# Patient Record
Sex: Female | Born: 1945
Health system: Southern US, Community
[De-identification: ages and names within clinical notes are randomized; demographics above are authoritative.]

## PROBLEM LIST (undated history)

## (undated) DIAGNOSIS — Z9889 Other specified postprocedural states: Secondary | ICD-10-CM

## (undated) DIAGNOSIS — E785 Hyperlipidemia, unspecified: Secondary | ICD-10-CM

## (undated) DIAGNOSIS — E119 Type 2 diabetes mellitus without complications: Secondary | ICD-10-CM

## (undated) DIAGNOSIS — I5032 Chronic diastolic (congestive) heart failure: Secondary | ICD-10-CM

## (undated) DIAGNOSIS — M1990 Unspecified osteoarthritis, unspecified site: Secondary | ICD-10-CM

## (undated) DIAGNOSIS — Z9089 Acquired absence of other organs: Secondary | ICD-10-CM

## (undated) DIAGNOSIS — J9610 Chronic respiratory failure, unspecified whether with hypoxia or hypercapnia: Secondary | ICD-10-CM

## (undated) DIAGNOSIS — G4730 Sleep apnea, unspecified: Secondary | ICD-10-CM

## (undated) DIAGNOSIS — R7302 Impaired glucose tolerance (oral): Secondary | ICD-10-CM

## (undated) DIAGNOSIS — E669 Obesity, unspecified: Secondary | ICD-10-CM

## (undated) DIAGNOSIS — C4490 Unspecified malignant neoplasm of skin, unspecified: Secondary | ICD-10-CM

## (undated) DIAGNOSIS — H269 Unspecified cataract: Secondary | ICD-10-CM

## (undated) DIAGNOSIS — I10 Essential (primary) hypertension: Secondary | ICD-10-CM

## (undated) DIAGNOSIS — I428 Other cardiomyopathies: Secondary | ICD-10-CM

## (undated) DIAGNOSIS — K5790 Diverticulosis of intestine, part unspecified, without perforation or abscess without bleeding: Secondary | ICD-10-CM

## (undated) DIAGNOSIS — J984 Other disorders of lung: Secondary | ICD-10-CM

## (undated) DIAGNOSIS — Z8619 Personal history of other infectious and parasitic diseases: Secondary | ICD-10-CM

## (undated) DIAGNOSIS — R112 Nausea with vomiting, unspecified: Secondary | ICD-10-CM

## (undated) HISTORY — PX: BREAST EXCISIONAL BIOPSY: SUR124

## (undated) HISTORY — DX: Unspecified malignant neoplasm of skin, unspecified: C44.90

## (undated) HISTORY — PX: BREAST SURGERY: SHX581

## (undated) HISTORY — DX: Chronic diastolic (congestive) heart failure: I50.32

## (undated) HISTORY — DX: Other cardiomyopathies: I42.8

## (undated) HISTORY — DX: Chronic respiratory failure, unspecified whether with hypoxia or hypercapnia: J96.10

## (undated) HISTORY — PX: CHOLECYSTECTOMY: SHX55

## (undated) HISTORY — DX: Hyperlipidemia, unspecified: E78.5

## (undated) HISTORY — DX: Impaired glucose tolerance (oral): R73.02

## (undated) HISTORY — DX: Other disorders of lung: J98.4

## (undated) HISTORY — PX: BILATERAL VATS ABLATION: SHX1224

## (undated) HISTORY — DX: Obesity, unspecified: E66.9

## (undated) HISTORY — PX: OTHER SURGICAL HISTORY: SHX169

## (undated) HISTORY — DX: Acquired absence of other organs: Z90.89

## (undated) HISTORY — PX: INDUCED ABORTION: SHX677

## (undated) HISTORY — PX: CARDIAC CATHETERIZATION: SHX172

## (undated) HISTORY — DX: Personal history of other infectious and parasitic diseases: Z86.19

---

## 1973-08-27 HISTORY — PX: TUBAL LIGATION: SHX77

## 1994-08-27 HISTORY — PX: CYSTECTOMY: SUR359

## 1999-05-17 ENCOUNTER — Other Ambulatory Visit: Admission: RE | Admit: 1999-05-17 | Discharge: 1999-05-17 | Payer: Self-pay | Admitting: Family Medicine

## 1999-05-17 ENCOUNTER — Encounter: Payer: Self-pay | Admitting: Family Medicine

## 1999-05-17 LAB — CONVERTED CEMR LAB
Blood Glucose, Fasting: 93 mg/dL
RBC count: 4.36 10*6/uL

## 2000-04-23 ENCOUNTER — Encounter: Payer: Self-pay | Admitting: Family Medicine

## 2000-04-30 ENCOUNTER — Other Ambulatory Visit: Admission: RE | Admit: 2000-04-30 | Discharge: 2000-04-30 | Payer: Self-pay | Admitting: Family Medicine

## 2000-04-30 ENCOUNTER — Encounter: Payer: Self-pay | Admitting: Family Medicine

## 2000-08-27 DIAGNOSIS — I428 Other cardiomyopathies: Secondary | ICD-10-CM

## 2000-08-27 HISTORY — DX: Other cardiomyopathies: I42.8

## 2000-10-22 ENCOUNTER — Encounter: Payer: Self-pay | Admitting: Family Medicine

## 2001-08-12 ENCOUNTER — Encounter: Payer: Self-pay | Admitting: Family Medicine

## 2001-08-18 ENCOUNTER — Other Ambulatory Visit: Admission: RE | Admit: 2001-08-18 | Discharge: 2001-08-18 | Payer: Self-pay | Admitting: Family Medicine

## 2001-08-18 ENCOUNTER — Encounter: Payer: Self-pay | Admitting: Family Medicine

## 2001-08-18 LAB — CONVERTED CEMR LAB: Pap Smear: NORMAL

## 2001-11-04 ENCOUNTER — Ambulatory Visit (HOSPITAL_COMMUNITY): Admission: RE | Admit: 2001-11-04 | Discharge: 2001-11-04 | Payer: Self-pay | Admitting: Cardiology

## 2002-02-24 HISTORY — PX: OTHER SURGICAL HISTORY: SHX169

## 2002-08-21 ENCOUNTER — Encounter: Payer: Self-pay | Admitting: Family Medicine

## 2002-08-21 LAB — CONVERTED CEMR LAB
Blood Glucose, Fasting: 101 mg/dL
TSH: 3.73 microintl units/mL
WBC, blood: 7.3 10*3/uL

## 2002-08-25 ENCOUNTER — Other Ambulatory Visit: Admission: RE | Admit: 2002-08-25 | Discharge: 2002-08-25 | Payer: Self-pay | Admitting: Family Medicine

## 2002-08-25 ENCOUNTER — Encounter: Payer: Self-pay | Admitting: Family Medicine

## 2002-08-25 LAB — CONVERTED CEMR LAB: Pap Smear: NORMAL

## 2002-11-18 ENCOUNTER — Encounter: Payer: Self-pay | Admitting: Family Medicine

## 2002-11-18 LAB — CONVERTED CEMR LAB
Blood Glucose, Fasting: 87 mg/dL
TSH: 3.47 microintl units/mL

## 2003-02-19 ENCOUNTER — Encounter: Payer: Self-pay | Admitting: Family Medicine

## 2003-02-19 LAB — CONVERTED CEMR LAB: Blood Glucose, Fasting: 93 mg/dL

## 2003-08-27 ENCOUNTER — Encounter: Payer: Self-pay | Admitting: Family Medicine

## 2003-08-27 LAB — CONVERTED CEMR LAB
Blood Glucose, Fasting: 90 mg/dL
TSH: 5 microintl units/mL
WBC, blood: 7.4 10*3/uL

## 2003-09-13 ENCOUNTER — Encounter: Payer: Self-pay | Admitting: Family Medicine

## 2003-09-13 ENCOUNTER — Other Ambulatory Visit: Admission: RE | Admit: 2003-09-13 | Discharge: 2003-09-13 | Payer: Self-pay | Admitting: Family Medicine

## 2004-08-08 ENCOUNTER — Ambulatory Visit: Payer: Self-pay | Admitting: Family Medicine

## 2004-08-09 ENCOUNTER — Ambulatory Visit: Payer: Self-pay | Admitting: Family Medicine

## 2004-11-16 ENCOUNTER — Ambulatory Visit: Payer: Self-pay | Admitting: Family Medicine

## 2004-11-16 LAB — CONVERTED CEMR LAB
Blood Glucose, Fasting: 96 mg/dL
Hgb A1c MFr Bld: 5.14 %

## 2004-11-20 ENCOUNTER — Other Ambulatory Visit: Admission: RE | Admit: 2004-11-20 | Discharge: 2004-11-20 | Payer: Self-pay | Admitting: Family Medicine

## 2004-11-20 ENCOUNTER — Ambulatory Visit: Payer: Self-pay | Admitting: Family Medicine

## 2004-12-04 ENCOUNTER — Ambulatory Visit: Payer: Self-pay

## 2004-12-06 ENCOUNTER — Ambulatory Visit: Payer: Self-pay | Admitting: Family Medicine

## 2005-06-28 ENCOUNTER — Ambulatory Visit: Payer: Self-pay | Admitting: Family Medicine

## 2005-08-10 ENCOUNTER — Ambulatory Visit: Payer: Self-pay | Admitting: Family Medicine

## 2005-08-29 ENCOUNTER — Ambulatory Visit: Payer: Self-pay | Admitting: Family Medicine

## 2005-11-13 ENCOUNTER — Encounter: Payer: Self-pay | Admitting: Family Medicine

## 2005-11-13 LAB — CONVERTED CEMR LAB: Pap Smear: NORMAL

## 2005-11-21 ENCOUNTER — Ambulatory Visit: Payer: Self-pay | Admitting: Family Medicine

## 2005-11-21 LAB — CONVERTED CEMR LAB
RBC count: 3.86 10*6/uL
TSH: 3.09 microintl units/mL

## 2005-11-23 ENCOUNTER — Other Ambulatory Visit: Admission: RE | Admit: 2005-11-23 | Discharge: 2005-11-23 | Payer: Self-pay | Admitting: Family Medicine

## 2005-11-23 ENCOUNTER — Ambulatory Visit: Payer: Self-pay | Admitting: Family Medicine

## 2005-11-23 ENCOUNTER — Encounter: Payer: Self-pay | Admitting: Family Medicine

## 2005-12-06 ENCOUNTER — Ambulatory Visit: Payer: Self-pay | Admitting: Family Medicine

## 2005-12-24 ENCOUNTER — Ambulatory Visit: Payer: Self-pay | Admitting: Internal Medicine

## 2006-04-23 ENCOUNTER — Ambulatory Visit: Payer: Self-pay | Admitting: Family Medicine

## 2006-08-22 ENCOUNTER — Ambulatory Visit: Payer: Self-pay | Admitting: Family Medicine

## 2006-09-18 ENCOUNTER — Ambulatory Visit: Payer: Self-pay | Admitting: Family Medicine

## 2006-11-27 ENCOUNTER — Ambulatory Visit: Payer: Self-pay | Admitting: Family Medicine

## 2006-11-27 LAB — CONVERTED CEMR LAB
ALT: 28 units/L (ref 0–40)
Basophils Absolute: 0 10*3/uL (ref 0.0–0.1)
Basophils Relative: 0 % (ref 0.0–1.0)
Blood Glucose, Fasting: 93 mg/dL
CO2: 32 meq/L (ref 19–32)
Calcium: 9.5 mg/dL (ref 8.4–10.5)
Chloride: 105 meq/L (ref 96–112)
Cholesterol: 147 mg/dL (ref 0–200)
Eosinophils Absolute: 0.2 10*3/uL (ref 0.0–0.6)
GFR calc Af Amer: 94 mL/min
Glucose, Bld: 93 mg/dL (ref 70–99)
HDL: 54.9 mg/dL (ref >39.0)
LDL Cholesterol: 54 mg/dL (ref 0–99)
Lymphocytes Relative: 31.4 % (ref 12.0–46.0)
MCHC: 35.6 g/dL (ref 30.0–36.0)
MCV: 93.9 fL (ref 78.0–100.0)
Monocytes Relative: 9 % (ref 3.0–11.0)
Neutrophils Relative %: 57.5 % (ref 43.0–77.0)
Platelets: 298 10*3/uL (ref 150–400)
RDW: 11.9 % (ref 11.5–14.6)
Sodium: 144 meq/L (ref 135–145)
TSH: 4.77 microintl units/mL
TSH: 4.77 microintl units/mL (ref 0.35–5.50)
Total Protein: 7.4 g/dL (ref 6.0–8.3)
Triglycerides: 192 mg/dL — ABNORMAL HIGH (ref 0–149)
VLDL: 38 mg/dL (ref 0–40)

## 2006-12-02 ENCOUNTER — Other Ambulatory Visit: Admission: RE | Admit: 2006-12-02 | Discharge: 2006-12-02 | Payer: Self-pay | Admitting: Family Medicine

## 2006-12-02 ENCOUNTER — Ambulatory Visit: Payer: Self-pay | Admitting: Family Medicine

## 2006-12-02 ENCOUNTER — Encounter: Payer: Self-pay | Admitting: Family Medicine

## 2006-12-09 ENCOUNTER — Ambulatory Visit: Payer: Self-pay | Admitting: Internal Medicine

## 2006-12-16 ENCOUNTER — Encounter: Payer: Self-pay | Admitting: Cardiovascular Disease

## 2006-12-16 ENCOUNTER — Ambulatory Visit: Payer: Self-pay

## 2006-12-20 ENCOUNTER — Ambulatory Visit: Payer: Self-pay | Admitting: Family Medicine

## 2007-05-02 ENCOUNTER — Ambulatory Visit: Payer: Self-pay | Admitting: Family Medicine

## 2007-05-02 DIAGNOSIS — E782 Mixed hyperlipidemia: Secondary | ICD-10-CM | POA: Insufficient documentation

## 2007-05-03 LAB — CONVERTED CEMR LAB
Cholesterol: 164 mg/dL (ref 0–200)
HDL: 60 mg/dL (ref >39.0)

## 2007-07-11 ENCOUNTER — Ambulatory Visit: Payer: Self-pay | Admitting: Family Medicine

## 2007-08-25 ENCOUNTER — Ambulatory Visit: Payer: Self-pay | Admitting: Family Medicine

## 2007-08-27 ENCOUNTER — Encounter: Payer: Self-pay | Admitting: Family Medicine

## 2007-08-27 ENCOUNTER — Encounter (INDEPENDENT_AMBULATORY_CARE_PROVIDER_SITE_OTHER): Payer: Self-pay | Admitting: *Deleted

## 2007-10-03 ENCOUNTER — Ambulatory Visit: Payer: Self-pay | Admitting: Family Medicine

## 2007-10-06 ENCOUNTER — Encounter: Payer: Self-pay | Admitting: Family Medicine

## 2007-10-08 ENCOUNTER — Encounter: Payer: Self-pay | Admitting: Family Medicine

## 2007-10-08 DIAGNOSIS — J301 Allergic rhinitis due to pollen: Secondary | ICD-10-CM | POA: Insufficient documentation

## 2007-10-08 DIAGNOSIS — Z9089 Acquired absence of other organs: Secondary | ICD-10-CM | POA: Insufficient documentation

## 2007-10-08 DIAGNOSIS — I428 Other cardiomyopathies: Secondary | ICD-10-CM | POA: Insufficient documentation

## 2007-10-08 HISTORY — DX: Acquired absence of other organs: Z90.89

## 2007-12-03 ENCOUNTER — Ambulatory Visit: Payer: Self-pay | Admitting: Family Medicine

## 2007-12-03 LAB — CONVERTED CEMR LAB
ALT: 29 units/L (ref 0–35)
AST: 27 units/L (ref 0–37)
Alkaline Phosphatase: 69 units/L (ref 39–117)
BUN: 16 mg/dL (ref 6–23)
Calcium: 9.4 mg/dL (ref 8.4–10.5)
Chloride: 104 meq/L (ref 96–112)
Direct LDL: 77.8 mg/dL
Eosinophils Relative: 2.3 % (ref 0.0–5.0)
Glucose, Bld: 98 mg/dL (ref 70–99)
LDL Cholesterol: 62 mg/dL (ref 0–99)
MCHC: 34.9 g/dL (ref 30.0–36.0)
Monocytes Absolute: 0.7 10*3/uL (ref 0.1–1.0)
Monocytes Relative: 10 % (ref 3.0–12.0)
Neutrophils Relative %: 52.9 % (ref 43.0–77.0)
Platelets: 235 10*3/uL (ref 150–400)
RBC: 4.03 M/uL (ref 3.87–5.11)
RDW: 11.9 % (ref 11.5–14.6)
TSH: 5.29 microintl units/mL (ref 0.35–5.50)
Total Protein: 7.5 g/dL (ref 6.0–8.3)
Triglycerides: 245 mg/dL (ref 0–149)

## 2007-12-08 ENCOUNTER — Other Ambulatory Visit: Admission: RE | Admit: 2007-12-08 | Discharge: 2007-12-08 | Payer: Self-pay | Admitting: Family Medicine

## 2007-12-08 ENCOUNTER — Ambulatory Visit: Payer: Self-pay | Admitting: Family Medicine

## 2007-12-08 ENCOUNTER — Encounter: Payer: Self-pay | Admitting: Family Medicine

## 2007-12-16 ENCOUNTER — Encounter (INDEPENDENT_AMBULATORY_CARE_PROVIDER_SITE_OTHER): Payer: Self-pay | Admitting: *Deleted

## 2007-12-29 ENCOUNTER — Ambulatory Visit: Payer: Self-pay | Admitting: Internal Medicine

## 2008-01-05 ENCOUNTER — Ambulatory Visit: Payer: Self-pay | Admitting: Family Medicine

## 2008-01-05 LAB — CONVERTED CEMR LAB: OCCULT 1: NEGATIVE

## 2008-01-06 ENCOUNTER — Encounter (INDEPENDENT_AMBULATORY_CARE_PROVIDER_SITE_OTHER): Payer: Self-pay | Admitting: *Deleted

## 2008-04-23 ENCOUNTER — Ambulatory Visit: Payer: Self-pay | Admitting: Internal Medicine

## 2008-06-07 ENCOUNTER — Ambulatory Visit: Payer: Self-pay | Admitting: Family Medicine

## 2008-07-30 ENCOUNTER — Telehealth: Payer: Self-pay | Admitting: Family Medicine

## 2008-09-01 ENCOUNTER — Ambulatory Visit: Payer: Self-pay | Admitting: Family Medicine

## 2008-09-01 LAB — CONVERTED CEMR LAB
ALT: 26 units/L (ref 0–35)
AST: 26 units/L (ref 0–37)
Alkaline Phosphatase: 80 units/L (ref 39–117)
Basophils Absolute: 0.1 10*3/uL (ref 0.0–0.1)
Bilirubin, Direct: 0.1 mg/dL (ref 0.0–0.3)
Calcium: 9.4 mg/dL (ref 8.4–10.5)
Chloride: 102 meq/L (ref 96–112)
Creatinine, Ser: 0.6 mg/dL (ref 0.4–1.2)
Eosinophils Absolute: 0.2 10*3/uL (ref 0.0–0.7)
Eosinophils Relative: 2.4 % (ref 0.0–5.0)
MCHC: 34.7 g/dL (ref 30.0–36.0)
Monocytes Absolute: 0.7 10*3/uL (ref 0.1–1.0)
Neutro Abs: 4.4 10*3/uL (ref 1.4–7.7)
Platelets: 254 10*3/uL (ref 150–400)
Sodium: 140 meq/L (ref 135–145)
Total Bilirubin: 0.7 mg/dL (ref 0.3–1.2)
Total CHOL/HDL Ratio: 3.1
Triglycerides: 276 mg/dL (ref 0–149)
Uric Acid, Serum: 8 mg/dL — ABNORMAL HIGH (ref 2.4–7.0)

## 2008-09-02 LAB — CONVERTED CEMR LAB: Vit D, 1,25-Dihydroxy: 28 — ABNORMAL LOW (ref 30–89)

## 2008-09-07 ENCOUNTER — Encounter: Payer: Self-pay | Admitting: Family Medicine

## 2008-09-07 ENCOUNTER — Ambulatory Visit: Payer: Self-pay | Admitting: Family Medicine

## 2008-09-08 ENCOUNTER — Ambulatory Visit: Payer: Self-pay | Admitting: Family Medicine

## 2008-09-08 DIAGNOSIS — R7309 Other abnormal glucose: Secondary | ICD-10-CM | POA: Insufficient documentation

## 2008-09-13 ENCOUNTER — Encounter (INDEPENDENT_AMBULATORY_CARE_PROVIDER_SITE_OTHER): Payer: Self-pay | Admitting: *Deleted

## 2008-12-13 ENCOUNTER — Ambulatory Visit: Payer: Self-pay | Admitting: Family Medicine

## 2008-12-13 LAB — CONVERTED CEMR LAB
Albumin: 3.7 g/dL (ref 3.5–5.2)
CO2: 31 meq/L (ref 19–32)
Calcium: 9.4 mg/dL (ref 8.4–10.5)
Cholesterol: 182 mg/dL (ref 0–200)
Creatinine, Ser: 0.6 mg/dL (ref 0.4–1.2)
Eosinophils Absolute: 0.2 10*3/uL (ref 0.0–0.7)
Eosinophils Relative: 2.6 % (ref 0.0–5.0)
Glucose, Bld: 109 mg/dL — ABNORMAL HIGH (ref 70–99)
HDL: 60.8 mg/dL (ref >39.00)
Lymphocytes Relative: 32.2 % (ref 12.0–46.0)
Lymphs Abs: 2.2 10*3/uL (ref 0.7–4.0)
MCHC: 34.8 g/dL (ref 30.0–36.0)
Microalb Creat Ratio: 2.9 mg/g (ref 0.0–30.0)
Microalb, Ur: 0.1 mg/dL (ref 0.0–1.9)
Monocytes Relative: 9 % (ref 3.0–12.0)
Potassium: 4.3 meq/L (ref 3.5–5.1)
Sodium: 143 meq/L (ref 135–145)
TSH: 4.61 microintl units/mL (ref 0.35–5.50)
Total Bilirubin: 0.6 mg/dL (ref 0.3–1.2)
Total CHOL/HDL Ratio: 3
Triglycerides: 235 mg/dL — ABNORMAL HIGH (ref 0.0–149.0)

## 2008-12-15 ENCOUNTER — Ambulatory Visit: Payer: Self-pay | Admitting: Family Medicine

## 2008-12-15 ENCOUNTER — Other Ambulatory Visit: Admission: RE | Admit: 2008-12-15 | Discharge: 2008-12-15 | Payer: Self-pay | Admitting: Family Medicine

## 2008-12-15 ENCOUNTER — Encounter: Payer: Self-pay | Admitting: Family Medicine

## 2008-12-15 LAB — CONVERTED CEMR LAB: Vit D, 25-Hydroxy: 37 ng/mL (ref 30–89)

## 2008-12-16 ENCOUNTER — Telehealth: Payer: Self-pay | Admitting: Family Medicine

## 2008-12-20 ENCOUNTER — Encounter (INDEPENDENT_AMBULATORY_CARE_PROVIDER_SITE_OTHER): Payer: Self-pay | Admitting: *Deleted

## 2008-12-28 ENCOUNTER — Ambulatory Visit: Payer: Self-pay | Admitting: Family Medicine

## 2008-12-28 LAB — CONVERTED CEMR LAB
OCCULT 1: POSITIVE
OCCULT 3: NEGATIVE

## 2009-01-10 ENCOUNTER — Ambulatory Visit: Payer: Self-pay | Admitting: Internal Medicine

## 2009-01-14 ENCOUNTER — Ambulatory Visit: Payer: Self-pay | Admitting: Family Medicine

## 2009-01-14 LAB — CONVERTED CEMR LAB
OCCULT 1: NEGATIVE
OCCULT 2: NEGATIVE

## 2009-01-17 ENCOUNTER — Encounter (INDEPENDENT_AMBULATORY_CARE_PROVIDER_SITE_OTHER): Payer: Self-pay | Admitting: *Deleted

## 2009-07-12 ENCOUNTER — Ambulatory Visit: Payer: Self-pay | Admitting: Family Medicine

## 2009-08-27 HISTORY — PX: DILATION AND CURETTAGE OF UTERUS: SHX78

## 2009-08-30 ENCOUNTER — Telehealth (INDEPENDENT_AMBULATORY_CARE_PROVIDER_SITE_OTHER): Payer: Self-pay | Admitting: *Deleted

## 2009-09-13 ENCOUNTER — Telehealth: Payer: Self-pay | Admitting: Family Medicine

## 2009-10-06 ENCOUNTER — Encounter: Payer: Self-pay | Admitting: Family Medicine

## 2009-10-06 ENCOUNTER — Ambulatory Visit: Payer: Self-pay | Admitting: Family Medicine

## 2009-10-10 ENCOUNTER — Encounter (INDEPENDENT_AMBULATORY_CARE_PROVIDER_SITE_OTHER): Payer: Self-pay | Admitting: *Deleted

## 2009-11-08 ENCOUNTER — Telehealth: Payer: Self-pay | Admitting: Family Medicine

## 2009-11-18 ENCOUNTER — Telehealth: Payer: Self-pay | Admitting: Family Medicine

## 2009-12-14 ENCOUNTER — Ambulatory Visit: Payer: Self-pay | Admitting: Family Medicine

## 2009-12-14 LAB — CONVERTED CEMR LAB
ALT: 23 units/L (ref 0–35)
Bilirubin, Direct: 0 mg/dL (ref 0.0–0.3)
Calcium: 9.3 mg/dL (ref 8.4–10.5)
Chloride: 103 meq/L (ref 96–112)
Cholesterol: 165 mg/dL (ref 0–200)
Creatinine, Ser: 0.6 mg/dL (ref 0.4–1.2)
GFR calc non Af Amer: 107.37 mL/min (ref >60)
Glucose, Bld: 103 mg/dL — ABNORMAL HIGH (ref 70–99)
HDL: 66.3 mg/dL (ref >39.00)
Sodium: 143 meq/L (ref 135–145)
Total Bilirubin: 0.5 mg/dL (ref 0.3–1.2)
Total Protein: 7.9 g/dL (ref 6.0–8.3)
Triglycerides: 247 mg/dL — ABNORMAL HIGH (ref 0.0–149.0)

## 2009-12-26 ENCOUNTER — Other Ambulatory Visit: Admission: RE | Admit: 2009-12-26 | Discharge: 2009-12-26 | Payer: Self-pay | Admitting: Family Medicine

## 2009-12-26 ENCOUNTER — Ambulatory Visit: Payer: Self-pay | Admitting: Family Medicine

## 2009-12-26 DIAGNOSIS — M109 Gout, unspecified: Secondary | ICD-10-CM | POA: Insufficient documentation

## 2009-12-26 LAB — HM PAP SMEAR

## 2010-01-03 LAB — CONVERTED CEMR LAB: Pap Smear: NEGATIVE

## 2010-01-04 ENCOUNTER — Encounter (INDEPENDENT_AMBULATORY_CARE_PROVIDER_SITE_OTHER): Payer: Self-pay | Admitting: *Deleted

## 2010-01-06 ENCOUNTER — Ambulatory Visit: Payer: Self-pay | Admitting: Family Medicine

## 2010-01-06 LAB — CONVERTED CEMR LAB
OCCULT 1: NEGATIVE
OCCULT 3: NEGATIVE

## 2010-01-09 ENCOUNTER — Encounter (INDEPENDENT_AMBULATORY_CARE_PROVIDER_SITE_OTHER): Payer: Self-pay | Admitting: *Deleted

## 2010-03-06 ENCOUNTER — Ambulatory Visit: Payer: Self-pay | Admitting: Internal Medicine

## 2010-03-20 ENCOUNTER — Ambulatory Visit: Payer: Self-pay | Admitting: Family Medicine

## 2010-03-22 ENCOUNTER — Ambulatory Visit: Payer: Self-pay | Admitting: Obstetrics and Gynecology

## 2010-03-23 ENCOUNTER — Ambulatory Visit (HOSPITAL_COMMUNITY): Admission: RE | Admit: 2010-03-23 | Discharge: 2010-03-23 | Payer: Self-pay | Admitting: Family Medicine

## 2010-04-04 ENCOUNTER — Encounter (INDEPENDENT_AMBULATORY_CARE_PROVIDER_SITE_OTHER): Payer: Self-pay | Admitting: *Deleted

## 2010-04-06 ENCOUNTER — Ambulatory Visit: Payer: Self-pay | Admitting: Obstetrics & Gynecology

## 2010-05-04 ENCOUNTER — Ambulatory Visit (HOSPITAL_COMMUNITY): Admission: RE | Admit: 2010-05-04 | Discharge: 2010-05-04 | Payer: Self-pay | Admitting: Obstetrics & Gynecology

## 2010-05-04 ENCOUNTER — Ambulatory Visit: Payer: Self-pay | Admitting: Obstetrics & Gynecology

## 2010-06-12 ENCOUNTER — Ambulatory Visit: Payer: Self-pay | Admitting: Obstetrics & Gynecology

## 2010-06-13 ENCOUNTER — Telehealth: Payer: Self-pay | Admitting: Family Medicine

## 2010-08-01 ENCOUNTER — Encounter: Payer: Self-pay | Admitting: Family Medicine

## 2010-09-08 ENCOUNTER — Telehealth: Payer: Self-pay | Admitting: Family Medicine

## 2010-09-26 NOTE — Letter (Signed)
Summary: Results Follow up Letter  [place] at [facility]  [address] [place], [place] [address]   Phone: [phone]  Fax: [phone]    01/04/2010 MRN: [id number]  Betty [name] [address] [place], [place]  [address]  Dear Betty [name],  The following are the results of your recent test(s):  Test         Result    Pap Smear:        Normal _X____  Not Normal _____ Comments: Please repeat in one year. ______________________________________________________ Cholesterol: LDL(Bad cholesterol):         Your goal is less than:         HDL (Good cholesterol):       Your goal is more than: Comments:  ______________________________________________________ Mammogram:        Normal _____  Not Normal _____ Comments:  ___________________________________________________________________ Hemoccult:        Normal _____  Not normal _______ Comments:    _____________________________________________________________________ Other Tests:    We routinely do not discuss normal results over the telephone.  If you desire a copy of the results, or you have any questions about this information we can discuss them at your next office visit.   Sincerely,     Laurita Quint, MD

## 2010-09-26 NOTE — Letter (Signed)
Summary: Schaller Retirement letter  [place] [facility] [place]  [address]   [place], [place] [address]   Phone: [phone]  Fax: [phone]       04/04/2010 MRN: [id number]  Mylinh Fortner [address] [place], [place]  [address]  Dear Ms. Ballin,  [facility] [place], and [place] announce the retirement of ROBERT N. SCHALLER, M.D., from full-time practice at the [place] office effective February 23, 2010 and his plans of returning part-time.  It is important to Dr. Schaller and to our practice that you understand that [facility] [place] has seven physicians in our office for your health care needs.  We will continue to offer the same exceptional care that you have today.    Dr. Schaller has spoken to many of you about his plans for retirement and returning part-time in the fall.   We will continue to work with you through the transition to schedule appointments for you in the office and meet the high standards that [facility] is committed to.   Again, it is with great pleasure that we share the news that Dr. Schaller will return to [facility] [place] in October of 2011 with a reduced schedule.    If you have any questions, or would like to request an appointment with one of our physicians, please call us at [phone] and press the option for Scheduling an appointment.  We take pleasure in providing you with excellent patient care and look forward to seeing you at your next office visit.  Our [place] Physicians are:  Richard Letvak, M.D. Robert Schaller, M.D. Marne Tower, M.D. Amy Bedsole, M.D. Spencer Copland, M.D. Talia Aron, M.D. We proudly welcomed Shaw Duncan, M.D. and Javier Gutierrez, M.D. to the practice in July/August 2011.  Sincerely,  [facility] [place] 

## 2010-09-26 NOTE — Assessment & Plan Note (Signed)
Summary: 1 YR F/U   Visit Type:  Follow-up Primary Provider:  Shaune Leeks MD  CC:  no complaints.  History of Present Illness: Ms. Tarter is a 65 year old woman with a hx of nonischemic cardiomyopathy and only mild LV dysfunction.  I last saw her 1 year ago SInce seen she denies chest pain.  Breathing is OK  Current Medications (verified): 1)  Bayer Aspirin Ec Low Dose 81 Mg  Tbec (Aspirin) .Marland Kitchen.. 1 Daily 2)  Klor-Con M10 10 Meq  Tbcr (Potassium Chloride Crys Cr) .Marland Kitchen.. 1 Daily By Mouth 3)  Coreg 12.5 Mg  Tabs (Carvedilol) .Marland Kitchen.. 1 Two Times A Day By Mouth 4)  Simvastatin 20 Mg  Tabs (Simvastatin) .Marland Kitchen.. 1 Tablet Daily By Mouth 5)  Colchicine 0.6 Mg Tabs (Colchicine) .Marland Kitchen.. 1 Two Times A Day To Three Times A Day  As Needed For Gout Attacks 6)  Diovan 160 Mg Tabs (Valsartan) .... One Tab By Mouth Daily 7)  Vitamin D 1000 Unit Caps (Cholecalciferol) .Marland Kitchen.. 1 Daily By Mouth 8)  Allegra 180 Mg Tabs (Fexofenadine Hcl) .Marland Kitchen.. 1 Tablet Once A Day As Needed 9)  Multivitamins   Tabs (Multiple Vitamin) .Marland Kitchen.. 1 Tab Two Times A Day 10)  Fish Oil 1000 Mg Caps (Omega-3 Fatty Acids) .... Take One By Mouth Daily 11)  Calcium Citrate-Vitamin D 200-125 Mg-Unit Tabs (Calcium Citrate-Vitamin D) .... Take One By Mouth Daily 12)  Delsym .... Prn 13)  Mucinex .... Prn  Allergies (verified): 1)  ! Amoxicillin 2)  ! Sulfa  Past History:  Past medical, surgical, family and social histories (including risk factors) reviewed, and no changes noted (except as noted below).  Past Medical History: Reviewed history from 04/23/2008 and no changes required. Hyperlipidemia Cardiomyopathy Gout  Past Surgical History: Reviewed history from 10/08/2007 and no changes required. NSVD X 2 CYSTECTOMY L BREAST (BHATTE) 1996 ECHO---MILD LAE EF 75%:(09/2001) CATH. EF 40% MILD M.R.;GLOBAL HYPOKINESIS (:11/04/2001) DEXA NORMAL:((02/2002) LAPRASCOPIC CHOLEYSTECTOMY, ACUTE (02/2002) ABD. ULTRASOUND--UTERUS AND ENDOMETRIAL  STRIPE NORMAL:(11/26/2003) ECHO TR M.R. ; T.R. EF 45-55%:(12/04/2004)  Family History: Reviewed history from 12/26/2009 and no changes required. Father dec  59 YOA DM; CABG X 5 ;STROKE;COPD ON DEATH CERTIFICATE, MULTIPLE PNEUMONIA  Mother dec 49 YOA LYMPHOMA BROTHER A 61 Meniere's Dz SISTER A 56 DM CV: + PGF; + FATHER CABG X 3 HBP: NEGATIVE DM: + FATHER; +PGM GOUT/ARTHRITIS: PROSTATE CANCER:        LYMPHOMA MOTHER// +MGM RENAL BREAST/OVARIAN/UTERINE/CANCER: COLON CANCER: NEGATIVE DEPRESSION: NEGATIVE ETOH/DRUG ABUSE:  NEGATIVE OTHER: + STROKE FATHER Grandmother had gout  Social History: Reviewed history from 12/15/2008 and no changes required.  WIDOW HUSBAND DECEASED MI PLAYING SOFTBALL REMARRIED   01/2000 Children: 2 CHILDREN OUT OF HOME Occupation: TEACHER RETIRED, works at [employer] Alcohol use-no  Review of Systems       All systems reviewed.  Negatvie to the above problem.  Vital Signs:  Patient profile:   65 year old female Height:      65 inches Weight:      201 pounds Pulse rate:   90 / minute BP sitting:   110 / 73  (left arm) Cuff size:   large  Vitals Entered By: Burnett Kanaris, CNA (March 06, 2010 10:19 AM)  Physical Exam  Additional Exam:  Patient is in NAD HEENT:  Normocephalic, atraumatic. EOMI, PERRLA.  Neck: JVP is normal. No thyromegaly. No bruits.  Lungs: clear to auscultation. No rales no wheezes.  Heart: Regular rate and rhythm. Normal  S1, S2. No S3.   No significant murmurs. PMI not displaced.  Abdomen:  Supple, nontender. Normal bowel sounds. No masses. No hepatomegaly.  Extremities:   Good distal pulses throughout. No lower extremity edema.  Musculoskeletal :moving all extremities.  Neuro:   alert and oriented x3.    EKG  Procedure date:  03/06/2010  Findings:      NSR.  90 bpm.    Impression & Recommendations:  Problem # 1:  CARDIOMYOPATHY (ICD-425.4) Patient with only mild LV dysfunction.  Doing well  I would keep her  on the same regimen.  Problem # 2:  HYPERLIPIDEMIA, MIXED (ICD-272.2) LDL is 72 on recent check; HDL 66.  Trig are 247.  Watch carbs. Continue Zocor.  Patient Instructions: 1)  Continue on current regimen.  Stay active.  F/U in 18 months.

## 2010-09-26 NOTE — Assessment & Plan Note (Signed)
Summary: CHECK UP/CLE   Vital Signs:  Patient profile:   65 year old female Weight:      197.50 pounds BMI:     32.98 Temp:     98.3 degrees F oral Pulse rate:   88 / minute Pulse rhythm:   regular BP sitting:   110 / 68  (left arm) Cuff size:   large  Vitals Entered By: Sydell Axon LPN (Dec 27, 3014 8:22 AM) CC: 30 Minute checkup, hemoccult cards given to patient   History of Present Illness: Pt here for Comp Exam. She feels well and only complaint is hair falling out. She takes one MVI a day and her TSH today is nml.  Preventive Screening-Counseling & Management  Alcohol-Tobacco     Alcohol drinks/day: 0     Smoking Status: never     Passive Smoke Exposure: no  Caffeine-Diet-Exercise     Caffeine use/day: 2     Does Patient Exercise: no  Problems Prior to Update: 1)  Hyperglycemia  (ICD-790.29) 2)  Other Screening Mammogram  (ICD-V76.12) 3)  Gout  (ICD-274.9) 4)  Special Screening Malig Neoplasms Other Sites  (ICD-V76.49) 5)  Health Maintenance Exam  (ICD-V70.0) 6)  Allergic Rhinitis, Seasonal  (ICD-477.0) 7)  Cholecystectomy, Hx of  (ICD-V45.79) 8)  Cardiomyopathy  (ICD-425.4) 9)  Hyperlipidemia, Mixed  (ICD-272.2)  Medications Prior to Update: 1)  Bayer Aspirin Ec Low Dose 81 Mg  Tbec (Aspirin) .Marland Kitchen.. 1 Daily 2)  Klor-Con M10 10 Meq  Tbcr (Potassium Chloride Crys Cr) .Marland Kitchen.. 1 Daily By Mouth 3)  Coreg 12.5 Mg  Tabs (Carvedilol) .Marland Kitchen.. 1 Two Times A Day By Mouth 4)  Fish Oil Maximum Strength 1200 Mg  Caps (Omega-3 Fatty Acids) .Marland Kitchen.. 1 Tablet Daily By Mouth 5)  Simvastatin 20 Mg  Tabs (Simvastatin) .Marland Kitchen.. 1 Tablet Daily By Mouth 6)  Colchicine 0.6 Mg Tabs (Colchicine) .Marland Kitchen.. 1 Two Times A Day To Three Times A Day  As Needed For Gout Attacks 7)  Diovan 160 Mg Tabs (Valsartan) .... One Tab By Mouth Daily 8)  Vitamin D 1000 Unit Caps (Cholecalciferol) .Marland Kitchen.. 1 Daily By Mouth 9)  Allegra 180 Mg Tabs (Fexofenadine Hcl) .Marland Kitchen.. 1 Tablet Once A Day 10)  Calcium Carbonate-Vitamin D  600-400 Mg-Unit  Tabs (Calcium Carbonate-Vitamin D) .Marland Kitchen.. 1 Tab Once Daily 11)  Multivitamins   Tabs (Multiple Vitamin) .Marland Kitchen.. 1 Tab Qd  Allergies: 1)  ! Amoxicillin 2)  ! Sulfa  Family History: Father 87  18 YOA DM; CABG X 5 ;STROKE;COPD ON DEATH CERTIFICATE, MULTIPLE PNEUMONIA  Mother dec 49 YOA LYMPHOMA BROTHER A 61 Meniere's Dz SISTER A 56 DM CV: + PGF; + FATHER CABG X 3 HBP: NEGATIVE DM: + FATHER; +PGM GOUT/ARTHRITIS: PROSTATE CANCER:        LYMPHOMA MOTHER// +MGM RENAL BREAST/OVARIAN/UTERINE/CANCER: COLON CANCER: NEGATIVE DEPRESSION: NEGATIVE ETOH/DRUG ABUSE:  NEGATIVE OTHER: + STROKE FATHER Grandmother had gout  Review of Systems General:  Denies chills, fatigue, fever, loss of appetite, malaise, sleep disorder, sweats, weakness, and weight loss. Eyes:  Denies blurring, discharge, and eye pain. ENT:  Denies decreased hearing, difficulty swallowing, earache, and ringing in ears. CV:  Complains of shortness of breath with exertion; denies chest pain or discomfort, fainting, fatigue, palpitations, swelling of feet, and swelling of hands. Resp:  Complains of wheezing; denies cough and shortness of breath; rare. GI:  Denies abdominal pain, bloody stools, change in bowel habits, constipation, dark tarry stools, diarrhea, indigestion, loss of appetite, nausea, vomiting, vomiting blood,  and yellowish skin color. GU:  Complains of urinary frequency; denies discharge, dysuria, and nocturia. MS:  Complains of joint pain; denies low back pain, muscle aches, cramps, and stiffness. Derm:  Denies dryness, itching, and rash. Neuro:  Complains of numbness; denies poor balance, tingling, tremors, and weakness; occas thigh for secs..  Physical Exam  General:  Well-developed,well-nourished,in no acute distress; alert,appropriate and cooperative throughout examination, mildly obese. Head:  Normocephalic and atraumatic without obvious abnormalities. No apparent alopecia or balding. Sinuses  NT. Eyes:  Conjunctiva clear bilaterally.  Ears:  External ear exam shows no significant lesions or deformities.  Otoscopic examination reveals clear canals, tympanic membranes are intact bilaterally without bulging, retraction, inflammation or discharge. Hearing is grossly normal bilaterally. Nose:  External nasal examination shows no deformity or inflammation. Nasal mucosa are pink and moist without lesions or exudates. Mouth:  Oral mucosa and oropharynx without lesions or exudates.  Teeth in good repair. Neck:  No deformities, masses, or tenderness noted. Chest Wall:  No deformities, masses, or tenderness noted. Breasts:  No mass, nodules, thickening, tenderness, bulging, retraction, inflamation, nipple discharge or skin changes noted.   Lungs:  Normal respiratory effort, chest expands symmetrically. Lungs are clear to auscultation, no crackles or wheezes. Heart:  Normal rate and regular rhythm. S1 and S2 normal without gallop, murmur, click, rub or other extra sounds. Abdomen:  Bowel sounds positive,abdomen soft and non-tender without masses, organomegaly or hernias noted. Mildly protuberant. Rectal:  No external abnormalities noted. Normal sphincter tone. No rectal masses or tenderness. G neg. Genitalia:  Pelvic Exam:        External: normal female genitalia without lesions or masses        Vagina: normal without lesions or masses        Cervix: normal without lesions or masses        Adnexa: normal bimanual exam without masses or fullness        Uterus: normal by palpation        Pap smear: performed Msk:  No deformity or scoliosis noted of thoracic or lumbar spine.   Pulses:  R and L carotid,radial,femoral,dorsalis pedis and posterior tibial pulses are full and equal bilaterally Extremities:  R knee with mild hematoma and effusion in the bursa. Neurologic:  No cranial nerve deficits noted. Station and gait are normal. Sensory, motor and coordinative functions appear intact. Skin:   Intact without suspicious lesions or rashes Cervical Nodes:  No lymphadenopathy noted Axillary Nodes:  No palpable lymphadenopathy Inguinal Nodes:  No significant adenopathy Psych:  Cognition and judgment appear intact. Alert and cooperative with normal attention span and concentration. No apparent delusions, illusions, hallucinations   Impression & Recommendations:  Problem # 1:  HEALTH MAINTENANCE EXAM (ICD-V70.0) Assessment Comment Only Discussed labs, avoiding sweetys and carbs, lifestyle and immun...discussed Zostavax. Here for Pap, mammo UTD.  Problem # 2:  HYPERGLYCEMIA (ICD-790.29) Assessment: Unchanged  Avoid sweets and carbs. Kidney fine.  Labs Reviewed: Creat: 0.6 (12/14/2009)     Problem # 3:  GOUT (ICD-274.9) Assessment: Unchanged Uric acid stable. Better off diuretic. Her updated medication list for this problem includes:    Colchicine 0.6 Mg Tabs (Colchicine) .Marland Kitchen... 1 two times a day to three times a day  as needed for gout attacks  Problem # 4:  ALLERGIC RHINITIS, SEASONAL (ICD-477.0) Assessment: Unchanged Stable.  Problem # 5:  HYPERLIPIDEMIA, MIXED (ICD-272.2) Assessment: Unchanged Good control. COnt Simvastatin Her updated medication list for this problem includes:    Simvastatin 20 Mg Tabs (  Simvastatin) .Marland Kitchen... 1 tablet daily by mouth  Labs Reviewed: SGOT: 22 (12/14/2009)   SGPT: 23 (12/14/2009)   HDL:66.30 (12/14/2009), 60.80 (12/13/2008)  LDL:DEL (09/01/2008), 62 (16/05/9603)  Chol:165 (12/14/2009), 182 (12/13/2008)  Trig:247.0 (12/14/2009), 235.0 (12/13/2008)  Complete Medication List: 1)  Bayer Aspirin Ec Low Dose 81 Mg Tbec (Aspirin) .Marland Kitchen.. 1 daily 2)  Klor-con M10 10 Meq Tbcr (Potassium chloride crys cr) .Marland Kitchen.. 1 daily by mouth 3)  Coreg 12.5 Mg Tabs (Carvedilol) .Marland Kitchen.. 1 two times a day by mouth 4)  Simvastatin 20 Mg Tabs (Simvastatin) .Marland Kitchen.. 1 tablet daily by mouth 5)  Colchicine 0.6 Mg Tabs (Colchicine) .Marland Kitchen.. 1 two times a day to three times a day  as  needed for gout attacks 6)  Diovan 160 Mg Tabs (Valsartan) .... One tab by mouth daily 7)  Vitamin D 1000 Unit Caps (Cholecalciferol) .Marland Kitchen.. 1 daily by mouth 8)  Allegra 180 Mg Tabs (Fexofenadine hcl) .Marland Kitchen.. 1 tablet once a day as needed 9)  Multivitamins Tabs (Multiple vitamin) .Marland Kitchen.. 1 tab daily 10)  Fish Oil 1000 Mg Caps (Omega-3 fatty acids) .... Take one by mouth daily 11)  Calcium Citrate-vitamin D 200-125 Mg-unit Tabs (Calcium citrate-vitamin d) .... Take one by mouth daily  Patient Instructions: 1)  RTC one year, sooner as needed.  Current Allergies (reviewed today): ! AMOXICILLIN ! SULFA

## 2010-09-26 NOTE — Progress Notes (Signed)
Summary: Rx Coreg  Phone Note Refill Request Message from:  Patient on November 08, 2009 4:50 PM  Refills Requested: Medication #1:  COREG 12.5 MG  TABS 1 two times a day by mouth   Supply Requested: 3 months Patient request written Rx to be mailed to [facility].  90 day supply.   Method Requested: Fax to Mail Away Pharmacy Initial call taken by: Linde Gillis CMA Duncan Dull),  November 08, 2009 4:52 PM    Prescriptions: COREG 12.5 MG  TABS (CARVEDILOL) 1 two times a day by mouth  #180 x 4   Entered and Authorized by:   Shaune Leeks MD   Signed by:   Shaune Leeks MD on 11/08/2009   Method used:   Electronically to        [facility]* (mail-order)             ,          Ph: [phone]       Fax: [phone]   RxID:   [id number]

## 2010-09-26 NOTE — Progress Notes (Signed)
Summary: wants order for zostavax  Phone Note Call from Patient Call back at Home Phone [phone]   Caller: Patient Call For: Shaune Leeks MD Summary of Call: Pt is asking for order for zostavax to be given at [facility] pharmacy.  It is more convenient for her to go there.  She has checked with her insurance company, and they will pay. Initial call taken by: Lowella Petties CMA,  June 13, 2010 4:45 PM    New/Updated Medications: ZOSTAVAX 81829 UNT/0.65ML SOLR (ZOSTER VACCINE LIVE) pls administer one dose. Prescriptions: ZOSTAVAX 93716 UNT/0.65ML SOLR (ZOSTER VACCINE LIVE) pls administer one dose.  #1 x 0   Entered and Authorized by:   Shaune Leeks MD   Signed by:   Shaune Leeks MD on 06/13/2010   Method used:   Electronically to        [facility]* (retail)       [address]       [place], [place]  [address]       Ph: [phone]       Fax: [phone]   RxID:   [id number]

## 2010-09-26 NOTE — Progress Notes (Signed)
Summary: Need order for Mammogram  Phone Note Call from Patient   Summary of Call: Pt called need order  for yearly mammogram, to [facility]... Pt call back # [phone].Marland KitchenLurleen Soltero  September 13, 2009 3:53 PM  Initial call taken by: Daine Gip,  September 13, 2009 3:53 PM  Follow-up for Phone Call        Done. Follow-up by: Shaune Leeks MD,  September 13, 2009 6:35 PM  Additional Follow-up for Phone Call Additional follow up Details #1::        MMGset up at [facility] on 10/06/2009 at 10:45am. order mailed to the patient. Additional Follow-up by: Carlton Adam,  September 16, 2009 8:48 AM

## 2010-09-26 NOTE — Letter (Signed)
Summary: Nadara Eaton letter  [place] [facility]  [address] [place], [place] [address]   Phone: [phone]  Fax: [phone]       04/04/2010 MRN: [id number]  Betty [name] [address] [place], [place]  [address]  Dear [name],  [facility] [place], and [facility] announce the retirement of Arta Silence, M.D., from full-time practice at the [facility] office effective February 23, 2010 and his plans of returning part-time.  It is important to Dr. Hetty Ely and to our practice that you understand that [facility] has seven physicians in our office for your health care needs.  We will continue to offer the same exceptional care that you have today.    Dr. Hetty Ely has spoken to many of you about his plans for retirement and returning part-time in the fall.   We will continue to work with you through the transition to schedule appointments for you in the office and meet the high standards that [facility] is committed to.   Again, it is with great pleasure that we share the news that Dr. Hetty Ely will return to [place] [facility] in October of 2011 with a reduced schedule.    If you have any questions, or would like to request an appointment with one of our physicians, please call us at [phone] and press the option for Scheduling an appointment.  We take pleasure in providing you with excellent patient care and look forward to seeing you at your next office visit.  Our [facility] Physicians are:  Tillman Abide, M.D. Laurita Quint, M.D. Roxy Manns, M.D. Kerby Nora, M.D. Hannah Beat, M.D. Ruthe Mannan, M.D. We proudly welcomed Raechel Ache, M.D. and Eustaquio Boyden, M.D. to the practice in July/August 2011.  Sincerely,  [facility]

## 2010-09-26 NOTE — Progress Notes (Signed)
Summary: Mammogram  Phone Note Call from Patient Call back at Home Phone [phone]   Caller: Patient Call For: Daine Gip Summary of Call: pt needs to set up appt for mammogram Initial call taken by: DeShannon Smith CMA Duncan Dull),  August 30, 2009 4:56 PM  Follow-up for Phone Call        Calling pt w/ appt information.Betty [name]  September 01, 2009 2:15 PM  Follow-up by: Daine Gip,  September 01, 2009 2:15 PM

## 2010-09-26 NOTE — Miscellaneous (Signed)
Summary: Zostavax  Clinical Lists Changes  Observations: Added new observation of ZOSTAVAX: Zostavax (06/21/2010 9:05)      Other Immunization History:    Zostavax # 1:  Zostavax (06/21/2010) Received form from [facility], [place], [place]

## 2010-09-26 NOTE — Letter (Signed)
Summary: Results Follow up Letter  [place] [facility]  [address] [place], [place] [address]   Phone: [phone]  Fax: [phone]    01/09/2010 MRN: [id number]  TAPANGA OTTAWAY [address] [place], [place]  [address]  Dear [name],  The following are the results of your recent test(s):  Test         Result    Pap Smear:        Normal _____  Not Normal _____ Comments: ______________________________________________________ Cholesterol: LDL(Bad cholesterol):         Your goal is less than:         HDL (Good cholesterol):       Your goal is more than: Comments:  ______________________________________________________ Mammogram:        Normal _____  Not Normal _____ Comments:  ___________________________________________________________________ Hemoccult:        Normal __X___  Not normal _______ Comments:  Please repeat in one year.  _____________________________________________________________________ Other Tests:    We routinely do not discuss normal results over the telephone.  If you desire a copy of the results, or you have any questions about this information we can discuss them at your next office visit.   Sincerely,     Laurita Quint, MD

## 2010-09-26 NOTE — Progress Notes (Signed)
Summary: clarification on klor con  Phone Note From Pharmacy   Caller: [facility] Summary of Call: Form from [facility] asking for clarification on Klor Con is on your shelf. Initial call taken by: Lowella Petties CMA,  November 18, 2009 8:55 AM  Follow-up for Phone Call        Form completed by Dr. Hetty Ely to be faxed back to [facility].  Form faxed to pharmacy as instructed. Follow-up by: Sydell Axon LPN,  November 21, 2009 10:17 AM

## 2010-09-26 NOTE — Assessment & Plan Note (Signed)
Summary: VAGINAL BLEEDING/CLE   Vital Signs:  Patient profile:   65 year old female Height:      65 inches Weight:      203.13 pounds BMI:     33.92 Temp:     98.4 degrees F oral Pulse rate:   80 / minute Pulse rhythm:   regular BP sitting:   110 / 72  (left arm) Cuff size:   large  Vitals Entered By: Linde Gillis CMA Duncan Dull) (March 20, 2010 3:15 PM) CC: vaginal bleeding   History of Present Illness: 65 yo here for post menopausal bleeding.  She has been menopausal for approx 6 years, was on HRT years ago not on anything now.  Two months ago, developped some brown discharge, now bright red blood. Spots for a few days every couple of weeks. No pain, vaginal or vulvular irritation. No fevers, nights sweats or weight loss.  No family h/o uterine, ovarian or cervical cancer.  Pt had an normal pap smear in 12/2009.  Current Medications (verified): 1)  Bayer Aspirin Ec Low Dose 81 Mg  Tbec (Aspirin) .Marland Kitchen.. 1 Daily 2)  Klor-Con M10 10 Meq  Tbcr (Potassium Chloride Crys Cr) .Marland Kitchen.. 1 Daily By Mouth 3)  Coreg 12.5 Mg  Tabs (Carvedilol) .Marland Kitchen.. 1 Two Times A Day By Mouth 4)  Simvastatin 20 Mg  Tabs (Simvastatin) .Marland Kitchen.. 1 Tablet Daily By Mouth 5)  Colchicine 0.6 Mg Tabs (Colchicine) .Marland Kitchen.. 1 Two Times A Day To Three Times A Day  As Needed For Gout Attacks 6)  Diovan 160 Mg Tabs (Valsartan) .... One Tab By Mouth Daily 7)  Vitamin D 1000 Unit Caps (Cholecalciferol) .Marland Kitchen.. 1 Daily By Mouth 8)  Allegra 180 Mg Tabs (Fexofenadine Hcl) .Marland Kitchen.. 1 Tablet Once A Day As Needed 9)  Multivitamins   Tabs (Multiple Vitamin) .Marland Kitchen.. 1 Tab Two Times A Day 10)  Fish Oil 1000 Mg Caps (Omega-3 Fatty Acids) .... Take One By Mouth Daily 11)  Calcium Citrate-Vitamin D 200-125 Mg-Unit Tabs (Calcium Citrate-Vitamin D) .... Take One By Mouth Daily 12)  Delsym .... Prn 13)  Mucinex .... Prn  Allergies: 1)  ! Amoxicillin 2)  ! Sulfa  Past History:  Past Medical History: Last updated:  04/23/2008 Hyperlipidemia Cardiomyopathy Gout  Past Surgical History: Last updated: 10/08/2007 NSVD X 2 CYSTECTOMY L BREAST ([facility]TE) 1996 ECHO---MILD LAE EF 75%:(09/2001) CATH. EF 40% MILD M.R.;GLOBAL HYPOKINESIS (:11/04/2001) DEXA NORMAL:((02/2002) LAPRASCOPIC CHOLEYSTECTOMY, ACUTE (02/2002) ABD. ULTRASOUND--UTERUS AND ENDOMETRIAL STRIPE NORMAL:(11/26/2003) ECHO TR M.R. ; T.R. EF 45-55%:(12/04/2004)  Family History: Last updated: 12/26/2009 Father dec  61 YOA DM; CABG X 5 ;STROKE;COPD ON DEATH CERTIFICATE, MULTIPLE PNEUMONIA  Mother dec 49 YOA LYMPHOMA BROTHER A 61 Meniere's Dz SISTER A 56 DM CV: + PGF; + FATHER CABG X 3 HBP: NEGATIVE DM: + FATHER; +PGM GOUT/ARTHRITIS: PROSTATE CANCER:        LYMPHOMA MOTHER// +MGM RENAL BREAST/OVARIAN/UTERINE/CANCER: COLON CANCER: NEGATIVE DEPRESSION: NEGATIVE ETOH/DRUG ABUSE:  NEGATIVE OTHER: + STROKE FATHER Grandmother had gout  Social History: Last updated: 2008-12-28  WIDOW HUSBAND DECEASED MI PLAYING SOFTBALL REMARRIED   01/2000 Children: 2 CHILDREN OUT OF HOME Occupation: TEACHER RETIRED, works at [employer] Alcohol use-no  Risk Factors: Alcohol Use: 0 (12/26/2009) Caffeine Use: 2 (12/26/2009) Exercise: no (12/26/2009)  Risk Factors: Smoking Status: never (12/26/2009) Passive Smoke Exposure: no (12/26/2009)  Review of Systems      See HPI General:  Denies fatigue, fever, weakness, and weight loss. GU:  Complains of abnormal vaginal bleeding; denies discharge,  dysuria, hematuria, incontinence, urinary frequency, and urinary hesitancy.  Physical Exam  General:  Well-developed,well-nourished,in no acute distress; alert,appropriate and cooperative throughout examination, mildly obese. Genitalia:  Pelvic Exam:        External: normal female genitalia without lesions or masses        Vagina: normal without lesions or masses        Cervix: normal without lesions or masses        Adnexa: normal bimanual exam without  masses or fullness        Uterus: normal by palpation       Psych:  Cognition and judgment appear intact. Alert and cooperative with normal attention span and concentration. No apparent delusions, illusions, hallucinations   Impression & Recommendations:  Problem # 1:  POSTMENOPAUSAL BLEEDING (ICD-627.1) Assessment New Most likely endometrial atrophy vs endometrial polyps, less likely endometrial CA.  Will refer to gynecology for further work up as we do not have supplies here to perform endometrial biopsy.  Orders: Gynecologic Referral (Gyn)  Complete Medication List: 1)  Bayer Aspirin Ec Low Dose 81 Mg Tbec (Aspirin) .Marland Kitchen.. 1 daily 2)  Klor-con M10 10 Meq Tbcr (Potassium chloride crys cr) .Marland Kitchen.. 1 daily by mouth 3)  Coreg 12.5 Mg Tabs (Carvedilol) .Marland Kitchen.. 1 two times a day by mouth 4)  Simvastatin 20 Mg Tabs (Simvastatin) .Marland Kitchen.. 1 tablet daily by mouth 5)  Colchicine 0.6 Mg Tabs (Colchicine) .Marland Kitchen.. 1 two times a day to three times a day  as needed for gout attacks 6)  Diovan 160 Mg Tabs (Valsartan) .... One tab by mouth daily 7)  Vitamin D 1000 Unit Caps (Cholecalciferol) .Marland Kitchen.. 1 daily by mouth 8)  Allegra 180 Mg Tabs (Fexofenadine hcl) .Marland Kitchen.. 1 tablet once a day as needed 9)  Multivitamins Tabs (Multiple vitamin) .Marland Kitchen.. 1 tab two times a day 10)  Fish Oil 1000 Mg Caps (Omega-3 fatty acids) .... Take one by mouth daily 11)  Calcium Citrate-vitamin D 200-125 Mg-unit Tabs (Calcium citrate-vitamin d) .... Take one by mouth daily 12)  Delsym  .... Prn 13)  Mucinex  .... Prn  Patient Instructions: 1)  Please stop by to see Beretta on your way out.  Current Allergies (reviewed today): ! AMOXICILLIN ! SULFA

## 2010-09-26 NOTE — Letter (Signed)
Summary: Results Follow up Letter  [place] [facility]  [address] [place], [place] [address]   Phone: [phone]  Fax: [phone]    10/10/2009 MRN: [id number]  Betty [name] [address] [place], [place]  [address]  Dear [name],  The following are the results of your recent test(s):  Test         Result    Pap Smear:        Normal _____  Not Normal _____ Comments: ______________________________________________________ Cholesterol: LDL(Bad cholesterol):         Your goal is less than:         HDL (Good cholesterol):       Your goal is more than: Comments:  ______________________________________________________ Mammogram:        Normal _X____  Not Normal _____ Comments: Please repeat in one year.  ___________________________________________________________________ Hemoccult:        Normal _____  Not normal _______ Comments:    _____________________________________________________________________ Other Tests:    We routinely do not discuss normal results over the telephone.  If you desire a copy of the results, or you have any questions about this information we can discuss them at your next office visit.   Sincerely,     Laurita Quint

## 2010-09-28 NOTE — Progress Notes (Signed)
Summary: order for mammogram   Phone Note Call from Patient Call back at Home Phone [phone]   Caller: Patient Call For: Shaune Leeks MD Summary of Call: Patient is asking for an order for her yearly mammogram be sent to the [facility]. She will schedule her own appt.  Initial call taken by: Melody Comas,  September 08, 2010 10:25 AM  Follow-up for Phone Call        Order done. Follow-up by: Shaune Leeks MD,  September 08, 2010 1:32 PM  Additional Follow-up for Phone Call Additional follow up Details #1::        Order faxed and mailed to the pt.  Additional Follow-up by: Carlton Adam,  September 11, 2010 12:36 PM

## 2010-10-05 ENCOUNTER — Encounter: Payer: Self-pay | Admitting: Family Medicine

## 2010-10-05 ENCOUNTER — Ambulatory Visit (INDEPENDENT_AMBULATORY_CARE_PROVIDER_SITE_OTHER): Payer: BC Managed Care – PPO | Admitting: Family Medicine

## 2010-10-05 DIAGNOSIS — J209 Acute bronchitis, unspecified: Secondary | ICD-10-CM

## 2010-10-12 NOTE — Assessment & Plan Note (Signed)
Summary: DRAINAGE,COUGH/CLE   BCBS   Vital Signs:  Patient profile:   65 year old female Weight:      206 pounds Temp:     98.6 degrees F oral Pulse rate:   88 / minute Pulse rhythm:   regular BP sitting:   118 / 66  (left arm) Cuff size:   large  Vitals Entered By: Sydell Axon LPN (October 05, 2010 10:30 AM) CC: Sinus drainage and cough   History of Present Illness: Pt here for congestion. She had vaginal surgery late last year for vaginal bleeding with a polypectomy and D&C. She has had no bleeding since.  She has not had chills or fever, felt warm to her husband. She has no headache, she has had some lightheadedness. No ear pain. She has had rhinitis that is green and some that has been clear. She denies ST. She has cough that is mildly productive of green occas, some clear. Trouble bringing it up. She has been sleeping in a chair, coughs if laying down. No N/V, no diarrhea. She has been taking Mucinex.   Problems Prior to Update: 1)  Postmenopausal Bleeding  (ICD-627.1) 2)  Hyperglycemia  (ICD-790.29) 3)  Other Screening Mammogram  (ICD-V76.12) 4)  Gout  (ICD-274.9) 5)  Special Screening Malig Neoplasms Other Sites  (ICD-V76.49) 6)  Health Maintenance Exam  (ICD-V70.0) 7)  Allergic Rhinitis, Seasonal  (ICD-477.0) 8)  Cholecystectomy, Hx of  (ICD-V45.79) 9)  Cardiomyopathy  (ICD-425.4) 10)  Hyperlipidemia, Mixed  (ICD-272.2)  Medications Prior to Update: 1)  Bayer Aspirin Ec Low Dose 81 Mg  Tbec (Aspirin) .Marland Kitchen.. 1 Daily 2)  Klor-Con M10 10 Meq  Tbcr (Potassium Chloride Crys Cr) .Marland Kitchen.. 1 Daily By Mouth 3)  Coreg 12.5 Mg  Tabs (Carvedilol) .Marland Kitchen.. 1 Two Times A Day By Mouth 4)  Simvastatin 20 Mg  Tabs (Simvastatin) .Marland Kitchen.. 1 Tablet Daily By Mouth 5)  Colchicine 0.6 Mg Tabs (Colchicine) .Marland Kitchen.. 1 Two Times A Day To Three Times A Day  As Needed For Gout Attacks 6)  Diovan 160 Mg Tabs (Valsartan) .... One Tab By Mouth Daily 7)  Vitamin D 1000 Unit Caps (Cholecalciferol) .Marland Kitchen.. 1 Daily By  Mouth 8)  Allegra 180 Mg Tabs (Fexofenadine Hcl) .Marland Kitchen.. 1 Tablet Once A Day As Needed 9)  Multivitamins   Tabs (Multiple Vitamin) .Marland Kitchen.. 1 Tab Two Times A Day 10)  Fish Oil 1000 Mg Caps (Omega-3 Fatty Acids) .... Take One By Mouth Daily 11)  Calcium Citrate-Vitamin D 200-125 Mg-Unit Tabs (Calcium Citrate-Vitamin D) .... Take One By Mouth Daily 12)  Delsym .... Prn 13)  Mucinex .... Prn 14)  Zostavax 16109 Unt/0.71ml Solr (Zoster Vaccine Live) .... Pls Administer One Dose.  Allergies: 1)  ! Amoxicillin 2)  ! Sulfa  Physical Exam  General:  Well-developed,well-nourished,in no acute distress; alert,appropriate and cooperative throughout examination, mildly obese, reasonably clear until she coughs.. Head:  Normocephalic and atraumatic without obvious abnormalities. No apparent alopecia or balding. Sinuses NT. Eyes:  Conjunctiva clear bilaterally.  Ears:  External ear exam shows no significant lesions or deformities.  Otoscopic examination reveals clear canals, tympanic membranes are intact bilaterally without bulging, retraction, inflammation or discharge. Hearing is grossly normal bilaterally. Nose:  External nasal examination shows no deformity or inflammation. Nasal mucosa are pink and moist without lesions or exudates. Mouth:  Oral mucosa and oropharynx without lesions or exudates.  Teeth in good repair. Neck:  No deformities, masses, or tenderness noted. Lungs:  Ronchi with deep breathing  and wet cough throughout. Heart:  Normal rate and regular rhythm. S1 and S2 normal without gallop, murmur, click, rub or other extra sounds.   Impression & Recommendations:  Problem # 1:  BRONCHITIS- ACUTE (ICD-466.0) Assessment New  See instructions. Her updated medication list for this problem includes:    Zithromax Z-pak 250 Mg Tabs (Azithromycin) .Marland Kitchen... As dir    Tussionex Pennkinetic Er 10-8 Mg/75ml Lqcr (Hydrocod polst-chlorphen polst) ..... One tsp by mouth mat night for cough as needed.  Take  antibiotics and other medications as directed. Encouraged to push clear liquids, get enough rest, and take acetaminophen as needed. To be seen in 5-7 days if no improvement, sooner if worse.  Complete Medication List: 1)  Bayer Aspirin Ec Low Dose 81 Mg Tbec (Aspirin) .Marland Kitchen.. 1 daily 2)  Klor-con M10 10 Meq Tbcr (Potassium chloride crys cr) .Marland Kitchen.. 1 daily by mouth 3)  Coreg 12.5 Mg Tabs (Carvedilol) .Marland Kitchen.. 1 two times a day by mouth 4)  Simvastatin 20 Mg Tabs (Simvastatin) .Marland Kitchen.. 1 tablet daily by mouth 5)  Colchicine 0.6 Mg Tabs (Colchicine) .Marland Kitchen.. 1 two times a day to three times a day  as needed for gout attacks 6)  Diovan 160 Mg Tabs (Valsartan) .... One tab by mouth daily 7)  Vitamin D 1000 Unit Caps (Cholecalciferol) .Marland Kitchen.. 1 daily by mouth 8)  Allegra 180 Mg Tabs (Fexofenadine hcl) .Marland Kitchen.. 1 tablet once a day as needed 9)  Multivitamins Tabs (Multiple vitamin) .Marland Kitchen.. 1 tab two times a day 10)  Fish Oil 1000 Mg Caps (Omega-3 fatty acids) .... Take one by mouth daily 11)  Calcium Citrate-vitamin D 200-125 Mg-unit Tabs (Calcium citrate-vitamin d) .... Take one by mouth daily 12)  Delsym  .... Prn 13)  Mucinex  .... Prn 14)  Zithromax Z-pak 250 Mg Tabs (Azithromycin) .... As dir 15)  Tussionex Pennkinetic Er 10-8 Mg/48ml Lqcr (Hydrocod polst-chlorphen polst) .... One tsp by mouth mat night for cough as needed.  Patient Instructions: 1)  Take Zithro. 2)  Take Guaifenesin by [facility] or [facility] and getting MUCOUS RELIEF EXPECTORANT (400mg ), take 11/2 tabs by mouth AM and NOON. 3)  Drink lots of fluids anytime taking Guaifenesin.  4)  Take Delsym as needed. 5)  Take Tussionex at night. Prescriptions: TUSSIONEX PENNKINETIC ER 10-8 MG/5ML LQCR (HYDROCOD POLST-CHLORPHEN POLST) one tsp by mouth mat night for cough as needed.  #4 oz x 0   Entered and Authorized by:   Shaune Leeks MD   Signed by:   Shaune Leeks MD on 10/05/2010   Method used:   Print then Give to  Patient   RxID:   [id number] Christena Deem Z-PAK 250 MG TABS (AZITHROMYCIN) as dir  #1 pak x 0   Entered and Authorized by:   Shaune Leeks MD   Signed by:   Shaune Leeks MD on 10/05/2010   Method used:   [facility]        [id number]       [address] [place], [place]  [address]       Ph: [phone] or [phone]       Fax: [phone]   RxID:   [id number]    Orders Added: 1)  Est. Patient Level III [87564]    Current Allergies (reviewed today): ! AMOXICILLIN ! SULFA

## 2010-10-13 ENCOUNTER — Telehealth: Payer: Self-pay | Admitting: Family Medicine

## 2010-10-13 ENCOUNTER — Encounter: Payer: Self-pay | Admitting: Family Medicine

## 2010-10-13 ENCOUNTER — Ambulatory Visit (INDEPENDENT_AMBULATORY_CARE_PROVIDER_SITE_OTHER): Payer: BC Managed Care – PPO | Admitting: Family Medicine

## 2010-10-13 DIAGNOSIS — J209 Acute bronchitis, unspecified: Secondary | ICD-10-CM

## 2010-10-16 ENCOUNTER — Ambulatory Visit: Payer: Self-pay | Admitting: Family Medicine

## 2010-10-16 ENCOUNTER — Encounter: Payer: Self-pay | Admitting: Family Medicine

## 2010-10-16 LAB — HM MAMMOGRAPHY: HM Mammogram: NORMAL

## 2010-10-17 ENCOUNTER — Encounter (INDEPENDENT_AMBULATORY_CARE_PROVIDER_SITE_OTHER): Payer: Self-pay | Admitting: *Deleted

## 2010-10-18 NOTE — Assessment & Plan Note (Signed)
Summary: COUGH,CONGESTION/CLE   [employer]BS   Vital Signs:  Patient profile:   65 year old female Height:      65 inches Weight:      205 pounds BMI:     34.24 Temp:     98.3 degrees F oral Pulse rate:   84 / minute Pulse rhythm:   regular BP sitting:   116 / 64  (left arm) Cuff size:   large  Vitals Entered By: Delilah Shan CMA [facility] Dull) (October 13, 2010 2:18 PM) CC: Cough, congestion   History of Present Illness: Seen on the 9th of this month and was on zmax.  Since last OV it "sounded like a flock of geese."  This is some better but cough is still there.  Less sputum.  No fevers.  Rhinorrhea is some better, some blood flecks noted.  Had still have some post nasal gtt.  No ear pain.  Voice change noted by patient, recent change.  Back at work this week, able to lay supine at night now with help from tussionex.    Allergies: 1)  ! Amoxicillin 2)  ! Sulfa  Review of Systems       See HPI.  Otherwise negative.    Physical Exam  General:  no apparent distress normocephalic atraumatic tm w/o erythema bilaterally nasal epithelium wiht minimal injection op w/o erythema neck supple w/o la  regular rate and rhythm clear to auscultation bilaterally w/o ronchi, occ cough noted ext well perfused   Impression & Recommendations:  Problem # 1:  BRONCHITIS- ACUTE (ICD-466.0) Assessment Improved resolving. cough will likely persist for days/weeks and gradually improve.  nontoxic.  no fever and lungs clear to auscultation bilaterally .  no need for more antibiotics.  rest, fluids, cough meds with sedation caution.  follow up as needed.   The following medications were removed from the medication list:    Zithromax Z-pak 250 Mg Tabs (Azithromycin) .Marland Kitchen... As dir Her updated medication list for this problem includes:    Tussionex Pennkinetic Er 10-8 Mg/11ml Lqcr (Hydrocod polst-chlorphen polst) ..... One tsp by mouth mat night for cough as needed.  Complete Medication List: 1)  Bayer  Aspirin Ec Low Dose 81 Mg Tbec (Aspirin) .Marland Kitchen.. 1 daily 2)  Klor-con M10 10 Meq Tbcr (Potassium chloride crys cr) .Marland Kitchen.. 1 daily by mouth 3)  Coreg 12.5 Mg Tabs (Carvedilol) .Marland Kitchen.. 1 two times a day by mouth 4)  Simvastatin 20 Mg Tabs (Simvastatin) .Marland Kitchen.. 1 tablet daily by mouth 5)  Colchicine 0.6 Mg Tabs (Colchicine) .Marland Kitchen.. 1 two times a day to three times a day  as needed for gout attacks 6)  Diovan 160 Mg Tabs (Valsartan) .... One tab by mouth daily 7)  Vitamin D 1000 Unit Caps (Cholecalciferol) .Marland Kitchen.. 1 daily by mouth 8)  Allegra 180 Mg Tabs (Fexofenadine hcl) .Marland Kitchen.. 1 tablet once a day as needed 9)  Multivitamins Tabs (Multiple vitamin) .Marland Kitchen.. 1 tab two times a day 10)  Fish Oil 1000 Mg Caps (Omega-3 fatty acids) .... Take one by mouth daily 11)  Calcium Citrate-vitamin D 200-125 Mg-unit Tabs (Calcium citrate-vitamin d) .... Take one by mouth daily 12)  Delsym  .... Prn 13)  Mucinex  .... Prn 14)  Tussionex Pennkinetic Er 10-8 Mg/29ml Lqcr (Hydrocod polst-chlorphen polst) .... One tsp by mouth mat night for cough as needed.  Patient Instructions: 1)  I would try to get some rest this weekend, drink plenty of fluids, and use the tussionex as needed.  I  expect the cough to gradually resolve.  Let us know if your symptoms get worse.  Take care.  Glad to see you.    Orders Added: 1)  Est. Patient Level III [16109]    Current Allergies (reviewed today): ! AMOXICILLIN ! SULFA

## 2010-10-19 ENCOUNTER — Encounter: Payer: Self-pay | Admitting: Family Medicine

## 2010-10-19 ENCOUNTER — Ambulatory Visit (INDEPENDENT_AMBULATORY_CARE_PROVIDER_SITE_OTHER): Payer: BC Managed Care – PPO | Admitting: Family Medicine

## 2010-10-19 DIAGNOSIS — I428 Other cardiomyopathies: Secondary | ICD-10-CM

## 2010-10-24 NOTE — Assessment & Plan Note (Signed)
Summary: DISCUSS CHANGE OF MEDICINE   Vital Signs:  Patient profile:   65 year old female Weight:      205.25 pounds Temp:     97.6 degrees F oral Pulse rate:   80 / minute Pulse rhythm:   regular BP sitting:   110 / 70  (left arm) Cuff size:   large  Vitals Entered By: Sydell Axon LPN (October 19, 2010 8:42 AM) CC: Discuss medication change   History of Present Illness: Pt here due to inavailability of Diovan. She has tolerated this well and has about three weeks of medication left. Her BP today is great. She was initially put on this medication for cardiomyopathy and has done well on Coreg and Diovan.  She has no complaints and feels well. She is just about over her late winter cold.  Problems Prior to Update: 1)  Bronchitis- Acute  (ICD-466.0) 2)  Hyperglycemia  (ICD-790.29) 3)  Other Screening Mammogram  (ICD-V76.12) 4)  Gout  (ICD-274.9) 5)  Special Screening Malig Neoplasms Other Sites  (ICD-V76.49) 6)  Health Maintenance Exam  (ICD-V70.0) 7)  Allergic Rhinitis, Seasonal  (ICD-477.0) 8)  Cholecystectomy, Hx of  (ICD-V45.79) 9)  Cardiomyopathy  (ICD-425.4) 10)  Hyperlipidemia, Mixed  (ICD-272.2)  Medications Prior to Update: 1)  Bayer Aspirin Ec Low Dose 81 Mg  Tbec (Aspirin) .Marland Kitchen.. 1 Daily 2)  Klor-Con M10 10 Meq  Tbcr (Potassium Chloride Crys Cr) .Marland Kitchen.. 1 Daily By Mouth 3)  Coreg 12.5 Mg  Tabs (Carvedilol) .Marland Kitchen.. 1 Two Times A Day By Mouth 4)  Simvastatin 20 Mg  Tabs (Simvastatin) .Marland Kitchen.. 1 Tablet Daily By Mouth 5)  Colchicine 0.6 Mg Tabs (Colchicine) .Marland Kitchen.. 1 Two Times A Day To Three Times A Day  As Needed For Gout Attacks 6)  Micardis 80 Mg Tabs (Telmisartan) .... One Tab By Mouth Daily in Place of Diovan. 7)  Vitamin D 1000 Unit Caps (Cholecalciferol) .Marland Kitchen.. 1 Daily By Mouth 8)  Allegra 180 Mg Tabs (Fexofenadine Hcl) .Marland Kitchen.. 1 Tablet Once A Day As Needed 9)  Multivitamins   Tabs (Multiple Vitamin) .Marland Kitchen.. 1 Tab Two Times A Day 10)  Fish Oil 1000 Mg Caps (Omega-3 Fatty Acids) ....  Take One By Mouth Daily 11)  Calcium Citrate-Vitamin D 200-125 Mg-Unit Tabs (Calcium Citrate-Vitamin D) .... Take One By Mouth Daily 12)  Delsym .... Prn 13)  Mucinex .... Prn 14)  Tussionex Pennkinetic Er 10-8 Mg/100ml Lqcr (Hydrocod Polst-Chlorphen Polst) .... One Tsp By Mouth Mat Night For Cough As Needed.  Allergies: 1)  ! Amoxicillin 2)  ! Sulfa  Physical Exam  General:  Well-developed,well-nourished,in no acute distress; alert,appropriate and cooperative throughout examination Head:  Normocephalic and atraumatic without obvious abnormalities. No apparent alopecia or balding. Sinuses NT. Eyes:  Conjunctiva clear bilaterally.  Ears:  External ear exam shows no significant lesions or deformities.  Otoscopic examination reveals clear canals, tympanic membranes are intact bilaterally without bulging, retraction, inflammation or discharge. Hearing is grossly normal bilaterally. Nose:  External nasal examination shows no deformity or inflammation. Nasal mucosa are pink and moist without lesions or exudates. Mouth:  Oral mucosa and oropharynx without lesions or exudates.  Teeth in good repair. Neck:  No deformities, masses, or tenderness noted. Lungs:  Ronchi with deep breathing and wet cough throughout. Heart:  Normal rate and regular rhythm. S1 and S2 normal without gallop, murmur, click, rub or other extra sounds.   Impression & Recommendations:  Problem # 1:  CARDIOMYOPATHY (ICD-425.4) Assessment Unchanged Will change Diovan  to Micardis and monitor effect. She is scheduled for Comp Exam in May which will be good time for Bmet and recheck.  Complete Medication List: 1)  Bayer Aspirin Ec Low Dose 81 Mg Tbec (Aspirin) .Marland Kitchen.. 1 daily 2)  Klor-con M10 10 Meq Tbcr (Potassium chloride crys cr) .Marland Kitchen.. 1 daily by mouth 3)  Coreg 12.5 Mg Tabs (Carvedilol) .Marland Kitchen.. 1 two times a day by mouth 4)  Simvastatin 20 Mg Tabs (Simvastatin) .Marland Kitchen.. 1 tablet daily by mouth 5)  Colchicine 0.6 Mg Tabs (Colchicine)  .Marland Kitchen.. 1 two times a day to three times a day  as needed for gout attacks 6)  Micardis 80 Mg Tabs (Telmisartan) .... One tab by mouth daily in place of diovan. 7)  Vitamin D 1000 Unit Caps (Cholecalciferol) .Marland Kitchen.. 1 daily by mouth 8)  Allegra 180 Mg Tabs (Fexofenadine hcl) .Marland Kitchen.. 1 tablet once a day as needed 9)  Multivitamins Tabs (Multiple vitamin) .Marland Kitchen.. 1 tab two times a day 10)  Fish Oil 1000 Mg Caps (Omega-3 fatty acids) .... Take one by mouth daily 11)  Calcium Citrate-vitamin D 200-125 Mg-unit Tabs (Calcium citrate-vitamin d) .... Take one by mouth daily 12)  Delsym  .... Prn 13)  Mucinex  .... Prn 14)  Tussionex Pennkinetic Er 10-8 Mg/53ml Lqcr (Hydrocod polst-chlorphen polst) .... One tsp by mouth mat night for cough as needed.  Patient Instructions: 1)  RTC as scheduled.   Orders Added: 1)  Est. Patient Level III [04540]    Current Allergies (reviewed today): ! AMOXICILLIN ! SULFA

## 2010-10-24 NOTE — Letter (Signed)
Summary: Results Follow up Letter  [place] at [facility]  [address] [place], [place] [address]   Phone: [phone]  Fax: [phone]    10/17/2010 MRN: [id number]    Betty [name] [address] [place], [place]  [address]  [place]    Dear Ms. KANNAN,  The following are the results of your recent test(s):  Test         Result    Pap Smear:        Normal _____  Not Normal _____ Comments: ______________________________________________________ Cholesterol: LDL(Bad cholesterol):         Your goal is less than:         HDL (Good cholesterol):       Your goal is more than: Comments:  ______________________________________________________ Mammogram:        Normal __X___  Not Normal _____ Comments: Please repeat in one year.  ___________________________________________________________________ Hemoccult:        Normal _____  Not normal _______ Comments:    _____________________________________________________________________ Other Tests:    We routinely do not discuss normal results over the telephone.  If you desire a copy of the results, or you have any questions about this information we can discuss them at your next office visit.   Sincerely,    Laurita Quint, MD

## 2010-10-24 NOTE — Progress Notes (Signed)
Summary: diovan is unavailable  Phone Note From Pharmacy   Caller: [facility] Summary of Call: [facility] has faxed a form stating that diovan is temorarily unavailable from manufacturer.  Form is on your desk.                  Lowella Petties CMA, AAMA  October 13, 2010 9:41 AM   Follow-up for Phone Call        Pls have her see me Wed or Thu Follow-up by: Shaune Leeks MD,  October 16, 2010 11:45 AM  Additional Follow-up for Phone Call Additional follow up Details #1::        Advised pt, appt made. Additional Follow-up by: Lowella Petties CMA, AAMA,  October 18, 2010 1:16 PM    New/Updated Medications: MICARDIS 80 MG TABS (TELMISARTAN) one tab by mouth daily in place of Diovan. Prescriptions: MICARDIS 80 MG TABS (TELMISARTAN) one tab by mouth daily in place of Diovan.  #90 x 3   Entered by:   Shaune Leeks MD   Authorized by:   Lowella Petties CMA, AAMA   Signed by:   Shaune Leeks MD on 10/18/2010   Method used:   Faxed to ...       [facility] (mail-order)             , [place]         Ph: [phone]       Fax: [phone]   RxID:   [id number]

## 2010-11-01 ENCOUNTER — Encounter: Payer: Self-pay | Admitting: Family Medicine

## 2010-11-09 LAB — COMPREHENSIVE METABOLIC PANEL
ALT: 30 U/L (ref 0–35)
Alkaline Phosphatase: 71 U/L (ref 39–117)
BUN: 12 mg/dL (ref 6–23)
CO2: 29 mEq/L (ref 19–32)
Calcium: 9.7 mg/dL (ref 8.4–10.5)
GFR calc non Af Amer: >60 mL/min (ref >60)
Glucose, Bld: 140 mg/dL — ABNORMAL HIGH (ref 70–99)
Sodium: 140 mEq/L (ref 135–145)
Total Protein: 7.7 g/dL (ref 6.0–8.3)

## 2010-11-09 LAB — CBC
HCT: 39.7 % (ref 36.0–46.0)
MCH: 33.7 pg (ref 26.0–34.0)
MCHC: 34.7 g/dL (ref 30.0–36.0)
MCV: 97.2 fL (ref 78.0–100.0)
RDW: 12.4 % (ref 11.5–15.5)

## 2010-12-01 ENCOUNTER — Other Ambulatory Visit: Payer: Self-pay | Admitting: Internal Medicine

## 2010-12-03 ENCOUNTER — Other Ambulatory Visit: Payer: Self-pay | Admitting: Family Medicine

## 2010-12-11 ENCOUNTER — Other Ambulatory Visit: Payer: Self-pay | Admitting: Family Medicine

## 2010-12-11 DIAGNOSIS — R7309 Other abnormal glucose: Secondary | ICD-10-CM

## 2010-12-11 DIAGNOSIS — I428 Other cardiomyopathies: Secondary | ICD-10-CM

## 2010-12-11 DIAGNOSIS — E782 Mixed hyperlipidemia: Secondary | ICD-10-CM

## 2010-12-11 DIAGNOSIS — M109 Gout, unspecified: Secondary | ICD-10-CM

## 2010-12-25 ENCOUNTER — Other Ambulatory Visit (INDEPENDENT_AMBULATORY_CARE_PROVIDER_SITE_OTHER): Payer: BC Managed Care – PPO

## 2010-12-25 DIAGNOSIS — M109 Gout, unspecified: Secondary | ICD-10-CM

## 2010-12-25 DIAGNOSIS — R7309 Other abnormal glucose: Secondary | ICD-10-CM

## 2010-12-25 DIAGNOSIS — E782 Mixed hyperlipidemia: Secondary | ICD-10-CM

## 2010-12-25 DIAGNOSIS — I428 Other cardiomyopathies: Secondary | ICD-10-CM

## 2010-12-25 LAB — CBC WITH DIFFERENTIAL/PLATELET
Basophils Absolute: 0.1 10*3/uL (ref 0.0–0.1)
Eosinophils Absolute: 0.2 10*3/uL (ref 0.0–0.7)
HCT: 39.6 % (ref 36.0–46.0)
Lymphs Abs: 2.5 10*3/uL (ref 0.7–4.0)
MCHC: 34.4 g/dL (ref 30.0–36.0)
MCV: 97.9 fl (ref 78.0–100.0)
Monocytes Absolute: 0.7 10*3/uL (ref 0.1–1.0)
Platelets: 240 10*3/uL (ref 150.0–400.0)
RDW: 12.8 % (ref 11.5–14.6)

## 2010-12-25 LAB — BASIC METABOLIC PANEL
BUN: 14 mg/dL (ref 6–23)
Chloride: 99 mEq/L (ref 96–112)
Creatinine, Ser: 0.7 mg/dL (ref 0.4–1.2)
Glucose, Bld: 106 mg/dL — ABNORMAL HIGH (ref 70–99)
Potassium: 4.2 mEq/L (ref 3.5–5.1)

## 2010-12-25 LAB — LIPID PANEL
Cholesterol: 171 mg/dL (ref 0–200)
HDL: 57.7 mg/dL (ref >39.00)
Total CHOL/HDL Ratio: 3
VLDL: 58 mg/dL — ABNORMAL HIGH (ref 0.0–40.0)

## 2010-12-25 LAB — LDL CHOLESTEROL, DIRECT: Direct LDL: 71.7 mg/dL

## 2010-12-25 LAB — HEPATIC FUNCTION PANEL
Albumin: 3.6 g/dL (ref 3.5–5.2)
Alkaline Phosphatase: 66 U/L (ref 39–117)
Total Bilirubin: 0.5 mg/dL (ref 0.3–1.2)

## 2010-12-25 LAB — TSH: TSH: 5.24 u[IU]/mL (ref 0.35–5.50)

## 2010-12-27 ENCOUNTER — Other Ambulatory Visit: Payer: Self-pay | Admitting: Family Medicine

## 2010-12-28 ENCOUNTER — Encounter: Payer: Self-pay | Admitting: Family Medicine

## 2010-12-28 ENCOUNTER — Ambulatory Visit (INDEPENDENT_AMBULATORY_CARE_PROVIDER_SITE_OTHER): Payer: BC Managed Care – PPO | Admitting: Family Medicine

## 2010-12-28 VITALS — BP 108/64 | HR 84 | Temp 97.3°F | Ht 65.0 in | Wt 203.2 lb

## 2010-12-28 DIAGNOSIS — R7309 Other abnormal glucose: Secondary | ICD-10-CM

## 2010-12-28 DIAGNOSIS — Z0000 Encounter for general adult medical examination without abnormal findings: Secondary | ICD-10-CM

## 2010-12-28 DIAGNOSIS — M109 Gout, unspecified: Secondary | ICD-10-CM

## 2010-12-28 DIAGNOSIS — E782 Mixed hyperlipidemia: Secondary | ICD-10-CM

## 2010-12-28 DIAGNOSIS — I428 Other cardiomyopathies: Secondary | ICD-10-CM

## 2010-12-28 NOTE — Assessment & Plan Note (Signed)
One to two episodes a year still does not warrant trmt. Discussed new evidence.

## 2010-12-28 NOTE — Patient Instructions (Addendum)
Is about to start Micardis as she has had Diovan all this time.  RTC 3 mos for recheck with Bmet prior. Given ltr for iFOB.

## 2010-12-28 NOTE — Assessment & Plan Note (Signed)
Mildly elevated but stable. Continue avoiding sweets and carbs as much as possible. Weight loss will help.

## 2010-12-28 NOTE — Progress Notes (Signed)
  Subjective:    Patient ID: Betty [name], female    DOB: Feb 21, 1946, 65 y.o.   MRN: [id number]  HPI Pt here for Comp Exam. She has D&C last year for PMB and was nml. She has had no bleeding since. She feels well and has no complaints. Both she and her husband could stand to lose a few pounds.  She had a left ankle gouty attack working on her feet during the Christmas Season.    Review of Systems  Constitutional: Negative for fever, chills, diaphoresis, fatigue and unexpected weight change.  HENT: Negative for hearing loss, ear pain, rhinorrhea, trouble swallowing and tinnitus.   Eyes: Negative for pain, discharge and visual disturbance.  Respiratory: Negative for cough, shortness of breath and wheezing.   Cardiovascular: Negative for chest pain, palpitations and leg swelling.       No Fainting or Fatigue. Mild SOB w/ exertion.  Gastrointestinal: Negative for nausea, vomiting, abdominal pain, diarrhea, constipation and blood in stool.       No Heartburn  Genitourinary: Negative for dysuria and frequency.  Musculoskeletal: Negative for myalgias and back pain. Arthralgias: mild, global.  Skin: Negative for rash.       No Itching or Dryness.  Neurological: Negative for tremors and numbness.       No Tingling. No Balance Problems.  Hematological: Negative for adenopathy. Does not bruise/bleed easily.  Psychiatric/Behavioral: Negative for dysphoric mood and agitation.       Objective:   Physical Exam  Constitutional: She is oriented to person, place, and time. She appears well-developed and well-nourished. No distress.  HENT:  Head: Normocephalic and atraumatic.  Right Ear: External ear normal.  Left Ear: External ear normal.  Nose: Nose normal.  Mouth/Throat: Oropharynx is clear and moist. No oropharyngeal exudate.  Eyes: Conjunctivae and EOM are normal. Pupils are equal, round, and reactive to light. No scleral icterus.  Neck: Normal range of motion. Neck supple. No  thyromegaly present.  Cardiovascular: Normal rate, regular rhythm, normal heart sounds and intact distal pulses.  Exam reveals no friction rub.   No murmur heard. Pulmonary/Chest: Effort normal and breath sounds normal. No respiratory distress. She has no wheezes. She has no rales.  Abdominal: Soft. Bowel sounds are normal. She exhibits no mass. There is no tenderness.  Genitourinary: Vagina normal and uterus normal. Guaiac negative stool.       Introitus Nml Cervix Nml without tenderness Ovaries Not Felt   Musculoskeletal: Normal range of motion. She exhibits no edema and no tenderness.       Back Nontender in the Midline  Lymphadenopathy:    She has no cervical adenopathy.  Neurological: She is alert and oriented to person, place, and time. She has normal reflexes. She exhibits normal muscle tone. Coordination normal.  Skin: Skin is warm and dry. No rash noted. She is not diaphoretic. No erythema.  Psychiatric: She has a normal mood and affect. Her behavior is normal. Judgment and thought content normal.          Assessment & Plan:  Comp Exam

## 2010-12-28 NOTE — Progress Notes (Signed)
Addended byMills Koller on: 12/28/2010 11:40 AM   Modules accepted: Orders

## 2010-12-28 NOTE — Assessment & Plan Note (Addendum)
Seems stable. Due to see Dr Tenny Craw in another 6 mos or so. Cont curr meds. Has tolerated well both clinically and metabolically per labs. Is about to start Micardis (per insurance) as she is just about to run out of Diovan.

## 2010-12-28 NOTE — Assessment & Plan Note (Signed)
Acceptable. Cont curr meds. Lab Results  Component Value Date   CHOL 171 12/25/2010   CHOL 165 12/14/2009   CHOL 182 12/13/2008   Lab Results  Component Value Date   HDL 57.70 12/25/2010   HDL 16.10 12/14/2009   HDL 96.04 12/13/2008   Lab Results  Component Value Date   LDLCALC 62 12/03/2007   LDLCALC 68 05/02/2007   LDLCALC 54 11/27/2006   Lab Results  Component Value Date   TRIG 290.0* 12/25/2010   TRIG 247.0* 12/14/2009   TRIG 235.0* 12/13/2008   Lab Results  Component Value Date   CHOLHDL 3 12/25/2010   CHOLHDL 2 12/14/2009   CHOLHDL 3 12/13/2008

## 2011-01-09 NOTE — Assessment & Plan Note (Signed)
NAME:  Betty [name], Betty [name]              ACCOUNT NO.:  [id number]   MEDICAL RECORD NO.:  [id number]          PATIENT TYPE:  POB   LOCATION:  [facility]         FACILITY:  [facility]   PHYSICIAN:  Catalina Antigua, MD     DATE OF BIRTH:  [birth year]   DATE OF SERVICE:  03/22/2010                                  CLINIC NOTE   This is a 65 year old G3, P2-0-1-2 who is postmenopausal for  approximately 6 years, who presents today for evaluation of vaginal  bleeding.  The patient states that she noticed vaginal spotting  approximately 2 months ago, initially was brownish discharge which  turned to bright red.  The patient states that she spots approximately  for 2-3 days, then has a period of no bleeding for approximately 1-2  weeks, and then the spotting returns, in this pattern has been going on  for the past 2 months.  The patient denies any cramping.  The patient is  not currently sexually active.  The patient is otherwise without any  complaints.   PAST MEDICAL HISTORY:  Significant for arthritis, hypercholesterolemia,  heart disease, and cardiomyopathy.   PAST SURGICAL HISTORY:  Significant for tubal ligation in 1975 followed  by cholecystectomy in 2002.   PAST OBSTETRICAL HISTORY:  She has had 2 vaginal deliveries and 1  miscarriage.   PAST GYN HISTORY:  She denies history of any abnormal Pap smear, ovarian  cyst, or fibroids.  She denies also any STDs.  She has had a mammogram  in January 2011.  Her last Pap smear was in May 2011 and she is  scheduled for colonoscopy later this year.   SOCIAL HISTORY:  She denies drinking, smoking, and the use of illicit  drugs.   REVIEW OF SYSTEMS:  Otherwise, significant for joint pain.   PHYSICAL EXAMINATION:  VITAL SIGNS:  Her blood pressure is 108/65, pulse  of 75, weight of 203 pounds.  LUNGS:  Clear to auscultation bilaterally.  HEART:  Regular rate and rhythm.  ABDOMEN:  Soft, nontender, nondistended.  PELVIC:  Normal external  genitalia.  She had normal-appearing vaginal  mucosa and cervix, mildly atrophic.  Bimanual exam shows small  anteverted uterus.  No palpable adnexal masses or tenderness.   ASSESSMENT AND PLAN:  This is a 65 year old G3, P2 postmenopausal for 6  years, who presents today with episodes of postmenopausal vaginal  bleeding that occurred over the past 2 months.  The patient also gave a  history of being on HRT many, many years ago, but has stopped taking it  over the past 4-5 years approximately.  After informed consent was  obtained and following the standard sterile techniques, the endometrial  biopsy was performed.  The uterine cavity was sounded to 7 cm.  The patient tolerated procedure  well.  An ultrasound of the pelvis was also ordered.  The patient is  scheduled to return in 2 weeks for discussion of the results.           ______________________________  Catalina Antigua, MD     PC/MEDQ  D:  03/22/2010  T:  03/23/2010  Job:  [id number]

## 2011-01-09 NOTE — Assessment & Plan Note (Signed)
[facility]                            CARDIOLOGY OFFICE NOTE   ASSUNTA, PUPO                       MRN:          [id number]  DATE:12/29/2007                            DOB:          [birth year]    IDENTIFICATION:  Betty [name] is a 65 year old woman with a nonischemic  cardiomyopathy (normal coronary arteries by cath in 2003).  Last  echocardiogram in April of 2008 showed an LVEF of 50-55%.   She was last seen in April of 2008.   Since seen, she continues to feel well.  She denies shortness of breath.  No chest pain.   CURRENT MEDICATIONS:  1. Multivitamin.  2. Simvastatin 20.  3. Diovan/HCTZ 160/25.  4. Coreg 12.5 b.i.d.  5. Aspirin 81.  6. Klor-Con 10.  7. Calcium with D.  8. Fish oil.   PHYSICAL EXAMINATION:  GENERAL:  The patient is in no distress.  VITAL SIGNS:  Blood pressure is 113/74, pulse is 92, weight 201, stable.  LUNGS:  Clear.  CARDIAC:  Regular rate and rhythm, S1-S2, no S3, no murmurs.  NECK:  JVP is normal.  ABDOMEN:  Benign.  EXTREMITIES:  No edema.   12-lead EKG:  Normal sinus rhythm, 84 beats per minute, low voltage.   IMPRESSION:  1. Cardiomyopathy, minimal left ventricular dysfunction.  Doing well      on current regimen.  Would maintain.  2. Dyslipidemia.  Will check fasting labs.   Otherwise, I will set followup for 1 year, sooner if problems develop.    Pricilla Riffle, MD, [facility]  Electronically Signed   PVR/MedQ  DD: 12/29/2007  DT: 12/29/2007  Job #: [id number]   cc:   Arta Silence, MD

## 2011-01-09 NOTE — Assessment & Plan Note (Signed)
[name], Betty              ACCOUNT NO.:  [id number]   MEDICAL RECORD NO.:  [id number]          PATIENT TYPE:  POB   LOCATION:  [facility]         FACILITY:  [facility]   PHYSICIAN:  Allie Bossier, MD        DATE OF BIRTH:  07/04/1946   DATE OF SERVICE:  04/06/2010                                  CLINIC NOTE   Betty [name] is a 65 year old married white G3, P2, A1 who has been  menopausal for about 6 years.  She saw Dr. Catalina Antigua in  July of  this year with a complaint of a 32-month history of postmenopausal  bleeding.  Dr. Jolayne Panther did an endometrial biopsy which showed benign  superficial inactive endometrium.  She ordered an ultrasound which  showed a thickened (9-mm) endometrium that was suspicious for  hyperplasia.  There was a small focal fibroid measuring 1.4 cm.  The  ovaries were not seen.  There were no other abnormalities noted.   PAST MEDICAL HISTORY:  Postmenopausal bleeding, cardiomyopathy, (Dr.  Dietrich Pates at [facility]), hypertension, high cholesterol, obesity,  seasonal allergies, and gout.   PAST SURGICAL HISTORY:  Tubal ligation and cholecystectomy.   REVIEW OF SYSTEMS:  Mammogram is up-to-date normal as of 2011.  Pap  smear was in May of this year and is normal and she is scheduled for a  colonoscopy probably due to a positive Hemoccult test done in May of  this year.   Medicine allergies are SULFA and AMOXICILLIN.  No latex allergies or  food allergies, and mild SHELLFISH ALLERGY.   MEDICATIONS:  She takes:  1. 81 mg of aspirin daily.  2. Potassium 10 mEq daily.  3. Coreg 12.5 mg.  4. Simvastatin 20 mg daily.  5. Colchicine 0.6 mg b.i.d.  6. Diovan 160 mg daily.  7. Vitamin D.  8. Allegra on a p.r.n. basis.  9. Multivitamin b.i.d.  10.Fish oil 1000 mg daily.  11.Calcium citrate with vitamin D daily.  12.Mucinex as necessary.   PHYSICAL EXAMINATION:  GENERAL:  A well-nourished, well-hydrated  pleasant white female.  VITAL SIGNS:  Height 5  feet 5 inches, weight 201, blood pressure 114/70,  pulse 79.  HEENT:  Normal.  HEART:  Regular rhythm.  LUNGS:  Clear to auscultation bilaterally.  ABDOMEN:  Obese, benign.  EXTERNAL GENITALIA:  Normal.  PELVIC:  Some mild atrophy and a bimanual exam revealed a small  anteverted uterus.  There is no adnexal masses.   ASSESSMENT AND PLAN:  Postmenopausal bleeding with thickened  endometrium.  Recommended a D and C and hysteroscopy.  Risks of surgery  expansion accepted.  She wishes to proceed.  She will certainly need a  preop visit with the anesthesiologist prior surgery.      Allie Bossier, MD     MCD/MEDQ  D:  04/06/2010  T:  04/06/2010  Job:  [id number]

## 2011-01-12 ENCOUNTER — Other Ambulatory Visit: Payer: BC Managed Care – PPO

## 2011-01-12 ENCOUNTER — Other Ambulatory Visit (INDEPENDENT_AMBULATORY_CARE_PROVIDER_SITE_OTHER): Payer: BC Managed Care – PPO | Admitting: Family Medicine

## 2011-01-12 DIAGNOSIS — Z1211 Encounter for screening for malignant neoplasm of colon: Secondary | ICD-10-CM

## 2011-01-12 NOTE — Assessment & Plan Note (Signed)
[facility]                            CARDIOLOGY OFFICE NOTE   Betty, [name]                     MRN:          [id number]  DATE:12/09/2006                            DOB:          [birth year]    IDENTIFICATION:  Betty [name] is a 65 year old woman, nonischemic  cardiomyopathy (normal coronary arteries on catheterization 2003) last  seen in April 2007.   In the interval, she has done well.  No shortness of breath, no chest  pain.   CURRENT MEDICATIONS:  1. __________  2. Multivitamin.  3. Vytorin half of a 10/10.  4. Diovan/hydrochlorothiazide 160/25.  5. Coreg 12.5 b.i.d.  6. Aspirin 81 mg daily.  7. Klor-Con 10 mEq daily.  8. Os-Cal 500 b.i.d.   PHYSICAL EXAMINATION:  GENERAL:  The patient is in no distress.  VITAL SIGNS:  Blood pressure 121/80, pulse 82, weight not taken.  NECK:  JVP is normal, no bruits.  LUNGS:  Clear.  CARDIAC:  Regular rate and rhythm.  S1, S2, no S3, no murmurs.  ABDOMEN:  Benign.  EXTREMITIES:  No edema.   Laboratories on April 2 include a cholesterol of 147, HDL of 55, LDL of  54.   On 12-lead EKG:  Sinus rhythm, 74 beats per minute, nonspecific T-wave  changes.   IMPRESSION:  1. Nonischemic cardiomyopathy.  Note an echocardiogram done back in      2006 showed an EF of 45-55%.  I would like to repeat to confirm      there is no change clinically, doing very well.  Continue      medications.  2. Dyslipidemia.  Excellent control.  I would keep her on this tightly      controlled, given that she has little reserve for a change.  Again,      she is on a low dose now.   I will follow up in 1 year's time, be in touch with her regarding the  results.     Pricilla Riffle, MD, [facility]  Electronically Signed   PVR/MedQ  DD: 12/09/2006  DT: 12/10/2006  Job #: [id number]

## 2011-01-12 NOTE — Cardiovascular Report (Signed)
[place]  Patient:    Betty [name], Betty [name] Visit Number: [id number] MRN: [id number]          Service Type: CAT Location: [facility] 2899 15 Attending Physician:  Colon Branch Dictated by:   Noralyn Pick Eden Emms, M.D., [facility] Proc. Date: 11/04/01 Admit Date:  11/04/2001   CC:         Laurita Quint, M.D. [facility]  Casimiro Needle B. Sherene Sires, M.D. [facility]  Dietrich Pates, M.D. [facility]   Cardiac Catheterization  INDICATIONS: Dyspnea, EF 25% by echocardiography.  Rule out cardiomyopathy, ischemic.  PROCEDURE:  Coronary Angiography.  CARDIOLOGIST:  Noralyn Pick. Eden Emms, M.D., [facility]  DESCRIPTION OF PROCEDURE:  Catheterization is done from the right femoral artery and vein.  A JL4 catheter was used to inject the LAD, and a JL5 catheter was used to inject the circumflex.  Standard JR4 catheter was used for the right.  RESULTS:  The left main coronary artery was normal.  Left anterior descending artery was normal.  The circumflex coronary artery was normal.  Right coronary artery was dominant and was normal.  RAO VENTRICULOGRAPHY:  RAO ventriculography revealed global hypokinesis with an ejection fraction in the 40% range.  There is mild MR.  There was no gradient across the aortic valve.  Aortic pressure was 144/84.  LV pressure was 144/18.  Right heart catheterization was done to assess filling pressures to further guide therapy.  Mean right atrial pressure was 8, RV pressure 33/10, PA pressure 26/16, mean pulmonary capillary wedge pressure 16.  Cardiac output by thermodilution was 7.4 with a cardiac index of 3.8.  IMPRESSION:  Betty [name] would appear to have a nonischemic dilated cardiomyopathy.  Her ejection fraction does not seem as bad by catheterization as indicated by echocardiogram.  I think in six months on medical therapy, it may be worthwhile to do a followup MUGA scan to more accurately assess her ejection fraction.  Her filling pressures are in  a good range, and her cardiac output is actually quite good at this point.  She will follow up with Dr. Tenny Craw as an outpatient in the next three to four weeks to further adjust her medicine. Dictated by:   Noralyn Pick Eden Emms, M.D., [facility] Attending Physician:  Colon Branch DD:  11/04/01 TD:  11/04/01 Job: [id number]

## 2011-01-16 ENCOUNTER — Encounter: Payer: Self-pay | Admitting: *Deleted

## 2011-02-21 ENCOUNTER — Encounter: Payer: Self-pay | Admitting: Family Medicine

## 2011-02-21 ENCOUNTER — Ambulatory Visit (INDEPENDENT_AMBULATORY_CARE_PROVIDER_SITE_OTHER): Payer: BC Managed Care – PPO | Admitting: Family Medicine

## 2011-02-21 DIAGNOSIS — M109 Gout, unspecified: Secondary | ICD-10-CM

## 2011-02-21 MED ORDER — HYDROCOD POLST-CHLORPHEN POLST 10-8 MG/5ML PO LQCR: 5.0000 mL | Freq: Two times a day (BID) | ORAL | Status: DC | PRN

## 2011-02-21 MED ORDER — DM-GUAIFENESIN ER 30-600 MG PO TB12: 1.0000 | ORAL_TABLET | ORAL | Status: DC | PRN

## 2011-02-21 MED ORDER — COLCHICINE 0.6 MG PO TABS: 0.6000 mg | ORAL_TABLET | Freq: Every day | ORAL | Status: DC

## 2011-02-21 MED ORDER — DEXTROMETHORPHAN POLISTIREX 30 MG/5ML PO LQCR: 60.0000 mg | ORAL | Status: DC | PRN

## 2011-02-21 NOTE — Progress Notes (Signed)
R posterior ankle pain, swelling and redness.  H/o gout.  Hydration and cherry juice help some.  Less sore today but still tender.  This episode started ~1 week ago.  Normally with MTP flares, but she's had it in the ankle prev.  Taking colchicine the past, but not with this episode.  No FCANVD.  No trauma known, other than bumping the R ankle a few weeks ago on a bed frame.    Meds, vitals, and allergies reviewed.   ROS: See HPI.  Otherwise, noncontributory.  nad r foot with normal inspection except for tender area of redness in posterior midline near the achilles insertion.  Pain with flex/ext but achilles not ttp.  Normal inspection and rom o/w.  1st MTP not ttp or red.

## 2011-02-21 NOTE — Patient Instructions (Signed)
I would take aleve- 1-2 tabs twice a day with food for the pain.  Drink plenty of fluids and use the colchicine if needed.  This should gradually improve.  If you continue to have frequent flares, let us know.  Take care.

## 2011-02-21 NOTE — Assessment & Plan Note (Signed)
Improving, I don't suspect abscess.  Use aleve with GI/renal caution and then add on colchicine if needed.  I don't this is related to change in ARB and this was d/w pt.

## 2011-03-06 ENCOUNTER — Other Ambulatory Visit: Payer: Self-pay | Admitting: Family Medicine

## 2011-04-09 ENCOUNTER — Other Ambulatory Visit (INDEPENDENT_AMBULATORY_CARE_PROVIDER_SITE_OTHER): Payer: BC Managed Care – PPO | Admitting: Family Medicine

## 2011-04-09 DIAGNOSIS — I428 Other cardiomyopathies: Secondary | ICD-10-CM

## 2011-04-09 LAB — BASIC METABOLIC PANEL
BUN: 16 mg/dL (ref 6–23)
Calcium: 8.9 mg/dL (ref 8.4–10.5)
Chloride: 99 mEq/L (ref 96–112)
Creatinine, Ser: 0.6 mg/dL (ref 0.4–1.2)
GFR: 101.06 mL/min (ref >60.00)

## 2011-04-12 ENCOUNTER — Encounter: Payer: Self-pay | Admitting: Family Medicine

## 2011-04-12 ENCOUNTER — Ambulatory Visit (INDEPENDENT_AMBULATORY_CARE_PROVIDER_SITE_OTHER): Payer: BC Managed Care – PPO | Admitting: Family Medicine

## 2011-04-12 VITALS — BP 112/72 | HR 76 | Temp 97.2°F | Ht 65.0 in | Wt 204.8 lb

## 2011-04-12 DIAGNOSIS — M109 Gout, unspecified: Secondary | ICD-10-CM

## 2011-04-12 DIAGNOSIS — I428 Other cardiomyopathies: Secondary | ICD-10-CM

## 2011-04-12 DIAGNOSIS — M79606 Pain in leg, unspecified: Secondary | ICD-10-CM | POA: Insufficient documentation

## 2011-04-12 DIAGNOSIS — M79609 Pain in unspecified limb: Secondary | ICD-10-CM

## 2011-04-12 DIAGNOSIS — E782 Mixed hyperlipidemia: Secondary | ICD-10-CM

## 2011-04-12 MED ORDER — LOSARTAN POTASSIUM 50 MG PO TABS: 50.0000 mg | ORAL_TABLET | Freq: Every day | ORAL | Status: DC

## 2011-04-12 NOTE — Assessment & Plan Note (Signed)
Due to leg discomfort, will change Micardis to Cozaar.

## 2011-04-12 NOTE — Assessment & Plan Note (Signed)
Discussed slight risk of Colchicine increasing risk of Myopathy. She takes Colchicine only when symptomatic and very little length of time.

## 2011-04-12 NOTE — Patient Instructions (Signed)
RTC three months to assess medication tolerance and BP

## 2011-04-12 NOTE — Assessment & Plan Note (Signed)
Discussed interaction of Colchicine and Simva. She will take Colchicine sparingly when it is needed.

## 2011-04-12 NOTE — Progress Notes (Signed)
  Subjective:    Patient ID: Betty [name], female    DOB: [birth year], 64 y.o.   MRN: [id number]  HPI Pt here for followup after changing Diovan to Micardis due to insurance request. She is having severe lateral muscle aches from the time she has started the Micardis and now admits she also had minimal versions of this on the Diovan. She was started on Diovan from the beginning for the cardiomyopathy.     Review of Systems  Constitutional: Negative for fever, chills, diaphoresis, activity change and fatigue.  HENT: Negative for ear pain, congestion, rhinorrhea and postnasal drip.   Eyes: Negative for redness.  Respiratory: Negative for cough, chest tightness, shortness of breath and wheezing.   Cardiovascular: Negative for chest pain.       Objective:   Physical Exam  Constitutional: She appears well-developed and well-nourished. No distress.  HENT:  Head: Normocephalic and atraumatic.  Right Ear: External ear normal.  Left Ear: External ear normal.  Nose: Nose normal.  Mouth/Throat: Oropharynx is clear and moist. No oropharyngeal exudate.  Eyes: Conjunctivae and EOM are normal. Pupils are equal, round, and reactive to light.  Neck: Normal range of motion. Neck supple. No thyromegaly present.  Cardiovascular: Normal rate, regular rhythm and normal heart sounds.   Pulmonary/Chest: Effort normal and breath sounds normal. She has no wheezes. She has no rales.  Musculoskeletal: Normal range of motion. She exhibits tenderness (mild lateral discomfort of the upper legs. ). She exhibits no edema.  Lymphadenopathy:    She has no cervical adenopathy.  Skin: She is not diaphoretic.          Assessment & Plan:

## 2011-04-12 NOTE — Assessment & Plan Note (Signed)
Seems due to or enhanced by Micardis. Will switch to Cozaar and reassess. BP great but is on this for cardiomyopathy. Will consider switching to ACEI.

## 2011-05-03 ENCOUNTER — Ambulatory Visit (INDEPENDENT_AMBULATORY_CARE_PROVIDER_SITE_OTHER): Payer: BC Managed Care – PPO | Admitting: Physician Assistant

## 2011-05-03 ENCOUNTER — Encounter: Payer: Self-pay | Admitting: Physician Assistant

## 2011-05-03 VITALS — BP 116/64 | HR 85 | Ht 65.0 in | Wt 205.8 lb

## 2011-05-03 DIAGNOSIS — IMO0001 Reserved for inherently not codable concepts without codable children: Secondary | ICD-10-CM

## 2011-05-03 DIAGNOSIS — M791 Myalgia, unspecified site: Secondary | ICD-10-CM | POA: Insufficient documentation

## 2011-05-03 DIAGNOSIS — I428 Other cardiomyopathies: Secondary | ICD-10-CM

## 2011-05-03 DIAGNOSIS — E782 Mixed hyperlipidemia: Secondary | ICD-10-CM

## 2011-05-03 DIAGNOSIS — R7309 Other abnormal glucose: Secondary | ICD-10-CM

## 2011-05-03 DIAGNOSIS — R0609 Other forms of dyspnea: Secondary | ICD-10-CM

## 2011-05-03 DIAGNOSIS — M109 Gout, unspecified: Secondary | ICD-10-CM

## 2011-05-03 DIAGNOSIS — R0600 Dyspnea, unspecified: Secondary | ICD-10-CM

## 2011-05-03 LAB — CK TOTAL AND CKMB (NOT AT ARMC): CK, MB: 1.6 ng/mL (ref 0.3–4.0)

## 2011-05-03 NOTE — Assessment & Plan Note (Signed)
I suspect this is secondary to her statin therapy.  I do not think that her ARB is her causing her muscle pain.  She has good pulses in her legs and she denies any symptoms of claudication.  I will have her stop her simvastatin.  She will contact us in a couple of weeks to apprise [place] of her symptoms.  I will have her obtain a total CPK, TSH, B12 and folate.

## 2011-05-03 NOTE — Assessment & Plan Note (Addendum)
She has noted increased shortness of breath.  It has been 4 years since her last echocardiogram.  I will obtain a repeat echocardiogram to reassess her LV function.  I will also check a BNP.  Followup in 4 weeks with Dr. Tenny Craw or me on a day she is here.

## 2011-05-03 NOTE — Assessment & Plan Note (Signed)
As noted, hold simvastatin for now to see if this improves her symptoms.

## 2011-05-03 NOTE — Assessment & Plan Note (Signed)
She only takes colchicine p.r.n. Gout flares.

## 2011-05-03 NOTE — Progress Notes (Signed)
History of Present Illness: Primary Cardiologist:  Dr. Dietrich Pates  Betty [name] is a 65 y.o. female presents for leg pain.  She has a history of nonischemic cardiomyopathy, hyperlipidemia, gout and glucose intolerance.  She had a heart catheterization 3/03 demonstrated normal coronary arteries.  Her previous EF was 25%.  This has improved to normal with therapy.  Echocardiogram 4/08: EF 50-55%, mild MR, mild LAE, mild TR.  She last saw Dr. Tenny Craw 7/11.  She was to followup in 18 months.  Over the last several months she has developed significant bilateral leg pain.  She points to her lateral thigh.  It is worse with standing.  She denies any symptoms consistent with claudication.  Her primary care provider has switched her Diovan to Micardis without improvement and Micardis to Cozaar without improvement.  The pain is sharp and intense.  She does note increased dyspnea with exertion.  She describes class II-IIb symptoms.  She denies orthopnea or PND.  Her lower extremity edema is minimal.  She denies chest pain, syncope or palpitations.  Past Medical History  Diagnosis Date  . Hyperlipidemia   . Gout   . NICM (nonischemic cardiomyopathy)     EF 25%; improved to normal - echo 4/08: EF 50-55%, mild MR, mild LAE, mild TR;    cath 3/03: normal cors, EF 40%  . Glucose intolerance (impaired glucose tolerance)     Current Outpatient Prescriptions  Medication Sig Dispense Refill  . aspirin 81 MG EC tablet Take 81 mg by mouth daily.        . Calcium Citrate-Vitamin D 200-125 MG-UNIT TABS Take by mouth daily.        . carvedilol (COREG) 12.5 MG tablet TAKE 1 TABLET TWICE A DAY  180 tablet  3  . chlorpheniramine-HYDROcodone (TUSSIONEX PENNKINETIC ER) 10-8 MG/5ML LQCR Take 5 mLs by mouth every 12 (twelve) hours as needed. 1 tsp by mouth mat night for cough as needed  140 mL    . Cholecalciferol (VITAMIN D3) 1000 UNITS tablet Take 1,000 Units by mouth daily.        . colchicine 0.6 MG tablet Take 0.6 mg  by mouth. 1 two times a day to three times a day as needed for gout       . dextromethorphan (DELSYM) 30 MG/5ML liquid Take 10 mLs (60 mg total) by mouth as needed.  89 mL    . dextromethorphan-guaiFENesin (MUCINEX DM) 30-600 MG per 12 hr tablet Take 1 tablet by mouth as needed.      Marland Kitchen losartan (COZAAR) 50 MG tablet Take 1 tablet (50 mg total) by mouth daily.  30 tablet  11  . multivitamin (THERAGRAN) per tablet Take 1 tablet by mouth daily. 1 tab two times a day      . Omega-3 Fatty Acids (FISH OIL) 1000 MG CPDR Take by mouth daily.        . potassium chloride (K-DUR,KLOR-CON) 10 MEQ tablet TAKE 1 TABLET DAILY (KEEP SCHEDULED APPT)  90 tablet  3  . simvastatin (ZOCOR) 20 MG tablet TAKE 1 TABLET DAILY  90 tablet  3    Allergies: Allergies  Allergen Reactions  . Amoxicillin     REACTION: RASH  . Sulfonamide Derivatives     REACTION: RASH    Social history:  Nonsmoker  ROS:  Please see the history of present illness.  All other systems reviewed and negative.   Vital Signs: BP 116/64  Pulse 85  Ht 5\' 5"  (1.651 m)  Wt 205 lb 12.8 oz (93.35 kg)  BMI 34.25 kg/m2  PHYSICAL EXAM: Well nourished, well developed, in no acute distress HEENT: normal Neck: no JVD Cardiac:  normal S1, S2; RRR; no murmur Lungs:  clear to auscultation bilaterally, no wheezing, rhonchi or rales Abd: soft, nontender, no hepatomegaly Ext: Trace bilateral edema Skin: warm and dry Neuro:  CNs 2-12 intact, no focal abnormalities noted Musculoskeletal: Tenderness noted with palpation of bilateral lateral thighs Psych: Normal affect  EKG:  Sinus rhythm, heart rate 85, and left axis deviation, no ischemic changes  ASSESSMENT AND PLAN:

## 2011-05-03 NOTE — Assessment & Plan Note (Signed)
She does not seem to be describing symptoms of neuropathy.

## 2011-05-03 NOTE — Patient Instructions (Addendum)
Your physician recommends that you schedule a follow-up appointment in: 3-4 weeks  Your physician recommends that you return for lab work in: today  Your physician has requested that you have an echocardiogram. Echocardiography is a painless test that uses sound waves to create images of your heart. It provides your doctor with information about the size and shape of your heart and how well your heart's chambers and valves are working. This procedure takes approximately one hour. There are no restrictions for this procedure.  Your physician has recommended you make the following change in your medication: HOLD Simvastatin --- call us in 2 weeks and let us know if muscle and joint pain is better

## 2011-05-04 LAB — TSH: TSH: 2.15 u[IU]/mL (ref 0.35–5.50)

## 2011-05-08 ENCOUNTER — Encounter: Payer: Self-pay | Admitting: *Deleted

## 2011-05-10 ENCOUNTER — Ambulatory Visit (HOSPITAL_COMMUNITY): Payer: BC Managed Care – PPO | Attending: Internal Medicine | Admitting: Radiology

## 2011-05-10 DIAGNOSIS — I059 Rheumatic mitral valve disease, unspecified: Secondary | ICD-10-CM | POA: Insufficient documentation

## 2011-05-10 DIAGNOSIS — E785 Hyperlipidemia, unspecified: Secondary | ICD-10-CM | POA: Insufficient documentation

## 2011-05-10 DIAGNOSIS — R0600 Dyspnea, unspecified: Secondary | ICD-10-CM

## 2011-05-10 DIAGNOSIS — R0602 Shortness of breath: Secondary | ICD-10-CM | POA: Insufficient documentation

## 2011-05-21 ENCOUNTER — Telehealth: Payer: Self-pay | Admitting: Internal Medicine

## 2011-05-21 NOTE — Telephone Encounter (Signed)
Spoke with pt. She reports some improvement in pain in thigh but still having very localized pain on outer thigh area. Reviewed with Tereso Newcomer, PA who recommends she resume Simvastatin as pain does not seem to be related to statin  and to schedule appt with primary MD to have thigh pain evaluated. Pt agreeable with this plan.

## 2011-05-21 NOTE — Telephone Encounter (Signed)
Pt called. Pt says pain in leg seems better but is not gone. Please call back

## 2011-05-24 ENCOUNTER — Encounter: Payer: Self-pay | Admitting: Family Medicine

## 2011-05-24 ENCOUNTER — Ambulatory Visit (INDEPENDENT_AMBULATORY_CARE_PROVIDER_SITE_OTHER): Payer: BC Managed Care – PPO | Admitting: Family Medicine

## 2011-05-24 VITALS — BP 118/70 | HR 76 | Temp 97.5°F | Wt 205.8 lb

## 2011-05-24 DIAGNOSIS — IMO0001 Reserved for inherently not codable concepts without codable children: Secondary | ICD-10-CM

## 2011-05-24 DIAGNOSIS — M79609 Pain in unspecified limb: Secondary | ICD-10-CM

## 2011-05-24 DIAGNOSIS — M79606 Pain in leg, unspecified: Secondary | ICD-10-CM

## 2011-05-24 DIAGNOSIS — M791 Myalgia, unspecified site: Secondary | ICD-10-CM

## 2011-05-24 MED ORDER — PREDNISONE (PAK) 10 MG PO TABS: 10.0000 mg | ORAL_TABLET | Freq: Every day | ORAL | Status: AC

## 2011-05-24 NOTE — Progress Notes (Signed)
  Subjective:    Patient ID: Betty [name], female    DOB: Jan 17, 1946, 65 y.o.   MRN: [id number]  HPI Pt here for follow up of leg pain. Has been seen at Cardiology and recently stopped Simva due to poss reaction from the medication. Chart review shows the pt improved but not resolve off medication but was told a few days ago to restart the Simva and be seen here.  She continues to have discomfort principally on the lateral side of the legs, left >>>right. She describes it as a hot numb feeling. The numbness is still pronounced more than the warmth. She denies swelling or erythema. She has superficial spider veins in the area of involvement of the left lateral distal thigh.She does not do exercise. She has gout and some hand arthritis, ankles and knees.    Review of Systems  Constitutional: Negative for fever, chills, diaphoresis, activity change and fatigue.  HENT: Negative for ear pain, congestion, rhinorrhea and postnasal drip.   Eyes: Negative for redness.  Respiratory: Negative for cough, chest tightness, shortness of breath and wheezing.   Cardiovascular: Negative for chest pain.       Objective:   Physical Exam  Constitutional: She is oriented to person, place, and time. She appears well-developed and well-nourished. No distress.  HENT:  Head: Normocephalic and atraumatic.  Right Ear: External ear normal.  Left Ear: External ear normal.  Nose: Nose normal.  Mouth/Throat: Oropharynx is clear and moist. No oropharyngeal exudate.  Eyes: Conjunctivae and EOM are normal. Pupils are equal, round, and reactive to light.  Neck: Normal range of motion. Neck supple. No thyromegaly present.  Cardiovascular: Normal rate, regular rhythm and normal heart sounds.   Pulmonary/Chest: Effort normal and breath sounds normal. She has no wheezes. She has no rales.  Lymphadenopathy:    She has no cervical adenopathy.  Neurological: She is alert and oriented to person, place, and time. She has  normal reflexes. No cranial nerve deficit.       Area of involvement on left lateral distal thigh approx 15cm in length by 5 cm in width to the lateral side of the knee joint ending at the approx prox lat collat lig insertion site. Right leg lateral distal thigh involvement with 5cm diam area aprox 10cm proximal to proximal insertion of the lateral collat lig. No erythema or swelling or warmth noted in either place. Left leg has superficial spider veins overlying the area of involvement, none on the right.  Skin: She is not diaphoretic.          Assessment & Plan:

## 2011-05-24 NOTE — Patient Instructions (Signed)
RTC 2 weeks for recheck. Start Prednisone 10mg  daily tomm AM. Start Co Q 10 daily.

## 2011-05-24 NOTE — Assessment & Plan Note (Signed)
Improved but still there. Will do ESR today and start on Prednisone 10mg  daily for two weeks. Also start on Co Q 10 daily.

## 2011-05-24 NOTE — Assessment & Plan Note (Signed)
Will do ESR and Uric Acid to assess levels. Try Prednisone as clinical trial to assess inflammatory problem. Add Co Q 10 to Simva regimen as she did improve with stopping Simva but has recently started back. See back in two weeks.

## 2011-05-25 LAB — SEDIMENTATION RATE: Sed Rate: 30 mm/hr — ABNORMAL HIGH (ref 0–22)

## 2011-05-25 LAB — URIC ACID: Uric Acid, Serum: 6.6 mg/dL (ref 2.4–7.0)

## 2011-06-06 ENCOUNTER — Encounter: Payer: Self-pay | Admitting: Neurology

## 2011-06-06 ENCOUNTER — Ambulatory Visit (INDEPENDENT_AMBULATORY_CARE_PROVIDER_SITE_OTHER): Payer: BC Managed Care – PPO | Admitting: Family Medicine

## 2011-06-06 ENCOUNTER — Encounter: Payer: Self-pay | Admitting: Family Medicine

## 2011-06-06 VITALS — BP 110/68 | HR 88 | Temp 97.8°F | Ht 65.0 in | Wt 204.2 lb

## 2011-06-06 DIAGNOSIS — IMO0001 Reserved for inherently not codable concepts without codable children: Secondary | ICD-10-CM

## 2011-06-06 DIAGNOSIS — M79606 Pain in leg, unspecified: Secondary | ICD-10-CM

## 2011-06-06 DIAGNOSIS — M791 Myalgia, unspecified site: Secondary | ICD-10-CM

## 2011-06-06 DIAGNOSIS — M79609 Pain in unspecified limb: Secondary | ICD-10-CM

## 2011-06-06 NOTE — Assessment & Plan Note (Signed)
Slightly improved.

## 2011-06-06 NOTE — Patient Instructions (Addendum)
Refer to Neurology. RTC if sxs worsen in the meantime.

## 2011-06-06 NOTE — Assessment & Plan Note (Signed)
Not really muscle pain but numbness that improved slightly with prednisone 10mg  daily for two weeks.  Will have pt see Neurology.

## 2011-06-06 NOTE — Progress Notes (Signed)
  Subjective:    Patient ID: Betty [name], female    DOB: [birth year], 65 y.o.   MRN: [id number]  HPI Pt here for two week follow up of lateral leg myalgia after being on Prednisone 10 since being seen. ESR was slightly elevated at 30 (Nml 22 and below)  and uric acid was also slightly elevated at 6.6. The myalgia is really a decreased sensation/numbness about the size of her hand of the left lateral distal thigh. It is mildly improved since being on the medication but still there and still essentially numb. She tolerated the medication without difficulty. She has no joint pain. She has no other complaints and feels well otherwise.    Review of Systems  Constitutional: Negative for fever, chills, diaphoresis, activity change and fatigue.  HENT: Negative for ear pain, congestion, rhinorrhea and postnasal drip.   Eyes: Negative for redness.  Respiratory: Negative for cough, chest tightness, shortness of breath and wheezing.   Cardiovascular: Negative for chest pain.  Musculoskeletal: Negative for back pain, arthralgias and gait problem.  Neurological: Positive for numbness (See HPI). Negative for dizziness, tremors, seizures, syncope, facial asymmetry, speech difficulty, weakness, light-headedness and headaches.  Psychiatric/Behavioral: Negative for hallucinations, behavioral problems, confusion, dysphoric mood, decreased concentration and agitation.       Objective:   Physical Exam        Assessment & Plan:

## 2011-06-08 ENCOUNTER — Encounter: Payer: Self-pay | Admitting: Internal Medicine

## 2011-06-08 ENCOUNTER — Ambulatory Visit (INDEPENDENT_AMBULATORY_CARE_PROVIDER_SITE_OTHER): Payer: BC Managed Care – PPO | Admitting: Internal Medicine

## 2011-06-08 DIAGNOSIS — E782 Mixed hyperlipidemia: Secondary | ICD-10-CM

## 2011-06-08 DIAGNOSIS — I428 Other cardiomyopathies: Secondary | ICD-10-CM

## 2011-06-08 NOTE — Progress Notes (Signed)
HPI Patient is a 65 year old who was last seen by Wende Mott in September.  She has a hsitory of NICM, dyslipidemia, gout, glucose intolerance.  Cath in 2003 showed normal coronaries and and LVEFof 25%. When he saw her she was complaining of LE pain.  He recomm  Stopping statin.  This did not help. He also reommended and echo.  This showed LVEF was normal   Since seen she continues to have L leg discomfort.  Breathing is OK.  No chest pain. Allergies  Allergen Reactions  . Amoxicillin     REACTION: RASH  . Sulfonamide Derivatives     REACTION: RASH    Current Outpatient Prescriptions  Medication Sig Dispense Refill  . aspirin 81 MG EC tablet Take 81 mg by mouth daily.        . Calcium Citrate-Vitamin D 200-125 MG-UNIT TABS Take by mouth daily.        . carvedilol (COREG) 12.5 MG tablet TAKE 1 TABLET TWICE A DAY  180 tablet  3  . Cholecalciferol (VITAMIN D3) 1000 UNITS tablet Take 1,000 Units by mouth daily.        . Coenzyme Q10 200 MG capsule Take 200 mg by mouth daily.        . colchicine 0.6 MG tablet Take 0.6 mg by mouth. 1 two times a day to three times a day as needed for gout       . dextromethorphan (DELSYM) 30 MG/5ML liquid Take 10 mLs (60 mg total) by mouth as needed.  89 mL    . dextromethorphan-guaiFENesin (MUCINEX DM) 30-600 MG per 12 hr tablet Take 1 tablet by mouth as needed.      Marland Kitchen losartan (COZAAR) 50 MG tablet Take 1 tablet (50 mg total) by mouth daily.  30 tablet  11  . multivitamin (THERAGRAN) per tablet Take 1 tablet by mouth daily. 1 tab two times a day      . Omega-3 Fatty Acids (FISH OIL) 1000 MG CPDR Take by mouth daily.        . potassium chloride (K-DUR,KLOR-CON) 10 MEQ tablet        . simvastatin (ZOCOR) 20 MG tablet TAKE 1 TABLET DAILY  90 tablet  3    Past Medical History  Diagnosis Date  . Hyperlipidemia   . Gout   . NICM (nonischemic cardiomyopathy)     EF 25%; improved to normal - echo 4/08: EF 50-55%, mild MR, mild LAE, mild TR;    cath 3/03: normal  cors, EF 40%  . Glucose intolerance (impaired glucose tolerance)     Past Surgical History  Procedure Date  . Nsvd     x 2  . Cystectomy 1996    l breast  . Choleystectomy 02/2002    Family History  Problem Relation Age of Onset  . Lymphoma Mother   . COPD Father   . Stroke Father   . Pneumonia Father   . Diabetes Father   . Diabetes Sister   . Depression Sister   . Meniere's disease Brother   . Diabetes Paternal Grandfather     History   Social History  . Marital Status: Married    Spouse Name: N/A    Number of Children: 2  . Years of Education: N/A   Occupational History  . Retired Runner, broadcasting/film/video   . Works at [employer] History Main Topics  . Smoking status: Never Smoker   . Smokeless tobacco: Never Used  .  Alcohol Use: No  . Drug Use: No  . Sexually Active: No   Other Topics Concern  . Not on file   Social History Narrative  . No narrative on file    Review of Systems:  All systems reviewed.  They are negative to the above problem except as previously stated.  Vital Signs: BP 130/72  Pulse 95  Ht 5\' 5"  (1.651 m)  Wt 204 lb (92.534 kg)  BMI 33.95 kg/m2  Physical Exam  Patient isin NAD  HEENT:  Normocephalic, atraumatic. EOMI, PERRLA.  Neck: JVP is normal. No thyromegaly. No bruits.  Lungs: clear to auscultation. No rales no wheezes.  Heart: Regular rate and rhythm. Normal S1, S2. No S3.   No significant murmurs. PMI not displaced.  Abdomen:  Supple, nontender. Normal bowel sounds. No masses. No hepatomegaly.  Extremities:   Good distal pulses throughout. No lower extremity edema.  Musculoskeletal :moving all extremities.  Neuro:   alert and oriented x3.  CN II-XII grossly intact.   Assessment and Plan:

## 2011-06-08 NOTE — Patient Instructions (Signed)
Your physician wants you to follow-up in:  6 months. You will receive a reminder letter in the mail two months in advance. If you don't receive a letter, please call our office to schedule the follow-up appointment.   

## 2011-06-10 NOTE — Assessment & Plan Note (Signed)
LVEF is normal.  Volume status is good.  I would keep on same regimen.

## 2011-06-10 NOTE — Assessment & Plan Note (Signed)
Keep on statin. 

## 2011-06-18 ENCOUNTER — Encounter: Payer: Self-pay | Admitting: Neurology

## 2011-06-18 ENCOUNTER — Ambulatory Visit (INDEPENDENT_AMBULATORY_CARE_PROVIDER_SITE_OTHER): Payer: BC Managed Care – PPO | Admitting: Neurology

## 2011-06-18 ENCOUNTER — Other Ambulatory Visit (INDEPENDENT_AMBULATORY_CARE_PROVIDER_SITE_OTHER): Payer: BC Managed Care – PPO

## 2011-06-18 DIAGNOSIS — R7309 Other abnormal glucose: Secondary | ICD-10-CM

## 2011-06-18 DIAGNOSIS — G609 Hereditary and idiopathic neuropathy, unspecified: Secondary | ICD-10-CM

## 2011-06-18 LAB — HEPATIC FUNCTION PANEL
ALT: 24 U/L (ref 0–35)
AST: 25 U/L (ref 0–37)
Albumin: 3.7 g/dL (ref 3.5–5.2)
Total Protein: 7.6 g/dL (ref 6.0–8.3)

## 2011-06-18 LAB — CBC WITH DIFFERENTIAL/PLATELET
Basophils Relative: 0.6 % (ref 0.0–3.0)
Eosinophils Relative: 2.1 % (ref 0.0–5.0)
HCT: 38.8 % (ref 36.0–46.0)
Lymphs Abs: 2.3 10*3/uL (ref 0.7–4.0)
MCV: 96.8 fl (ref 78.0–100.0)
Monocytes Absolute: 0.6 10*3/uL (ref 0.1–1.0)
Neutro Abs: 3.9 10*3/uL (ref 1.4–7.7)
RBC: 4.01 Mil/uL (ref 3.87–5.11)
WBC: 7 10*3/uL (ref 4.5–10.5)

## 2011-06-18 NOTE — Patient Instructions (Signed)
Go to the basement to have labs drawn today.  Your nerve conduction studies/electromyelogram are scheduled for Monday, October 29th at 9:45am at  [facility] [address] [place] [phone].  We will call you in about one month with your follow up appointment to see Dr. Modesto Charon.

## 2011-06-18 NOTE — Progress Notes (Signed)
Dear Dr. Hetty Ely,  Thank you for having me see Betty [name] in consultation today at [facility] for her problem with bilateral leg numbness.  As you may recall, she is a 65 y.o. year old female with a history of idiopathic cardiomyopathy who presents with bilateral leg numbness, tingling and burning, worse on the left lateral thigh but present on the right.  This has been going on about 3 years, with worsening over the last 3 months when she was started on Micardis.  She was switched to losartan 6 weeks ago with some improvement.  She denies back pain.  She doesn't think it has symptomatically related to fluctuating weight as her weight has gone "steadily up".  She denies a change in undergarments or leaning against countertops.  Past Medical History  Diagnosis Date  . Hyperlipidemia   . Gout   . NICM (nonischemic cardiomyopathy)     EF 25%; improved to normal - echo 4/08: EF 50-55%, mild MR, mild LAE, mild TR;    cath 3/03: normal cors, EF 40%  . Glucose intolerance (impaired glucose tolerance)     Past Surgical History  Procedure Date  . Nsvd     x 2  . Cystectomy 1996    l breast  . Choleystectomy 02/2002    History   Social History  . Marital Status: Married    Spouse Name: N/A    Number of Children: 2  . Years of Education: N/A   Occupational History  . Retired Runner, broadcasting/film/video   . Works at [employer] History Main Topics  . Smoking status: Never Smoker   . Smokeless tobacco: Never Used  . Alcohol Use: No  . Drug Use: No  . Sexually Active: No   Other Topics Concern  . None   Social History Narrative  . None    Family History  Problem Relation Age of Onset  . Lymphoma Mother   . COPD Father   . Stroke Father   . Pneumonia Father   . Diabetes Father   . Diabetes Sister   . Depression Sister   . Meniere's disease Brother   . Diabetes Paternal Grandfather       ROS:  13 systems were reviewed and are notable for swelling in hands and  feet as well as shortness of breath.  She does get numbness and tingling in her feet but not her hands.  All other review of systems are unremarkable.   Examination:  Filed Vitals:   06/18/11 1317  BP: 110/70  Pulse: 84  Height: 5\' 5"  (1.651 m)  Weight: 206 lb (93.441 kg)     In general, well appearing older woman who is obese.  Cardiovascular: The patient has a regular rate and rhythm..  Fundoscopy:  Disks are flat. Vessel caliber within normal limits.  Mental status:   The patient is oriented to person, place and time. Recent and remote memory are intact. Attention span and concentration are normal. Language including repetition, naming, following commands are intact. Fund of knowledge of current and historical events, as well as vocabulary are normal.  Cranial Nerves: Pupils are equally round and reactive to light. Visual fields full to confrontation. Extraocular movements are intact without nystagmus. Facial sensation and muscles of mastication are intact. Muscles of facial expression are symmetric. Hearing intact to bilateral finger rub. Tongue protrusion, uvula, palate midline.  Shoulder shrug intact  Motor:  The patient has normal bulk and tone, no pronator drift.  There are no adventitious movements.  5/5 muscle strength.  Reflexes:   Biceps  Triceps Brachioradialis Knee Ankle  Right 2+  2+  2+   2+ 2+  Left  2+  2+  2+   2+ 2+  Toes down  Coordination:  Normal finger to nose.  No dysdiadokinesia.  Sensation is decreased in a patch above the knee on the left lateral thigh.  Vibration decreased in the toes and ankles.  ?temperature decreased in the feet.  Gait and Station are normal.  Tandem gait is slightly impaired.  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  She also has findings consistent with a length dependent neuropathy.  She has had increased fasting glucose in the past.  I  will get PN labs today and an EMG/NCS to look at her peripheral nerves.  It would be great if they could confirm the Meralgia Parasthetica but I know this is technically difficult but at least we can rule out an L3 radiculopathy and confirm her length dependent neuropathy.  I will see her back in 2 months.  She feels that her pain right now is bearable, but if it gets worse we can try neurontin for neuropathic pain treament.    Thank you for having [place] see Betty [name] in consultation.  Feel free to contact me with any questions.  Lupita Raider Modesto Charon, MD [facility], [place] [address] [place], [place] [address] Phone: [phone] Fax: [phone].

## 2011-06-20 LAB — PROTEIN ELECTROPHORESIS, SERUM
Alpha-1-Globulin: 4.5 % (ref 2.9–4.9)
Alpha-2-Globulin: 13 % — ABNORMAL HIGH (ref 7.1–11.8)
Total Protein, Serum Electrophoresis: 7.1 g/dL (ref 6.0–8.3)

## 2011-06-24 LAB — METHYLMALONIC ACID, SERUM: Methylmalonic Acid, Quantitative: 110 nmol/L (ref 87–318)

## 2011-06-27 NOTE — Progress Notes (Signed)
EMG/NCS - No L3 or femoral neuropathy,  surprisingly surals were normal.  Dr. Anne Hahn thought clinical consistent with meralgia parasethetica.

## 2011-06-28 ENCOUNTER — Encounter: Payer: Self-pay | Admitting: Neurology

## 2011-06-30 ENCOUNTER — Encounter: Payer: Self-pay | Admitting: Neurology

## 2011-07-11 ENCOUNTER — Ambulatory Visit: Payer: BC Managed Care – PPO | Admitting: Family Medicine

## 2011-08-17 ENCOUNTER — Encounter: Payer: Self-pay | Admitting: Neurology

## 2011-08-17 ENCOUNTER — Ambulatory Visit (INDEPENDENT_AMBULATORY_CARE_PROVIDER_SITE_OTHER): Payer: BC Managed Care – PPO | Admitting: Neurology

## 2011-08-17 VITALS — BP 112/74 | HR 88 | Wt 203.0 lb

## 2011-08-17 DIAGNOSIS — G5712 Meralgia paresthetica, left lower limb: Secondary | ICD-10-CM

## 2011-08-17 DIAGNOSIS — G5710 Meralgia paresthetica, unspecified lower limb: Secondary | ICD-10-CM

## 2011-08-20 DIAGNOSIS — G5712 Meralgia paresthetica, left lower limb: Secondary | ICD-10-CM | POA: Insufficient documentation

## 2011-08-20 NOTE — Progress Notes (Signed)
Dear Dr. Hetty Ely,  I saw  Betty [name] back in [facility] for her problem with bilateral thigh numbness.  As you may recall, she is a 65 y.o. year old female with a history of idiopathic cardiomyopathy who presents with bilateral leg numbness, tingling and burning, worse on the left lateral thigh but present on the right.  I felt she likely had entrapment of the lateral femoral cutaneous nerve(meralgia paresthetica), but ordered an EMG/NCS to rule out an L3-L4 radiculopathy.  The EMG/NCS revealed a chronic left S1 radiculopathy.  There was also signs of a left peroneal neuropathy below the innervation of the anterior tibialis which was thought to be possibly due to a cystic structure seen at the EDB.  No L3-L4 radic was seen.  The patient says that her condition has improved.  She only has a slight numbness in the left lateral knee that is not bothersome.  She cannot explain why she has improved.  There has been no significant weight loss.  She continues to attribute the start of her problems to taking the Micardis, which she has since stopped.     Medical history, social history, and family history were reviewed and have not changed since the last clinic visit.  Current Outpatient Prescriptions on File Prior to Visit  Medication Sig Dispense Refill  . aspirin 81 MG EC tablet Take 81 mg by mouth daily.        . Calcium Citrate-Vitamin D 200-125 MG-UNIT TABS Take by mouth daily.        . carvedilol (COREG) 12.5 MG tablet TAKE 1 TABLET TWICE A DAY  180 tablet  3  . Cholecalciferol (VITAMIN D3) 1000 UNITS tablet Take 1,000 Units by mouth daily.        . Coenzyme Q10 200 MG capsule Take 200 mg by mouth daily.        . colchicine 0.6 MG tablet Take 0.6 mg by mouth. 1 two times a day to three times a day as needed for gout       . dextromethorphan (DELSYM) 30 MG/5ML liquid Take 10 mLs (60 mg total) by mouth as needed.  89 mL    . dextromethorphan-guaiFENesin (MUCINEX DM) 30-600 MG  per 12 hr tablet Take 1 tablet by mouth as needed.      Marland Kitchen losartan (COZAAR) 50 MG tablet Take 1 tablet (50 mg total) by mouth daily.  30 tablet  11  . multivitamin (THERAGRAN) per tablet Take 1 tablet by mouth daily. 1 tab two times a day      . Omega-3 Fatty Acids (FISH OIL) 1000 MG CPDR Take by mouth daily.        . potassium chloride (K-DUR,KLOR-CON) 10 MEQ tablet        . simvastatin (ZOCOR) 20 MG tablet TAKE 1 TABLET DAILY  90 tablet  3    Allergies  Allergen Reactions  . Amoxicillin     REACTION: RASH  . Sulfonamide Derivatives     REACTION: RASH    ROS:  13 systems were reviewed and are  unremarkable.  Exam: . Filed Vitals:   08/17/11 1423  BP: 112/74  Pulse: 88  Weight: 203 lb (92.08 kg)    In general, well appearing older women.   Cranial Nerves: Pupils are equally round and reactive to light. Visual fields full to confrontation. Extraocular movements are intact without nystagmus. Facial sensation and muscles of mastication are intact. Muscles of facial expression are symmetric. Hearing intact to bilateral  finger rub. Tongue protrusion, uvula, palate midline.  Shoulder shrug intact  Motor:  Normal bulk and tone, no drift and 5/5 muscle strength bilaterally.  Reflexes:  2+ thoughout.  Coordination:  Normal finger to nose  Gait:  Normal gait and station.  Romberg negative.  Impression:  Left sided entrapment of the lateral femoral cutaneous nerve(meralgia parasthetica).  This is a benign phenomenon.  She has no signs of a radiculopathy causing the numbness of her left knee.  Given she has improved I would just watch her.  If she were to get more pain and dysesthesias a trial of Neurontin, Elavil or pregabalin would be advised.  I only need to see her back on an as needed basis.  Lupita Raider Modesto Charon, MD [facility], [place]

## 2011-10-29 ENCOUNTER — Encounter: Payer: Self-pay | Admitting: Internal Medicine

## 2011-10-29 ENCOUNTER — Ambulatory Visit (INDEPENDENT_AMBULATORY_CARE_PROVIDER_SITE_OTHER): Payer: BC Managed Care – PPO | Admitting: Internal Medicine

## 2011-10-29 DIAGNOSIS — Z1239 Encounter for other screening for malignant neoplasm of breast: Secondary | ICD-10-CM

## 2011-10-29 DIAGNOSIS — E782 Mixed hyperlipidemia: Secondary | ICD-10-CM

## 2011-10-29 DIAGNOSIS — R7309 Other abnormal glucose: Secondary | ICD-10-CM

## 2011-10-29 DIAGNOSIS — Z1211 Encounter for screening for malignant neoplasm of colon: Secondary | ICD-10-CM

## 2011-10-29 DIAGNOSIS — E785 Hyperlipidemia, unspecified: Secondary | ICD-10-CM

## 2011-10-29 DIAGNOSIS — I428 Other cardiomyopathies: Secondary | ICD-10-CM

## 2011-10-29 DIAGNOSIS — I10 Essential (primary) hypertension: Secondary | ICD-10-CM

## 2011-10-29 LAB — COMPREHENSIVE METABOLIC PANEL
CO2: 30 mEq/L (ref 19–32)
Calcium: 9.4 mg/dL (ref 8.4–10.5)
Creatinine, Ser: 0.7 mg/dL (ref 0.4–1.2)
GFR: 89.34 mL/min (ref >60.00)
Glucose, Bld: 96 mg/dL (ref 70–99)
Total Bilirubin: 0.5 mg/dL (ref 0.3–1.2)
Total Protein: 7.9 g/dL (ref 6.0–8.3)

## 2011-10-29 LAB — CBC
HCT: 42.1 % (ref 36.0–46.0)
Hemoglobin: 14.5 g/dL (ref 12.0–15.0)
Platelets: 223 10*3/uL (ref 150.0–400.0)
RBC: 4.36 Mil/uL (ref 3.87–5.11)
WBC: 8 10*3/uL (ref 4.5–10.5)

## 2011-10-29 LAB — LIPID PANEL
Cholesterol: 171 mg/dL (ref 0–200)
HDL: 65.6 mg/dL (ref >39.00)
VLDL: 61 mg/dL — ABNORMAL HIGH (ref 0.0–40.0)

## 2011-10-29 MED ORDER — SIMVASTATIN 20 MG PO TABS: 20.0000 mg | ORAL_TABLET | Freq: Every day | ORAL | Status: DC

## 2011-10-29 MED ORDER — CARVEDILOL 12.5 MG PO TABS: 12.5000 mg | ORAL_TABLET | Freq: Two times a day (BID) | ORAL | Status: DC

## 2011-10-29 MED ORDER — LOSARTAN POTASSIUM 50 MG PO TABS: 50.0000 mg | ORAL_TABLET | Freq: Every day | ORAL | Status: DC

## 2011-10-29 MED ORDER — POTASSIUM CHLORIDE CRYS ER 10 MEQ PO TBCR: 10.0000 meq | EXTENDED_RELEASE_TABLET | Freq: Every day | ORAL | Status: DC

## 2011-10-29 NOTE — Assessment & Plan Note (Signed)
Will check fasting glucose with labs today. Follow up 6 months.

## 2011-10-29 NOTE — Assessment & Plan Note (Signed)
Will recheck lipids with labs today. Will also check LFTs. Goal LDL less than 100. Followup 6 months.

## 2011-10-29 NOTE — Assessment & Plan Note (Signed)
Recent LVEF was normal. Appears euvolemic on exam today. We'll continue carvedilol and losartan. Renal function with labs today. Followup in 6 months.

## 2011-10-29 NOTE — Progress Notes (Signed)
Subjective:    Patient ID: Betty [name], female    DOB: Jun 27, 1946, 66 y.o.   MRN: [id number]  HPI 66 year old female with history of nonischemic cardiomyopathy, hyperlipidemia, and hyperglycemia presents for followup. She reports that she is generally feeling well. She denies any complaints today. In regards to her history of cardiomyopathy, she denies any current shortness of breath or lower extremity edema. She denies any chest pain or palpitations. She notes that she lives a sedentary lifestyle but is able to walk several 100 yards without any difficulty or shortness of breath. She does have shortness of breath when walking up 1 flight of stairs. She uses one pillow when sleeping.  In regards to hyperlipidemia, she reports compliance with simvastatin. She denies any change in her chronic left leg pain. This in the past did not improve with stopping statin medication.  She would like to schedule colonoscopy. She notes that she has never been screened with colonoscopy for colon cancer. She has had stool occult blood testing in the past which has been negative.  Outpatient Encounter Prescriptions as of 10/29/2011  Medication Sig Dispense Refill  . aspirin 81 MG EC tablet Take 81 mg by mouth daily.        . Calcium Citrate-Vitamin D 200-125 MG-UNIT TABS Take by mouth daily.        . carvedilol (COREG) 12.5 MG tablet Take 1 tablet (12.5 mg total) by mouth 2 (two) times daily with a meal.  180 tablet  3  . Cholecalciferol (VITAMIN D3) 1000 UNITS tablet Take 1,000 Units by mouth daily.        . Coenzyme Q10 200 MG capsule Take 200 mg by mouth daily.        . colchicine 0.6 MG tablet Take 0.6 mg by mouth. 1 two times a day to three times a day as needed for gout       . dextromethorphan (DELSYM) 30 MG/5ML liquid Take 10 mLs (60 mg total) by mouth as needed.  89 mL    . dextromethorphan-guaiFENesin (MUCINEX DM) 30-600 MG per 12 hr tablet Take 1 tablet by mouth as needed.      Marland Kitchen losartan (COZAAR) 50  MG tablet Take 1 tablet (50 mg total) by mouth daily.  90 tablet  3  . multivitamin (THERAGRAN) per tablet Take 1 tablet by mouth daily. 1 tab two times a day      . Omega-3 Fatty Acids (FISH OIL) 1000 MG CPDR Take by mouth daily.        . potassium chloride (K-DUR,KLOR-CON) 10 MEQ tablet Take 1 tablet (10 mEq total) by mouth daily.  90 tablet  3  . simvastatin (ZOCOR) 20 MG tablet Take 1 tablet (20 mg total) by mouth daily.  90 tablet  3    Review of Systems  Constitutional: Negative for fever, chills, appetite change, fatigue and unexpected weight change.  HENT: Negative for ear pain, congestion, sore throat, trouble swallowing, neck pain, voice change and sinus pressure.   Eyes: Negative for visual disturbance.  Respiratory: Negative for cough, shortness of breath, wheezing and stridor.   Cardiovascular: Negative for chest pain, palpitations and leg swelling.  Gastrointestinal: Negative for nausea, vomiting, abdominal pain, diarrhea, constipation, blood in stool, abdominal distention and anal bleeding.  Genitourinary: Negative for dysuria and flank pain.  Musculoskeletal: Positive for myalgias. Negative for arthralgias and gait problem.  Skin: Negative for color change and rash.  Neurological: Negative for dizziness and headaches.  Hematological: Negative for adenopathy.  Does not bruise/bleed easily.  Psychiatric/Behavioral: Negative for suicidal ideas, sleep disturbance and dysphoric mood. The patient is not nervous/anxious.    BP 118/68  Pulse 90  Temp(Src) 97.4 F (36.3 C) (Oral)  Ht 5\' 5"  (1.651 m)  Wt 204 lb (92.534 kg)  BMI 33.95 kg/m2  SpO2 93%     Objective:   Physical Exam  Constitutional: She is oriented to person, place, and time. She appears well-developed and well-nourished. No distress.  HENT:  Head: Normocephalic and atraumatic.  Right Ear: External ear normal.  Left Ear: External ear normal.  Nose: Nose normal.  Mouth/Throat: Oropharynx is clear and moist. No  oropharyngeal exudate.  Eyes: Conjunctivae are normal. Pupils are equal, round, and reactive to light. Right eye exhibits no discharge. Left eye exhibits no discharge. No scleral icterus.  Neck: Normal range of motion. Neck supple. No tracheal deviation present. No thyromegaly present.  Cardiovascular: Normal rate, regular rhythm, normal heart sounds and intact distal pulses.  Exam reveals no gallop and no friction rub.   No murmur heard. Pulmonary/Chest: Effort normal and breath sounds normal. No respiratory distress. She has no wheezes. She has no rales. She exhibits no tenderness.  Abdominal: Soft. Bowel sounds are normal. She exhibits no distension and no mass. There is no tenderness. There is no rebound and no guarding.  Musculoskeletal: Normal range of motion. She exhibits no edema and no tenderness.  Lymphadenopathy:    She has no cervical adenopathy.  Neurological: She is alert and oriented to person, place, and time. No cranial nerve deficit. She exhibits normal muscle tone. Coordination normal.  Skin: Skin is warm and dry. No rash noted. She is not diaphoretic. No erythema. No pallor.  Psychiatric: She has a normal mood and affect. Her behavior is normal. Judgment and thought content normal.          Assessment & Plan:

## 2011-11-01 ENCOUNTER — Encounter: Payer: Self-pay | Admitting: Internal Medicine

## 2011-11-01 ENCOUNTER — Ambulatory Visit (AMBULATORY_SURGERY_CENTER): Payer: BC Managed Care – PPO | Admitting: *Deleted

## 2011-11-01 VITALS — Ht 65.0 in | Wt 204.1 lb

## 2011-11-01 DIAGNOSIS — Z1211 Encounter for screening for malignant neoplasm of colon: Secondary | ICD-10-CM

## 2011-11-01 MED ORDER — PEG-KCL-NACL-NASULF-NA ASC-C 100 G PO SOLR: ORAL | Status: DC

## 2011-11-06 ENCOUNTER — Ambulatory Visit: Payer: Self-pay | Admitting: Internal Medicine

## 2011-11-06 LAB — HM MAMMOGRAPHY: HM Mammogram: NORMAL

## 2011-11-13 ENCOUNTER — Ambulatory Visit (AMBULATORY_SURGERY_CENTER): Payer: BC Managed Care – PPO | Admitting: Internal Medicine

## 2011-11-13 ENCOUNTER — Encounter: Payer: Self-pay | Admitting: Internal Medicine

## 2011-11-13 VITALS — BP 134/84 | HR 87 | Temp 96.2°F | Resp 20 | Ht 65.0 in | Wt 204.0 lb

## 2011-11-13 DIAGNOSIS — D126 Benign neoplasm of colon, unspecified: Secondary | ICD-10-CM

## 2011-11-13 DIAGNOSIS — Z1211 Encounter for screening for malignant neoplasm of colon: Secondary | ICD-10-CM

## 2011-11-13 MED ORDER — SODIUM CHLORIDE 0.9 % IV SOLN: 500.0000 mL | INTRAVENOUS | Status: DC

## 2011-11-13 NOTE — Progress Notes (Signed)
Patient did not experience any of the following events: a burn prior to discharge; a fall within the facility; wrong site/side/patient/procedure/implant event; or a hospital transfer or hospital admission upon discharge from the facility. (G8907) Patient did not have preoperative order for IV antibiotic SSI prophylaxis. (G8918)  

## 2011-11-13 NOTE — Patient Instructions (Signed)
Discharge instructions given with verbal understanding. Handout on polyps given. Resume previous medications. YOU HAD AN ENDOSCOPIC PROCEDURE TODAY AT THE [facility]: Refer to the procedure report that was given to you for any specific questions about what was found during the examination.  If the procedure report does not answer your questions, please call your gastroenterologist to clarify.  If you requested that your care partner not be given the details of your procedure findings, then the procedure report has been included in a sealed envelope for you to review at your convenience later.  YOU SHOULD EXPECT: Some feelings of bloating in the abdomen. Passage of more gas than usual.  Walking can help get rid of the air that was put into your GI tract during the procedure and reduce the bloating. If you had a lower endoscopy (such as a colonoscopy or flexible sigmoidoscopy) you may notice spotting of blood in your stool or on the toilet paper. If you underwent a bowel prep for your procedure, then you may not have a normal bowel movement for a few days.  DIET: Your first meal following the procedure should be a light meal and then it is ok to progress to your normal diet.  A half-sandwich or bowl of soup is an example of a good first meal.  Heavy or fried foods are harder to digest and may make you feel nauseous or bloated.  Likewise meals heavy in dairy and vegetables can cause extra gas to form and this can also increase the bloating.  Drink plenty of fluids but you should avoid alcoholic beverages for 24 hours.  ACTIVITY: Your care partner should take you home directly after the procedure.  You should plan to take it easy, moving slowly for the rest of the day.  You can resume normal activity the day after the procedure however you should NOT DRIVE or use heavy machinery for 24 hours (because of the sedation medicines used during the test).    SYMPTOMS TO REPORT IMMEDIATELY: A  gastroenterologist can be reached at any hour.  During normal business hours, 8:30 AM to 5:00 PM Monday through Friday, call [phone].  After hours and on weekends, please call the GI answering service at [phone] who will take a message and have the physician on call contact you.   Following lower endoscopy (colonoscopy or flexible sigmoidoscopy):  Excessive amounts of blood in the stool  Significant tenderness or worsening of abdominal pains  Swelling of the abdomen that is new, acute  Fever of 100F or higher  FOLLOW UP: If any biopsies were taken you will be contacted by phone or by letter within the next 1-3 weeks.  Call your gastroenterologist if you have not heard about the biopsies in 3 weeks.  Our staff will call the home number listed on your records the next business day following your procedure to check on you and address any questions or concerns that you may have at that time regarding the information given to you following your procedure. This is a courtesy call and so if there is no answer at the home number and we have not heard from you through the emergency physician on call, we will assume that you have returned to your regular daily activities without incident.  SIGNATURES/CONFIDENTIALITY: You and/or your care partner have signed paperwork which will be entered into your electronic medical record.  These signatures attest to the fact that that the information above on your After Visit Summary has   been reviewed and is understood.  Full responsibility of the confidentiality of this discharge information lies with you and/or your care-partner. 

## 2011-11-13 NOTE — Op Note (Signed)
[facility] [address] [place], [place]  [address]  COLONOSCOPY PROCEDURE REPORT  PATIENT:  Betty [name], Betty [name]  MR#:  [id number] BIRTHDATE:  1946-01-28, 65 yrs. old  GENDER:  female ENDOSCOPIST:  Carie Caddy. Kindall Swaby, MD REF. BY:  Ronna Polio, MD PROCEDURE DATE:  11/13/2011 PROCEDURE:  Colonoscopy with snare polypectomy ASA CLASS:  Class II INDICATIONS:  Routine Risk Screening, 1st colonoscopy MEDICATIONS:   These medications were titrated to patient response per physician's verbal order, Versed 8 mg IV, Fentanyl 75 mcg IV  DESCRIPTION OF PROCEDURE:   After the risks benefits and alternatives of the procedure were thoroughly explained, informed consent was obtained.  Digital rectal exam was performed and revealed no rectal masses.   The [id number] endoscope was introduced through the anus and advanced to the cecum, which was identified by both the appendix and ileocecal valve, without limitations.  The quality of the prep was good, using MoviPrep. The instrument was then slowly withdrawn as the colon was fully examined. <<PROCEDUREIMAGES>>  FINDINGS:  Two sessile polyps measuring 5 mm were found in the cecum. Polyps were snared without cautery. Retrieval was successful.  A 5 mm sessile polyp was found at the ileocecal valve. Polyp was snared without cautery. Retrieval was successful. A 6 mm sessile polyp was found in the descending colon. Polyp was snared without cautery. Retrieval was successful.   Mild diverticulosis was found ascending colon, descending colon and sigmoid colon.   Retroflexed views in the rectum revealed no abnormalities.  The scope was then withdrawn from the cecum and the procedure completed.  COMPLICATIONS:  None ENDOSCOPIC IMPRESSION: 1) Two polyps in the cecum.  Removed and sent to pathology. 2) Sessile polyp at the ileocecal valve. Removed and sent to pathology. 3) Sessile polyp in the descending colon. Removed and sent to pathology. 4)  Mild diverticulosis ascending colon, descending colon and sigmoid colon  RECOMMENDATIONS: 1) Hold aspirin, aspirin products, and anti-inflammatory medication for 1 week. 2) Await pathology results 3) High fiber diet. 4) If the polyps removed today are proven to be adenomatous (pre-cancerous) polyps, you will need a colonoscopy in 3 years. Otherwise you should continue to follow colorectal cancer screening guidelines for "routine risk" patients with a colonoscopy in 10 years. You will receive a letter within 1-2 weeks with the results of your biopsy as well as final recommendations. Please call my office if you have not received a letter after 3 weeks.  Carie Caddy. Rhea Belton, MD  CC:  Ronna Polio MD The Patient  n. eSIGNEDCarie Caddy. Renzo Vincelette at 11/13/2011 10:08 AM  Isabella Bowens, [phone]

## 2011-11-14 ENCOUNTER — Telehealth: Payer: Self-pay | Admitting: *Deleted

## 2011-11-14 NOTE — Telephone Encounter (Signed)
  Follow up Call-  Call back number 11/13/2011  Post procedure Call Back phone  # [phone]  Permission to leave phone message Yes     Patient questions:  Do you have a fever, pain , or abdominal swelling? no Pain Score  0 *  Have you tolerated food without any problems? yes  Have you been able to return to your normal activities? yes  Do you have any questions about your discharge instructions: Diet   no Medications  no Follow up visit  no  Do you have questions or concerns about your Care? no  Actions: * If pain score is 4 or above: No action needed, pain <4. Spoke with husband.

## 2011-11-20 ENCOUNTER — Encounter: Payer: Self-pay | Admitting: Internal Medicine

## 2011-12-19 ENCOUNTER — Encounter: Payer: Self-pay | Admitting: Internal Medicine

## 2011-12-26 LAB — HM COLONOSCOPY

## 2012-02-25 ENCOUNTER — Ambulatory Visit (INDEPENDENT_AMBULATORY_CARE_PROVIDER_SITE_OTHER): Payer: Medicare Other | Admitting: Internal Medicine

## 2012-02-25 ENCOUNTER — Encounter: Payer: Self-pay | Admitting: Internal Medicine

## 2012-02-25 ENCOUNTER — Ambulatory Visit (INDEPENDENT_AMBULATORY_CARE_PROVIDER_SITE_OTHER)
Admission: RE | Admit: 2012-02-25 | Discharge: 2012-02-25 | Disposition: A | Discharge status: Home / Self Care | Payer: Medicare Other | Source: Ambulatory Visit | Attending: Internal Medicine | Admitting: Internal Medicine

## 2012-02-25 VITALS — BP 120/72 | HR 81 | Temp 98.4°F | Ht 65.0 in | Wt 204.8 lb

## 2012-02-25 DIAGNOSIS — M25511 Pain in right shoulder: Secondary | ICD-10-CM

## 2012-02-25 DIAGNOSIS — M25519 Pain in unspecified shoulder: Secondary | ICD-10-CM

## 2012-02-25 MED ORDER — PREDNISONE (PAK) 10 MG PO TABS: ORAL_TABLET | ORAL | Status: AC

## 2012-02-25 MED ORDER — HYDROCODONE-ACETAMINOPHEN 5-500 MG PO TABS: 1.0000 | ORAL_TABLET | Freq: Three times a day (TID) | ORAL | Status: AC | PRN

## 2012-02-25 NOTE — Assessment & Plan Note (Addendum)
Symptoms are most consistent with adhesive capsulitis or osteoarthritis of the right shoulder. Will get plain x-ray. Will start prednisone taper. We'll use hydrocodone for severe pain. Depending on results of x-ray, May set up evaluation by orthopedics.

## 2012-02-25 NOTE — Progress Notes (Signed)
Subjective:    Patient ID: Betty [name], female    DOB: [birth year], 66 y.o.   MRN: [id number]  HPI 66 year old female with history of cardiomyopathy presents for acute visit complaining of right shoulder pain. This is been present for about 2 months. She denies any injury to her shoulder. She notes pain with the range of motion and limited abduction of her right arm. She denies any swelling in her right shoulder. She denies weakness in her right arm. She has not been taking any medication for this. The pain does occasionally keep her awake at night.  Outpatient Encounter Prescriptions as of 02/25/2012  Medication Sig Dispense Refill  . aspirin 81 MG EC tablet Take 81 mg by mouth daily.        . Calcium Citrate-Vitamin D 200-125 MG-UNIT TABS Take by mouth daily.        . carvedilol (COREG) 12.5 MG tablet Take 1 tablet (12.5 mg total) by mouth 2 (two) times daily with a meal.  180 tablet  3  . Cholecalciferol (VITAMIN D3) 1000 UNITS tablet Take 1,000 Units by mouth daily.        . Coenzyme Q10 200 MG capsule Take 200 mg by mouth daily.        . colchicine 0.6 MG tablet Take 0.6 mg by mouth. 1 two times a day to three times a day as needed for gout       . dextromethorphan (DELSYM) 30 MG/5ML liquid Take 10 mLs (60 mg total) by mouth as needed.  89 mL    . dextromethorphan-guaiFENesin (MUCINEX DM) 30-600 MG per 12 hr tablet Take 1 tablet by mouth as needed.      Marland Kitchen losartan (COZAAR) 50 MG tablet Take 1 tablet (50 mg total) by mouth daily.  90 tablet  3  . multivitamin (THERAGRAN) per tablet Take 1 tablet by mouth daily. 1 tab two times a day      . Omega-3 Fatty Acids (FISH OIL) 1000 MG CPDR Take by mouth daily.        . potassium chloride (K-DUR,KLOR-CON) 10 MEQ tablet Take 1 tablet (10 mEq total) by mouth daily.  90 tablet  3  . simvastatin (ZOCOR) 20 MG tablet Take 1 tablet (20 mg total) by mouth daily.  90 tablet  3  . HYDROcodone-acetaminophen (VICODIN) 5-500 MG per tablet Take 1 tablet by  mouth every 8 (eight) hours as needed for pain.  20 tablet  0  . predniSONE (STERAPRED UNI-PAK) 10 MG tablet Take 60mg  day 1 then taper by 10mg  daily  21 tablet  0    Review of Systems  Constitutional: Negative for fever, chills, appetite change, fatigue and unexpected weight change.  HENT: Negative for neck pain.   Eyes: Negative for visual disturbance.  Respiratory: Negative for cough, shortness of breath, wheezing and stridor.   Cardiovascular: Negative for chest pain.  Genitourinary: Negative for dysuria and flank pain.  Musculoskeletal: Positive for myalgias and arthralgias. Negative for gait problem.  Skin: Negative for color change and rash.  Neurological: Negative for dizziness and headaches.  Hematological: Negative for adenopathy. Does not bruise/bleed easily.  Psychiatric/Behavioral: Negative for suicidal ideas, disturbed wake/sleep cycle and dysphoric mood. The patient is not nervous/anxious.    BP 120/72  Pulse 81  Temp 98.4 F (36.9 C) (Oral)  Ht 5\' 5"  (1.651 m)  Wt 204 lb 12 oz (92.874 kg)  BMI 34.07 kg/m2  SpO2 96%     Objective:   Physical Exam  Constitutional: She is oriented to person, place, and time. She appears well-developed and well-nourished. No distress.  HENT:  Head: Normocephalic and atraumatic.  Right Ear: External ear normal.  Left Ear: External ear normal.  Nose: Nose normal.  Mouth/Throat: Oropharynx is clear and moist. No oropharyngeal exudate.  Eyes: Conjunctivae are normal. Pupils are equal, round, and reactive to light. Right eye exhibits no discharge. Left eye exhibits no discharge. No scleral icterus.  Neck: Normal range of motion. Neck supple. No tracheal deviation present. No thyromegaly present.  Cardiovascular: Normal rate, regular rhythm, normal heart sounds and intact distal pulses.  Exam reveals no gallop and no friction rub.   No murmur heard. Pulmonary/Chest: Effort normal and breath sounds normal. No respiratory distress. She has  no wheezes. She has no rales. She exhibits no tenderness.  Musculoskeletal: She exhibits no edema and no tenderness.       Right shoulder: She exhibits decreased range of motion, bony tenderness and pain. She exhibits normal strength.  Lymphadenopathy:    She has no cervical adenopathy.  Neurological: She is alert and oriented to person, place, and time. No cranial nerve deficit. She exhibits normal muscle tone. Coordination normal.  Skin: Skin is warm and dry. No rash noted. She is not diaphoretic. No erythema. No pallor.  Psychiatric: She has a normal mood and affect. Her behavior is normal. Judgment and thought content normal.          Assessment & Plan:

## 2012-02-27 ENCOUNTER — Telehealth: Payer: Self-pay | Admitting: Internal Medicine

## 2012-02-27 DIAGNOSIS — M25511 Pain in right shoulder: Secondary | ICD-10-CM

## 2012-02-27 NOTE — Telephone Encounter (Signed)
Pt stated she received a my chart notice that she needs to get a referral orthopedic.  Pt stated her husband used [facility] with dr frank aluiso.

## 2012-02-27 NOTE — Telephone Encounter (Signed)
I don't see a referral in EPIC for this please advise.

## 2012-02-27 NOTE — Telephone Encounter (Signed)
Dr. Dan Humphreys has referral already been done for this patient?  Please advise.

## 2012-02-27 NOTE — Telephone Encounter (Signed)
Order placed

## 2012-02-29 NOTE — Telephone Encounter (Signed)
Patient advised as instructed via telephone, she will wait to hear from Vanessa. 

## 2012-03-06 ENCOUNTER — Ambulatory Visit (INDEPENDENT_AMBULATORY_CARE_PROVIDER_SITE_OTHER): Payer: Medicare Other | Admitting: *Deleted

## 2012-03-06 DIAGNOSIS — Z23 Encounter for immunization: Secondary | ICD-10-CM

## 2012-04-09 LAB — HM DIABETES EYE EXAM: HM Diabetic Eye Exam: NORMAL

## 2012-05-01 ENCOUNTER — Encounter: Payer: Self-pay | Admitting: Internal Medicine

## 2012-05-01 ENCOUNTER — Ambulatory Visit (INDEPENDENT_AMBULATORY_CARE_PROVIDER_SITE_OTHER): Payer: Medicare Other | Admitting: Internal Medicine

## 2012-05-01 VITALS — BP 124/80 | HR 90 | Temp 98.2°F | Ht 65.0 in | Wt 204.8 lb

## 2012-05-01 DIAGNOSIS — Z23 Encounter for immunization: Secondary | ICD-10-CM

## 2012-05-01 DIAGNOSIS — H9209 Otalgia, unspecified ear: Secondary | ICD-10-CM

## 2012-05-01 DIAGNOSIS — M25519 Pain in unspecified shoulder: Secondary | ICD-10-CM

## 2012-05-01 DIAGNOSIS — H9201 Otalgia, right ear: Secondary | ICD-10-CM

## 2012-05-01 DIAGNOSIS — M25511 Pain in right shoulder: Secondary | ICD-10-CM

## 2012-05-01 NOTE — Progress Notes (Signed)
Subjective:    Patient ID: Betty [name], female    DOB: 21-Apr-1946, 66 y.o.   MRN: [id number]  HPI 66 year old female with history of hypertension and hyperlipidemia presents for followup. At her last visit, she was assessed her right shoulder pain. X-ray showed narrowing of her right shoulder joint. She was ultimately seen by orthopedics and had repeat x-rays which reportedly showed bone spur. She was given a steroid injection into her right shoulder but reports no benefit with this. She is not currently taking anything for pain. She reports pain is manageable without medication. She does however have difficulty performing ADLs because of limited range of motion in her right shoulder.   She is also concerned today about several month history of right ear pain and diminished hearing. She reports feeling pressure sensation in her right ear and is unable to "pop "her eardrum. She denies any fever, chills, nasal congestion, trauma to her ear. She denies drainage from the ear.  Outpatient Encounter Prescriptions as of 05/01/2012  Medication Sig Dispense Refill  . aspirin 81 MG EC tablet Take 81 mg by mouth daily.        . Calcium Citrate-Vitamin D 200-125 MG-UNIT TABS Take by mouth daily.        . carvedilol (COREG) 12.5 MG tablet Take 1 tablet (12.5 mg total) by mouth 2 (two) times daily with a meal.  180 tablet  3  . Cholecalciferol (VITAMIN D3) 1000 UNITS tablet Take 1,000 Units by mouth daily.        . Coenzyme Q10 200 MG capsule Take 200 mg by mouth daily.        . colchicine 0.6 MG tablet Take 0.6 mg by mouth. 1 two times a day to three times a day as needed for gout       . dextromethorphan (DELSYM) 30 MG/5ML liquid Take 10 mLs (60 mg total) by mouth as needed.  89 mL    . dextromethorphan-guaiFENesin (MUCINEX DM) 30-600 MG per 12 hr tablet Take 1 tablet by mouth as needed.      Marland Kitchen losartan (COZAAR) 50 MG tablet Take 1 tablet (50 mg total) by mouth daily.  90 tablet  3  . multivitamin  (THERAGRAN) per tablet Take 1 tablet by mouth daily. 1 tab two times a day      . Omega-3 Fatty Acids (FISH OIL) 1000 MG CPDR Take by mouth daily.        . potassium chloride (K-DUR,KLOR-CON) 10 MEQ tablet Take 1 tablet (10 mEq total) by mouth daily.  90 tablet  3  . simvastatin (ZOCOR) 20 MG tablet Take 1 tablet (20 mg total) by mouth daily.  90 tablet  3    Review of Systems  Constitutional: Negative for fever, chills, appetite change, fatigue and unexpected weight change.  HENT: Positive for hearing loss and ear pain. Negative for congestion, sore throat, trouble swallowing, neck pain, voice change, sinus pressure and ear discharge.   Eyes: Negative for visual disturbance.  Respiratory: Negative for cough, shortness of breath, wheezing and stridor.   Cardiovascular: Negative for chest pain, palpitations and leg swelling.  Gastrointestinal: Negative for nausea, vomiting, abdominal pain, diarrhea, constipation, blood in stool, abdominal distention and anal bleeding.  Genitourinary: Negative for dysuria and flank pain.  Musculoskeletal: Positive for myalgias and arthralgias. Negative for gait problem.  Skin: Negative for color change and rash.  Neurological: Negative for dizziness and headaches.  Hematological: Negative for adenopathy. Does not bruise/bleed easily.  Psychiatric/Behavioral: Negative  for suicidal ideas, disturbed wake/sleep cycle and dysphoric mood. The patient is not nervous/anxious.        Objective:   Physical Exam  Constitutional: She is oriented to person, place, and time. She appears well-developed and well-nourished. No distress.  HENT:  Head: Normocephalic and atraumatic.  Right Ear: External ear and ear canal normal. Tympanic membrane is bulging.  Left Ear: Tympanic membrane, external ear and ear canal normal.  Ears:  Nose: Nose normal.  Mouth/Throat: Oropharynx is clear and moist. No oropharyngeal exudate.  Eyes: Conjunctivae are normal. Pupils are equal,  round, and reactive to light. Right eye exhibits no discharge. Left eye exhibits no discharge. No scleral icterus.  Neck: Normal range of motion. Neck supple. No tracheal deviation present. No thyromegaly present.  Cardiovascular: Normal rate, regular rhythm, normal heart sounds and intact distal pulses.  Exam reveals no gallop and no friction rub.   No murmur heard. Pulmonary/Chest: Effort normal and breath sounds normal. No respiratory distress. She has no wheezes. She has no rales. She exhibits no tenderness.  Musculoskeletal: She exhibits no edema and no tenderness.       Right shoulder: She exhibits decreased range of motion and pain.  Lymphadenopathy:    She has no cervical adenopathy.  Neurological: She is alert and oriented to person, place, and time. No cranial nerve deficit. She exhibits normal muscle tone. Coordination normal.  Skin: Skin is warm and dry. No rash noted. She is not diaphoretic. No erythema. No pallor.  Psychiatric: She has a normal mood and affect. Her behavior is normal. Judgment and thought content normal.          Assessment & Plan:

## 2012-05-01 NOTE — Assessment & Plan Note (Signed)
Patient with right ear pain and decreased hearing on the right. This is been ongoing for several months. On exam, it appears that her tympanic membrane is bulging considerably, however it is difficult to tell whether this is the actual tympanic membrane or film overlying the tympanic membrane. Will set up ENT evaluation.

## 2012-05-01 NOTE — Assessment & Plan Note (Signed)
Right shoulder pain with bone spur noted on recent x-ray. Patient was seen by orthopedics and had steroid injection with no improvement. Encouraged her to set a followup appointment with orthopedics to consider possible arthroscopy. She reports pain is reasonably well-controlled without medication at this point. She will call if symptoms worsen.

## 2012-06-05 ENCOUNTER — Ambulatory Visit: Payer: BC Managed Care – PPO | Admitting: Internal Medicine

## 2012-07-07 ENCOUNTER — Encounter: Payer: Self-pay | Admitting: Internal Medicine

## 2012-07-07 ENCOUNTER — Ambulatory Visit (INDEPENDENT_AMBULATORY_CARE_PROVIDER_SITE_OTHER): Payer: Medicare Other | Admitting: Internal Medicine

## 2012-07-07 VITALS — BP 118/73 | HR 85 | Ht 66.0 in | Wt 206.0 lb

## 2012-07-07 DIAGNOSIS — R5381 Other malaise: Secondary | ICD-10-CM

## 2012-07-07 DIAGNOSIS — R5383 Other fatigue: Secondary | ICD-10-CM

## 2012-07-07 DIAGNOSIS — R0602 Shortness of breath: Secondary | ICD-10-CM

## 2012-07-07 LAB — CBC WITH DIFFERENTIAL/PLATELET
Basophils Relative: 0.1 % (ref 0.0–3.0)
Eosinophils Relative: 1.8 % (ref 0.0–5.0)
HCT: 41.2 % (ref 36.0–46.0)
Hemoglobin: 14.1 g/dL (ref 12.0–15.0)
Lymphs Abs: 2.4 10*3/uL (ref 0.7–4.0)
MCV: 101.8 fl — ABNORMAL HIGH (ref 78.0–100.0)
Monocytes Absolute: 0.6 10*3/uL (ref 0.1–1.0)
Neutro Abs: 5.9 10*3/uL (ref 1.4–7.7)
Neutrophils Relative %: 65.5 % (ref 43.0–77.0)
RBC: 4.05 Mil/uL (ref 3.87–5.11)
WBC: 8.9 10*3/uL (ref 4.5–10.5)

## 2012-07-07 LAB — BASIC METABOLIC PANEL
BUN: 12 mg/dL (ref 6–23)
Creatinine, Ser: 0.5 mg/dL (ref 0.4–1.2)
GFR: 122.89 mL/min (ref >60.00)
Glucose, Bld: 105 mg/dL — ABNORMAL HIGH (ref 70–99)
Potassium: 4.5 mEq/L (ref 3.5–5.1)

## 2012-07-07 NOTE — Patient Instructions (Addendum)
Lab work today We will call you with results.  Your physician has requested that you have an echocardiogram. Echocardiography is a painless test that uses sound waves to create images of your heart. It provides your doctor with information about the size and shape of your heart and how well your heart's chambers and valves are working. This procedure takes approximately one hour. There are no restrictions for this procedure.   

## 2012-07-07 NOTE — Progress Notes (Signed)
HPI Patient is a 66 year old with a history of NICM.  Cath in 2003 with normal coronary arteries.  LVEF 25%   Repeat echo in 9/12 showed LVEF was normal. Last lipid panel LDL was , HDL was 65, Trigs 305.   Since seen has noticed increased SOB and fatigue.   Allergies  Allergen Reactions  . Amoxicillin     REACTION: RASH  . Sulfonamide Derivatives     REACTION: RASH    Current Outpatient Prescriptions  Medication Sig Dispense Refill  . aspirin 81 MG EC tablet Take 81 mg by mouth daily.        . Calcium Citrate-Vitamin D 200-125 MG-UNIT TABS Take by mouth daily.        . carvedilol (COREG) 12.5 MG tablet Take 1 tablet (12.5 mg total) by mouth 2 (two) times daily with a meal.  180 tablet  3  . Cholecalciferol (VITAMIN D3) 1000 UNITS tablet Take 1,000 Units by mouth daily.        . Coenzyme Q10 200 MG capsule Take 200 mg by mouth daily.        . colchicine 0.6 MG tablet Take 0.6 mg by mouth. 1 two times a day to three times a day as needed for gout       . dextromethorphan (DELSYM) 30 MG/5ML liquid Take 10 mLs (60 mg total) by mouth as needed.  89 mL    . dextromethorphan-guaiFENesin (MUCINEX DM) 30-600 MG per 12 hr tablet Take 1 tablet by mouth as needed.      Marland Kitchen losartan (COZAAR) 50 MG tablet Take 1 tablet (50 mg total) by mouth daily.  90 tablet  3  . multivitamin (THERAGRAN) per tablet Take 1 tablet by mouth daily. 1 tab two times a day      . Omega-3 Fatty Acids (FISH OIL) 1000 MG CPDR Take by mouth daily.        . potassium chloride (K-DUR,KLOR-CON) 10 MEQ tablet Take 1 tablet (10 mEq total) by mouth daily.  90 tablet  3  . simvastatin (ZOCOR) 20 MG tablet Take 1 tablet (20 mg total) by mouth daily.  90 tablet  3    Past Medical History  Diagnosis Date  . Hyperlipidemia   . Gout   . NICM (nonischemic cardiomyopathy) 2002    EF 25%; improved to normal - echo 4/08: EF 50-55%, mild MR, mild LAE, mild TR;    cath 3/03: normal cors, EF 40%  . Glucose intolerance (impaired glucose  tolerance)   . History of chicken pox   . Allergic rhinitis     never tested, fall and spring    Past Surgical History  Procedure Date  . Nsvd     x 2  . Cystectomy 1996    l breast  . Choleystectomy 02/2002  . Vaginal delivery     x2  . Tubal ligation 1975  . Dilation and curettage of uterus 2011    Dr. Marice Potter at [facility]  . Induced abortion   . Breast biopsy 80's    Cyst removed    Family History  Problem Relation Age of Onset  . Lymphoma Mother   . Cancer Mother 10    Lymphoma  . COPD Father   . Stroke Father   . Pneumonia Father   . Diabetes Father   . Hyperlipidemia Father   . Heart disease Father   . Diabetes Sister   . Depression Sister   . Meniere's disease Brother   .  Diabetes Paternal Grandfather   . Heart disease Paternal Grandfather   . Schizophrenia Daughter   . Cancer Maternal Grandmother     Bladder  . Cancer Maternal Grandfather     leukemia  . Colon cancer Neg Hx   . Stomach cancer Neg Hx     History   Social History  . Marital Status: Married    Spouse Name: N/A    Number of Children: 2  . Years of Education: N/A   Occupational History  . Retired Runner, broadcasting/film/video   . Works at [employer] History Main Topics  . Smoking status: Never Smoker   . Smokeless tobacco: Never Used  . Alcohol Use: No  . Drug Use: No  . Sexually Active: No   Other Topics Concern  . Not on file   Social History Narrative   Lives in [place] with husband. Worked at Runner, broadcasting/film/video at [employer]. 2 children. No pets.    Review of Systems:  All systems reviewed.  They are negative to the above problem except as previously stated.  Vital Signs: BP 118/73  Pulse 85  Ht 5\' 6"  (1.676 m)  Wt 206 lb (93.441 kg)  BMI 33.25 kg/m2  Physical Exam Patient is in NAD  HEENT:  Normocephalic, atraumatic. EOMI, PERRLA.  Neck: JVP is normal.  No bruits.  Lungs: clear to auscultation. No rales no wheezes.  Heart: Regular rate and rhythm. Normal S1, S2.  No S3.   No significant murmurs. PMI not displaced.  Abdomen:  Supple, nontender. Normal bowel sounds. No masses. No hepatomegaly.  Extremities:   Good distal pulses throughout. No lower extremity edema.  Musculoskeletal :moving all extremities.  Neuro:   alert and oriented x3.  CN II-XII grossly intact.  EKG  SR  85 bpm.  Occasional PVC Assessment and Plan:  1.  SOB/fatigue  Volume status does not look  Too bad. Check labs.  May need to repeat echo to reassess LVEF.  2.  Hx NICM  As noted above.

## 2012-07-10 ENCOUNTER — Ambulatory Visit (HOSPITAL_COMMUNITY): Payer: Medicare Other | Attending: Internal Medicine

## 2012-07-10 DIAGNOSIS — I2589 Other forms of chronic ischemic heart disease: Secondary | ICD-10-CM | POA: Insufficient documentation

## 2012-07-10 DIAGNOSIS — R0602 Shortness of breath: Secondary | ICD-10-CM

## 2012-07-10 DIAGNOSIS — R0609 Other forms of dyspnea: Secondary | ICD-10-CM | POA: Insufficient documentation

## 2012-07-10 DIAGNOSIS — R0989 Other specified symptoms and signs involving the circulatory and respiratory systems: Secondary | ICD-10-CM | POA: Insufficient documentation

## 2012-07-10 DIAGNOSIS — I059 Rheumatic mitral valve disease, unspecified: Secondary | ICD-10-CM | POA: Insufficient documentation

## 2012-07-10 NOTE — Progress Notes (Signed)
Echocardiogram performed.  

## 2012-07-16 ENCOUNTER — Other Ambulatory Visit: Payer: Self-pay | Admitting: *Deleted

## 2012-07-16 DIAGNOSIS — R0602 Shortness of breath: Secondary | ICD-10-CM

## 2012-07-23 ENCOUNTER — Ambulatory Visit (HOSPITAL_COMMUNITY): Payer: Medicare Other | Attending: Cardiovascular Disease | Admitting: Radiology

## 2012-07-23 VITALS — BP 110/64 | HR 73 | Ht 66.0 in | Wt 206.0 lb

## 2012-07-23 DIAGNOSIS — I10 Essential (primary) hypertension: Secondary | ICD-10-CM | POA: Insufficient documentation

## 2012-07-23 DIAGNOSIS — I4949 Other premature depolarization: Secondary | ICD-10-CM

## 2012-07-23 DIAGNOSIS — R0609 Other forms of dyspnea: Secondary | ICD-10-CM | POA: Insufficient documentation

## 2012-07-23 DIAGNOSIS — Z8249 Family history of ischemic heart disease and other diseases of the circulatory system: Secondary | ICD-10-CM | POA: Insufficient documentation

## 2012-07-23 DIAGNOSIS — R0989 Other specified symptoms and signs involving the circulatory and respiratory systems: Secondary | ICD-10-CM | POA: Insufficient documentation

## 2012-07-23 DIAGNOSIS — R5381 Other malaise: Secondary | ICD-10-CM | POA: Insufficient documentation

## 2012-07-23 DIAGNOSIS — R0602 Shortness of breath: Secondary | ICD-10-CM

## 2012-07-23 DIAGNOSIS — R5383 Other fatigue: Secondary | ICD-10-CM | POA: Insufficient documentation

## 2012-07-23 DIAGNOSIS — E669 Obesity, unspecified: Secondary | ICD-10-CM | POA: Insufficient documentation

## 2012-07-23 MED ORDER — TECHNETIUM TC 99M SESTAMIBI GENERIC - CARDIOLITE
9.4000 | Freq: Once | INTRAVENOUS | Status: AC | PRN
  Administered 2012-07-23: 9 via INTRAVENOUS

## 2012-07-23 MED ORDER — TECHNETIUM TC 99M SESTAMIBI GENERIC - CARDIOLITE
33.0000 | Freq: Once | INTRAVENOUS | Status: AC | PRN
  Administered 2012-07-23: 33 via INTRAVENOUS

## 2012-07-23 NOTE — Progress Notes (Signed)
[facility] SITE 3 NUCLEAR MED [address] [id number] [place] [address] [phone]  Cardiology Nuclear Med Study  Betty [name] is a 66 y.o. female     MRN : [id number]     DOB: [birth year]  Procedure Date: 07/23/2012  Nuclear Med Background Indication for Stress Test:  Evaluation for Ischemia History:  '03 Cath:Normal coronaries, EF=25%; 07/10/12 Echo:EF=55% Cardiac Risk Factors: Family History - CAD, Hypertension, Lipids and Obesity  Symptoms:  DOE and Fatigue   Nuclear Pre-Procedure Caffeine/Decaff Intake:  None NPO After: 7:30am   Lungs:  Clear. O2 Sat: 90-98% with deep breath on Room Air IV 0.9% NS with Angio Cath:  22g  IV Site: R Antecubital  IV Started by:  Milana Na, EMT-P  Chest Size (in):  38 Cup Size: D  Height: 5\' 6"  (1.676 m)  Weight:  206 lb (93.441 kg)  BMI:  Body mass index is 33.25 kg/(m^2). Tech Comments:  Coreg held x 24 hours    Nuclear Med Study 1 or 2 day study: 1 day  Stress Test Type:  Stress  Reading MD: Kristeen Miss, MD  Order Authorizing Provider:  Dietrich Pates, MD  Resting Radionuclide: Technetium 2m Sestamibi  Resting Radionuclide Dose: 9.4 mCi   Stress Radionuclide:  Technetium 26m Sestamibi  Stress Radionuclide Dose: 33.0 mCi           Stress Protocol Rest HR: 73 Stress HR: 160  Rest BP: 110/64 Stress BP: 200/89  Exercise Time (min): 3:31 METS: 4.6   Predicted Max HR: 155 bpm % Max HR: 103.23 bpm Rate Pressure Product: 10272   Dose of Adenosine (mg):  n/a Dose of Lexiscan: n/a mg  Dose of Atropine (mg): n/a Dose of Dobutamine: n/a mcg/kg/min (at max HR)  Stress Test Technologist: Smiley Houseman, CMA-N  Nuclear Technologist:  Doyne Keel, CNMT     Rest Procedure:  Myocardial perfusion imaging was performed at rest 45 minutes following the intravenous administration of Technetium 72m Sestamibi.  Rest ECG: Sinus arrhythmia, otherwise WNL.  Stress Procedure:  The patient exercised on the  treadmill utilizing the Bruce protocol for 3:31 minutes. She then stopped due to fatigue and dyspnea with O2 SATS between 87 and 89%.  She denied any chest pain.  There were no diagnostic ST-T wave changes, occasional PVC's/PAC's were noted.  She had a mild hypertensive response at peak exercise, 200-89.  Technetium 91m Sestamibi was injected at peak exercise and myocardial perfusion imaging was performed after a brief delay. Stress ECG: No significant change from baseline ECG  QPS Raw Data Images:  There is a breast shadow that accounts for the anterior attenuation. Stress Images:  There is mild attenuation of the mid/distal anterior wall with normal uptake in the other regions.     Rest Images:  There is mild attenuation of the mid/distal anterior wall with normal uptake in the other regions.  Subtraction (SDS):  No evidence of ischemia. Transient Ischemic Dilatation (Normal <1.22):  0.98 Lung/Heart Ratio (Normal <0.45):  0.39  Quantitative Gated Spect Images QGS EDV:  71 ml QGS ESV:  32 ml  Impression Exercise Capacity:  Poor exercise capacity. BP Response:  Normal blood pressure response. Clinical Symptoms:  Mild  dyspnea. ECG Impression:  No significant ST segment change suggestive of ischemia. Comparison with Prior Nuclear Study: No previous nuclear study performed  Overall Impression:  Low risk stress nuclear study.  There is mild attenuation of the mid and distal anterior wall that appears to be  consistent with breast artifact.  The LV function is normal and there are no segmental wall motion abnormalities.  The distal anterior wall contracts well.  LV Ejection Fraction: 55%.  LV Wall Motion:  NL LV Function; NL Wall Motion.    Vesta Mixer, Montez Hageman., MD, [facility] 07/23/2012, 5:20 PM Office - [phone] Pager [phone]

## 2012-08-07 ENCOUNTER — Telehealth: Payer: Self-pay | Admitting: Internal Medicine

## 2012-08-07 DIAGNOSIS — I10 Essential (primary) hypertension: Secondary | ICD-10-CM

## 2012-08-07 NOTE — Telephone Encounter (Signed)
Pt is rtning Betty [name]'s call not sure what call was regarding

## 2012-08-07 NOTE — Telephone Encounter (Signed)
**Note De-Identified Elroy Schembri Obfuscation** Pt. states she is returning Jackie's call from yesterday. Will forward message to [facility].

## 2012-08-07 NOTE — Telephone Encounter (Signed)
Pt rtn your call

## 2012-08-07 NOTE — Telephone Encounter (Signed)
**Note De-identified Jamaurion Slemmer Obfuscation** LMTCB

## 2012-08-08 MED ORDER — LOSARTAN POTASSIUM 50 MG PO TABS: ORAL_TABLET | ORAL | Status: DC

## 2012-08-08 MED ORDER — CARVEDILOL 25 MG PO TABS: 25.0000 mg | ORAL_TABLET | Freq: Two times a day (BID) | ORAL | Status: DC

## 2012-08-08 NOTE — Telephone Encounter (Signed)
Patient instructed to increase Coreg to 25mg  2 times per day and decrease Cozaar to 25mg  every day per Dr.Ross. She will call back in a few weeks to let us know how she feels.

## 2012-08-18 ENCOUNTER — Ambulatory Visit: Payer: Self-pay | Admitting: Internal Medicine

## 2012-08-18 ENCOUNTER — Encounter: Payer: Self-pay | Admitting: Internal Medicine

## 2012-08-18 ENCOUNTER — Ambulatory Visit (INDEPENDENT_AMBULATORY_CARE_PROVIDER_SITE_OTHER): Payer: Medicare Other | Admitting: Internal Medicine

## 2012-08-18 VITALS — BP 118/62 | HR 69 | Temp 98.1°F | Resp 16 | Wt 209.5 lb

## 2012-08-18 DIAGNOSIS — R5383 Other fatigue: Secondary | ICD-10-CM | POA: Insufficient documentation

## 2012-08-18 DIAGNOSIS — Z1331 Encounter for screening for depression: Secondary | ICD-10-CM

## 2012-08-18 DIAGNOSIS — D510 Vitamin B12 deficiency anemia due to intrinsic factor deficiency: Secondary | ICD-10-CM

## 2012-08-18 DIAGNOSIS — R0600 Dyspnea, unspecified: Secondary | ICD-10-CM | POA: Insufficient documentation

## 2012-08-18 DIAGNOSIS — R0989 Other specified symptoms and signs involving the circulatory and respiratory systems: Secondary | ICD-10-CM

## 2012-08-18 DIAGNOSIS — R0609 Other forms of dyspnea: Secondary | ICD-10-CM | POA: Insufficient documentation

## 2012-08-18 DIAGNOSIS — R5381 Other malaise: Secondary | ICD-10-CM

## 2012-08-18 LAB — COMPREHENSIVE METABOLIC PANEL
ALT: 27 U/L (ref 0–35)
AST: 23 U/L (ref 0–37)
Albumin: 3.6 g/dL (ref 3.5–5.2)
CO2: 33 mEq/L — ABNORMAL HIGH (ref 19–32)
Calcium: 10 mg/dL (ref 8.4–10.5)
Chloride: 98 mEq/L (ref 96–112)
GFR: 104.45 mL/min (ref >60.00)
Potassium: 4.2 mEq/L (ref 3.5–5.1)
Sodium: 138 mEq/L (ref 135–145)
Total Protein: 7.3 g/dL (ref 6.0–8.3)

## 2012-08-18 LAB — CBC WITH DIFFERENTIAL/PLATELET
Basophils Absolute: 0.1 10*3/uL (ref 0.0–0.1)
Eosinophils Relative: 1.8 % (ref 0.0–5.0)
HCT: 41.7 % (ref 36.0–46.0)
Lymphocytes Relative: 30.5 % (ref 12.0–46.0)
Monocytes Relative: 8 % (ref 3.0–12.0)
Platelets: 208 10*3/uL (ref 150.0–400.0)
RDW: 12.4 % (ref 11.5–14.6)
WBC: 8.8 10*3/uL (ref 4.5–10.5)

## 2012-08-18 LAB — VITAMIN B12: Vitamin B-12: 487 pg/mL (ref 211–911)

## 2012-08-18 LAB — D-DIMER, QUANTITATIVE: D-Dimer, Quant: 0.34 ug/mL-FEU (ref 0.00–0.48)

## 2012-08-18 NOTE — Progress Notes (Signed)
Subjective:    Patient ID: Betty [name], female    DOB: 07-29-1946, 66 y.o.   MRN: [id number]  HPI 66 year old female with history of cardiomyopathy, hypertension, hyperlipidemia presents for followup. Her primary concern today is a 4 week history of dyspnea. She reports that about 4 weeks ago she developed fairly sudden onset of dyspnea. Dyspnea is only with exertion. She has difficulty walking more than 10 feet or so without significant shortness of breath. She denies shortness of breath at rest. She denies chest pain, wheezing, cough, fever, chills. She was seen by her cardiologist and underwent echo which showed normal LV function. She also had Myoview stress test which was normal. She has no history of asthma, COPD, smoking. However, her father was a smoker. She has also noticed some increased swelling in her left greater than right lower legs. This is been present for the last several weeks as well. She also reports some generalized fatigue. She reports that she feels exhausted even after a good nights sleep.  Outpatient Encounter Prescriptions as of 08/18/2012  Medication Sig Dispense Refill  . aspirin 81 MG EC tablet Take 81 mg by mouth daily.        . Calcium Citrate-Vitamin D 200-125 MG-UNIT TABS Take by mouth daily.        . carvedilol (COREG) 25 MG tablet Take 1 tablet (25 mg total) by mouth 2 (two) times daily.  180 tablet  3  . Cholecalciferol (VITAMIN D3) 1000 UNITS tablet Take 1,000 Units by mouth daily.        . Coenzyme Q10 200 MG capsule Take 200 mg by mouth daily.        . colchicine 0.6 MG tablet Take 0.6 mg by mouth. 1 two times a day to three times a day as needed for gout       . dextromethorphan (DELSYM) 30 MG/5ML liquid Take 10 mLs (60 mg total) by mouth as needed.  89 mL    . dextromethorphan-guaiFENesin (MUCINEX DM) 30-600 MG per 12 hr tablet Take 1 tablet by mouth as needed.      Marland Kitchen losartan (COZAAR) 50 MG tablet One half tab every day.  90 tablet  3  . multivitamin  (THERAGRAN) per tablet Take 1 tablet by mouth daily. 1 tab two times a day      . Omega-3 Fatty Acids (FISH OIL) 1000 MG CPDR Take by mouth daily.        . potassium chloride (K-DUR,KLOR-CON) 10 MEQ tablet Take 1 tablet (10 mEq total) by mouth daily.  90 tablet  3  . simvastatin (ZOCOR) 20 MG tablet Take 1 tablet (20 mg total) by mouth daily.  90 tablet  3   BP 118/62  Pulse 69  Temp 98.1 F (36.7 C) (Oral)  Resp 16  Wt 209 lb 8 oz (95.029 kg)  SpO2 90%  Review of Systems  Constitutional: Positive for fatigue. Negative for fever, chills, appetite change and unexpected weight change.  HENT: Negative for ear pain, congestion, sore throat, trouble swallowing, neck pain, voice change and sinus pressure.   Eyes: Negative for visual disturbance.  Respiratory: Positive for shortness of breath. Negative for cough, wheezing and stridor.   Cardiovascular: Negative for chest pain, palpitations and leg swelling.  Gastrointestinal: Negative for nausea, vomiting, abdominal pain, diarrhea, constipation, blood in stool, abdominal distention and anal bleeding.  Genitourinary: Negative for dysuria and flank pain.  Musculoskeletal: Negative for myalgias, arthralgias and gait problem.  Skin: Negative for color  change and rash.  Neurological: Negative for dizziness and headaches.  Hematological: Negative for adenopathy. Does not bruise/bleed easily.  Psychiatric/Behavioral: Negative for suicidal ideas, sleep disturbance and dysphoric mood. The patient is not nervous/anxious.        Objective:   Physical Exam  Constitutional: She is oriented to person, place, and time. She appears well-developed and well-nourished. No distress.  HENT:  Head: Normocephalic and atraumatic.  Right Ear: External ear normal.  Left Ear: External ear normal.  Nose: Nose normal.  Mouth/Throat: Oropharynx is clear and moist. No oropharyngeal exudate.  Eyes: Conjunctivae normal are normal. Pupils are equal, round, and reactive  to light. Right eye exhibits no discharge. Left eye exhibits no discharge. No scleral icterus.  Neck: Normal range of motion. Neck supple. No tracheal deviation present. No thyromegaly present.  Cardiovascular: Normal rate, regular rhythm, normal heart sounds and intact distal pulses.  Exam reveals no gallop and no friction rub.   No murmur heard. Pulmonary/Chest: Effort normal and breath sounds normal. No accessory muscle usage. Not tachypneic. No respiratory distress. She has no decreased breath sounds. She has no wheezes. She has no rhonchi. She has no rales. She exhibits no tenderness.  Musculoskeletal: Normal range of motion. She exhibits no edema and no tenderness.  Lymphadenopathy:    She has no cervical adenopathy.  Neurological: She is alert and oriented to person, place, and time. No cranial nerve deficit. She exhibits normal muscle tone. Coordination normal.  Skin: Skin is warm and dry. No rash noted. She is not diaphoretic. No erythema. No pallor.  Psychiatric: She has a normal mood and affect. Her behavior is normal. Judgment and thought content normal.          Assessment & Plan:

## 2012-08-18 NOTE — Assessment & Plan Note (Signed)
Sudden onset dyspnea 4 weeks ago. Cardiac workup including ECHO and Myoview normal. Non-smoker. No h/o lung disease. Exam remarkable for crackles left lower and middle lung field, hypoxia 87-90% on room air, increased swelling LLE.  Will get CT Chest with contrast to evaluate for PE. Will call pt with results. Will set up pulmonary evaluation and plan to set up sleep study if CT today normal.

## 2012-08-18 NOTE — Assessment & Plan Note (Signed)
Symptoms of fatigue are likely related to hypoxia. However macrocytosis noted on recent CBC. Will check repeat CBC and B12. We'll also check CMP. Recent TSH was normal. As above, if CT chest normal today, will proceed with sleep study.

## 2012-09-04 ENCOUNTER — Encounter: Payer: Self-pay | Admitting: Internal Medicine

## 2012-09-08 ENCOUNTER — Encounter: Payer: Self-pay | Admitting: Pulmonary Disease

## 2012-09-08 ENCOUNTER — Ambulatory Visit (INDEPENDENT_AMBULATORY_CARE_PROVIDER_SITE_OTHER): Payer: 59 | Admitting: Pulmonary Disease

## 2012-09-08 VITALS — BP 126/74 | HR 68 | Temp 98.1°F | Ht 65.0 in | Wt 210.1 lb

## 2012-09-08 DIAGNOSIS — R0609 Other forms of dyspnea: Secondary | ICD-10-CM

## 2012-09-08 DIAGNOSIS — R0600 Dyspnea, unspecified: Secondary | ICD-10-CM

## 2012-09-08 DIAGNOSIS — J8410 Pulmonary fibrosis, unspecified: Secondary | ICD-10-CM

## 2012-09-08 DIAGNOSIS — G4710 Hypersomnia, unspecified: Secondary | ICD-10-CM

## 2012-09-08 DIAGNOSIS — J301 Allergic rhinitis due to pollen: Secondary | ICD-10-CM

## 2012-09-08 DIAGNOSIS — R400 Somnolence: Secondary | ICD-10-CM

## 2012-09-08 DIAGNOSIS — R5381 Other malaise: Secondary | ICD-10-CM

## 2012-09-08 DIAGNOSIS — R5383 Other fatigue: Secondary | ICD-10-CM

## 2012-09-08 DIAGNOSIS — R0989 Other specified symptoms and signs involving the circulatory and respiratory systems: Secondary | ICD-10-CM

## 2012-09-08 NOTE — Patient Instructions (Signed)
For your sinus drainage: Use Neil Med rinses with distilled water at least twice per day using the instructions on the package. 1/2 hour after using the [facility] Med rinse, use Nasonex two puffs in each nostril once per day. Use chlortrimeton and an over the counter decongestant (pseudophed or phenylephrine) as needed for the cough.  For your breathing: We will send you for breathing tests and a blood test  We will send you for a sleep study  We will see you back in two to three weeks after all the testing is complete

## 2012-09-08 NOTE — Assessment & Plan Note (Signed)
Betty [name] describes daytime somnolence and takes 3-4 naps a day. She snores regularly. Objectively on physical exam she is a very characteristic neck size and oropharynx shape which can cause obstructive sleep apnea.  Plan: -Sleep study in [place].

## 2012-09-08 NOTE — Assessment & Plan Note (Signed)
[name] shortness of breath has been persistent for years but seems to be increasing in the last several months. Objectively the most notable abnormalities are groundglass opacities bilaterally, and elevated serum bicarbonate, and a physical exam consistent with someone with obstructive sleep apnea. We walked her here in the office today and her oxygen level did not drop below 88% with exertion.  I explained to her that I am concerned that she may have some degree of pulmonary fibrosis. At this point I would have his imaging ago by him a differential diagnosis would include NSIP versus less likely a bronchiolitis syndrome or hypersensitivity pneumonitis. The radiologist at [facility] was concerned about hypersensitivity pneumonitis, but I fail to see the preponderance of centrilobular micronodules and upper lobe distribution which is characteristic of this disease. Further she does not have the characteristic "location dependent" shortness of breath that these folks often have. In that typically this disease is characterized by shortness of breath when exposed to something in particular such as mold, mildew, organic dust. She does not have anything in her history that suggest this.  She does not have symptoms characteristic of a connective tissue disease.  The elevated serum bicarbonate makes me concerned for chronic hypercapnic respiratory failure. She does not appear to have neuromuscular weakness on physical exam that would explain this. We need to obtain more objective data.  I do believe she has obstructive sleep apnea, see below. This is certainly contributing to fatigue.  Plan: -Serologic workup to look for evidence of connective tissue disease which could be associated with nonspecific interstitial pneumonitis -Full pulmonary function testing at [facility] at [facility]. -Return to clinic after these are performed

## 2012-09-08 NOTE — Assessment & Plan Note (Signed)
I think this is contributing to postnasal drip and phlegm production.  Plan: -saline rinses -Topical steroid -Over-the-counter decongestants.
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
## 2012-09-11 ENCOUNTER — Ambulatory Visit: Payer: Self-pay | Admitting: Pulmonary Disease

## 2012-09-11 LAB — PULMONARY FUNCTION TEST

## 2012-09-14 ENCOUNTER — Encounter: Payer: Self-pay | Admitting: Internal Medicine

## 2012-09-16 ENCOUNTER — Encounter: Payer: Self-pay | Admitting: Pulmonary Disease

## 2012-09-16 ENCOUNTER — Other Ambulatory Visit (INDEPENDENT_AMBULATORY_CARE_PROVIDER_SITE_OTHER): Payer: 59

## 2012-09-16 ENCOUNTER — Telehealth: Payer: Self-pay | Admitting: *Deleted

## 2012-09-16 DIAGNOSIS — J8410 Pulmonary fibrosis, unspecified: Secondary | ICD-10-CM

## 2012-09-16 NOTE — Telephone Encounter (Signed)
Spoke with pt and notified of recs per Dr. Kendrick Fries She verbalized understanding Will come for labs today- hypersensitivity pneumonitis panel added MIP/MEP order sent to [facility] Per Dr. Kendrick Fries, okay to wait until 10/21/12 for ov to ensure all results are back including her upcoming PSG

## 2012-09-16 NOTE — Telephone Encounter (Signed)
Message copied by Christen Butter on Tue Sep 16, 2012  9:50 AM ------      Message from: Max Fickle B      Created: Tue Sep 16, 2012  9:07 AM       L,            Can you let her know that I saw her tests from [facility] and again I'm concerned about pulmonary fibrosis (I discussed this with her last time).  Also, can you find out if she had her lab work last week?  I don't see any results.  It was the usual ILD panel of labs.            If she needs to have it collected again then let's add a hypersensitivity pneumonitis panel.  I'd also like to have her get a MIP and MEP prior to the next visit as well.              If there is anyway I could see her sooner than 2/25 that would be nice too.            Thanks      B

## 2012-09-22 LAB — HYPERSENSITIVITY PNUEMONITIS PROFILE

## 2012-10-01 ENCOUNTER — Ambulatory Visit (HOSPITAL_BASED_OUTPATIENT_CLINIC_OR_DEPARTMENT_OTHER): Payer: Medicare Other | Attending: Pulmonary Disease | Admitting: Radiology

## 2012-10-01 VITALS — Ht 65.0 in | Wt 210.0 lb

## 2012-10-01 DIAGNOSIS — R400 Somnolence: Secondary | ICD-10-CM

## 2012-10-01 DIAGNOSIS — G4733 Obstructive sleep apnea (adult) (pediatric): Secondary | ICD-10-CM

## 2012-10-01 DIAGNOSIS — G4730 Sleep apnea, unspecified: Secondary | ICD-10-CM | POA: Insufficient documentation

## 2012-10-01 DIAGNOSIS — G4710 Hypersomnia, unspecified: Secondary | ICD-10-CM | POA: Insufficient documentation

## 2012-10-02 ENCOUNTER — Ambulatory Visit: Payer: Self-pay | Admitting: Pulmonary Disease

## 2012-10-02 LAB — PULMONARY FUNCTION TEST

## 2012-10-06 ENCOUNTER — Telehealth: Payer: Self-pay | Admitting: Internal Medicine

## 2012-10-06 DIAGNOSIS — Z1239 Encounter for other screening for malignant neoplasm of breast: Secondary | ICD-10-CM

## 2012-10-06 NOTE — Telephone Encounter (Signed)
Order placed

## 2012-10-06 NOTE — Telephone Encounter (Signed)
Pt called stating its time for her mammogram. She needs order

## 2012-10-06 NOTE — Telephone Encounter (Signed)
Requesting order for mammogram to be done.

## 2012-10-09 DIAGNOSIS — G4730 Sleep apnea, unspecified: Secondary | ICD-10-CM

## 2012-10-09 DIAGNOSIS — G4710 Hypersomnia, unspecified: Secondary | ICD-10-CM

## 2012-10-09 NOTE — Procedures (Signed)
[name], [name]              ACCOUNT NO.:  [id number]  MEDICAL RECORD NO.:  [id number]          PATIENT TYPE:  OUT  LOCATION:  SLEEP CENTER                 FACILITY:  [facility]  PHYSICIAN:  Barbaraann Share, MD,FCCPDATE OF BIRTH:  [birth year] 05, 1946  DATE OF STUDY:  10/01/2012                           NOCTURNAL POLYSOMNOGRAM  REFERRING PHYSICIAN:  DOUGLAS Heber Tickfaw, MD  LOCATION:  Sleep Lab.  INDICATION FOR STUDY:  Hypersomnia with sleep apnea.  EPWORTH SLEEPINESS SCORE:  10.  SLEEP ARCHITECTURE:  The patient had a total sleep time of 142 minutes with no slow-wave sleep and only 2 minutes of REM.  Sleep onset latency was mildly prolonged at 49 minutes and REM onset was very prolonged at 238 minutes.  Sleep efficiency was poor at 38%.  RESPIRATORY DATA:  The patient was found to have 4 apneas and 3 obstructive hypopneas, giving her an apnea-hypopnea index of only 3 events per hour.  The events occurred in all body positions, and there was moderate snoring noted throughout.  Even though the patient had very little REM during the night, she had more than ample opportunity to exhibit clinically significant obstructive sleep apnea.  OXYGEN DATA:  The patient had O2 desaturation as low as 82% during the night, most of which was not associated with obstructive events.  She was therefore started on oxygen just after midnight, and increased to 1.5 L/minute.  CARDIAC DATA:  Rare PVC noted, but no clinically significant arrhythmias were seen.  MOVEMENT-PARASOMNIA:  The patient had no significant leg jerks or other abnormal behavior seen.  IMPRESSIONS-RECOMMENDATIONS: 1. Small numbers of obstructive events, which do not meet the AHI     criteria for the obstructive sleep apnea syndrome. 2. Oxygen desaturation as low as 82% independent of the patient's     obstructive events. By sleep center protocol,she was then started on oxygen     at 1 L/minute just after midnight,and increased to  1.5 L/minute with     adequate saturations thereafter.     Barbaraann Share, MD,FCCP Diplomate, American Board of Sleep Medicine    [facility]  D:  10/09/2012 12:56:43  T:  10/09/2012 22:50:40  Job:  [id number]

## 2012-10-13 ENCOUNTER — Encounter: Payer: Self-pay | Admitting: Pulmonary Disease

## 2012-10-13 ENCOUNTER — Telehealth: Payer: Self-pay | Admitting: *Deleted

## 2012-10-13 MED ORDER — MOMETASONE FUROATE 50 MCG/ACT NA SUSP: 2.0000 | Freq: Every day | NASAL | Status: DC

## 2012-10-13 NOTE — Telephone Encounter (Signed)
Message copied by Caryl Ada on Mon Oct 13, 2012  3:18 PM ------      Message from: Max Fickle B      Created: Mon Oct 13, 2012  2:17 PM       L,            Please let her know that her sleep study did not show evidence of sleep apnea.              Thanks,      B ------

## 2012-10-13 NOTE — Telephone Encounter (Signed)
Pt is aware of sleep study results per BQ. She inquired about getting a prescription for Nasonex, BQ had given her a sample of this at her last appointment. This has been sent to her pharmacy.

## 2012-10-15 ENCOUNTER — Telehealth: Payer: Self-pay | Admitting: *Deleted

## 2012-10-15 ENCOUNTER — Encounter: Payer: Self-pay | Admitting: Pulmonary Disease

## 2012-10-15 DIAGNOSIS — R0600 Dyspnea, unspecified: Secondary | ICD-10-CM

## 2012-10-15 NOTE — Telephone Encounter (Signed)
Message copied by Christen Butter on Wed Oct 15, 2012  5:29 PM ------      Message from: Max Fickle B      Created: Wed Oct 15, 2012 11:56 AM       Hi,            This lady had full PFT's in January and February at [facility].  I think there may have been a mix up because I wanted her to have  MIP and a MEP prior to our next visit.            Anyway we can make sure she has that before the next visit?            Thanks,      Kipp Brood ------

## 2012-10-15 NOTE — Telephone Encounter (Signed)
Message copied by Caryl Ada on Wed Oct 15, 2012 12:16 PM ------      Message from: Max Fickle B      Created: Wed Oct 15, 2012 11:56 AM       Hi,            This lady had full PFT's in January and February at [facility].  I think there may have been a mix up because I wanted her to have  MIP and a MEP prior to our next visit.            Anyway we can make sure she has that before the next visit?            Thanks,      Kipp Brood ------

## 2012-10-15 NOTE — Telephone Encounter (Signed)
I spoke to the patient and she is aware that we will need this test done before her appointment. She was okay with this being done.  Order will be placed and routed to [facility].

## 2012-10-15 NOTE — Telephone Encounter (Signed)
LC has already spoke with pt and the test has been ordered so I will close the encounter

## 2012-10-20 ENCOUNTER — Ambulatory Visit (HOSPITAL_COMMUNITY)
Admission: RE | Admit: 2012-10-20 | Discharge: 2012-10-20 | Disposition: A | Discharge status: Home / Self Care | Payer: Medicare Other | Source: Ambulatory Visit | Attending: Pulmonary Disease | Admitting: Pulmonary Disease

## 2012-10-20 DIAGNOSIS — R062 Wheezing: Secondary | ICD-10-CM | POA: Insufficient documentation

## 2012-10-20 DIAGNOSIS — R0609 Other forms of dyspnea: Secondary | ICD-10-CM | POA: Insufficient documentation

## 2012-10-20 DIAGNOSIS — R0989 Other specified symptoms and signs involving the circulatory and respiratory systems: Secondary | ICD-10-CM | POA: Insufficient documentation

## 2012-10-20 DIAGNOSIS — R0600 Dyspnea, unspecified: Secondary | ICD-10-CM

## 2012-10-20 LAB — PULMONARY FUNCTION TEST

## 2012-10-21 ENCOUNTER — Encounter: Payer: Self-pay | Admitting: Pulmonary Disease

## 2012-10-21 ENCOUNTER — Ambulatory Visit (INDEPENDENT_AMBULATORY_CARE_PROVIDER_SITE_OTHER): Payer: 59 | Admitting: Pulmonary Disease

## 2012-10-21 VITALS — BP 108/68 | HR 84 | Temp 97.9°F | Ht 65.0 in | Wt 204.0 lb

## 2012-10-21 DIAGNOSIS — G4730 Sleep apnea, unspecified: Secondary | ICD-10-CM

## 2012-10-21 DIAGNOSIS — J986 Disorders of diaphragm: Secondary | ICD-10-CM

## 2012-10-21 DIAGNOSIS — R0989 Other specified symptoms and signs involving the circulatory and respiratory systems: Secondary | ICD-10-CM

## 2012-10-21 DIAGNOSIS — R0600 Dyspnea, unspecified: Secondary | ICD-10-CM

## 2012-10-21 DIAGNOSIS — R0609 Other forms of dyspnea: Secondary | ICD-10-CM

## 2012-10-21 NOTE — Patient Instructions (Signed)
We will refer you to a neurologist to evaluate your diaphragm weakness We will send you for for a diaphragm study at [facility] We will set up a home sleep study We will have you get blood work in our office today We will see you back in 3 weeks

## 2012-10-21 NOTE — Assessment & Plan Note (Signed)
We have not yet diagnosis with Betty [name]. Since her last visit we have confirmed that she has restrictive lung disease, a markedly abnormal diffusion capacity in the lungs, and evidence of diaphragm weakness.   I personally reviewed the images from her CT chest and feel that the findings described by radiology as groundglass abnormalities are in fact more consistent with air trapping.  I still think that it is very likely that she has obstructive sleep apnea as she tells me (and report confirms) but she really did not sleep during sleep study.  My differential diagnosis at this point includes diaphragm weakness based on the abnormal MIP/MEP test and bronchiolitis versus chronic thromboembolic disease given the heterogeneity of blood flow in the lung seen on her most recent CT scan. I do not think that hypersensitivity pneumonitis is likely.  Plan: -Serologic studies to look for evidence of a connective tissue disease will be drawn today -She may need a repeat CT chest with inspiratory and expiratory images to confirm air trapping (we will await until we have the results of the serologies back) -We will order an at home polysomnogram. -She may ultimately need a biopsy of the lung. -Followup in 3 weeks

## 2012-10-21 NOTE — Progress Notes (Signed)
Subjective:    Patient ID: Betty [name], female    DOB: 1945-12-17, 67 y.o.   MRN: [id number]  Synopsis: Betty [name] is a very pleasant female who first saw the [facility] pulmonary clinic in December 2013 for evaluation of shortness of breath. She is a lifetime nonsmoker. She developed increasing shortness of breath over the course of 2 years about 2011 to 2013. Pulmonary function testing showed restrictive lung disease with a markedly depressed DLCO at 35% predicted. CT scanning of her chest showed upper lobe groundglass abnormalities versus air trapping.  HPI  10/21/2012 routine office visit--[place] it says that she feels about the same since the last visit. Her husband is with her today. He states that there has been a clear change in her ability to exercise in the last 8 months. She can now "barely walk to the car without getting short of breath". She has not had a change in her mild dry cough. She does not have chest pain she has not had increasing leg swelling. She tells me that during the polysomnogram she did not sleep. There have been no changes to her medications since the last visit.  Past Medical History  Diagnosis Date  . Hyperlipidemia   . Gout   . NICM (nonischemic cardiomyopathy) 2002    EF 25%; improved to normal - echo 4/08: EF 50-55%, mild MR, mild LAE, mild TR;    cath 3/03: normal cors, EF 40%  . Glucose intolerance (impaired glucose tolerance)   . History of chicken pox   . Allergic rhinitis     never tested, fall and spring    Review of Systems  Constitutional: Positive for fatigue. Negative for fever and chills.  HENT: Negative for congestion, rhinorrhea and postnasal drip.   Respiratory: Positive for cough and shortness of breath. Negative for wheezing and stridor.   Cardiovascular: Positive for leg swelling. Negative for chest pain and palpitations.       Objective:   Physical Exam Filed Vitals:   10/21/12 1009  BP: 108/68  Pulse: 84   Temp: 97.9 F (36.6 C)  TempSrc: Oral  Height: 5\' 5"  (1.651 m)  Weight: 204 lb (92.534 kg)  SpO2: 90%   Gen: well appearing, no acute distress HEENT: NCAT, EOMi, OP clear,  PULM: Insp crackles upper lobes bilaterally, wheezing bilaterally CV: RRR, no mgr, no JVD AB: BS+, soft, nontender, no hsm Ext: warm, trace ankle edema, no clubbing, no cyanosis Neuro: A&Ox4, CN II-XII intact, strength 5/5 in all 4 extremities   06/2012 Echo>> grade 1 diastolic dysfunction, LVEF 55%, RV/RA normal, triscupid regurge jet velocity WNL 07/2012 CT chest >> air trapping upper lobes greater than lower bilaterally; no clear infiltrate, mass or mediastinal abnormality 08/2012 PFT [facility] >> Ratio normal, Flow volume loop normal; FVC 1.46 L (48% pred), TLC 2.77 L (55% pred) ERV 0.03 (3% pred), DLCO 8.4 (35% pred) 08/2012 ABG >> 7.39/52/57/31.5/92% 10/01/2012 PSG>> poor sleep efficiency; AHI < 5; desaturated as low as 82% 09/2012 PFT [facility] >> unchanged from 08/2012 study. 10/20/2012 MIP/MEP >> 36 (36% pred)/-20 (28% pred)      Assessment & Plan:   Dyspnea We have not yet diagnosis with Betty [name]. Since her last visit we have confirmed that she has restrictive lung disease, a markedly abnormal diffusion capacity in the lungs, and evidence of diaphragm weakness.   I personally reviewed the images from her CT chest and feel that the findings described by radiology as groundglass abnormalities are in fact more  consistent with air trapping.  I still think that it is very likely that she has obstructive sleep apnea as she tells me (and report confirms) but she really did not sleep during sleep study.  My differential diagnosis at this point includes diaphragm weakness based on the abnormal MIP/MEP test and bronchiolitis versus chronic thromboembolic disease given the heterogeneity of blood flow in the lung seen on her most recent CT scan. I do not think that hypersensitivity pneumonitis is likely.  Plan: -Serologic  studies to look for evidence of a connective tissue disease will be drawn today -She may need a repeat CT chest with inspiratory and expiratory images to confirm air trapping (we will await until we have the results of the serologies back) -We will order an at home polysomnogram. -She may ultimately need a biopsy of the lung. -Followup in 3 weeks  Diaphragm dysfunction Based on her abnormal MIP/MEP test performed in February 2014 I will refer her for a "sniff test" (diaphragm fluoroscopy) as well as to a neurologist for evaluation of possible diaphragm weakness.   Updated Medication List Outpatient Encounter Prescriptions as of 10/21/2012  Medication Sig Dispense Refill  . aspirin 81 MG EC tablet Take 81 mg by mouth daily.        . Calcium Citrate-Vitamin D 200-125 MG-UNIT TABS Take by mouth daily.        . carvedilol (COREG) 25 MG tablet Take 1 tablet (25 mg total) by mouth 2 (two) times daily.  180 tablet  3  . Cholecalciferol (VITAMIN D3) 1000 UNITS tablet Take 1,000 Units by mouth daily.        . Coenzyme Q10 200 MG capsule Take 200 mg by mouth daily.        . colchicine 0.6 MG tablet Take 0.6 mg by mouth. 1 two times a day to three times a day as needed for gout       . dextromethorphan (DELSYM) 30 MG/5ML liquid Take 10 mLs (60 mg total) by mouth as needed.  89 mL    . dextromethorphan-guaiFENesin (MUCINEX DM) 30-600 MG per 12 hr tablet Take 1 tablet by mouth as needed.      Marland Kitchen losartan (COZAAR) 50 MG tablet One half tab every day.  90 tablet  3  . mometasone (NASONEX) 50 MCG/ACT nasal spray Place 2 sprays into the nose daily.  17 g  2  . multivitamin (THERAGRAN) per tablet Take 1 tablet by mouth daily.       . Omega-3 Fatty Acids (FISH OIL) 1000 MG CPDR Take 1,200 mg by mouth daily.       . potassium chloride (K-DUR,KLOR-CON) 10 MEQ tablet Take 1 tablet (10 mEq total) by mouth daily.  90 tablet  3  . simvastatin (ZOCOR) 20 MG tablet Take 1 tablet (20 mg total) by mouth daily.  90 tablet   3   No facility-administered encounter medications on file as of 10/21/2012.

## 2012-10-21 NOTE — Assessment & Plan Note (Signed)
Based on her abnormal MIP/MEP test performed in February 2014 I will refer her for a "sniff test" (diaphragm fluoroscopy) as well as to a neurologist for evaluation of possible diaphragm weakness.

## 2012-10-22 LAB — JO-1 ANTIBODY-IGG: Jo-1 Antibody, IgG: <1 AU/mL (ref <30)

## 2012-10-22 LAB — ANA: Anti Nuclear Antibody(ANA): NEGATIVE

## 2012-10-22 LAB — RHEUMATOID FACTOR: Rhuematoid fact SerPl-aCnc: 31 IU/mL — ABNORMAL HIGH (ref <=14)

## 2012-10-22 LAB — ANTI-SCLERODERMA ANTIBODY: Scleroderma (Scl-70) (ENA) Antibody, IgG: <1 AU/mL (ref <30)

## 2012-10-23 DIAGNOSIS — G4733 Obstructive sleep apnea (adult) (pediatric): Secondary | ICD-10-CM

## 2012-10-23 NOTE — Progress Notes (Signed)
Quick Note:  Spoke with pt and notified of results per Dr. McQuaid. Pt verbalized understanding and denied any questions.  ______ 

## 2012-10-24 ENCOUNTER — Ambulatory Visit (HOSPITAL_COMMUNITY)
Admission: RE | Admit: 2012-10-24 | Discharge: 2012-10-24 | Disposition: A | Discharge status: Home / Self Care | Payer: Medicare Other | Source: Ambulatory Visit | Attending: Pulmonary Disease | Admitting: Pulmonary Disease

## 2012-10-24 DIAGNOSIS — R0602 Shortness of breath: Secondary | ICD-10-CM | POA: Insufficient documentation

## 2012-10-24 DIAGNOSIS — J986 Disorders of diaphragm: Secondary | ICD-10-CM

## 2012-10-29 ENCOUNTER — Encounter: Payer: Self-pay | Admitting: Pulmonary Disease

## 2012-10-30 NOTE — Progress Notes (Signed)
Quick Note:  Spoke with pt and notified of results per Dr. McQuaid. Pt verbalized understanding and denied any questions.  ______ 

## 2012-10-31 ENCOUNTER — Encounter: Payer: Medicare Other | Admitting: Internal Medicine

## 2012-11-03 ENCOUNTER — Encounter: Payer: Self-pay | Admitting: Pulmonary Disease

## 2012-11-04 ENCOUNTER — Telehealth: Payer: Self-pay | Admitting: *Deleted

## 2012-11-04 DIAGNOSIS — G4733 Obstructive sleep apnea (adult) (pediatric): Secondary | ICD-10-CM

## 2012-11-04 NOTE — Telephone Encounter (Signed)
Spoke with pt and notified of results/recs per Dr. Kendrick Fries. Pt verbalized understanding and denied any questions. Order was sent to [facility] for o2 start and ONO Pt to keep planned appt next wk

## 2012-11-04 NOTE — Telephone Encounter (Signed)
Message copied by Christen Butter on Tue Nov 04, 2012  9:33 AM ------      Message from: Max Fickle B      Created: Tue Nov 04, 2012  8:45 AM       L,            Please let her know that her sleep study at home showed sleep apnea and that her O2 was low.            We need to start her on 2L at night and have an ONO.  We will also need to start her on CPAP, after I see her in the office next.            Thanks,      B ------

## 2012-11-04 NOTE — Telephone Encounter (Signed)
Message copied by Christen Butter on Tue Nov 04, 2012 10:26 AM ------      Message from: Max Fickle B      Created: Tue Nov 04, 2012 10:21 AM       L,            Can we order the following for her at home:            resmed s9 escape/auto device, and have them put on 5-20cm range for 2-3 weeks with download to [place]            Thanks,      B             ------

## 2012-11-05 ENCOUNTER — Telehealth: Payer: Self-pay | Admitting: *Deleted

## 2012-11-05 DIAGNOSIS — G4733 Obstructive sleep apnea (adult) (pediatric): Secondary | ICD-10-CM

## 2012-11-05 NOTE — Telephone Encounter (Signed)
Order was sent to [facility] 

## 2012-11-05 NOTE — Telephone Encounter (Signed)
Message copied by Christen Butter on Wed Nov 05, 2012  1:43 PM ------      Message from: Modesto Charon      Created: Wed Nov 05, 2012 12:09 PM      Regarding: ONO on cpap       [facility] needs an order for ONO on cpap before can start 02 2l @ bedtime. Per Medicare requirement, Melissa @ [facility] liasion. Can this order be put in?  ------

## 2012-11-07 ENCOUNTER — Encounter: Payer: Self-pay | Admitting: Pulmonary Disease

## 2012-11-11 ENCOUNTER — Ambulatory Visit: Payer: Self-pay | Admitting: Internal Medicine

## 2012-11-11 ENCOUNTER — Ambulatory Visit: Payer: 59 | Admitting: Pulmonary Disease

## 2012-11-11 ENCOUNTER — Telehealth: Payer: Self-pay | Admitting: Internal Medicine

## 2012-11-11 ENCOUNTER — Ambulatory Visit (INDEPENDENT_AMBULATORY_CARE_PROVIDER_SITE_OTHER): Payer: 59 | Admitting: Pulmonary Disease

## 2012-11-11 ENCOUNTER — Other Ambulatory Visit (HOSPITAL_COMMUNITY): Payer: 59

## 2012-11-11 ENCOUNTER — Encounter: Payer: Self-pay | Admitting: Pulmonary Disease

## 2012-11-11 VITALS — BP 90/60 | HR 79 | Temp 97.9°F | Ht 65.0 in | Wt 205.8 lb

## 2012-11-11 DIAGNOSIS — J9610 Chronic respiratory failure, unspecified whether with hypoxia or hypercapnia: Secondary | ICD-10-CM

## 2012-11-11 DIAGNOSIS — J9611 Chronic respiratory failure with hypoxia: Secondary | ICD-10-CM

## 2012-11-11 DIAGNOSIS — R0600 Dyspnea, unspecified: Secondary | ICD-10-CM

## 2012-11-11 DIAGNOSIS — R0609 Other forms of dyspnea: Secondary | ICD-10-CM

## 2012-11-11 DIAGNOSIS — E785 Hyperlipidemia, unspecified: Secondary | ICD-10-CM

## 2012-11-11 DIAGNOSIS — G4733 Obstructive sleep apnea (adult) (pediatric): Secondary | ICD-10-CM

## 2012-11-11 DIAGNOSIS — J8410 Pulmonary fibrosis, unspecified: Secondary | ICD-10-CM

## 2012-11-11 MED ORDER — SIMVASTATIN 20 MG PO TABS: 20.0000 mg | ORAL_TABLET | Freq: Every day | ORAL | Status: DC

## 2012-11-11 NOTE — Assessment & Plan Note (Signed)
I am reassured that Betty [name]'s sniff test showed normal diaphragm movement.  While this is not a perfect test for diaphragm weakness, it is more sensitive than the MIP/MEP.  At this point the ddx for her dyspnea is largely in two categories: ILD vs pulmonary hypertension. There is more objective evidence for ILD based on her CT scan and normal echo.  We will continue the work up with the following:  Plan: -hypersensitivity pneumonitis panel today -repeat CT chest with contrast to evaluate for PE (chronic thromboembolic disease) -CT chest ILD protocol (inspiratory/expiratory, high-res) to evaluate for airtrapping -may ultimately need an open lung biopsy -start 2L O2 with exertion and sleep

## 2012-11-11 NOTE — Telephone Encounter (Signed)
Rx was sent to [facility], they do not use [facility] anymore.

## 2012-11-11 NOTE — Telephone Encounter (Signed)
simvastatin (ZOCOR) 20 MG tablet   #90

## 2012-11-11 NOTE — Progress Notes (Signed)
Subjective:    Patient ID: Betty [name], female    DOB: 12-24-1945, 67 y.o.   MRN: [id number]  Synopsis: Betty [name] is a very pleasant female who first saw the [facility] pulmonary clinic in December 2013 for evaluation of shortness of breath. She is a lifetime nonsmoker. She developed increasing shortness of breath over the course of 2 years about 2011 to 2013. Pulmonary function testing showed restrictive lung disease with a markedly depressed DLCO at 35% predicted. CT scanning of her chest showed upper lobe groundglass abnormalities versus air trapping.  HPI   10/21/2012 routine office visit--Alexiss it says that she feels about the same since the last visit. Her husband is with her today. He states that there has been a clear change in her ability to exercise in the last 8 months. She can now "barely walk to the car without getting short of breath". She has not had a change in her mild dry cough. She does not have chest pain she has not had increasing leg swelling. She tells me that during the polysomnogram she did not sleep. There have been no changes to her medications since the last visit.  11/11/2012 ROV > Madason says that she has been doing the same since the last visit. She still has fatigue and shortness of breath. She denies significant cough and denies sputum production. She has no new pain or swelling anywhere since the last visit. She is supposed to have CPAP set up in her home on Thursday of this week. Today in the office she desaturated with walking.    Past Medical History  Diagnosis Date  . Hyperlipidemia   . Gout   . NICM (nonischemic cardiomyopathy) 2002    EF 25%; improved to normal - echo 4/08: EF 50-55%, mild MR, mild LAE, mild TR;    cath 3/03: normal cors, EF 40%  . Glucose intolerance (impaired glucose tolerance)   . History of chicken pox   . Allergic rhinitis     never tested, fall and spring    Review of Systems  Constitutional: Positive for  fatigue. Negative for fever and chills.  HENT: Negative for congestion, rhinorrhea and postnasal drip.   Respiratory: Positive for shortness of breath. Negative for cough, wheezing and stridor.   Cardiovascular: Positive for leg swelling. Negative for chest pain and palpitations.       Objective:   Physical Exam  Filed Vitals:   11/11/12 1005  BP: 90/60  Pulse: 79  Temp: 97.9 F (36.6 C)  TempSrc: Oral  Height: 5\' 5"  (1.651 m)  Weight: 205 lb 12.8 oz (93.35 kg)  SpO2: 90%  RA  Desaturated to 83% with exertion  Gen: well appearing, no acute distress HEENT: NCAT, EOMi, OP clear,  PULM: CTA B, no wheezing today CV: RRR, no mgr, no JVD AB: BS+, soft, nontender, no hsm Ext: warm, trace ankle edema, no clubbing, no cyanosis Neuro: A&Ox4, CN II-XII intact, strength 5/5 in all 4 extremities   06/2012 Echo>> grade 1 diastolic dysfunction, LVEF 55%, RV/RA normal, triscupid regurge jet velocity WNL 08/2012 PFT [facility] >> Ratio normal, Flow volume loop normal; FVC 1.46 L (48% pred), TLC 2.77 L (55% pred) ERV 0.03 (3% pred), DLCO 8.4 (35% pred) 08/2012 ABG >> 7.39/52/57/31.5/92% 10/01/2012 PSG>> poor sleep efficiency; AHI < 5; desaturated as low as 82% 09/2012 PFT [facility] >> unchanged from 08/2012 study. 10/20/2012 MIP/MEP >> 36 (36% pred)/-20 (28% pred) 09/2012 serology>> ANA neg, Anti-SCL70 neg, SSA/SSB neg/neg, Anti-Jo-1 neg, Aldolase  5, CRP 5, ESR 30, RF 31 10/24/2012 Sniff test >> normal diaphragm movement 09/2012 Home PSG >> AHI 12, several desaturation events independent of apnea       Assessment & Plan:   Dyspnea I am reassured that Tyrena's sniff test showed normal diaphragm movement.  While this is not a perfect test for diaphragm weakness, it is more sensitive than the MIP/MEP.  At this point the ddx for her dyspnea is largely in two categories: ILD vs pulmonary hypertension. There is more objective evidence for ILD based on her CT scan and normal echo.  We will continue the work up  with the following:  Plan: -hypersensitivity pneumonitis panel today -repeat CT chest with contrast to evaluate for PE (chronic thromboembolic disease) -CT chest ILD protocol (inspiratory/expiratory, high-res) to evaluate for airtrapping -may ultimately need an open lung biopsy -start 2L O2 with exertion and sleep  Chronic hypoxemic respiratory failure 11/11/2012 > desaturated to 83% on RA walking <200 feet in office; started on 2L  with good response;  I advised her to use this at night and with exertion  Obstructive sleep apnea Based on her home sleep study, she has sleep apnea.  This can certainly explain some of her systemic complaints (fatigue, lack of concentration).   Plan: -start home CPAP titration with a res-med s9 autotitration device with pressure settings between 5-20cm/H20    Updated Medication List Outpatient Encounter Prescriptions as of 11/11/2012  Medication Sig Dispense Refill  . aspirin 81 MG EC tablet Take 81 mg by mouth daily.        . Calcium Citrate-Vitamin D 200-125 MG-UNIT TABS Take by mouth daily.        . carvedilol (COREG) 25 MG tablet Take 1 tablet (25 mg total) by mouth 2 (two) times daily.  180 tablet  3  . Cholecalciferol (VITAMIN D3) 1000 UNITS tablet Take 1,000 Units by mouth daily.        . Coenzyme Q10 200 MG capsule Take 200 mg by mouth daily.        . colchicine 0.6 MG tablet Take 0.6 mg by mouth. 1 two times a day to three times a day as needed for gout       . dextromethorphan (DELSYM) 30 MG/5ML liquid Take 10 mLs (60 mg total) by mouth as needed.  89 mL    . dextromethorphan-guaiFENesin (MUCINEX DM) 30-600 MG per 12 hr tablet Take 1 tablet by mouth as needed.      Marland Kitchen losartan (COZAAR) 50 MG tablet One half tab every day.  90 tablet  3  . mometasone (NASONEX) 50 MCG/ACT nasal spray Place 2 sprays into the nose daily.  17 g  2  . multivitamin (THERAGRAN) per tablet Take 1 tablet by mouth daily.       . Omega-3 Fatty Acids (FISH OIL) 1000 MG  CPDR Take 1,200 mg by mouth daily.       . potassium chloride (K-DUR,KLOR-CON) 10 MEQ tablet Take 1 tablet (10 mEq total) by mouth daily.  90 tablet  3  . simvastatin (ZOCOR) 20 MG tablet Take 1 tablet (20 mg total) by mouth daily.  90 tablet  3   No facility-administered encounter medications on file as of 11/11/2012.

## 2012-11-11 NOTE — Assessment & Plan Note (Signed)
Based on her home sleep study, she has sleep apnea.  This can certainly explain some of her systemic complaints (fatigue, lack of concentration).   Plan: -start home CPAP titration with a res-med s9 autotitration device with pressure settings between 5-20cm/H20

## 2012-11-11 NOTE — Patient Instructions (Signed)
Use the CPAP machine at home after you receive it on Thursday Use the oxygen 2L with exertion and at night while you are sleeping  We will send you for a CT scan of your chest at [facility]  We will see you back in 3 weeks or sooner if needed

## 2012-11-11 NOTE — Assessment & Plan Note (Addendum)
11/11/2012 > desaturated to 83% on RA walking <200 feet in office; started on 2L [place] with good response;  I advised her to use this at night and with exertion

## 2012-11-12 LAB — BASIC METABOLIC PANEL
BUN: 14 mg/dL (ref 6–23)
CO2: 26 mEq/L (ref 19–32)
Calcium: 9.9 mg/dL (ref 8.4–10.5)
Creat: 0.63 mg/dL (ref 0.50–1.10)

## 2012-11-13 ENCOUNTER — Telehealth: Payer: Self-pay | Admitting: Internal Medicine

## 2012-11-13 ENCOUNTER — Encounter (HOSPITAL_COMMUNITY): Payer: Self-pay

## 2012-11-13 ENCOUNTER — Ambulatory Visit (HOSPITAL_COMMUNITY)
Admission: RE | Admit: 2012-11-13 | Discharge: 2012-11-13 | Disposition: A | Discharge status: Home / Self Care | Payer: Medicare Other | Source: Ambulatory Visit | Attending: Pulmonary Disease | Admitting: Pulmonary Disease

## 2012-11-13 DIAGNOSIS — J8410 Pulmonary fibrosis, unspecified: Secondary | ICD-10-CM

## 2012-11-13 DIAGNOSIS — R0602 Shortness of breath: Secondary | ICD-10-CM | POA: Insufficient documentation

## 2012-11-13 DIAGNOSIS — I2510 Atherosclerotic heart disease of native coronary artery without angina pectoris: Secondary | ICD-10-CM | POA: Insufficient documentation

## 2012-11-13 MED ORDER — IOHEXOL 350 MG/ML SOLN
100.0000 mL | Freq: Once | INTRAVENOUS | Status: AC | PRN
  Administered 2012-11-13: 100 mL via INTRAVENOUS

## 2012-11-13 NOTE — Telephone Encounter (Signed)
Spoke with patient and it has been scheduled for tomorrow.

## 2012-11-13 NOTE — Telephone Encounter (Signed)
Left message to call back  

## 2012-11-13 NOTE — Telephone Encounter (Signed)
Mammogram from 11/11/2012 requested additional imaging. Has this been scheduled.

## 2012-11-14 ENCOUNTER — Telehealth: Payer: Self-pay | Admitting: Pulmonary Disease

## 2012-11-14 ENCOUNTER — Ambulatory Visit: Payer: Self-pay | Admitting: Internal Medicine

## 2012-11-14 ENCOUNTER — Telehealth: Payer: Self-pay | Admitting: Internal Medicine

## 2012-11-14 DIAGNOSIS — J8410 Pulmonary fibrosis, unspecified: Secondary | ICD-10-CM

## 2012-11-14 MED ORDER — AZITHROMYCIN 250 MG PO TABS: ORAL_TABLET | ORAL | Status: AC

## 2012-11-14 NOTE — Telephone Encounter (Signed)
Follow up ultrasound and mammogram with compression views from 11/14/2012 was normal.

## 2012-11-14 NOTE — Telephone Encounter (Signed)
Called and spoke with pt and she stated that she saw BQ on 11/11/12 and she is still having increase SOB, but is also now having cough with yellow sputum and nasal congestion.  Pt is  Requesting that something be called in to her pharmacy.  Will send to doc of the day.  MW please advise. Thanks  Allergies  Allergen Reactions  . Amoxicillin     REACTION: RASH  . Sulfonamide Derivatives     REACTION: RASH

## 2012-11-14 NOTE — Telephone Encounter (Signed)
[id number] 

## 2012-11-14 NOTE — Telephone Encounter (Signed)
Rx has been sent in. Pt is aware. 

## 2012-11-14 NOTE — Telephone Encounter (Signed)
Notes Recorded by Lupita Leash, MD on 11/14/2012 at 12:14 AM Betty [name],  Please let her know that she did not have a blood clot and that her lungs looked good on this CT scan. This is a good thing. I want to see her after she has had her CPAP machine on for a while and see how she is doing.  Can we have her get an echocardiogram with a bubble study at Dr. Windell Hummingbird office? I realize she just had an echo in January, but this one would be to evaluate for shunt and pulmonary hypertension (please put that in the indication). If she could have this before I see her next that would be great. -----  I spoke with patient about results and she verbalized understanding and had no questions. Order has been placed.

## 2012-11-17 ENCOUNTER — Telehealth: Payer: Self-pay | Admitting: *Deleted

## 2012-11-17 ENCOUNTER — Telehealth: Payer: Self-pay | Admitting: Internal Medicine

## 2012-11-17 ENCOUNTER — Other Ambulatory Visit (HOSPITAL_COMMUNITY): Payer: Self-pay | Admitting: Pulmonary Disease

## 2012-11-17 ENCOUNTER — Ambulatory Visit: Payer: Self-pay | Admitting: Diagnostic Neuroimaging

## 2012-11-17 DIAGNOSIS — I2789 Other specified pulmonary heart diseases: Secondary | ICD-10-CM

## 2012-11-17 NOTE — Telephone Encounter (Signed)
Message copied by Christen Butter on Mon Nov 17, 2012  1:44 PM ------      Message from: Max Fickle B      Created: Mon Nov 17, 2012  1:03 PM       L,            Can you help me track down the results of her hypersensitivity pneumonitis profile?            Thanks,      B ------

## 2012-11-17 NOTE — Telephone Encounter (Signed)
Patient has been given the results, they were normal. Please refer to previous encounter for more information

## 2012-11-17 NOTE — Telephone Encounter (Signed)
Patient informed and verbally agreed.  

## 2012-11-17 NOTE — Telephone Encounter (Signed)
Patient wanting results of he 2nd Mammogram Screeing.

## 2012-11-17 NOTE — Telephone Encounter (Signed)
Called [facility] and spoke with [facility] She states that the results are still pending She is going to check on how much longer until this is resulted and call me back  ([place] [phone] option # 2 ID # [id number])

## 2012-11-18 ENCOUNTER — Telehealth: Payer: Self-pay | Admitting: Pulmonary Disease

## 2012-11-18 NOTE — Telephone Encounter (Signed)
I spoke with Betty [name]. She stated pt hypersensitivity results will be in on Friday. FYI for dr. Henrene Pastor.

## 2012-11-20 ENCOUNTER — Ambulatory Visit (HOSPITAL_COMMUNITY): Payer: Medicare Other | Attending: Pulmonary Disease

## 2012-11-20 ENCOUNTER — Encounter (HOSPITAL_COMMUNITY): Payer: Self-pay

## 2012-11-20 DIAGNOSIS — G4730 Sleep apnea, unspecified: Secondary | ICD-10-CM | POA: Insufficient documentation

## 2012-11-20 DIAGNOSIS — I428 Other cardiomyopathies: Secondary | ICD-10-CM | POA: Insufficient documentation

## 2012-11-20 DIAGNOSIS — R0609 Other forms of dyspnea: Secondary | ICD-10-CM | POA: Insufficient documentation

## 2012-11-20 DIAGNOSIS — R0989 Other specified symptoms and signs involving the circulatory and respiratory systems: Secondary | ICD-10-CM | POA: Insufficient documentation

## 2012-11-20 DIAGNOSIS — E785 Hyperlipidemia, unspecified: Secondary | ICD-10-CM | POA: Insufficient documentation

## 2012-11-20 DIAGNOSIS — J8410 Pulmonary fibrosis, unspecified: Secondary | ICD-10-CM | POA: Insufficient documentation

## 2012-11-20 DIAGNOSIS — I2789 Other specified pulmonary heart diseases: Secondary | ICD-10-CM | POA: Insufficient documentation

## 2012-11-20 NOTE — Progress Notes (Signed)
Patient ID: Betty [name], female   DOB: 1946/02/21, 67 y.o.   MRN: [id number] IV with 22 G angiocath (R) Ac x 1 for Echo Bubble Study, site unremarkable, tolerated well. Irean Hong, RN.

## 2012-11-20 NOTE — Telephone Encounter (Signed)
Called [facility] to check on this again Spoke with Marylene Land and she states will check on this and call me back tomorrow

## 2012-11-20 NOTE — Progress Notes (Signed)
Echocardiogram performed.  

## 2012-11-21 LAB — HYPERSENSITIVITY PNUEMONITIS PROFILE

## 2012-11-21 NOTE — Telephone Encounter (Signed)
Spoke with pt and advised her results just became available for review and we have Dr Kendrick Fries look at these and call her next wk She verbalized understanding and states nothing further needed Please advise, thanks!

## 2012-11-21 NOTE — Telephone Encounter (Signed)
    Patient calling for lab results 

## 2012-11-24 ENCOUNTER — Telehealth: Payer: Self-pay | Admitting: *Deleted

## 2012-11-24 ENCOUNTER — Encounter: Payer: Self-pay | Admitting: Pulmonary Disease

## 2012-11-24 DIAGNOSIS — R0600 Dyspnea, unspecified: Secondary | ICD-10-CM

## 2012-11-24 DIAGNOSIS — G4730 Sleep apnea, unspecified: Secondary | ICD-10-CM

## 2012-11-24 NOTE — Progress Notes (Signed)
Quick Note:  Spoke with pt and notified of results per Dr. McQuaid. Pt verbalized understanding and denied any questions.  ______ 

## 2012-11-24 NOTE — Telephone Encounter (Signed)
Pt returned Linday's call & can be reached at [phone].  Antionette Fairy

## 2012-11-24 NOTE — Telephone Encounter (Signed)
L,  Please let her know that after reviewing the CT scan and the lab work that things are favoring that her breathing problem looks more like a problem with the vessels in her lungs than her lungs themselves.  We will see her after the echocardiogram has been performed (please double check that this was ordered).  Thanks, B

## 2012-11-24 NOTE — Telephone Encounter (Signed)
LMTCB

## 2012-11-24 NOTE — Telephone Encounter (Signed)
Message copied by Caryl Ada on Mon Nov 24, 2012  2:31 PM ------      Message from: Max Fickle B      Created: Mon Nov 24, 2012  9:02 AM       L,            Please find out if she is using 2L O2 bleed in with her CPAP machine.  If not, we need to add this and repeat the ONO.            Thanks,      B ------

## 2012-11-24 NOTE — Telephone Encounter (Signed)
Pt states that she does not have O2 bleeding through her CPAP machine.  An order will be placed for the O2 and the repeat ONO.

## 2012-11-25 NOTE — Telephone Encounter (Signed)
Pt returned Leslie's call & can be reached at [phone].  Pt all ready has an appt w/ BQ set for 12/09/12.  Antionette Fairy

## 2012-11-25 NOTE — Telephone Encounter (Signed)
I have already reviewed her ECHO results with her yesterday LMTCB to give lab results and schedule rov with Dr Kendrick Fries

## 2012-11-27 ENCOUNTER — Telehealth: Payer: Self-pay | Admitting: Pulmonary Disease

## 2012-11-27 NOTE — Telephone Encounter (Signed)
Spoke with pt and advised keep appt with BQ for 12/09/12  Labs were normal

## 2012-11-28 ENCOUNTER — Ambulatory Visit (INDEPENDENT_AMBULATORY_CARE_PROVIDER_SITE_OTHER): Payer: Medicare Other | Admitting: Internal Medicine

## 2012-11-28 ENCOUNTER — Encounter: Payer: Self-pay | Admitting: Internal Medicine

## 2012-11-28 ENCOUNTER — Other Ambulatory Visit (HOSPITAL_COMMUNITY)
Admission: RE | Admit: 2012-11-28 | Discharge: 2012-11-28 | Disposition: A | Discharge status: Home / Self Care | Payer: Medicare Other | Source: Ambulatory Visit | Attending: Internal Medicine | Admitting: Internal Medicine

## 2012-11-28 VITALS — BP 136/70 | HR 80 | Temp 98.0°F | Resp 18 | Ht 65.0 in | Wt 203.5 lb

## 2012-11-28 DIAGNOSIS — Z01419 Encounter for gynecological examination (general) (routine) without abnormal findings: Secondary | ICD-10-CM | POA: Insufficient documentation

## 2012-11-28 DIAGNOSIS — I10 Essential (primary) hypertension: Secondary | ICD-10-CM

## 2012-11-28 DIAGNOSIS — Z1151 Encounter for screening for human papillomavirus (HPV): Secondary | ICD-10-CM | POA: Insufficient documentation

## 2012-11-28 DIAGNOSIS — Z0000 Encounter for general adult medical examination without abnormal findings: Secondary | ICD-10-CM | POA: Insufficient documentation

## 2012-11-28 MED ORDER — LOSARTAN POTASSIUM 50 MG PO TABS: ORAL_TABLET | ORAL | Status: DC

## 2012-11-28 MED ORDER — POTASSIUM CHLORIDE CRYS ER 10 MEQ PO TBCR: 10.0000 meq | EXTENDED_RELEASE_TABLET | Freq: Every day | ORAL | Status: DC

## 2012-11-28 MED ORDER — CARVEDILOL 25 MG PO TABS: 25.0000 mg | ORAL_TABLET | Freq: Two times a day (BID) | ORAL | Status: DC

## 2012-11-28 NOTE — Progress Notes (Signed)
Subjective:    Patient ID: Betty [name], female    DOB: Dec 30, 1945, 67 y.o.   MRN: [id number]  HPI The patient is here for annual Medicare wellness examination and management of other chronic and acute problems.   The risk factors are reflected in the social history.  The roster of all physicians providing medical care to patient - is listed in the Snapshot section of the chart.  Activities of daily living:  The patient is 100% independent in all ADLs: dressing, toileting, feeding as well as independent mobility  Home safety : The patient has smoke detectors in the home. They wear seatbelts.  There are no firearms at home. There is no violence in the home.   There is no risks for hepatitis, STDs or HIV. There is no history of blood transfusion. They have no travel history to infectious disease endemic areas of the world.  The patient has seen their dentist in the last six month. (Dr. Jake Shark) They have seen their eye doctor in the last year. (Dr. Alinda Money) No issues with hearing. They have deferred audiologic testing in the last year.    They do not  have excessive sun exposure. Discussed the need for sun protection: hats, long sleeves and use of sunscreen if there is significant sun exposure.   Diet: the importance of a healthy diet is discussed. They do have a healthy diet.  The benefits of regular aerobic exercise were discussed. Exercise limited by dyspnea.  Depression screen: there are no signs or vegative symptoms of depression- irritability, change in appetite, anhedonia, sadness/tearfullness.  Cognitive assessment: the patient manages all their financial and personal affairs and is actively engaged. They could relate day,date,year and events.  HCPOA - none at present  The following portions of the patient's history were reviewed and updated as appropriate: allergies, current medications, past family history, past medical history,  past surgical history, past social  history  and problem list.  Visual acuity was not assessed per patient preference since she has regular follow up with her ophthalmologist. Hearing and body mass index were assessed and reviewed.   During the course of the visit the patient was educated and counseled about appropriate screening and preventive services including : fall prevention , diabetes screening, nutrition counseling, colorectal cancer screening, and recommended immunizations.    Outpatient Encounter Prescriptions as of 11/28/2012  Medication Sig Dispense Refill  . aspirin 81 MG EC tablet Take 81 mg by mouth daily.        . Calcium Citrate-Vitamin D 200-125 MG-UNIT TABS Take by mouth daily.        . carvedilol (COREG) 25 MG tablet Take 1 tablet (25 mg total) by mouth 2 (two) times daily.  180 tablet  3  . Cholecalciferol (VITAMIN D3) 1000 UNITS tablet Take 1,000 Units by mouth daily.        . Coenzyme Q10 200 MG capsule Take 200 mg by mouth daily.        . colchicine 0.6 MG tablet Take 0.6 mg by mouth. 1 two times a day to three times a day as needed for gout       . dextromethorphan (DELSYM) 30 MG/5ML liquid Take 10 mLs (60 mg total) by mouth as needed.  89 mL    . dextromethorphan-guaiFENesin (MUCINEX DM) 30-600 MG per 12 hr tablet Take 1 tablet by mouth as needed.      Marland Kitchen losartan (COZAAR) 50 MG tablet One half tab every day.  90 tablet  3  . mometasone (NASONEX) 50 MCG/ACT nasal spray Place 2 sprays into the nose daily.  17 g  2  . multivitamin (THERAGRAN) per tablet Take 1 tablet by mouth daily.       . Omega-3 Fatty Acids (FISH OIL) 1000 MG CPDR Take 1,200 mg by mouth daily.       . potassium chloride (K-DUR,KLOR-CON) 10 MEQ tablet Take 1 tablet (10 mEq total) by mouth daily.  90 tablet  3  . simvastatin (ZOCOR) 20 MG tablet Take 1 tablet (20 mg total) by mouth daily.  90 tablet  1   No facility-administered encounter medications on file as of 11/28/2012.   BP 136/70  Pulse 80  Temp(Src) 98 F (36.7 C) (Oral)  Resp  18  Ht 5\' 5"  (1.651 m)  Wt 203 lb 8 oz (92.307 kg)  BMI 33.86 kg/m2  SpO2 90%   Review of Systems  Constitutional: Negative for fever, chills, appetite change, fatigue and unexpected weight change.  HENT: Negative for ear pain, congestion, sore throat, trouble swallowing, neck pain, voice change and sinus pressure.   Eyes: Negative for visual disturbance.  Respiratory: Positive for shortness of breath. Negative for cough, wheezing and stridor.   Cardiovascular: Negative for chest pain, palpitations and leg swelling.  Gastrointestinal: Negative for nausea, vomiting, abdominal pain, diarrhea, constipation, blood in stool, abdominal distention and anal bleeding.  Genitourinary: Negative for dysuria and flank pain.  Musculoskeletal: Negative for myalgias, arthralgias and gait problem.  Skin: Negative for color change and rash.  Neurological: Negative for dizziness and headaches.  Hematological: Negative for adenopathy. Does not bruise/bleed easily.  Psychiatric/Behavioral: Negative for suicidal ideas, sleep disturbance and dysphoric mood. The patient is not nervous/anxious.        Objective:   Physical Exam  Constitutional: She is oriented to person, place, and time. She appears well-developed and well-nourished. No distress.  HENT:  Head: Normocephalic and atraumatic.  Right Ear: External ear normal.  Left Ear: External ear normal.  Nose: Nose normal.  Mouth/Throat: Oropharynx is clear and moist. No oropharyngeal exudate.  Eyes: Conjunctivae are normal. Pupils are equal, round, and reactive to light. Right eye exhibits no discharge. Left eye exhibits no discharge. No scleral icterus.  Neck: Normal range of motion. Neck supple. No tracheal deviation present. No thyromegaly present.  Cardiovascular: Normal rate, regular rhythm, normal heart sounds and intact distal pulses.  Exam reveals no gallop and no friction rub.   No murmur heard. Pulmonary/Chest: Effort normal and breath sounds  normal. No respiratory distress. She has no wheezes. She has no rales. She exhibits no tenderness.  Abdominal: Soft. Bowel sounds are normal. She exhibits no distension and no mass. There is no tenderness. There is no rebound and no guarding.  Genitourinary: Rectum normal, vagina normal and uterus normal. No breast swelling, tenderness, discharge or bleeding. Pelvic exam was performed with patient supine. There is no rash, tenderness or lesion on the right labia. There is no rash, tenderness or lesion on the left labia. Uterus is not enlarged and not tender. Cervix exhibits no motion tenderness, no discharge and no friability. Right adnexum displays no mass, no tenderness and no fullness. Left adnexum displays no mass, no tenderness and no fullness. No erythema or tenderness around the vagina. No vaginal discharge found.  Musculoskeletal: Normal range of motion. She exhibits no edema and no tenderness.  Lymphadenopathy:    She has no cervical adenopathy.  Neurological: She is alert and oriented to person, place, and time.  No cranial nerve deficit. She exhibits normal muscle tone. Coordination normal.  Skin: Skin is warm and dry. No rash noted. She is not diaphoretic. No erythema. No pallor.  Psychiatric: She has a normal mood and affect. Her behavior is normal. Judgment and thought content normal.          Assessment & Plan:

## 2012-11-28 NOTE — Assessment & Plan Note (Addendum)
General medical exam including breast and pelvic exam normal today. Pap is pending. Recent mammogram required additional views but ultimately was normal. Colonoscopy UTD. Extensive recent labs including evaluation for cause of dyspnea normal. Will hold off on additional labs for now. Consider repeat lipids, CMP, CBC in 3 months. Encouraged healthy diet and exercise as tolerated, recognizing limitation with dyspnea. Encouraged her to set up HCPOA. Appropriate screening performed. Follow up 3 months.

## 2012-12-01 ENCOUNTER — Encounter: Payer: Self-pay | Admitting: Internal Medicine

## 2012-12-03 ENCOUNTER — Encounter: Payer: Self-pay | Admitting: Internal Medicine

## 2012-12-09 ENCOUNTER — Ambulatory Visit (INDEPENDENT_AMBULATORY_CARE_PROVIDER_SITE_OTHER): Payer: Medicare Other | Admitting: Pulmonary Disease

## 2012-12-09 ENCOUNTER — Encounter: Payer: Self-pay | Admitting: Pulmonary Disease

## 2012-12-09 VITALS — BP 100/62 | HR 81 | Temp 98.0°F | Ht 65.0 in | Wt 203.0 lb

## 2012-12-09 DIAGNOSIS — R0609 Other forms of dyspnea: Secondary | ICD-10-CM

## 2012-12-09 DIAGNOSIS — I2699 Other pulmonary embolism without acute cor pulmonale: Secondary | ICD-10-CM

## 2012-12-09 DIAGNOSIS — J9610 Chronic respiratory failure, unspecified whether with hypoxia or hypercapnia: Secondary | ICD-10-CM

## 2012-12-09 DIAGNOSIS — I2724 Chronic thromboembolic pulmonary hypertension: Secondary | ICD-10-CM

## 2012-12-09 DIAGNOSIS — R0600 Dyspnea, unspecified: Secondary | ICD-10-CM

## 2012-12-09 DIAGNOSIS — R0989 Other specified symptoms and signs involving the circulatory and respiratory systems: Secondary | ICD-10-CM

## 2012-12-09 DIAGNOSIS — I2789 Other specified pulmonary heart diseases: Secondary | ICD-10-CM

## 2012-12-09 DIAGNOSIS — J9611 Chronic respiratory failure with hypoxia: Secondary | ICD-10-CM

## 2012-12-09 DIAGNOSIS — G4733 Obstructive sleep apnea (adult) (pediatric): Secondary | ICD-10-CM

## 2012-12-09 NOTE — Patient Instructions (Signed)
We will refer you for a perfusion study to evaluate for chronic thromboembolic pulmonary hypertension We will refer you to Dr. Mariah Milling and Dr. Kirke Corin for a R heart catheterization Use saline gel in your nose every night Keep using your CPAP and your Oxygen as you are doing We will see you back in 4 weeks or sooner if needed

## 2012-12-09 NOTE — Assessment & Plan Note (Signed)
Continue 2L O2 with exertion. 

## 2012-12-09 NOTE — Progress Notes (Signed)
Subjective:    Patient ID: Betty [name], female    DOB: [birth year], 67 y.o.   MRN: [id number]  Synopsis: Betty [name] is a very pleasant female who [facility] saw [facility] in December 2013 for evaluation of shortness of breath. She is a lifetime nonsmoker. She developed increasing shortness of breath over the course of 2 years about 2011 to 2013. Pulmonary function testing showed restrictive lung disease with a markedly depressed DLCO at 35% predicted. CT scanning of her chest showed upper lobe groundglass abnormalities versus air trapping.  A home polysomnogram showed an AHI of 12 and multiple desaturation events to 80% independent of apneas.  An abg showed resting hypercarbia (pCO2 52), and a MIP/MEP was 36%/28% predicted.  A follow up diaphragm study was normal.  A repeat CT scan was performed, no pulmonary embolism was seen and radiology commented on atelectasis but no clear intersitial lung disease.  Blood work including an HP panel and serologies for connective tissue diseases has been essentially negative (two partial aspergillus bands on HP panel).  Two echocardiograms have not shown evidence of pulmonary hypertension.  After starting on CPAP with nasal pillows and oxygen with exertion and sleep she started feeling better.    HPI   10/21/2012 routine office visit--Meha it says that she feels about the same since the last visit. Her husband is with her today. He states that there has been a clear change in her ability to exercise in the last 8 months. She can now "barely walk to the car without getting short of breath". She has not had a change in her mild dry cough. She does not have chest pain she has not had increasing leg swelling. She tells me that during the polysomnogram she did not sleep. There have been no changes to her medications since the last visit.  11/11/2012 ROV > [name] says that she has been doing the same since the last visit. She still has  fatigue and shortness of breath. She denies significant cough and denies sputum production. She has no new pain or swelling anywhere since the last visit. She is supposed to have CPAP set up in her home on Thursday of this week. Today in the office she desaturated with walking.    11/25/12 ROV > [name] says that she is feeling better and has more energy since using CPAP and oxygen regularly.  She states that her thinking is more clear than before.  She can walk further than before and overall feels better.  She has been having some sinus congestion with her CPAP at night which she is using with nasal pillows.  She is using CPAP every night.  She does not have leg swelling or chest pain.  She has not been cleaning her CPAP machine.    Past Medical History  Diagnosis Date  . Hyperlipidemia   . Gout   . NICM (nonischemic cardiomyopathy) 2002    EF 25%; improved to normal - echo 4/08: EF 50-55%, mild MR, mild LAE, mild TR;    cath 3/03: normal cors, EF 40%  . Glucose intolerance (impaired glucose tolerance)   . History of chicken pox   . Allergic rhinitis     never tested, fall and spring    Review of Systems  Constitutional: Negative for fever, chills and fatigue.  HENT: Negative for congestion, rhinorrhea and postnasal drip.   Respiratory: Positive for shortness of breath. Negative for cough, wheezing and stridor.   Cardiovascular: Negative for  chest pain, palpitations and leg swelling.       Objective:   Physical Exam  Filed Vitals:   12/09/12 1642  BP: 100/62  Pulse: 81  Temp: 98 F (36.7 C)  TempSrc: Oral  Height: 5\' 5"  (1.651 m)  Weight: 203 lb (92.08 kg)  SpO2: 94%  RA  Desaturated to 83% with exertion  Gen: well appearing, no acute distress HEENT: NCAT, EOMi, OP clear,  PULM: CTA B, no wheezing today CV: RRR, no mgr, no JVD AB: BS+, soft, nontender, no hsm Ext: warm, trace ankle edema, no clubbing, no cyanosis Neuro: A&Ox4, CN II-XII intact, strength 5/5 in all 4  extremities   06/2012 Echo>> grade 1 diastolic dysfunction, LVEF 55%, RV/RA normal, triscupid regurge jet velocity WNL 08/2012 PFT [facility] >> Ratio normal, Flow volume loop normal; FVC 1.46 L (48% pred), TLC 2.77 L (55% pred) ERV 0.03 (3% pred), DLCO 8.4 (35% pred) 08/2012 ABG >> 7.39/52/57/31.5/92% 10/01/2012 PSG>> poor sleep efficiency; AHI < 5; desaturated as low as 82% 09/2012 PFT [facility] >> unchanged from 08/2012 study. 10/20/2012 MIP/MEP >> 36 (36% pred)/-20 (28% pred) 09/2012 serology>> ANA neg, Anti-SCL70 neg, SSA/SSB neg/neg, Anti-Jo-1 neg, Aldolase 5, CRP 5, ESR 30, RF 31 10/24/2012 Sniff test >> normal diaphragm movement 09/2012 Home PSG >> AHI 12, several desaturation events independent of apnea 10/2012 Echo> normal RV/RA, no evidence of PH 10/2012 HP Panel> two partial aspergillus bands, otherwise negative 12/09/12 950 feet, O2 saturation > 92% on 2LNC       Assessment & Plan:   Obstructive sleep apnea -Continue CPAP with nasal pillows at current settings -add saline gel to nares at night  Chronic hypoxemic respiratory failure -Continue 2L O2 with exertion  Dyspnea The etiology of Saori's dyspnea and hypoxemia is still not clear.  There was no evidence of intersitial lung disease on her most recent CT scan of the chest. However, the radiologist did comment on atelectasis. As I review this study myself (and have discussed with some of my pulmonary colleagues) there appears to be mosaicism of the lung parenchyma suggestive of either air trapping or abnormal blood flow.  The differential diagnosis would include pulmonary hypertension vs bronchiolitis.  Considering the depressed DLCO, hypoxemia and the abnormal finding of the CT scan, I am concerned about pulmonary hypertension. She does not have evidence of RV dysfunction on echocardiogram which is encouraging. However, I would like for her to have a right heart catheterization for further evaluation.  Plan: -Obtain perfusion study at  the lungs to look for chronic thromboembolic disease -Obtain right heart catheterization and evaluation by [facility] in [place] -If right heart catheterization is normal, then consider referral to [facility] pulmonary medicine for further evaluation of dyspnea and hypoxemic respiratory failure.    Updated Medication List Outpatient Encounter Prescriptions as of 12/09/2012  Medication Sig Dispense Refill  . aspirin 81 MG EC tablet Take 81 mg by mouth daily.        . Calcium Citrate-Vitamin D 200-125 MG-UNIT TABS Take by mouth daily.        . carvedilol (COREG) 25 MG tablet Take 1 tablet (25 mg total) by mouth 2 (two) times daily.  180 tablet  3  . Cholecalciferol (VITAMIN D3) 1000 UNITS tablet Take 1,000 Units by mouth daily.        . Coenzyme Q10 200 MG capsule Take 200 mg by mouth daily.        . colchicine 0.6 MG tablet Take 0.6 mg by  mouth. 1 two times a day to three times a day as needed for gout       . dextromethorphan (DELSYM) 30 MG/5ML liquid Take 10 mLs (60 mg total) by mouth as needed.  89 mL    . dextromethorphan-guaiFENesin (MUCINEX DM) 30-600 MG per 12 hr tablet Take 1 tablet by mouth as needed.      Marland Kitchen losartan (COZAAR) 50 MG tablet One half tab every day.  90 tablet  3  . multivitamin (THERAGRAN) per tablet Take 1 tablet by mouth daily.       . Omega-3 Fatty Acids (FISH OIL) 1000 MG CPDR Take 1,200 mg by mouth daily.       . potassium chloride (K-DUR,KLOR-CON) 10 MEQ tablet Take 1 tablet (10 mEq total) by mouth daily.  90 tablet  3  . simvastatin (ZOCOR) 20 MG tablet Take 1 tablet (20 mg total) by mouth daily.  90 tablet  1  . mometasone (NASONEX) 50 MCG/ACT nasal spray Place 2 sprays into the nose daily.  17 g  2   No facility-administered encounter medications on file as of 12/09/2012.

## 2012-12-09 NOTE — Assessment & Plan Note (Addendum)
The etiology of Betty [name]'s dyspnea and hypoxemia is still not clear.  There was no evidence of intersitial lung disease on her most recent CT scan of the chest. However, the radiologist did comment on atelectasis. As I review this study myself (and have discussed with some of my pulmonary colleagues) there appears to be mosaicism of the lung parenchyma suggestive of either air trapping or abnormal blood flow.  The differential diagnosis would include pulmonary hypertension vs bronchiolitis.  Considering the depressed DLCO, hypoxemia and the abnormal finding of the CT scan, I am concerned about pulmonary hypertension. She does not have evidence of RV dysfunction on echocardiogram which is encouraging. However, I would like for her to have a right heart catheterization for further evaluation.  Plan: -Obtain perfusion study at the lungs to look for chronic thromboembolic disease -Obtain right heart catheterization and evaluation by [facility] in [place] -If right heart catheterization is normal, then consider referral to [facility] pulmonary medicine for further evaluation of dyspnea and hypoxemic respiratory failure.

## 2012-12-09 NOTE — Assessment & Plan Note (Signed)
-  Continue CPAP with nasal pillows at current settings -add saline gel to nares at night

## 2012-12-10 ENCOUNTER — Other Ambulatory Visit: Payer: Self-pay | Admitting: Pulmonary Disease

## 2012-12-10 DIAGNOSIS — I749 Embolism and thrombosis of unspecified artery: Secondary | ICD-10-CM

## 2012-12-15 ENCOUNTER — Telehealth: Payer: Self-pay | Admitting: Internal Medicine

## 2012-12-15 NOTE — Telephone Encounter (Signed)
Fine to give her a handicap sticker. I will sign.

## 2012-12-15 NOTE — Telephone Encounter (Signed)
Pt came in wanting to get her handicap stickers.  Pt stated she send my chart message to get these. Please advise pt when ready to pick up

## 2012-12-15 NOTE — Telephone Encounter (Signed)
Left detailed message on patient voicemail informing her form has been completed and it is up front for her to pick up.

## 2012-12-16 ENCOUNTER — Other Ambulatory Visit: Payer: Self-pay | Admitting: Pulmonary Disease

## 2012-12-16 ENCOUNTER — Encounter (HOSPITAL_COMMUNITY)
Admission: RE | Admit: 2012-12-16 | Discharge: 2012-12-16 | Disposition: A | Discharge status: Home / Self Care | Payer: Medicare Other | Source: Ambulatory Visit | Attending: Pulmonary Disease | Admitting: Pulmonary Disease

## 2012-12-16 ENCOUNTER — Telehealth: Payer: Self-pay | Admitting: Internal Medicine

## 2012-12-16 ENCOUNTER — Encounter: Payer: Self-pay | Admitting: Pulmonary Disease

## 2012-12-16 ENCOUNTER — Telehealth: Payer: Self-pay | Admitting: Pulmonary Disease

## 2012-12-16 DIAGNOSIS — I749 Embolism and thrombosis of unspecified artery: Secondary | ICD-10-CM

## 2012-12-16 DIAGNOSIS — I428 Other cardiomyopathies: Secondary | ICD-10-CM | POA: Insufficient documentation

## 2012-12-16 DIAGNOSIS — I2789 Other specified pulmonary heart diseases: Secondary | ICD-10-CM | POA: Insufficient documentation

## 2012-12-16 DIAGNOSIS — I2724 Chronic thromboembolic pulmonary hypertension: Secondary | ICD-10-CM

## 2012-12-16 DIAGNOSIS — R0602 Shortness of breath: Secondary | ICD-10-CM | POA: Insufficient documentation

## 2012-12-16 MED ORDER — TECHNETIUM TC 99M DIETHYLENETRIAME-PENTAACETIC ACID
43.8000 | Freq: Once | INTRAVENOUS | Status: AC | PRN
  Administered 2012-12-16: 43.8 via INTRAVENOUS

## 2012-12-16 MED ORDER — TECHNETIUM TO 99M ALBUMIN AGGREGATED
5.5000 | Freq: Once | INTRAVENOUS | Status: AC | PRN
  Administered 2012-12-16: 6 via INTRAVENOUS

## 2012-12-16 NOTE — Telephone Encounter (Signed)
Called patient to discuss results of V/Q scan and to let her know #1> no anticoagulation needed, #2> OK to proceed with RHC.  Had to leave detailed messaged and asked to call back if questions.

## 2012-12-16 NOTE — Telephone Encounter (Signed)
Amber can you help Ms Losey with the appointments she needs  Thanks     Thank you,Robin for your promptness.   I also want to schedule a foot exam with Dr Charlsie Merles at [facility] on [address] in [place],as well as   an eye exam with Dr. Druscilla Brownie at [facility].   Thanks.   Do you want me to schedule these or will you?      ----- Message -----   From: Katheren Puller   Sent: 12/15/2012 4:36 PM EDT   To: [name]   Subject: RE: Appointment Request (HM)      Appointment Wednesday 01/07/13 @ 2:45   Thank you   Zella Ball      ----- Message -----   From: [name]   Sent: 12/15/2012 4:20 PM EDT   To: Patient HM Schedule Request Mailing List   Subject: Appointment Request (HM)      Appointment Request From: [name]      With Provider: Wynona Dove, MD [-Primary Care Physician-]      Preferred Date Range: From 12/15/2012 To 01/23/2013      Preferred Times: Any      Reason: To address the following health maintenance concerns.   Foot Exam   Ophthalmology Exam   Hemoglobin A1c   Urine Microalbumin   Lipid Panel

## 2012-12-17 ENCOUNTER — Encounter: Payer: Self-pay | Admitting: Emergency Medicine

## 2012-12-18 ENCOUNTER — Telehealth: Payer: Self-pay | Admitting: Internal Medicine

## 2012-12-18 ENCOUNTER — Encounter: Payer: Self-pay | Admitting: Internal Medicine

## 2012-12-18 NOTE — Telephone Encounter (Signed)
Amber i think this is for you    Zella Ball,   I contacted [facility] to request an eye exam as per the e-mail that I received from your office. I was told I am not due another exam until Aug. 23 since I was just there in Aug 2013. I will go when it's time. Thanks.

## 2012-12-22 NOTE — Telephone Encounter (Signed)
Thank you,Robin for your promptness . I also want to schedule a foot exam with Dr Charlsie Merles at [facility] on [address] in [place],as well as an eye exam with Dr. Druscilla Brownie at [facility]. Thanks. Do you want me to schedule these or will you?  ----- Message -----  From: Katheren Puller Sent: 12/15/2012 4:36 PM EDT  To: [name] Subject: RE: Appointment Request (HM ) Appointment Wednesday 01/07/13 @ 2:45 Thank you Zella Ball - ---- Message -----  From: [name] Sent: 12/15/2012 4:20 PM EDT To: Patient HM Schedule Request Mailing List Subject:  Appointment Request  (HM) Appointment Request From: [name] With Provider: Wynona Dove, MD [-Primary Care Physician-] Preferred Date Range: From 12/15/2012 To 01/23/2013 Preferred Times: Any Reason: To address the following health maintenance concerns. Foot Exam Ophthalmology Exam Hemoglobin A1c Urine Microalbumin Lipid Panel Comments:

## 2012-12-22 NOTE — Telephone Encounter (Signed)
I have provided the patient with both numbers to schedule her appointments.

## 2012-12-23 ENCOUNTER — Telehealth: Payer: Self-pay | Admitting: *Deleted

## 2012-12-23 ENCOUNTER — Encounter: Payer: Self-pay | Admitting: Pulmonary Disease

## 2012-12-23 DIAGNOSIS — G4733 Obstructive sleep apnea (adult) (pediatric): Secondary | ICD-10-CM

## 2012-12-23 NOTE — Telephone Encounter (Signed)
Message copied by Christen Butter on Tue Dec 23, 2012  5:21 PM ------      Message from: Lupita Leash      Created: Tue Dec 23, 2012  4:48 PM       L,            Please let her know I saw her CPAP download data.  I want to make sure she is set on 12cm H20 at night.  Otherwise, keep doing what she is doing.            Thanks,      B ------

## 2012-12-23 NOTE — Telephone Encounter (Signed)
LMTCB for pt 

## 2012-12-24 NOTE — Telephone Encounter (Signed)
Patient returning call.

## 2012-12-25 NOTE — Telephone Encounter (Signed)
Spoke with pt and notified of recs per Dr. Kendrick Fries She verbalized understanding, but is unsure what the setting is at now I called [facility], spoke with Chastity and she states that the order is for auto 5/20  I will go ahead and send the [facility] order to have the pressure set at 12

## 2012-12-31 ENCOUNTER — Telehealth: Payer: Self-pay | Admitting: Internal Medicine

## 2012-12-31 ENCOUNTER — Telehealth: Payer: Self-pay | Admitting: *Deleted

## 2012-12-31 NOTE — Telephone Encounter (Signed)
Error

## 2012-12-31 NOTE — Telephone Encounter (Signed)
New problem ° ° °Pt returning your call. Please call pt. °

## 2013-01-01 ENCOUNTER — Encounter: Payer: Self-pay | Admitting: *Deleted

## 2013-01-01 NOTE — Telephone Encounter (Signed)
Right heart cath with sat run scheduled with Dr. Kirke Corin on 01/21/2013.

## 2013-01-01 NOTE — Telephone Encounter (Signed)
New Problem: ° ° ° °Patient called in wanting to speak with you.  Please call back. °

## 2013-01-05 ENCOUNTER — Encounter: Payer: Self-pay | Admitting: Pulmonary Disease

## 2013-01-05 ENCOUNTER — Telehealth: Payer: Self-pay | Admitting: Pulmonary Disease

## 2013-01-05 NOTE — Telephone Encounter (Signed)
Pt is scheduled to see BQ on 01/06/13 @ 9am. Per BQ - pt does not need to be seen until after she has her R heart cath done. She is not scheduled to have this done until 01/21/13. Her appointment has been rescheduled for 01/27/13 at 11am.

## 2013-01-06 ENCOUNTER — Ambulatory Visit: Payer: Medicare Other | Admitting: Pulmonary Disease

## 2013-01-06 LAB — HM DIABETES FOOT EXAM

## 2013-01-07 ENCOUNTER — Encounter: Payer: Self-pay | Admitting: Internal Medicine

## 2013-01-07 ENCOUNTER — Ambulatory Visit (INDEPENDENT_AMBULATORY_CARE_PROVIDER_SITE_OTHER): Payer: Medicare Other | Admitting: Internal Medicine

## 2013-01-07 VITALS — BP 130/72 | HR 84 | Temp 98.1°F | Wt 204.0 lb

## 2013-01-07 DIAGNOSIS — R0609 Other forms of dyspnea: Secondary | ICD-10-CM

## 2013-01-07 DIAGNOSIS — R609 Edema, unspecified: Secondary | ICD-10-CM | POA: Insufficient documentation

## 2013-01-07 DIAGNOSIS — E119 Type 2 diabetes mellitus without complications: Secondary | ICD-10-CM

## 2013-01-07 DIAGNOSIS — R0600 Dyspnea, unspecified: Secondary | ICD-10-CM

## 2013-01-07 MED ORDER — HYDROCHLOROTHIAZIDE 12.5 MG PO CAPS: 12.5000 mg | ORAL_CAPSULE | Freq: Every day | ORAL | Status: DC

## 2013-01-07 NOTE — Assessment & Plan Note (Signed)
Lab Results  Component Value Date   HGBA1C 7.0* 06/18/2011   Repeat A1c ordered today. Pt recently completed foot exam and eye exam. If A1c persistently elevated, will plan to set up diabetes education and start metformin.

## 2013-01-07 NOTE — Assessment & Plan Note (Signed)
Persistent dyspnea at rest and with exertion. Scheduled for right heart cath next week with Dr. Kirke Corin to evaluate further for pulmonary hypertension. Reviewed previous workup including recent CT chest and perfusion study with pt today.

## 2013-01-07 NOTE — Progress Notes (Signed)
Subjective:    Patient ID: Betty [name], female    DOB: 05-12-1946, 67 y.o.   MRN: [id number]  HPI 67 year old female with history of chronic dyspnea, hyperglycemia, hypertension, hyperlipidemia presents for followup. She does not regularly check her blood sugars. Last hemoglobin A 1c was checked in 2012 and was 7%. She is up-to-date on exam which was recently completed as well as eye exam which was reportedly normal. She has never taken medication to control blood sugars.  In regards to chronic dyspnea, she recently underwent CT of the chest and perfusion study both of which were normal. There was however some suggestion of pulmonary hypertension. Cardiac echo was normal. She is scheduled for right heart catheterization next week. She reports ongoing symptoms of shortness of breath both at rest and on exertion. No chest pain, fever, cough.  Outpatient Encounter Prescriptions as of 01/07/2013  Medication Sig Dispense Refill  . aspirin 81 MG EC tablet Take 81 mg by mouth daily.        . Calcium Citrate-Vitamin D 200-125 MG-UNIT TABS Take by mouth daily.        . carvedilol (COREG) 25 MG tablet Take 1 tablet (25 mg total) by mouth 2 (two) times daily.  180 tablet  3  . Cholecalciferol (VITAMIN D3) 1000 UNITS tablet Take 1,000 Units by mouth daily.        . Coenzyme Q10 200 MG capsule Take 200 mg by mouth daily.        . colchicine 0.6 MG tablet Take 0.6 mg by mouth. 1 two times a day to three times a day as needed for gout       . dextromethorphan (DELSYM) 30 MG/5ML liquid Take 10 mLs (60 mg total) by mouth as needed.  89 mL    . dextromethorphan-guaiFENesin (MUCINEX DM) 30-600 MG per 12 hr tablet Take 1 tablet by mouth as needed.      Marland Kitchen losartan (COZAAR) 50 MG tablet One half tab every day.  90 tablet  3  . mometasone (NASONEX) 50 MCG/ACT nasal spray Place 2 sprays into the nose daily.  17 g  2  . multivitamin (THERAGRAN) per tablet Take 1 tablet by mouth daily.       . Omega-3 Fatty Acids  (FISH OIL) 1000 MG CPDR Take 1,200 mg by mouth daily.       . potassium chloride (K-DUR,KLOR-CON) 10 MEQ tablet Take 1 tablet (10 mEq total) by mouth daily.  90 tablet  3  . simvastatin (ZOCOR) 20 MG tablet Take 1 tablet (20 mg total) by mouth daily.  90 tablet  1  . hydrochlorothiazide (MICROZIDE) 12.5 MG capsule Take 1 capsule (12.5 mg total) by mouth daily.  90 capsule  3   No facility-administered encounter medications on file as of 01/07/2013.   BP 130/72  Pulse 84  Temp(Src) 98.1 F (36.7 C) (Oral)  Wt 204 lb (92.534 kg)  BMI 33.95 kg/m2  SpO2 91%  Review of Systems  Constitutional: Negative for fever, chills, appetite change, fatigue and unexpected weight change.  HENT: Negative for ear pain, congestion, sore throat, trouble swallowing, neck pain, voice change and sinus pressure.   Eyes: Negative for visual disturbance.  Respiratory: Positive for shortness of breath. Negative for cough, wheezing and stridor.   Cardiovascular: Negative for chest pain, palpitations and leg swelling.  Gastrointestinal: Negative for nausea, vomiting, abdominal pain, diarrhea, constipation, blood in stool, abdominal distention and anal bleeding.  Genitourinary: Negative for dysuria and flank pain.  Musculoskeletal: Negative for myalgias, arthralgias and gait problem.  Skin: Negative for color change and rash.  Neurological: Negative for dizziness and headaches.  Hematological: Negative for adenopathy. Does not bruise/bleed easily.  Psychiatric/Behavioral: Negative for suicidal ideas, sleep disturbance and dysphoric mood. The patient is not nervous/anxious.        Objective:   Physical Exam  Constitutional: She is oriented to person, place, and time. She appears well-developed and well-nourished. No distress.  HENT:  Head: Normocephalic and atraumatic.  Right Ear: External ear normal.  Left Ear: External ear normal.  Nose: Nose normal.  Mouth/Throat: Oropharynx is clear and moist. No  oropharyngeal exudate.  Eyes: Conjunctivae are normal. Pupils are equal, round, and reactive to light. Right eye exhibits no discharge. Left eye exhibits no discharge. No scleral icterus.  Neck: Normal range of motion. Neck supple. No tracheal deviation present. No thyromegaly present.  Cardiovascular: Normal rate, regular rhythm, normal heart sounds and intact distal pulses.  Exam reveals no gallop and no friction rub.   No murmur heard. Pulmonary/Chest: Effort normal and breath sounds normal. No accessory muscle usage. Not tachypneic. No respiratory distress. She has no decreased breath sounds. She has no wheezes. She has no rhonchi. She has no rales. She exhibits no tenderness.  Musculoskeletal: Normal range of motion. She exhibits no edema and no tenderness.  Lymphadenopathy:    She has no cervical adenopathy.  Neurological: She is alert and oriented to person, place, and time. No cranial nerve deficit. She exhibits normal muscle tone. Coordination normal.  Skin: Skin is warm and dry. No rash noted. She is not diaphoretic. No erythema. No pallor.  Psychiatric: She has a normal mood and affect. Her behavior is normal. Judgment and thought content normal.          Assessment & Plan:

## 2013-01-07 NOTE — Assessment & Plan Note (Signed)
Chronic trace BLE noted. Will restart HCTZ 12.5mg  daily, as symptoms were better controlled on this medication

## 2013-01-08 ENCOUNTER — Ambulatory Visit: Payer: Medicare Other | Admitting: *Deleted

## 2013-01-08 LAB — COMPREHENSIVE METABOLIC PANEL
ALT: 24 U/L (ref 0–35)
AST: 22 U/L (ref 0–37)
Alkaline Phosphatase: 64 U/L (ref 39–117)
CO2: 31 mEq/L (ref 19–32)
Creatinine, Ser: 0.7 mg/dL (ref 0.4–1.2)
GFR: 96.95 mL/min (ref >60.00)
Sodium: 139 mEq/L (ref 135–145)
Total Bilirubin: 0.5 mg/dL (ref 0.3–1.2)
Total Protein: 7.4 g/dL (ref 6.0–8.3)

## 2013-01-10 ENCOUNTER — Encounter: Payer: Self-pay | Admitting: Internal Medicine

## 2013-01-12 ENCOUNTER — Telehealth: Payer: Self-pay | Admitting: *Deleted

## 2013-01-12 MED ORDER — FINGERSTIX LANCETS MISC: 1.0000 | Freq: Two times a day (BID) | Status: DC

## 2013-01-12 MED ORDER — GLUCOSE BLOOD VI STRP: ORAL_STRIP | Status: DC

## 2013-01-12 NOTE — Telephone Encounter (Signed)
Prescriptions faxed to pharmacy.  

## 2013-01-15 ENCOUNTER — Ambulatory Visit (INDEPENDENT_AMBULATORY_CARE_PROVIDER_SITE_OTHER): Payer: Medicare Other | Admitting: Internal Medicine

## 2013-01-15 VITALS — BP 120/70 | HR 72 | Ht 65.0 in | Wt 201.0 lb

## 2013-01-15 DIAGNOSIS — I2589 Other forms of chronic ischemic heart disease: Secondary | ICD-10-CM

## 2013-01-15 DIAGNOSIS — I255 Ischemic cardiomyopathy: Secondary | ICD-10-CM

## 2013-01-15 LAB — BASIC METABOLIC PANEL
BUN: 14 mg/dL (ref 6–23)
Calcium: 10.1 mg/dL (ref 8.4–10.5)
Chloride: 97 mEq/L (ref 96–112)
Creatinine, Ser: 0.7 mg/dL (ref 0.4–1.2)

## 2013-01-15 LAB — CBC WITH DIFFERENTIAL/PLATELET
Basophils Absolute: 0 10*3/uL (ref 0.0–0.1)
Basophils Relative: 0.3 % (ref 0.0–3.0)
Eosinophils Absolute: 0.2 10*3/uL (ref 0.0–0.7)
HCT: 40.3 % (ref 36.0–46.0)
Hemoglobin: 13.8 g/dL (ref 12.0–15.0)
Lymphs Abs: 2.6 10*3/uL (ref 0.7–4.0)
MCHC: 34.3 g/dL (ref 30.0–36.0)
MCV: 94.6 fl (ref 78.0–100.0)
Monocytes Absolute: 0.7 10*3/uL (ref 0.1–1.0)
Neutro Abs: 5.1 10*3/uL (ref 1.4–7.7)
RBC: 4.26 Mil/uL (ref 3.87–5.11)
RDW: 12.5 % (ref 11.5–14.6)

## 2013-01-15 LAB — PROTIME-INR
INR: 1.1 ratio — ABNORMAL HIGH (ref 0.8–1.0)
Prothrombin Time: 11.7 s (ref 10.2–12.4)

## 2013-01-15 NOTE — Patient Instructions (Addendum)
PRE-CATH LABS TODAY:  Cbc, bmet, pt/inr, ptt

## 2013-01-15 NOTE — Progress Notes (Signed)
HPI Betty [name] is a 67 yo with a history of NICM  I saw her in November 2013.   SHe is also followed by Dr McQuaid.  She has a history of SOB and Sleep Apnea. He has requested a R heart cath to define pressures/O2 sats given continued SOB out of proportion to PFT abnormalities.   Patient say she is breathing some better from using CPAP but wit weather getting warmer feels it getting more short. She denies CP Allergies  Allergen Reactions  . Amoxicillin     REACTION: RASH  . Sulfonamide Derivatives     REACTION: RASH    Current Outpatient Prescriptions  Medication Sig Dispense Refill  . aspirin 81 MG EC tablet Take 81 mg by mouth daily.        . Calcium Citrate-Vitamin D 200-125 MG-UNIT TABS Take by mouth daily.        . carvedilol (COREG) 25 MG tablet Take 1 tablet (25 mg total) by mouth 2 (two) times daily.  180 tablet  3  . Cholecalciferol (VITAMIN D3) 1000 UNITS tablet Take 1,000 Units by mouth daily.        . Coenzyme Q10 200 MG capsule Take 200 mg by mouth daily.        . colchicine 0.6 MG tablet Take 0.6 mg by mouth. 1 two times a day to three times a day as needed for gout       . dextromethorphan (DELSYM) 30 MG/5ML liquid Take 10 mLs (60 mg total) by mouth as needed.  89 mL    . dextromethorphan-guaiFENesin (MUCINEX DM) 30-600 MG per 12 hr tablet Take 1 tablet by mouth as needed.      . Fingerstix Lancets MISC 1 each by Does not apply route 2 (two) times daily.  200 each  6  . glucose blood (BAYER CONTOUR NEXT TEST) test strip Use as instructed  100 each  12  . hydrochlorothiazide (MICROZIDE) 12.5 MG capsule Take 1 capsule (12.5 mg total) by mouth daily.  90 capsule  3  . losartan (COZAAR) 50 MG tablet One half tab every day.  90 tablet  3  . mometasone (NASONEX) 50 MCG/ACT nasal spray Place 2 sprays into the nose daily.  17 g  2  . multivitamin (THERAGRAN) per tablet Take 1 tablet by mouth daily.       . Omega-3 Fatty Acids (FISH OIL) 1000 MG CPDR Take 1,200 mg by mouth daily.        . potassium chloride (K-DUR,KLOR-CON) 10 MEQ tablet Take 1 tablet (10 mEq total) by mouth daily.  90 tablet  3  . simvastatin (ZOCOR) 20 MG tablet Take 1 tablet (20 mg total) by mouth daily.  90 tablet  1   No current facility-administered medications for this visit.    Past Medical History  Diagnosis Date  . Hyperlipidemia   . Gout   . NICM (nonischemic cardiomyopathy) 2002    EF 25%; improved to normal - echo 4/08: EF 50-55%, mild MR, mild LAE, mild TR;    cath 3/03: normal cors, EF 40%  . Glucose intolerance (impaired glucose tolerance)   . History of chicken pox   . Allergic rhinitis     never tested, fall and spring    Past Surgical History  Procedure Laterality Date  . Nsvd      x 2  . Cystectomy  1996    l breast  . Choleystectomy  02/2002  . Vaginal delivery        x2  . Tubal ligation  1975  . Dilation and curettage of uterus  2011    [facility]  . Induced abortion    . Breast biopsy  80's    Cyst removed    Family History  Problem Relation Age of Onset  . Lymphoma Mother   . Cancer Mother 39    Lymphoma  . COPD Father     was a smoker  . Stroke Father   . Pneumonia Father   . Diabetes Father   . Hyperlipidemia Father   . Heart disease Father   . Diabetes Sister   . Depression Sister   . Meniere's disease Brother   . Diabetes Paternal Grandfather   . Heart disease Paternal Grandfather   . Schizophrenia Daughter   . Cancer Maternal Grandmother     Bladder  . Cancer Maternal Grandfather     leukemia  . Colon cancer Neg Hx   . Stomach cancer Neg Hx     History   Social History  . Marital Status: Married    Spouse Name: N/A    Number of Children: 2  . Years of Education: N/A   Occupational History  . Retired Teacher   . Works at [employer] parttime    Social History Main Topics  . Smoking status: Never Smoker   . Smokeless tobacco: Never Used  . Alcohol Use: No  . Drug Use: No  . Sexually Active: No   Other Topics  Concern  . Not on file   Social History Narrative   Lives in [place] with husband. Worked at teacher at [employer]. 2 children. No pets.    Review of Systems:  All systems reviewed.  They are negative to the above problem except as previously stated.  Vital Signs: BP 120/70  Pulse 72  Ht 5' 5" (1.651 m)  Wt 201 lb (91.173 kg)  BMI 33.45 kg/m2  Physical Exam Patient is in NAD HEENT:  Normocephalic, atraumatic. EOMI, PERRLA.  Neck: JVP is normal.  No bruits.  Lungs: clear to auscultation. No rales no wheezes.  Heart: Regular rate and rhythm. Normal S1, S2. No S3.   No significant murmurs. PMI not displaced.  Abdomen:  Supple, nontender. Normal bowel sounds. No masses. No hepatomegaly.  Extremities:   Good distal pulses throughout. No lower extremity edema.  Musculoskeletal :moving all extremities.  Neuro:   alert and oriented x3.  CN II-XII grossly intact.   Assessment and Plan:  1.  Dyspnea  Symptoms out of proportion to PFTs and LV funciton.  Plan for R heart cath next wk.  Pre cath labs today.   2.  NICM  Normalization of EF on recent echo  Keep on same regimen.   3.  Sleep apnea  Continue CPAP    

## 2013-01-21 ENCOUNTER — Inpatient Hospital Stay (HOSPITAL_BASED_OUTPATIENT_CLINIC_OR_DEPARTMENT_OTHER)
Admission: RE | Admit: 2013-01-21 | Discharge: 2013-01-21 | Disposition: A | Discharge status: Home / Self Care | Payer: Medicare Other | Source: Ambulatory Visit | Attending: Cardiovascular Disease | Admitting: Cardiovascular Disease

## 2013-01-21 ENCOUNTER — Encounter (HOSPITAL_BASED_OUTPATIENT_CLINIC_OR_DEPARTMENT_OTHER)
Admission: RE | Disposition: A | Discharge status: Home / Self Care | Payer: Self-pay | Source: Ambulatory Visit | Attending: Cardiovascular Disease

## 2013-01-21 ENCOUNTER — Encounter: Payer: Self-pay | Admitting: Pulmonary Disease

## 2013-01-21 DIAGNOSIS — G4730 Sleep apnea, unspecified: Secondary | ICD-10-CM | POA: Insufficient documentation

## 2013-01-21 DIAGNOSIS — I279 Pulmonary heart disease, unspecified: Secondary | ICD-10-CM

## 2013-01-21 DIAGNOSIS — R0602 Shortness of breath: Secondary | ICD-10-CM | POA: Insufficient documentation

## 2013-01-21 DIAGNOSIS — I428 Other cardiomyopathies: Secondary | ICD-10-CM | POA: Insufficient documentation

## 2013-01-21 LAB — POCT I-STAT 3, VENOUS BLOOD GAS (G3P V)
Acid-Base Excess: 3 mmol/L — ABNORMAL HIGH (ref 0.0–2.0)
Acid-Base Excess: 4 mmol/L — ABNORMAL HIGH (ref 0.0–2.0)
Acid-Base Excess: 5 mmol/L — ABNORMAL HIGH (ref 0.0–2.0)
Acid-Base Excess: 5 mmol/L — ABNORMAL HIGH (ref 0.0–2.0)
Bicarbonate: 31.1 mEq/L — ABNORMAL HIGH (ref 20.0–24.0)
Bicarbonate: 32.7 mEq/L — ABNORMAL HIGH (ref 20.0–24.0)
O2 Saturation: 74 %
O2 Saturation: 75 %
O2 Saturation: 75 %
TCO2: 33 mmol/L (ref 0–100)
TCO2: 33 mmol/L (ref 0–100)
TCO2: 34 mmol/L (ref 0–100)
TCO2: 35 mmol/L (ref 0–100)
TCO2: 36 mmol/L (ref 0–100)
pCO2, Ven: 59.9 mmHg — ABNORMAL HIGH (ref 45.0–50.0)
pCO2, Ven: 62.5 mmHg — ABNORMAL HIGH (ref 45.0–50.0)
pH, Ven: 7.34 — ABNORMAL HIGH (ref 7.250–7.300)
pH, Ven: 7.345 — ABNORMAL HIGH (ref 7.250–7.300)
pO2, Ven: 43 mmHg (ref 30.0–45.0)
pO2, Ven: 44 mmHg (ref 30.0–45.0)
pO2, Ven: 44 mmHg (ref 30.0–45.0)

## 2013-01-21 LAB — HM PAP SMEAR: HM PAP: NEGATIVE

## 2013-01-21 SURGERY — JV RIGHT HEART CATHETERIZATION

## 2013-01-21 MED ORDER — SODIUM CHLORIDE 0.9 % IV SOLN: INTRAVENOUS | Status: AC

## 2013-01-21 MED ORDER — ACETAMINOPHEN 325 MG PO TABS: 650.0000 mg | ORAL_TABLET | ORAL | Status: DC | PRN

## 2013-01-21 NOTE — OR Nursing (Signed)
Dr Kirke Corin at bedside to discuss results and treatment plan with pt and family

## 2013-01-21 NOTE — OR Nursing (Signed)
Discharge instructions reviewed and signed, pt stated understanding, ambulated in hall without difficulty, site level 0, transported to husband's car via wheelchair 

## 2013-01-21 NOTE — OR Nursing (Signed)
Meal served 

## 2013-01-21 NOTE — CV Procedure (Signed)
   Cardiac Catheterization Procedure Note  Name: Betty [name] MRN: [id number] DOB: 01-06-1946  Procedure: Right Heart Cath  Indication: Dyspnea, possible pulmonary hypertension.    Procedural Details: The right groin was prepped, draped, and anesthetized with 1% lidocaine. Using the modified Seldinger technique a 7 French sheath was placed in the right femoral vein. A Swan-Ganz catheter was used for the right heart catheterization. Standard protocol was followed for recording of right heart pressures and sampling of oxygen saturations with saturation run. Fick cardiac output was calculated.  There were no immediate procedural complications. The patient was transferred to the post catheterization recovery area for further monitoring.  Procedural Findings: Hemodynamics  RA 9 mmHg RV 32/8 mmHg PA 30/16/23  mmHg PCWP 12 mmHg Normal wave forms.    Oxygen saturations: (done with 2 L [place]) PA 73 % AO 98% RV 74% RA 75%  SVC 75% IVC 83%   Cardiac Output (Fick) 5.3  Cardiac Index (Fick) 2.7 PVR: 2.1     Final Conclusions:   1. Normal filling pressure and cardiac output.  2. Normal pulmonary pressure (mean 23 mm Hg) with mildly elevated pulmonary vascular resistance (2.1 Wood units) 3. No evidence of left to right shunt by saturation run.   Recommendations:  Continue medical therapy.   Lorine Bears MD, [facility] 01/21/2013, 9:11 AM

## 2013-01-21 NOTE — Interval H&P Note (Signed)
History and Physical Interval Note:  01/21/2013 8:42 AM  Betty [name]  has presented today for surgery, with the diagnosis of Hypoxemia  The various methods of treatment have been discussed with the patient and family. After consideration of risks, benefits and other options for treatment, the patient has consented to  Procedure(s): JV RIGHT HEART CATHETERIZATION (N/A) as a surgical intervention .  The patient's history has been reviewed, patient examined, no change in status, stable for surgery.  I have reviewed the patient's chart and labs.  Questions were answered to the patient's satisfaction.     Lorine Bears

## 2013-01-21 NOTE — OR Nursing (Signed)
Tegaderm dressing applied, site level 0, bedrest began at 0920

## 2013-01-21 NOTE — H&P (View-Only) (Signed)
HPI Betty [name] is a 67 yo with a history of NICM  I saw her in November 2013.   SHe is also followed by Dr Kendrick Fries.  She has a history of SOB and Sleep Apnea. He has requested a R heart cath to define pressures/O2 sats given continued SOB out of proportion to PFT abnormalities.   Patient say she is breathing some better from using CPAP but wit weather getting warmer feels it getting more short. She denies CP Allergies  Allergen Reactions  . Amoxicillin     REACTION: RASH  . Sulfonamide Derivatives     REACTION: RASH    Current Outpatient Prescriptions  Medication Sig Dispense Refill  . aspirin 81 MG EC tablet Take 81 mg by mouth daily.        . Calcium Citrate-Vitamin D 200-125 MG-UNIT TABS Take by mouth daily.        . carvedilol (COREG) 25 MG tablet Take 1 tablet (25 mg total) by mouth 2 (two) times daily.  180 tablet  3  . Cholecalciferol (VITAMIN D3) 1000 UNITS tablet Take 1,000 Units by mouth daily.        . Coenzyme Q10 200 MG capsule Take 200 mg by mouth daily.        . colchicine 0.6 MG tablet Take 0.6 mg by mouth. 1 two times a day to three times a day as needed for gout       . dextromethorphan (DELSYM) 30 MG/5ML liquid Take 10 mLs (60 mg total) by mouth as needed.  89 mL    . dextromethorphan-guaiFENesin (MUCINEX DM) 30-600 MG per 12 hr tablet Take 1 tablet by mouth as needed.      . Fingerstix Lancets MISC 1 each by Does not apply route 2 (two) times daily.  200 each  6  . glucose blood (BAYER CONTOUR NEXT TEST) test strip Use as instructed  100 each  12  . hydrochlorothiazide (MICROZIDE) 12.5 MG capsule Take 1 capsule (12.5 mg total) by mouth daily.  90 capsule  3  . losartan (COZAAR) 50 MG tablet One half tab every day.  90 tablet  3  . mometasone (NASONEX) 50 MCG/ACT nasal spray Place 2 sprays into the nose daily.  17 g  2  . multivitamin (THERAGRAN) per tablet Take 1 tablet by mouth daily.       . Omega-3 Fatty Acids (FISH OIL) 1000 MG CPDR Take 1,200 mg by mouth daily.        . potassium chloride (K-DUR,KLOR-CON) 10 MEQ tablet Take 1 tablet (10 mEq total) by mouth daily.  90 tablet  3  . simvastatin (ZOCOR) 20 MG tablet Take 1 tablet (20 mg total) by mouth daily.  90 tablet  1   No current facility-administered medications for this visit.    Past Medical History  Diagnosis Date  . Hyperlipidemia   . Gout   . NICM (nonischemic cardiomyopathy) 2002    EF 25%; improved to normal - echo 4/08: EF 50-55%, mild MR, mild LAE, mild TR;    cath 3/03: normal cors, EF 40%  . Glucose intolerance (impaired glucose tolerance)   . History of chicken pox   . Allergic rhinitis     never tested, fall and spring    Past Surgical History  Procedure Laterality Date  . Nsvd      x 2  . Cystectomy  1996    l breast  . Choleystectomy  02/2002  . Vaginal delivery  x2  . Tubal ligation  1975  . Dilation and curettage of uterus  2011    [facility] Mar[facility]  . Induced abortion    . Breast biopsy  80's    Cyst removed    Family History  Problem Relation Age of Onset  . Lymphoma Mother   . Cancer Mother 103    Lymphoma  . COPD Father     was a smoker  . Stroke Father   . Pneumonia Father   . Diabetes Father   . Hyperlipidemia Father   . Heart disease Father   . Diabetes Sister   . Depression Sister   . Meniere's disease Brother   . Diabetes Paternal Grandfather   . Heart disease Paternal Grandfather   . Schizophrenia Daughter   . Cancer Maternal Grandmother     Bladder  . Cancer Maternal Grandfather     leukemia  . Colon cancer Neg Hx   . Stomach cancer Neg Hx     History   Social History  . Marital Status: Married    Spouse Name: N/A    Number of Children: 2  . Years of Education: N/A   Occupational History  . Retired Runner, broadcasting/film/video   . Works at [employer] History Main Topics  . Smoking status: Never Smoker   . Smokeless tobacco: Never Used  . Alcohol Use: No  . Drug Use: No  . Sexually Active: No   Other Topics  Concern  . Not on file   Social History Narrative   Lives in [place] with husband. Worked at Runner, broadcasting/film/video at [employer]. 2 children. No pets.    Review of Systems:  All systems reviewed.  They are negative to the above problem except as previously stated.  Vital Signs: BP 120/70  Pulse 72  Ht 5\' 5"  (1.651 m)  Wt 201 lb (91.173 kg)  BMI 33.45 kg/m2  Physical Exam Patient is in NAD HEENT:  Normocephalic, atraumatic. EOMI, PERRLA.  Neck: JVP is normal.  No bruits.  Lungs: clear to auscultation. No rales no wheezes.  Heart: Regular rate and rhythm. Normal S1, S2. No S3.   No significant murmurs. PMI not displaced.  Abdomen:  Supple, nontender. Normal bowel sounds. No masses. No hepatomegaly.  Extremities:   Good distal pulses throughout. No lower extremity edema.  Musculoskeletal :moving all extremities.  Neuro:   alert and oriented x3.  CN II-XII grossly intact.   Assessment and Plan:  1.  Dyspnea  Symptoms out of proportion to PFTs and LV funciton.  Plan for R heart cath next wk.  Pre cath labs today.   2.  NICM  Normalization of EF on recent echo  Keep on same regimen.   3.  Sleep apnea  Continue CPAP

## 2013-01-27 ENCOUNTER — Ambulatory Visit (INDEPENDENT_AMBULATORY_CARE_PROVIDER_SITE_OTHER): Payer: Medicare Other | Admitting: Pulmonary Disease

## 2013-01-27 ENCOUNTER — Encounter: Payer: Self-pay | Admitting: Pulmonary Disease

## 2013-01-27 VITALS — BP 110/62 | HR 84 | Temp 98.4°F | Ht 65.0 in | Wt 201.0 lb

## 2013-01-27 DIAGNOSIS — J301 Allergic rhinitis due to pollen: Secondary | ICD-10-CM

## 2013-01-27 DIAGNOSIS — J849 Interstitial pulmonary disease, unspecified: Secondary | ICD-10-CM

## 2013-01-27 DIAGNOSIS — R0989 Other specified symptoms and signs involving the circulatory and respiratory systems: Secondary | ICD-10-CM

## 2013-01-27 DIAGNOSIS — J9610 Chronic respiratory failure, unspecified whether with hypoxia or hypercapnia: Secondary | ICD-10-CM

## 2013-01-27 DIAGNOSIS — R0609 Other forms of dyspnea: Secondary | ICD-10-CM

## 2013-01-27 DIAGNOSIS — J8410 Pulmonary fibrosis, unspecified: Secondary | ICD-10-CM

## 2013-01-27 DIAGNOSIS — R0600 Dyspnea, unspecified: Secondary | ICD-10-CM

## 2013-01-27 DIAGNOSIS — G4733 Obstructive sleep apnea (adult) (pediatric): Secondary | ICD-10-CM

## 2013-01-27 DIAGNOSIS — J9611 Chronic respiratory failure with hypoxia: Secondary | ICD-10-CM

## 2013-01-27 NOTE — Progress Notes (Signed)
Subjective:    Patient ID: Betty [name], female    DOB: 12-Mar-1946, 67 y.o.   MRN: [id number]  Synopsis: Betty [name] is a very pleasant female who first saw the [facility] pulmonary clinic in December 2013 for evaluation of shortness of breath. She is a lifetime nonsmoker. She developed increasing shortness of breath over the course of 2 years about 2011 to 2013. Pulmonary function testing showed restrictive lung disease with a markedly depressed DLCO at 35% predicted. CT scanning of her chest showed upper lobe groundglass abnormalities versus air trapping.  A home polysomnogram showed an AHI of 12 and multiple desaturation events to 80% independent of apneas.  An abg showed resting hypercarbia (pCO2 52), and a MIP/MEP was 36%/28% predicted.  A follow up diaphragm study was normal.  A repeat CT scan was performed, no pulmonary embolism was seen and radiology commented on atelectasis but no clear intersitial lung disease.  Blood work including an HP panel and serologies for connective tissue diseases has been essentially negative (two partial aspergillus bands on HP panel).  Two echocardiograms have not shown evidence of pulmonary hypertension.  After starting on CPAP with nasal pillows and oxygen with exertion and sleep she started feeling better.  A right heart catheterization performed 01/21/2013 did not show pulmonary hypertension.  HPI   10/21/2012 routine office visit--Betty [name] it says that she feels about the same since the last visit. Her husband is with her today. He states that there has been a clear change in her ability to exercise in the last 8 months. She can now "barely walk to the car without getting short of breath". She has not had a change in her mild dry cough. She does not have chest pain she has not had increasing leg swelling. She tells me that during the polysomnogram she did not sleep. There have been no changes to her medications since the last visit.  11/11/2012 ROV >  Betty [name] says that she has been doing the same since the last visit. She still has fatigue and shortness of breath. She denies significant cough and denies sputum production. She has no new pain or swelling anywhere since the last visit. She is supposed to have CPAP set up in her home on Thursday of this week. Today in the office she desaturated with walking.    11/25/12 ROV > Betty [name] says that she is feeling better and has more energy since using CPAP and oxygen regularly.  She states that her thinking is more clear than before.  She can walk further than before and overall feels better.  She has been having some sinus congestion with her CPAP at night which she is using with nasal pillows.  She is using CPAP every night.  She does not have leg swelling or chest pain.  She has not been cleaning her CPAP machine.    01/27/2013 ROV >> Betty [name] about the same since the last visit. She has more shortness of breath when she is going out and about in the heat. She has noticed itching eyes and scratchy throat in the last few weeks. She currently does not take any medication for allergies. She continues to use her oxygen with exertion and at sleep. She uses her CPAP every night. She's not had chest pain or leg swelling. She does not have cough.    Past Medical History  Diagnosis Date  . Hyperlipidemia   . Gout   . NICM (nonischemic cardiomyopathy) 2002    EF 25%; improved  to normal - echo 4/08: EF 50-55%, mild MR, mild LAE, mild TR;    cath 3/03: normal cors, EF 40%  . Glucose intolerance (impaired glucose tolerance)   . History of chicken pox   . Allergic rhinitis     never tested, fall and spring    Review of Systems  Constitutional: Negative for fever, chills and fatigue.  HENT: Negative for congestion, rhinorrhea and postnasal drip.   Respiratory: Positive for shortness of breath. Negative for cough, wheezing and stridor.   Cardiovascular: Negative for chest pain, palpitations and leg swelling.        Objective:   Physical Exam  Filed Vitals:   01/27/13 1111  BP: 110/62  Pulse: 84  Temp: 98.4 F (36.9 C)  TempSrc: Oral  Height: 5\' 5"  (1.651 m)  Weight: 201 lb (91.173 kg)  SpO2: 90%  RA   Gen: well appearing, no acute distress HEENT: NCAT, EOMi, OP clear,  PULM: CTA B CV: RRR, no mgr, no JVD AB: BS+, soft, nontender, no hsm Ext: warm, trace ankle edema, no clubbing, no cyanosis Neuro: A&Ox4, CN II-XII intact, strength 5/5 in all 4 extremities   06/2012 Echo>> grade 1 diastolic dysfunction, LVEF 55%, RV/RA normal, triscupid regurge jet velocity WNL 08/2012 PFT [facility] >> Ratio normal, Flow volume loop normal; FVC 1.46 L (48% pred), TLC 2.77 L (55% pred) ERV 0.03 (3% pred), DLCO 8.4 (35% pred) 08/2012 ABG >> 7.39/52/57/31.5/92% 10/01/2012 PSG>> poor sleep efficiency; AHI < 5; desaturated as low as 82% 09/2012 PFT [facility] >> unchanged from 08/2012 study. 10/20/2012 MIP/MEP >> 36 (36% pred)/-20 (28% pred) 09/2012 serology>> ANA neg, Anti-SCL70 neg, SSA/SSB neg/neg, Anti-Jo-1 neg, Aldolase 5, CRP 5, ESR 30, RF 31 10/24/2012 Sniff test >> normal diaphragm movement 09/2012 Home PSG >> AHI 12, several desaturation events independent of apnea 09/2012 Hypersensitivity Pneumonitis profile> Asp flav pos/asp nig partial; all else negative 12/09/2012 >> 950 feet on 2L [place] 12/16/2012 V/Q scan >> multiple areas of matched decreased ventilation and perfusion uptake, more in LUL 01/21/2013 [facility] >> RA 9 mmHg, RV 32/8 mmHg, PA 30/16/23 mmHg, PCWP 12 mmHg, Normal wave forms. Oxygen saturations: (done with 2 L [place]), PA 73 %, AO 98%, RV 74%, RA 75%, SVC 75%, IVC 83%; Cardiac Output (Fick) 5.3, Cardiac Index (Fick) 2.7, PVR: 2.1        Assessment & Plan:   Dyspnea Betty [name]'s workup has been comprehensive and to date the most clear objective abnormalities include hypoxemia at rest and with exertion, moderate restriction on PFTs, a DLCO of 35% predicted, decreased maximum inspiratory and expiratory pressure,  polysomnogram showing obstructive sleep apnea, and areas of matched decreased ventilation and perfusion uptake bilaterally.  On CT scan imaging, I have seen mosaicism and air trapping. Notably, radiology has not called this abnormality but on my review of multiple images with pulmonary colleagues we clearly feel that there is an abnormality.  At this point, I think the only way for [place] to get an answer for why she has hypoxemic respiratory failure is to obtain a VATS biopsy. I explained to her that I think the most likely diagnosis is a bronchiolitis. We could pursue empiric treatment, however she prefers to have a diagnosis. We discussed the possibility of obtaining a second opinion at [facility] versus going straight to open lung biopsy and she prefers to be seen at [facility] first because if she needs surgery she prefers to have it there.  Plan: -Referral to the [facility]  interstitial lung disease clinic with Dr. Jon Billings  Chronic hypoxemic respiratory failure Continue 2 L with exertion and at night with CPAP.  Obstructive sleep apnea Continue CPAP as written.  ALLERGIC RHINITIS, SEASONAL Start cetirizine and OTC Nasacort   or  Updated Medication List Outpatient Encounter Prescriptions as of 01/27/2013  Medication Sig Dispense Refill  . aspirin 81 MG EC tablet Take 81 mg by mouth daily.        . Calcium Citrate-Vitamin D 200-125 MG-UNIT TABS Take by mouth daily.        . carvedilol (COREG) 25 MG tablet Take 1 tablet (25 mg total) by mouth 2 (two) times daily.  180 tablet  3  . Cholecalciferol (VITAMIN D3) 1000 UNITS tablet Take 1,000 Units by mouth daily.        . Coenzyme Q10 200 MG capsule Take 200 mg by mouth daily.        . colchicine 0.6 MG tablet Take 0.6 mg by mouth. 1 two times a day to three times a day as needed for gout       . dextromethorphan (DELSYM) 30 MG/5ML liquid Take 10 mLs (60 mg total) by mouth as needed.  89 mL    . dextromethorphan-guaiFENesin  (MUCINEX DM) 30-600 MG per 12 hr tablet Take 1 tablet by mouth as needed.      . Fingerstix Lancets MISC 1 each by Does not apply route 2 (two) times daily.  200 each  6  . glucose blood (BAYER CONTOUR NEXT TEST) test strip Use as instructed  100 each  12  . hydrochlorothiazide (MICROZIDE) 12.5 MG capsule Take 1 capsule (12.5 mg total) by mouth daily.  90 capsule  3  . losartan (COZAAR) 50 MG tablet One half tab every day.  90 tablet  3  . mometasone (NASONEX) 50 MCG/ACT nasal spray Place 2 sprays into the nose daily.  17 g  2  . multivitamin (THERAGRAN) per tablet Take 1 tablet by mouth daily.       . Omega-3 Fatty Acids (FISH OIL) 1000 MG CPDR Take 1,200 mg by mouth daily.       . potassium chloride (K-DUR,KLOR-CON) 10 MEQ tablet Take 1 tablet (10 mEq total) by mouth daily.  90 tablet  3  . simvastatin (ZOCOR) 20 MG tablet Take 1 tablet (20 mg total) by mouth daily.  90 tablet  1   No facility-administered encounter medications on file as of 01/27/2013.

## 2013-01-27 NOTE — Patient Instructions (Signed)
We will refer you to the [facility] and will follow up the report from them  We will see you back after that visit  Keep using your oxygen as you are doing

## 2013-01-27 NOTE — Assessment & Plan Note (Addendum)
Betty [name]'s workup has been comprehensive and to date the most clear objective abnormalities include hypoxemia at rest and with exertion, moderate restriction on PFTs, a DLCO of 35% predicted, decreased maximum inspiratory and expiratory pressure, polysomnogram showing obstructive sleep apnea, and areas of matched decreased ventilation and perfusion uptake bilaterally.  On CT scan imaging, I have seen mosaicism and air trapping. Notably, radiology has not called this abnormality but on my review of multiple images with pulmonary colleagues we clearly feel that there is an abnormality.  At this point, I think the only way for [place] to get an answer for why she has hypoxemic respiratory failure is to obtain a VATS biopsy. I explained to her that I think the most likely diagnosis is a bronchiolitis. We could pursue empiric treatment, however she prefers to have a diagnosis. We discussed the possibility of obtaining a second opinion at [facility] versus going straight to open lung biopsy and she prefers to be seen at [facility] first because if she needs surgery she prefers to have it there.  Plan: -Referral to the [facility] interstitial lung disease clinic with Dr. Jon Billings

## 2013-01-27 NOTE — Assessment & Plan Note (Signed)
Continue 2 L with exertion and at night with CPAP.

## 2013-01-27 NOTE — Assessment & Plan Note (Signed)
Start cetirizine and OTC Nasacort

## 2013-01-27 NOTE — Assessment & Plan Note (Signed)
Continue CPAP as written. 

## 2013-01-29 ENCOUNTER — Telehealth: Payer: Self-pay | Admitting: Pulmonary Disease

## 2013-01-29 NOTE — Telephone Encounter (Signed)
I called Melissa and she will look this up. She stated she can print the note off and send over to her team. I called pt but NA and unable to leave [facility]

## 2013-01-30 NOTE — Telephone Encounter (Signed)
Spoke with patient-aware that [facility] can get OV notes from our EPIC system showing she has been seen and she should just keep 04-2013 appt as BQ requested. If any other questions or concerns to please call our office. Nothing more needed at this time. Will sign off on message.

## 2013-02-11 ENCOUNTER — Encounter: Payer: Self-pay | Admitting: Physician Assistant

## 2013-02-11 ENCOUNTER — Ambulatory Visit (INDEPENDENT_AMBULATORY_CARE_PROVIDER_SITE_OTHER): Payer: Medicare Other | Admitting: Physician Assistant

## 2013-02-11 VITALS — BP 104/78 | HR 84 | Wt 201.8 lb

## 2013-02-11 DIAGNOSIS — R0600 Dyspnea, unspecified: Secondary | ICD-10-CM

## 2013-02-11 DIAGNOSIS — I428 Other cardiomyopathies: Secondary | ICD-10-CM

## 2013-02-11 NOTE — Patient Instructions (Addendum)
The current medical regimen is effective;  continue present plan and medications.  Follow up in the fall as instructed.

## 2013-02-11 NOTE — Progress Notes (Signed)
HPI:  This is a 66 year old female patient who recently underwent right heart catheterization to rule out pulmonary hypertension because of chronic hypoxia and dyspnea. Right heart catheterization was normal. She now is being referred to [facility] for a second opinion for her dyspnea.   She has a history of an nonischemic cardiomyopathy. Cath in 2003 revealed normal coronary arteries ejection fraction 25%. Repeat 2-D echo 3/14 ejection fraction was normal. Myoview scan in 11/13 was considered low risk with normal EF, no ischemia. She denies cardiac complaints.  Allergies:  -- Amoxicillin    --  REACTION: RASH  -- Sulfonamide Derivatives    --  REACTION: RASH  Current Outpatient Prescriptions on File Prior to Visit: aspirin 81 MG EC tablet, Take 81 mg by mouth daily.  , Disp: , Rfl:  Calcium Citrate-Vitamin D 200-125 MG-UNIT TABS, Take by mouth daily.  , Disp: , Rfl:  carvedilol (COREG) 25 MG tablet, Take 1 tablet (25 mg total) by mouth 2 (two) times daily., Disp: 180 tablet, Rfl: 3 Cholecalciferol (VITAMIN D3) 1000 UNITS tablet, Take 1,000 Units by mouth daily.  , Disp: , Rfl:  Coenzyme Q10 200 MG capsule, Take 200 mg by mouth daily.  , Disp: , Rfl:  colchicine 0.6 MG tablet, Take 0.6 mg by mouth. 1 two times a day to three times a day as needed for gout , Disp: , Rfl:  dextromethorphan (DELSYM) 30 MG/5ML liquid, Take 10 mLs (60 mg total) by mouth as needed., Disp: 89 mL, Rfl:  dextromethorphan-guaiFENesin (MUCINEX DM) 30-600 MG per 12 hr tablet, Take 1 tablet by mouth as needed., Disp: , Rfl:  losartan (COZAAR) 50 MG tablet, One half tab every day., Disp: 90 tablet, Rfl: 3 mometasone (NASONEX) 50 MCG/ACT nasal spray, Place 2 sprays into the nose daily., Disp: 17 g, Rfl: 2 multivitamin (THERAGRAN) per tablet, Take 1 tablet by mouth daily. , Disp: , Rfl:  Omega-3 Fatty Acids (FISH OIL) 1000 MG CPDR, Take 1,200 mg by mouth daily. , Disp: , Rfl:  potassium chloride (K-DUR,KLOR-CON) 10 MEQ  tablet, Take 1 tablet (10 mEq total) by mouth daily., Disp: 90 tablet, Rfl: 3 simvastatin (ZOCOR) 20 MG tablet, Take 1 tablet (20 mg total) by mouth daily., Disp: 90 tablet, Rfl: 1  No current facility-administered medications on file prior to visit.   Past Medical History:   Hyperlipidemia                                               Gout                                                         NICM (nonischemic cardiomyopathy)               2002           Comment:EF 25%; improved to normal - echo 4/08: EF               50-55%, mild MR, mild LAE, mild TR;    cath               3/03: normal cors, EF 40%   Glucose intolerance (impaired glucose toleranc*  History of chicken pox                                       Allergic rhinitis                                              Comment:never tested, fall and spring  Past Surgical History:   nsvd                                                            Comment:x 2   CYSTECTOMY                                       1996           Comment:l breast   choleystectomy                                   02/2002       VAGINAL DELIVERY                                                Comment:x2   TUBAL LIGATION                                   1975         DILATION AND CURETTAGE OF UTERUS                 2011           Comment:Dr. Marice [facility] at [facility]   INDUCED ABORTION                                              BREAST BIOPSY                                    80's           Comment:Cyst removed  Review of patient's family history indicates:   Lymphoma                       Mother                   Cancer                         Mother                     Comment: Lymphoma   COPD  Father                     Comment: was a smoker   Stroke                         Father                   Pneumonia                      Father                   Diabetes                       Father                    Hyperlipidemia                 Father                   Heart disease                  Father                   Diabetes                       Sister                   Depression                     Sister                   Meniere's disease              Brother                  Diabetes                       Paternal Grandfather     Heart disease                  Paternal Grandfather     Schizophrenia                  Daughter                 Cancer                         Maternal Grandmother       Comment: Bladder   Cancer                         Maternal Grandfather       Comment: leukemia   Colon cancer                   Neg Hx                   Stomach cancer                 Neg Hx                   Social History   Marital Status: Married  Spouse Name:                      Years of Education:                 Number of children: 2           Occupational History Occupation          Psychiatric nurse              Retired Runner, broadcasting/film/video                          Works at [employer]                       Social History Main Topics   Smoking Status: Never Smoker                     Smokeless Status: Never Used                       Alcohol Use: No             Drug Use: No             Sexual Activity: No                 Other Topics            Concern   None on file  Social History Narrative   Lives in [place] with husband. Worked at Runner, broadcasting/film/video at [employer]. 2 children. No pets.    ROS: Hypoxia and dyspnea otherwise see history of present illness   PHYSICAL EXAM: Well-nournished, in no acute distress. Neck: No JVD, HJR, Bruit, or thyroid enlargement  Lungs: Decreased breath sounds throughout, No tachypnea, clear without wheezing, rales, or rhonchi  Cardiovascular: RRR, PMI not displaced, heart sounds distant, no murmurs, gallops, bruit, thrill, or heave.  Abdomen: BS normal. Soft without organomegaly, masses, lesions or tenderness.  Extremities:  Right groin without hematoma or hemorrhage at cath site, alert extremities without cyanosis, clubbing or edema. Good distal pulses bilateral  SKin: Warm, no lesions or rashes   Musculoskeletal: No deformities  Neuro: no focal signs  BP 104/78  Pulse 84  Wt 201 lb 12.8 oz (91.536 kg)  BMI 33.58 kg/m2

## 2013-02-11 NOTE — Assessment & Plan Note (Addendum)
No evidence of pulmonary hypertension and right heart catheterization. Patient to obtain second opinion on her dyspnea  at [facility] next week.

## 2013-02-11 NOTE — Assessment & Plan Note (Signed)
Ejection fraction normal on 2-D echo on 11/20/12

## 2013-04-09 ENCOUNTER — Encounter: Payer: Self-pay | Admitting: Internal Medicine

## 2013-04-09 ENCOUNTER — Ambulatory Visit (INDEPENDENT_AMBULATORY_CARE_PROVIDER_SITE_OTHER): Payer: Medicare Other | Admitting: Internal Medicine

## 2013-04-09 VITALS — BP 112/70 | HR 80 | Temp 98.3°F | Wt 199.0 lb

## 2013-04-09 DIAGNOSIS — R0609 Other forms of dyspnea: Secondary | ICD-10-CM

## 2013-04-09 DIAGNOSIS — E669 Obesity, unspecified: Secondary | ICD-10-CM | POA: Insufficient documentation

## 2013-04-09 DIAGNOSIS — E119 Type 2 diabetes mellitus without complications: Secondary | ICD-10-CM

## 2013-04-09 DIAGNOSIS — R0600 Dyspnea, unspecified: Secondary | ICD-10-CM

## 2013-04-09 DIAGNOSIS — R0989 Other specified symptoms and signs involving the circulatory and respiratory systems: Secondary | ICD-10-CM

## 2013-04-09 DIAGNOSIS — M109 Gout, unspecified: Secondary | ICD-10-CM

## 2013-04-09 LAB — COMPREHENSIVE METABOLIC PANEL
ALT: 23 U/L (ref 0–35)
AST: 22 U/L (ref 0–37)
Alkaline Phosphatase: 60 U/L (ref 39–117)
BUN: 14 mg/dL (ref 6–23)
Calcium: 9.6 mg/dL (ref 8.4–10.5)
Chloride: 102 mEq/L (ref 96–112)
Creatinine, Ser: 0.7 mg/dL (ref 0.4–1.2)
Total Bilirubin: 0.6 mg/dL (ref 0.3–1.2)

## 2013-04-09 MED ORDER — COLCHICINE 0.6 MG PO TABS: 0.6000 mg | ORAL_TABLET | Freq: Two times a day (BID) | ORAL | Status: DC | PRN

## 2013-04-09 NOTE — Assessment & Plan Note (Signed)
Recent gout flare after using hydrochlorothiazide. Will avoid hydrochlorothiazide in the future. Will continue colchicine as needed for gout flare.

## 2013-04-09 NOTE — Progress Notes (Signed)
Subjective:    Patient ID: Betty [name], female    DOB: 1946-03-16, 67 y.o.   MRN: [id number]  HPI 67 year old female with history of diabetes, hypertension, hyperlipidemia, obesity, chronic hypoxemic respiratory failure presents for followup. In regards to diabetes, she brings record of blood sugars which show most sugars between 80 and 100 fasting. She has not had any blood sugars greater than 200, only one sugar of 199. She has been trying to follow a low glycemic index diet. She is also trying to exercise by walking. However exercise is limited by respiratory issues.  She reports evaluation at [facility] is in progress for chronic hypoxemic respiratory failure. Recent pulmonary function testing and 6 minute walk test were normal. She is scheduled for followup later this month. She reports that symptoms of shortness of breath have been worsened recently with humid weather. She denies any cough, chest pain, fever, chills.  She's been trying to lose weight by following a healthier diet. She has lost 10 pounds by her scale over the last month.  Outpatient Encounter Prescriptions as of 04/09/2013  Medication Sig Dispense Refill  . aspirin 81 MG EC tablet Take 81 mg by mouth daily.        . Calcium Citrate-Vitamin D 200-125 MG-UNIT TABS Take by mouth daily.        . carvedilol (COREG) 25 MG tablet Take 1 tablet (25 mg total) by mouth 2 (two) times daily.  180 tablet  3  . Cholecalciferol (VITAMIN D3) 1000 UNITS tablet Take 1,000 Units by mouth daily.        . Coenzyme Q10 200 MG capsule Take 200 mg by mouth daily.        . colchicine 0.6 MG tablet Take 1 tablet (0.6 mg total) by mouth 2 (two) times daily as needed. 1 two times a day to three times a day as needed for gout  60 tablet  6  . dextromethorphan (DELSYM) 30 MG/5ML liquid Take 10 mLs (60 mg total) by mouth as needed.  89 mL    . dextromethorphan-guaiFENesin (MUCINEX DM) 30-600 MG per 12 hr tablet Take 1 tablet by mouth as needed.       Marland Kitchen losartan (COZAAR) 50 MG tablet One half tab every day.  90 tablet  3  . mometasone (NASONEX) 50 MCG/ACT nasal spray Place 2 sprays into the nose daily.  17 g  2  . multivitamin (THERAGRAN) per tablet Take 1 tablet by mouth daily.       . Omega-3 Fatty Acids (FISH OIL) 1000 MG CPDR Take 1,200 mg by mouth daily.       . potassium chloride (K-DUR,KLOR-CON) 10 MEQ tablet Take 1 tablet (10 mEq total) by mouth daily.  90 tablet  3  . simvastatin (ZOCOR) 20 MG tablet Take 1 tablet (20 mg total) by mouth daily.  90 tablet  1   No facility-administered encounter medications on file as of 04/09/2013.   BP 112/70  Pulse 80  Temp(Src) 98.3 F (36.8 C) (Oral)  Wt 199 lb (90.266 kg)  BMI 33.12 kg/m2  SpO2 91%  Review of Systems  Constitutional: Negative for fever, chills, appetite change, fatigue and unexpected weight change.  HENT: Negative for ear pain, congestion, sore throat, trouble swallowing, neck pain, voice change and sinus pressure.   Eyes: Negative for visual disturbance.  Respiratory: Positive for shortness of breath (chronic). Negative for cough, wheezing and stridor.   Cardiovascular: Negative for chest pain, palpitations and leg swelling.  Gastrointestinal: Negative for nausea, vomiting, abdominal pain, diarrhea, constipation, blood in stool, abdominal distention and anal bleeding.  Genitourinary: Negative for dysuria and flank pain.  Musculoskeletal: Negative for myalgias, arthralgias and gait problem.  Skin: Negative for color change and rash.  Neurological: Negative for dizziness and headaches.  Hematological: Negative for adenopathy. Does not bruise/bleed easily.  Psychiatric/Behavioral: Negative for suicidal ideas, sleep disturbance and dysphoric mood. The patient is not nervous/anxious.        Objective:   Physical Exam  Constitutional: She is oriented to person, place, and time. She appears well-developed and well-nourished. No distress.  HENT:  Head: Normocephalic  and atraumatic.  Right Ear: External ear normal.  Left Ear: External ear normal.  Nose: Nose normal.  Mouth/Throat: Oropharynx is clear and moist. No oropharyngeal exudate.  Eyes: Conjunctivae are normal. Pupils are equal, round, and reactive to light. Right eye exhibits no discharge. Left eye exhibits no discharge. No scleral icterus.  Neck: Normal range of motion. Neck supple. No tracheal deviation present. No thyromegaly present.  Cardiovascular: Normal rate, regular rhythm, normal heart sounds and intact distal pulses.  Exam reveals no gallop and no friction rub.   No murmur heard. Pulmonary/Chest: Effort normal and breath sounds normal. No accessory muscle usage. Not tachypneic. No respiratory distress. She has no decreased breath sounds. She has no wheezes. She has no rhonchi. She has no rales. She exhibits no tenderness.  Musculoskeletal: Normal range of motion. She exhibits no edema and no tenderness.  Lymphadenopathy:    She has no cervical adenopathy.  Neurological: She is alert and oriented to person, place, and time. No cranial nerve deficit. She exhibits normal muscle tone. Coordination normal.  Skin: Skin is warm and dry. No rash noted. She is not diaphoretic. No erythema. No pallor.  Psychiatric: She has a normal mood and affect. Her behavior is normal. Judgment and thought content normal.          Assessment & Plan:

## 2013-04-09 NOTE — Assessment & Plan Note (Signed)
Congratulated pt on weight loss. Encouraged continued effort at healthy diet and regular physical activity as tolerated with goal of continued weight loss.

## 2013-04-09 NOTE — Assessment & Plan Note (Signed)
Evaluation in process at [facility]. Will request recent notes.

## 2013-04-09 NOTE — Assessment & Plan Note (Signed)
Pt reports good control of BG. Will check A1c with labs today. Continue dietary control of BG. Follow up 3 months and prn.

## 2013-04-22 ENCOUNTER — Encounter: Payer: Self-pay | Admitting: Internal Medicine

## 2013-04-23 LAB — HM DIABETES EYE EXAM

## 2013-04-26 ENCOUNTER — Encounter: Payer: Self-pay | Admitting: Internal Medicine

## 2013-04-28 ENCOUNTER — Telehealth: Payer: Self-pay | Admitting: *Deleted

## 2013-04-28 NOTE — Telephone Encounter (Signed)
Spoke with pt She is scheduled to see [facility] for her second visit on 05/18/13 I have scheduled her ov with Dr Kendrick Fries for 05/26/13 at 9:45 am

## 2013-04-28 NOTE — Telephone Encounter (Signed)
Message copied by Christen Butter on Tue Apr 28, 2013  8:43 AM ------      Message from: Lupita Leash      Created: Sun Apr 26, 2013 10:16 PM       L,            Can we get her scheduled to see [place] again sometime soon?  She is supposed to go back to [facility] later this month, so even if she sees [place] in 2-3 months that is fine.            Thanks,      B ------

## 2013-05-11 ENCOUNTER — Other Ambulatory Visit: Payer: Self-pay | Admitting: Internal Medicine

## 2013-05-26 ENCOUNTER — Ambulatory Visit (INDEPENDENT_AMBULATORY_CARE_PROVIDER_SITE_OTHER): Payer: Medicare Other | Admitting: Pulmonary Disease

## 2013-05-26 ENCOUNTER — Encounter: Payer: Self-pay | Admitting: Pulmonary Disease

## 2013-05-26 VITALS — BP 106/62 | HR 70 | Ht 65.0 in | Wt 199.0 lb

## 2013-05-26 DIAGNOSIS — J9611 Chronic respiratory failure with hypoxia: Secondary | ICD-10-CM

## 2013-05-26 DIAGNOSIS — J9610 Chronic respiratory failure, unspecified whether with hypoxia or hypercapnia: Secondary | ICD-10-CM

## 2013-05-26 DIAGNOSIS — G4733 Obstructive sleep apnea (adult) (pediatric): Secondary | ICD-10-CM

## 2013-05-26 NOTE — Patient Instructions (Signed)
Follow up with Dr. Jon Billings and the lung biopsy as scheduled at [facility] We will see you back in 4-5 months or sooner if needed

## 2013-05-26 NOTE — Assessment & Plan Note (Addendum)
O2 saturation in the office today was 90% walking on room air.  Continue O2 at night.  Again, the etiology of this is uncertain.  She is to have an open lung biopsy in November 2014 at [facility].  See my previously notes for discussion.

## 2013-05-26 NOTE — Progress Notes (Signed)
Subjective:    Patient ID: Betty [name], female    DOB: 02/04/1946, 67 y.o.   MRN: [id number]  Synopsis: Betty [name] is a very pleasant female who first saw the [facility] pulmonary clinic in December 2013 for evaluation of shortness of breath. She is a lifetime nonsmoker. She developed increasing shortness of breath over the course of 2 years about 2011 to 2013. Pulmonary function testing showed restrictive lung disease with a markedly depressed DLCO at 35% predicted. CT scanning of her chest showed upper lobe groundglass abnormalities versus air trapping.  A home polysomnogram showed an AHI of 12 and multiple desaturation events to 80% independent of apneas.  An abg showed resting hypercarbia (pCO2 52), and a MIP/MEP was 36%/28% predicted.  A follow up diaphragm study was normal.  A repeat CT scan was performed, no pulmonary embolism was seen and radiology commented on atelectasis but no clear intersitial lung disease.  Blood work including an HP panel and serologies for connective tissue diseases has been essentially negative (two partial aspergillus bands on HP panel).  Two echocardiograms have not shown evidence of pulmonary hypertension.  After starting on CPAP with nasal pillows and oxygen with exertion and sleep she started feeling better.  A right heart catheterization performed 01/21/2013 did not show pulmonary hypertension.  HPI   10/21/2012 routine office visit--Betty [name] it says that she feels about the same since the last visit. Her husband is with her today. He states that there has been a clear change in her ability to exercise in the last 8 months. She can now "barely walk to the car without getting short of breath". She has not had a change in her mild dry cough. She does not have chest pain she has not had increasing leg swelling. She tells me that during the polysomnogram she did not sleep. There have been no changes to her medications since the last visit.  11/11/2012 ROV >  Betty [name] says that she has been doing the same since the last visit. She still has fatigue and shortness of breath. She denies significant cough and denies sputum production. She has no new pain or swelling anywhere since the last visit. She is supposed to have CPAP set up in her home on Thursday of this week. Today in the office she desaturated with walking.    11/25/12 ROV > Betty [name] says that she is feeling better and has more energy since using CPAP and oxygen regularly.  She states that her thinking is more clear than before.  She can walk further than before and overall feels better.  She has been having some sinus congestion with her CPAP at night which she is using with nasal pillows.  She is using CPAP every night.  She does not have leg swelling or chest pain.  She has not been cleaning her CPAP machine.    01/27/2013 ROV >> Betty [name] about the same since the last visit. She has more shortness of breath when she is going out and about in the heat. She has noticed itching eyes and scratchy throat in the last few weeks. She currently does not take any medication for allergies. She continues to use her oxygen with exertion and at sleep. She uses her CPAP every night. She's not had chest pain or leg swelling. She does not have cough.  05/26/2013 ROV >> She plans to have an open lung biopsy in November at [facility]. Her oxygen level has been OK, her O2 saturation has been OK.  She is using her oxygen at night with her CPAP machine. She thinks that her symptoms and fatigue is a little better since she first met, but in general she is not feeling like things "should be".  She has lost 11 pounds intentionally, but still has some fatigue.    Past Medical History  Diagnosis Date  . Hyperlipidemia   . Gout   . NICM (nonischemic cardiomyopathy) 2002    EF 25%; improved to normal - echo 4/08: EF 50-55%, mild MR, mild LAE, mild TR;    cath 3/03: normal cors, EF 40%  . Glucose intolerance (impaired glucose tolerance)    . History of chicken pox   . Allergic rhinitis     never tested, fall and spring    Review of Systems  Constitutional: Negative for fever, chills and fatigue.  HENT: Negative for congestion, rhinorrhea and postnasal drip.   Respiratory: Positive for shortness of breath. Negative for cough, wheezing and stridor.   Cardiovascular: Negative for chest pain, palpitations and leg swelling.       Objective:   Physical Exam  Filed Vitals:   05/26/13 0958  BP: 106/62  Pulse: 70  Height: 5\' 5"  (1.651 m)  Weight: 199 lb (90.266 kg)  SpO2: 91%  RA  O2 saturation dropped to 90% walking 500 feet   Gen: well appearing, no acute distress HEENT: NCAT, EOMi, OP clear,  PULM: Few mild inspiratory crackles upper lobes bilaterally CV: RRR, no mgr, no JVD AB: BS+, soft, nontender, no hsm Ext: warm, trace ankle edema, no clubbing, no cyanosis   06/2012 Echo>> grade 1 diastolic dysfunction, LVEF 55%, RV/RA normal, triscupid regurge jet velocity WNL 08/2012 PFT [facility] >> Ratio normal, Flow volume loop normal; FVC 1.46 L (48% pred), TLC 2.77 L (55% pred) ERV 0.03 (3% pred), DLCO 8.4 (35% pred) 08/2012 ABG >> 7.39/52/57/31.5/92% 10/01/2012 PSG>> poor sleep efficiency; AHI < 5; desaturated as low as 82% 09/2012 PFT [facility] >> unchanged from 08/2012 study. 10/20/2012 MIP/MEP >> 36 (36% pred)/-20 (28% pred) 09/2012 serology>> ANA neg, Anti-SCL70 neg, SSA/SSB neg/neg, Anti-Jo-1 neg, Aldolase 5, CRP 5, ESR 30, RF 31 10/24/2012 Sniff test >> normal diaphragm movement 09/2012 Home PSG >> AHI 12, several desaturation events independent of apnea 09/2012 Hypersensitivity Pneumonitis profile> Asp flav pos/asp nig partial; all else negative 12/09/2012 >> 950 feet on 2L [place] 12/16/2012 V/Q scan >> multiple areas of matched decreased ventilation and perfusion uptake, more in LUL 01/21/2013 RHC >> RA 9 mmHg, RV 32/8 mmHg, PA 30/16/23 mmHg, PCWP 12 mmHg, Normal wave forms. Oxygen saturations: (done with 2 L ), PA 73 %, AO  98%, RV 74%, RA 75%, SVC 75%, IVC 83%; Cardiac Output (Fick) 5.3, Cardiac Index (Fick) 2.7, PVR: 2.1        Assessment & Plan:   Obstructive sleep apnea Continue CPAP with Oxygen at night.  Chronic hypoxemic respiratory failure O2 saturation in the office today was 90% walking on room air.  Continue O2 at night.  Again, the etiology of this is uncertain.  She is to have an open lung biopsy in November 2014 at [facility].  See my previously notes for discussion.    Updated Medication List Outpatient Encounter Prescriptions as of 05/26/2013  Medication Sig Dispense Refill  . aspirin 81 MG EC tablet Take 81 mg by mouth daily.        . Calcium Citrate-Vitamin D 200-125 MG-UNIT TABS Take by mouth daily.        . carvedilol (COREG)  25 MG tablet Take 1 tablet (25 mg total) by mouth 2 (two) times daily.  180 tablet  3  . Cholecalciferol (VITAMIN D3) 1000 UNITS tablet Take 1,000 Units by mouth daily.        . Coenzyme Q10 200 MG capsule Take 200 mg by mouth daily.        . colchicine 0.6 MG tablet Take 1 tablet (0.6 mg total) by mouth 2 (two) times daily as needed. 1 two times a day to three times a day as needed for gout  60 tablet  6  . dextromethorphan (DELSYM) 30 MG/5ML liquid Take 10 mLs (60 mg total) by mouth as needed.  89 mL    . dextromethorphan-guaiFENesin (MUCINEX DM) 30-600 MG per 12 hr tablet Take 1 tablet by mouth as needed.      Marland Kitchen losartan (COZAAR) 50 MG tablet One half tab every day.  90 tablet  3  . mometasone (NASONEX) 50 MCG/ACT nasal spray Place 2 sprays into the nose daily.  17 g  2  . multivitamin (THERAGRAN) per tablet Take 1 tablet by mouth daily.       . Omega-3 Fatty Acids (FISH OIL) 1000 MG CPDR Take 1,200 mg by mouth daily.       . potassium chloride (K-DUR,KLOR-CON) 10 MEQ tablet Take 1 tablet (10 mEq total) by mouth daily.  90 tablet  3  . simvastatin (ZOCOR) 20 MG tablet TAKE 1 TABLET BY MOUTH DAILY  90 tablet  1   No facility-administered encounter medications on  file as of 05/26/2013.

## 2013-05-26 NOTE — Assessment & Plan Note (Signed)
Continue CPAP with Oxygen at night.

## 2013-07-24 ENCOUNTER — Ambulatory Visit (INDEPENDENT_AMBULATORY_CARE_PROVIDER_SITE_OTHER): Payer: Medicare Other | Admitting: Adult Health

## 2013-07-24 VITALS — BP 118/74 | HR 80 | Temp 97.4°F | Wt 199.0 lb

## 2013-07-24 DIAGNOSIS — M109 Gout, unspecified: Secondary | ICD-10-CM

## 2013-07-24 NOTE — Assessment & Plan Note (Signed)
Continue colchine. May take tylenol for mild pain. She also has oxycodone which was recently prescribed. She can take this medication for severe pain. Advised to RTC or urgent care if symptoms worsen, fever or develops increased erythema and swelling of joint.

## 2013-07-24 NOTE — Progress Notes (Signed)
   Subjective:    Patient ID: Betty [name], female    DOB: 1946-07-04, 67 y.o.   MRN: [id number]  HPI  Patient is a pleasant 67 y/o female with hx of gout who presents to clinic with pain on the left ankle. She reports she has had episodes of gout in this area in the past. Reports recent trip to the beach where she ate sea food. Patient reports that she is followed by podiatry. They had mentioned if she developed another gout attack that they could do an intra-articular injection to help with her pain. She went to their office however they were closed for the holiday. Patient has been taking colchicine.  Current Outpatient Prescriptions on File Prior to Visit  Medication Sig Dispense Refill  . aspirin 81 MG EC tablet Take 81 mg by mouth daily.        . Calcium Citrate-Vitamin D 200-125 MG-UNIT TABS Take by mouth daily.        . carvedilol (COREG) 25 MG tablet Take 1 tablet (25 mg total) by mouth 2 (two) times daily.  180 tablet  3  . Cholecalciferol (VITAMIN D3) 1000 UNITS tablet Take 1,000 Units by mouth daily.        . Coenzyme Q10 200 MG capsule Take 200 mg by mouth daily.        . colchicine 0.6 MG tablet Take 1 tablet (0.6 mg total) by mouth 2 (two) times daily as needed. 1 two times a day to three times a day as needed for gout  60 tablet  6  . dextromethorphan (DELSYM) 30 MG/5ML liquid Take 10 mLs (60 mg total) by mouth as needed.  89 mL    . dextromethorphan-guaiFENesin (MUCINEX DM) 30-600 MG per 12 hr tablet Take 1 tablet by mouth as needed.      Marland Kitchen losartan (COZAAR) 50 MG tablet One half tab every day.  90 tablet  3  . mometasone (NASONEX) 50 MCG/ACT nasal spray Place 2 sprays into the nose daily.  17 g  2  . multivitamin (THERAGRAN) per tablet Take 1 tablet by mouth daily.       . Omega-3 Fatty Acids (FISH OIL) 1000 MG CPDR Take 1,200 mg by mouth daily.       . potassium chloride (K-DUR,KLOR-CON) 10 MEQ tablet Take 1 tablet (10 mEq total) by mouth daily.  90 tablet  3  .  simvastatin (ZOCOR) 20 MG tablet TAKE 1 TABLET BY MOUTH DAILY  90 tablet  1   No current facility-administered medications on file prior to visit.     Review of Systems  Musculoskeletal:       Pain in ankle and heel joint. Reports gout in this area in the past.       Objective:   Physical Exam  Musculoskeletal:  Mild erythema in the heel and lower part of ankle.           Assessment & Plan:

## 2013-07-24 NOTE — Progress Notes (Signed)
Pre visit review using our clinic review tool, if applicable. No additional management support is needed unless otherwise documented below in the visit note. 

## 2013-08-03 ENCOUNTER — Ambulatory Visit: Payer: Self-pay | Admitting: Podiatry

## 2013-09-07 ENCOUNTER — Telehealth: Payer: Self-pay

## 2013-09-07 ENCOUNTER — Encounter: Payer: Self-pay | Admitting: Pulmonary Disease

## 2013-09-07 NOTE — Telephone Encounter (Signed)
Pt is scheduled for 09/29/13 @10 :00.

## 2013-09-07 NOTE — Telephone Encounter (Signed)
Message copied by Len Blalock on Mon Sep 07, 2013  5:09 PM ------      Message from: Simonne Maffucci B      Created: Mon Sep 07, 2013  5:02 PM       A,            Can you have her see me in the next month?            Thanks      B ------

## 2013-09-29 ENCOUNTER — Ambulatory Visit (INDEPENDENT_AMBULATORY_CARE_PROVIDER_SITE_OTHER): Payer: Medicare Other | Admitting: Pulmonary Disease

## 2013-09-29 ENCOUNTER — Encounter: Payer: Self-pay | Admitting: Pulmonary Disease

## 2013-09-29 VITALS — BP 112/66 | HR 69 | Temp 98.1°F | Ht 65.0 in | Wt 202.0 lb

## 2013-09-29 DIAGNOSIS — I2789 Other specified pulmonary heart diseases: Secondary | ICD-10-CM

## 2013-09-29 DIAGNOSIS — R0600 Dyspnea, unspecified: Secondary | ICD-10-CM

## 2013-09-29 DIAGNOSIS — R0989 Other specified symptoms and signs involving the circulatory and respiratory systems: Secondary | ICD-10-CM

## 2013-09-29 DIAGNOSIS — I272 Pulmonary hypertension, unspecified: Secondary | ICD-10-CM

## 2013-09-29 DIAGNOSIS — R0609 Other forms of dyspnea: Secondary | ICD-10-CM

## 2013-09-29 NOTE — Patient Instructions (Signed)
We will refer you to [facility] for a cardiopulmonary stress test with pulmonary artery catheterization We will see you back in 4-6 months or sooner if needed

## 2013-09-29 NOTE — Progress Notes (Signed)
Subjective:    Patient ID: Betty [name], female    DOB: 05/18/1946, 67 y.o.   MRN: [id number]  Synopsis: Betty [name] is a very pleasant female who first saw the [facility] in December 2013 for evaluation of shortness of breath. She is a lifetime nonsmoker. She developed increasing shortness of breath over the course of 2 years about 2011 to 2013. Pulmonary function testing showed restrictive lung disease with a markedly depressed DLCO at 35% predicted. CT scanning of her chest showed upper lobe groundglass abnormalities versus air trapping.  A home polysomnogram showed an AHI of 12 and multiple desaturation events to 80% independent of apneas.  An abg showed resting hypercarbia (pCO2 52), and a MIP/MEP was 36%/28% predicted.  A follow up diaphragm study was normal.  A repeat CT scan was performed, no pulmonary embolism was seen and radiology commented on atelectasis but no clear intersitial lung disease.  Blood work including an HP panel and serologies for connective tissue diseases has been essentially negative (two partial aspergillus bands on HP panel).  Two echocardiograms have not shown evidence of pulmonary hypertension.  After starting on CPAP with nasal pillows and oxygen with exertion and sleep she started feeling better.  A right heart catheterization performed 01/21/2013 did not show pulmonary hypertension.  She had an open lung biopsy at [facility] in November of 2014 which showed small amounts of fibrosis around her pulmonary arteries as well as findings consistent with very mild bronchiolitis.  HPI   10/21/2012 routine office visit--Betty [name] it says that she feels about the same since the last visit. Her husband is with her today. He states that there has been a clear change in her ability to exercise in the last 8 months. She can now "barely walk to the car without getting short of breath". She has not had a change in her mild dry cough. She  does not have chest pain she has not had increasing leg swelling. She tells me that during the polysomnogram she did not sleep. There have been no changes to her medications since the last visit.  11/11/2012 ROV > Betty [name] says that she has been doing the same since the last visit. She still has fatigue and shortness of breath. She denies significant cough and denies sputum production. She has no new pain or swelling anywhere since the last visit. She is supposed to have CPAP set up in her home on Thursday of this week. Today in the office she desaturated with walking.    11/25/12 ROV > Betty [name] says that she is feeling better and has more energy since using CPAP and oxygen regularly.  She states that her thinking is more clear than before.  She can walk further than before and overall feels better.  She has been having some sinus congestion with her CPAP at night which she is using with nasal pillows.  She is using CPAP every night.  She does not have leg swelling or chest pain.  She has not been cleaning her CPAP machine.    01/27/2013 ROV >> Betty [name] about the same since the last visit. She has more shortness of breath when she is going out and about in the heat. She has noticed itching eyes and scratchy throat in the last few weeks. She currently does not take any medication for allergies. She continues to use her oxygen with exertion and at sleep. She uses her CPAP every night. She's not had chest pain or leg   swelling. She does not have cough.  05/26/2013 ROV >> She plans to have an open lung biopsy in November at [facility]. Her oxygen level has been OK, her O2 saturation has been OK.  She is using her oxygen at night with her CPAP machine. She thinks that her symptoms and fatigue is a little better since she first met, but in general she is not feeling like things "should be".  She has lost 11 pounds intentionally, but still has some fatigue.   09/29/2013 ROV > Betty [name] says that she thinks that she is doing OK and  perhaps better than before. She is not walking much and she is doing no housework.  She is limiting her activity because she is short of breath with activity.  She felt dyspnic with just taking out the trash this morning.  Climbing a flight of stairs makes her shot of breath. She continues to use her CPAP machine every night.   Past Medical History  Diagnosis Date  . Hyperlipidemia   . Gout   . NICM (nonischemic cardiomyopathy) 2002    EF 25%; improved to normal - echo 4/08: EF 50-55%, mild MR, mild LAE, mild TR;    cath 3/03: normal cors, EF 40%  . Glucose intolerance (impaired glucose tolerance)   . History of chicken pox   . Allergic rhinitis     never tested, fall and spring    Review of Systems  Constitutional: Negative for fever, chills and fatigue.  HENT: Negative for congestion, postnasal drip and rhinorrhea.   Respiratory: Positive for shortness of breath. Negative for cough, wheezing and stridor.   Cardiovascular: Negative for chest pain, palpitations and leg swelling.       Objective:   Physical Exam  Filed Vitals:   09/29/13 1028  BP: 112/66  Pulse: 69  Temp: 98.1 F (36.7 C)  TempSrc: Oral  Height: 5' 5" (1.651 m)  Weight: 202 lb (91.627 kg)  SpO2: 92%  RA  O2 saturation dropped to 90% walking 500 feet  Gen: well appearing, no acute distress HEENT: NCAT, EOMi, OP clear,  PULM: no crackles today, few wheezes in bases CV: RRR, no mgr, no JVD AB: BS+, soft, nontender, no hsm Ext: warm, trace ankle edema, no clubbing, no cyanosis   06/2012 Echo>> grade 1 diastolic dysfunction, LVEF 55%, RV/RA normal, triscupid regurge jet velocity WNL 08/2012 PFT [facility] >> Ratio normal, Flow volume loop normal; FVC 1.46 L (48% pred), TLC 2.77 L (55% pred) ERV 0.03 (3% pred), DLCO 8.4 (35% pred) 08/2012 ABG >> 7.39/52/57/31.5/92% 10/01/2012 PSG>> poor sleep efficiency; AHI < 5; desaturated as low as 82% 09/2012 PFT [facility] >> unchanged from 08/2012 study. 10/20/2012 MIP/MEP >> 36 (36%  pred)/-20 (28% pred) 09/2012 serology>> ANA neg, Anti-SCL70 neg, SSA/SSB neg/neg, Anti-Jo-1 neg, Aldolase 5, CRP 5, ESR 30, RF 31 10/24/2012 Sniff test >> normal diaphragm movement 09/2012 Home PSG >> AHI 12, several desaturation events independent of apnea 09/2012 Hypersensitivity Pneumonitis profile> Asp flav pos/asp nig partial; all else negative 12/09/2012 6MW >> 950 feet on 2L [place] 12/16/2012 V/Q scan >> multiple areas of matched decreased ventilation and perfusion uptake, more in LUL 01/21/2013 [facility] >> RA 9 mmHg, RV 32/8 mmHg, PA 30/16/23 mmHg, PCWP 12 mmHg, Normal wave forms. Oxygen saturations: (done with 2 L [place]), PA 73 %, AO 98%, RV 74%, RA 75%, SVC 75%, IVC 83%; Cardiac Output (Fick) 5.3, Cardiac Index (Fick) 2.7, PVR: 2.1  07/2013 Open lung biopsy [facility]> mild peribronchiolar fibrosis without inflammatory  cells, also mild scattered areas of pulmonary arteriopathic change; normal airspace/alveolar tissue       Assessment & Plan:   Dyspnea Betty [name]'s biopsy was consistent with her CT scan in that it showed mild bronchiolitis as well as scattered areas of pulmonary artery fibrosis/stenosis. At this point there is really no name to describe his condition but this is clearly why she has experienced increasing shortness of breath over the years.  I explained to her that for pulmonary hypertension there is no real role for anti-inflammatory therapy. In fact, her right heart cath was normal last year. I wonder whether or not she has exertional pulmonary hypertension and so I agree with Dr. Morrison that the next best step is to order an exercise right heart catheterization. We will request that this be performed at [facility]. If that test shows exercise induced pulmonary hypertension, and I feel strongly that it would be wise to start vasodilator therapy.  If that test is negative, then I would consider a trial of steroids for the bronchiolitis.  Plan: -Request exercise right heart  catheterization at [facility]    Updated Medication List Outpatient Encounter Prescriptions as of 09/29/2013  Medication Sig  . aspirin 81 MG EC tablet Take 81 mg by mouth daily.    . Calcium Citrate-Vitamin D 200-125 MG-UNIT TABS Take by mouth daily.    . carvedilol (COREG) 25 MG tablet Take 1 tablet (25 mg total) by mouth 2 (two) times daily.  . Cholecalciferol (VITAMIN D3) 1000 UNITS tablet Take 1,000 Units by mouth daily.    . Coenzyme Q10 200 MG capsule Take 200 mg by mouth daily.    . colchicine 0.6 MG tablet Take 1 tablet (0.6 mg total) by mouth 2 (two) times daily as needed. 1 two times a day to three times a day as needed for gout  . dextromethorphan (DELSYM) 30 MG/5ML liquid Take 10 mLs (60 mg total) by mouth as needed.  . dextromethorphan-guaiFENesin (MUCINEX DM) 30-600 MG per 12 hr tablet Take 1 tablet by mouth as needed.  . losartan (COZAAR) 50 MG tablet One half tab every day.  . mometasone (NASONEX) 50 MCG/ACT nasal spray Place 2 sprays into the nose daily.  . multivitamin (THERAGRAN) per tablet Take 1 tablet by mouth daily.   . Omega-3 Fatty Acids (FISH OIL) 1000 MG CPDR Take 1,200 mg by mouth daily.   . potassium chloride (K-DUR,KLOR-CON) 10 MEQ tablet Take 1 tablet (10 mEq total) by mouth daily.  . simvastatin (ZOCOR) 20 MG tablet TAKE 1 TABLET BY MOUTH DAILY      

## 2013-09-29 NOTE — Assessment & Plan Note (Signed)
Betty [name]'s biopsy was consistent with her CT scan in that it showed mild bronchiolitis as well as scattered areas of pulmonary artery fibrosis/stenosis. At this point there is really no name to describe his condition but this is clearly why she has experienced increasing shortness of breath over the years.  I explained to her that for pulmonary hypertension there is no real role for anti-inflammatory therapy. In fact, her right heart cath was normal last year. I wonder whether or not she has exertional pulmonary hypertension and so I agree with Dr. Randol Kern that the next best step is to order an exercise right heart catheterization. We will request that this be performed at [facility]. If that test shows exercise induced pulmonary hypertension, and I feel strongly that it would be wise to start vasodilator therapy.  If that test is negative, then I would consider a trial of steroids for the bronchiolitis.  Plan: -Request exercise right heart catheterization at [facility]

## 2013-09-29 NOTE — Addendum Note (Signed)
Addended by: Len Blalock on: 09/29/2013 04:02 PM   Modules accepted: Orders

## 2013-09-30 NOTE — Addendum Note (Signed)
Addended by: Len Blalock on: 09/30/2013 10:15 AM   Modules accepted: Orders

## 2013-10-01 ENCOUNTER — Encounter: Payer: Self-pay | Admitting: Internal Medicine

## 2013-10-14 ENCOUNTER — Ambulatory Visit (INDEPENDENT_AMBULATORY_CARE_PROVIDER_SITE_OTHER): Payer: Medicare Other | Admitting: *Deleted

## 2013-10-14 DIAGNOSIS — Z23 Encounter for immunization: Secondary | ICD-10-CM

## 2013-10-14 NOTE — Progress Notes (Signed)
Patient was informed immunization may not be covered by insurance and she may be responsible for payment. Waiver was also signed at the time of visit.

## 2013-10-20 ENCOUNTER — Telehealth: Payer: Self-pay | Admitting: Internal Medicine

## 2013-10-20 NOTE — Telephone Encounter (Signed)
Pt states she received notice from [facility] to schedule mammogram.  Mammograms are routine.  Pt states she spoke with Dr. Gilford Rile at last appt about having mammograms done "somewhere in [place]", and she would like this to be scheduled for her.  No further info given.

## 2013-10-23 NOTE — Telephone Encounter (Signed)
[facility] has now merged with [facility], so will be the same radiologist. I would recommend going to [place], unless she has strong preference to go to [place].

## 2013-10-23 NOTE — Telephone Encounter (Signed)
Fwd to Dr. Garoutte 

## 2013-10-26 NOTE — Telephone Encounter (Signed)
Left detailed message on patient voicemail with this information. Patient may or may not call back.

## 2013-10-28 NOTE — Telephone Encounter (Signed)
Patient never returned call  

## 2013-11-02 ENCOUNTER — Other Ambulatory Visit: Payer: Self-pay | Admitting: Internal Medicine

## 2013-11-17 ENCOUNTER — Ambulatory Visit: Payer: Self-pay | Admitting: Internal Medicine

## 2013-11-30 ENCOUNTER — Other Ambulatory Visit: Payer: Self-pay | Admitting: Internal Medicine

## 2013-12-03 ENCOUNTER — Encounter: Payer: Self-pay | Admitting: Internal Medicine

## 2013-12-04 ENCOUNTER — Encounter (HOSPITAL_COMMUNITY): Payer: Self-pay

## 2013-12-07 NOTE — Telephone Encounter (Signed)
Pt will need to come on and sign a release of information.

## 2013-12-15 ENCOUNTER — Encounter: Payer: Self-pay | Admitting: Pulmonary Disease

## 2013-12-15 NOTE — Telephone Encounter (Signed)
Dr. Lake Bells according to last OV you ordered:  We will refer you to [facility] for a cardiopulmonary stress test with pulmonary artery catheterization. I do not see any results in the pt chart and she has sent an email requesting the results. Please advise if you have seen these results. If not we will obtain them from [facility]. Dallam Bing, CMA

## 2013-12-16 ENCOUNTER — Telehealth: Payer: Self-pay | Admitting: Pulmonary Disease

## 2013-12-16 NOTE — Telephone Encounter (Signed)
Noted by triage Will sign off 

## 2013-12-16 NOTE — Telephone Encounter (Signed)
Phone note created in order to access Care Everywhere.   10/30/13 reports printed for BQ to review Will also forward empty note to BQ in case he would like to review the reports via Care Everywhere rather than waiting for the prints.

## 2013-12-16 NOTE — Telephone Encounter (Signed)
[facility] is on Care Everywhere, but Epic will not allow this to be accessed thru a document other than a current office visit or phone note.  I have created a phone note and printed the Cath and Stress Test reports for BQ.  Dr Lake Bells, I have routed the empty phone note to you in case you would like to access Care Everywhere thru that document or I have handed the printed reports to Caryl Pina to hold for you for when you are next in the office.  Email sent back to pt to inform her that we are working on obtaining her records and forwarding them to BQ.

## 2013-12-16 NOTE — Telephone Encounter (Signed)
Sent note to patient via EPIC

## 2013-12-17 ENCOUNTER — Encounter: Payer: Self-pay | Admitting: Pulmonary Disease

## 2013-12-17 NOTE — Telephone Encounter (Signed)
MyChart message sent from pt today: Betty [name], I have made an appointment to see you on May 6. I have been seeing Betty [name] since 2003 as my cardiologist. If there is some other cardiologist that would be better, I would not be opposed to changing. I have the utmost confidence in working with you . Thanks for your prompt replies. Betty [name]  As BQ and pt have been communicating, will forward to Betty [name].  This is BQ's last email to pt dated 4/22:  Betty [name],   From what I can tell from the test your heart muscle became stiffer and you began to collect fluid in your lungs with exercise. This happened because your heart became unusually stiff with exercise. As I interpret the results I do not think that it would make sense to start a drug specifically for pulmonary hypertension. However, I would like to know how Betty [name] feels about this as she is one of the experts who taught me about pulmonary hypertension.   So for now I think the next step is to have you see a cardiologist because often medications can be used to help the heart relax. Does Betty [name] plan to see you again in clinic? If not then I am happy to see you and to help decide what medicines to use to help treat this problem.   At least now I think we are starting to finally understand what is wrong with you. I appreciate your patients with going through all these tests.   [employer]

## 2013-12-18 ENCOUNTER — Encounter: Payer: Self-pay | Admitting: Pulmonary Disease

## 2013-12-22 LAB — HM MAMMOGRAPHY: HM MAMMO: NORMAL

## 2013-12-22 LAB — HM DIABETES FOOT EXAM

## 2013-12-29 ENCOUNTER — Other Ambulatory Visit: Payer: Self-pay | Admitting: Internal Medicine

## 2013-12-30 ENCOUNTER — Ambulatory Visit (INDEPENDENT_AMBULATORY_CARE_PROVIDER_SITE_OTHER): Payer: Medicare Other | Admitting: Pulmonary Disease

## 2013-12-30 ENCOUNTER — Encounter: Payer: Self-pay | Admitting: Pulmonary Disease

## 2013-12-30 VITALS — BP 120/68 | HR 84 | Ht 65.0 in | Wt 205.0 lb

## 2013-12-30 DIAGNOSIS — I509 Heart failure, unspecified: Secondary | ICD-10-CM

## 2013-12-30 DIAGNOSIS — R0989 Other specified symptoms and signs involving the circulatory and respiratory systems: Secondary | ICD-10-CM

## 2013-12-30 DIAGNOSIS — R0600 Dyspnea, unspecified: Secondary | ICD-10-CM

## 2013-12-30 DIAGNOSIS — R0609 Other forms of dyspnea: Secondary | ICD-10-CM

## 2013-12-30 MED ORDER — ALLOPURINOL 100 MG PO TABS: 100.0000 mg | ORAL_TABLET | Freq: Every day | ORAL | Status: DC

## 2013-12-30 MED ORDER — FUROSEMIDE 40 MG PO TABS: 40.0000 mg | ORAL_TABLET | Freq: Every day | ORAL | Status: DC

## 2013-12-30 NOTE — Telephone Encounter (Signed)
Appt 01/21/14

## 2013-12-30 NOTE — Progress Notes (Signed)
Subjective:    Patient ID: Betty [name], female    DOB: [birth year], 68 y.o.   MRN: [id number]  Synopsis: Betty [name] is a very pleasant female who first saw the [facility] pulmonary clinic in December 2013 for evaluation of shortness of breath. She is a lifetime nonsmoker. She developed increasing shortness of breath over the course of 2 years about 2011 to 2013. Pulmonary function testing showed restrictive lung disease with a markedly depressed DLCO at 35% predicted. CT scanning of her chest showed upper lobe groundglass abnormalities versus air trapping.  A home polysomnogram showed an AHI of 12 and multiple desaturation events to 80% independent of apneas.  An abg showed resting hypercarbia (pCO2 52), and a MIP/MEP was 36%/28% predicted.  A follow up diaphragm study was normal.  A repeat CT scan was performed, no pulmonary embolism was seen and radiology commented on atelectasis but no clear intersitial lung disease.  Blood work including an HP panel and serologies for connective tissue diseases has been essentially negative (two partial aspergillus bands on HP panel).  Two echocardiograms have not shown evidence of pulmonary hypertension.  After starting on CPAP with nasal pillows and oxygen with exertion and sleep she started feeling better.  A right heart catheterization performed 01/21/2013 did not show pulmonary hypertension.  She had an open lung biopsy at [facility] in November of 2014 which showed small amounts of fibrosis around her pulmonary arteries as well as findings consistent with very mild bronchiolitis. An exercise right heart catheterization test was performed which demonstrated dramatic increases in her left heart pressure with exercise.  HPI  12/30/2013 routine office visit> Betty [name] has been doing okay since the last visit. She continues to have dyspnea on exertion and she has not remained active since her most recent test up at [facility]. She was told by  Dr. Gilles Chiquito that she should be takin a diuretic. She says the last time she took a diuretic he gave her gout. She has not started exercising again. She is anxious to start back with regular activity. She does have oxygen at home and she has been using it on exertion. She currently has a 7 1/2 pound portable oxygen tank and she is interested in trying something that is lighter than this.   Past Medical History  Diagnosis Date  . Hyperlipidemia   . Gout   . NICM (nonischemic cardiomyopathy) 2002    EF 25%; improved to normal - echo 4/08: EF 50-55%, mild MR, mild LAE, mild TR;    cath 3/03: normal cors, EF 40%  . Glucose intolerance (impaired glucose tolerance)   . History of chicken pox   . Allergic rhinitis     never tested, fall and spring    Review of Systems  Constitutional: Negative for fever, chills and fatigue.  HENT: Negative for congestion, postnasal drip and rhinorrhea.   Respiratory: Positive for shortness of breath. Negative for cough, wheezing and stridor.   Cardiovascular: Negative for chest pain, palpitations and leg swelling.       Objective:   Physical Exam  Filed Vitals:   12/30/13 1221  BP: 120/68  Pulse: 84  Height: 5' 5"  (1.651 m)  Weight: 205 lb (92.987 kg)  SpO2: 95%  RA   Gen: well appearing, no acute distress HEENT: NCAT, EOMi, OP clear,  PULM:  clear to auscultation bilaterally  CV: RRR, no mgr, no JVD AB: BS+, soft, nontender, no hsm Ext: warm, trace ankle edema, no clubbing, no  cyanosis   06/2012 Echo>> grade 1 diastolic dysfunction, LVEF 55%, RV/RA normal, triscupid regurge jet velocity WNL 08/2012 PFT [facility] >> Ratio normal, Flow volume loop normal; FVC 1.46 L (48% pred), TLC 2.77 L (55% pred) ERV 0.03 (3% pred), DLCO 8.4 (35% pred) 08/2012 ABG >> 7.39/52/57/31.5/92% 10/01/2012 PSG>> poor sleep efficiency; AHI < 5; desaturated as low as 82% 09/2012 PFT [facility] >> unchanged from 08/2012 study. 10/20/2012 MIP/MEP >> 36 (36% pred)/-20 (28% pred) 09/2012  serology>> ANA neg, Anti-SCL70 neg, SSA/SSB neg/neg, Anti-Jo-1 neg, Aldolase 5, CRP 5, ESR 30, RF 31 10/24/2012 Sniff test >> normal diaphragm movement 09/2012 Home PSG >> AHI 12, several desaturation events independent of apnea 09/2012 Hypersensitivity Pneumonitis profile> Asp flav pos/asp nig partial; all else negative 12/09/2012 6MW >> 950 feet on 2L [place] 12/16/2012 V/Q scan >> multiple areas of matched decreased ventilation and perfusion uptake, more in LUL 01/21/2013 RHC >> RA 9 mmHg, RV 32/8 mmHg, PA 30/16/23 mmHg, PCWP 12 mmHg, Normal wave forms. Oxygen saturations: (done with 2 L [place]), PA 73 %, AO 98%, RV 74%, RA 75%, SVC 75%, IVC 83%; Cardiac Output (Fick) 5.3, Cardiac Index (Fick) 2.7, PVR: 2.1   07/2013 Open lung biopsy [facility]> mild peribronchiolar fibrosis without inflammatory cells, also mild scattered areas of pulmonary arteriopathic change; normal airspace/alveolar tissue  10/2013 Exercise [facility] at [facility] rest> PA 36/20 (mean 26), PCW 15, RA 9, BP 158/77, PA sat 65.5%, CI 2.88, CO 5.7, PVR 1.75 WU; Exercise> PA 78/40 (56), PCW 28, BP 228/83, CI 6.9, CO 13.75, PVR 2 WU; O2 saturation noted to drop to 77% on RA       Assessment & Plan:   Dyspnea Betty [name] recently underwent an exercise stress right heart catheterization at [facility]. This showed no real evidence of pulmonary hypertension but a dramatic increase in her left heart pressures resulting in exertional hypoxemia.  The findings from her open lung biopsy were not strongly suggestive of a clear lung pathology. I suspect that the findings were reflective of years upon standing diastolic failure.  Plan: - Refer back to cardiology for further evaluation of diastolic heart failure -Continue oxygen on exertion -Start diuretic as per Dr. Sharion Balloon recommendations (will also start allopurinol as the last time she took furosemide she had gout) - f/u with me in 6 months    Updated Medication List Outpatient Encounter Prescriptions as of 12/30/2013   Medication Sig  . aspirin 81 MG EC tablet Take 81 mg by mouth daily.    . Calcium Citrate-Vitamin D 200-125 MG-UNIT TABS Take by mouth daily.    . carvedilol (COREG) 25 MG tablet Take 1 tablet (25 mg total) by mouth 2 (two) times daily.  . Cholecalciferol (VITAMIN D3) 1000 UNITS tablet Take 1,000 Units by mouth daily.    . Coenzyme Q10 200 MG capsule Take 200 mg by mouth daily.    . colchicine 0.6 MG tablet Take 1 tablet (0.6 mg total) by mouth 2 (two) times daily as needed. 1 two times a day to three times a day as needed for gout  . dextromethorphan (DELSYM) 30 MG/5ML liquid Take 10 mLs (60 mg total) by mouth as needed.  Marland Kitchen dextromethorphan-guaiFENesin (MUCINEX DM) 30-600 MG per 12 hr tablet Take 1 tablet by mouth as needed.  Marland Kitchen KLOR-CON 10 10 MEQ tablet TAKE 1 TABLET (10 MEQ TOTAL) BY MOUTH DAILY.  Marland Kitchen losartan (COZAAR) 50 MG tablet One half tab every day.  . meloxicam (MOBIC) 15 MG tablet Take 15 mg by mouth  daily.  . multivitamin (THERAGRAN) per tablet Take 1 tablet by mouth daily.   . Omega-3 Fatty Acids (FISH OIL) 1000 MG CPDR Take 1,200 mg by mouth daily.   . simvastatin (ZOCOR) 20 MG tablet TAKE 1 TABLET BY MOUTH DAILY  . allopurinol (ZYLOPRIM) 100 MG tablet Take 1 tablet (100 mg total) by mouth daily.  . furosemide (LASIX) 40 MG tablet Take 1 tablet (40 mg total) by mouth daily.  . [DISCONTINUED] mometasone (NASONEX) 50 MCG/ACT nasal spray Place 2 sprays into the nose daily.

## 2013-12-30 NOTE — Patient Instructions (Signed)
Take lasix daily Keep taking your potassium Take the allopurinol daily We will set up a blood test next week here (5/13) to make sure your kidney function and potassium are OK Let us know if you want a portable oxygen concentrator Let us know if you can't get an appointment with Dr. Harrington Challenger Stay active We will see you back in 6 months or sooner if needed

## 2013-12-30 NOTE — Assessment & Plan Note (Signed)
Betty [name] recently underwent an exercise stress right heart catheterization at [facility]. This showed no real evidence of pulmonary hypertension but a dramatic increase in her left heart pressures resulting in exertional hypoxemia.  The findings from her open lung biopsy were not strongly suggestive of a clear lung pathology. I suspect that the findings were reflective of years upon standing diastolic failure.  Plan: - Refer back to cardiology for further evaluation of diastolic heart failure -Continue oxygen on exertion -Start diuretic as per Dr. Sharion Balloon recommendations (will also start allopurinol as the last time she took furosemide she had gout) - f/u with me in 6 months

## 2013-12-31 ENCOUNTER — Encounter: Payer: Self-pay | Admitting: Pulmonary Disease

## 2013-12-31 NOTE — Telephone Encounter (Signed)
Called [facility]. Was advised they never received RX.  I gave VO to the pharm. Nothing further needed

## 2014-01-03 NOTE — Progress Notes (Signed)
HPI:  Betty [name] is a 89 yow who I saw in past.  Also followed by Betty [name].  Treadmill test, patinet desaturated. Referred to pulmonary.  Underwent pulmonary evaluation.  R heart catheterizetion normal The patinet was referred to [facility] ([facility] [place])  Underwent R heart cath with exercise. (bike, semirecumbant)  Developed signif hypertension and desaturation.  Felt to reflect diastolic dysfunction  Referred back to continued care.    She remians SOB     She has a history of an nonischemic cardiomyopathy. Cath in 2003 revealed normal coronary arteries ejection fraction 25%. Repeat 2-D echo 3/14 ejection fraction was normal. Myoview scan in 11/13 was considered low risk with normal EF, no ischemia.   Allergies  Allergen Reactions  . Amoxicillin     REACTION: RASH  . Etodolac     Bloody stool  . Hctz [Hydrochlorothiazide]     Increases gout flare  . Meloxicam     constipation  . Sulfonamide Derivatives     REACTION: RASH    Current Outpatient Prescriptions  Medication Sig Dispense Refill  . allopurinol (ZYLOPRIM) 100 MG tablet Take 1 tablet (100 mg total) by mouth daily.  30 tablet  2  . aspirin 81 MG EC tablet Take 81 mg by mouth daily.        . Calcium Citrate-Vitamin D 200-125 MG-UNIT TABS Take by mouth daily.        . carvedilol (COREG) 25 MG tablet TAKE 1 TABLET (25 MG TOTAL) BY MOUTH 2 (TWO) TIMES DAILY.  60 tablet  0  . Cholecalciferol (VITAMIN D3) 1000 UNITS tablet Take 1,000 Units by mouth daily.        . Coenzyme Q10 200 MG capsule Take 200 mg by mouth daily.        . colchicine 0.6 MG tablet Take 1 tablet (0.6 mg total) by mouth 2 (two) times daily as needed. 1 two times a day to three times a day as needed for gout  60 tablet  6  . dextromethorphan (DELSYM) 30 MG/5ML liquid Take 10 mLs (60 mg total) by mouth as needed.  89 mL    . dextromethorphan-guaiFENesin (MUCINEX DM) 30-600 MG per 12 hr tablet Take 1 tablet by mouth as needed.      . furosemide (LASIX) 40 MG tablet Take 1  tablet (40 mg total) by mouth daily.  30 tablet  11  . KLOR-CON 10 10 MEQ tablet TAKE 1 TABLET (10 MEQ TOTAL) BY MOUTH DAILY.  30 tablet  0  . losartan (COZAAR) 50 MG tablet One half tab every day.  90 tablet  3  . meloxicam (MOBIC) 15 MG tablet Take 15 mg by mouth daily.      . multivitamin (THERAGRAN) per tablet Take 1 tablet by mouth daily.       . Omega-3 Fatty Acids (FISH OIL) 1000 MG CPDR Take 1,200 mg by mouth daily.       . simvastatin (ZOCOR) 20 MG tablet TAKE 1 TABLET BY MOUTH DAILY  30 tablet  1  . [DISCONTINUED] potassium chloride (K-DUR,KLOR-CON) 10 MEQ tablet Take 1 tablet (10 mEq total) by mouth daily.  90 tablet  3   No current facility-administered medications for this visit.    Past Medical History  Diagnosis Date  . Hyperlipidemia   . Gout   . NICM (nonischemic cardiomyopathy) 2002    EF 25%; improved to normal - echo 4/08: EF 50-55%, mild MR, mild LAE, mild TR;    cath 3/03: normal  cors, EF 40%  . Glucose intolerance (impaired glucose tolerance)   . History of chicken pox   . Allergic rhinitis     never tested, fall and spring    Past Surgical History  Procedure Laterality Date  . Nsvd      x 2  . Cystectomy  1996    l breast  . Choleystectomy  02/2002  . Vaginal delivery      x2  . Tubal ligation  1975  . Dilation and curettage of uterus  2011    Dr. Hulan Fray at [facility]  . Induced abortion    . Breast biopsy  80's    Cyst removed    Family History  Problem Relation Age of Onset  . Lymphoma Mother   . Cancer Mother 13    Lymphoma  . COPD Father     was a smoker  . Stroke Father   . Pneumonia Father   . Diabetes Father   . Hyperlipidemia Father   . Heart disease Father   . Diabetes Sister   . Depression Sister   . Meniere's disease Brother   . Diabetes Paternal Grandfather   . Heart disease Paternal Grandfather   . Schizophrenia Daughter   . Cancer Maternal Grandmother     Bladder  . Cancer Maternal Grandfather     leukemia  .  Colon cancer Neg Hx   . Stomach cancer Neg Hx     History   Social History  . Marital Status: Married    Spouse Name: N/A    Number of Children: 2  . Years of Education: N/A   Occupational History  . Retired Pharmacist, hospital   . Works at [employer]  . Smoking status: Never Smoker   . Smokeless tobacco: Never Used  . Alcohol Use: No  . Drug Use: No  . Sexual Activity: No   Other Topics Concern  . Not on file   Social History Narrative   Lives in [place] with husband. 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 No pets.    Review of Systems:  All systems reviewed.  They are negative to the above problem except as previously stated.  Vital Signs: BP 118/70  Pulse 86  Ht 5\' 5"  (1.651 m)  Wt 203 lb (92.08 kg)  BMI 33.78 kg/m2  Physical Exam Patient in NAD at rest.   HEENT:  Normocephalic, atraumatic. EOMI, PERRLA.  Neck: JVP is normal.  No bruits.  Lungs: clear to auscultation. No rales no wheezes.  Heart: Regular rate and rhythm. Normal S1, S2. No S3.   No significant murmurs. PMI not displaced.  Abdomen:  Supple, nontender. Normal bowel sounds. No masses. No hepatomegaly.  Extremities:   Good distal pulses throughout. No lower extremity edema.  Musculoskeletal :moving all extremities.  Neuro:   alert and oriented x3.  CN II-XII grossly intact.  EKG  SR 86 bpm  Nonspecific ST T wave changes   Assessment and Plan:  1  Dyspnea  Reviewed records from [place] at [facility]  Very hypertensive response  Patinet supine at time and had been off Coreg for over 12 hours  Confuses interpretation some I have reviewed with Samule Dry.   Will get labs today  Patinet to follow bp at home even while active COnsider other meds for bp  (vasodilators or central agents) Patient needs to lose wt  Discussed calorie, particularly carb restriction  2.  CM  Patient's LV function has improved  WOuld continue meds    Addendum  NOte with walking in clinic  (short distance) patient oxygen sats dropped to 84% SBP was only 156.  Much different than findings at exercise cath.

## 2014-01-05 ENCOUNTER — Other Ambulatory Visit: Payer: Self-pay | Admitting: Internal Medicine

## 2014-01-06 NOTE — Telephone Encounter (Signed)
Appt 01/21/14

## 2014-01-08 ENCOUNTER — Encounter: Payer: Self-pay | Admitting: Internal Medicine

## 2014-01-08 ENCOUNTER — Ambulatory Visit (INDEPENDENT_AMBULATORY_CARE_PROVIDER_SITE_OTHER): Payer: Medicare Other | Admitting: Internal Medicine

## 2014-01-08 VITALS — BP 118/70 | HR 86 | Ht 65.0 in | Wt 203.0 lb

## 2014-01-08 DIAGNOSIS — R0609 Other forms of dyspnea: Secondary | ICD-10-CM

## 2014-01-08 DIAGNOSIS — R0989 Other specified symptoms and signs involving the circulatory and respiratory systems: Secondary | ICD-10-CM

## 2014-01-08 DIAGNOSIS — R0600 Dyspnea, unspecified: Secondary | ICD-10-CM

## 2014-01-08 NOTE — Patient Instructions (Signed)
Your physician recommends that you HAVE LAB WORK TODAY

## 2014-01-09 LAB — BASIC METABOLIC PANEL WITH GFR
BUN: 15 mg/dL (ref 6–23)
CALCIUM: 10.1 mg/dL (ref 8.4–10.5)
CO2: 31 mEq/L (ref 19–32)
CREATININE: 0.64 mg/dL (ref 0.50–1.10)
Chloride: 95 mEq/L — ABNORMAL LOW (ref 96–112)
GFR, Est African American: >89 mL/min
Glucose, Bld: 90 mg/dL (ref 70–99)
Potassium: 3.9 mEq/L (ref 3.5–5.3)
Sodium: 141 mEq/L (ref 135–145)

## 2014-01-21 ENCOUNTER — Ambulatory Visit (INDEPENDENT_AMBULATORY_CARE_PROVIDER_SITE_OTHER): Payer: Medicare Other | Admitting: Internal Medicine

## 2014-01-21 ENCOUNTER — Encounter: Payer: Self-pay | Admitting: Internal Medicine

## 2014-01-21 VITALS — BP 102/62 | HR 86 | Temp 98.2°F | Ht 64.7 in | Wt 204.0 lb

## 2014-01-21 DIAGNOSIS — Z0000 Encounter for general adult medical examination without abnormal findings: Secondary | ICD-10-CM

## 2014-01-21 DIAGNOSIS — E119 Type 2 diabetes mellitus without complications: Secondary | ICD-10-CM

## 2014-01-21 DIAGNOSIS — Z23 Encounter for immunization: Secondary | ICD-10-CM

## 2014-01-21 LAB — COMPREHENSIVE METABOLIC PANEL
ALBUMIN: 3.9 g/dL (ref 3.5–5.2)
ALK PHOS: 66 U/L (ref 39–117)
ALT: 29 U/L (ref 0–35)
AST: 27 U/L (ref 0–37)
BILIRUBIN TOTAL: 0.4 mg/dL (ref 0.2–1.2)
BUN: 13 mg/dL (ref 6–23)
CO2: 33 mEq/L — ABNORMAL HIGH (ref 19–32)
Calcium: 9.9 mg/dL (ref 8.4–10.5)
Chloride: 97 mEq/L (ref 96–112)
Creatinine, Ser: 0.6 mg/dL (ref 0.4–1.2)
GFR: 98.39 mL/min (ref >60.00)
GLUCOSE: 114 mg/dL — AB (ref 70–99)
Potassium: 3.3 mEq/L — ABNORMAL LOW (ref 3.5–5.1)
Sodium: 140 mEq/L (ref 135–145)
Total Protein: 7.8 g/dL (ref 6.0–8.3)

## 2014-01-21 LAB — MICROALBUMIN / CREATININE URINE RATIO
CREATININE, U: 10.7 mg/dL
Microalb Creat Ratio: 1.9 mg/g (ref 0.0–30.0)
Microalb, Ur: 0.2 mg/dL (ref 0.0–1.9)

## 2014-01-21 LAB — LIPID PANEL
CHOLESTEROL: 157 mg/dL (ref 0–200)
HDL: 60.9 mg/dL (ref >39.00)
LDL Cholesterol: 50 mg/dL (ref 0–99)
Total CHOL/HDL Ratio: 3
Triglycerides: 230 mg/dL — ABNORMAL HIGH (ref 0.0–149.0)
VLDL: 46 mg/dL — AB (ref 0.0–40.0)

## 2014-01-21 LAB — HEMOGLOBIN A1C: HEMOGLOBIN A1C: 7.3 % — AB (ref 4.6–6.5)

## 2014-01-21 NOTE — Progress Notes (Signed)
Subjective:    Patient ID: Betty [name], female    DOB: February 04, 1946, 68 y.o.   MRN: [id number]  HPI The patient is here for annual Medicare wellness examination and management of other chronic and acute problems.   The risk factors are reflected in the social history.  The roster of all physicians providing medical care to patient - is listed in the Snapshot section of the chart.  Activities of daily living:  The patient is 100% independent in all ADLs: dressing, toileting, feeding as well as independent mobility. Lives with husband. No pets.  Home safety : The patient has smoke detectors in the home. They wear seatbelts.  There are no firearms at home. There is no violence in the home.   There is no risks for hepatitis, STDs or HIV. There is no history of blood transfusion. They have no travel history to infectious disease endemic areas of the world.  The patient has seen their dentist in the last six month. (Dr. Metro Kung) They have seen their eye doctor in the last year. (Dr. Linton Flemings) No issues with hearing. They have deferred audiologic testing in the last year.   Pulmonary - Dr. Lake Bells and Dr. Randol Kern and Dr. Gilles Chiquito  They do not  have excessive sun exposure. Discussed the need for sun protection: hats, long sleeves and use of sunscreen if there is significant sun exposure. Dermatology - Dr. Evorn Gong  Podiatry - Dr. Elvina Mattes  Diet: the importance of a healthy diet is discussed. They do have a healthy diet.  The benefits of regular aerobic exercise were discussed. Exercise limited by dyspnea.  Depression screen: there are no signs or vegative symptoms of depression- irritability, change in appetite, anhedonia, sadness/tearfullness.  Cognitive assessment: the patient manages all their financial and personal affairs and is actively engaged. They could relate day,date,year and events.  HCPOA - none at present  The following portions of the patient's history were reviewed and  updated as appropriate: allergies, current medications, past family history, past medical history,  past surgical history, past social history  and problem list.  Visual acuity was not assessed per patient preference since she has regular follow up with her ophthalmologist. Hearing and body mass index were assessed and reviewed.   During the course of the visit the patient was educated and counseled about appropriate screening and preventive services including : fall prevention , diabetes screening, nutrition counseling, colorectal cancer screening, and recommended immunizations.    Dyspnea - She recently underwent cardiac stress test and cath at [facility]. Started on Furosemide 40mg  daily. Feeling well generally with no shortness of breath at rest. Has very little exercise capacity.  Outpatient Encounter Prescriptions as of 01/21/2014  Medication Sig  . allopurinol (ZYLOPRIM) 100 MG tablet Take 1 tablet (100 mg total) by mouth daily.  Marland Kitchen aspirin 81 MG EC tablet Take 81 mg by mouth daily.    . Calcium Citrate-Vitamin D 200-125 MG-UNIT TABS Take by mouth daily.    . carvedilol (COREG) 25 MG tablet TAKE 1 TABLET (25 MG TOTAL) BY MOUTH 2 (TWO) TIMES DAILY.  Marland Kitchen Cholecalciferol (VITAMIN D3) 1000 UNITS tablet Take 1,000 Units by mouth daily.    . Coenzyme Q10 200 MG capsule Take 200 mg by mouth daily.    . colchicine 0.6 MG tablet Take 1 tablet (0.6 mg total) by mouth 2 (two) times daily as needed. 1 two times a day to three times a day as needed for gout  . dextromethorphan (DELSYM) 30 MG/5ML  liquid Take 10 mLs (60 mg total) by mouth as needed.  Marland Kitchen dextromethorphan-guaiFENesin (MUCINEX DM) 30-600 MG per 12 hr tablet Take 1 tablet by mouth as needed.  . furosemide (LASIX) 40 MG tablet Take 1 tablet (40 mg total) by mouth daily.  Marland Kitchen KLOR-CON 10 10 MEQ tablet TAKE 1 TABLET (10 MEQ TOTAL) BY MOUTH DAILY.  Marland Kitchen losartan (COZAAR) 50 MG tablet One half tab every day.  . meloxicam (MOBIC) 15 MG tablet Take 15 mg by  mouth daily.  . multivitamin (THERAGRAN) per tablet Take 1 tablet by mouth daily.   . Omega-3 Fatty Acids (FISH OIL) 1000 MG CPDR Take 1,200 mg by mouth daily.   . simvastatin (ZOCOR) 20 MG tablet TAKE 1 TABLET BY MOUTH DAILY   BP 102/62  Pulse 86  Temp(Src) 98.2 F (36.8 C) (Oral)  Ht 5' 4.7" (1.643 m)  Wt 204 lb (92.534 kg)  BMI 34.28 kg/m2  SpO2 93%   Review of Systems  Constitutional: Negative for fever, chills, appetite change, fatigue and unexpected weight change.  HENT: Negative for congestion, ear pain, sinus pressure, sore throat, trouble swallowing and voice change.   Eyes: Negative for visual disturbance.  Respiratory: Positive for shortness of breath (on exertion). Negative for cough, wheezing and stridor.   Cardiovascular: Negative for chest pain, palpitations and leg swelling.  Gastrointestinal: Negative for nausea, vomiting, abdominal pain, diarrhea, constipation, blood in stool, abdominal distention and anal bleeding.  Genitourinary: Negative for dysuria and flank pain.  Musculoskeletal: Negative for arthralgias, gait problem, myalgias and neck pain.  Skin: Negative for color change and rash.  Neurological: Negative for dizziness and headaches.  Hematological: Negative for adenopathy. Does not bruise/bleed easily.  Psychiatric/Behavioral: Negative for suicidal ideas, sleep disturbance and dysphoric mood. The patient is not nervous/anxious.        Objective:   Physical Exam  Constitutional: She is oriented to person, place, and time. She appears well-developed and well-nourished. No distress.  HENT:  Head: Normocephalic and atraumatic.  Right Ear: External ear normal.  Left Ear: External ear normal.  Nose: Nose normal.  Mouth/Throat: Oropharynx is clear and moist. No oropharyngeal exudate.  Eyes: Conjunctivae are normal. Pupils are equal, round, and reactive to light. Right eye exhibits no discharge. Left eye exhibits no discharge. No scleral icterus.  Neck:  Normal range of motion. Neck supple. No tracheal deviation present. No thyromegaly present.  Cardiovascular: Normal rate, regular rhythm, normal heart sounds and intact distal pulses.  Exam reveals no gallop and no friction rub.   No murmur heard. Pulmonary/Chest: Effort normal and breath sounds normal. No accessory muscle usage. Not tachypneic. No respiratory distress. She has no decreased breath sounds. She has no wheezes. She has no rales. She exhibits no tenderness. Right breast exhibits no inverted nipple, no mass, no nipple discharge, no skin change and no tenderness. Left breast exhibits no inverted nipple, no mass, no nipple discharge, no skin change and no tenderness. Breasts are symmetrical.  Abdominal: Soft. Bowel sounds are normal. She exhibits no distension and no mass. There is no tenderness. There is no rebound and no guarding.  Musculoskeletal: Normal range of motion. She exhibits no edema and no tenderness.  Lymphadenopathy:    She has no cervical adenopathy.  Neurological: She is alert and oriented to person, place, and time. No cranial nerve deficit. She exhibits normal muscle tone. Coordination normal.  Skin: Skin is warm and dry. No rash noted. She is not diaphoretic. No erythema. No pallor.  Psychiatric:  She has a normal mood and affect. Her behavior is normal. Judgment and thought content normal.          Assessment & Plan:

## 2014-01-21 NOTE — Assessment & Plan Note (Signed)
General medical exam including breast exam normal today. PAP and pelvic deferred as PAP normal 2014, HPV neg.  Recent mammogram was normal. Colonoscopy UTD. Reviewed recent notes from [facility].  Encouraged healthy diet and exercise as tolerated, recognizing limitation with dyspnea.  Appropriate screening performed. Follow up 6 months.

## 2014-01-21 NOTE — Progress Notes (Signed)
Pre visit review using our clinic review tool, if applicable. No additional management support is needed unless otherwise documented below in the visit note. 

## 2014-01-21 NOTE — Assessment & Plan Note (Signed)
Will check A1c with labs today. Continue diet control of DM. 

## 2014-01-21 NOTE — Addendum Note (Signed)
Addended by: Vernetta Honey on: 01/21/2014 02:37 PM   Modules accepted: Orders

## 2014-01-25 ENCOUNTER — Encounter: Payer: Self-pay | Admitting: *Deleted

## 2014-01-26 ENCOUNTER — Other Ambulatory Visit: Payer: Self-pay | Admitting: Internal Medicine

## 2014-01-27 ENCOUNTER — Other Ambulatory Visit: Payer: Self-pay | Admitting: Internal Medicine

## 2014-02-01 ENCOUNTER — Telehealth: Payer: Self-pay | Admitting: Pulmonary Disease

## 2014-02-01 DIAGNOSIS — J9611 Chronic respiratory failure with hypoxia: Secondary | ICD-10-CM

## 2014-02-01 NOTE — Telephone Encounter (Signed)
Spoke with [place] from [facility]. She reports pt came to their office stating we sent her over there for a POC. I do not see this documented in chart. Pt is wanting to switch from [facility] to [facility]. She is wanting an order for Oxy-go POC.  I called pt confirm this. She reports she does want to change from [facility] to [facility]. Order placed. Nothing further needed

## 2014-02-02 ENCOUNTER — Telehealth: Payer: Self-pay | Admitting: Cardiology

## 2014-02-02 NOTE — Telephone Encounter (Signed)
Message left to call back tomorrow. She is out of town at a funeral today.

## 2014-02-02 NOTE — Telephone Encounter (Signed)
Message     I have left VM with patient re recommm Not home.     Discussed with Dr Haroldine Laws exercise test at [facility], findings in clinic    Recomm: Add diltiazem CD 180 mg. This will help BP and HR    Would like to sched stress echo (with Lorriane Shire) to eval BP response, sats, diastolic function and possible mitral regurgitation after she has been on meds for a few wks    She should be using oxygen with walkng.     ----- Message -----    From: Fay Records, MD    Sent: 01/11/2014 2:24 PM    To: Fredia Beets, RN, Fay Records, MD        Where is BNP?

## 2014-02-03 ENCOUNTER — Telehealth: Payer: Self-pay | Admitting: Internal Medicine

## 2014-02-03 ENCOUNTER — Other Ambulatory Visit: Payer: Self-pay | Admitting: Internal Medicine

## 2014-02-03 DIAGNOSIS — I428 Other cardiomyopathies: Secondary | ICD-10-CM

## 2014-02-03 DIAGNOSIS — R0600 Dyspnea, unspecified: Secondary | ICD-10-CM

## 2014-02-03 MED ORDER — DILTIAZEM HCL ER COATED BEADS 180 MG PO CP24: 180.0000 mg | ORAL_CAPSULE | Freq: Every day | ORAL | Status: DC

## 2014-02-03 MED ORDER — CARVEDILOL 25 MG PO TABS: ORAL_TABLET | ORAL | Status: DC

## 2014-02-03 NOTE — Telephone Encounter (Signed)
Patient request for Carvidilol rx to be for 3 months. Patient stated that the pharmacy called and informed her that it was time to refill this rx. Please advise pt/msn

## 2014-02-03 NOTE — Telephone Encounter (Signed)
Returned call to patient.Dr.Ross discussed with Dr.Bensimhon exercise test at [facility].She advised to start Diltiazem CD 180 mg daily.Advised will need stress echo in a few weeks after starting Diltiazem.Advised should be using O2 with walking.Schedulers will call back to schedule stress echo.

## 2014-02-03 NOTE — Telephone Encounter (Signed)
Patient is returning calls from yesterday. Please call and advise.

## 2014-02-03 NOTE — Telephone Encounter (Signed)
Resent Rx as 90 day supply.

## 2014-02-04 ENCOUNTER — Telehealth: Payer: Self-pay | Admitting: Internal Medicine

## 2014-02-04 NOTE — Telephone Encounter (Signed)
[facility] will fill prescription for cardizem as ordered by Dr. Gean Birchwood RN

## 2014-02-04 NOTE — Telephone Encounter (Signed)
New message     Drug interaction alert---we need to refill cardizem and diltiazem.  The patient already takes zocor.  Do you still want these pres filled?

## 2014-02-05 ENCOUNTER — Telehealth: Payer: Self-pay

## 2014-02-05 ENCOUNTER — Other Ambulatory Visit: Payer: Self-pay

## 2014-02-05 NOTE — Telephone Encounter (Signed)
Patient was called to schedule stress echo.She stated she picked up diltiazem and pharmacist told her there is a interaction with zocor.Spoke to Dr.Ross she advised to stop zocor.Advised ok to start diltiazem after her vacation.

## 2014-02-05 NOTE — Telephone Encounter (Signed)
Patient was called to schedule stress echo.Stated when she went to pick up diltiazem pharmacist told her interaction with zocor.Spoke to Dr.Ross she advised ok to stop zocor.Advised ok to start diltiazem after her vacation.

## 2014-02-10 ENCOUNTER — Other Ambulatory Visit: Payer: Self-pay | Admitting: Internal Medicine

## 2014-02-28 ENCOUNTER — Encounter: Payer: Self-pay | Admitting: Internal Medicine

## 2014-03-02 ENCOUNTER — Telehealth: Payer: Self-pay | Admitting: Internal Medicine

## 2014-03-02 NOTE — Telephone Encounter (Signed)
New problem    Pt having feet swelling and SOB.

## 2014-03-02 NOTE — Telephone Encounter (Signed)
Patient called. States her legs and ankles have been swollen since starting diltiazem. Also feels more SOB than normal since being on it. Takes diltiazem at night. Weight today is 204.5 (usually is 202-203) BP is 146/76, HR 83.  1.)  Wants to know if she should stop the diltiazem. 2.)  Feeling SOB, she is not sure she can do the treadmill part of stress echo this Friday.  Will forward to Dr. Harrington Challenger to advise.

## 2014-03-03 ENCOUNTER — Encounter: Payer: Self-pay | Admitting: Internal Medicine

## 2014-03-04 ENCOUNTER — Telehealth: Payer: Self-pay | Admitting: Pulmonary Disease

## 2014-03-04 DIAGNOSIS — J9611 Chronic respiratory failure with hypoxia: Secondary | ICD-10-CM

## 2014-03-04 DIAGNOSIS — G4733 Obstructive sleep apnea (adult) (pediatric): Secondary | ICD-10-CM

## 2014-03-04 NOTE — Telephone Encounter (Signed)
[facility] please advise.  thanks

## 2014-03-04 NOTE — Telephone Encounter (Signed)
Order placed. Thanks.

## 2014-03-04 NOTE — Telephone Encounter (Signed)
Need order put in for switch from [facility] to [facility] for cpap and 02

## 2014-03-05 ENCOUNTER — Other Ambulatory Visit (HOSPITAL_COMMUNITY): Payer: Medicare Other

## 2014-03-08 ENCOUNTER — Other Ambulatory Visit: Payer: Self-pay | Admitting: *Deleted

## 2014-03-08 ENCOUNTER — Other Ambulatory Visit: Payer: Self-pay

## 2014-03-08 MED ORDER — ALLOPURINOL 100 MG PO TABS: 100.0000 mg | ORAL_TABLET | Freq: Every day | ORAL | Status: DC

## 2014-03-08 MED ORDER — FUROSEMIDE 40 MG PO TABS: 40.0000 mg | ORAL_TABLET | Freq: Every day | ORAL | Status: DC

## 2014-03-08 MED ORDER — POTASSIUM CHLORIDE ER 10 MEQ PO TBCR: EXTENDED_RELEASE_TABLET | ORAL | Status: DC

## 2014-03-08 NOTE — Telephone Encounter (Signed)
Received fax requesting refill for 90 supply, Last Rx was sent on 02/03/14 as a 90 day supply

## 2014-03-08 NOTE — Telephone Encounter (Signed)
Pharmacy faxed request for 90 day supply

## 2014-03-10 ENCOUNTER — Encounter: Payer: Self-pay | Admitting: Internal Medicine

## 2014-03-10 ENCOUNTER — Other Ambulatory Visit: Payer: Self-pay

## 2014-03-10 MED ORDER — FUROSEMIDE 40 MG PO TABS: 40.0000 mg | ORAL_TABLET | Freq: Every day | ORAL | Status: DC

## 2014-03-12 ENCOUNTER — Telehealth: Payer: Self-pay | Admitting: Internal Medicine

## 2014-03-12 NOTE — Telephone Encounter (Signed)
New message     Pt has had swollen feet for 4wks---pt is off cholesterol medication and diltiazem because pharmacist told her she cannot take them both.  Left foot is better but right foot is still swollen.

## 2014-03-12 NOTE — Telephone Encounter (Signed)
Patient states she has returned an email reply to Dr. Harrington Challenger but has not heard from her regarding what to do for her swollen feet. She is also concerned regarding the fact that she is not taking the her cholesterol med or diltiazem med due to concerns voiced by pharmacist over drug interactions. She would like advisement on both issues as soon as possible. She states she has mild discoloration to her feet and they are swollen but she does not believe she is fluid overloaded. She is taking her Lasix, as prescribed.  Routed to Dr. Harrington Challenger.

## 2014-03-15 NOTE — Telephone Encounter (Signed)
REcomm BMET, BNP Would increase lasix to 80 with KCL20    WIll need f/u with Kathleen Argue or Cecille Rubin

## 2014-03-15 NOTE — Telephone Encounter (Signed)
lmtcb

## 2014-03-16 ENCOUNTER — Ambulatory Visit (INDEPENDENT_AMBULATORY_CARE_PROVIDER_SITE_OTHER): Payer: Medicare Other | Admitting: *Deleted

## 2014-03-16 DIAGNOSIS — I509 Heart failure, unspecified: Secondary | ICD-10-CM

## 2014-03-16 DIAGNOSIS — R0989 Other specified symptoms and signs involving the circulatory and respiratory systems: Secondary | ICD-10-CM

## 2014-03-16 DIAGNOSIS — R0609 Other forms of dyspnea: Secondary | ICD-10-CM

## 2014-03-16 DIAGNOSIS — R0600 Dyspnea, unspecified: Secondary | ICD-10-CM

## 2014-03-16 DIAGNOSIS — E782 Mixed hyperlipidemia: Secondary | ICD-10-CM

## 2014-03-16 LAB — BASIC METABOLIC PANEL
BUN: 15 mg/dL (ref 6–23)
CALCIUM: 9.6 mg/dL (ref 8.4–10.5)
CHLORIDE: 96 meq/L (ref 96–112)
CO2: 30 mEq/L (ref 19–32)
Creatinine, Ser: 0.6 mg/dL (ref 0.4–1.2)
GFR: 114.73 mL/min (ref >60.00)
GLUCOSE: 140 mg/dL — AB (ref 70–99)
Potassium: 3.4 mEq/L — ABNORMAL LOW (ref 3.5–5.1)
Sodium: 138 mEq/L (ref 135–145)

## 2014-03-16 LAB — BRAIN NATRIURETIC PEPTIDE: Pro B Natriuretic peptide (BNP): 12 pg/mL (ref 0.0–100.0)

## 2014-03-17 NOTE — Progress Notes (Signed)
Quick Note:  Spoke with pt, she is aware of lab results. Nothing further needed. ______

## 2014-03-17 NOTE — Telephone Encounter (Signed)
LATE ENTRY: Spoke with patient on 7/20 Informed of Dr. Alan Ripper recommendations for labs, med changes and f/u appointment.  Labs drawn 7/21, f/u appointment with Richardson Dopp for 8/12. Patient verbalizes understanding and agreement of medicine changes and current plan of care.

## 2014-03-19 ENCOUNTER — Other Ambulatory Visit: Payer: Self-pay | Admitting: *Deleted

## 2014-03-19 DIAGNOSIS — R0600 Dyspnea, unspecified: Secondary | ICD-10-CM

## 2014-03-19 DIAGNOSIS — R609 Edema, unspecified: Secondary | ICD-10-CM

## 2014-03-22 ENCOUNTER — Other Ambulatory Visit (INDEPENDENT_AMBULATORY_CARE_PROVIDER_SITE_OTHER): Payer: Medicare Other

## 2014-03-22 DIAGNOSIS — R609 Edema, unspecified: Secondary | ICD-10-CM

## 2014-03-22 DIAGNOSIS — R0609 Other forms of dyspnea: Secondary | ICD-10-CM

## 2014-03-22 DIAGNOSIS — R0989 Other specified symptoms and signs involving the circulatory and respiratory systems: Secondary | ICD-10-CM

## 2014-03-22 DIAGNOSIS — R0600 Dyspnea, unspecified: Secondary | ICD-10-CM

## 2014-03-22 LAB — BASIC METABOLIC PANEL
BUN: 14 mg/dL (ref 6–23)
CO2: 29 mEq/L (ref 19–32)
CREATININE: 0.7 mg/dL (ref 0.4–1.2)
Calcium: 9.2 mg/dL (ref 8.4–10.5)
Chloride: 97 mEq/L (ref 96–112)
GFR: 88.68 mL/min (ref >60.00)
Glucose, Bld: 223 mg/dL — ABNORMAL HIGH (ref 70–99)
Potassium: 3.4 mEq/L — ABNORMAL LOW (ref 3.5–5.1)
Sodium: 141 mEq/L (ref 135–145)

## 2014-03-22 LAB — BRAIN NATRIURETIC PEPTIDE: PRO B NATRI PEPTIDE: 19 pg/mL (ref 0.0–100.0)

## 2014-03-23 ENCOUNTER — Telehealth: Payer: Self-pay | Admitting: *Deleted

## 2014-03-23 MED ORDER — POTASSIUM CHLORIDE ER 20 MEQ PO TBCR: 20.0000 meq | EXTENDED_RELEASE_TABLET | Freq: Two times a day (BID) | ORAL | Status: DC

## 2014-03-23 NOTE — Telephone Encounter (Signed)
Reviewed prelim lab results in Dr. Alan Ripper basket. K+= 3.4  Patient is taking 20 mEq daily currently with 80 mg lasix daily.  Spoke with Dr. Burt Knack (DOD) Order to increase potassium to 40 mEq daily. Detailed message left for patient on voicemail, advised to call to triage tomorrow with any questions.

## 2014-03-25 NOTE — Telephone Encounter (Signed)
Called pt to be sure she received VM to increase her K+.  States she did receive message and increased K+ yesterday. No questions.  To see Margaret Pyle on 8/12.

## 2014-04-07 ENCOUNTER — Encounter: Payer: Self-pay | Admitting: Physician Assistant

## 2014-04-07 ENCOUNTER — Ambulatory Visit (INDEPENDENT_AMBULATORY_CARE_PROVIDER_SITE_OTHER): Payer: Medicare Other | Admitting: Physician Assistant

## 2014-04-07 VITALS — BP 110/70 | HR 76 | Ht 64.5 in | Wt 201.0 lb

## 2014-04-07 DIAGNOSIS — I5032 Chronic diastolic (congestive) heart failure: Secondary | ICD-10-CM

## 2014-04-07 DIAGNOSIS — G4733 Obstructive sleep apnea (adult) (pediatric): Secondary | ICD-10-CM

## 2014-04-07 DIAGNOSIS — E785 Hyperlipidemia, unspecified: Secondary | ICD-10-CM

## 2014-04-07 DIAGNOSIS — I428 Other cardiomyopathies: Secondary | ICD-10-CM

## 2014-04-07 DIAGNOSIS — R609 Edema, unspecified: Secondary | ICD-10-CM

## 2014-04-07 DIAGNOSIS — E876 Hypokalemia: Secondary | ICD-10-CM

## 2014-04-07 LAB — BASIC METABOLIC PANEL
BUN: 16 mg/dL (ref 6–23)
CALCIUM: 9.8 mg/dL (ref 8.4–10.5)
CO2: 31 mEq/L (ref 19–32)
Chloride: 96 mEq/L (ref 96–112)
Creatinine, Ser: 0.6 mg/dL (ref 0.4–1.2)
GFR: 103.93 mL/min (ref >60.00)
Glucose, Bld: 114 mg/dL — ABNORMAL HIGH (ref 70–99)
Potassium: 3.5 mEq/L (ref 3.5–5.1)
SODIUM: 138 meq/L (ref 135–145)

## 2014-04-07 MED ORDER — SIMVASTATIN 20 MG PO TABS: 20.0000 mg | ORAL_TABLET | Freq: Every day | ORAL | Status: DC

## 2014-04-07 NOTE — Patient Instructions (Signed)
AN RX HAS BEEN SENT IN FOR SIMVASTATIN 20 MG 1 TAB EVERY NIGHT AT BEDTIME  LAB WORK TODAY; BMET  Your physician has requested that you have a RIGHT LEG lower extremity venous duplex DX EDEMA; R/O DVT. This test is an ultrasound of the veins in the legs or arms. It looks at venous blood flow that carries blood from the heart to the legs or arms. Allow one hour for a Lower Venous exam. Allow thirty minutes for an Upper Venous exam. There are no restrictions or special instructions.   KEEP YOUR APPT WITH CHF CLINIC 04/08/14  Your physician recommends that you schedule a follow-up appointment in: [place] DR. Harrington Challenger

## 2014-04-07 NOTE — Progress Notes (Signed)
Cardiology Office Note    Date:  04/07/2014   ID:  Betty, [name] 1945-10-18, MRN [id number]  PCP:  Rica Mast, MD  Cardiologist:  Dr. Dorris Carnes      History of Present Illness: Betty [name] is a 68 y.o. female with a hx of NICM with previous EF 25% (improved to normal), HL, gout, glucose intol, normal cors by [facility] in 2003, OSA.  She has a hx of O2 desat on ETT and was referred to pulmonary.  RHC was normal.  Referred to [facility] (Dr. Gilles Chiquito).  RHC with exercise demonstrated significant HTN and desat.  This was felt to reflect diastolic dysfunction.  Lung bx was not suggestive of clear lung pathology.  CT was neg for PE.  Continued management of diastolic CHF has been recommended.     Patient notes LE edema over the last 2 months. This is improved after recent increase in diuretics by telephone. However, her right lower extremity remains swollen, somewhat. She denies any recent hospitalizations or injuries to her legs or recent travels. She has some atypical chest pain. However, she denies exertional chest discomfort. She has chronic dyspnea. She is NYHA 2b-3.  This is unchanged. She denies orthopnea or PND. She denies syncope or palpitations.  Studies:  - RHC (5/14):  RA 9, RV 32/8, PA 30/16/23, PCWP 12, 2.1 WU, CO 5.3, CI 2.7  - Echo (3/14):  EF 55-60%, no RWMA, Gr 1 DD, mild MR  - Nuclear (11/13):  Breast atten, no ischemia, EF 55%; Low Risk    Recent Labs/Images: 01/21/2014: ALT 29; HDL Cholesterol by NMR 60.90; LDL (calc) 50  03/22/2014: Creatinine 0.7; Potassium 3.4*; Pro B Natriuretic peptide (BNP) 19.0    Wt Readings from Last 3 Encounters:  04/07/14 201 lb (91.173 kg)  01/21/14 204 lb (92.534 kg)  01/08/14 203 lb (92.08 kg)     Past Medical History  Diagnosis Date  . Hyperlipidemia   . Gout   . NICM (nonischemic cardiomyopathy) 2002    EF 25%; improved to normal - echo 4/08: EF 50-55%, mild MR, mild LAE, mild TR;    cath 3/03: normal cors, EF 40%    . Glucose intolerance (impaired glucose tolerance)   . History of chicken pox   . Allergic rhinitis     never tested, fall and spring    Current Outpatient Prescriptions  Medication Sig Dispense Refill  . allopurinol (ZYLOPRIM) 100 MG tablet Take 100 mg by mouth as needed.      Marland Kitchen aspirin 81 MG EC tablet Take 81 mg by mouth daily.       . Calcium Citrate-Vitamin D 200-125 MG-UNIT TABS Take 250 Units by mouth 2 (two) times daily.       . carvedilol (COREG) 25 MG tablet TAKE 1 TABLET (25 MG TOTAL) BY MOUTH 2 (TWO) TIMES DAILY.  180 tablet  1  . Cholecalciferol (VITAMIN D-3) 1000 UNITS CAPS Take 1,000 Units by mouth 2 (two) times daily.      . Coenzyme Q10 (CO Q 10) 100 MG CAPS Take 100 mg by mouth daily.      . colchicine 0.6 MG tablet Take 1 tablet (0.6 mg total) by mouth 2 (two) times daily as needed. 1 two times a day to three times a day as needed for gout  60 tablet  6  . dextromethorphan (DELSYM) 30 MG/5ML liquid Take 10 mLs (60 mg total) by mouth as needed.  89 mL    .  dextromethorphan-guaiFENesin (MUCINEX DM) 30-600 MG per 12 hr tablet Take 1 tablet by mouth as needed.      . Fish Oil OIL Take 227 mcg by mouth 2 (two) times daily.      . furosemide (LASIX) 40 MG tablet Take 1 tablet (40 mg total) by mouth daily.  90 tablet  3  . losartan (COZAAR) 50 MG tablet ONE HALF TAB EVERY DAY.  90 tablet  2  . multivitamin (THERAGRAN) per tablet Take 1 tablet by mouth daily.       . Potassium Chloride ER (KLOR-CON 10) 20 MEQ TBCR Take 20 mEq by mouth 2 (two) times daily.  60 tablet  3  . [DISCONTINUED] potassium chloride (K-DUR,KLOR-CON) 10 MEQ tablet Take 1 tablet (10 mEq total) by mouth daily.  90 tablet  3   No current facility-administered medications for this visit.     Allergies:   Amoxicillin; Etodolac; Hctz; Meloxicam; and Sulfonamide derivatives   Social History:  The patient  reports that she has never smoked. She has never used smokeless tobacco. She reports that she does not  drink alcohol or use illicit drugs.   Family History:  The patient's family history includes COPD in her father; Cancer in her maternal grandfather and maternal grandmother; Cancer (age of onset: 38) in her mother; Depression in her sister; Diabetes in her father, paternal grandfather, and sister; Heart disease in her father and paternal grandfather; Hyperlipidemia in her father; Lymphoma in her mother; Meniere's disease in her brother; Pneumonia in her father; Schizophrenia in her daughter; Stroke in her father. There is no history of Colon cancer or Stomach cancer.   ROS:  Please see the history of present illness.      All other systems reviewed and negative.   PHYSICAL EXAM: VS:  BP 110/70  Ht 5' 4.5" (1.638 m)  Wt 201 lb (91.173 kg)  BMI 33.98 kg/m2 Well nourished, well developed, in no acute distress HEENT: normal Neck: no JVD Cardiac:  normal S1, S2; RRR; no murmur Lungs:  clear to auscultation bilaterally, no wheezing, rhonchi or rales Abd: soft, nontender, no hepatomegaly Ext: trace-1+ RLE edemano L LE edema Skin: warm and dry Neuro:  CNs 2-12 intact, no focal abnormalities noted  EKG:  NSR, HR 76, leftward axis, nonspecific ST-T wave changes     ASSESSMENT AND PLAN:  Chronic diastolic heart failure:  Overall, volume appears to be stable. Continue current dose of Lasix. Check followup basic metabolic panel today. She has an appointment with the CHF clinic tomorrow.  Edema - She does have some mild asymmetric edema. I doubt DVT. It is unusual for her right leg to be more swollen. I will obtain a venous duplex to rule out DVT.     Other and unspecified hyperlipidemia:  She had been taken off of simvastatin at some point in the past b/c of Diltiazem.  She is not on Diltiazem any longer. I will resume her Simvastatin.  Obstructive sleep apnea:  Continue CPAP.  CARDIOMYOPATHY:  EF recovered.  Continue BB and ARB.   Disposition:  FU with Dr. Dorris Carnes 3  mos.   Signed, Versie Starks, MHS 04/07/2014 10:51 AM    [facility] [address], [place], [place]  [address] Phone: [phone]; Fax: [phone]

## 2014-04-08 ENCOUNTER — Ambulatory Visit (HOSPITAL_COMMUNITY)
Admission: RE | Admit: 2014-04-08 | Discharge: 2014-04-08 | Disposition: A | Discharge status: Home / Self Care | Payer: Medicare Other | Source: Ambulatory Visit | Attending: Internal Medicine | Admitting: Internal Medicine

## 2014-04-08 VITALS — BP 102/72 | HR 83 | Wt 202.5 lb

## 2014-04-08 DIAGNOSIS — I428 Other cardiomyopathies: Secondary | ICD-10-CM | POA: Insufficient documentation

## 2014-04-08 DIAGNOSIS — Z888 Allergy status to other drugs, medicaments and biological substances status: Secondary | ICD-10-CM | POA: Diagnosis not present

## 2014-04-08 DIAGNOSIS — Z7982 Long term (current) use of aspirin: Secondary | ICD-10-CM | POA: Insufficient documentation

## 2014-04-08 DIAGNOSIS — R0989 Other specified symptoms and signs involving the circulatory and respiratory systems: Secondary | ICD-10-CM

## 2014-04-08 DIAGNOSIS — M109 Gout, unspecified: Secondary | ICD-10-CM | POA: Diagnosis not present

## 2014-04-08 DIAGNOSIS — I5032 Chronic diastolic (congestive) heart failure: Secondary | ICD-10-CM | POA: Diagnosis not present

## 2014-04-08 DIAGNOSIS — Z9981 Dependence on supplemental oxygen: Secondary | ICD-10-CM | POA: Insufficient documentation

## 2014-04-08 DIAGNOSIS — I509 Heart failure, unspecified: Secondary | ICD-10-CM | POA: Diagnosis not present

## 2014-04-08 DIAGNOSIS — Z882 Allergy status to sulfonamides status: Secondary | ICD-10-CM | POA: Diagnosis not present

## 2014-04-08 DIAGNOSIS — E739 Lactose intolerance, unspecified: Secondary | ICD-10-CM | POA: Insufficient documentation

## 2014-04-08 DIAGNOSIS — Z881 Allergy status to other antibiotic agents status: Secondary | ICD-10-CM | POA: Insufficient documentation

## 2014-04-08 DIAGNOSIS — E669 Obesity, unspecified: Secondary | ICD-10-CM | POA: Insufficient documentation

## 2014-04-08 DIAGNOSIS — R0600 Dyspnea, unspecified: Secondary | ICD-10-CM

## 2014-04-08 DIAGNOSIS — J9610 Chronic respiratory failure, unspecified whether with hypoxia or hypercapnia: Secondary | ICD-10-CM | POA: Diagnosis not present

## 2014-04-08 DIAGNOSIS — J309 Allergic rhinitis, unspecified: Secondary | ICD-10-CM | POA: Diagnosis not present

## 2014-04-08 DIAGNOSIS — E785 Hyperlipidemia, unspecified: Secondary | ICD-10-CM | POA: Diagnosis not present

## 2014-04-08 DIAGNOSIS — R0902 Hypoxemia: Secondary | ICD-10-CM

## 2014-04-08 DIAGNOSIS — J984 Other disorders of lung: Secondary | ICD-10-CM | POA: Diagnosis not present

## 2014-04-08 DIAGNOSIS — R0609 Other forms of dyspnea: Secondary | ICD-10-CM | POA: Diagnosis present

## 2014-04-08 DIAGNOSIS — J9611 Chronic respiratory failure with hypoxia: Secondary | ICD-10-CM

## 2014-04-08 NOTE — Progress Notes (Signed)
SATURATION QUALIFICATIONS: (This note is used to comply with regulatory documentation for home oxygen)  Patient Saturations on Room Air at Rest = 90%  Patient Saturations on Room Air while Ambulating = 85%  Patient Saturations on 2 Liters of oxygen while Ambulating = 96%

## 2014-04-08 NOTE — Patient Instructions (Signed)
You have been referred to Cardiac Rehab at [facility], they will contact you to schedule  Please wear your oxygen daily to keep levels greater than 90%  Your physician recommends that you schedule a follow-up appointment in: 3 months

## 2014-04-08 NOTE — Progress Notes (Signed)
Patient ID: Betty [name], female   DOB: 09-Dec-1945, 68 y.o.   MRN: [id number]    Cardiology Office Note  Date:  04/08/2014   ID:  Betty [name], Betty [name] 1946-02-12, MRN [id number]  PCP:  Betty Mast, MD  Cardiologist:  Betty [name]      History of Present Illness: Betty [name] is a 68 y.o. female with a hx of NICM with previous EF 25% (improved to normal), HL, gout, glucose intol, normal cors by Betty [name] in 2003, OSA. Referred by Betty [name] for further evaluation of exertional dyspnea.   She developed increasing shortness of breath over the course of 2 years about 2011 to 2013. She had an extensive Pulmonary work-up at [facility] and has been seen by Betty [name]. Pulmonary function testing showed restrictive lung disease with a markedly depressed DLCO at 35% predicted. CT scanning of her chest showed upper lobe groundglass abnormalities versus air trapping. A home polysomnogram showed an AHI of 12 and multiple desaturation events to 80% independent of apneas. An abg showed resting hypercarbia (pCO2 52), and a MIP/MEP was 36%/28% predicted. A follow up diaphragm study was normal. A repeat CT scan was performed, no pulmonary embolism was seen and radiology commented on atelectasis but no clear intersitial lung disease. Blood work including an HP panel and serologies for connective tissue diseases has been essentially negative (two partial aspergillus bands on HP panel). Two echocardiograms have not shown evidence of pulmonary hypertension. After starting on CPAP with nasal pillows and oxygen with exertion and sleep she started feeling better.  She had an open lung biopsy at [facility] in November of 2014 which showed small amounts of fibrosis around her pulmonary arteries as well as findings consistent with very mild bronchiolitis.  From cardiac standpoint. Resting RHC was normal.  Referred to [facility] (Betty [name]).  RHC with exercise in 3/15 showed marked increase in pulmonary  pressures and PCWP with exercise with no change in PVR. O2 sats down to 77% with exercise. Corrected with O2. PA rest 36/20 (26)  PCWP 15 with exercise PA 78/40 (56) PCWP 28  This was felt to reflect diastolic dysfunction and intrinsic lung disease.  Lung bx was not suggestive of clear lung pathology.  CT was neg for PE.  Continued management of diastolic CHF has been recommended.    Saw Betty [name] yesterday reported LE edema over the last 2 months. This is improved after recent increase in diuretics by telephone. He felt she was euvolemic.  Had some asymmetric swelling in her right lower extremity and u/s ordered.   Remains with significant DOE with ADLs. Wears O2 when she goes out but not at home. Complaint with CPAP. Denies h/o tobacco use but did have second hand smoke from her father. No coughing. Occasional mild wheeze. No CP, orthopnea, PND. Weight stable for past 2 years. Very sedentary.     Studies:  - RHC (5/14):  RA 9, RV 32/8, PA 30/16/23, PCWP 12, 2.1 WU, CO 5.3, CI 2.7  - Echo (3/14):  EF 55-60%, no RWMA, Gr 1 DD, mild MR  - Nuclear (11/13):  Breast atten, no ischemia, EF 55%; Low Risk   - PFTs. [place] 2014: FVC 1.46 L (48% pred), TLC 2.77 L (55% pred) ERV 0.03 (3% pred), DLCO 8.4 (35% pred)  Recent Labs/Images: 01/21/2014: ALT 29; HDL Cholesterol by NMR 60.90; LDL (calc) 50  03/22/2014: Pro B Natriuretic peptide (BNP) 19.0  04/07/2014: Creatinine 0.6; Potassium 3.5  Wt Readings from Last 3 Encounters:  04/08/14 202 lb 8 oz (91.853 kg)  04/07/14 201 lb (91.173 kg)  01/21/14 204 lb (92.534 kg)     Past Medical History  Diagnosis Date  . Hyperlipidemia   . Gout   . NICM (nonischemic cardiomyopathy) 2002    EF 25%; improved to normal - echo 4/08: EF 50-55%, mild MR, mild LAE, mild TR;    cath 3/03: normal cors, EF 40%  . Glucose intolerance (impaired glucose tolerance)   . History of chicken pox   . Allergic rhinitis     never tested, fall and spring    Current  Outpatient Prescriptions  Medication Sig Dispense Refill  . allopurinol (ZYLOPRIM) 100 MG tablet Take 100 mg by mouth as needed.      Marland Kitchen aspirin 81 MG EC tablet Take 81 mg by mouth daily.       . Calcium Citrate-Vitamin D 200-125 MG-UNIT TABS Take 250 Units by mouth 2 (two) times daily.       . carvedilol (COREG) 25 MG tablet TAKE 1 TABLET (25 MG TOTAL) BY MOUTH 2 (TWO) TIMES DAILY.  180 tablet  1  . Cholecalciferol (VITAMIN D-3) 1000 UNITS CAPS Take 1,000 Units by mouth 2 (two) times daily.      . Coenzyme Q10 (CO Q 10) 100 MG CAPS Take 100 mg by mouth daily.      . colchicine 0.6 MG tablet Take 1 tablet (0.6 mg total) by mouth 2 (two) times daily as needed. 1 two times a day to three times a day as needed for gout  60 tablet  6  . dextromethorphan (DELSYM) 30 MG/5ML liquid Take 10 mLs (60 mg total) by mouth as needed.  89 mL    . dextromethorphan-guaiFENesin (MUCINEX DM) 30-600 MG per 12 hr tablet Take 1 tablet by mouth as needed.      . Fish Oil OIL Take 227 mcg by mouth 2 (two) times daily.      . furosemide (LASIX) 40 MG tablet Take 1 tablet (40 mg total) by mouth daily.  90 tablet  3  . losartan (COZAAR) 50 MG tablet ONE HALF TAB EVERY DAY.  90 tablet  2  . multivitamin (THERAGRAN) per tablet Take 1 tablet by mouth daily.       . Potassium Chloride ER (KLOR-CON 10) 20 MEQ TBCR Take 20 mEq by mouth 2 (two) times daily.  60 tablet  3  . simvastatin (ZOCOR) 20 MG tablet Take 1 tablet (20 mg total) by mouth at bedtime.  90 tablet  3  . [DISCONTINUED] potassium chloride (K-DUR,KLOR-CON) 10 MEQ tablet Take 1 tablet (10 mEq total) by mouth daily.  90 tablet  3   No current facility-administered medications for this encounter.     Allergies:   Amoxicillin; Etodolac; Hctz; Meloxicam; and Sulfonamide derivatives   Social History:  The patient  reports that she has never smoked. She has never used smokeless tobacco. She reports that she does not drink alcohol or use illicit drugs.   Family  History:  The patient's family history includes COPD in her father; Cancer in her maternal grandfather and maternal grandmother; Cancer (age of onset: 35) in her mother; Depression in her sister; Diabetes in her father, paternal grandfather, and sister; Heart disease in her father and paternal grandfather; Hyperlipidemia in her father; Lymphoma in her mother; Meniere's disease in her brother; Pneumonia in her father; Schizophrenia in her daughter; Stroke in her father. There is no  history of Colon cancer or Stomach cancer.   ROS:  Please see the history of present illness.      All other systems reviewed and negative.   PHYSICAL EXAM: VS:  BP 102/72  Pulse 83  Wt 202 lb 8 oz (91.853 kg)  SpO2 88% Sats walking into clinic 86% Well nourished, well developed, in no acute distress HEENT: normal Neck: JVP 6 Cardiac:  normal S1, S2; RRR; no murmur Lungs:  clear to auscultation bilaterally, no wheezing, rhonchi or rales Abd: soft, nontender, no hepatomegaly Ext: warm 1+ RLE>LLE edema Neuro:  CNs 2-12 intact, no focal abnormalities noted  EKG:  NSR, HR 76, leftward axis, nonspecific ST-T wave changes     ASSESSMENT AND PLAN:  1. Dyspnea 2. Chronic hypoxic respiratory failure on home O2 3. Chronic diastolic heart failure:   4. Restrictive lung physiology 5. Obesity  She has had a very thorough cardiopulmonary work up for her dyspnea and exertional hypoxia. I have reviewed this in detail and discussed with her. I think her dyspnea is related to restrictive lung disease and severe diastolic dysfunction with exertional pulmonary HTN due to elevated left-side pressures with a normal PVR. Therapeutic options are limited (she is not candidate for selective pulmonary vasodilators with normal PVR).   Recommendations: 1) Manage volume closely. Reinforced need for daily weights and reviewed use of sliding scale diuretics. Keep weight 198-201 2) Wear O2 with all activity to keep sats >= 90% at all  times 3) Refer to pulmonary rehab 4) Lose weight - she is starting [employer] low-carb diet through church program  RTC in 2 months for f/u  Total time spent 45 minutes. Over half that time spent discussing above.    Daniel Bensimhon,MD 1:33 PM

## 2014-04-09 ENCOUNTER — Telehealth: Payer: Self-pay | Admitting: *Deleted

## 2014-04-09 DIAGNOSIS — I5032 Chronic diastolic (congestive) heart failure: Secondary | ICD-10-CM

## 2014-04-09 MED ORDER — POTASSIUM CHLORIDE ER 20 MEQ PO TBCR: 60.0000 meq | EXTENDED_RELEASE_TABLET | Freq: Every day | ORAL | Status: DC

## 2014-04-09 NOTE — Telephone Encounter (Signed)
pt notified lab results and to increase K+ to 60 meq daily, BMET 8/28 when she see's Dr. Gilford Rile, PCP. Pt verbalized understanding to Plan of Care

## 2014-04-13 ENCOUNTER — Encounter (HOSPITAL_COMMUNITY): Payer: Medicare Other

## 2014-04-14 ENCOUNTER — Ambulatory Visit (HOSPITAL_COMMUNITY): Payer: Medicare Other | Attending: Cardiology | Admitting: Cardiology

## 2014-04-14 DIAGNOSIS — R609 Edema, unspecified: Secondary | ICD-10-CM

## 2014-04-14 DIAGNOSIS — M7989 Other specified soft tissue disorders: Secondary | ICD-10-CM | POA: Insufficient documentation

## 2014-04-14 NOTE — Progress Notes (Signed)
Unilateral lower venous duplex performed  

## 2014-04-21 ENCOUNTER — Telehealth (HOSPITAL_COMMUNITY): Payer: Self-pay | Admitting: Vascular Surgery

## 2014-04-21 NOTE — Telephone Encounter (Signed)
Betty [name] Nurse [facility] [place] Faxed another referral for Pulm rehab.. Pt EF is too good for Cardiac rehab.. Please advise

## 2014-04-23 ENCOUNTER — Encounter: Payer: Self-pay | Admitting: Internal Medicine

## 2014-04-23 ENCOUNTER — Ambulatory Visit (INDEPENDENT_AMBULATORY_CARE_PROVIDER_SITE_OTHER): Payer: Medicare Other | Admitting: Internal Medicine

## 2014-04-23 VITALS — BP 106/60 | HR 87 | Ht 64.5 in | Wt 201.2 lb

## 2014-04-23 DIAGNOSIS — I5032 Chronic diastolic (congestive) heart failure: Secondary | ICD-10-CM

## 2014-04-23 DIAGNOSIS — M7662 Achilles tendinitis, left leg: Secondary | ICD-10-CM | POA: Insufficient documentation

## 2014-04-23 DIAGNOSIS — Z23 Encounter for immunization: Secondary | ICD-10-CM

## 2014-04-23 DIAGNOSIS — M7660 Achilles tendinitis, unspecified leg: Secondary | ICD-10-CM

## 2014-04-23 DIAGNOSIS — E119 Type 2 diabetes mellitus without complications: Secondary | ICD-10-CM

## 2014-04-23 LAB — COMPREHENSIVE METABOLIC PANEL
ALT: 27 U/L (ref 0–35)
AST: 27 U/L (ref 0–37)
Albumin: 3.8 g/dL (ref 3.5–5.2)
Alkaline Phosphatase: 75 U/L (ref 39–117)
BUN: 16 mg/dL (ref 6–23)
CALCIUM: 9.4 mg/dL (ref 8.4–10.5)
CHLORIDE: 98 meq/L (ref 96–112)
CO2: 28 meq/L (ref 19–32)
Creatinine, Ser: 0.7 mg/dL (ref 0.4–1.2)
GFR: 85.82 mL/min (ref >60.00)
Glucose, Bld: 149 mg/dL — ABNORMAL HIGH (ref 70–99)
Potassium: 3.4 mEq/L — ABNORMAL LOW (ref 3.5–5.1)
SODIUM: 138 meq/L (ref 135–145)
TOTAL PROTEIN: 8.1 g/dL (ref 6.0–8.3)
Total Bilirubin: 0.6 mg/dL (ref 0.2–1.2)

## 2014-04-23 LAB — HEMOGLOBIN A1C: Hgb A1c MFr Bld: 7.5 % — ABNORMAL HIGH (ref 4.6–6.5)

## 2014-04-23 NOTE — Progress Notes (Signed)
Pre visit review using our clinic review tool, if applicable. No additional management support is needed unless otherwise documented below in the visit note. 

## 2014-04-23 NOTE — Progress Notes (Signed)
Subjective:    Patient ID: Betty [name], female    DOB: Mar 29, 1946, 68 y.o.   MRN: [id number]  HPI 67YO female presents for follow up.  DM - Does not check blood sugars.  Feeling well. Planning to start pulmonary rehab. Weight has been stable 198-201. Limiting water intake to 2L daily. No change in chronic dyspnea.   Concerned about chronic left posterior heel pain and deformity. Was seen by podiatry in the past for this. Told she will need surgical repair of achilles tendon. Pain is made worse by exercise such as walking with tennis shoes on. She must wear shoes with open back.  Review of Systems  Constitutional: Negative for fever, chills, appetite change, fatigue and unexpected weight change.  Eyes: Negative for visual disturbance.  Respiratory: Positive for shortness of breath. Negative for cough.   Cardiovascular: Negative for chest pain and leg swelling.  Gastrointestinal: Negative for abdominal pain.  Musculoskeletal: Positive for arthralgias and myalgias.  Skin: Negative for color change and rash.  Hematological: Negative for adenopathy. Does not bruise/bleed easily.  Psychiatric/Behavioral: Negative for dysphoric mood. The patient is not nervous/anxious.        Objective:    BP 106/60  Pulse 87  Ht 5' 4.5" (1.638 m)  Wt 201 lb 4 oz (91.286 kg)  BMI 34.02 kg/m2  SpO2 93% Physical Exam  Constitutional: She is oriented to person, place, and time. She appears well-developed and well-nourished. No distress.  HENT:  Head: Normocephalic and atraumatic.  Right Ear: External ear normal.  Left Ear: External ear normal.  Nose: Nose normal.  Mouth/Throat: Oropharynx is clear and moist. No oropharyngeal exudate.  Eyes: Conjunctivae are normal. Pupils are equal, round, and reactive to light. Right eye exhibits no discharge. Left eye exhibits no discharge. No scleral icterus.  Neck: Normal range of motion. Neck supple. No tracheal deviation present. No thyromegaly present.    Cardiovascular: Normal rate, regular rhythm, normal heart sounds and intact distal pulses.  Exam reveals no gallop and no friction rub.   No murmur heard. Pulmonary/Chest: Effort normal. No accessory muscle usage. Not tachypneic. No respiratory distress. She has no decreased breath sounds. She has no wheezes. She has no rhonchi. She has no rales. She exhibits no tenderness.  Course breath sounds bilaterally  Musculoskeletal: Normal range of motion. She exhibits no edema and no tenderness.       Feet:  Lymphadenopathy:    She has no cervical adenopathy.  Neurological: She is alert and oriented to person, place, and time. No cranial nerve deficit. She exhibits normal muscle tone. Coordination normal.  Skin: Skin is warm and dry. No rash noted. She is not diaphoretic. No erythema. No pallor.  Psychiatric: She has a normal mood and affect. Her behavior is normal. Judgment and thought content normal.          Assessment & Plan:   Problem List Items Addressed This Visit     Unprioritized   Chronic diastolic heart failure     Symptomatically doing well. Appears euvolemic today. Will check electrolytes with labs.    Relevant Medications      furosemide (LASIX) 40 MG tablet   Diabetes mellitus type 2, controlled - Primary     Will check A1c with labs today. Foot exam normal except as noted today.    Relevant Orders      Comprehensive metabolic panel      Hemoglobin A1c   Left Achilles tendinitis     Will set  up podiatry evaluation.    Relevant Orders      Ambulatory referral to Podiatry    Other Visit Diagnoses   Need for prophylactic vaccination and inoculation against influenza        Relevant Orders       Flu Vaccine QUAD 36+ mos PF IM (Fluarix Quad PF) (Completed)        Return in about 3 months (around 07/24/2014) for Recheck of Diabetes.

## 2014-04-23 NOTE — Assessment & Plan Note (Signed)
Will set up podiatry evaluation.

## 2014-04-23 NOTE — Patient Instructions (Signed)
Labs today

## 2014-04-23 NOTE — Assessment & Plan Note (Signed)
Symptomatically doing well. Appears euvolemic today. Will check electrolytes with labs.

## 2014-04-23 NOTE — Assessment & Plan Note (Signed)
Will check A1c with labs today. Foot exam normal except as noted today.

## 2014-04-25 MED ORDER — METFORMIN HCL 500 MG PO TABS: 500.0000 mg | ORAL_TABLET | Freq: Two times a day (BID) | ORAL | Status: DC

## 2014-04-27 NOTE — Telephone Encounter (Signed)
Order for pulm rehab faxed

## 2014-04-30 ENCOUNTER — Encounter: Payer: Self-pay | Admitting: Pulmonary Disease

## 2014-04-30 MED ORDER — MOMETASONE FUROATE 50 MCG/ACT NA SUSP: 2.0000 | Freq: Every day | NASAL | Status: DC

## 2014-05-04 ENCOUNTER — Telehealth (HOSPITAL_COMMUNITY): Payer: Self-pay | Admitting: Vascular Surgery

## 2014-05-04 DIAGNOSIS — I5032 Chronic diastolic (congestive) heart failure: Secondary | ICD-10-CM

## 2014-05-04 NOTE — Telephone Encounter (Deleted)
Refill

## 2014-05-04 NOTE — Telephone Encounter (Signed)
Pt need prescription that reflects new dosage changes... 40 mg twice daily

## 2014-05-05 NOTE — Telephone Encounter (Signed)
Pt called to request refill to reflect reflect recent increase in Lasix Pt reports she was told to increase Lasix to 40 mg BID and has been taking this dose since last office visit with CHF clinic 04/08/14 CMET on 8/28 K= 3.4 Cr= 0.7  Please advise if pt should continue this dose

## 2014-05-05 NOTE — Telephone Encounter (Signed)
Ok to continue. Increase kcl to 40/20  Recommend repeat BMET in 1-2 weeks with PCP.   Mickelle Goupil,MD 4:44 PM

## 2014-05-06 MED ORDER — FUROSEMIDE 40 MG PO TABS: 40.0000 mg | ORAL_TABLET | Freq: Two times a day (BID) | ORAL | Status: DC

## 2014-05-06 MED ORDER — POTASSIUM CHLORIDE ER 20 MEQ PO TBCR: EXTENDED_RELEASE_TABLET | ORAL | Status: DC

## 2014-05-07 ENCOUNTER — Encounter: Payer: Self-pay | Admitting: Internal Medicine

## 2014-05-07 NOTE — Telephone Encounter (Signed)
Pt aware rx changed to dose she is taking at home Aware she needs labs with PCP before the end of the month Order placed

## 2014-05-07 NOTE — Addendum Note (Signed)
Addended by: Kerry Dory on: 05/07/2014 03:42 PM   Modules accepted: Orders

## 2014-05-10 ENCOUNTER — Ambulatory Visit: Payer: Self-pay | Admitting: Podiatry

## 2014-05-19 ENCOUNTER — Other Ambulatory Visit (INDEPENDENT_AMBULATORY_CARE_PROVIDER_SITE_OTHER): Payer: Medicare Other

## 2014-05-19 DIAGNOSIS — I5032 Chronic diastolic (congestive) heart failure: Secondary | ICD-10-CM

## 2014-05-20 LAB — BASIC METABOLIC PANEL
BUN: 14 mg/dL (ref 6–23)
CO2: 34 mEq/L — ABNORMAL HIGH (ref 19–32)
Calcium: 9.7 mg/dL (ref 8.4–10.5)
Chloride: 97 mEq/L (ref 96–112)
Creatinine, Ser: 0.8 mg/dL (ref 0.4–1.2)
GFR: 81.85 mL/min (ref >60.00)
Glucose, Bld: 101 mg/dL — ABNORMAL HIGH (ref 70–99)
POTASSIUM: 4 meq/L (ref 3.5–5.1)
SODIUM: 140 meq/L (ref 135–145)

## 2014-05-24 LAB — HM DIABETES EYE EXAM

## 2014-05-27 ENCOUNTER — Encounter: Payer: Self-pay | Admitting: Internal Medicine

## 2014-06-07 ENCOUNTER — Encounter: Payer: Self-pay | Admitting: Internal Medicine

## 2014-06-07 ENCOUNTER — Encounter: Payer: Self-pay | Admitting: *Deleted

## 2014-06-15 ENCOUNTER — Other Ambulatory Visit (HOSPITAL_COMMUNITY): Payer: Self-pay | Admitting: Cardiology

## 2014-06-15 DIAGNOSIS — I5032 Chronic diastolic (congestive) heart failure: Secondary | ICD-10-CM

## 2014-06-15 MED ORDER — FUROSEMIDE 40 MG PO TABS: 80.0000 mg | ORAL_TABLET | Freq: Two times a day (BID) | ORAL | Status: DC

## 2014-07-12 ENCOUNTER — Ambulatory Visit (INDEPENDENT_AMBULATORY_CARE_PROVIDER_SITE_OTHER): Payer: Medicare Other | Admitting: Internal Medicine

## 2014-07-12 ENCOUNTER — Encounter: Payer: Self-pay | Admitting: Internal Medicine

## 2014-07-12 VITALS — BP 102/70 | HR 74 | Ht 64.5 in | Wt 193.0 lb

## 2014-07-12 DIAGNOSIS — I5032 Chronic diastolic (congestive) heart failure: Secondary | ICD-10-CM

## 2014-07-12 DIAGNOSIS — J984 Other disorders of lung: Secondary | ICD-10-CM

## 2014-07-12 DIAGNOSIS — R0600 Dyspnea, unspecified: Secondary | ICD-10-CM

## 2014-07-12 DIAGNOSIS — G4730 Sleep apnea, unspecified: Secondary | ICD-10-CM

## 2014-07-12 NOTE — Progress Notes (Signed)
Cardiology Office Note    Date:  07/12/2014   ID:  Betty [name], Betty [name] Aug 02, 1946, MRN [id number]   History of Present Illness: Betty [name] is a 68 y.o. female with a hx of NICM with previous EF 25% (improved to normal), HL, gout, glucose intol, normal cors by [facility] in 2003, OSA.  She has a hx of O2 desat on ETT and was referred to pulmonary.  RHC was normal.  Referred to [facility] (Dr. Gilles Chiquito).  RHC with exercise demonstrated significant HTN and desat.  This was felt to reflect diastolic dysfunction.  Lung bx was not suggestive of clear lung pathology.  CT was neg for PE.  Continued management of diastolic CHF has been recommended.     Patient notes LE edema over the last 2 months. This is improved after recent increase in diuretics by telephone. However, her right lower extremity remains swollen, somewhat. She denies any recent hospitalizations or injuries to her legs or recent travels. She has some atypical chest pain. However, she denies exertional chest discomfort. She has chronic dyspnea. She is NYHA 2b-3.  This is unchanged. She denies orthopnea or PND. She denies syncope or palpitations.  Patinet was last seen by Kathleen Argue at the end of the summer   Studies:  - [place] (5/14):  RA 9, RV 32/8, PA 30/16/23, PCWP 12, 2.1 WU, CO 5.3, CI 2.7  - Echo (3/14):  EF 55-60%, no RWMA, Gr 1 DD, mild MR  - Nuclear (11/13):  Breast atten, no ischemia, EF 55%; Low Risk    Recent Labs/Images: 01/21/2014: HDL Cholesterol by NMR 60.90; LDL (calc) 50 03/22/2014: Pro B Natriuretic peptide (BNP) 19.0 04/23/2014: ALT 27 05/19/2014: Creatinine 0.8; Potassium 4.0   Wt Readings from Last 3 Encounters:  04/23/14 201 lb 4 oz (91.286 kg)  04/08/14 202 lb 8 oz (91.853 kg)  04/07/14 201 lb (91.173 kg)     Past Medical History  Diagnosis Date  . Hyperlipidemia   . Gout   . NICM (nonischemic cardiomyopathy) 2002    EF 25%; improved to normal - echo 4/08: EF 50-55%, mild MR, mild LAE, mild TR;    cath 3/03:  normal cors, EF 40%  . Glucose intolerance (impaired glucose tolerance)   . History of chicken pox   . Allergic rhinitis     never tested, fall and spring    Current Outpatient Prescriptions  Medication Sig Dispense Refill  . allopurinol (ZYLOPRIM) 100 MG tablet Take 100 mg by mouth as needed.    Marland Kitchen aspirin 81 MG EC tablet Take 81 mg by mouth daily.     . Calcium Citrate-Vitamin D 200-125 MG-UNIT TABS Take 250 Units by mouth 2 (two) times daily.     . carvedilol (COREG) 25 MG tablet TAKE 1 TABLET (25 MG TOTAL) BY MOUTH 2 (TWO) TIMES DAILY. 180 tablet 1  . Cholecalciferol (VITAMIN D-3) 1000 UNITS CAPS Take 1,000 Units by mouth 2 (two) times daily.    . Coenzyme Q10 (CO Q 10) 100 MG CAPS Take 100 mg by mouth daily.    . colchicine 0.6 MG tablet Take 1 tablet (0.6 mg total) by mouth 2 (two) times daily as needed. 1 two times a day to three times a day as needed for gout 60 tablet 6  . Fish Oil OIL Take 227 mcg by mouth 2 (two) times daily.    . furosemide (LASIX) 40 MG tablet Take 2 tablets (80 mg total) by mouth 2 (two) times daily.  120 tablet 6  . losartan (COZAAR) 50 MG tablet ONE HALF TAB EVERY DAY. 90 tablet 2  . metFORMIN (GLUCOPHAGE) 500 MG tablet Take 1 tablet (500 mg total) by mouth 2 (two) times daily with a meal. 180 tablet 3  . mometasone (NASONEX) 50 MCG/ACT nasal spray Place 2 sprays into the nose daily. 17 g 11  . multivitamin (THERAGRAN) per tablet Take 1 tablet by mouth daily.     . Potassium Chloride ER 20 MEQ TBCR Take 40 MeQ in the AM and 20 MeQ in the PM 90 tablet 11  . simvastatin (ZOCOR) 20 MG tablet Take 1 tablet (20 mg total) by mouth at bedtime. 90 tablet 3  . [DISCONTINUED] potassium chloride (K-DUR,KLOR-CON) 10 MEQ tablet Take 1 tablet (10 mEq total) by mouth daily. 90 tablet 3   No current facility-administered medications for this visit.       Chronic diastolic heart failure:  Overall, volume appears to be stable. Continue current dose of Lasix. Check  followup basic metabolic panel today. She has an appointment with the CHF clinic tomorrow.  Edema - She does have some mild asymmetric edema. I doubt DVT. It is unusual for her right leg to be more swollen. I will obtain a venous duplex to rule out DVT.     Other and unspecified hyperlipidemia:  She had been taken off of simvastatin at some point in the past b/c of Diltiazem.  She is not on Diltiazem any longer. I will resume her Simvastatin.  Obstructive sleep apnea:  Continue CPAP.  HPI Betty [name] is a 68 y.o. female with a hx of NICM with previous EF 25% (improved to normal), HL, gout, glucose intol, normal cors by [facility] in 2003, OSA.  She developed increasing shortness of breath over the course of 2 years about 2011 to 2013. She had an extensive Pulmonary work-up at [facility] and has been seen by Dr. Lake Bells. Pulmonary function testing showed restrictive lung disease with a markedly depressed DLCO at 35% predicted. CT scanning of her chest showed upper lobe groundglass abnormalities versus air trapping. A home polysomnogram showed an AHI of 12 and multiple desaturation events to 80% independent of apneas. An abg showed resting hypercarbia (pCO2 52), and a MIP/MEP was 36%/28% predicted. A follow up diaphragm study was normal. A repeat CT scan was performed, no pulmonary embolism was seen and radiology commented on atelectasis but no clear intersitial lung disease. Blood work including an HP panel and serologies for connective tissue diseases has been essentially negative (two partial aspergillus bands on HP panel). Two echocardiograms have not shown evidence of pulmonary hypertension. After starting on CPAP with nasal pillows and oxygen with exertion and sleep she started feeling better. She had an open lung biopsy at [facility] in November of 2014 which showed small amounts of fibrosis around her pulmonary arteries as well as findings consistent with very mild bronchiolitis.  From  cardiac standpoint. Resting RHC was normal. Referred to [facility] (Dr. Gilles Chiquito). RHC with exercise in 3/15 showed marked increase in pulmonary pressures and PCWP with exercise with no change in PVR. O2 sats down to 77% with exercise. Corrected with O2. PA rest 36/20 (26) PCWP 15 with exercise PA 78/40 (56) PCWP 28 This was felt to reflect diastolic dysfunction and intrinsic lung disease. Lung bx was not suggestive of clear lung pathology. CT was neg for PE.  The patient was seen by D Bensimhon earlier this fall.  Again, felt that symptoms due  to a combination of restrictive lung disease and signif diastolic dysfunction. Recomm careful following of salt and fluid intake as well as titration of Lasix  She is now taking lasix BID and watching intake  Now she says she feels better than she has felt in a long time.  Breathing is pretty good  No signif edema   Allergies  Allergen Reactions  . Amoxicillin     REACTION: RASH  . Etodolac     Bloody stool  . Hctz [Hydrochlorothiazide]     Increases gout flare  . Meloxicam     constipation  . Sulfonamide Derivatives     REACTION: RASH    Current Outpatient Prescriptions  Medication Sig Dispense Refill  . allopurinol (ZYLOPRIM) 100 MG tablet Take 100 mg by mouth as needed.    Marland Kitchen aspirin 81 MG EC tablet Take 81 mg by mouth daily.     . Calcium Citrate-Vitamin D 200-125 MG-UNIT TABS Take 250 Units by mouth 2 (two) times daily.     . carvedilol (COREG) 25 MG tablet TAKE 1 TABLET (25 MG TOTAL) BY MOUTH 2 (TWO) TIMES DAILY. 180 tablet 1  . Cholecalciferol (VITAMIN D-3) 1000 UNITS CAPS Take 1,000 Units by mouth 2 (two) times daily.    . Coenzyme Q10 (CO Q 10) 100 MG CAPS Take 100 mg by mouth daily.    . colchicine 0.6 MG tablet Take 1 tablet (0.6 mg total) by mouth 2 (two) times daily as needed. 1 two times a day to three times a day as needed for gout 60 tablet 6  . Fish Oil OIL Take 227 mcg by mouth 2 (two) times daily.    . furosemide (LASIX) 40 MG  tablet Take 2 tablets (80 mg total) by mouth 2 (two) times daily. 120 tablet 6  . losartan (COZAAR) 50 MG tablet ONE HALF TAB EVERY DAY. 90 tablet 2  . metFORMIN (GLUCOPHAGE) 500 MG tablet Take 1 tablet (500 mg total) by mouth 2 (two) times daily with a meal. 180 tablet 3  . mometasone (NASONEX) 50 MCG/ACT nasal spray Place 2 sprays into the nose daily. 17 g 11  . multivitamin (THERAGRAN) per tablet Take 1 tablet by mouth daily.     . Potassium Chloride ER 20 MEQ TBCR Take 40 MeQ in the AM and 20 MeQ in the PM 90 tablet 11  . simvastatin (ZOCOR) 20 MG tablet Take 1 tablet (20 mg total) by mouth at bedtime. 90 tablet 3  . [DISCONTINUED] potassium chloride (K-DUR,KLOR-CON) 10 MEQ tablet Take 1 tablet (10 mEq total) by mouth daily. 90 tablet 3   No current facility-administered medications for this visit.    Past Medical History  Diagnosis Date  . Hyperlipidemia   . Gout   . NICM (nonischemic cardiomyopathy) 2002    EF 25%; improved to normal - echo 4/08: EF 50-55%, mild MR, mild LAE, mild TR;    cath 3/03: normal cors, EF 40%  . Glucose intolerance (impaired glucose tolerance)   . History of chicken pox   . Allergic rhinitis     never tested, fall and spring    Past Surgical History  Procedure Laterality Date  . Nsvd      x 2  . Cystectomy  1996    l breast  . Choleystectomy  02/2002  . Vaginal delivery      x2  . Tubal ligation  1975  . Dilation and curettage of uterus  2011    Dr. Hulan Fray  at [place] abortion    . Breast biopsy  80's    Cyst removed    Family History  Problem Relation Age of Onset  . Lymphoma Mother   . Cancer Mother 56    Lymphoma  . COPD Father     was a smoker  . Stroke Father   . Pneumonia Father   . Diabetes Father   . Hyperlipidemia Father   . Heart disease Father   . Diabetes Sister   . Depression Sister   . Meniere's disease Brother   . Diabetes Paternal Grandfather   . Heart disease Paternal Grandfather   .  Schizophrenia Daughter   . Cancer Maternal Grandmother     Bladder  . Cancer Maternal Grandfather     leukemia  . Colon cancer Neg Hx   . Stomach cancer Neg Hx   . Heart attack Father   . Hypertension Brother   . Hypertension Father     History   Social History  . Marital Status: Married    Spouse Name: N/A    Number of Children: 2  . Years of Education: N/A   Occupational History  . Retired Pharmacist, hospital   . Works at [employer]  . Smoking status: Never Smoker   . Smokeless tobacco: Never Used  . Alcohol Use: No  . Drug Use: No  . Sexual Activity: No   Other Topics Concern  . Not on file   Social History Narrative   Lives in [place] with husband. 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 No pets.    Review of Systems:  All systems reviewed.  They are negative to the above problem except as previously stated.  Vital Signs: BP 102/70 mmHg  Pulse 74  Ht 5' 4.5" (1.638 m)  Wt 193 lb (87.544 kg)  BMI 32.63 kg/m2  SpO2 93%  Physical Exam Patinet is in NAD   HEENT:  Normocephalic, atraumatic. EOMI, PERRLA.  Neck: JVP is normal.  No bruits.  Lungs: clear to auscultation. No rales no wheezes.  Heart: Regular rate and rhythm. Normal S1, S2. No S3.   No significant murmurs. PMI not displaced.  Abdomen:  Supple, nontender. Normal bowel sounds. No masses. No hepatomegaly.  Extremities:   Good distal pulses throughout. No lower extremity edema.  Musculoskeletal :moving all extremities.  Neuro:   alert and oriented x3.  CN II-XII grossly intact.   Assessment and Plan: 1.  Chronic diastolic CHF   Patient appears to be feeling good today  Volume status looks good on exam  Labs in Sept on current dose of lasix were OK I would keep on same reigmen.  She has f/u with D Bensimhon in December  2.  Pulm  Keep on CPAP    3.  Hx systolic CHF  LVEF normalized  Keep on medical Rx

## 2014-07-12 NOTE — Patient Instructions (Signed)
Your physician recommends that you continue on your current medications as directed. Please refer to the Current Medication list given to you today. Your physician recommends that you schedule a follow-up appointment as needed with Dr. Harrington Challenger. Keep your scheduled follow up with the [facility], Dr. Haroldine Laws.

## 2014-07-19 ENCOUNTER — Ambulatory Visit (INDEPENDENT_AMBULATORY_CARE_PROVIDER_SITE_OTHER): Payer: Medicare Other | Admitting: Internal Medicine

## 2014-07-19 ENCOUNTER — Encounter: Payer: Self-pay | Admitting: Internal Medicine

## 2014-07-19 VITALS — BP 117/74 | HR 73 | Temp 97.8°F | Ht 64.5 in | Wt 193.5 lb

## 2014-07-19 DIAGNOSIS — H6592 Unspecified nonsuppurative otitis media, left ear: Secondary | ICD-10-CM

## 2014-07-19 DIAGNOSIS — H6590 Unspecified nonsuppurative otitis media, unspecified ear: Secondary | ICD-10-CM | POA: Insufficient documentation

## 2014-07-19 NOTE — Progress Notes (Signed)
Pre visit review using our clinic review tool, if applicable. No additional management support is needed unless otherwise documented below in the visit note. 

## 2014-07-19 NOTE — Patient Instructions (Signed)
We will set up ENT evaluation today.  Follow up here in 4 weeks.

## 2014-07-19 NOTE — Assessment & Plan Note (Signed)
Left middle ear effusion with gas bubbles behind TM. Will set up ENT evaluation. Question if she may need a TM tube.

## 2014-07-19 NOTE — Progress Notes (Signed)
   Subjective:    Patient ID: Betty [name], female    DOB: 05-04-1946, 68 y.o.   MRN: [id number]  HPI 68YO female presents for acute visit.  Flew to [facility] on Nov 2nd. Has not been able to hear out of both ears, left worse than right, ever since. No pain in ears.  No ringing in ears. No recent illness.   Review of Systems  Constitutional: Negative for fever, chills and unexpected weight change.  HENT: Positive for hearing loss. Negative for congestion, ear discharge, ear pain, facial swelling, mouth sores, nosebleeds, postnasal drip, rhinorrhea, sinus pressure, sneezing, sore throat, tinnitus, trouble swallowing and voice change.   Eyes: Negative for pain, discharge, redness and visual disturbance.  Respiratory: Negative for cough, chest tightness, shortness of breath, wheezing and stridor.   Cardiovascular: Negative for chest pain, palpitations and leg swelling.  Musculoskeletal: Negative for myalgias, arthralgias, neck pain and neck stiffness.  Skin: Negative for color change and rash.  Neurological: Negative for dizziness, weakness, light-headedness and headaches.  Hematological: Negative for adenopathy.       Objective:    BP 117/74 mmHg  Pulse 73  Temp(Src) 97.8 F (36.6 C) (Oral)  Ht 5' 4.5" (1.638 m)  Wt 193 lb 8 oz (87.771 kg)  BMI 32.71 kg/m2  SpO2 93% Physical Exam  Constitutional: She is oriented to person, place, and time. She appears well-developed and well-nourished. No distress.  HENT:  Head: Normocephalic and atraumatic.  Right Ear: Tympanic membrane, external ear and ear canal normal.  Left Ear: External ear normal. Tympanic membrane is erythematous and bulging. A middle ear effusion is present.  Ears:  Nose: Nose normal.  Mouth/Throat: Oropharynx is clear and moist.  Eyes: Conjunctivae are normal. Pupils are equal, round, and reactive to light. Right eye exhibits no discharge. Left eye exhibits no discharge. No scleral icterus.  Neck: Normal range of  motion. Neck supple. No tracheal deviation present. No thyromegaly present.  Pulmonary/Chest: Effort normal. No accessory muscle usage. No tachypnea. She has no decreased breath sounds. She has no rhonchi.  Musculoskeletal: Normal range of motion. She exhibits no edema or tenderness.  Lymphadenopathy:    She has no cervical adenopathy.  Neurological: She is alert and oriented to person, place, and time. No cranial nerve deficit. She exhibits normal muscle tone. Coordination normal.  Skin: Skin is warm and dry. No rash noted. She is not diaphoretic. No erythema. No pallor.  Psychiatric: She has a normal mood and affect. Her behavior is normal. Judgment and thought content normal.          Assessment & Plan:   Problem List Items Addressed This Visit      Unprioritized   Middle ear effusion - Primary    Left middle ear effusion with gas bubbles behind TM. Will set up ENT evaluation. Question if she may need a TM tube.    Relevant Orders      Ambulatory referral to ENT       Return in about 4 weeks (around 08/16/2014) for Recheck.

## 2014-08-05 ENCOUNTER — Ambulatory Visit (HOSPITAL_COMMUNITY)
Admission: RE | Admit: 2014-08-05 | Discharge: 2014-08-05 | Disposition: A | Discharge status: Home / Self Care | Payer: Medicare Other | Source: Ambulatory Visit | Attending: Internal Medicine | Admitting: Internal Medicine

## 2014-08-05 ENCOUNTER — Encounter (HOSPITAL_COMMUNITY): Payer: Self-pay

## 2014-08-05 VITALS — BP 108/60 | HR 88 | Wt 191.8 lb

## 2014-08-05 DIAGNOSIS — Z9981 Dependence on supplemental oxygen: Secondary | ICD-10-CM | POA: Diagnosis not present

## 2014-08-05 DIAGNOSIS — E669 Obesity, unspecified: Secondary | ICD-10-CM | POA: Insufficient documentation

## 2014-08-05 DIAGNOSIS — R0600 Dyspnea, unspecified: Secondary | ICD-10-CM

## 2014-08-05 DIAGNOSIS — J9611 Chronic respiratory failure with hypoxia: Secondary | ICD-10-CM | POA: Insufficient documentation

## 2014-08-05 DIAGNOSIS — I5032 Chronic diastolic (congestive) heart failure: Secondary | ICD-10-CM

## 2014-08-05 LAB — BASIC METABOLIC PANEL
ANION GAP: 13 (ref 5–15)
BUN: 11 mg/dL (ref 6–23)
CHLORIDE: 96 meq/L (ref 96–112)
CO2: 31 mEq/L (ref 19–32)
CREATININE: 0.61 mg/dL (ref 0.50–1.10)
Calcium: 9.8 mg/dL (ref 8.4–10.5)
GFR calc non Af Amer: >90 mL/min (ref >90)
Glucose, Bld: 136 mg/dL — ABNORMAL HIGH (ref 70–99)
Potassium: 3.4 mEq/L — ABNORMAL LOW (ref 3.7–5.3)
Sodium: 140 mEq/L (ref 137–147)

## 2014-08-05 LAB — PRO B NATRIURETIC PEPTIDE: Pro B Natriuretic peptide (BNP): 36.4 pg/mL (ref 0–125)

## 2014-08-05 MED ORDER — FUROSEMIDE 40 MG PO TABS: 40.0000 mg | ORAL_TABLET | Freq: Two times a day (BID) | ORAL | Status: DC

## 2014-08-05 NOTE — Patient Instructions (Signed)
Labs today  Your physician recommends that you schedule a follow-up appointment in: 4 months

## 2014-08-05 NOTE — Progress Notes (Signed)
Patient ID: Betty [name], female   DOB: 26-Nov-1945, 68 y.o.   MRN: [id number]   Cardiology Office Note  Date:  08/05/2014   ID:  Betty [name] 1946/02/07, MRN [id number]  PCP:  Rica Mast, MD  Cardiologist:  Dr. Dorris Carnes      History of Present Illness: Betty [name] is a 68 y.o. female with a hx of NICM with previous EF 25% (improved to normal), HL, gout, glucose intol, normal cors by [facility] in 2003, OSA. Referred by Dr. Harrington Challenger for further evaluation of exertional dyspnea.   She developed increasing shortness of breath over the course of 2 years about 2011 to 2013. She had an extensive Pulmonary work-up at [facility] and has been seen by Dr. Lake Bells. Pulmonary function testing showed restrictive lung disease with a markedly depressed DLCO at 35% predicted. CT scanning of her chest showed upper lobe groundglass abnormalities versus air trapping. A home polysomnogram showed an AHI of 12 and multiple desaturation events to 80% independent of apneas. An abg showed resting hypercarbia (pCO2 52), and a MIP/MEP was 36%/28% predicted. A follow up diaphragm study was normal. A repeat CT scan was performed, no pulmonary embolism was seen and radiology commented on atelectasis but no clear intersitial lung disease. Blood work including an HP panel and serologies for connective tissue diseases has been essentially negative (two partial aspergillus bands on HP panel). Two echocardiograms have not shown evidence of pulmonary hypertension. After starting on CPAP with nasal pillows and oxygen with exertion and sleep she started feeling better.  She had an open lung biopsy at [facility] in November of 2014 which showed small amounts of fibrosis around her pulmonary arteries as well as findings consistent with very mild bronchiolitis.  Resting RHC was normal.  Referred to [facility] (Dr. Gilles Chiquito).  RHC with exercise in 3/15 showed marked increase in pulmonary pressures and PCWP with  exercise with no change in PVR. O2 sats down to 77% with exercise. Corrected with O2. PA rest 36/20 (26)  PCWP 15 with exercise PA 78/40 (56) PCWP 28  This was felt to reflect diastolic dysfunction and intrinsic lung disease.  Lung bx was not suggestive of clear lung pathology.  CT was neg for PE.  Continued management of diastolic CHF has been recommended.     Currently stable.  She is using CPAP with oxygen.  She has been going to pulmonary rehab at [facility]. She can walk 1.5 miles on a treadmill without problem.  She can walk up steps without a problem.   Studies:  - RHC (5/14):  RA 9, RV 32/8, PA 30/16/23, PCWP 12, 2.1 WU, CO 5.3, CI 2.7  - Echo (3/14):  EF 55-60%, no RWMA, Gr 1 DD, mild MR  - Nuclear (11/13):  Breast atten, no ischemia, EF 55%; Low Risk   - PFTs. [place] 2014: FVC 1.46 L (48% pred), TLC 2.77 L (55% pred) ERV 0.03 (3% pred), DLCO 8.4 (35% pred)  Recent Labs/Images: 01/21/2014: HDL Cholesterol by NMR 60.90; LDL (calc) 50 04/23/2014: ALT 27 08/05/2014: Creatinine 0.61; Potassium 3.4*; Pro B Natriuretic peptide (BNP) 36.4   Wt Readings from Last 3 Encounters:  08/05/14 191 lb 12.8 oz (87 kg)  07/19/14 193 lb 8 oz (87.771 kg)  07/12/14 193 lb (87.544 kg)     Past Medical History  Diagnosis Date  . Hyperlipidemia   . Gout   . NICM (nonischemic cardiomyopathy) 2002    EF 25%; improved to normal -  echo 4/08: EF 50-55%, mild MR, mild LAE, mild TR;    cath 3/03: normal cors, EF 40%  . Glucose intolerance (impaired glucose tolerance)   . History of chicken pox   . Allergic rhinitis     never tested, fall and spring    Current Outpatient Prescriptions  Medication Sig Dispense Refill  . aspirin 81 MG EC tablet Take 81 mg by mouth daily.     . Calcium Citrate-Vitamin D 200-125 MG-UNIT TABS Take 250 Units by mouth 2 (two) times daily.     . carvedilol (COREG) 25 MG tablet TAKE 1 TABLET (25 MG TOTAL) BY MOUTH 2 (TWO) TIMES DAILY. 180 tablet 1  . Cholecalciferol (VITAMIN D-3) 1000  UNITS CAPS Take 1,000 Units by mouth 2 (two) times daily.    . Coenzyme Q10 (CO Q 10) 100 MG CAPS Take 100 mg by mouth daily.    . Fish Oil OIL Take 227 mcg by mouth 2 (two) times daily.    . furosemide (LASIX) 40 MG tablet Take 1 tablet (40 mg total) by mouth 2 (two) times daily. 120 tablet 6  . losartan (COZAAR) 50 MG tablet ONE HALF TAB EVERY DAY. 90 tablet 2  . metFORMIN (GLUCOPHAGE) 500 MG tablet Take 1 tablet (500 mg total) by mouth 2 (two) times daily with a meal. 180 tablet 3  . mometasone (NASONEX) 50 MCG/ACT nasal spray Place 2 sprays into the nose daily. 17 g 11  . multivitamin (THERAGRAN) per tablet Take 1 tablet by mouth daily.     . Potassium Chloride ER 20 MEQ TBCR Take 40 MeQ in the AM and 20 MeQ in the PM 90 tablet 11  . simvastatin (ZOCOR) 20 MG tablet Take 1 tablet (20 mg total) by mouth at bedtime. 90 tablet 3  . allopurinol (ZYLOPRIM) 100 MG tablet Take 100 mg by mouth as needed.    . colchicine 0.6 MG tablet Take 1 tablet (0.6 mg total) by mouth 2 (two) times daily as needed. 1 two times a day to three times a day as needed for gout (Patient not taking: Reported on 08/05/2014) 60 tablet 6  . [DISCONTINUED] potassium chloride (K-DUR,KLOR-CON) 10 MEQ tablet Take 1 tablet (10 mEq total) by mouth daily. 90 tablet 3   No current facility-administered medications for this encounter.     Allergies:   Amoxicillin; Etodolac; Hctz; Meloxicam; and Sulfonamide derivatives   Social History:  The patient  reports that she has never smoked. She has never used smokeless tobacco. She reports that she does not drink alcohol or use illicit drugs.   Family History:  The patient's family history includes COPD in her father; Cancer in her maternal grandfather and maternal grandmother; Cancer (age of onset: 58) in her mother; Depression in her sister; Diabetes in her father, paternal grandfather, and sister; Heart attack in her father; Heart disease in her father and paternal grandfather;  Hyperlipidemia in her father; Hypertension in her brother and father; Lymphoma in her mother; Meniere's disease in her brother; Pneumonia in her father; Schizophrenia in her daughter; Stroke in her father. There is no history of Colon cancer or Stomach cancer.   ROS:  Please see the history of present illness.  All other systems reviewed and negative.   PHYSICAL EXAM: VS:  BP 108/60 mmHg  Pulse 88  Wt 191 lb 12.8 oz (87 kg)  SpO2 91% Well nourished, well developed, in no acute distress HEENT: normal Neck: JVP 6 Cardiac:  normal S1, S2; RRR;  no murmur Lungs:  clear to auscultation bilaterally, no wheezing, rhonchi or rales Abd: soft, nontender, no hepatomegaly Ext: No edema.  Neuro:  CNs 2-12 intact, no focal abnormalities noted  EKG:  NSR, HR 76, leftward axis, nonspecific ST-T wave changes     ASSESSMENT AND PLAN:  1. Dyspnea 2. Chronic hypoxic respiratory failure on home O2 and CPAP.  3. Chronic diastolic heart failure   4. Restrictive lung physiology 5. Obesity  She has had a very thorough cardiopulmonary work up for her dyspnea and exertional hypoxia. I think her dyspnea is related to restrictive lung disease and severe diastolic dysfunction with exertional pulmonary HTN due to elevated left-side pressures with a normal PVR. Therapeutic options are limited (she is not candidate for selective pulmonary vasodilators with normal PVR).   Recommendations: 1) Manage volume closely. Weight is stable. Continue Lasix 40 mg bid. BMET/BNP today.  2) Wear O2 with all activity to keep sats >= 90% at all times 3) Continue pulmonary rehab.  4) Lose weight - she is starting [employer] low-carb diet through church program  RTC in 4 months for f/u  Alter Moss,MD 2:36 PM

## 2014-08-29 ENCOUNTER — Other Ambulatory Visit: Payer: Self-pay | Admitting: Internal Medicine

## 2014-08-30 ENCOUNTER — Ambulatory Visit (INDEPENDENT_AMBULATORY_CARE_PROVIDER_SITE_OTHER): Payer: Medicare Other | Admitting: Internal Medicine

## 2014-08-30 ENCOUNTER — Encounter: Payer: Self-pay | Admitting: Internal Medicine

## 2014-08-30 VITALS — BP 98/64 | HR 76 | Temp 97.9°F | Ht 64.5 in | Wt 187.0 lb

## 2014-08-30 DIAGNOSIS — Z780 Asymptomatic menopausal state: Secondary | ICD-10-CM | POA: Insufficient documentation

## 2014-08-30 DIAGNOSIS — R21 Rash and other nonspecific skin eruption: Secondary | ICD-10-CM

## 2014-08-30 DIAGNOSIS — I5032 Chronic diastolic (congestive) heart failure: Secondary | ICD-10-CM

## 2014-08-30 DIAGNOSIS — E119 Type 2 diabetes mellitus without complications: Secondary | ICD-10-CM

## 2014-08-30 LAB — CBC WITH DIFFERENTIAL/PLATELET
BASOS PCT: 0.4 % (ref 0.0–3.0)
Basophils Absolute: 0 10*3/uL (ref 0.0–0.1)
EOS PCT: 1.6 % (ref 0.0–5.0)
Eosinophils Absolute: 0.1 10*3/uL (ref 0.0–0.7)
HCT: 40.7 % (ref 36.0–46.0)
Hemoglobin: 13.6 g/dL (ref 12.0–15.0)
Lymphocytes Relative: 29.2 % (ref 12.0–46.0)
Lymphs Abs: 2.3 10*3/uL (ref 0.7–4.0)
MCHC: 33.4 g/dL (ref 30.0–36.0)
MCV: 96.5 fl (ref 78.0–100.0)
MONOS PCT: 10.3 % (ref 3.0–12.0)
Monocytes Absolute: 0.8 10*3/uL (ref 0.1–1.0)
NEUTROS PCT: 58.5 % (ref 43.0–77.0)
Neutro Abs: 4.5 10*3/uL (ref 1.4–7.7)
Platelets: 256 10*3/uL (ref 150.0–400.0)
RBC: 4.22 Mil/uL (ref 3.87–5.11)
RDW: 12.6 % (ref 11.5–15.5)
WBC: 7.7 10*3/uL (ref 4.0–10.5)

## 2014-08-30 LAB — COMPREHENSIVE METABOLIC PANEL
ALK PHOS: 72 U/L (ref 39–117)
ALT: 21 U/L (ref 0–35)
AST: 24 U/L (ref 0–37)
Albumin: 4.1 g/dL (ref 3.5–5.2)
BUN: 14 mg/dL (ref 6–23)
CO2: 31 mEq/L (ref 19–32)
CREATININE: 0.7 mg/dL (ref 0.4–1.2)
Calcium: 9.3 mg/dL (ref 8.4–10.5)
Chloride: 99 mEq/L (ref 96–112)
GFR: 87.12 mL/min (ref >60.00)
Glucose, Bld: 112 mg/dL — ABNORMAL HIGH (ref 70–99)
POTASSIUM: 3.4 meq/L — AB (ref 3.5–5.1)
Sodium: 141 mEq/L (ref 135–145)
Total Bilirubin: 0.6 mg/dL (ref 0.2–1.2)
Total Protein: 7.6 g/dL (ref 6.0–8.3)

## 2014-08-30 LAB — MICROALBUMIN / CREATININE URINE RATIO
Creatinine,U: 329.2 mg/dL
Microalb Creat Ratio: 0.4 mg/g (ref 0.0–30.0)
Microalb, Ur: 1.4 mg/dL (ref 0.0–1.9)

## 2014-08-30 LAB — LIPID PANEL
CHOLESTEROL: 146 mg/dL (ref 0–200)
HDL: 55.3 mg/dL (ref >39.00)
NONHDL: 90.7
TRIGLYCERIDES: 293 mg/dL — AB (ref 0.0–149.0)
Total CHOL/HDL Ratio: 3
VLDL: 58.6 mg/dL — ABNORMAL HIGH (ref 0.0–40.0)

## 2014-08-30 LAB — HEMOGLOBIN A1C: HEMOGLOBIN A1C: 6.9 % — AB (ref 4.6–6.5)

## 2014-08-30 LAB — LDL CHOLESTEROL, DIRECT: Direct LDL: 54.4 mg/dL

## 2014-08-30 NOTE — Assessment & Plan Note (Signed)
Will set up bone density testing. 

## 2014-08-30 NOTE — Assessment & Plan Note (Signed)
Appears euvolemic on exam today. Continue current medications. Renal function with labs today.

## 2014-08-30 NOTE — Patient Instructions (Signed)
Labs today.  Follow up in 3 months and sooner as needed. 

## 2014-08-30 NOTE — Assessment & Plan Note (Signed)
Will check A1c with labs today. Continue metformin.

## 2014-08-30 NOTE — Progress Notes (Signed)
Pre visit review using our clinic review tool, if applicable. No additional management support is needed unless otherwise documented below in the visit note. 

## 2014-08-30 NOTE — Progress Notes (Signed)
Subjective:    Patient ID: Betty [name], female    DOB: 1945/10/30, 69 y.o.   MRN: [id number]  HPI 69YO female presents for follow up.  Last seen here in clinic 11/23 with bilateral middle ear effusion and hearing loss. Referred to ENT for evaluation. Treated with prednisone with resolution.  Seen in CHF clinic on 12/10. No changes made to medications.  DM - Does not check BG. Compliant with medications.  Rash - Over lower legs. Present about 1 week. Red bumps, mostly over posterior lower legs. Not itchy or painful. Not changing. No new medications or supplements. No other symptoms. Feeling well. No fever, chills.  Past medical, surgical, family and social history per today's encounter.  Review of Systems  Constitutional: Negative for fever, chills, appetite change, fatigue and unexpected weight change.  Eyes: Negative for visual disturbance.  Respiratory: Negative for shortness of breath.   Cardiovascular: Negative for chest pain and leg swelling.  Gastrointestinal: Negative for nausea, vomiting, abdominal pain, diarrhea and constipation.  Musculoskeletal: Negative for myalgias and arthralgias.  Skin: Positive for color change and rash.  Neurological: Negative for headaches.  Hematological: Negative for adenopathy. Does not bruise/bleed easily.  Psychiatric/Behavioral: Negative for dysphoric mood. The patient is not nervous/anxious.        Objective:    BP 98/64 mmHg  Pulse 76  Temp(Src) 97.9 F (36.6 C) (Oral)  Ht 5' 4.5" (1.638 m)  Wt 187 lb (84.823 kg)  BMI 31.61 kg/m2  SpO2 91% Physical Exam  Constitutional: She is oriented to person, place, and time. She appears well-developed and well-nourished. No distress.  HENT:  Head: Normocephalic and atraumatic.  Right Ear: External ear normal.  Left Ear: External ear normal.  Nose: Nose normal.  Mouth/Throat: Oropharynx is clear and moist. No oropharyngeal exudate.  Eyes: Conjunctivae are normal. Pupils are equal,  round, and reactive to light. Right eye exhibits no discharge. Left eye exhibits no discharge. No scleral icterus.  Neck: Normal range of motion. Neck supple. No tracheal deviation present. No thyromegaly present.  Cardiovascular: Normal rate, regular rhythm, normal heart sounds and intact distal pulses.  Exam reveals no gallop and no friction rub.   No murmur heard. Pulmonary/Chest: Effort normal and breath sounds normal. No accessory muscle usage. No tachypnea. No respiratory distress. She has no decreased breath sounds. She has no wheezes. She has no rhonchi. She has no rales. She exhibits no tenderness.  Musculoskeletal: Normal range of motion. She exhibits no edema or tenderness.  Lymphadenopathy:    She has no cervical adenopathy.  Neurological: She is alert and oriented to person, place, and time. No cranial nerve deficit. She exhibits normal muscle tone. Coordination normal.  Skin: Skin is warm and dry. Rash noted. Rash is maculopapular. She is not diaphoretic. No erythema. No pallor.     Psychiatric: She has a normal mood and affect. Her behavior is normal. Judgment and thought content normal.          Assessment & Plan:   Problem List Items Addressed This Visit      Unprioritized   Chronic diastolic heart failure    Appears euvolemic on exam today. Continue current medications. Renal function with labs today.    Diabetes mellitus type 2, controlled - Primary    Will check A1c with labs today. Continue metformin.    Relevant Orders      Comprehensive metabolic panel      Hemoglobin A1c      Lipid panel  Microalbumin / creatinine urine ratio   Postmenopausal estrogen deficiency    Will set up bone density testing.    Relevant Orders      DG Bone Density   Rash and nonspecific skin eruption    Rash over posterior lower legs. Question dermatitis related to shaving, however not pruritic or painful. Will check CBC with labs today. Will set up dermatology evaluation.  IF rash persists, may need biopsy for definitive diagnosis.    Relevant Orders      CBC w/Diff      Ambulatory referral to Dermatology       Return in about 3 months (around 11/29/2014) for Recheck of Diabetes.

## 2014-08-30 NOTE — Assessment & Plan Note (Signed)
Rash over posterior lower legs. Question dermatitis related to shaving, however not pruritic or painful. Will check CBC with labs today. Will set up dermatology evaluation. IF rash persists, may need biopsy for definitive diagnosis.

## 2014-09-02 ENCOUNTER — Encounter: Payer: Self-pay | Admitting: *Deleted

## 2014-09-02 NOTE — Telephone Encounter (Signed)
From: Brendia Sacks Sent: 05/15/2014 8:11 PM EDT To: Aviva Kluver Subject: RE:Flu Shot Clinic Saturday May 22, 2014  I was given a flu shot the last time I was in your office.( 04-23-14) Please be sure my chart is updated.  Thanks. Kerby Nora  ----- Message ----- From: Genia Plants Sent: 05/15/2014 6:37 PM EDT To: Brendia Sacks Subject: Flu Shot Clinic Saturday May 22, 2014  [facility] will hold a [place] [facility] on Saturday, September 26 from 9 am to noon. In order to plan appropriately, we ask you to please call the office to schedule an appointment time during the clinic. Our schedulers are ready to assist you, Monday through Friday, 8 am to 5 pm at [phone]. Thank you for choosing [facility] for your healthcare needs.

## 2014-09-28 ENCOUNTER — Ambulatory Visit: Payer: Self-pay | Admitting: Internal Medicine

## 2014-09-28 ENCOUNTER — Telehealth: Payer: Self-pay | Admitting: Internal Medicine

## 2014-09-28 NOTE — Telephone Encounter (Signed)
Bone Density Testing was normal.

## 2014-09-29 ENCOUNTER — Other Ambulatory Visit: Payer: Self-pay | Admitting: Internal Medicine

## 2014-10-06 ENCOUNTER — Encounter: Payer: Self-pay | Admitting: Internal Medicine

## 2014-10-13 ENCOUNTER — Telehealth: Payer: Self-pay | Admitting: *Deleted

## 2014-10-13 ENCOUNTER — Other Ambulatory Visit: Payer: Self-pay | Admitting: *Deleted

## 2014-10-13 ENCOUNTER — Encounter: Payer: Self-pay | Admitting: Internal Medicine

## 2014-10-13 DIAGNOSIS — Z86010 Personal history of colonic polyps: Secondary | ICD-10-CM

## 2014-10-13 NOTE — Telephone Encounter (Signed)
Patient is due for recall colonoscopy for suvellience of colon polyps in 2013. Patient has been scheduled at [facility] on 12/20/14 @ 10:30 am. She is also scheduled for a previsit on 12/01/14 @ 11:00 am. Patient verbalizes understanding of this.

## 2014-10-28 ENCOUNTER — Other Ambulatory Visit: Payer: Self-pay | Admitting: Internal Medicine

## 2014-11-04 ENCOUNTER — Ambulatory Visit (INDEPENDENT_AMBULATORY_CARE_PROVIDER_SITE_OTHER): Payer: Medicare Other

## 2014-11-04 ENCOUNTER — Ambulatory Visit (INDEPENDENT_AMBULATORY_CARE_PROVIDER_SITE_OTHER): Payer: Medicare Other | Admitting: Podiatry

## 2014-11-04 VITALS — BP 101/60 | HR 82 | Resp 16 | Ht 65.0 in | Wt 187.0 lb

## 2014-11-04 DIAGNOSIS — M779 Enthesopathy, unspecified: Secondary | ICD-10-CM

## 2014-11-04 DIAGNOSIS — M2140 Flat foot [pes planus] (acquired), unspecified foot: Secondary | ICD-10-CM

## 2014-11-04 DIAGNOSIS — M2010 Hallux valgus (acquired), unspecified foot: Secondary | ICD-10-CM | POA: Diagnosis not present

## 2014-11-04 DIAGNOSIS — E1140 Type 2 diabetes mellitus with diabetic neuropathy, unspecified: Secondary | ICD-10-CM | POA: Diagnosis not present

## 2014-11-04 DIAGNOSIS — E1149 Type 2 diabetes mellitus with other diabetic neurological complication: Secondary | ICD-10-CM

## 2014-11-04 NOTE — Patient Instructions (Signed)
Diabetes and Foot Care Diabetes may cause you to have problems because of poor blood supply (circulation) to your feet and legs. This may cause the skin on your feet to become thinner, break easier, and heal more slowly. Your skin may become dry, and the skin may peel and crack. You may also have nerve damage in your legs and feet causing decreased feeling in them. You may not notice minor injuries to your feet that could lead to infections or more serious problems. Taking care of your feet is one of the most important things you can do for yourself.  HOME CARE INSTRUCTIONS  Wear shoes at all times, even in the house. Do not go barefoot. Bare feet are easily injured.  Check your feet daily for blisters, cuts, and redness. If you cannot see the bottom of your feet, use a mirror or ask someone for help.  Wash your feet with warm water (do not use hot water) and mild soap. Then pat your feet and the areas between your toes until they are completely dry. Do not soak your feet as this can dry your skin.  Apply a moisturizing lotion or petroleum jelly (that does not contain alcohol and is unscented) to the skin on your feet and to dry, brittle toenails. Do not apply lotion between your toes.  Trim your toenails straight across. Do not dig under them or around the cuticle. File the edges of your nails with an emery board or nail file.  Do not cut corns or calluses or try to remove them with medicine.  Wear clean socks or stockings every day. Make sure they are not too tight. Do not wear knee-high stockings since they may decrease blood flow to your legs.  Wear shoes that fit properly and have enough cushioning. To break in new shoes, wear them for just a few hours a day. This prevents you from injuring your feet. Always look in your shoes before you put them on to be sure there are no objects inside.  Do not cross your legs. This may decrease the blood flow to your feet.  If you find a minor scrape,  cut, or break in the skin on your feet, keep it and the skin around it clean and dry. These areas may be cleansed with mild soap and water. Do not cleanse the area with peroxide, alcohol, or iodine.  When you remove an adhesive bandage, be sure not to damage the skin around it.  If you have a wound, look at it several times a day to make sure it is healing.  Do not use heating pads or hot water bottles. They may burn your skin. If you have lost feeling in your feet or legs, you may not know it is happening until it is too late.  Make sure your health care provider performs a complete foot exam at least annually or more often if you have foot problems. Report any cuts, sores, or bruises to your health care provider immediately. SEEK MEDICAL CARE IF:   You have an injury that is not healing.  You have cuts or breaks in the skin.  You have an ingrown nail.  You notice redness on your legs or feet.  You feel burning or tingling in your legs or feet.  You have pain or cramps in your legs and feet.  Your legs or feet are numb.  Your feet always feel cold. SEEK IMMEDIATE MEDICAL CARE IF:   There is increasing redness,   swelling, or pain in or around a wound.  There is a red line that goes up your leg.  Pus is coming from a wound.  You develop a fever or as directed by your health care provider.  You notice a bad smell coming from an ulcer or wound. Document Released: 08/10/2000 Document Revised: 04/15/2013 Document Reviewed: 01/20/2013 ExitCare Patient Information 2015 [employer]. This information is not intended to replace advice given to you by your health care provider. Make sure you discuss any questions you have with your health care provider.  

## 2014-11-04 NOTE — Progress Notes (Signed)
Patient ID: Betty [name], female   DOB: 1946/08/16, 69 y.o.   MRN: [id number]  Subjective: 69 year old female presents the office today with complaints of left foot arch pain, big toe joint pain and tendinitis to her Achilles tendon. She states that she has pain in the arch of her foot or in the big toe joint after prolonged activity. She does that she has congestive heart failure she is going to therapy and after walking on the treadmill she had pain in the arch and big toe. She states that she was wearing an EZ spirit shoe when walking on the treadmill and doing the exercises. She states that due to the bone spur the back of her left heel she is unable to wear closed toed shoes without difficulty. She states that she has noticed some redness overlying the side of her big toe joint on the left foot which is present after wearing shoes. She denies any open lesions. She does have a history of gout however she states that this pain is different than her prior gout attacks.   Objective: AAO 3, NAD DP/PT pulses palpable, CRT less than 3 seconds Decreased protective sensation with Sims once the monofilament, vibratory sensation intact, Achilles tendon reflex intact. There is moderate structural HAV deformity present with medial deviation of the first metatarsal head and lateral deviation of the hallux. There is slight erythema overlying the medial aspect of the first metatarsal head likely from pressure. There is no pain with first MTPJ range of motion there is no crepitation. There is a decrease in medial arch height upon weightbearing with equinus present. There is a prominent retrocalcaneal exostosis palpable on the posterior aspect of the calcaneus. Currently, there is no tenderness palpation overlying this area. There is no pain on the course of/insertion of the Achilles tendon. There is no pain along the medial band of the plantar fascia or on other musculature to the plantar aspect of the foot. There  is no area of pinpoint bony tenderness or pain with vibratory sensation to bilateral lower extremities. There is no overlying edema, erythema (except for otherwise stated), or increase in warmth to bilateral lower extremities.  MMT 5/5, ROM WNL No open lesions or pre-ulcerative lesions are identified bilaterally. No pain with calf compression, swelling, warmth, erythema.  Assessment: 69 year old female with left foot pain likely due to flatfoot, bunion, prominent retrocalcaneal exostosis  Plan : -X-rays were obtained and reviewed with the patient.  -Treatment options were discussed including alternatives, risks, complications.  -Given her foot type I believe that she would benefit from diabetic shoes and inserts. We can try to get her into his shoes to have some elasticity to it to help take pressure off of the bony prominences. This would also help support her arch. Paperwork was completed for precertification.  -Also discussed other shoe gear modifications.  -Follow-up after diabetic shoes approval or sooner if any problems are to arise. In the meantime encouraged to call the office with any questions, concerns, change in symptoms.

## 2014-11-23 ENCOUNTER — Telehealth: Payer: Self-pay | Admitting: Internal Medicine

## 2014-11-23 NOTE — Telephone Encounter (Signed)
[facility] called looking to get the information for Betty [name] (DOB:[birth year]) fax to them for her diabetes foot wear.

## 2014-11-24 NOTE — Telephone Encounter (Signed)
Information was faxed yesterday

## 2014-11-25 ENCOUNTER — Telehealth: Payer: Self-pay | Admitting: Podiatry

## 2014-11-25 NOTE — Telephone Encounter (Signed)
CALLED PATIENT TO SET UP APPT WITH DR.WAGONER

## 2014-12-01 ENCOUNTER — Ambulatory Visit (AMBULATORY_SURGERY_CENTER): Payer: Self-pay

## 2014-12-01 VITALS — Ht 65.0 in | Wt 188.6 lb

## 2014-12-01 DIAGNOSIS — Z860100 Personal history of colon polyps, unspecified: Secondary | ICD-10-CM

## 2014-12-01 MED ORDER — MOVIPREP 100 G PO SOLR: 1.0000 | Freq: Once | ORAL | Status: DC

## 2014-12-01 NOTE — Progress Notes (Signed)
PT IS ON OXYGEN!!! No diet/weight loss meds No past problems with anesthesia (except giggling with cardiac cath) No allergies to eggs or soy

## 2014-12-03 ENCOUNTER — Encounter (HOSPITAL_COMMUNITY): Payer: Self-pay | Admitting: *Deleted

## 2014-12-09 ENCOUNTER — Ambulatory Visit (INDEPENDENT_AMBULATORY_CARE_PROVIDER_SITE_OTHER): Payer: Medicare Other | Admitting: Podiatry

## 2014-12-09 DIAGNOSIS — E1149 Type 2 diabetes mellitus with other diabetic neurological complication: Secondary | ICD-10-CM

## 2014-12-09 DIAGNOSIS — E1140 Type 2 diabetes mellitus with diabetic neuropathy, unspecified: Secondary | ICD-10-CM

## 2014-12-09 NOTE — Progress Notes (Signed)
Patient was scanned for diabetic inserts  No new complaints at this time.

## 2014-12-20 ENCOUNTER — Ambulatory Visit (HOSPITAL_COMMUNITY): Payer: Medicare Other | Admitting: Anesthesiology

## 2014-12-20 ENCOUNTER — Encounter (HOSPITAL_COMMUNITY): Payer: Self-pay | Admitting: Internal Medicine

## 2014-12-20 ENCOUNTER — Ambulatory Visit (HOSPITAL_COMMUNITY)
Admission: RE | Admit: 2014-12-20 | Discharge: 2014-12-20 | Disposition: A | Discharge status: Home / Self Care | Payer: Medicare Other | Source: Ambulatory Visit | Attending: Internal Medicine | Admitting: Internal Medicine

## 2014-12-20 ENCOUNTER — Encounter (HOSPITAL_COMMUNITY)
Admission: RE | Disposition: A | Discharge status: Home / Self Care | Payer: Self-pay | Source: Ambulatory Visit | Attending: Internal Medicine

## 2014-12-20 DIAGNOSIS — Z86010 Personal history of colonic polyps: Secondary | ICD-10-CM | POA: Diagnosis not present

## 2014-12-20 DIAGNOSIS — Z1211 Encounter for screening for malignant neoplasm of colon: Secondary | ICD-10-CM | POA: Insufficient documentation

## 2014-12-20 DIAGNOSIS — E119 Type 2 diabetes mellitus without complications: Secondary | ICD-10-CM | POA: Diagnosis not present

## 2014-12-20 DIAGNOSIS — I5032 Chronic diastolic (congestive) heart failure: Secondary | ICD-10-CM | POA: Insufficient documentation

## 2014-12-20 DIAGNOSIS — G709 Myoneural disorder, unspecified: Secondary | ICD-10-CM | POA: Insufficient documentation

## 2014-12-20 DIAGNOSIS — G4730 Sleep apnea, unspecified: Secondary | ICD-10-CM | POA: Insufficient documentation

## 2014-12-20 DIAGNOSIS — Z79899 Other long term (current) drug therapy: Secondary | ICD-10-CM | POA: Insufficient documentation

## 2014-12-20 DIAGNOSIS — I10 Essential (primary) hypertension: Secondary | ICD-10-CM | POA: Insufficient documentation

## 2014-12-20 DIAGNOSIS — I428 Other cardiomyopathies: Secondary | ICD-10-CM | POA: Insufficient documentation

## 2014-12-20 DIAGNOSIS — Z9981 Dependence on supplemental oxygen: Secondary | ICD-10-CM | POA: Diagnosis not present

## 2014-12-20 DIAGNOSIS — M1990 Unspecified osteoarthritis, unspecified site: Secondary | ICD-10-CM | POA: Insufficient documentation

## 2014-12-20 DIAGNOSIS — D122 Benign neoplasm of ascending colon: Secondary | ICD-10-CM

## 2014-12-20 DIAGNOSIS — K5790 Diverticulosis of intestine, part unspecified, without perforation or abscess without bleeding: Secondary | ICD-10-CM | POA: Diagnosis not present

## 2014-12-20 DIAGNOSIS — D123 Benign neoplasm of transverse colon: Secondary | ICD-10-CM | POA: Diagnosis not present

## 2014-12-20 DIAGNOSIS — Z6831 Body mass index (BMI) 31.0-31.9, adult: Secondary | ICD-10-CM | POA: Insufficient documentation

## 2014-12-20 DIAGNOSIS — E785 Hyperlipidemia, unspecified: Secondary | ICD-10-CM | POA: Insufficient documentation

## 2014-12-20 DIAGNOSIS — I252 Old myocardial infarction: Secondary | ICD-10-CM | POA: Diagnosis not present

## 2014-12-20 HISTORY — DX: Unspecified osteoarthritis, unspecified site: M19.90

## 2014-12-20 HISTORY — DX: Unspecified cataract: H26.9

## 2014-12-20 HISTORY — DX: Essential (primary) hypertension: I10

## 2014-12-20 HISTORY — DX: Other specified postprocedural states: Z98.890

## 2014-12-20 HISTORY — DX: Diverticulosis of intestine, part unspecified, without perforation or abscess without bleeding: K57.90

## 2014-12-20 HISTORY — DX: Type 2 diabetes mellitus without complications: E11.9

## 2014-12-20 HISTORY — PX: COLONOSCOPY WITH PROPOFOL: SHX5780

## 2014-12-20 HISTORY — DX: Sleep apnea, unspecified: G47.30

## 2014-12-20 HISTORY — DX: Nausea with vomiting, unspecified: R11.2

## 2014-12-20 LAB — HM COLONOSCOPY

## 2014-12-20 SURGERY — COLONOSCOPY WITH PROPOFOL
Anesthesia: Monitor Anesthesia Care

## 2014-12-20 MED ORDER — PROPOFOL 10 MG/ML IV BOLUS
INTRAVENOUS | Status: AC
  Filled 2014-12-20: qty 20

## 2014-12-20 MED ORDER — LIDOCAINE HCL (PF) 2 % IJ SOLN
INTRAMUSCULAR | Status: DC | PRN
  Administered 2014-12-20: 20 mg via INTRADERMAL

## 2014-12-20 MED ORDER — SODIUM CHLORIDE 0.9 % IV SOLN: INTRAVENOUS | Status: DC

## 2014-12-20 MED ORDER — LACTATED RINGERS IV SOLN
INTRAVENOUS | Status: DC
  Administered 2014-12-20: 1000 mL via INTRAVENOUS

## 2014-12-20 MED ORDER — PROPOFOL 10 MG/ML IV BOLUS
INTRAVENOUS | Status: DC | PRN
  Administered 2014-12-20: 20 mg via INTRAVENOUS
  Administered 2014-12-20: 50 mg via INTRAVENOUS
  Administered 2014-12-20: 20 mg via INTRAVENOUS
  Administered 2014-12-20: 100 mg via INTRAVENOUS
  Administered 2014-12-20: 20 mg via INTRAVENOUS
  Administered 2014-12-20: 50 mg via INTRAVENOUS

## 2014-12-20 SURGICAL SUPPLY — 22 items

## 2014-12-20 NOTE — Anesthesia Postprocedure Evaluation (Signed)
  Anesthesia Post-op Note  Patient: Betty [name]  Procedure(s) Performed: Procedure(s): COLONOSCOPY WITH PROPOFOL (N/A)  Patient Location: PACU  Anesthesia Type:MAC  Level of Consciousness: awake  Airway and Oxygen Therapy: Patient Spontanous Breathing  Post-op Pain: none  Post-op Assessment: Post-op Vital signs reviewed, Patient's Cardiovascular Status Stable, Respiratory Function Stable, Patent Airway, No signs of Nausea or vomiting and Pain level controlled  Post-op Vital Signs: Reviewed and stable  Last Vitals:  Filed Vitals:   12/20/14 1119  BP: 96/43  Pulse: 83  Temp: 36.4 C  Resp: 20    Complications: No apparent anesthesia complications

## 2014-12-20 NOTE — Transfer of Care (Signed)
Immediate Anesthesia Transfer of Care Note  Patient: Betty [name]  Procedure(s) Performed: Procedure(s): COLONOSCOPY WITH PROPOFOL (N/A)  Patient Location: PACU  Anesthesia Type:MAC  Level of Consciousness:  sedated, patient cooperative and responds to stimulation  Airway & Oxygen Therapy:Patient Spontanous Breathing and Patient connected to face mask oxgen  Post-op Assessment:  Report given to PACU RN and Post -op Vital signs reviewed and stable  Post vital signs:  Reviewed and stable  Last Vitals:  Filed Vitals:   12/20/14 1003  BP: 131/66  Pulse: 76  Temp: 36.6 C  Resp: 20    Complications: No apparent anesthesia complications

## 2014-12-20 NOTE — Anesthesia Preprocedure Evaluation (Addendum)
Anesthesia Evaluation  Patient identified by MRN, date of birth, ID band Patient awake    Reviewed: Allergy & Precautions, NPO status , Patient's Chart, lab work & pertinent test results  History of Anesthesia Complications Negative for: history of anesthetic complications  Airway Mallampati: IV  TM Distance: <3 FB Neck ROM: Full    Dental  (+) Teeth Intact   Pulmonary shortness of breath and with exertion, sleep apnea and Oxygen sleep apnea ,  breath sounds clear to auscultation        Cardiovascular hypertension, Pt. on medications - angina+CHF - Past MI Rhythm:Regular     Neuro/Psych  Neuromuscular disease negative psych ROS   GI/Hepatic negative GI ROS, Neg liver ROS,   Endo/Other  diabetes, Type 2Morbid obesity  Renal/GU negative Renal ROS     Musculoskeletal   Abdominal   Peds  Hematology negative hematology ROS (+)   Anesthesia Other Findings   Reproductive/Obstetrics                            Anesthesia Physical Anesthesia Plan  ASA: III  Anesthesia Plan: MAC   Post-op Pain Management:    Induction:   Airway Management Planned: Natural Airway  Additional Equipment: None  Intra-op Plan:   Post-operative Plan:   Informed Consent: I have reviewed the patients History and Physical, chart, labs and discussed the procedure including the risks, benefits and alternatives for the proposed anesthesia with the patient or authorized representative who has indicated his/her understanding and acceptance.   Dental advisory given  Plan Discussed with: Surgeon and CRNA  Anesthesia Plan Comments:         Anesthesia Quick Evaluation

## 2014-12-20 NOTE — Discharge Instructions (Signed)

## 2014-12-20 NOTE — H&P (Signed)
HPI: Betty [name] is a 69 year old female with a past medical history of adenomatous colon polyps, nonischemic cardiomyopathy, sleep apnea on CPAP, nocturnal oxygen use, hypertension, diabetes who presents for outpatient surveillance colonoscopy. This is being performed in the hospital setting due to nocturnal use of oxygen. She reports she is feeling well. Tolerated the prep. No abdominal complaints including diarrhea, constipation, rectal bleeding or melena. Last colonoscopy was March 2013, 4 polyps removed all tubular adenomas without high-grade dysplasia.  Past Medical History  Diagnosis Date  . Hyperlipidemia   . Gout   . NICM (nonischemic cardiomyopathy) 2002    EF 25%; improved to normal - echo 4/08: EF 50-55%, mild MR, mild LAE, mild TR;    cath 3/03: normal cors, EF 40%  . Glucose intolerance (impaired glucose tolerance)   . History of chicken pox   . Allergic rhinitis     never tested, fall and spring  . Sleep apnea     cpap settings 4  . Hypertension   . Shortness of breath dyspnea   . CHF (congestive heart failure)     Diastolic heart failure  . Diabetes mellitus without complication   . Diverticulosis     problems with frequent gas, burping  . Arthritis     hands,knees, feet  . Cancer     skin cancers only  . Cataracts, bilateral     not surgical yet    Past Surgical History  Procedure Laterality Date  . Nsvd      x 2  . Cystectomy  1996    l breast  . Choleystectomy  02/2002  . Vaginal delivery      x2  . Tubal ligation  1975  . Dilation and curettage of uterus  2011    Dr. Hulan Fray at [facility]  . Induced abortion    . Breast biopsy  80's    Cyst removed  . Cardiac catheterization    . Bilateral vats ablation Right     biopsy done 2015- [facility]     (Not in an outpatient encounter)  Allergies  Allergen Reactions  . Amoxicillin     REACTION: RASH  . Etodolac     Bloody stool  . Hctz [Hydrochlorothiazide]     Increases gout flare  .  Meloxicam     constipation  . Sulfonamide Derivatives     REACTION: RASH    Family History  Problem Relation Age of Onset  . Lymphoma Mother   . Cancer Mother 44    Lymphoma  . COPD Father     was a smoker  . Stroke Father   . Pneumonia Father   . Diabetes Father   . Hyperlipidemia Father   . Heart disease Father   . Diabetes Sister   . Depression Sister   . Meniere's disease Brother   . Diabetes Paternal Grandfather   . Heart disease Paternal Grandfather   . Schizophrenia Daughter   . Cancer Maternal Grandmother     Bladder  . Cancer Maternal Grandfather     leukemia  . Colon cancer Neg Hx   . Stomach cancer Neg Hx   . Heart attack Father   . Hypertension Brother   . Hypertension Father     History  Substance Use Topics  . Smoking status: Never Smoker   . Smokeless tobacco: Never Used  . Alcohol Use: No    ROS: As per history of present illness, otherwise negative  BP 131/66 mmHg  Pulse 76  Temp(Src) 97.8 F (36.6 C) (Oral)  Resp 20  Ht 5\' 5"  (1.651 m)  Wt 188 lb (85.276 kg)  BMI 31.28 kg/m2  SpO2 94%  Constitutional: Well-developed and well-nourished. No distress. HEENT: Normocephalic and atraumatic. Oropharynx is clear and moist. No oropharyngeal exudate. Conjunctivae are normal.  No scleral icterus. Neck: Neck supple. Trachea midline. Cardiovascular: Normal rate, regular rhythm and intact distal pulses. No M/R/G Pulmonary/chest: Effort normal and breath sounds normal. No wheezing, rales or rhonchi. Abdominal: Soft, nontender, nondistended. Bowel sounds active throughout. There are no masses palpable. No hepatosplenomegaly. Extremities: no clubbing, cyanosis, or edema Neurological: Alert and oriented to person place and time. Skin: Skin is warm and dry. No rashes noted. Psychiatric: Normal mood and affect. Behavior is normal.  RELEVANT LABS AND IMAGING: CBC    Component Value Date/Time   WBC 7.7 08/30/2014 0819   RBC 4.22 08/30/2014 0819    HGB 13.6 08/30/2014 0819   HCT 40.7 08/30/2014 0819   PLT 256.0 08/30/2014 0819   MCV 96.5 08/30/2014 0819   MCH 33.7 05/03/2010 1055   MCHC 33.4 08/30/2014 0819   RDW 12.6 08/30/2014 0819   LYMPHSABS 2.3 08/30/2014 0819   MONOABS 0.8 08/30/2014 0819   EOSABS 0.1 08/30/2014 0819   BASOSABS 0.0 08/30/2014 0819    CMP     Component Value Date/Time   NA 141 08/30/2014 0819   K 3.4* 08/30/2014 0819   CL 99 08/30/2014 0819   CO2 31 08/30/2014 0819   GLUCOSE 112* 08/30/2014 0819   BUN 14 08/30/2014 0819   CREATININE 0.7 08/30/2014 0819   CREATININE 0.64 01/08/2014 1657   CALCIUM 9.3 08/30/2014 0819   PROT 7.6 08/30/2014 0819   ALBUMIN 4.1 08/30/2014 0819   AST 24 08/30/2014 0819   ALT 21 08/30/2014 0819   ALKPHOS 72 08/30/2014 0819   BILITOT 0.6 08/30/2014 0819   GFRNONAA >90 08/05/2014 1153   GFRNONAA >89 01/08/2014 1657   GFRAA >90 08/05/2014 1153   GFRAA >89 01/08/2014 1657    ASSESSMENT/PLAN: 69 year old female with a past medical history of adenomatous colon polyps, nonischemic cardiomyopathy, sleep apnea on CPAP, nocturnal oxygen use, hypertension, diabetes who presents for outpatient surveillance colonoscopy.  1. History of colon polyps -- surveillance colonoscopy due at this time. The nature of the procedure, as well as the risks, benefits, and alternatives were carefully and thoroughly reviewed with the patient. Ample time for discussion and questions allowed. The patient understood, was satisfied, and agreed to proceed.

## 2014-12-20 NOTE — Op Note (Signed)
[facility] [place], [address]   COLONOSCOPY PROCEDURE REPORT  PATIENT: Betty [name], Betty [name]  MR#: [id number] BIRTHDATE: November 12, 1945 , 15  yrs. old GENDER: female ENDOSCOPIST: [name], MD PROCEDURE DATE:  12/20/2014 PROCEDURE:   Colonoscopy, surveillance and Colonoscopy with cold biopsy polypectomy First Screening Colonoscopy - Avg.  risk and is 50 yrs.  old or older - No.  Prior Negative Screening - Now for repeat screening. N/A  History of Adenoma - Now for follow-up colonoscopy & has been > or = to 3 yrs.  Yes hx of adenoma.  Has been 3 or more years since last colonoscopy. ASA CLASS:   Class III INDICATIONS:high risk patient with personal history of colonic polyps (4 adenomas removed March 2013). MEDICATIONS: Monitored anesthesia care and Per Anesthesia  DESCRIPTION OF PROCEDURE:   After the risks benefits and alternatives of the procedure were thoroughly explained, informed consent was obtained.  The digital rectal exam revealed no abnormalities of the rectum.   The Pentax Ped Colon [id number] endoscope was introduced through the anus and advanced to the cecum, which was identified by both the appendix and ileocecal valve. No adverse events experienced.   The quality of the prep was good, using MoviPrep  The instrument was then slowly withdrawn as the colon was fully examined.  COLON FINDINGS: Two sessile polyps ranging between 3-93mm in size were found in the ascending colon and transverse colon. Polypectomies were performed with cold forceps.  The resection was complete, the polyp tissue was completely retrieved and sent to histology.   There was moderate diverticulosis noted in the sigmoid colon.   The examination was otherwise normal.  Retroflexed views revealed no abnormalities. The time to cecum = 5.2 Withdrawal time = 15.9   The scope was withdrawn and the procedure completed. COMPLICATIONS: There were no complications.  ENDOSCOPIC  IMPRESSION: 1.   Two sessile polyps ranging between 3-83mm in size were found in the ascending colon and transverse colon; polypectomies were performed with cold forceps 2.   Moderate diverticulosis was noted in the sigmoid colon 3.   The examination was otherwise normal  RECOMMENDATIONS: 1.  Await pathology results 2.  High fiber diet 3.  Repeat Colonoscopy in 5 years. 4.  You will receive a letter within 1-2 weeks with the results of your biopsy as well as final recommendations.  Please call my office if you have not received a letter after 3 weeks.  eSigned:  [name], MD 12/20/2014 11:19 AM   cc: Ronette Deter MD and The Patient

## 2014-12-21 ENCOUNTER — Encounter (HOSPITAL_COMMUNITY): Payer: Self-pay | Admitting: Internal Medicine

## 2014-12-21 ENCOUNTER — Encounter: Payer: Self-pay | Admitting: Internal Medicine

## 2014-12-28 ENCOUNTER — Encounter (HOSPITAL_COMMUNITY): Payer: Self-pay | Admitting: Vascular Surgery

## 2014-12-30 LAB — HM DIABETES EYE EXAM

## 2015-01-17 ENCOUNTER — Encounter (HOSPITAL_COMMUNITY): Payer: Self-pay

## 2015-01-17 ENCOUNTER — Ambulatory Visit (HOSPITAL_COMMUNITY)
Admission: RE | Admit: 2015-01-17 | Discharge: 2015-01-17 | Disposition: A | Discharge status: Home / Self Care | Payer: Medicare Other | Source: Ambulatory Visit | Attending: Internal Medicine | Admitting: Internal Medicine

## 2015-01-17 VITALS — BP 106/59 | HR 86 | Resp 18 | Wt 188.0 lb

## 2015-01-17 DIAGNOSIS — M109 Gout, unspecified: Secondary | ICD-10-CM | POA: Diagnosis not present

## 2015-01-17 DIAGNOSIS — E119 Type 2 diabetes mellitus without complications: Secondary | ICD-10-CM | POA: Diagnosis not present

## 2015-01-17 DIAGNOSIS — Z7982 Long term (current) use of aspirin: Secondary | ICD-10-CM | POA: Diagnosis not present

## 2015-01-17 DIAGNOSIS — R0600 Dyspnea, unspecified: Secondary | ICD-10-CM | POA: Diagnosis present

## 2015-01-17 DIAGNOSIS — I5032 Chronic diastolic (congestive) heart failure: Secondary | ICD-10-CM | POA: Diagnosis not present

## 2015-01-17 DIAGNOSIS — Z9981 Dependence on supplemental oxygen: Secondary | ICD-10-CM | POA: Insufficient documentation

## 2015-01-17 DIAGNOSIS — E785 Hyperlipidemia, unspecified: Secondary | ICD-10-CM | POA: Insufficient documentation

## 2015-01-17 DIAGNOSIS — E669 Obesity, unspecified: Secondary | ICD-10-CM | POA: Diagnosis not present

## 2015-01-17 DIAGNOSIS — G4730 Sleep apnea, unspecified: Secondary | ICD-10-CM | POA: Diagnosis not present

## 2015-01-17 DIAGNOSIS — J9611 Chronic respiratory failure with hypoxia: Secondary | ICD-10-CM | POA: Insufficient documentation

## 2015-01-17 DIAGNOSIS — I10 Essential (primary) hypertension: Secondary | ICD-10-CM | POA: Diagnosis not present

## 2015-01-17 DIAGNOSIS — Z79899 Other long term (current) drug therapy: Secondary | ICD-10-CM | POA: Insufficient documentation

## 2015-01-17 LAB — BASIC METABOLIC PANEL
Anion gap: 10 (ref 5–15)
BUN: 14 mg/dL (ref 6–20)
CO2: 32 mmol/L (ref 22–32)
Calcium: 10 mg/dL (ref 8.9–10.3)
Chloride: 98 mmol/L — ABNORMAL LOW (ref 101–111)
Creatinine, Ser: 0.59 mg/dL (ref 0.44–1.00)
GFR calc Af Amer: >60 mL/min (ref >60)
GFR calc non Af Amer: >60 mL/min (ref >60)
GLUCOSE: 100 mg/dL — AB (ref 65–99)
POTASSIUM: 4.5 mmol/L (ref 3.5–5.1)
SODIUM: 140 mmol/L (ref 135–145)

## 2015-01-17 LAB — BRAIN NATRIURETIC PEPTIDE: B Natriuretic Peptide: 11.6 pg/mL (ref 0.0–100.0)

## 2015-01-17 NOTE — Progress Notes (Signed)
Patient ID: Betty [name], female   DOB: 1946-03-14, 69 y.o.   MRN: [id number]   Cardiology Office Note  Date:  01/17/2015   ID:  Betty, [name] August 31, 1945, MRN [id number]  PCP:  Rica Mast, MD  Cardiologist:  Dr. Dorris Carnes      History of Present Illness: [name] is a 69 y.o. female with a hx of NICM with previous EF 25% (improved to normal), HL, gout, glucose intol, normal cors by [facility] in 2003, OSA. Referred by Dr. Harrington Challenger for further evaluation of exertional dyspnea.   She developed increasing shortness of breath over the course of 2 years about 2011 to 2013. She had an extensive Pulmonary work-up at [facility] and has been seen by Dr. Lake Bells. Pulmonary function testing showed restrictive lung disease with a markedly depressed DLCO at 35% predicted. CT scanning of her chest showed upper lobe groundglass abnormalities versus air trapping. A home polysomnogram showed an AHI of 12 and multiple desaturation events to 80% independent of apneas. An abg showed resting hypercarbia (pCO2 52), and a MIP/MEP was 36%/28% predicted. A follow up diaphragm study was normal. A repeat CT scan was performed, no pulmonary embolism was seen and radiology commented on atelectasis but no clear intersitial lung disease. Blood work including an HP panel and serologies for connective tissue diseases has been essentially negative (two partial aspergillus bands on HP panel). Two echocardiograms have not shown evidence of pulmonary hypertension. After starting on CPAP with nasal pillows and oxygen with exertion and sleep she started feeling better.  She had an open lung biopsy at [facility] in November of 2014 which showed small amounts of fibrosis around her pulmonary arteries as well as findings consistent with very mild bronchiolitis.  Resting RHC was normal.  Referred to [facility] (Dr. Gilles Chiquito).  RHC with exercise in 3/15 showed marked increase in pulmonary pressures and PCWP with  exercise with no change in PVR. O2 sats down to 77% with exercise. Corrected with O2. PA rest 36/20 (26)  PCWP 15 with exercise PA 78/40 (56) PCWP 28  This was felt to reflect diastolic dysfunction and intrinsic lung disease.  Lung bx was not suggestive of clear lung pathology.  CT was neg for PE.  Continued management of diastolic CHF has been recommended.     Currently stable.  She is using CPAP with oxygen.  She is not using oxygen during the day. No dyspnea walking on flat ground.  Able to walk around in stores without problems.  Dyspnea with heavier housework like putting sheets on the beds.  No chest pain.  No orthopnea/PND.  No syncope/lightheadedness.  She has been taking Lasix 40 mg daily with occasionally an extra dose in the afternoon.  Weight is down 3 lbs.    I walked her in the office today.  Oxygen saturation 91% at rest, 89% with ambulation.     Studies:  - RHC (5/14):  RA 9, RV 32/8, PA 30/16/23, PCWP 12, 2.1 WU, CO 5.3, CI 2.7  - Echo (3/14):  EF 55-60%, no RWMA, Gr 1 DD, mild MR  - Nuclear (11/13):  Breast atten, no ischemia, EF 55%; Low Risk   - PFTs. [place] 2014: FVC 1.46 L (48% pred), TLC 2.77 L (55% pred) ERV 0.03 (3% pred), DLCO 8.4 (35% pred)  Recent Labs/Images: 01/21/2014: LDL (calc) 50 08/05/2014: Pro B Natriuretic peptide (BNP) 36.4 08/30/2014: ALT 21; Direct LDL 54.4; HDL-C 55.30; Hemoglobin 13.6 01/17/2015: B Natriuretic Peptide 11.6;  Creatinine 0.59; Potassium 4.5   Wt Readings from Last 3 Encounters:  12/20/14 188 lb (85.276 kg)  12/01/14 188 lb 9.6 oz (85.548 kg)  11/04/14 187 lb (84.823 kg)     Past Medical History  Diagnosis Date  . Hyperlipidemia   . Gout   . NICM (nonischemic cardiomyopathy) 2002    EF 25%; improved to normal - echo 4/08: EF 50-55%, mild MR, mild LAE, mild TR;    cath 3/03: normal cors, EF 40%  . Glucose intolerance (impaired glucose tolerance)   . History of chicken pox   . Allergic rhinitis     never tested, fall and spring  .  Sleep apnea     cpap settings 4  . Hypertension   . Shortness of breath dyspnea   . CHF (congestive heart failure)     Diastolic heart failure  . Diabetes mellitus without complication   . Diverticulosis     problems with frequent gas, burping  . Arthritis     hands,knees, feet  . Cataracts, bilateral     not surgical yet  . PONV (postoperative nausea and vomiting)   . Cancer     skin cancers only    Current Outpatient Prescriptions  Medication Sig Dispense Refill  . aspirin 81 MG EC tablet Take 81 mg by mouth daily.     . Calcium Citrate-Vitamin D 200-125 MG-UNIT TABS Take 250 Units by mouth 2 (two) times daily.     . carvedilol (COREG) 25 MG tablet TAKE 1 TABLET (25 MG TOTAL) BY MOUTH 2 (TWO) TIMES DAILY. 180 tablet 1  . Cholecalciferol (VITAMIN D-3) 1000 UNITS CAPS Take 1,000 Units by mouth 2 (two) times daily. GUMMIES    . Coenzyme Q10 (CO Q 10) 100 MG CAPS Take 100 mg by mouth at bedtime.     . Fish Oil OIL Take 227 mcg by mouth 2 (two) times daily. GUMMIES    . furosemide (LASIX) 40 MG tablet Take 1 tablet (40 mg total) by mouth 2 (two) times daily. (Patient taking differently: Take 40 mg by mouth daily. Take extra tablet once daily as needed for weight gain or swelling.) 120 tablet 6  . KLOR-CON 10 10 MEQ tablet TAKE 1 TABLET (10 MEQ TOTAL) BY MOUTH DAILY. 90 tablet 1  . losartan (COZAAR) 50 MG tablet TAKE 1/2 TABLET BY MOUTH EVERY DAY 90 tablet 1  . metFORMIN (GLUCOPHAGE) 500 MG tablet Take 1 tablet (500 mg total) by mouth 2 (two) times daily with a meal. 180 tablet 3  . mometasone (NASONEX) 50 MCG/ACT nasal spray Place 2 sprays into the nose daily. 17 g 11  . multivitamin (THERAGRAN) per tablet Take 1 tablet by mouth daily.     . polyvinyl alcohol (LIQUIFILM TEARS) 1.4 % ophthalmic solution Place 1 drop into both eyes every morning.    . simvastatin (ZOCOR) 20 MG tablet Take 1 tablet (20 mg total) by mouth at bedtime. 90 tablet 3  . [DISCONTINUED] potassium chloride  (K-DUR,KLOR-CON) 10 MEQ tablet Take 1 tablet (10 mEq total) by mouth daily. 90 tablet 3   No current facility-administered medications for this encounter.     Allergies:   Amoxicillin; Etodolac; Hctz; Meloxicam; and Sulfonamide derivatives   Social History:  The patient  reports that she has never smoked. She has never used smokeless tobacco. She reports that she does not drink alcohol or use illicit drugs.   Family History:  The patient's family history includes COPD in her father; Cancer  in her maternal grandfather and maternal grandmother; Cancer (age of onset: 18) in her mother; Depression in her sister; Diabetes in her father, paternal grandfather, and sister; Heart attack in her father; Heart disease in her father and paternal grandfather; Hyperlipidemia in her father; Hypertension in her brother and father; Lymphoma in her mother; Meniere's disease in her brother; Pneumonia in her father; Schizophrenia in her daughter; Stroke in her father. There is no history of Colon cancer or Stomach cancer.   ROS:  Please see the history of present illness.  All other systems reviewed and negative.   PHYSICAL EXAM: VS:  BP 106/59 mmHg  Pulse 86  Resp 18  Wt 188 lb (85.276 kg)  SpO2 94% Well nourished, well developed, in no acute distress HEENT: normal Neck: JVP 6 Cardiac:  normal S1, S2; RRR; no murmur Lungs:  Mild dry crackles at bases bilaterally.  Abd: soft, nontender, no hepatomegaly Ext: No edema.  Neuro:  CNs 2-12 intact, no focal abnormalities noted  EKG:  NSR, HR 76, leftward axis, nonspecific ST-T wave changes     ASSESSMENT AND PLAN:  1. Dyspnea 2. Chronic hypoxic respiratory failure on home O2 and CPAP  3. Chronic diastolic heart failure   4. Restrictive lung physiology 5. Obesity  She has had a very thorough cardiopulmonary work up for her dyspnea and exertional hypoxia. I think her dyspnea is related to restrictive lung disease and severe diastolic dysfunction with  exertional pulmonary HTN due to elevated left-side pressures with a normal PVR. Therapeutic options are limited (she is not candidate for selective pulmonary vasodilators with normal PVR).  However, she is generally doing well.   Recommendations: 1) Manage volume closely. Continue Lasix 40 mg daily, will check BMET/BNP today.  2) Wear O2 with all activity to keep sats >= 90% at all times.  I emphasized to her the need to wear oxygen with activity, oxygen saturation still < 90% with walking.  3) Lose weight: she is working on this.   Followup in 6 months.   Drianna Chandran French [place] 01/17/2015

## 2015-01-17 NOTE — Patient Instructions (Signed)
Labs today  Your physician recommends that you schedule a follow-up appointment in: 6 months  Do the following things EVERYDAY: 1) Weigh yourself in the morning before breakfast. Write it down and keep it in a log. 2) Take your medicines as prescribed 3) Eat low salt foods-Limit salt (sodium) to 2000 mg per day.  4) Stay as active as you can everyday 5) Limit all fluids for the day to less than 2 liters 6)

## 2015-01-20 ENCOUNTER — Ambulatory Visit (INDEPENDENT_AMBULATORY_CARE_PROVIDER_SITE_OTHER): Payer: Medicare Other | Admitting: *Deleted

## 2015-01-20 DIAGNOSIS — E1140 Type 2 diabetes mellitus with diabetic neuropathy, unspecified: Secondary | ICD-10-CM

## 2015-01-20 DIAGNOSIS — M2140 Flat foot [pes planus] (acquired), unspecified foot: Secondary | ICD-10-CM

## 2015-01-20 DIAGNOSIS — E1149 Type 2 diabetes mellitus with other diabetic neurological complication: Secondary | ICD-10-CM

## 2015-01-20 NOTE — Progress Notes (Signed)
DM shoe insert dispensed.

## 2015-01-26 ENCOUNTER — Encounter: Payer: Self-pay | Admitting: Internal Medicine

## 2015-01-26 ENCOUNTER — Ambulatory Visit (INDEPENDENT_AMBULATORY_CARE_PROVIDER_SITE_OTHER): Payer: Medicare Other | Admitting: Internal Medicine

## 2015-01-26 VITALS — BP 99/63 | HR 81 | Temp 97.7°F | Ht 64.5 in | Wt 190.0 lb

## 2015-01-26 DIAGNOSIS — E119 Type 2 diabetes mellitus without complications: Secondary | ICD-10-CM

## 2015-01-26 DIAGNOSIS — Z0000 Encounter for general adult medical examination without abnormal findings: Secondary | ICD-10-CM

## 2015-01-26 DIAGNOSIS — Z1239 Encounter for other screening for malignant neoplasm of breast: Secondary | ICD-10-CM | POA: Diagnosis not present

## 2015-01-26 LAB — COMPREHENSIVE METABOLIC PANEL
ALBUMIN: 4.2 g/dL (ref 3.5–5.2)
ALT: 22 U/L (ref 0–35)
AST: 22 U/L (ref 0–37)
Alkaline Phosphatase: 79 U/L (ref 39–117)
BUN: 14 mg/dL (ref 6–23)
CALCIUM: 9.4 mg/dL (ref 8.4–10.5)
CHLORIDE: 98 meq/L (ref 96–112)
CO2: 30 meq/L (ref 19–32)
Creatinine, Ser: 0.66 mg/dL (ref 0.40–1.20)
GFR: 94.67 mL/min (ref >60.00)
Glucose, Bld: 179 mg/dL — ABNORMAL HIGH (ref 70–99)
Potassium: 3.9 mEq/L (ref 3.5–5.1)
Sodium: 137 mEq/L (ref 135–145)
TOTAL PROTEIN: 7.5 g/dL (ref 6.0–8.3)
Total Bilirubin: 0.4 mg/dL (ref 0.2–1.2)

## 2015-01-26 LAB — LIPID PANEL
CHOL/HDL RATIO: 2
Cholesterol: 151 mg/dL (ref 0–200)
HDL: 60.6 mg/dL (ref >39.00)
NONHDL: 90.4
Triglycerides: 230 mg/dL — ABNORMAL HIGH (ref 0.0–149.0)
VLDL: 46 mg/dL — ABNORMAL HIGH (ref 0.0–40.0)

## 2015-01-26 LAB — CBC WITH DIFFERENTIAL/PLATELET
Basophils Absolute: 0 10*3/uL (ref 0.0–0.1)
Basophils Relative: 0.4 % (ref 0.0–3.0)
Eosinophils Absolute: 0.1 10*3/uL (ref 0.0–0.7)
Eosinophils Relative: 1.5 % (ref 0.0–5.0)
HCT: 39.5 % (ref 36.0–46.0)
Hemoglobin: 13.4 g/dL (ref 12.0–15.0)
Lymphocytes Relative: 30.9 % (ref 12.0–46.0)
Lymphs Abs: 1.9 10*3/uL (ref 0.7–4.0)
MCHC: 33.9 g/dL (ref 30.0–36.0)
MCV: 94.3 fl (ref 78.0–100.0)
Monocytes Absolute: 0.5 10*3/uL (ref 0.1–1.0)
Monocytes Relative: 8.1 % (ref 3.0–12.0)
Neutro Abs: 3.6 10*3/uL (ref 1.4–7.7)
Neutrophils Relative %: 59.1 % (ref 43.0–77.0)
PLATELETS: 234 10*3/uL (ref 150.0–400.0)
RBC: 4.19 Mil/uL (ref 3.87–5.11)
RDW: 12.9 % (ref 11.5–15.5)
WBC: 6.2 10*3/uL (ref 4.0–10.5)

## 2015-01-26 LAB — MICROALBUMIN / CREATININE URINE RATIO
Creatinine,U: 25 mg/dL
MICROALB/CREAT RATIO: 3.2 mg/g (ref 0.0–30.0)
Microalb, Ur: 0.8 mg/dL (ref 0.0–1.9)

## 2015-01-26 LAB — LDL CHOLESTEROL, DIRECT: Direct LDL: 53 mg/dL

## 2015-01-26 LAB — HEMOGLOBIN A1C: Hgb A1c MFr Bld: 6.4 % (ref 4.6–6.5)

## 2015-01-26 NOTE — Progress Notes (Signed)
Pre visit review using our clinic review tool, if applicable. No additional management support is needed unless otherwise documented below in the visit note. 

## 2015-01-26 NOTE — Progress Notes (Signed)
Subjective:    Patient ID: Betty [name], female    DOB: May 08, 1946, 69 y.o.   MRN: [id number]  HPI The patient is here for annual Medicare wellness examination and management of other chronic and acute problems.   The risk factors are reflected in the social history.  The roster of all physicians providing medical care to patient - is listed in the Snapshot section of the chart.  Activities of daily living:  The patient is 100% independent in all ADLs: dressing, toileting, feeding as well as independent mobility. Lives with husband. No pets.  Home safety : The patient has smoke detectors in the home. They wear seatbelts.  There are no firearms at home. There is no violence in the home.   There is no risks for hepatitis, STDs or HIV. There is no history of blood transfusion. They have no travel history to infectious disease endemic areas of the world.  The patient has seen their dentist in the last six month. (Dr. Metro Kung) They have seen their eye doctor in the last year. (Dr. Linton Flemings) No issues with hearing. They have deferred audiologic testing in the last year.   Pulmonary - Dr. Lake Bells and Dr. Randol Kern and Dr. Gilles Chiquito  They do not  have excessive sun exposure. Discussed the need for sun protection: hats, long sleeves and use of sunscreen if there is significant sun exposure. Dermatology - Dr. Evorn Gong  Podiatry - Dr. Elvina Mattes  Diet: the importance of a healthy diet is discussed. They do have a healthy diet.  The benefits of regular aerobic exercise were discussed. Started exercising some at local gym. Wearing inserts to help with foot pain.  Depression screen: there are no signs or vegative symptoms of depression- irritability, change in appetite, anhedonia, sadness/tearfullness.  Cognitive assessment: the patient manages all their financial and personal affairs and is actively engaged. They could relate day,date,year and events.  HCPOA - none at present  The following  portions of the patient's history were reviewed and updated as appropriate: allergies, current medications, past family history, past medical history,  past surgical history, past social history  and problem list.  Visual acuity was not assessed per patient preference since she has regular follow up with her ophthalmologist. Hearing and body mass index were assessed and reviewed.   During the course of the visit the patient was educated and counseled about appropriate screening and preventive services including : fall prevention , diabetes screening, nutrition counseling, colorectal cancer screening, and recommended immunizations.     Past medical, surgical, family and social history per today's encounter.  Review of Systems  Constitutional: Negative for fever, chills, appetite change, fatigue and unexpected weight change.  Eyes: Negative for visual disturbance.  Respiratory: Negative for shortness of breath.   Cardiovascular: Negative for chest pain and leg swelling.  Gastrointestinal: Negative for nausea, vomiting, abdominal pain, diarrhea and constipation.  Musculoskeletal: Negative for myalgias and arthralgias.  Skin: Negative for color change and rash.  Hematological: Negative for adenopathy. Does not bruise/bleed easily.  Psychiatric/Behavioral: Negative for sleep disturbance and dysphoric mood. The patient is not nervous/anxious.        Objective:    BP 99/63 mmHg  Pulse 81  Temp(Src) 97.7 F (36.5 C) (Oral)  Ht 5' 4.5" (1.638 m)  Wt 190 lb (86.183 kg)  BMI 32.12 kg/m2  SpO2 94% Physical Exam  Constitutional: She is oriented to person, place, and time. She appears well-developed and well-nourished. No distress.  HENT:  Head: Normocephalic and  atraumatic.  Right Ear: External ear normal.  Left Ear: External ear normal.  Nose: Nose normal.  Mouth/Throat: Oropharynx is clear and moist. No oropharyngeal exudate.  Eyes: Conjunctivae are normal. Pupils are equal, round, and  reactive to light. Right eye exhibits no discharge. Left eye exhibits no discharge. No scleral icterus.  Neck: Normal range of motion. Neck supple. No tracheal deviation present. No thyromegaly present.  Cardiovascular: Normal rate, regular rhythm, normal heart sounds and intact distal pulses.  Exam reveals no gallop and no friction rub.   No murmur heard. Pulmonary/Chest: Effort normal and breath sounds normal. No accessory muscle usage. No tachypnea. No respiratory distress. She has no decreased breath sounds. She has no wheezes. She has no rales. She exhibits no tenderness. Right breast exhibits no inverted nipple, no mass, no nipple discharge, no skin change and no tenderness. Left breast exhibits no inverted nipple, no mass, no nipple discharge, no skin change and no tenderness. Breasts are symmetrical.  Abdominal: Soft. Bowel sounds are normal. She exhibits no distension and no mass. There is no tenderness. There is no rebound and no guarding.  Musculoskeletal: Normal range of motion. She exhibits no edema or tenderness.  Lymphadenopathy:    She has no cervical adenopathy.  Neurological: She is alert and oriented to person, place, and time. No cranial nerve deficit. She exhibits normal muscle tone. Coordination normal.  Skin: Skin is warm and dry. No rash noted. She is not diaphoretic. No erythema. No pallor.  Psychiatric: She has a normal mood and affect. Her behavior is normal. Judgment and thought content normal.          Assessment & Plan:   Problem List Items Addressed This Visit      Unprioritized   Diabetes mellitus type 2, controlled   Relevant Orders   Comprehensive metabolic panel   Hemoglobin A1c   Lipid panel   Microalbumin / creatinine urine ratio   CBC with Differential/Platelet   Medicare annual wellness visit, subsequent - Primary    General medical exam including breast exam normal today. PAP and pelvic deferred as PAP normal 2014, HPV neg.  Mammogram ordered.  Colonoscopy UTD. Reviewed recent notes from [facility].  Encouraged healthy diet and exercise as tolerated. Labs as ordered. Appropriate screening performed. Follow up 3 months.           Other Visit Diagnoses    Screening for breast cancer        Relevant Orders    MM Digital Screening        Return in about 3 months (around 04/28/2015) for Recheck of Diabetes.

## 2015-01-26 NOTE — Patient Instructions (Signed)

## 2015-01-26 NOTE — Assessment & Plan Note (Signed)
General medical exam including breast exam normal today. PAP and pelvic deferred as PAP normal 2014, HPV neg.  Mammogram ordered. Colonoscopy UTD. Reviewed recent notes from [facility].  Encouraged healthy diet and exercise as tolerated. Labs as ordered. Appropriate screening performed. Follow up 3 months.

## 2015-01-27 LAB — HM DIABETES EYE EXAM

## 2015-01-31 ENCOUNTER — Ambulatory Visit
Admission: RE | Admit: 2015-01-31 | Discharge: 2015-01-31 | Disposition: A | Discharge status: Home / Self Care | Payer: Medicare Other | Source: Ambulatory Visit | Attending: Internal Medicine | Admitting: Internal Medicine

## 2015-01-31 ENCOUNTER — Other Ambulatory Visit: Payer: Self-pay | Admitting: Internal Medicine

## 2015-01-31 DIAGNOSIS — Z1239 Encounter for other screening for malignant neoplasm of breast: Secondary | ICD-10-CM

## 2015-01-31 DIAGNOSIS — Z1231 Encounter for screening mammogram for malignant neoplasm of breast: Secondary | ICD-10-CM | POA: Diagnosis present

## 2015-04-02 ENCOUNTER — Other Ambulatory Visit: Payer: Self-pay | Admitting: Internal Medicine

## 2015-04-20 ENCOUNTER — Other Ambulatory Visit: Payer: Self-pay | Admitting: Internal Medicine

## 2015-04-21 ENCOUNTER — Ambulatory Visit (INDEPENDENT_AMBULATORY_CARE_PROVIDER_SITE_OTHER): Payer: Medicare Other | Admitting: Internal Medicine

## 2015-04-21 ENCOUNTER — Encounter: Payer: Self-pay | Admitting: Internal Medicine

## 2015-04-21 VITALS — BP 108/69 | HR 73 | Temp 98.4°F | Ht 64.5 in | Wt 196.0 lb

## 2015-04-21 DIAGNOSIS — Z23 Encounter for immunization: Secondary | ICD-10-CM | POA: Diagnosis not present

## 2015-04-21 DIAGNOSIS — E119 Type 2 diabetes mellitus without complications: Secondary | ICD-10-CM

## 2015-04-21 DIAGNOSIS — Z1159 Encounter for screening for other viral diseases: Secondary | ICD-10-CM | POA: Diagnosis not present

## 2015-04-21 LAB — COMPREHENSIVE METABOLIC PANEL
ALBUMIN: 4.2 g/dL (ref 3.5–5.2)
ALK PHOS: 79 U/L (ref 39–117)
ALT: 22 U/L (ref 0–35)
AST: 22 U/L (ref 0–37)
BILIRUBIN TOTAL: 0.4 mg/dL (ref 0.2–1.2)
BUN: 16 mg/dL (ref 6–23)
CO2: 33 mEq/L — ABNORMAL HIGH (ref 19–32)
Calcium: 9.6 mg/dL (ref 8.4–10.5)
Chloride: 99 mEq/L (ref 96–112)
Creatinine, Ser: 0.66 mg/dL (ref 0.40–1.20)
GFR: 94.6 mL/min (ref >60.00)
GLUCOSE: 181 mg/dL — AB (ref 70–99)
Potassium: 3.9 mEq/L (ref 3.5–5.1)
SODIUM: 140 meq/L (ref 135–145)
TOTAL PROTEIN: 7.3 g/dL (ref 6.0–8.3)

## 2015-04-21 LAB — HEMOGLOBIN A1C: Hgb A1c MFr Bld: 6.7 % — ABNORMAL HIGH (ref 4.6–6.5)

## 2015-04-21 NOTE — Assessment & Plan Note (Signed)
Wt Readings from Last 3 Encounters:  04/21/15 196 lb (88.905 kg)  01/26/15 190 lb (86.183 kg)  01/17/15 188 lb (85.276 kg)   Body mass index is 33.14 kg/(m^2). Encouraged healthy diet and exercise.

## 2015-04-21 NOTE — Progress Notes (Signed)
Pre visit review using our clinic review tool, if applicable. No additional management support is needed unless otherwise documented below in the visit note. 

## 2015-04-21 NOTE — Assessment & Plan Note (Signed)
Will repeat A1c with labs today. Continue off Metformin, given diarrhea on medication.

## 2015-04-21 NOTE — Assessment & Plan Note (Signed)
Discussed rational for screening test for Hep C. Screening ordered today.

## 2015-04-21 NOTE — Patient Instructions (Signed)
Labs today.  Follow up in 6 months. 

## 2015-04-21 NOTE — Progress Notes (Signed)
Subjective:    Patient ID: Betty [name], female    DOB: 01-31-1946, 69 y.o.   MRN: [id number]  HPI  69YO female presents for follow up.  DM - Stopped taking Metformin last visit because of diarrhea. Does not check BG. Diarrhea has resolved.  Otherwise, feeling well. No concerns today. No respiratory issues this summer.  Planning to work on weight loss. Frustrated by weight gain. Plans to limit caloric intake.   BP Readings from Last 3 Encounters:  04/21/15 108/69  01/26/15 99/63  01/17/15 106/59   Wt Readings from Last 3 Encounters:  04/21/15 196 lb (88.905 kg)  01/26/15 190 lb (86.183 kg)  01/17/15 188 lb (85.276 kg)     Past Medical History  Diagnosis Date  . Hyperlipidemia   . Gout   . NICM (nonischemic cardiomyopathy) 2002    EF 25%; improved to normal - echo 4/08: EF 50-55%, mild MR, mild LAE, mild TR;    cath 3/03: normal cors, EF 40%  . Glucose intolerance (impaired glucose tolerance)   . History of chicken pox   . Allergic rhinitis     never tested, fall and spring  . Sleep apnea     cpap settings 4  . Hypertension   . Shortness of breath dyspnea   . CHF (congestive heart failure)     Diastolic heart failure  . Diabetes mellitus without complication   . Diverticulosis     problems with frequent gas, burping  . Arthritis     hands,knees, feet  . Cataracts, bilateral     not surgical yet  . PONV (postoperative nausea and vomiting)   . Cancer     skin cancers only   Family History  Problem Relation Age of Onset  . Lymphoma Mother   . Cancer Mother 57    Lymphoma  . COPD Father     was a smoker  . Stroke Father   . Pneumonia Father   . Diabetes Father   . Hyperlipidemia Father   . Heart disease Father   . Diabetes Sister   . Depression Sister   . Meniere's disease Brother   . Diabetes Paternal Grandfather   . Heart disease Paternal Grandfather   . Schizophrenia Daughter   . Cancer Maternal Grandmother     Bladder  . Cancer Maternal  Grandfather     leukemia  . Colon cancer Neg Hx   . Stomach cancer Neg Hx   . Heart attack Father   . Hypertension Brother   . Hypertension Father    Past Surgical History  Procedure Laterality Date  . Nsvd      x 2  . Cystectomy  1996    l breast  . Choleystectomy  02/2002  . Vaginal delivery      x2  . Tubal ligation  1975  . Dilation and curettage of uterus  2011    Dr. Hulan Fray at [facility]  . Induced abortion    . Cardiac catheterization    . Bilateral vats ablation Right     biopsy done 2015- [facility]  . Colonoscopy with propofol N/A 12/20/2014    Procedure: COLONOSCOPY WITH PROPOFOL;  Surgeon: [name], MD;  Location: [facility];  Service: Gastroenterology;  Laterality: N/A;  . Breast biopsy Left 80's    Cyst removed   Social History   Social History  . Marital Status: Married    Spouse Name: N/A  . Number of Children: 2  . Years  of Education: N/A   Occupational History  . Retired Pharmacist, hospital   . Works at [employer]  . Smoking status: Never Smoker   . Smokeless tobacco: Never Used  . Alcohol Use: No  . Drug Use: No  . Sexual Activity: No   Other Topics Concern  . None   Social History Narrative   Lives in [place] with husband. 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 No pets.    Review of Systems  Constitutional: Negative for fever, chills, appetite change, fatigue and unexpected weight change.  Eyes: Negative for visual disturbance.  Respiratory: Negative for shortness of breath.   Cardiovascular: Negative for chest pain and leg swelling.  Gastrointestinal: Negative for nausea, vomiting, abdominal pain, diarrhea and constipation.  Musculoskeletal: Negative for myalgias and arthralgias.  Skin: Negative for color change and rash.  Hematological: Negative for adenopathy. Does not bruise/bleed easily.  Psychiatric/Behavioral: Negative for sleep disturbance and dysphoric mood. The patient is not  nervous/anxious.        Objective:    BP 108/69 mmHg  Pulse 73  Temp(Src) 98.4 F (36.9 C) (Oral)  Ht 5' 4.5" (1.638 m)  Wt 196 lb (88.905 kg)  BMI 33.14 kg/m2  SpO2 92% Physical Exam  Constitutional: She is oriented to person, place, and time. She appears well-developed and well-nourished. No distress.  HENT:  Head: Normocephalic and atraumatic.  Right Ear: External ear normal.  Left Ear: External ear normal.  Nose: Nose normal.  Mouth/Throat: Oropharynx is clear and moist. No oropharyngeal exudate.  Eyes: Conjunctivae are normal. Pupils are equal, round, and reactive to light. Right eye exhibits no discharge. Left eye exhibits no discharge. No scleral icterus.  Neck: Normal range of motion. Neck supple. No tracheal deviation present. No thyromegaly present.  Cardiovascular: Normal rate, regular rhythm, normal heart sounds and intact distal pulses.  Exam reveals no gallop and no friction rub.   No murmur heard. Pulmonary/Chest: Effort normal and breath sounds normal. No respiratory distress. She has no wheezes. She has no rales. She exhibits no tenderness.  Musculoskeletal: Normal range of motion. She exhibits no edema or tenderness.  Lymphadenopathy:    She has no cervical adenopathy.  Neurological: She is alert and oriented to person, place, and time. No cranial nerve deficit. She exhibits normal muscle tone. Coordination normal.  Skin: Skin is warm and dry. No rash noted. She is not diaphoretic. No erythema. No pallor.  Psychiatric: She has a normal mood and affect. Her behavior is normal. Judgment and thought content normal.          Assessment & Plan:   Problem List Items Addressed This Visit      Unprioritized   Diabetes mellitus type 2, controlled - Primary    Will repeat A1c with labs today. Continue off Metformin, given diarrhea on medication.      Relevant Orders   Comprehensive metabolic panel   Hemoglobin A1c   Need for hepatitis C screening test     Discussed rational for screening test for Hep C. Screening ordered today.      Relevant Orders   Hepatitis C antibody   Obesity (BMI 30-39.9)    Wt Readings from Last 3 Encounters:  04/21/15 196 lb (88.905 kg)  01/26/15 190 lb (86.183 kg)  01/17/15 188 lb (85.276 kg)   Body mass index is 33.14 kg/(m^2). Encouraged healthy diet and exercise.          Return in about  6 months (around 10/22/2015) for Recheck of Diabetes.

## 2015-04-22 LAB — HEPATITIS C ANTIBODY: HCV AB: NEGATIVE

## 2015-04-28 ENCOUNTER — Other Ambulatory Visit (HOSPITAL_COMMUNITY): Payer: Self-pay | Admitting: *Deleted

## 2015-04-28 MED ORDER — POTASSIUM CHLORIDE ER 10 MEQ PO TBCR: EXTENDED_RELEASE_TABLET | ORAL | Status: DC

## 2015-04-29 ENCOUNTER — Other Ambulatory Visit (HOSPITAL_COMMUNITY): Payer: Self-pay | Admitting: *Deleted

## 2015-04-29 MED ORDER — POTASSIUM CHLORIDE ER 10 MEQ PO TBCR: EXTENDED_RELEASE_TABLET | ORAL | Status: DC

## 2015-05-02 ENCOUNTER — Other Ambulatory Visit: Payer: Self-pay | Admitting: Physician Assistant

## 2015-05-04 ENCOUNTER — Telehealth (HOSPITAL_COMMUNITY): Payer: Self-pay | Admitting: *Deleted

## 2015-05-04 MED ORDER — POTASSIUM CHLORIDE CRYS ER 20 MEQ PO TBCR: EXTENDED_RELEASE_TABLET | ORAL | Status: DC

## 2015-05-04 NOTE — Telephone Encounter (Signed)
Pt called concerned about her KCL rx she just picked up, the rx is for kcl 10 meq daily however she states she has been on 20 meq 2 tabs in AM and1 tab in PM for as long as she can remember.  Upon review of chart in Dec pt was on 40/20 and in May it is changed to 10 daily on med list but I can not find a reason med was changed and pt states she doesn't recall anyone telling her to change dose.  Pt had labs at pcp on 8/25 and K was 3.9 discussed w/Erika pharm D she recommends pt continue the 40/20 dosing to keep K level up.  Pt is aware and agreeable, new rx sent to pharmacy

## 2015-05-10 ENCOUNTER — Encounter: Payer: Self-pay | Admitting: Internal Medicine

## 2015-06-01 ENCOUNTER — Other Ambulatory Visit (HOSPITAL_COMMUNITY): Payer: Self-pay | Admitting: Internal Medicine

## 2015-07-11 ENCOUNTER — Other Ambulatory Visit (HOSPITAL_COMMUNITY): Payer: Self-pay | Admitting: *Deleted

## 2015-07-11 ENCOUNTER — Other Ambulatory Visit (HOSPITAL_COMMUNITY): Payer: Self-pay | Admitting: Internal Medicine

## 2015-07-11 DIAGNOSIS — I5032 Chronic diastolic (congestive) heart failure: Secondary | ICD-10-CM

## 2015-07-11 MED ORDER — FUROSEMIDE 40 MG PO TABS: 40.0000 mg | ORAL_TABLET | Freq: Every day | ORAL | Status: DC

## 2015-07-11 NOTE — Telephone Encounter (Signed)
Per pt request, 15 tabs of Lasix sent to [facility]

## 2015-07-26 ENCOUNTER — Encounter (HOSPITAL_COMMUNITY): Payer: Self-pay | Admitting: Internal Medicine

## 2015-07-26 ENCOUNTER — Ambulatory Visit (HOSPITAL_COMMUNITY)
Admission: RE | Admit: 2015-07-26 | Discharge: 2015-07-26 | Disposition: A | Discharge status: Home / Self Care | Payer: Medicare Other | Source: Ambulatory Visit | Attending: Internal Medicine | Admitting: Internal Medicine

## 2015-07-26 VITALS — BP 108/66 | HR 71 | Wt 182.8 lb

## 2015-07-26 DIAGNOSIS — I272 Other secondary pulmonary hypertension: Secondary | ICD-10-CM | POA: Insufficient documentation

## 2015-07-26 DIAGNOSIS — I5032 Chronic diastolic (congestive) heart failure: Secondary | ICD-10-CM | POA: Insufficient documentation

## 2015-07-26 DIAGNOSIS — R7302 Impaired glucose tolerance (oral): Secondary | ICD-10-CM | POA: Diagnosis not present

## 2015-07-26 DIAGNOSIS — Z85828 Personal history of other malignant neoplasm of skin: Secondary | ICD-10-CM | POA: Diagnosis not present

## 2015-07-26 DIAGNOSIS — E119 Type 2 diabetes mellitus without complications: Secondary | ICD-10-CM | POA: Diagnosis not present

## 2015-07-26 DIAGNOSIS — I5189 Other ill-defined heart diseases: Secondary | ICD-10-CM | POA: Insufficient documentation

## 2015-07-26 DIAGNOSIS — J984 Other disorders of lung: Secondary | ICD-10-CM | POA: Diagnosis not present

## 2015-07-26 DIAGNOSIS — Z880 Allergy status to penicillin: Secondary | ICD-10-CM | POA: Diagnosis not present

## 2015-07-26 DIAGNOSIS — R0600 Dyspnea, unspecified: Secondary | ICD-10-CM

## 2015-07-26 DIAGNOSIS — Z79899 Other long term (current) drug therapy: Secondary | ICD-10-CM | POA: Insufficient documentation

## 2015-07-26 DIAGNOSIS — I10 Essential (primary) hypertension: Secondary | ICD-10-CM | POA: Insufficient documentation

## 2015-07-26 DIAGNOSIS — Z9981 Dependence on supplemental oxygen: Secondary | ICD-10-CM | POA: Diagnosis not present

## 2015-07-26 DIAGNOSIS — G4733 Obstructive sleep apnea (adult) (pediatric): Secondary | ICD-10-CM | POA: Insufficient documentation

## 2015-07-26 DIAGNOSIS — Z7982 Long term (current) use of aspirin: Secondary | ICD-10-CM | POA: Diagnosis not present

## 2015-07-26 DIAGNOSIS — E669 Obesity, unspecified: Secondary | ICD-10-CM | POA: Insufficient documentation

## 2015-07-26 DIAGNOSIS — I429 Cardiomyopathy, unspecified: Secondary | ICD-10-CM | POA: Insufficient documentation

## 2015-07-26 DIAGNOSIS — E785 Hyperlipidemia, unspecified: Secondary | ICD-10-CM | POA: Insufficient documentation

## 2015-07-26 DIAGNOSIS — J9611 Chronic respiratory failure with hypoxia: Secondary | ICD-10-CM | POA: Diagnosis not present

## 2015-07-26 DIAGNOSIS — M109 Gout, unspecified: Secondary | ICD-10-CM | POA: Diagnosis not present

## 2015-07-26 DIAGNOSIS — R0902 Hypoxemia: Secondary | ICD-10-CM | POA: Insufficient documentation

## 2015-07-26 LAB — BASIC METABOLIC PANEL
Anion gap: 9 (ref 5–15)
BUN: 14 mg/dL (ref 6–20)
CALCIUM: 9.7 mg/dL (ref 8.9–10.3)
CO2: 29 mmol/L (ref 22–32)
CREATININE: 0.6 mg/dL (ref 0.44–1.00)
Chloride: 103 mmol/L (ref 101–111)
GFR calc Af Amer: >60 mL/min (ref >60)
Glucose, Bld: 104 mg/dL — ABNORMAL HIGH (ref 65–99)
POTASSIUM: 3.8 mmol/L (ref 3.5–5.1)
SODIUM: 141 mmol/L (ref 135–145)

## 2015-07-26 LAB — BRAIN NATRIURETIC PEPTIDE: B NATRIURETIC PEPTIDE 5: 32.2 pg/mL (ref 0.0–100.0)

## 2015-07-26 MED ORDER — CARVEDILOL 12.5 MG PO TABS: 12.5000 mg | ORAL_TABLET | Freq: Two times a day (BID) | ORAL | Status: DC

## 2015-07-26 NOTE — Addendum Note (Signed)
Encounter addended by: Scarlette Calico, RN on: 07/26/2015 12:02 PM<BR>     Documentation filed: Dx Association, Patient Instructions Section, Orders

## 2015-07-26 NOTE — Patient Instructions (Signed)
Decrease Carvedilol to 12.5 mg Twice daily, we have sent you in a prescription for 12.5 mg tablets.  If your SBP is consistently 140 or greater can increase back to 25 mg Twice daily   Labs today  Your physician has requested that you have an echocardiogram. Echocardiography is a painless test that uses sound waves to create images of your heart. It provides your doctor with information about the size and shape of your heart and how well your heart's chambers and valves are working. This procedure takes approximately one hour. There are no restrictions for this procedure.  AT [facility]   We will contact you in 6 months to schedule your next appointment.

## 2015-07-26 NOTE — Progress Notes (Signed)
Patient ID: Betty [name], female   DOB: 02-11-1946, 69 y.o.   MRN: [id number] Patient ID: Betty [name], female   DOB: 1946-02-25, 69 y.o.   MRN: [id number]   Cardiology Office Note  Date:  07/26/2015   ID:  Betty [name], Betty [name] March 11, 1946, MRN [id number]  PCP:  Rica Mast, MD  Cardiologist:  Dr. Dorris Carnes      History of Present Illness: Betty [name] is a 69 y.o. female with a hx of NICM with previous EF 25% (improved to normal), HL, gout, glucose intol, normal cors by [facility] in 2003, OSA. Referred by Dr. Harrington Challenger for further evaluation of exertional dyspnea.   She developed increasing shortness of breath over the course of 2 years about 2011 to 2013. She had an extensive Pulmonary work-up at [facility] and has been seen by Dr. Lake Bells. Pulmonary function testing showed restrictive lung disease with a markedly depressed DLCO at 35% predicted. CT scanning of her chest showed upper lobe groundglass abnormalities versus air trapping. A home polysomnogram showed an AHI of 12 and multiple desaturation events to 80% independent of apneas. An abg showed resting hypercarbia (pCO2 52), and a MIP/MEP was 36%/28% predicted. A follow up diaphragm study was normal. A repeat CT scan was performed, no pulmonary embolism was seen and radiology commented on atelectasis but no clear intersitial lung disease. Blood work including an HP panel and serologies for connective tissue diseases has been essentially negative (two partial aspergillus bands on HP panel). Two echocardiograms have not shown evidence of pulmonary hypertension. After starting on CPAP with nasal pillows and oxygen with exertion and sleep she started feeling better.  She had an open lung biopsy at [facility] in November of 2014 which showed small amounts of fibrosis around her pulmonary arteries as well as findings consistent with very mild bronchiolitis.  Resting RHC was normal.  Referred to [facility] (Dr. Gilles Chiquito).  RHC with  exercise in 3/15 showed marked increase in pulmonary pressures and PCWP with exercise with no change in PVR. O2 sats down to 77% with exercise. Corrected with O2. PA rest 36/20 (26)  PCWP 15 with exercise PA 78/40 (56) PCWP 28  This was felt to reflect diastolic dysfunction and intrinsic lung disease.  Lung bx was not suggestive of clear lung pathology.  CT was neg for PE.  Continued management of diastolic CHF has been recommended.     Returns for f/u. She says she is doing quite well. Using CPAP and her oxygen regularly at night. Going to Heart Track at [facility].  She is not using oxygen during the day. Sats at Heart Track stay above 90%  No dyspnea walking on flat ground.  Able to walk around in stores without problems. No chest pain.  No orthopnea/PND.  No syncope/lightheadedness.  She has been taking Lasix 40 mg daily. If she takes more gets volume depleted. Not taking extra lasix. No edema. Lost 15 pounds with Paleo diet. BP drops during exercise at times.     Studies:  - RHC (5/14):  RA 9, RV 32/8, PA 30/16/23, PCWP 12, 2.1 WU, CO 5.3, CI 2.7  - Echo (3/14):  EF 55-60%, no RWMA, Gr 1 DD, mild MR  - Nuclear (11/13):  Breast atten, no ischemia, EF 55%; Low Risk   - PFTs. [place] 2014: FVC 1.46 L (48% pred), TLC 2.77 L (55% pred) ERV 0.03 (3% pred), DLCO 8.4 (35% pred)  Recent Labs/Images: 08/05/2014: Pro B Natriuretic peptide (BNP) 36.4  01/17/2015: B Natriuretic Peptide 11.6 01/26/2015: Direct LDL 53.0; HDL 60.60; Hemoglobin 13.4 04/21/2015: ALT 22; Creatinine, Ser 0.66; Potassium 3.9   Wt Readings from Last 3 Encounters:  07/26/15 182 lb 12 oz (82.895 kg)  04/21/15 196 lb (88.905 kg)  01/26/15 190 lb (86.183 kg)     Past Medical History  Diagnosis Date  . Hyperlipidemia   . Gout   . NICM (nonischemic cardiomyopathy) ([place]) 2002    EF 25%; improved to normal - echo 4/08: EF 50-55%, mild MR, mild LAE, mild TR;    cath 3/03: normal cors, EF 40%  . Glucose intolerance (impaired glucose  tolerance)   . History of chicken pox   . Allergic rhinitis     never tested, fall and spring  . Sleep apnea     cpap settings 4  . Hypertension   . Shortness of breath dyspnea   . CHF (congestive heart failure) (HCC)     Diastolic heart failure  . Diabetes mellitus without complication ([place])   . Diverticulosis     problems with frequent gas, burping  . Arthritis     hands,knees, feet  . Cataracts, bilateral     not surgical yet  . PONV (postoperative nausea and vomiting)   . Cancer [facility])     skin cancers only    Current Outpatient Prescriptions  Medication Sig Dispense Refill  . aspirin 81 MG EC tablet Take 81 mg by mouth daily.     . Calcium Citrate-Vitamin D 200-125 MG-UNIT TABS Take 250 Units by mouth 2 (two) times daily.     . carvedilol (COREG) 25 MG tablet TAKE 1 TABLET (25 MG TOTAL) BY MOUTH 2 (TWO) TIMES DAILY. 180 tablet 1  . Cholecalciferol (VITAMIN D-3) 1000 UNITS CAPS Take 1,000 Units by mouth 2 (two) times daily. GUMMIES    . Coenzyme Q10 (CO Q 10) 100 MG CAPS Take 100 mg by mouth at bedtime.     . Fish Oil OIL Take 227 mcg by mouth 2 (two) times daily. GUMMIES    . furosemide (LASIX) 40 MG tablet Take 1 tablet (40 mg total) by mouth daily. Take extra tablet once daily as needed for weight gain or swelling. 15 tablet 6  . KLOR-CON M20 20 MEQ tablet TAKE 2 TABLETS EVERY MORNING AND 1 TABLET EACH EVENING 90 tablet 9  . losartan (COZAAR) 50 MG tablet TAKE 1/2 TABLET BY MOUTH EVERY DAY 90 tablet 1  . mometasone (NASONEX) 50 MCG/ACT nasal spray Place 2 sprays into the nose daily. 17 g 11  . multivitamin (THERAGRAN) per tablet Take 1 tablet by mouth daily.     . polyvinyl alcohol (LIQUIFILM TEARS) 1.4 % ophthalmic solution Place 1 drop into both eyes every morning.    . simvastatin (ZOCOR) 20 MG tablet TAKE 1 TABLET BY MOUTH AT BEDTIME. 90 tablet 3   No current facility-administered medications for this encounter.     Allergies:   Amoxicillin; Etodolac; Hctz;  Meloxicam; Metformin and related; and Sulfonamide derivatives   Social History:  The patient  reports that she has never smoked. She has never used smokeless tobacco. She reports that she does not drink alcohol or use illicit drugs.   Family History:  The patient's family history includes COPD in her father; Cancer in her maternal grandfather and maternal grandmother; Cancer (age of onset: 72) in her mother; Depression in her sister; Diabetes in her father, paternal grandfather, and sister; Heart attack in her father; Heart disease in her  father and paternal grandfather; Hyperlipidemia in her father; Hypertension in her brother and father; Lymphoma in her mother; Meniere's disease in her brother; Pneumonia in her father; Schizophrenia in her daughter; Stroke in her father. There is no history of Colon cancer or Stomach cancer.   ROS:  Please see the history of present illness.  All other systems reviewed and negative.   PHYSICAL EXAM: VS:  BP 108/66 mmHg  Pulse 71  Wt 182 lb 12 oz (82.895 kg)  SpO2 98% Well nourished, well developed, in no acute distress HEENT: normal Neck: JVP flat Cardiac:  normal S1, S2; RRR; no murmur Lungs:  Clear Abd: soft, nontender, no hepatomegaly Ext: No edema.  Neuro:  CNs 2-12 intact, no focal abnormalities noted  EKG:  None today  ASSESSMENT AND PLAN:  1. Dyspnea, much improved 2. Chronic hypoxic respiratory failure on home O2 and CPAP  3. Chronic diastolic heart failure   4. Restrictive lung physiology 5. Obesity  She has had a very thorough cardiopulmonary work up for her dyspnea and exertional hypoxia. I think her dyspnea is related to restrictive lung disease and severe diastolic dysfunction with exertional pulmonary HTN due to elevated left-side pressures with a normal PVR. Therapeutic options are limited (she is not candidate for selective pulmonary vasodilators with normal PVR).  However, she is generally doing well.   She is now much improved  with O2 and weight loss. Will order repeat echo and also check labs today to make sure renal function stable. Continue Heart Track. BP low at times. Will cut carvedilol to 12.5 bid. If BP goes back over 140 can increase back up. g   Betty Weinand,MD 07/26/2015

## 2015-07-27 ENCOUNTER — Ambulatory Visit
Admission: RE | Admit: 2015-07-27 | Discharge: 2015-07-27 | Disposition: A | Discharge status: Home / Self Care | Payer: Medicare Other | Source: Ambulatory Visit | Attending: Internal Medicine | Admitting: Internal Medicine

## 2015-07-27 DIAGNOSIS — I5032 Chronic diastolic (congestive) heart failure: Secondary | ICD-10-CM | POA: Diagnosis not present

## 2015-07-27 NOTE — Progress Notes (Signed)
*  PRELIMINARY RESULTS* Echocardiogram 2D Echocardiogram has been performed.  Betty [name] 07/27/2015, 10:44 AM

## 2015-08-01 ENCOUNTER — Telehealth (HOSPITAL_COMMUNITY): Payer: Self-pay

## 2015-08-01 NOTE — Telephone Encounter (Signed)
Patient called CHF traige line for results of labs and echo. All WNL, patient made aware.  Renee Pain

## 2015-08-28 DIAGNOSIS — R0902 Hypoxemia: Secondary | ICD-10-CM

## 2015-08-28 HISTORY — DX: Hypoxemia: R09.02

## 2015-10-21 ENCOUNTER — Other Ambulatory Visit: Payer: Self-pay | Admitting: Internal Medicine

## 2015-10-21 NOTE — Telephone Encounter (Signed)
Sent to the pharmacy by e-scribe.  Pt has upcoming appt on 01/27/16 for yearly.

## 2015-11-24 ENCOUNTER — Other Ambulatory Visit (HOSPITAL_COMMUNITY): Payer: Self-pay | Admitting: Pharmacist

## 2015-11-24 MED ORDER — CARVEDILOL 12.5 MG PO TABS: 12.5000 mg | ORAL_TABLET | Freq: Two times a day (BID) | ORAL | Status: DC

## 2015-11-29 ENCOUNTER — Other Ambulatory Visit (HOSPITAL_COMMUNITY): Payer: Self-pay | Admitting: *Deleted

## 2015-11-29 MED ORDER — CARVEDILOL 12.5 MG PO TABS: 12.5000 mg | ORAL_TABLET | Freq: Two times a day (BID) | ORAL | Status: DC

## 2015-11-29 MED ORDER — POTASSIUM CHLORIDE CRYS ER 20 MEQ PO TBCR: EXTENDED_RELEASE_TABLET | ORAL | Status: DC

## 2015-12-29 ENCOUNTER — Other Ambulatory Visit: Payer: Self-pay | Admitting: Internal Medicine

## 2015-12-29 DIAGNOSIS — Z1231 Encounter for screening mammogram for malignant neoplasm of breast: Secondary | ICD-10-CM

## 2016-01-27 ENCOUNTER — Ambulatory Visit (INDEPENDENT_AMBULATORY_CARE_PROVIDER_SITE_OTHER): Payer: Medicare Other | Admitting: Internal Medicine

## 2016-01-27 ENCOUNTER — Encounter: Payer: Self-pay | Admitting: Internal Medicine

## 2016-01-27 VITALS — BP 104/62 | HR 78 | Ht 65.0 in | Wt 183.2 lb

## 2016-01-27 DIAGNOSIS — Z0001 Encounter for general adult medical examination with abnormal findings: Secondary | ICD-10-CM | POA: Insufficient documentation

## 2016-01-27 DIAGNOSIS — Z0000 Encounter for general adult medical examination without abnormal findings: Secondary | ICD-10-CM | POA: Diagnosis not present

## 2016-01-27 DIAGNOSIS — E119 Type 2 diabetes mellitus without complications: Secondary | ICD-10-CM | POA: Diagnosis not present

## 2016-01-27 LAB — CBC WITH DIFFERENTIAL/PLATELET
BASOS ABS: 0 10*3/uL (ref 0.0–0.1)
Basophils Relative: 0.7 % (ref 0.0–3.0)
EOS ABS: 0.1 10*3/uL (ref 0.0–0.7)
Eosinophils Relative: 1.7 % (ref 0.0–5.0)
HCT: 41.5 % (ref 36.0–46.0)
Hemoglobin: 14.1 g/dL (ref 12.0–15.0)
LYMPHS ABS: 2.3 10*3/uL (ref 0.7–4.0)
Lymphocytes Relative: 33.6 % (ref 12.0–46.0)
MCHC: 34 g/dL (ref 30.0–36.0)
MCV: 95.4 fl (ref 78.0–100.0)
Monocytes Absolute: 0.7 10*3/uL (ref 0.1–1.0)
Monocytes Relative: 10 % (ref 3.0–12.0)
NEUTROS ABS: 3.8 10*3/uL (ref 1.4–7.7)
NEUTROS PCT: 54 % (ref 43.0–77.0)
PLATELETS: 263 10*3/uL (ref 150.0–400.0)
RBC: 4.35 Mil/uL (ref 3.87–5.11)
RDW: 13 % (ref 11.5–15.5)
WBC: 7 10*3/uL (ref 4.0–10.5)

## 2016-01-27 LAB — COMPREHENSIVE METABOLIC PANEL
ALBUMIN: 4.4 g/dL (ref 3.5–5.2)
ALT: 22 U/L (ref 0–35)
AST: 22 U/L (ref 0–37)
Alkaline Phosphatase: 88 U/L (ref 39–117)
BUN: 20 mg/dL (ref 6–23)
CHLORIDE: 101 meq/L (ref 96–112)
CO2: 31 meq/L (ref 19–32)
CREATININE: 0.6 mg/dL (ref 0.40–1.20)
Calcium: 10 mg/dL (ref 8.4–10.5)
GFR: 105.36 mL/min (ref >60.00)
GLUCOSE: 97 mg/dL (ref 70–99)
Potassium: 4.2 mEq/L (ref 3.5–5.1)
SODIUM: 141 meq/L (ref 135–145)
Total Bilirubin: 0.4 mg/dL (ref 0.2–1.2)
Total Protein: 7.6 g/dL (ref 6.0–8.3)

## 2016-01-27 LAB — MICROALBUMIN / CREATININE URINE RATIO
CREATININE, U: 9.9 mg/dL
Microalb Creat Ratio: 7.1 mg/g (ref 0.0–30.0)

## 2016-01-27 LAB — LIPID PANEL
CHOL/HDL RATIO: 3
Cholesterol: 180 mg/dL (ref 0–200)
HDL: 67.4 mg/dL (ref >39.00)
NONHDL: 112.48
Triglycerides: 366 mg/dL — ABNORMAL HIGH (ref 0.0–149.0)
VLDL: 73.2 mg/dL — AB (ref 0.0–40.0)

## 2016-01-27 LAB — LDL CHOLESTEROL, DIRECT: Direct LDL: 60 mg/dL

## 2016-01-27 LAB — HEMOGLOBIN A1C: Hgb A1c MFr Bld: 6.1 % (ref 4.6–6.5)

## 2016-01-27 LAB — HM DIABETES FOOT EXAM

## 2016-01-27 NOTE — Patient Instructions (Signed)
Health Maintenance, Female Adopting a healthy lifestyle and getting preventive care can go a long way to promote health and wellness. Talk with your health care provider about what schedule of regular examinations is right for you. This is a good chance for you to check in with your provider about disease prevention and staying healthy. In between checkups, there are plenty of things you can do on your own. Experts have done a lot of research about which lifestyle changes and preventive measures are most likely to keep you healthy. Ask your health care provider for more information. WEIGHT AND DIET  Eat a healthy diet  Be sure to include plenty of vegetables, fruits, low-fat dairy products, and lean protein.  Do not eat a lot of foods high in solid fats, added sugars, or salt.  Get regular exercise. This is one of the most important things you can do for your health.  Most adults should exercise for at least 150 minutes each week. The exercise should increase your heart rate and make you sweat (moderate-intensity exercise).  Most adults should also do strengthening exercises at least twice a week. This is in addition to the moderate-intensity exercise.  Maintain a healthy weight  Body mass index (BMI) is a measurement that can be used to identify possible weight problems. It estimates body fat based on height and weight. Your health care provider can help determine your BMI and help you achieve or maintain a healthy weight.  For females 20 years of age and older:   A BMI below 18.5 is considered underweight.  A BMI of 18.5 to 24.9 is normal.  A BMI of 25 to 29.9 is considered overweight.  A BMI of 30 and above is considered obese.  Watch levels of cholesterol and blood lipids  You should start having your blood tested for lipids and cholesterol at 69 years of age, then have this test every 5 years.  You may need to have your cholesterol levels checked more often if:  Your lipid  or cholesterol levels are high.  You are older than 69 years of age.  You are at high risk for heart disease.  CANCER SCREENING   Lung Cancer  Lung cancer screening is recommended for adults 55-80 years old who are at high risk for lung cancer because of a history of smoking.  A yearly low-dose CT scan of the lungs is recommended for people who:  Currently smoke.  Have quit within the past 15 years.  Have at least a 30-pack-year history of smoking. A pack year is smoking an average of one pack of cigarettes a day for 1 year.  Yearly screening should continue until it has been 15 years since you quit.  Yearly screening should stop if you develop a health problem that would prevent you from having lung cancer treatment.  Breast Cancer  Practice breast self-awareness. This means understanding how your breasts normally appear and feel.  It also means doing regular breast self-exams. Let your health care provider know about any changes, no matter how small.  If you are in your 20s or 30s, you should have a clinical breast exam (CBE) by a health care provider every 1-3 years as part of a regular health exam.  If you are 40 or older, have a CBE every year. Also consider having a breast X-ray (mammogram) every year.  If you have a family history of breast cancer, talk to your health care provider about genetic screening.  If you   are at high risk for breast cancer, talk to your health care provider about having an MRI and a mammogram every year.  Breast cancer gene (BRCA) assessment is recommended for women who have family members with BRCA-related cancers. BRCA-related cancers include:  Breast.  Ovarian.  Tubal.  Peritoneal cancers.  Results of the assessment will determine the need for genetic counseling and BRCA1 and BRCA2 testing. Cervical Cancer Your health care provider may recommend that you be screened regularly for cancer of the pelvic organs (ovaries, uterus, and  vagina). This screening involves a pelvic examination, including checking for microscopic changes to the surface of your cervix (Pap test). You may be encouraged to have this screening done every 3 years, beginning at age 21.  For women ages 30-65, health care providers may recommend pelvic exams and Pap testing every 3 years, or they may recommend the Pap and pelvic exam, combined with testing for human papilloma virus (HPV), every 5 years. Some types of HPV increase your risk of cervical cancer. Testing for HPV may also be done on women of any age with unclear Pap test results.  Other health care providers may not recommend any screening for nonpregnant women who are considered low risk for pelvic cancer and who do not have symptoms. Ask your health care provider if a screening pelvic exam is right for you.  If you have had past treatment for cervical cancer or a condition that could lead to cancer, you need Pap tests and screening for cancer for at least 20 years after your treatment. If Pap tests have been discontinued, your risk factors (such as having a new sexual partner) need to be reassessed to determine if screening should resume. Some women have medical problems that increase the chance of getting cervical cancer. In these cases, your health care provider may recommend more frequent screening and Pap tests. Colorectal Cancer  This type of cancer can be detected and often prevented.  Routine colorectal cancer screening usually begins at 69 years of age and continues through 69 years of age.  Your health care provider may recommend screening at an earlier age if you have risk factors for colon cancer.  Your health care provider may also recommend using home test kits to check for hidden blood in the stool.  A small camera at the end of a tube can be used to examine your colon directly (sigmoidoscopy or colonoscopy). This is done to check for the earliest forms of colorectal  cancer.  Routine screening usually begins at age 50.  Direct examination of the colon should be repeated every 5-10 years through 69 years of age. However, you may need to be screened more often if early forms of precancerous polyps or small growths are found. Skin Cancer  Check your skin from head to toe regularly.  Tell your health care provider about any new moles or changes in moles, especially if there is a change in a mole's shape or color.  Also tell your health care provider if you have a mole that is larger than the size of a pencil eraser.  Always use sunscreen. Apply sunscreen liberally and repeatedly throughout the day.  Protect yourself by wearing long sleeves, pants, a wide-brimmed hat, and sunglasses whenever you are outside. HEART DISEASE, DIABETES, AND HIGH BLOOD PRESSURE   High blood pressure causes heart disease and increases the risk of stroke. High blood pressure is more likely to develop in:  People who have blood pressure in the high end   of the normal range (130-139/85-89 mm Hg).  People who are overweight or obese.  People who are African American.  If you are 38-23 years of age, have your blood pressure checked every 3-5 years. If you are 61 years of age or older, have your blood pressure checked every year. You should have your blood pressure measured twice--once when you are at a hospital or clinic, and once when you are not at a hospital or clinic. Record the average of the two measurements. To check your blood pressure when you are not at a hospital or clinic, you can use:  An automated blood pressure machine at a pharmacy.  A home blood pressure monitor.  If you are between 45 years and 39 years old, ask your health care provider if you should take aspirin to prevent strokes.  Have regular diabetes screenings. This involves taking a blood sample to check your fasting blood sugar level.  If you are at a normal weight and have a low risk for diabetes,  have this test once every three years after 68 years of age.  If you are overweight and have a high risk for diabetes, consider being tested at a younger age or more often. PREVENTING INFECTION  Hepatitis B  If you have a higher risk for hepatitis B, you should be screened for this virus. You are considered at high risk for hepatitis B if:  You were born in a country where hepatitis B is common. Ask your health care provider which countries are considered high risk.  Your parents were born in a high-risk country, and you have not been immunized against hepatitis B (hepatitis B vaccine).  You have HIV or AIDS.  You use needles to inject street drugs.  You live with someone who has hepatitis B.  You have had sex with someone who has hepatitis B.  You get hemodialysis treatment.  You take certain medicines for conditions, including cancer, organ transplantation, and autoimmune conditions. Hepatitis C  Blood testing is recommended for:  Everyone born from [birth year] through 1964.  Anyone with known risk factors for hepatitis C. Sexually transmitted infections (STIs)  You should be screened for sexually transmitted infections (STIs) including gonorrhea and chlamydia if:  You are sexually active and are younger than 69 years of age.  You are older than 69 years of age and your health care provider tells you that you are at risk for this type of infection.  Your sexual activity has changed since you were last screened and you are at an increased risk for chlamydia or gonorrhea. Ask your health care provider if you are at risk.  If you do not have HIV, but are at risk, it may be recommended that you take a prescription medicine daily to prevent HIV infection. This is called pre-exposure prophylaxis (PrEP). You are considered at risk if:  You are sexually active and do not regularly use condoms or know the HIV status of your partner(s).  You take drugs by injection.  You are sexually  active with a partner who has HIV. Talk with your health care provider about whether you are at high risk of being infected with HIV. If you choose to begin PrEP, you should first be tested for HIV. You should then be tested every 3 months for as long as you are taking PrEP.  PREGNANCY   If you are premenopausal and you may become pregnant, ask your health care provider about preconception counseling.  If you may  become pregnant, take 400 to 800 micrograms (mcg) of folic acid every day.  If you want to prevent pregnancy, talk to your health care provider about birth control (contraception). OSTEOPOROSIS AND MENOPAUSE   Osteoporosis is a disease in which the bones lose minerals and strength with aging. This can result in serious bone fractures. Your risk for osteoporosis can be identified using a bone density scan.  If you are 61 years of age or older, or if you are at risk for osteoporosis and fractures, ask your health care provider if you should be screened.  Ask your health care provider whether you should take a calcium or vitamin D supplement to lower your risk for osteoporosis.  Menopause may have certain physical symptoms and risks.  Hormone replacement therapy may reduce some of these symptoms and risks. Talk to your health care provider about whether hormone replacement therapy is right for you.  HOME CARE INSTRUCTIONS   Schedule regular health, dental, and eye exams.  Stay current with your immunizations.   Do not use any tobacco products including cigarettes, chewing tobacco, or electronic cigarettes.  If you are pregnant, do not drink alcohol.  If you are breastfeeding, limit how much and how often you drink alcohol.  Limit alcohol intake to no more than 1 drink per day for nonpregnant women. One drink equals 12 ounces of beer, 5 ounces of wine, or 1 ounces of hard liquor.  Do not use street drugs.  Do not share needles.  Ask your health care provider for help if  you need support or information about quitting drugs.  Tell your health care provider if you often feel depressed.  Tell your health care provider if you have ever been abused or do not feel safe at home.   This information is not intended to replace advice given to you by your health care provider. Make sure you discuss any questions you have with your health care provider.   Document Released: 02/26/2011 Document Revised: 09/03/2014 Document Reviewed: 07/15/2013 Elsevier Interactive Patient Education [employer].

## 2016-01-27 NOTE — Assessment & Plan Note (Signed)
General medical exam normal today including breast exam. PAP and pelvic deferred, as PAP normal HPV neg in 2014, plan repeat in 2019. Mammogram scheduled. Colonoscopy UTD. Labs today. Immunizations UTD. Encouraged healthy diet and exercise.

## 2016-01-27 NOTE — Progress Notes (Signed)
Subjective:    Patient ID: Betty [name], female    DOB: Jan 03, 1946, 70 y.o.   MRN: [id number]  HPI  70YO female presents for physical exam.  DM - Does not check BG. Following healthy diet. Exercising at heart track 1-2 days per week.  Generally feeling well. Mammogram is scheduled.  Wt Readings from Last 3 Encounters:  01/27/16 183 lb 3.2 oz (83.099 kg)  07/26/15 182 lb 12 oz (82.895 kg)  04/21/15 196 lb (88.905 kg)   BP Readings from Last 3 Encounters:  01/27/16 104/62  07/26/15 108/66  04/21/15 108/69    Past Medical History  Diagnosis Date  . Hyperlipidemia   . Gout   . NICM (nonischemic cardiomyopathy) ([place]) 2002    EF 25%; improved to normal - echo 4/08: EF 50-55%, mild MR, mild LAE, mild TR;    cath 3/03: normal cors, EF 40%  . Glucose intolerance (impaired glucose tolerance)   . History of chicken pox   . Allergic rhinitis     never tested, fall and spring  . Sleep apnea     cpap settings 4  . Hypertension   . Shortness of breath dyspnea   . CHF (congestive heart failure) (H[facility])     Diastolic heart failure  . Diabetes mellitus without complication ([place])   . Diverticulosis     problems with frequent gas, burping  . Arthritis     hands,knees, feet  . Cataracts, bilateral     not surgical yet  . PONV (postoperative nausea and vomiting)   . Cancer ([place])     skin cancers only   Family History  Problem Relation Age of Onset  . Lymphoma Mother   . Cancer Mother 17    Lymphoma  . COPD Father     was a smoker  . Stroke Father   . Pneumonia Father   . Diabetes Father   . Hyperlipidemia Father   . Heart disease Father   . Diabetes Sister   . Depression Sister   . Meniere's disease Brother   . Diabetes Paternal Grandfather   . Heart disease Paternal Grandfather   . Schizophrenia Daughter   . Cancer Maternal Grandmother     Bladder  . Cancer Maternal Grandfather     leukemia  . Colon cancer Neg Hx   . Stomach cancer Neg Hx   . Heart attack  Father   . Hypertension Brother   . Hypertension Father    Past Surgical History  Procedure Laterality Date  . Nsvd      x 2  . Cystectomy  1996    l breast  . Choleystectomy  02/2002  . Vaginal delivery      x2  . Tubal ligation  1975  . Dilation and curettage of uterus  2011    [facility] Hulan [facility]  . Induced abortion    . Cardiac catheterization    . Bilateral vats ablation Right     biopsy done 2015- [facility]  . Colonoscopy with propofol N/A 12/20/2014    Procedure: COLONOSCOPY WITH PROPOFOL;  Surgeon: [name], MD;  Location: [facility];  Service: Gastroenterology;  Laterality: N/A;  . Breast biopsy Left 80's    Cyst removed   Social History   Social History  . Marital Status: Married    Spouse Name: N/A  . Number of Children: 2  . Years of Education: N/A   Occupational History  . Retired Pharmacist, hospital   . Works  at [employer] History Main Topics  . Smoking status: Never Smoker   . Smokeless tobacco: Never Used  . Alcohol Use: No  . Drug Use: No  . Sexual Activity: No   Other Topics Concern  . None   Social History Narrative   Lives in [place] with husband. 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 No pets.    Review of Systems  Constitutional: Negative for fever, chills, appetite change, fatigue and unexpected weight change.  Eyes: Negative for visual disturbance.  Respiratory: Negative for cough, chest tightness and shortness of breath.   Cardiovascular: Negative for chest pain and leg swelling.  Gastrointestinal: Negative for nausea, vomiting, abdominal pain, diarrhea and constipation.  Genitourinary: Negative for dysuria, urgency, frequency, flank pain, difficulty urinating, genital sores, pelvic pain and dyspareunia.  Musculoskeletal: Negative for myalgias and arthralgias.  Skin: Negative for color change and rash.  Hematological: Negative for adenopathy. Does not bruise/bleed easily.  Psychiatric/Behavioral: Negative  for suicidal ideas, sleep disturbance and dysphoric mood. The patient is not nervous/anxious.        Objective:    BP 104/62 mmHg  Pulse 78  Ht 5\' 5"  (1.651 m)  Wt 183 lb 3.2 oz (83.099 kg)  BMI 30.49 kg/m2  SpO2 95% Physical Exam  Constitutional: She is oriented to person, place, and time. She appears well-developed and well-nourished. No distress.  HENT:  Head: Normocephalic and atraumatic.  Right Ear: External ear normal.  Left Ear: External ear normal.  Nose: Nose normal.  Mouth/Throat: Oropharynx is clear and moist. No oropharyngeal exudate.  Eyes: Conjunctivae are normal. Pupils are equal, round, and reactive to light. Right eye exhibits no discharge. Left eye exhibits no discharge. No scleral icterus.  Neck: Normal range of motion. Neck supple. No tracheal deviation present. No thyromegaly present.  Cardiovascular: Normal rate, regular rhythm, normal heart sounds and intact distal pulses.  Exam reveals no gallop and no friction rub.   No murmur heard. Pulmonary/Chest: Effort normal and breath sounds normal. No accessory muscle usage. No tachypnea. No respiratory distress. She has no decreased breath sounds. She has no wheezes. She has no rales. She exhibits no tenderness. Right breast exhibits no inverted nipple, no mass, no nipple discharge, no skin change and no tenderness. Left breast exhibits no inverted nipple, no mass, no nipple discharge, no skin change and no tenderness. Breasts are symmetrical.  Abdominal: Soft. Bowel sounds are normal. She exhibits no distension and no mass. There is no tenderness. There is no rebound and no guarding.  Musculoskeletal: Normal range of motion. She exhibits no edema or tenderness.  Lymphadenopathy:    She has no cervical adenopathy.  Neurological: She is alert and oriented to person, place, and time. No cranial nerve deficit. She exhibits normal muscle tone. Coordination normal.  Skin: Skin is warm and dry. No rash noted. She is not  diaphoretic. No erythema. No pallor.  Psychiatric: She has a normal mood and affect. Her behavior is normal. Judgment and thought content normal.          Assessment & Plan:   Problem List Items Addressed This Visit      Unprioritized   Diabetes mellitus type 2, controlled ([place])   Relevant Orders   Comprehensive metabolic panel   Hemoglobin A1c   Microalbumin / creatinine urine ratio   Routine general medical examination at a health care facility - Primary    General medical exam normal today including breast exam. PAP and pelvic  deferred, as PAP normal HPV neg in 2014, plan repeat in 2019. Mammogram scheduled. Colonoscopy UTD. Labs today. Immunizations UTD. Encouraged healthy diet and exercise.      Relevant Orders   Lipid panel   CBC with Differential/Platelet       Return in about 6 months (around 07/28/2016) for Recheck.  Ronette Deter, MD Internal Medicine [facility]

## 2016-02-01 ENCOUNTER — Ambulatory Visit
Admission: RE | Admit: 2016-02-01 | Discharge: 2016-02-01 | Disposition: A | Discharge status: Home / Self Care | Payer: Medicare Other | Source: Ambulatory Visit | Attending: Internal Medicine | Admitting: Internal Medicine

## 2016-02-01 ENCOUNTER — Other Ambulatory Visit: Payer: Self-pay | Admitting: Internal Medicine

## 2016-02-01 DIAGNOSIS — Z1231 Encounter for screening mammogram for malignant neoplasm of breast: Secondary | ICD-10-CM | POA: Diagnosis present

## 2016-03-16 ENCOUNTER — Telehealth: Payer: Self-pay | Admitting: *Deleted

## 2016-03-16 NOTE — Telephone Encounter (Signed)
Spoke to patient.  Explained Dr. Derrel Nip nor Dr. Nicki Reaper are not accepting new patients at this time. Advised NP available as bridge. Patient stated she will have to think and call back.

## 2016-03-16 NOTE — Telephone Encounter (Signed)
Returned call. No answer.  

## 2016-03-16 NOTE — Telephone Encounter (Signed)
Pt has requested to have herself and her husband(Kyle) continue care in the office, by Dr. Derrel Nip or Dr. Nicki Reaper. They are both previous pt of Dr. Gilford Rile  Pt contact [phone]

## 2016-03-28 ENCOUNTER — Ambulatory Visit (INDEPENDENT_AMBULATORY_CARE_PROVIDER_SITE_OTHER): Payer: Medicare Other

## 2016-03-28 VITALS — BP 106/60 | HR 78 | Temp 97.8°F | Resp 12 | Ht 65.0 in | Wt 189.4 lb

## 2016-03-28 DIAGNOSIS — Z0000 Encounter for general adult medical examination without abnormal findings: Secondary | ICD-10-CM | POA: Diagnosis not present

## 2016-03-28 NOTE — Progress Notes (Signed)
Annual Wellness Visit as completed by Health Coach was reviewed in full.  

## 2016-03-28 NOTE — Progress Notes (Signed)
Subjective:   Betty [name] is a 70 y.o. female who presents for Medicare Annual (Subsequent) preventive examination.  Review of Systems:  No ROS.  Medicare Wellness Visit.  Cardiac Risk Factors include: advanced age (>50men, >29 women);hypertension;diabetes mellitus     Objective:     Vitals: BP 106/60 (BP Location: Right Arm, Patient Position: Sitting, Cuff Size: Normal)   Pulse 78   Temp 97.8 F (36.6 C) (Oral)   Resp 12   Ht 5\' 5"  (1.651 m)   Wt 189 lb 6.4 oz (85.9 kg)   SpO2 95%   BMI 31.52 kg/m   Body mass index is 31.52 kg/m.   Tobacco History  Smoking Status  . Never Smoker  Smokeless Tobacco  . Never Used     Counseling given: Not Answered   Past Medical History:  Diagnosis Date  . Allergic rhinitis    never tested, fall and spring  . Arthritis    hands,knees, feet  . Cancer ([place])    skin cancers only  . Cataracts, bilateral    not surgical yet  . CHF (congestive heart failure) (HCC)    Diastolic heart failure  . Diabetes mellitus without complication ([place])   . Diverticulosis    problems with frequent gas, burping  . Glucose intolerance (impaired glucose tolerance)   . Gout   . History of chicken pox   . Hyperlipidemia   . Hypertension   . NICM (nonischemic cardiomyopathy) ([place]) 2002   EF 25%; improved to normal - echo 4/08: EF 50-55%, mild MR, mild LAE, mild TR;    cath 3/03: normal cors, EF 40%  . PONV (postoperative nausea and vomiting)   . Shortness of breath dyspnea   . Sleep apnea    cpap settings 4   Past Surgical History:  Procedure Laterality Date  . BILATERAL VATS ABLATION Right    biopsy done 2015- [facility]  . BREAST BIOPSY Left 80's   Cyst removed  . CARDIAC CATHETERIZATION    . choleystectomy  02/2002  . COLONOSCOPY WITH PROPOFOL N/A 12/20/2014   Procedure: COLONOSCOPY WITH PROPOFOL;  Surgeon: [name], MD;  Location: [facility];  Service: Gastroenterology;  Laterality: N/A;  . CYSTECTOMY  1996   l breast  .  DILATION AND CURETTAGE OF UTERUS  2011   Dr. Hulan Fray at [facility]  . [place]    . nsvd     x 2  . TUBAL LIGATION  1975  . VAGINAL DELIVERY     x2   Family History  Problem Relation Age of Onset  . Lymphoma Mother   . Cancer Mother 63    Lymphoma  . COPD Father     was a smoker  . Stroke Father   . Pneumonia Father   . Diabetes Father   . Hyperlipidemia Father   . Heart disease Father   . Heart attack Father   . Hypertension Father   . Diabetes Sister   . Depression Sister   . Meniere's disease Brother   . Diabetes Paternal Grandfather   . Heart disease Paternal Grandfather   . Schizophrenia Daughter   . Cancer Maternal Grandmother     Bladder  . Cancer Maternal Grandfather     leukemia  . Hypertension Brother   . Colon cancer Neg Hx   . Stomach cancer Neg Hx    History  Sexual Activity  . Sexual activity: No    Outpatient Encounter Prescriptions as of 03/28/2016  Medication  Sig  . aspirin 81 MG EC tablet Take 81 mg by mouth daily.   . Calcium Citrate-Vitamin D 200-125 MG-UNIT TABS Take 250 Units by mouth 2 (two) times daily.   . carvedilol (COREG) 12.5 MG tablet Take 1 tablet (12.5 mg total) by mouth 2 (two) times daily with a meal.  . Cholecalciferol (VITAMIN D-3) 1000 UNITS CAPS Take 1,000 Units by mouth 2 (two) times daily. GUMMIES  . Coenzyme Q10 (CO Q 10) 100 MG CAPS Take 100 mg by mouth at bedtime.   . Fish Oil OIL Take 227 mcg by mouth 2 (two) times daily. GUMMIES  . furosemide (LASIX) 40 MG tablet Take 1 tablet (40 mg total) by mouth daily. Take extra tablet once daily as needed for weight gain or swelling.  Marland Kitchen losartan (COZAAR) 50 MG tablet TAKE 1/2 TABLET BY MOUTH EVERY DAY  . mometasone (NASONEX) 50 MCG/ACT nasal spray Place 2 sprays into the nose daily.  . multivitamin (THERAGRAN) per tablet Take 1 tablet by mouth daily.   . polyvinyl alcohol (LIQUIFILM TEARS) 1.4 % ophthalmic solution Place 1 drop into both eyes every morning.  . potassium  chloride SA (KLOR-CON M20) 20 MEQ tablet TAKE 2 TABLETS EVERY MORNING AND 1 TABLET EACH EVENING  . simvastatin (ZOCOR) 20 MG tablet TAKE 1 TABLET BY MOUTH AT BEDTIME.   No facility-administered encounter medications on file as of 03/28/2016.     Activities of Daily Living In your present state of health, do you have any difficulty performing the following activities: 03/28/2016  Hearing? N  Vision? N  Difficulty concentrating or making decisions? N  Walking or climbing stairs? N  Dressing or bathing? N  Doing errands, shopping? N  Preparing Food and eating ? N  Using the Toilet? N  In the past six months, have you accidently leaked urine? N  Do you have problems with loss of bowel control? N  Managing your Medications? N  Managing your Finances? N  Housekeeping or managing your Housekeeping? N  Some recent data might be hidden    Patient Care Team: Leone Haven, MD as PCP - General (Family Medicine) Kennieth Francois, MD (Dermatology) Juanito Doom, MD as Referring Physician (Internal Medicine)    Assessment:    This is a routine wellness examination for Betty [name]. The goal of the wellness visit is to assist the patient how to close the gaps in care and create a preventative care plan for the patient.   Taking calcium VIT D3 as appropriate/Osteoporosis risk reviewed.  Medications reviewed; taking without issues or barriers.  Safety issues reviewed; smoke detectors in the home. No firearms in the home. Wears seatbelts when driving or riding with others. No violence in the home.  There is no risks for hepatitis, STDs or HIV. There is no history of blood transfusion. They have no travel history to infectious disease endemic areas of the world.  No identified risk were noted; The patient was oriented x 3; appropriate in dress and manner and no objective failures at ADL's or IADL's.   Body mass index; discussed the importance of a healthy diet, water intake and  exercise. Educational material provided.  The patient has seen their dentist in the last six month. (Dr. Metro Kung)  Diabetic eye exam scheduled 04/2016.  No previous report of retinopathy.  Chronic disatolic hrt fail-sable and followed by Dr. Haroldine Laws Chronic respiratory fail-stable and followed by Dr. Hinda Lenis cardiomyopathy NEC-stable and followed by Dr. Haroldine Laws  Patient Concerns: None  at this time. Follow up with PCP as needed.  Exercise Activities and Dietary recommendations Current Exercise Habits: Structured exercise class, Type of exercise: calisthenics;strength training/weights;treadmill, Time (Minutes): 60, Frequency (Times/Week): 2, Weekly Exercise (Minutes/Week): 120, Intensity: Mild  Goals    . Healthy Lifestyle          STAY HYDRATED AND DRINK FLUIDS/WATER WITHIN THE SUGGESTED RESTRICTION, AS DIRECTED. LOW CARB FOODS.  LEAN MEATS (CHICKEN, [place], FISH), VEGETABLES, FRUITS. STAY ACTIVE AND MAINTAIN EXERCISE ROUTINE.       Fall Risk Fall Risk  03/28/2016 01/27/2016 01/26/2015 07/19/2014 08/18/2012  Falls in the past year? No No No No No   Depression Screen PHQ 2/9 Scores 03/28/2016 01/27/2016 01/26/2015 07/19/2014  PHQ - 2 Score 0 0 0 0     Cognitive Testing MMSE - Mini Mental State Exam 03/28/2016  Orientation to time 5  Orientation to Place 5  Registration 3  Attention/ Calculation 5  Recall 3  Language- name 2 objects 2  Language- repeat 1  Language- follow 3 step command 3  Language- read & follow direction 1  Write a sentence 1  Copy design 1  Total score 30    Immunization History  Administered Date(s) Administered  . H1N1 09/08/2008  . Influenza Split 06/28/2011, 05/01/2012  . Influenza Whole 07/11/2007, 06/07/2008, 07/12/2009  . Influenza,inj,Quad PF,36+ Mos 04/23/2014, 04/21/2015  . Influenza-Unspecified 04/26/2013  . Pneumococcal Conjugate-13 01/21/2014  . Pneumococcal Polysaccharide-23 03/06/2012  . Td 10/03/2007, 10/14/2013  . Zoster  06/21/2010   Screening Tests Health Maintenance  Topic Date Due  . OPHTHALMOLOGY EXAM  01/27/2016  . INFLUENZA VACCINE  03/27/2016  . HEMOGLOBIN A1C  07/28/2016  . FOOT EXAM  01/26/2017  . COLONOSCOPY  12/19/2017  . MAMMOGRAM  01/31/2018  . TETANUS/TDAP  10/15/2023  . DEXA SCAN  Completed  . ZOSTAVAX  Completed  . Hepatitis C Screening  Completed  . PNA vac Low Risk Adult  Completed      Plan:   End of life planning; Advance aging; Advanced directives discussed. Educational material provided to help start the conversation with her family.  HCPOA/Living Will short forms requested upon completion.  Time spent addressing this topic is 18 minutes.  During the course of the visit the patient was educated and counseled about the following appropriate screening and preventive services:   Vaccines to include Pneumoccal, Influenza, Hepatitis B, Td, Zostavax, HCV  Electrocardiogram  Cardiovascular Disease  Colorectal cancer screening  Bone density screening  Diabetes screening  Glaucoma screening  Mammography/PAP  Nutrition counseling   Patient Instructions (the written plan) was given to the patient.   Varney Biles, LPN  [phone]

## 2016-03-28 NOTE — Patient Instructions (Addendum)
  Betty [name] , Thank you for taking time to come for your Medicare Wellness Visit. I appreciate your ongoing commitment to your health goals. Please review the following plan we discussed and let me know if I can assist you in the future.    RESCHEDULE December FOLLOW UP WITH DR. SONNENBERG   These are the goals we discussed: Goals    . Healthy Lifestyle          STAY HYDRATED AND DRINK FLUIDS/WATER WITHIN THE SUGGESTED RESTRICTION, AS DIRECTED. LOW CARB FOODS.  LEAN MEATS (CHICKEN, [place], FISH), VEGETABLES, FRUITS. STAY ACTIVE AND MAINTAIN EXERCISE ROUTINE.        This is a list of the screening recommended for you and due dates:  Health Maintenance  Topic Date Due  . Eye exam for diabetics  01/27/2016  . Flu Shot  03/27/2016  . Hemoglobin A1C  07/28/2016  . Complete foot exam   01/26/2017  . Colon Cancer Screening  12/19/2017  . Mammogram  01/31/2018  . Tetanus Vaccine  10/15/2023  . DEXA scan (bone density measurement)  Completed  . Shingles Vaccine  Completed  .  Hepatitis C: One time screening is recommended by Center for Disease Control  (CDC) for  adults born from [birth year] through 1964.   Completed  . Pneumonia vaccines  Completed

## 2016-04-09 ENCOUNTER — Other Ambulatory Visit (HOSPITAL_COMMUNITY): Payer: Self-pay | Admitting: Internal Medicine

## 2016-04-16 ENCOUNTER — Other Ambulatory Visit: Payer: Self-pay

## 2016-04-16 MED ORDER — LOSARTAN POTASSIUM 50 MG PO TABS: 25.0000 mg | ORAL_TABLET | Freq: Every day | ORAL | 0 refills | Status: DC

## 2016-04-16 NOTE — Telephone Encounter (Signed)
Medication refill

## 2016-04-24 ENCOUNTER — Other Ambulatory Visit: Payer: Self-pay | Admitting: Cardiology

## 2016-05-01 ENCOUNTER — Encounter: Payer: Self-pay | Admitting: Family Medicine

## 2016-05-01 ENCOUNTER — Ambulatory Visit (INDEPENDENT_AMBULATORY_CARE_PROVIDER_SITE_OTHER): Payer: Medicare Other | Admitting: Family Medicine

## 2016-05-01 ENCOUNTER — Ambulatory Visit (INDEPENDENT_AMBULATORY_CARE_PROVIDER_SITE_OTHER): Payer: Medicare Other

## 2016-05-01 VITALS — BP 128/64 | HR 97 | Temp 97.7°F | Wt 194.2 lb

## 2016-05-01 DIAGNOSIS — H6121 Impacted cerumen, right ear: Secondary | ICD-10-CM | POA: Diagnosis not present

## 2016-05-01 DIAGNOSIS — M5442 Lumbago with sciatica, left side: Secondary | ICD-10-CM | POA: Diagnosis not present

## 2016-05-01 MED ORDER — GABAPENTIN 100 MG PO CAPS
100.0000 mg | ORAL_CAPSULE | Freq: Three times a day (TID) | ORAL | 3 refills | Status: DC
Start: 2016-05-01 — End: 2016-08-03

## 2016-05-01 NOTE — Progress Notes (Signed)
Pre visit review using our clinic review tool, if applicable. No additional management support is needed unless otherwise documented below in the visit note. 

## 2016-05-01 NOTE — Progress Notes (Signed)
Tommi Rumps, MD Phone: [phone]  Betty [name] is a 70 y.o. female who presents today for same-day visit.  Low back pain: Patient notes about 4 weeks ago she woke up with left-sided low back discomfort. Notes it radiates down the back of her left leg. No injury. Has had similar pain in the past when she was on Accutane for her rosacea. She notes chronic numbness in her left lateral thigh though no new numbness. No weakness. No saddle anesthesia, loss of bowel or bladder function, or fevers. Does note a history of skin cancer. Has been taking Aleve with little benefit. Notes pain is worse with standing or lifting her leg.  Additionally notes a nurse from her insurance company came to her house and told her that she had earwax in her right ear. Wants me to look at this.  PMH: nonsmoker.   ROS see history of present illness  Objective  Physical Exam Vitals:   05/01/16 0835  BP: 128/64  Pulse: 97  Temp: 97.7 F (36.5 C)    BP Readings from Last 3 Encounters:  05/01/16 128/64  03/28/16 106/60  01/27/16 104/62   Wt Readings from Last 3 Encounters:  05/01/16 194 lb 3.2 oz (88.1 kg)  03/28/16 189 lb 6.4 oz (85.9 kg)  01/27/16 183 lb 3.2 oz (83.1 kg)    Physical Exam  Constitutional: No distress.  HENT:  Head: Normocephalic and atraumatic.  Minimal cerumen in right ear, normal TM, left ear with no serum and, normal TM  Cardiovascular: Normal rate, regular rhythm and normal heart sounds.   Pulmonary/Chest: Effort normal and breath sounds normal.  Musculoskeletal:  No midline spine tenderness, no midline spine step-off, no muscular back tenderness  Neurological: She is alert. Gait normal.  5/5 strength in bilateral quads, hamstrings, plantar flexion, and dorsiflexion, decreased sensation left lateral thigh otherwise sensation to light touch intact in bilateral lower extremities, absent patellar reflexes bilaterally  Skin: Skin is warm and dry. She is not diaphoretic.       Assessment/Plan: Please see individual problem list.  Left-sided low back pain with left-sided sciatica Patient with subacute left-sided low back pain with left-sided sciatica. Not improving over the last 4 weeks. Benign exam. Neurologically intact. No red flags. We will obtain an x-ray of her low back. If abnormalities would consider MRI. We'll start her on gabapentin to see if this would be beneficial. Advised on titrating up every 2-3 days by 100 mg at a time. Advised if this makes her drowsy to stop titrating up and let us know. Could also consider physical therapy if not improving. Given return precautions.  Excessive cerumen in right ear canal Patient with small amount of cerumen in right ear canal, thin-appearing and able to visualize the TM around it. Advised that there is not enough to irrigate. Suggested debrox if bothersome.   Orders Placed This Encounter  Procedures  . DG Lumbar Spine Complete    Standing Status:   Future    Number of Occurrences:   1    Standing Expiration Date:   07/01/2017    Order Specific Question:   Reason for Exam (SYMPTOM  OR DIAGNOSIS REQUIRED)    Answer:   low back pain radiating down left leg posteriorly    Order Specific Question:   Preferred imaging location?    Answer:   [facility] ordered this encounter  Medications  . gabapentin (NEURONTIN) 100 MG capsule    Sig: Take 1  capsule (100 mg total) by mouth 3 (three) times daily.    Dispense:  180 capsule    Refill:  [place], MD [place]

## 2016-05-01 NOTE — Assessment & Plan Note (Addendum)
Patient with subacute left-sided low back pain with left-sided sciatica. Not improving over the last 4 weeks. Benign exam. Neurologically intact. No red flags. We will obtain an x-ray of her low back. If abnormalities would consider MRI. We'll start her on gabapentin to see if this would be beneficial. Advised on titrating up every 2-3 days by 100 mg at a time. Advised if this makes her drowsy to stop titrating up and let us know. Could also consider physical therapy if not improving. Given return precautions.

## 2016-05-01 NOTE — Assessment & Plan Note (Signed)
Patient with small amount of cerumen in right ear canal, thin-appearing and able to visualize the TM around it. Advised that there is not enough to irrigate. Suggested debrox if bothersome.

## 2016-05-01 NOTE — Patient Instructions (Signed)
As to meet you. We are going to obtain an x-ray of her low back to evaluate for cause of your pain. You can take the Aleve as you have been. We will start you on gabapentin 100 mg 3 times daily. You can increase by 100 mg 3 times daily every 2-3 days until her pain is under good control. If you develop drowsiness you should back down to the prior dose. If you develop numbness, weakness, loss of bowel or bladder function, numbness between her legs, fevers, or any new or change in symptoms please seek medical attention.

## 2016-05-02 ENCOUNTER — Telehealth: Payer: Self-pay | Admitting: Family Medicine

## 2016-05-02 DIAGNOSIS — M5442 Lumbago with sciatica, left side: Secondary | ICD-10-CM

## 2016-05-02 NOTE — Telephone Encounter (Signed)
Can you pleas place order. This is documented in result notes.

## 2016-05-02 NOTE — Telephone Encounter (Signed)
Pt would like to get a MRI. Pt states she received a call. Pt needs order and referral please and thank you!  Call pt @ [phone].

## 2016-05-02 NOTE — Telephone Encounter (Signed)
Please advise 

## 2016-05-02 NOTE — Telephone Encounter (Signed)
Orders placed.

## 2016-05-04 LAB — HM DIABETES EYE EXAM

## 2016-05-08 ENCOUNTER — Other Ambulatory Visit: Payer: Self-pay | Admitting: Family Medicine

## 2016-05-08 DIAGNOSIS — M5442 Lumbago with sciatica, left side: Secondary | ICD-10-CM

## 2016-05-09 ENCOUNTER — Encounter: Payer: Self-pay | Admitting: Surgical

## 2016-05-14 ENCOUNTER — Other Ambulatory Visit (HOSPITAL_COMMUNITY): Payer: Self-pay | Admitting: *Deleted

## 2016-05-14 ENCOUNTER — Ambulatory Visit
Admission: RE | Admit: 2016-05-14 | Discharge: 2016-05-14 | Disposition: A | Discharge status: Home / Self Care | Payer: Medicare Other | Source: Ambulatory Visit | Attending: Family Medicine | Admitting: Family Medicine

## 2016-05-14 DIAGNOSIS — M5442 Lumbago with sciatica, left side: Secondary | ICD-10-CM | POA: Diagnosis present

## 2016-05-14 DIAGNOSIS — M5116 Intervertebral disc disorders with radiculopathy, lumbar region: Secondary | ICD-10-CM | POA: Insufficient documentation

## 2016-05-14 DIAGNOSIS — M4807 Spinal stenosis, lumbosacral region: Secondary | ICD-10-CM | POA: Insufficient documentation

## 2016-05-14 MED ORDER — FUROSEMIDE 40 MG PO TABS: ORAL_TABLET | ORAL | 3 refills | Status: DC

## 2016-05-17 ENCOUNTER — Telehealth: Payer: Self-pay | Admitting: Family Medicine

## 2016-05-17 NOTE — Telephone Encounter (Signed)
Results given by Shaune Pascal

## 2016-05-17 NOTE — Telephone Encounter (Signed)
Pt had flu shots on 05/04/16 @ [facility]. Thank you!

## 2016-05-17 NOTE — Telephone Encounter (Signed)
Pt called to get her imaging results from 04/27/16. Thank you!  Call pt @ [phone]

## 2016-05-28 ENCOUNTER — Encounter: Payer: Self-pay | Admitting: Physical Therapy

## 2016-05-28 ENCOUNTER — Ambulatory Visit: Payer: Medicare Other | Attending: Family Medicine | Admitting: Physical Therapy

## 2016-05-28 DIAGNOSIS — M5442 Lumbago with sciatica, left side: Secondary | ICD-10-CM | POA: Diagnosis present

## 2016-05-28 DIAGNOSIS — G8929 Other chronic pain: Secondary | ICD-10-CM | POA: Insufficient documentation

## 2016-05-28 DIAGNOSIS — M6281 Muscle weakness (generalized): Secondary | ICD-10-CM | POA: Insufficient documentation

## 2016-05-28 DIAGNOSIS — R262 Difficulty in walking, not elsewhere classified: Secondary | ICD-10-CM | POA: Insufficient documentation

## 2016-05-31 ENCOUNTER — Ambulatory Visit: Payer: Medicare Other | Admitting: Physical Therapy

## 2016-05-31 DIAGNOSIS — M5442 Lumbago with sciatica, left side: Secondary | ICD-10-CM | POA: Diagnosis not present

## 2016-05-31 DIAGNOSIS — M6281 Muscle weakness (generalized): Secondary | ICD-10-CM

## 2016-05-31 DIAGNOSIS — G8929 Other chronic pain: Secondary | ICD-10-CM

## 2016-06-01 NOTE — Therapy (Signed)
[facility] [address] [place], [address] Phone: [phone]   Fax:  [phone]  Physical Therapy Treatment  Patient Details  Name: Betty [name] MRN: [id number] Date of Birth: Mar 08, 1946 Referring Provider: Caryl Bis  Encounter Date: 05/31/2016      PT End of Session - 06/01/16 0621    Visit Number 1   Number of Visits 9   Date for PT Re-Evaluation 06/29/16   Authorization Type g code   PT Start Time 1030   PT Stop Time 1115   PT Time Calculation (min) 45 min   Activity Tolerance Patient tolerated treatment well      Past Medical History:  Diagnosis Date  . Allergic rhinitis    never tested, fall and spring  . Arthritis    hands,knees, feet  . Cancer ([place])    skin cancers only  . Cataracts, bilateral    not surgical yet  . CHF (congestive heart failure) (HCC)    Diastolic heart failure  . Diabetes mellitus without complication ([place])   . Diverticulosis    problems with frequent gas, burping  . Glucose intolerance (impaired glucose tolerance)   . Gout   . History of chicken pox   . Hyperlipidemia   . Hypertension   . NICM (nonischemic cardiomyopathy) ([place]) 2002   EF 25%; improved to normal - echo 4/08: EF 50-55%, mild MR, mild LAE, mild TR;    cath 3/03: normal cors, EF 40%  . PONV (postoperative nausea and vomiting)   . Shortness of breath dyspnea   . Sleep apnea    cpap settings 4    Past Surgical History:  Procedure Laterality Date  . BILATERAL VATS ABLATION Right    biopsy done 2015- [facility]  . BREAST BIOPSY Left 80's   Cyst removed  . CARDIAC CATHETERIZATION    . choleystectomy  02/2002  . COLONOSCOPY WITH PROPOFOL N/A 12/20/2014   Procedure: COLONOSCOPY WITH PROPOFOL;  Surgeon: [name], MD;  Location: [facility];  Service: Gastroenterology;  Laterality: N/A;  . CYSTECTOMY  1996   l breast  . DILATION AND CURETTAGE OF UTERUS  2011   Dr. Hulan Fray at [facility]  .  [place]    . nsvd     x 2  . TUBAL LIGATION  1975  . VAGINAL DELIVERY     x2    There were no vitals filed for this visit.      Subjective Assessment - 05/31/16 0603    Subjective Pt reports no change in symptoms, she has been performing her HEP   Pertinent History pain with rolling in bed, laying in bed   How long can you sit comfortably? no problems   How long can you stand comfortably? immediate pain   How long can you walk comfortably? immediate pain   Diagnostic tests MRI - L4-L5 disc herniation   Patient Stated Goals decr. pain, return to exercise         Objective: Elevated head of bed manual traction through hip, performed 3x1 min holds. Pt reported no change with this in pain with extension.  Seated STM on B hypertonic paraspinals. Following this pt had additional 15 deg. Standing lumbar extension prior to onset of pain.  Seated, elbows on knees deep breathing with tapping to facilitate expanding and flexing lumbar spine, 3x5 breaths.  Seated ball rolling 3x10 also with a single deep breath at end range. Cuing to tuck head and  improve stretch in back.  Educated pt that these two exercises are to be performed any time she irritates her back.  Standing RTB scapular retractions to activate back in non-painful loading strategy. 3x10.  Nu-step L1 x5 min monitoring for pain and oxygen level, both appropriate throughout. Pt required cuing throughout session for posture, monitoring pain, educating on new exercises.                         PT Education - 06/01/16 0604    Education provided Yes   Education Details progression of HEP   Person(s) Educated Patient   Methods Explanation   Comprehension Verbalized understanding             PT Long Term Goals - 05/28/16 [id number]      PT LONG TERM GOAL #1   Title pt will be able to stand 20 min with no c/o pain to return to cooking   Baseline pain with 5 min standing   Time 4   Period  Weeks   Status New     PT LONG TERM GOAL #2   Title Pt will participate in 6MWT pain free to improve functional gait activity level   Baseline pain with 3 min walking   Time 4   Period Weeks   Status New     PT LONG TERM GOAL #3   Title Pt will be I with HEP to manage pain symptoms using flexion activity   Time 4   Period Weeks   Status New               Plan - 05/31/16 [id number]    Clinical Impression Statement Difficulty changing symptoms with pt with any activity other than flexion today. Pt performed flexion exercises well however and PT educated pt on the importance of avoiding irritating activities for now. One instance of O2 dropping to 90 when laying on back so performed distraction and manual therapy with bed elevated to put diaphragm in ideal respiratory position. Also performed nu-step with monitoring for oxygen level and pain to assess if pt is appropriate to return to her exercise routine, she performed well and stayed safe so encouraged pt to return to [facility] program.   Rehab Potential Fair   Clinical Impairments Affecting Rehab Potential medical comorbidities/motivation   PT Frequency 2x / week   PT Duration 4 weeks   PT Treatment/Interventions ADLs/Self Care Home Management;Aquatic Therapy;Manual techniques;Therapeutic exercise;Functional mobility training;Therapeutic activities;Patient/family education   Consulted and Agree with Plan of Care Patient      Patient will benefit from skilled therapeutic intervention in order to improve the following deficits and impairments:  Pain, Improper body mechanics, Difficulty walking, Hypomobility, Decreased strength, Decreased range of motion  Visit Diagnosis: Chronic midline low back pain with left-sided sciatica  Muscle weakness (generalized)     Problem List Patient Active Problem List   Diagnosis Date Noted  . Left-sided low back pain with left-sided sciatica 05/01/2016  . Excessive cerumen in right ear canal  05/01/2016  . Routine general medical examination at a health care facility 01/27/2016  . Need for hepatitis C screening test 04/21/2015  . History of colonic polyps   . Benign neoplasm of ascending colon   . Benign neoplasm of transverse colon   . Rash and nonspecific skin eruption 08/30/2014  . Postmenopausal estrogen deficiency 08/30/2014  . Left Achilles tendinitis 04/23/2014  . Chronic diastolic heart failure ([place]) 04/08/2014  . Obesity (BMI 30-39.9)  04/09/2013  . Diabetes mellitus type 2, controlled ([place]) 01/07/2013  . Edema 01/07/2013  . Medicare annual wellness visit, subsequent 11/28/2012  . Chronic hypoxemic respiratory failure ([place]) 11/11/2012  . Obstructive sleep apnea 11/11/2012  . Dyspnea 08/18/2012  . Fatigue 08/18/2012  . Meralgia paresthetica of left side 08/20/2011  . Myalgia 05/03/2011  . Leg pain, lateral 04/12/2011  . GOUT 12/26/2009  . CARDIOMYOPATHY 10/08/2007  . ALLERGIC RHINITIS, SEASONAL 10/08/2007  . CHOLECYSTECTOMY, HX OF 10/08/2007  . HYPERLIPIDEMIA, MIXED 05/02/2007    Fisher,Benjamin 06/01/2016, 6:35 AM  [facility] [address] [place], [address] Phone: [phone]   Fax:  [phone]  Name: Betty [name] MRN: [id number] Date of Birth: [birth year]

## 2016-06-01 NOTE — Therapy (Signed)
[facility] [address] [place], [address] Phone: [phone]   Fax:  [phone]  Physical Therapy Evaluation  Patient Details  Name: Betty [name] MRN: [id number] Date of Birth: [birth year] Referring Provider: Caryl Bis  Encounter Date: 05/28/2016      PT End of Session - 06/01/16 0621    Visit Number 1   Number of Visits 9   Date for PT Re-Evaluation 06/29/16   Authorization Type g code   PT Start Time 1030   PT Stop Time 1115   PT Time Calculation (min) 45 min   Activity Tolerance Patient tolerated treatment well      Past Medical History:  Diagnosis Date  . Allergic rhinitis    never tested, fall and spring  . Arthritis    hands,knees, feet  . Cancer ([place])    skin cancers only  . Cataracts, bilateral    not surgical yet  . CHF (congestive heart failure) (HCC)    Diastolic heart failure  . Diabetes mellitus without complication ([place])   . Diverticulosis    problems with frequent gas, burping  . Glucose intolerance (impaired glucose tolerance)   . Gout   . History of chicken pox   . Hyperlipidemia   . Hypertension   . NICM (nonischemic cardiomyopathy) ([place]) 2002   EF 25%; improved to normal - echo 4/08: EF 50-55%, mild MR, mild LAE, mild TR;    cath 3/03: normal cors, EF 40%  . PONV (postoperative nausea and vomiting)   . Shortness of breath dyspnea   . Sleep apnea    cpap settings 4    Past Surgical History:  Procedure Laterality Date  . BILATERAL VATS ABLATION Right    biopsy done 2015- [facility]  . BREAST BIOPSY Left 80's   Cyst removed  . CARDIAC CATHETERIZATION    . choleystectomy  02/2002  . COLONOSCOPY WITH PROPOFOL N/A 12/20/2014   Procedure: COLONOSCOPY WITH PROPOFOL;  Surgeon: [name], MD;  Location: [facility];  Service: Gastroenterology;  Laterality: N/A;  . CYSTECTOMY  1996   l breast  . DILATION AND CURETTAGE OF UTERUS  2011   Dr. Hulan Fray at [facility]   . [place]    . nsvd     x 2  . TUBAL LIGATION  1975  . VAGINAL DELIVERY     x2    There were no vitals filed for this visit.       Subjective Assessment - 05/31/16 0603    Subjective Pt reports no change in symptoms, she has been performing her HEP   Pertinent History pain with rolling in bed, laying in bed   How long can you sit comfortably? no problems   How long can you stand comfortably? immediate pain   How long can you walk comfortably? immediate pain   Diagnostic tests MRI - L4-L5 disc herniation   Patient Stated Goals decr. pain, return to exercise          Objective: Attempted extension exercises but discontinued due to pain.  Performed hooklying knee flops, pt required extensive cuing to decr. Lumbar lordosis and allow back to flex, which was then pain relieving for pt. Eventually performed 3x2 min. Issued this as HEP.                      PT Education - 06/01/16 0604    Education provided Yes   Education Details progression of  HEP   Person(s) Educated Patient   Methods Explanation   Comprehension Verbalized understanding             PT Long Term Goals - 05/28/16 [id number]      PT LONG TERM GOAL #1   Title pt will be able to stand 20 min with no c/o pain to return to cooking   Baseline pain with 5 min standing   Time 4   Period Weeks   Status New     PT LONG TERM GOAL #2   Title Pt will participate in 6MWT pain free to improve functional gait activity level   Baseline pain with 3 min walking   Time 4   Period Weeks   Status New     PT LONG TERM GOAL #3   Title Pt will be I with HEP to manage pain symptoms using flexion activity   Time 4   Period Weeks   Status New             Patient will benefit from skilled therapeutic intervention in order to improve the following deficits and impairments:  Pain, Improper body mechanics, Difficulty walking, Hypomobility, Decreased strength, Decreased range of  motion  Visit Diagnosis: Chronic midline low back pain with left-sided sciatica - Plan: PT plan of care cert/re-cert  Muscle weakness (generalized) - Plan: PT plan of care cert/re-cert  Difficulty in walking, not elsewhere classified - Plan: PT plan of care cert/re-cert     Problem List Patient Active Problem List   Diagnosis Date Noted  . Left-sided low back pain with left-sided sciatica 05/01/2016  . Excessive cerumen in right ear canal 05/01/2016  . Routine general medical examination at a health care facility 01/27/2016  . Need for hepatitis C screening test 04/21/2015  . History of colonic polyps   . Benign neoplasm of ascending colon   . Benign neoplasm of transverse colon   . Rash and nonspecific skin eruption 08/30/2014  . Postmenopausal estrogen deficiency 08/30/2014  . Left Achilles tendinitis 04/23/2014  . Chronic diastolic heart failure ([place]) 04/08/2014  . Obesity (BMI 30-39.9) 04/09/2013  . Diabetes mellitus type 2, controlled ([place]) 01/07/2013  . Edema 01/07/2013  . Medicare annual wellness visit, subsequent 11/28/2012  . Chronic hypoxemic respiratory failure ([place]) 11/11/2012  . Obstructive sleep apnea 11/11/2012  . Dyspnea 08/18/2012  . Fatigue 08/18/2012  . Meralgia paresthetica of left side 08/20/2011  . Myalgia 05/03/2011  . Leg pain, lateral 04/12/2011  . GOUT 12/26/2009  . CARDIOMYOPATHY 10/08/2007  . ALLERGIC RHINITIS, SEASONAL 10/08/2007  . CHOLECYSTECTOMY, HX OF 10/08/2007  . HYPERLIPIDEMIA, MIXED 05/02/2007    Betty [name] 06/01/2016, 6:30 AM  [facility] [address] [place], [address] Phone: [phone]   Fax:  [phone]  Name: Betty [name] MRN: [id number] Date of Birth: [birth year]

## 2016-06-05 ENCOUNTER — Ambulatory Visit: Payer: Medicare Other | Admitting: Physical Therapy

## 2016-06-05 DIAGNOSIS — M5442 Lumbago with sciatica, left side: Principal | ICD-10-CM

## 2016-06-05 DIAGNOSIS — M6281 Muscle weakness (generalized): Secondary | ICD-10-CM

## 2016-06-05 DIAGNOSIS — G8929 Other chronic pain: Secondary | ICD-10-CM

## 2016-06-05 NOTE — Therapy (Signed)
[facility] [address] [place], [address] Phone: [phone]   Fax:  [phone]  Physical Therapy Treatment  Patient Details  Name: Betty [name] MRN: [id number] Date of Birth: 02-17-1946 Referring Provider: Caryl Bis  Encounter Date: 06/05/2016      PT End of Session - 06/05/16 1037    Visit Number 3   Number of Visits 9   Date for PT Re-Evaluation 06/29/16   Authorization Type g code   PT Start Time 1030   PT Stop Time 1100   PT Time Calculation (min) 30 min   Activity Tolerance Patient tolerated treatment well      Past Medical History:  Diagnosis Date  . Allergic rhinitis    never tested, fall and spring  . Arthritis    hands,knees, feet  . Cancer ([place])    skin cancers only  . Cataracts, bilateral    not surgical yet  . CHF (congestive heart failure) (HCC)    Diastolic heart failure  . Diabetes mellitus without complication ([place])   . Diverticulosis    problems with frequent gas, burping  . Glucose intolerance (impaired glucose tolerance)   . Gout   . History of chicken pox   . Hyperlipidemia   . Hypertension   . NICM (nonischemic cardiomyopathy) ([place]) 2002   EF 25%; improved to normal - echo 4/08: EF 50-55%, mild MR, mild LAE, mild TR;    cath 3/03: normal cors, EF 40%  . PONV (postoperative nausea and vomiting)   . Shortness of breath dyspnea   . Sleep apnea    cpap settings 4    Past Surgical History:  Procedure Laterality Date  . BILATERAL VATS ABLATION Right    biopsy done 2015- [facility]  . BREAST BIOPSY Left 80's   Cyst removed  . CARDIAC CATHETERIZATION    . choleystectomy  02/2002  . COLONOSCOPY WITH PROPOFOL N/A 12/20/2014   Procedure: COLONOSCOPY WITH PROPOFOL;  Surgeon: [name], MD;  Location: [facility];  Service: Gastroenterology;  Laterality: N/A;  . CYSTECTOMY  1996   l breast  . DILATION AND CURETTAGE OF UTERUS  2011   Dr[facility] Hulan Fray at [facility]   . [place]    . nsvd     x 2  . TUBAL LIGATION  1975  . VAGINAL DELIVERY     x2    There were no vitals filed for this visit.      Subjective Assessment - 06/05/16 1036    Subjective Pt reports she is walking better, able to walk for longer periods of time since previous session and she feels she is able to maintain upright posture.    Pertinent History pain with rolling in bed, laying in bed   How long can you sit comfortably? no problems   How long can you stand comfortably? immediate pain   How long can you walk comfortably? immediate pain   Diagnostic tests MRI - L4-L5 disc herniation   Patient Stated Goals decr. pain, return to exercise    Currently in Pain? No/denies                Objective: Seated gentle PA mobs and STM performed on mid thoracic to sacral region. Unable to accurately assess level due to positional requirements.   Performed for 15 min, following this pt reported decr. Pain with standing.  Seated ball flexiont o facilitate improved motion/decr. Pain 3x10 with deep breath at end  range.  Standing lunge on stairs to work on improving leg strength while also maintaining lumbar flexed position. Performed 3x1 min on each side  Following this encouraged pt to return to hearttrac where she will perform nu-step as part of her exercise routine.                 PT Education - 06/05/16 1037    Education provided Yes   Education Details standing HEP   Person(s) Educated Patient   Methods Explanation   Comprehension Verbalized understanding             PT Long Term Goals - 05/28/16 0625      PT LONG TERM GOAL #1   Title pt will be able to stand 20 min with no c/o pain to return to cooking   Baseline pain with 5 min standing   Time 4   Period Weeks   Status New     PT LONG TERM GOAL #2   Title Pt will participate in 6MWT pain free to improve functional gait activity level   Baseline pain with 3 min walking   Time 4    Period Weeks   Status New     PT LONG TERM GOAL #3   Title Pt will be I with HEP to manage pain symptoms using flexion activity   Time 4   Period Weeks   Status New               Plan - 06/05/16 1037    Clinical Impression Statement Symptoms appear to be improving, pt is having minimal c/o pain at rest or when she is careful to avoid irritating behaviors. Pt is able to stand upright for up to 10 sec. with no pain, vs immediate pain prior to this session. Pt issued further flexion based exercises to be performed in standing.   Rehab Potential Fair   Clinical Impairments Affecting Rehab Potential medical comorbidities/motivation   PT Frequency 2x / week   PT Duration 4 weeks   PT Treatment/Interventions ADLs/Self Care Home Management;Aquatic Therapy;Manual techniques;Therapeutic exercise;Functional mobility training;Therapeutic activities;Patient/family education   Consulted and Agree with Plan of Care Patient      Patient will benefit from skilled therapeutic intervention in order to improve the following deficits and impairments:  Pain, Improper body mechanics, Difficulty walking, Hypomobility, Decreased strength, Decreased range of motion  Visit Diagnosis: Chronic midline low back pain with left-sided sciatica  Muscle weakness (generalized)     Problem List Patient Active Problem List   Diagnosis Date Noted  . Left-sided low back pain with left-sided sciatica 05/01/2016  . Excessive cerumen in right ear canal 05/01/2016  . Routine general medical examination at a health care facility 01/27/2016  . Need for hepatitis C screening test 04/21/2015  . History of colonic polyps   . Benign neoplasm of ascending colon   . Benign neoplasm of transverse colon   . Rash and nonspecific skin eruption 08/30/2014  . Postmenopausal estrogen deficiency 08/30/2014  . Left Achilles tendinitis 04/23/2014  . Chronic diastolic heart failure ([place]) 04/08/2014  . Obesity (BMI 30-39.9)  04/09/2013  . Diabetes mellitus type 2, controlled ([place]) 01/07/2013  . Edema 01/07/2013  . Medicare annual wellness visit, subsequent 11/28/2012  . Chronic hypoxemic respiratory failure ([place]) 11/11/2012  . Obstructive sleep apnea 11/11/2012  . Dyspnea 08/18/2012  . Fatigue 08/18/2012  . Meralgia paresthetica of left side 08/20/2011  . Myalgia 05/03/2011  . Leg pain, lateral 04/12/2011  . GOUT 12/26/2009  .  CARDIOMYOPATHY 10/08/2007  . ALLERGIC RHINITIS, SEASONAL 10/08/2007  . CHOLECYSTECTOMY, HX OF 10/08/2007  . HYPERLIPIDEMIA, MIXED 05/02/2007    Elya Diloreto 06/05/2016, 11:51 AM  [facility] [address] [place], [address] Phone: [phone]   Fax:  [phone]  Name: Betty [name] MRN: [id number] Date of Birth: Jul 10, 1946

## 2016-06-12 ENCOUNTER — Encounter: Payer: Medicare Other | Admitting: Physical Therapy

## 2016-06-14 ENCOUNTER — Other Ambulatory Visit (HOSPITAL_COMMUNITY): Payer: Self-pay | Admitting: Internal Medicine

## 2016-06-15 ENCOUNTER — Ambulatory Visit: Payer: Medicare Other | Admitting: Physical Therapy

## 2016-06-15 DIAGNOSIS — M5442 Lumbago with sciatica, left side: Principal | ICD-10-CM

## 2016-06-15 DIAGNOSIS — G8929 Other chronic pain: Secondary | ICD-10-CM

## 2016-06-15 DIAGNOSIS — R262 Difficulty in walking, not elsewhere classified: Secondary | ICD-10-CM

## 2016-06-15 NOTE — Therapy (Signed)
[facility] [address], [place], [address] Phone: [phone]   Fax:  [phone]  Physical Therapy Treatment  Patient Details  Name: Betty [name] MRN: [id number] Date of Birth: March 16, 1946 Referring Provider: Caryl Bis  Encounter Date: 06/15/2016      PT End of Session - 06/15/16 0928    Visit Number 4   Number of Visits 9   Date for PT Re-Evaluation 06/29/16   Authorization Type g code   PT Start Time 0900   PT Stop Time 0930   PT Time Calculation (min) 30 min   Activity Tolerance Patient tolerated treatment well      Past Medical History:  Diagnosis Date  . Allergic rhinitis    never tested, fall and spring  . Arthritis    hands,knees, feet  . Cancer ([place])    skin cancers only  . Cataracts, bilateral    not surgical yet  . CHF (congestive heart failure) (HCC)    Diastolic heart failure  . Diabetes mellitus without complication ([place])   . Diverticulosis    problems with frequent gas, burping  . Glucose intolerance (impaired glucose tolerance)   . Gout   . History of chicken pox   . Hyperlipidemia   . Hypertension   . NICM (nonischemic cardiomyopathy) ([place]) 2002   EF 25%; improved to normal - echo 4/08: EF 50-55%, mild MR, mild LAE, mild TR;    cath 3/03: normal cors, EF 40%  . PONV (postoperative nausea and vomiting)   . Shortness of breath dyspnea   . Sleep apnea    cpap settings 4    Past Surgical History:  Procedure Laterality Date  . BILATERAL VATS ABLATION Right    biopsy done 2015- [facility]  . BREAST BIOPSY Left 80's   Cyst removed  . CARDIAC CATHETERIZATION    . choleystectomy  02/2002  . COLONOSCOPY WITH PROPOFOL N/A 12/20/2014   Procedure: COLONOSCOPY WITH PROPOFOL;  Surgeon: [name], MD;  [facility]: [facility];  Service: Gastroenterology;  Laterality: N/A;  . CYSTECTOMY  1996   l breast  . DILATION AND CURETTAGE OF UTERUS  2011   Dr. Hulan Fray at [facility]   . [place]    . nsvd     x 2  . TUBAL LIGATION  1975  . VAGINAL DELIVERY     x2    There were no vitals filed for this visit.      Subjective Assessment - 06/15/16 0907    Subjective Pt reports continuing to walk better. Is having some pain in her hip joint.   Pertinent History pain with rolling in bed, laying in bed   How long can you sit comfortably? no problems   How long can you stand comfortably? immediate pain   How long can you walk comfortably? immediate pain   Diagnostic tests MRI - L4-L5 disc herniation   Patient Stated Goals decr. pain, return to exercise    Currently in Pain? Yes   Pain Score 1    Pain Location Hip   Pain Orientation Left                Objective: R SL STM and MFR performed on L QL, glute med/min/max, vastus lateralis in pattern of pain.  Assessed pt for source of pain, no pain with resisted hip abduction or ER, no focal pain with palpation of greater trochanter.  Following STM pt able to amb with pain  decr. From 3/10 to 0/10.   Educated pt on use of tennis ball to perform self massage techniques to continue progression of treatment.                   PT Education - 06/15/16 0928    Education provided Yes   Education Details tennis ball self massage   Person(s) Educated Patient   Methods Explanation   Comprehension Verbalized understanding             PT Long Term Goals - 05/28/16 [id number]      PT LONG TERM GOAL #1   Title pt will be able to stand 20 min with no c/o pain to return to cooking   Baseline pain with 5 min standing   Time 4   Period Weeks   Status New     PT LONG TERM GOAL #2   Title Pt will participate in 6MWT pain free to improve functional gait activity level   Baseline pain with 3 min walking   Time 4   Period Weeks   Status New     PT LONG TERM GOAL #3   Title Pt will be I with HEP to manage pain symptoms using flexion activity   Time 4   Period Weeks   Status New                Plan - 06/15/16 0929    Clinical Impression Statement Pt continues to progress back pain, however is having continued irritation of hip, which is increasing over time. Assessed this, does not appear to be bursitis or glute tendinopathy but does respond to MFR. Issued self MFR for performance at home.   Rehab Potential Fair   Clinical Impairments Affecting Rehab Potential medical comorbidities/motivation   PT Frequency 2x / week   PT Duration 4 weeks   PT Treatment/Interventions ADLs/Self Care Home Management;Aquatic Therapy;Manual techniques;Therapeutic exercise;Functional mobility training;Therapeutic activities;Patient/family education   Consulted and Agree with Plan of Care Patient      Patient will benefit from skilled therapeutic intervention in order to improve the following deficits and impairments:  Pain, Improper body mechanics, Difficulty walking, Hypomobility, Decreased strength, Decreased range of motion  Visit Diagnosis: Chronic midline low back pain with left-sided sciatica  Difficulty in walking, not elsewhere classified     Problem List Patient Active Problem List   Diagnosis Date Noted  . Left-sided low back pain with left-sided sciatica 05/01/2016  . Excessive cerumen in right ear canal 05/01/2016  . Routine general medical examination at a health care facility 01/27/2016  . Need for hepatitis C screening test 04/21/2015  . History of colonic polyps   . Benign neoplasm of ascending colon   . Benign neoplasm of transverse colon   . Rash and nonspecific skin eruption 08/30/2014  . Postmenopausal estrogen deficiency 08/30/2014  . Left Achilles tendinitis 04/23/2014  . Chronic diastolic heart failure ([place]) 04/08/2014  . Obesity (BMI 30-39.9) 04/09/2013  . Diabetes mellitus type 2, controlled ([place]) 01/07/2013  . Edema 01/07/2013  . Medicare annual wellness visit, subsequent 11/28/2012  . Chronic hypoxemic respiratory failure ([place]) 11/11/2012   . Obstructive sleep apnea 11/11/2012  . Dyspnea 08/18/2012  . Fatigue 08/18/2012  . Meralgia paresthetica of left side 08/20/2011  . Myalgia 05/03/2011  . Leg pain, lateral 04/12/2011  . GOUT 12/26/2009  . CARDIOMYOPATHY 10/08/2007  . ALLERGIC RHINITIS, SEASONAL 10/08/2007  . CHOLECYSTECTOMY, HX OF 10/08/2007  . HYPERLIPIDEMIA, MIXED 05/02/2007    Fisher,Benjamin PT  DPT 06/15/2016, 9:31 AM  [facility] [address] [place], [address] Phone: [phone]   Fax:  [phone]  Name: MALTA DOERSAM MRN: [id number] Date of Birth: 21-Apr-1946

## 2016-06-22 ENCOUNTER — Ambulatory Visit: Payer: Medicare Other | Admitting: Physical Therapy

## 2016-06-22 DIAGNOSIS — M5442 Lumbago with sciatica, left side: Principal | ICD-10-CM

## 2016-06-22 DIAGNOSIS — R262 Difficulty in walking, not elsewhere classified: Secondary | ICD-10-CM

## 2016-06-22 DIAGNOSIS — G8929 Other chronic pain: Secondary | ICD-10-CM

## 2016-06-22 NOTE — Therapy (Signed)
[facility] [address], [place], [address] Phone: [phone]   Fax:  [phone]  Physical Therapy Treatment  Patient Details  Name: Betty [name] MRN: [id number] Date of Birth: [birth year] Referring Provider: Caryl Bis  Encounter Date: 06/22/2016      PT End of Session - 06/22/16 0857    Visit Number 5   Number of Visits 9   Date for PT Re-Evaluation 06/29/16   Authorization Type g code   PT Start Time [id number]   PT Stop Time 0917   PT Time Calculation (min) 24 min   Activity Tolerance Patient tolerated treatment well      Past Medical History:  Diagnosis Date  . Allergic rhinitis    never tested, fall and spring  . Arthritis    hands,knees, feet  . Cancer ([place])    skin cancers only  . Cataracts, bilateral    not surgical yet  . CHF (congestive heart failure) (HCC)    Diastolic heart failure  . Diabetes mellitus without complication ([place])   . Diverticulosis    problems with frequent gas, burping  . Glucose intolerance (impaired glucose tolerance)   . Gout   . History of chicken pox   . Hyperlipidemia   . Hypertension   . NICM (nonischemic cardiomyopathy) ([place]) 2002   EF 25%; improved to normal - echo 4/08: EF 50-55%, mild MR, mild LAE, mild TR;    cath 3/03: normal cors, EF 40%  . PONV (postoperative nausea and vomiting)   . Shortness of breath dyspnea   . Sleep apnea    cpap settings 4    Past Surgical History:  Procedure Laterality Date  . BILATERAL VATS ABLATION Right    biopsy done 2015- [facility]  . BREAST BIOPSY Left 80's   Cyst removed  . CARDIAC CATHETERIZATION    . choleystectomy  02/2002  . COLONOSCOPY WITH PROPOFOL N/A 12/20/2014   Procedure: COLONOSCOPY WITH PROPOFOL;  Surgeon: [name], MD;  Location: [facility];  Service: Gastroenterology;  Laterality: N/A;  . CYSTECTOMY  1996   l breast  . DILATION AND CURETTAGE OF UTERUS  2011   Dr. Hulan Fray at [facility]   . [place]    . nsvd     x 2  . TUBAL LIGATION  1975  . VAGINAL DELIVERY     x2    There were no vitals filed for this visit.      Subjective Assessment - 06/22/16 0855    Subjective Pt reports her back is "not wonderful" today. She is able to walk but continues to have pain with this.   Pertinent History pain with rolling in bed, laying in bed   How long can you sit comfortably? no problems   How long can you stand comfortably? immediate pain   How long can you walk comfortably? immediate pain   Diagnostic tests MRI - L4-L5 disc herniation   Patient Stated Goals decr. pain, return to exercise    Currently in Pain? No/denies                   Objective: Seated MFR and STM performed on B lumbar paraspinals, erector spinae, QL. Pt unable to tolerate other positions due to pain in back. Periodically had pt perform sustained flexion stretch or walk briefly between bouts of treatment.  Following session pt able to walk out to car pain free (pt had pain with walking into clinic  so this may be indicative of improvement)                    PT Long Term Goals - 05/28/16 [id number]      PT LONG TERM GOAL #1   Title pt will be able to stand 20 min with no c/o pain to return to cooking   Baseline pain with 5 min standing   Time 4   Period Weeks   Status New     PT LONG TERM GOAL #2   Title Pt will participate in 6MWT pain free to improve functional gait activity level   Baseline pain with 3 min walking   Time 4   Period Weeks   Status New     PT LONG TERM GOAL #3   Title Pt will be I with HEP to manage pain symptoms using flexion activity   Time 4   Period Weeks   Status New               Plan - 06/22/16 [id number]    Clinical Impression Statement Pt responded well to session focusing on manual intervention with stretching. Pt has not performed MFR at home so focused on re-educating pt on this.   Rehab Potential Fair   Clinical Impairments  Affecting Rehab Potential medical comorbidities/motivation   PT Frequency 2x / week   PT Duration 4 weeks   PT Treatment/Interventions ADLs/Self Care Home Management;Aquatic Therapy;Manual techniques;Therapeutic exercise;Functional mobility training;Therapeutic activities;Patient/family education   Consulted and Agree with Plan of Care Patient      Patient will benefit from skilled therapeutic intervention in order to improve the following deficits and impairments:  Pain, Improper body mechanics, Difficulty walking, Hypomobility, Decreased strength, Decreased range of motion  Visit Diagnosis: Chronic midline low back pain with left-sided sciatica  Difficulty in walking, not elsewhere classified     Problem List Patient Active Problem List   Diagnosis Date Noted  . Left-sided low back pain with left-sided sciatica 05/01/2016  . Excessive cerumen in right ear canal 05/01/2016  . Routine general medical examination at a health care facility 01/27/2016  . Need for hepatitis C screening test 04/21/2015  . History of colonic polyps   . Benign neoplasm of ascending colon   . Benign neoplasm of transverse colon   . Rash and nonspecific skin eruption 08/30/2014  . Postmenopausal estrogen deficiency 08/30/2014  . Left Achilles tendinitis 04/23/2014  . Chronic diastolic heart failure ([place]) 04/08/2014  . Obesity (BMI 30-39.9) 04/09/2013  . Diabetes mellitus type 2, controlled ([place]) 01/07/2013  . Edema 01/07/2013  . Medicare annual wellness visit, subsequent 11/28/2012  . Chronic hypoxemic respiratory failure ([place]) 11/11/2012  . Obstructive sleep apnea 11/11/2012  . Dyspnea 08/18/2012  . Fatigue 08/18/2012  . Meralgia paresthetica of left side 08/20/2011  . Myalgia 05/03/2011  . Leg pain, lateral 04/12/2011  . GOUT 12/26/2009  . CARDIOMYOPATHY 10/08/2007  . ALLERGIC RHINITIS, SEASONAL 10/08/2007  . CHOLECYSTECTOMY, HX OF 10/08/2007  . HYPERLIPIDEMIA, MIXED 05/02/2007     Fisher,Benjamin PT DPT 06/22/2016, 10:18 AM  [facility] [address] [place], [address] Phone: [phone]   Fax:  [phone]  Name: Betty [name] MRN: [id number] Date of Birth: [birth year]

## 2016-06-29 ENCOUNTER — Ambulatory Visit: Payer: Medicare Other | Attending: Family Medicine | Admitting: Physical Therapy

## 2016-06-29 DIAGNOSIS — G8929 Other chronic pain: Secondary | ICD-10-CM

## 2016-06-29 DIAGNOSIS — M5442 Lumbago with sciatica, left side: Secondary | ICD-10-CM | POA: Insufficient documentation

## 2016-06-29 DIAGNOSIS — R262 Difficulty in walking, not elsewhere classified: Secondary | ICD-10-CM | POA: Diagnosis present

## 2016-06-29 DIAGNOSIS — M6281 Muscle weakness (generalized): Secondary | ICD-10-CM | POA: Insufficient documentation

## 2016-06-30 NOTE — Therapy (Signed)
[facility] [address], [place], [address] Phone: [phone]   Fax:  [phone]  Physical Therapy Treatment  Patient Details  Name: Betty [name] MRN: [id number] Date of Birth: [birth year] Referring Provider: Caryl Bis  Encounter Date: 06/29/2016      PT End of Session - 06/29/16 1425    Visit Number 6   Number of Visits 9   Date for PT Re-Evaluation 06/29/16   Authorization Type g code   PT Start Time [id number]   PT Stop Time 1030   PT Time Calculation (min) 41 min   Activity Tolerance Patient tolerated treatment well      Past Medical History:  Diagnosis Date  . Allergic rhinitis    never tested, fall and spring  . Arthritis    hands,knees, feet  . Cancer ([place])    skin cancers only  . Cataracts, bilateral    not surgical yet  . CHF (congestive heart failure) (HCC)    Diastolic heart failure  . Diabetes mellitus without complication ([place])   . Diverticulosis    problems with frequent gas, burping  . Glucose intolerance (impaired glucose tolerance)   . Gout   . History of chicken pox   . Hyperlipidemia   . Hypertension   . NICM (nonischemic cardiomyopathy) ([place]) 2002   EF 25%; improved to normal - echo 4/08: EF 50-55%, mild MR, mild LAE, mild TR;    cath 3/03: normal cors, EF 40%  . PONV (postoperative nausea and vomiting)   . Shortness of breath dyspnea   . Sleep apnea    cpap settings 4    Past Surgical History:  Procedure Laterality Date  . BILATERAL VATS ABLATION Right    biopsy done 2015- [facility]  . BREAST BIOPSY Left 80's   Cyst removed  . CARDIAC CATHETERIZATION    . choleystectomy  02/2002  . COLONOSCOPY WITH PROPOFOL N/A 12/20/2014   Procedure: COLONOSCOPY WITH PROPOFOL;  Surgeon: [name], MD;  Location: [facility];  Service: Gastroenterology;  Laterality: N/A;  . CYSTECTOMY  1996   l breast  . DILATION AND CURETTAGE OF UTERUS  2011   Dr. Hulan Fray at [facility]  .  [place]    . nsvd     x 2  . TUBAL LIGATION  1975  . VAGINAL DELIVERY     x2    There were no vitals filed for this visit.      Subjective Assessment - 06/29/16 0954    Subjective Pt reports some cold in her LLE. pain is going away faster but still consistent with upright and leaning back.   Pertinent History pain with rolling in bed, laying in bed   How long can you sit comfortably? no problems   How long can you stand comfortably? immediate pain   How long can you walk comfortably? immediate pain   Diagnostic tests MRI - L4-L5 disc herniation   Patient Stated Goals decr. pain, return to exercise    Currently in Pain? No/denies                   Objective: Seated TA contractions, extensive training on performance, then 3x5 with 5 sec holds.  With TA contraction, performed adductor squeeze for pain relief 3x10 with 5 sec. Holds.  RTB abductors exercise, required cuing for performance to activate glutes 3x10.  Seated circling focusing on improving back activation, 3x10 each direction.  RTB scapular retractions with  cuing to maintain posture in most extended tolerable position.  Pt reported no pain following session.                   PT Long Term Goals - 05/28/16 [id number]      PT LONG TERM GOAL #1   Title pt will be able to stand 20 min with no c/o pain to return to cooking   Baseline pain with 5 min standing   Time 4   Period Weeks   Status New     PT LONG TERM GOAL #2   Title Pt will participate in 6MWT pain free to improve functional gait activity level   Baseline pain with 3 min walking   Time 4   Period Weeks   Status New     PT LONG TERM GOAL #3   Title Pt will be I with HEP to manage pain symptoms using flexion activity   Time 4   Period Weeks   Status New               Plan - 06/29/16 1426    Clinical Impression Statement Progressed pt to exercise based tx to attempt to progress to decr. pain and incr.  functional status. pt tolerated tx well, did not find exercises challenging but had decr. pain following adduction exercises.   Rehab Potential Fair   Clinical Impairments Affecting Rehab Potential medical comorbidities/motivation   PT Frequency 2x / week   PT Duration 4 weeks   PT Treatment/Interventions ADLs/Self Care Home Management;Aquatic Therapy;Manual techniques;Therapeutic exercise;Functional mobility training;Therapeutic activities;Patient/family education   Consulted and Agree with Plan of Care Patient      Patient will benefit from skilled therapeutic intervention in order to improve the following deficits and impairments:  Pain, Improper body mechanics, Difficulty walking, Hypomobility, Decreased strength, Decreased range of motion  Visit Diagnosis: Chronic midline low back pain with left-sided sciatica  Difficulty in walking, not elsewhere classified     Problem List Patient Active Problem List   Diagnosis Date Noted  . Left-sided low back pain with left-sided sciatica 05/01/2016  . Excessive cerumen in right ear canal 05/01/2016  . Routine general medical examination at a health care facility 01/27/2016  . Need for hepatitis C screening test 04/21/2015  . History of colonic polyps   . Benign neoplasm of ascending colon   . Benign neoplasm of transverse colon   . Rash and nonspecific skin eruption 08/30/2014  . Postmenopausal estrogen deficiency 08/30/2014  . Left Achilles tendinitis 04/23/2014  . Chronic diastolic heart failure ([place]) 04/08/2014  . Obesity (BMI 30-39.9) 04/09/2013  . Diabetes mellitus type 2, controlled ([place]) 01/07/2013  . Edema 01/07/2013  . Medicare annual wellness visit, subsequent 11/28/2012  . Chronic hypoxemic respiratory failure ([place]) 11/11/2012  . Obstructive sleep apnea 11/11/2012  . Dyspnea 08/18/2012  . Fatigue 08/18/2012  . Meralgia paresthetica of left side 08/20/2011  . Myalgia 05/03/2011  . Leg pain, lateral 04/12/2011  . GOUT  12/26/2009  . CARDIOMYOPATHY 10/08/2007  . ALLERGIC RHINITIS, SEASONAL 10/08/2007  . CHOLECYSTECTOMY, HX OF 10/08/2007  . HYPERLIPIDEMIA, MIXED 05/02/2007    Fisher,Benjamin PT DPT 06/30/2016, 2:30 PM  [facility] [address] [place], [address] Phone: [phone]   Fax:  [phone]  Name: Betty [name] MRN: [id number] Date of Birth: [birth year]

## 2016-07-03 ENCOUNTER — Encounter: Payer: Self-pay | Admitting: Physical Therapy

## 2016-07-03 ENCOUNTER — Ambulatory Visit: Payer: Medicare Other

## 2016-07-03 DIAGNOSIS — M5442 Lumbago with sciatica, left side: Secondary | ICD-10-CM | POA: Diagnosis not present

## 2016-07-03 DIAGNOSIS — G8929 Other chronic pain: Secondary | ICD-10-CM

## 2016-07-03 DIAGNOSIS — M6281 Muscle weakness (generalized): Secondary | ICD-10-CM

## 2016-07-03 NOTE — Therapy (Signed)
[facility] [address] [place], [address] Phone: [phone]   Fax:  [phone]  Physical Therapy Treatment  Patient Details  Name: Betty [name] MRN: [id number] Date of Birth: 04/12/1946 Referring Provider: Caryl Bis  Encounter Date: 07/03/2016      PT End of Session - 07/03/16 0921    Visit Number 7   Number of Visits 17   Date for PT Re-Evaluation 07/31/16   Authorization Type g code 1/10   PT Start Time 0915   PT Stop Time 1000   PT Time Calculation (min) 45 min   Activity Tolerance Patient tolerated treatment well   Behavior During Therapy [facility] for tasks assessed/performed      Past Medical History:  Diagnosis Date  . Allergic rhinitis    never tested, fall and spring  . Arthritis    hands,knees, feet  . Cancer ([place])    skin cancers only  . Cataracts, bilateral    not surgical yet  . CHF (congestive heart failure) (HCC)    Diastolic heart failure  . Diabetes mellitus without complication ([place])   . Diverticulosis    problems with frequent gas, burping  . Glucose intolerance (impaired glucose tolerance)   . Gout   . History of chicken pox   . Hyperlipidemia   . Hypertension   . NICM (nonischemic cardiomyopathy) ([place]) 2002   EF 25%; improved to normal - echo 4/08: EF 50-55%, mild MR, mild LAE, mild TR;    cath 3/03: normal cors, EF 40%  . PONV (postoperative nausea and vomiting)   . Shortness of breath dyspnea   . Sleep apnea    cpap settings 4    Past Surgical History:  Procedure Laterality Date  . BILATERAL VATS ABLATION Right    biopsy done 2015- [facility]  . BREAST BIOPSY Left 80's   Cyst removed  . CARDIAC CATHETERIZATION    . choleystectomy  02/2002  . COLONOSCOPY WITH PROPOFOL N/A 12/20/2014   Procedure: COLONOSCOPY WITH PROPOFOL;  Surgeon: [name], MD;  [facility]: [facility];  Service: Gastroenterology;  Laterality: N/A;  . CYSTECTOMY  1996   l breast  . DILATION  AND CURETTAGE OF UTERUS  2011   Dr. Hulan Fray at [facility]  . [place]    . nsvd     x 2  . TUBAL LIGATION  1975  . VAGINAL DELIVERY     x2    There were no vitals filed for this visit.      Subjective Assessment - 07/03/16 0918    Subjective Pt states that she continues to have pain on this date. She reports that she is not consistent with her HEP due to "the last few weeks being crazy." No specific questions or concerns at this time. She reports that she feels approximately 40% improved since starting therapy;   Pertinent History pain with rolling in bed, laying in bed   How long can you sit comfortably? no problems   How long can you stand comfortably? immediate pain   How long can you walk comfortably? immediate pain   Diagnostic tests MRI - L4-L5 disc herniation   Patient Stated Goals decr. pain, return to exercise    Currently in Pain? Yes   Pain Score 3    Pain Location Hip   Pain Orientation Left          TREATMENT  THER-EX Goals reviewed and updated with patient; 6MWT: after 2.5  minutes pain has increased within MCID from 3/10 at baseline to 5/10, pt ambulated a total of approximately 500' in that time frame; No pain or reduction reported with L single knee to chest or L hip IR/ER stretch at 90 hip flexion position; Long axis L hip belt distraction mobilization, grade I-II, 30 seconds/bout x 3 bouts, pt reports no pain in hooklying position even before mobilizations. Pt reports tingle in L low back with mobilizations but no pain; Attempted inferior and lateral hip mobs with belt but pt unable to tolerate due to pain at area where belt crosses leg despite padding so discontinued; Double knee to chest stretch 30 second hold; Attempted hooklying TA contractions but pt unable to consistent fire TA without engaging her superficial muscles; Hooklying posterior pelvic tilts with extensive cuing 5s hold x 5, repeated; Hooklying posterior pelvic tilts with  alternating leg slides on table, pt struggles to maintain tilt and requires heavy verbal and tactile cues to do so x 5 bilateral;                      PT Education - 07/03/16 0921    Education provided Yes   Education Details Reinforced importance of HEP   Person(s) Educated Patient   Methods Explanation   Comprehension Verbalized understanding             PT Long Term Goals - 07/03/16 0922      PT LONG TERM GOAL #1   Title pt will be able to stand 20 min with no c/o pain to return to cooking   Baseline pain with 5 min standing; 07/03/16: 10 minutes   Time 4   Period Weeks   Status On-going     PT LONG TERM GOAL #2   Title Pt will participate in 6MWT pain free to improve functional gait activity level   Baseline pain with 3 min walking;  07/03/16:    Time 4   Period Weeks   Status On-going     PT LONG TERM GOAL #3   Title Pt will be I with HEP to manage pain symptoms using flexion activity   Baseline 07/03/16: not consistent with HEP   Time 4   Period Weeks   Status On-going     PT LONG TERM GOAL #4   Title Pt will decreased mODI by at least 13% in order to demonstrate clinically significant reduction in pain   Baseline 07/03/16: 38%   Time 4   Period Weeks   Status New               Plan - 07/03/16 [id number]    Clinical Impression Statement Pt reports approximately 40% improvement in her symptoms on this date. She is able to stand for extended periods of time but is still limited. She scored 38% on modified ODI inidicating moderate self-perception of disability related to back pain. She will benefit from continued skilled PT services to address remaining deficits and improve function at home.    Rehab Potential Fair   Clinical Impairments Affecting Rehab Potential medical comorbidities/motivation   PT Frequency 2x / week   PT Duration 4 weeks   PT Treatment/Interventions ADLs/Self Care Home Management;Aquatic Therapy;Manual techniques;Therapeutic  exercise;Functional mobility training;Therapeutic activities;Patient/family education   PT Next Visit Plan Continues with strengthening   PT Home Exercise Plan As prescribed   Consulted and Agree with Plan of Care Patient      Patient will benefit from skilled therapeutic intervention in  order to improve the following deficits and impairments:  Pain, Improper body mechanics, Difficulty walking, Hypomobility, Decreased strength, Decreased range of motion  Visit Diagnosis: Chronic midline low back pain with left-sided sciatica - Plan: PT plan of care cert/re-cert  Muscle weakness (generalized) - Plan: PT plan of care cert/re-cert       G-Codes - 2016/07/20 1725    Functional Assessment Tool Used 6MWT, NPRS, mODI, standing tolerance   Functional Limitation Mobility: Walking and moving around   Mobility: Walking and Moving Around Current Status [id number]) At least 20 percent but less than 40 percent impaired, limited or restricted   Mobility: Walking and Moving Around Goal Status [phone]) At least 20 percent but less than 40 percent impaired, limited or restricted      Problem List Patient Active Problem List   Diagnosis Date Noted  . Left-sided low back pain with left-sided sciatica 05/01/2016  . Excessive cerumen in right ear canal 05/01/2016  . Routine general medical examination at a health care facility 01/27/2016  . Need for hepatitis C screening test 04/21/2015  . History of colonic polyps   . Benign neoplasm of ascending colon   . Benign neoplasm of transverse colon   . Rash and nonspecific skin eruption 08/30/2014  . Postmenopausal estrogen deficiency 08/30/2014  . Left Achilles tendinitis 04/23/2014  . Chronic diastolic heart failure ([place]) 04/08/2014  . Obesity (BMI 30-39.9) 04/09/2013  . Diabetes mellitus type 2, controlled ([place]) 01/07/2013  . Edema 01/07/2013  . Medicare annual wellness visit, subsequent 11/28/2012  . Chronic hypoxemic respiratory failure ([place]) 11/11/2012   . Obstructive sleep apnea 11/11/2012  . Dyspnea 08/18/2012  . Fatigue 08/18/2012  . Meralgia paresthetica of left side 08/20/2011  . Myalgia 05/03/2011  . Leg pain, lateral 04/12/2011  . GOUT 12/26/2009  . CARDIOMYOPATHY 10/08/2007  . ALLERGIC RHINITIS, SEASONAL 10/08/2007  . CHOLECYSTECTOMY, HX OF 10/08/2007  . HYPERLIPIDEMIA, MIXED 05/02/2007   Phillips Grout PT, DPT   Eoin Willden 2016/07/20, 5:27 PM  [facility] [address] [place], [address] Phone: [phone]   Fax:  [phone]  Name: Betty [name] MRN: [id number] Date of Birth: 10-Nov-1945

## 2016-07-05 ENCOUNTER — Ambulatory Visit: Payer: Medicare Other | Admitting: Physical Therapy

## 2016-07-05 DIAGNOSIS — M5442 Lumbago with sciatica, left side: Secondary | ICD-10-CM | POA: Diagnosis not present

## 2016-07-05 DIAGNOSIS — M6281 Muscle weakness (generalized): Secondary | ICD-10-CM

## 2016-07-05 DIAGNOSIS — G8929 Other chronic pain: Secondary | ICD-10-CM
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
## 2016-07-09 ENCOUNTER — Ambulatory Visit: Payer: Medicare Other | Admitting: Physical Therapy

## 2016-07-17 ENCOUNTER — Ambulatory Visit: Payer: Medicare Other | Admitting: Physical Therapy

## 2016-07-17 DIAGNOSIS — G8929 Other chronic pain: Secondary | ICD-10-CM

## 2016-07-17 DIAGNOSIS — M5442 Lumbago with sciatica, left side: Secondary | ICD-10-CM | POA: Diagnosis not present
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
## 2016-07-23 ENCOUNTER — Ambulatory Visit: Payer: Medicare Other | Admitting: Physical Therapy

## 2016-07-23 ENCOUNTER — Encounter: Payer: Medicare Other | Admitting: Physical Therapy

## 2016-07-23 DIAGNOSIS — M5442 Lumbago with sciatica, left side: Secondary | ICD-10-CM

## 2016-07-23 DIAGNOSIS — G8929 Other chronic pain: Secondary | ICD-10-CM

## 2016-07-23 DIAGNOSIS — R262 Difficulty in walking, not elsewhere classified: Secondary | ICD-10-CM

## 2016-07-24 NOTE — Therapy (Signed)
[place] [facility] [address] [place], [address] Phone: [phone]   Fax:  [phone]  Physical Therapy Treatment  Patient Details  Name: Betty [name] MRN: [id number] Date of Birth: 02/21/1946 Referring Provider: Caryl Bis  Encounter Date: 07/23/2016      PT End of Session - 07/23/16 0705    Visit Number 10   Number of Visits 17   Date for PT Re-Evaluation 07/31/16   Authorization Type g code 1/10   PT Start Time 0948   PT Stop Time 1026   PT Time Calculation (min) 38 min   Activity Tolerance Patient tolerated treatment well   Behavior During Therapy [facility] for tasks assessed/performed      Past Medical History:  Diagnosis Date  . Allergic rhinitis    never tested, fall and spring  . Arthritis    hands,knees, feet  . Cancer ([place])    skin cancers only  . Cataracts, bilateral    not surgical yet  . CHF (congestive heart failure) (HCC)    Diastolic heart failure  . Diabetes mellitus without complication ([place])   . Diverticulosis    problems with frequent gas, burping  . Glucose intolerance (impaired glucose tolerance)   . Gout   . History of chicken pox   . Hyperlipidemia   . Hypertension   . NICM (nonischemic cardiomyopathy) ([place]) 2002   EF 25%; improved to normal - echo 4/08: EF 50-55%, mild MR, mild LAE, mild TR;    cath 3/03: normal cors, EF 40%  . PONV (postoperative nausea and vomiting)   . Shortness of breath dyspnea   . Sleep apnea    cpap settings 4    Past Surgical History:  Procedure Laterality Date  . BILATERAL VATS ABLATION Right    biopsy done 2015- [facility]  . BREAST BIOPSY Left 80's   Cyst removed  . CARDIAC CATHETERIZATION    . choleystectomy  02/2002  . COLONOSCOPY WITH PROPOFOL N/A 12/20/2014   Procedure: COLONOSCOPY WITH PROPOFOL;  Surgeon: [name], MD;  Location: [facility];  Service: Gastroenterology;  Laterality: N/A;  . CYSTECTOMY  1996   l breast  .  DILATION AND CURETTAGE OF UTERUS  2011   Dr. Hulan Fray at [facility]  . [place]    . nsvd     x 2  . TUBAL LIGATION  1975  . VAGINAL DELIVERY     x2    There were no vitals filed for this visit.      Subjective Assessment - 07/23/16 0950    Subjective Pt stood up and prepared food over the weekend and Thanksgiving, her pain level was significant due to that.   Pertinent History pain with rolling in bed, laying in bed   How long can you sit comfortably? no problems   How long can you stand comfortably? immediate pain   How long can you walk comfortably? immediate pain   Diagnostic tests MRI - L4-L5 disc herniation   Patient Stated Goals decr. pain, return to exercise    Currently in Pain? Yes   Pain Score 2                  Objective: STM performed on L glute, IT band. Pt instructed in self performance of this using hands, also using "stick" roller.  Utilized stick roller for back to assess if this is helpful for pt, she reported decr. Pain with standing from 4/10 to  2/10 following this tx. Pt verbalized understanding of this.  Seated isometric glute activation exercise, pt had difficulty with performance of this.  Standing wt shifting to focus on glute activation, less painful when performed in deeper wider stance, instructed pt on performance of this at narrowest stance possible for pt. Performed 3x2 min.  Pt responded well to there ex and STM with decr. Pain and improved undertsanding of how to perform exercises.                PT Education - 07/23/16 1020    Education provided Yes   Person(s) Educated Patient   Methods Explanation   Comprehension Verbalized understanding             PT Long Term Goals - 07/03/16 0922      PT LONG TERM GOAL #1   Title pt will be able to stand 20 min with no c/o pain to return to cooking   Baseline pain with 5 min standing; 07/03/16: 10 minutes   Time 4   Period Weeks   Status On-going      PT LONG TERM GOAL #2   Title Pt will participate in 6MWT pain free to improve functional gait activity level   Baseline pain with 3 min walking;  07/03/16:    Time 4   Period Weeks   Status On-going     PT LONG TERM GOAL #3   Title Pt will be I with HEP to manage pain symptoms using flexion activity   Baseline 07/03/16: not consistent with HEP   Time 4   Period Weeks   Status On-going     PT LONG TERM GOAL #4   Title Pt will decreased mODI by at least 13% in order to demonstrate clinically significant reduction in pain   Baseline 07/03/16: 38%   Time 4   Period Weeks   Status New               Plan - 07/23/16 0706    Clinical Impression Statement Focus of session on addressing glute/hip pain and weakness. Issued and had pt perform isometric glute activation and focal STM performed on this region. Following session pt reported incr. pain which is to be expected with focus on this region.   Rehab Potential Fair   Clinical Impairments Affecting Rehab Potential medical comorbidities/motivation   PT Frequency 2x / week   PT Duration 4 weeks   PT Treatment/Interventions ADLs/Self Care Home Management;Aquatic Therapy;Manual techniques;Therapeutic exercise;Functional mobility training;Therapeutic activities;Patient/family education   PT Next Visit Plan Continues with strengthening   PT Home Exercise Plan As prescribed   Consulted and Agree with Plan of Care Patient      Patient will benefit from skilled therapeutic intervention in order to improve the following deficits and impairments:  Pain, Improper body mechanics, Difficulty walking, Hypomobility, Decreased strength, Decreased range of motion  Visit Diagnosis: Difficulty in walking, not elsewhere classified  Chronic midline low back pain with left-sided sciatica     Problem List Patient Active Problem List   Diagnosis Date Noted  . Left-sided low back pain with left-sided sciatica 05/01/2016  . Excessive cerumen in  right ear canal 05/01/2016  . Routine general medical examination at a health care facility 01/27/2016  . Need for hepatitis C screening test 04/21/2015  . History of colonic polyps   . Benign neoplasm of ascending colon   . Benign neoplasm of transverse colon   . Rash and nonspecific skin eruption 08/30/2014  .  Postmenopausal estrogen deficiency 08/30/2014  . Left Achilles tendinitis 04/23/2014  . Chronic diastolic heart failure ([place]) 04/08/2014  . Obesity (BMI 30-39.9) 04/09/2013  . Diabetes mellitus type 2, controlled ([place]) 01/07/2013  . Edema 01/07/2013  . Medicare annual wellness visit, subsequent 11/28/2012  . Chronic hypoxemic respiratory failure ([place]) 11/11/2012  . Obstructive sleep apnea 11/11/2012  . Dyspnea 08/18/2012  . Fatigue 08/18/2012  . Meralgia paresthetica of left side 08/20/2011  . Myalgia 05/03/2011  . Leg pain, lateral 04/12/2011  . GOUT 12/26/2009  . CARDIOMYOPATHY 10/08/2007  . ALLERGIC RHINITIS, SEASONAL 10/08/2007  . CHOLECYSTECTOMY, HX OF 10/08/2007  . HYPERLIPIDEMIA, MIXED 05/02/2007    Betty [name] PT DPT 07/24/2016, 7:13 AM  [facility] [address] [place], [address] Phone: [phone]   Fax:  [phone]  Name: Betty [name] MRN: [id number] Date of Birth: [birth year]

## 2016-07-26 ENCOUNTER — Encounter: Payer: Medicare Other | Admitting: Physical Therapy

## 2016-07-30 ENCOUNTER — Telehealth: Payer: Self-pay | Admitting: Internal Medicine

## 2016-07-30 ENCOUNTER — Encounter: Payer: Self-pay | Admitting: Family Medicine

## 2016-07-30 ENCOUNTER — Ambulatory Visit (INDEPENDENT_AMBULATORY_CARE_PROVIDER_SITE_OTHER): Payer: Medicare Other | Admitting: Family Medicine

## 2016-07-30 ENCOUNTER — Ambulatory Visit: Payer: Medicare Other | Admitting: Internal Medicine

## 2016-07-30 VITALS — BP 116/64 | HR 99 | Temp 98.2°F | Wt 200.8 lb

## 2016-07-30 DIAGNOSIS — E782 Mixed hyperlipidemia: Secondary | ICD-10-CM

## 2016-07-30 DIAGNOSIS — E119 Type 2 diabetes mellitus without complications: Secondary | ICD-10-CM | POA: Diagnosis not present

## 2016-07-30 DIAGNOSIS — M791 Myalgia, unspecified site: Secondary | ICD-10-CM

## 2016-07-30 DIAGNOSIS — R0609 Other forms of dyspnea: Secondary | ICD-10-CM

## 2016-07-30 DIAGNOSIS — I5032 Chronic diastolic (congestive) heart failure: Secondary | ICD-10-CM

## 2016-07-30 LAB — COMPREHENSIVE METABOLIC PANEL
ALT: 21 U/L (ref 0–35)
AST: 20 U/L (ref 0–37)
Albumin: 4.1 g/dL (ref 3.5–5.2)
Alkaline Phosphatase: 80 U/L (ref 39–117)
BILIRUBIN TOTAL: 0.5 mg/dL (ref 0.2–1.2)
BUN: 18 mg/dL (ref 6–23)
CALCIUM: 9.4 mg/dL (ref 8.4–10.5)
CHLORIDE: 100 meq/L (ref 96–112)
CO2: 29 meq/L (ref 19–32)
CREATININE: 0.64 mg/dL (ref 0.40–1.20)
GFR: 97.66 mL/min (ref >60.00)
GLUCOSE: 184 mg/dL — AB (ref 70–99)
Potassium: 4 mEq/L (ref 3.5–5.1)
SODIUM: 140 meq/L (ref 135–145)
Total Protein: 7 g/dL (ref 6.0–8.3)

## 2016-07-30 LAB — CBC
HCT: 40.1 % (ref 36.0–46.0)
Hemoglobin: 13.7 g/dL (ref 12.0–15.0)
MCHC: 34.3 g/dL (ref 30.0–36.0)
MCV: 95.2 fl (ref 78.0–100.0)
PLATELETS: 244 10*3/uL (ref 150.0–400.0)
RBC: 4.21 Mil/uL (ref 3.87–5.11)
RDW: 13 % (ref 11.5–15.5)
WBC: 7.4 10*3/uL (ref 4.0–10.5)

## 2016-07-30 LAB — CK: Total CK: 68 U/L (ref 7–177)

## 2016-07-30 LAB — HEMOGLOBIN A1C: Hgb A1c MFr Bld: 6.8 % — ABNORMAL HIGH (ref 4.6–6.5)

## 2016-07-30 LAB — TSH: TSH: 2.42 u[IU]/mL (ref 0.35–4.50)

## 2016-07-30 LAB — BRAIN NATRIURETIC PEPTIDE: Pro B Natriuretic peptide (BNP): 8 pg/mL (ref 0.0–100.0)

## 2016-07-30 NOTE — Assessment & Plan Note (Signed)
Asymptomatic. Repeat A1c today.

## 2016-07-30 NOTE — Telephone Encounter (Signed)
Betty [name] is calling because she is having some shortness of breath . Please call

## 2016-07-30 NOTE — Patient Instructions (Addendum)
Nice to see you. We are going to check some lab work today and will contact you with the next step in management. We will get you set up with cardiology as well. We will plan on having you start back on your diet in January. If you develop worsening breathing issues, or develop chest pain, swelling, or any new or change in symptoms please seek medical attention immediately.

## 2016-07-30 NOTE — Progress Notes (Signed)
Pre visit review using our clinic review tool, if applicable. No additional management support is needed unless otherwise documented below in the visit note. 

## 2016-07-30 NOTE — Assessment & Plan Note (Signed)
Possibly some myalgias on simvastatin though have been chronic and suspect more likely musculoskeletal cause. We'll check a CK today. She'll continue to monitor. Continue simvastatin for now.

## 2016-07-30 NOTE — Assessment & Plan Note (Addendum)
Patient notes slight worsening to her exertional shortness of breath. I suspect this is related to her chronic diastolic heart failure. Doubt lung pathology given normal lung exam and oxygenation. She has put on some weight which could indicate fluid retention. EKG was performed today and was reassuring. We will obtain lab work as outlined below. She will continue her current medications and once the lab work returns we'll determine if she would benefit from increasing her Lasix. We will get her set up for follow-up with her cardiologist. She is given return precautions.

## 2016-07-30 NOTE — Progress Notes (Signed)
  Betty Rumps, MD Phone: [phone]  Betty [name] is a 70 y.o. female who presents today for f/u.  DIABETES Disease Monitoring: Blood Sugar ranges-not checking Polyuria/phagia/dipsia- no      Diet and exercise controlled. Doing physical therapy currently for her back. Not much other exercise. Was doing well with her diet though wasn't really sustainable as she was doing a diet that eliminated soy , wheat, and had very little sugar and was high in protein. She plans to restart a different diet in January.  HYPERLIPIDEMIA Symptoms Chest pain on exertion:  No   Leg claudication:   No Medications: Compliance- taking simvastatin Right upper quadrant pain- no  Muscle aches- occasionally gets muscle aches in her left thigh that do not resolve with rest and do not always occur with activity.  Heart failure, diastolic: Notes recently she has had slightly more shortness of breath than typical. Notes she can't walk as far as she used to be able to. She notes no orthopnea or PND. She notes no chest pain. She notes no edema. She notes she has put on some weight and can tell around her midsection. She has not seen cardiology in about a year. Continues to take Lasix once a day, Coreg, and losartan.   PMH: nonsmoker.   ROS see history of present illness  Objective  Physical Exam Vitals:   07/30/16 1007  BP: 116/64  Pulse: 99  Temp: 98.2 F (36.8 C)    BP Readings from Last 3 Encounters:  07/30/16 116/64  05/01/16 128/64  03/28/16 106/60   Wt Readings from Last 3 Encounters:  07/30/16 200 lb 12.8 oz (91.1 kg)  05/01/16 194 lb 3.2 oz (88.1 kg)  03/28/16 189 lb 6.4 oz (85.9 kg)    Physical Exam  Constitutional: No distress.  Cardiovascular: Normal rate, regular rhythm and normal heart sounds.   Pulmonary/Chest: Effort normal and breath sounds normal.  Musculoskeletal: She exhibits no edema.  Neurological: She is alert. Gait normal.  Skin: Skin is warm and dry. She is not  diaphoretic.   EKG: Normal sinus rhythm, rate 95, no ST or T-wave changes  Assessment/Plan: Please see individual problem list.  Chronic diastolic heart failure Patient notes slight worsening to her exertional shortness of breath. I suspect this is related to her chronic diastolic heart failure. Doubt lung pathology given normal lung exam and oxygenation. She has put on some weight which could indicate fluid retention. EKG was performed today and was reassuring. We will obtain lab work as outlined below. She will continue her current medications and once the lab work returns we'll determine if she would benefit from increasing her Lasix. We will get her set up for follow-up with her cardiologist. She is given return precautions.  Diabetes mellitus type 2, controlled Asymptomatic. Repeat A1c today.  HYPERLIPIDEMIA, MIXED Possibly some myalgias on simvastatin though have been chronic and suspect more likely musculoskeletal cause. We'll check a CK today. She'll continue to monitor. Continue simvastatin for now.   Orders Placed This Encounter  Procedures  . CBC  . Comp Met (CMET)  . TSH  . B Nat Peptide  . HgB A1c  . CK (Creatine Kinase)  . EKG 12-Lead    Betty Rumps, MD [place]

## 2016-07-31 ENCOUNTER — Ambulatory Visit: Payer: Medicare Other | Admitting: Physical Therapy

## 2016-08-02 ENCOUNTER — Ambulatory Visit: Payer: Medicare Other | Admitting: Physical Therapy

## 2016-08-02 ENCOUNTER — Encounter: Payer: Self-pay | Admitting: Physician Assistant

## 2016-08-02 NOTE — Progress Notes (Addendum)
Cardiology Office Note    Date:  08/03/2016  ID:  Balinda, Resurreccion Mar 07, 1946, MRN [id number] PCP:  Tommi Rumps, MD  Cardiologist:  Dr. Haroldine Laws (CHF clinic), previously Dr. Harrington Challenger   Chief Complaint: shortness of breath  History of Present Illness:  REMEIGH DITSWORTH is a 70 y.o. female with history of NICM with previous EF 25% (improved to normal), restrictive lung physiology, chronic hypoxic respiratory failure on home O2 and CPAP, chronic diastolic CHF, obesity, HL, gout, DM, normal cors by [facility] in 2003, OSA who presents for evaluation of SOB. It has been felt that her dyspnea is related to restrictive lung disease and severe diastolic dysfunction with exertional pulmonary HTN due to elevated left-side pressures with a normal PVR. Therapeutic options have been limited (she was not candidate for selective pulmonary vasodilators with normal PVR).  Per HF note from 06/2015: "She developed increasing shortness of breath over the course of 2 years about 2011 to 2013. She had an extensive Pulmonary work-up at [facility] and has been seen by Dr. Lake Bells. Pulmonary function testing showed restrictive lung disease with a markedly depressed DLCO at 35% predicted. CT scanning of her chest showed upper lobe groundglass abnormalities versus air trapping. A home polysomnogram showed an AHI of 12 and multiple desaturation events to 80% independent of apneas. An abg showed resting hypercarbia (pCO2 52), and a MIP/MEP was 36%/28% predicted. A follow up diaphragm study was normal. A repeat CT scan was performed, no pulmonary embolism was seen and radiology commented on atelectasis but no clear intersitial lung disease. Blood work including an HP panel and serologies for connective tissue diseases has been essentially negative (two partial aspergillus bands on HP panel). Two echocardiograms have not shown evidence of pulmonary hypertension. After starting on CPAP with nasal pillows and oxygen with exertion and sleep  she started feeling better. She had an open lung biopsy at [facility] in November of 2014 which showed small amounts of fibrosis around her pulmonary arteries as well as findings consistent with very mild bronchiolitis.   Resting RHC was normal. She was referred to [facility] (Dr. Gilles Chiquito). RHC with exercise in 3/15 showed marked increase in pulmonary pressures and PCWP with exercise with no change in PVR. O2 sats down to 77% with exercise. Corrected with O2. PA rest 36/20 (26)  PCWP 15 with exercise PA 78/40 (56) PCWP 28.  This was felt to reflect diastolic dysfunction and intrinsic lung disease. Lung bx was not suggestive of clear lung pathology. CT was neg for PE. Continued management of diastolic CHF has been recommended.  Studies:  - RHC (5/14):  RA 9, RV 32/8, PA 30/16/23, PCWP 12, 2.1 WU, CO 5.3, CI 2.7  - Echo (3/14):  EF 55-60%, no RWMA, Gr 1 DD, mild MR  - Nuclear (11/13):  Breast atten, no ischemia, EF 55%; Low Risk   - PFTs. [place] 2014: FVC 1.46 L (48% pred), TLC 2.77 L (55% pred) ERV 0.03 (3% pred), DLCO 8.4 (35% pred)"  Last echo 06/2015 showed EF 50-55%, grade 1 DD. Last labs 07/2016: CK wnl, A1C 6.8, BNP 8, TSH/CBC wnl, CMET ok.  She presents back for evaluation of SOB. She reports that when she last saw Dr.Bensimohn in 06/2015 she had lost 30 lbs and was feeling really good. However, over the course of 2017 she had various orthopedic issues which stunted her weight loss (gout, sciatica) and caused her to lose interest in the healthier diet. Thus she has since gained  18lbs back by comparison. Over the last month she has noticed recurrence of exertional dyspnea. No chest pain, palpitations, syncope, LEE, orthopnea. Blood pressure is low-normal today but she is asymptomatic. Not SOB at rest. No pleuritic discomfort, LEE erythema or tenderness.   Past Medical History:  Diagnosis Date  . Allergic rhinitis    never tested, fall and spring  . Arthritis    hands,knees, feet    . Cataracts, bilateral    not surgical yet  . Chronic diastolic CHF (congestive heart failure) ([facility])   . Chronic respiratory failure ([facility])   . Diabetes mellitus without complication ([place])   . Diverticulosis    problems with frequent gas, burping  . Glucose intolerance (impaired glucose tolerance)   . Gout   . History of chicken pox   . Hyperlipidemia   . Hypertension   . NICM (nonischemic cardiomyopathy) ([place]) 2002   EF 25%; improved to normal - echo 4/08: EF 50-55%, mild MR, mild LAE, mild TR;    cath 3/03: normal cors, EF 40%  . Obesity   . PONV (postoperative nausea and vomiting)   . Restrictive lung disease   . Skin cancer    skin cancers only  . Sleep apnea    cpap settings 4    Past Surgical History:  Procedure Laterality Date  . BILATERAL VATS ABLATION Right    biopsy done 2015- [facility]  . BREAST BIOPSY Left 80's   Cyst removed  . CARDIAC CATHETERIZATION    . choleystectomy  02/2002  . COLONOSCOPY WITH PROPOFOL N/A 12/20/2014   Procedure: COLONOSCOPY WITH PROPOFOL;  Surgeon: [name], MD;  Location: [facility];  Service: Gastroenterology;  Laterality: N/A;  . CYSTECTOMY  1996   l breast  . DILATION AND CURETTAGE OF UTERUS  2011   Dr. Hulan Fray at [facility]  . [place]    . nsvd     x 2  . TUBAL LIGATION  1975  . VAGINAL DELIVERY     x2    Current Medications: Current Outpatient Prescriptions  Medication Sig Dispense Refill  . aspirin 81 MG EC tablet Take 81 mg by mouth daily.     . Calcium Citrate-Vitamin D 200-125 MG-UNIT TABS Take 250 Units by mouth 2 (two) times daily.     . carvedilol (COREG) 12.5 MG tablet TAKE 1 TABLET BY MOUTH TWICE A DAY WITH A MEAL 180 tablet 1  . Cholecalciferol (VITAMIN D-3) 1000 UNITS CAPS Take 1,000 Units by mouth 2 (two) times daily. GUMMIES    . Coenzyme Q10 (CO Q 10) 100 MG CAPS Take 100 mg by mouth at bedtime.     . Fish Oil OIL Take 227 mcg by mouth 2 (two) times daily. GUMMIES    . furosemide (LASIX) 40 MG  tablet Take 1 tablet (40 mg total) by mouth daily. Take extra tablet once daily as needed for weight gain or swelling. 180 tablet 3  . losartan (COZAAR) 50 MG tablet Take 0.5 tablets (25 mg total) by mouth daily. 90 tablet 0  . multivitamin (THERAGRAN) per tablet Take 1 tablet by mouth daily.     . naproxen sodium (ANAPROX) 220 MG tablet Take 220 mg by mouth 2 (two) times daily with a meal.    . polyvinyl alcohol (LIQUIFILM TEARS) 1.4 % ophthalmic solution Place 1 drop into both eyes every morning.    . potassium chloride SA (KLOR-CON M20) 20 MEQ tablet TAKE 2 TABLETS EVERY MORNING AND 1 TABLET EACH EVENING  90 tablet 9  . simvastatin (ZOCOR) 20 MG tablet TAKE 1 TABLET BY MOUTH AT BEDTIME. 90 tablet 3   No current facility-administered medications for this visit.      Allergies:   Amoxicillin; Etodolac; Hctz [hydrochlorothiazide]; Meloxicam; Metformin and related; and Sulfonamide derivatives   Social History   Social History  . Marital status: Married    Spouse name: N/A  . Number of children: 2  . Years of education: N/A   Occupational History  . Retired Pharmacist, hospital Retired  . Works at [employer]  . Smoking status: Never Smoker  . Smokeless tobacco: Never Used  . Alcohol use No  . Drug use: No  . Sexual activity: No   Other Topics Concern  . None   Social History Narrative   Lives in [place] with husband. 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 No pets.     Family History:  The patient's family history includes COPD in her father; Cancer in her maternal grandfather and maternal grandmother; Cancer (age of onset: 65) in her mother; Depression in her sister; Diabetes in her father, paternal grandfather, and sister; Heart attack in her father; Heart disease in her father and paternal grandfather; Hyperlipidemia in her father; Hypertension in her brother and father; Lymphoma in her mother; Meniere's disease in her brother; Pneumonia in  her father; Schizophrenia in her daughter; Stroke in her father.   ROS:   Please see the history of present illness.  All other systems are reviewed and otherwise negative.    PHYSICAL EXAM:   VS:  BP (!) 100/58   Pulse 90   Ht 5\' 5"  (1.651 m)   Wt 199 lb 8 oz (90.5 kg)   BMI 33.20 kg/m   BMI: Body mass index is 33.2 kg/m. Pulse Ox 93%. GEN: Well nourished, well developed WF in no acute distress HEENT: normocephalic, atraumatic Neck: no JVD, carotid bruits, or masses Cardiac:RRR; no murmurs, rubs, or gallops, no edema  Respiratory:  Diffusely diminished bilaterally, no rales, wheezes, or rhonchi, normal work of breathing GI: soft, nontender, nondistended, + BS MS: no deformity or atrophy other than mild kyphosis  Skin: warm and dry, no rash Neuro:  Alert and Oriented x 3, Strength and sensation are intact, follows commands Psych: euthymic mood, full affect  Wt Readings from Last 3 Encounters:  08/03/16 199 lb 8 oz (90.5 kg)  07/30/16 200 lb 12.8 oz (91.1 kg)  05/01/16 194 lb 3.2 oz (88.1 kg)      Studies/Labs Reviewed:   EKG:  EKG was ordered today and personally reviewed by me and demonstrates NSR 90bpm, nonspecific TW changes  Recent Labs: 07/30/2016: ALT 21; BUN 18; Creatinine, Ser 0.64; Hemoglobin 13.7; Platelets 244.0; Potassium 4.0; Pro B Natriuretic peptide (BNP) 8.0; Sodium 140; TSH 2.42   Lipid Panel    Component Value Date/Time   CHOL 180 01/27/2016 1024   TRIG 366.0 (H) 01/27/2016 1024   HDL 67.40 01/27/2016 1024   CHOLHDL 3 01/27/2016 1024   VLDL 73.2 (H) 01/27/2016 1024   LDLCALC 50 01/21/2014 1334   LDLDIRECT 60.0 01/27/2016 1024    Additional studies/ records that were reviewed today include: Summarized above.    ASSESSMENT & PLAN:   1. Shortness of breath - she has noticed recurrence of dyspnea following return of weight and sedentary level of activity. Difficult to know how much is deconditioning, because she also has significant history of  restrictive lung disease and  severe diastolic dysfunction with exertional pulmonary HTN. EKG is without acute changes. She has not tolerated higher doses of diuretic per her report. Does not appear significantly volume overloaded on exam, but lung sounds are diffusely diminished. Recent BNP was totally normal along with other unremarkable labs. Will start with 2D Echocardiogram and 2V CXR to evaluate. I contemplated ischemic evaluation given CT in 2014 demonstrating mention of coronary artery atherosclerosis - would use information from her echocardiogram to help guide this decision. If LVEF is down may need LHC. Note she had normal stress test in 2013 and normal cors in 2013. Will refer back to AHF clinic given her particular situation for more input on management. She has previously not been a candidate for selective pulmonary vasodilators with normal PVR. 2. Chronic diastolic CHF - as above. 3. Restrictive lung physiology with chronic respiratory failure - depending on above workup, may benefit from getting back in to see pulm. 4. Obesity - we reviewed importance of healthy long-term weight loss. She definitely felt better at a lower weight. 5. HTN - low normal BP. No acute symptoms related to this. Continue to monitor.  Disposition: F/u with CHF clinic as above.   Medication Adjustments/Labs and Tests Ordered: Current medicines are reviewed at length with the patient today.  Concerns regarding medicines are outlined above. Medication changes, Labs and Tests ordered today are summarized above and listed in the Patient Instructions accessible in Encounters.   Raechel Ache PA-C  08/03/2016 9:25 AM    [facility] [address], [place], [place]  [address] Phone: [phone]; Fax: [phone]

## 2016-08-03 ENCOUNTER — Encounter: Payer: Self-pay | Admitting: Physician Assistant

## 2016-08-03 ENCOUNTER — Ambulatory Visit (INDEPENDENT_AMBULATORY_CARE_PROVIDER_SITE_OTHER): Payer: Medicare Other | Admitting: Physician Assistant

## 2016-08-03 VITALS — BP 100/58 | HR 90 | Ht 65.0 in | Wt 199.5 lb

## 2016-08-03 DIAGNOSIS — J9611 Chronic respiratory failure with hypoxia: Secondary | ICD-10-CM

## 2016-08-03 DIAGNOSIS — J984 Other disorders of lung: Secondary | ICD-10-CM | POA: Diagnosis not present

## 2016-08-03 DIAGNOSIS — R0602 Shortness of breath: Secondary | ICD-10-CM | POA: Diagnosis not present

## 2016-08-03 DIAGNOSIS — E669 Obesity, unspecified: Secondary | ICD-10-CM

## 2016-08-03 DIAGNOSIS — I5032 Chronic diastolic (congestive) heart failure: Secondary | ICD-10-CM | POA: Diagnosis not present

## 2016-08-03 NOTE — Patient Instructions (Signed)
Medication Instructions:  Your physician recommends that you continue on your current medications as directed. Please refer to the Current Medication list given to you today.   Labwork: None ordered  Testing/Procedures: Your physician has requested that you have an echocardiogram ASAP. Echocardiography is a painless test that uses sound waves to create images of your heart. It provides your doctor with information about the size and shape of your heart and how well your heart's chambers and valves are working. This procedure takes approximately one hour. There are no restrictions for this procedure.  A chest x-ray takes a picture of the organs and structures inside the chest, including the heart, lungs, and blood vessels. This test can show several things, including, whether the heart is enlarges; whether fluid is building up in the lungs; and whether pacemaker / defibrillator leads are still in place.   Follow-Up: Your physician recommends that you schedule a follow-up appointment in: [place] (APP IS OK) AND AFTER ECHO IS DONE   Any Other Special Instructions Will Be Listed Below (If Applicable).  Echocardiogram An echocardiogram, or echocardiography, uses sound waves (ultrasound) to produce an image of your heart. The echocardiogram is simple, painless, obtained within a short period of time, and offers valuable information to your health care provider. The images from an echocardiogram can provide information such as:  Evidence of coronary artery disease (CAD).  Heart size.  Heart muscle function.  Heart valve function.  Aneurysm detection.  Evidence of a past heart attack.  Fluid buildup around the heart.  Heart muscle thickening.  Assess heart valve function. Tell a health care provider about:  Any allergies you have.  All medicines you are taking, including vitamins, herbs, eye drops, creams, and over-the-counter medicines.  Any  problems you or family members have had with anesthetic medicines.  Any blood disorders you have.  Any surgeries you have had.  Any medical conditions you have.  Whether you are pregnant or may be pregnant. What happens before the procedure? No special preparation is needed. Eat and drink normally. What happens during the procedure?  In order to produce an image of your heart, gel will be applied to your chest and a wand-like tool (transducer) will be moved over your chest. The gel will help transmit the sound waves from the transducer. The sound waves will harmlessly bounce off your heart to allow the heart images to be captured in real-time motion. These images will then be recorded.  You may need an IV to receive a medicine that improves the quality of the pictures. What happens after the procedure? You may return to your normal schedule including diet, activities, and medicines, unless your health care provider tells you otherwise. This information is not intended to replace advice given to you by your health care provider. Make sure you discuss any questions you have with your health care provider. Document Released: 08/10/2000 Document Revised: 03/31/2016 Document Reviewed: 04/20/2013 Elsevier Interactive Patient Education  2017 [place] are tests that create pictures of the inside of your body using radiation. Different body parts absorb different amounts of radiation, which show up on the X-ray pictures in shades of black, gray, and white. X-rays are used to look for many health conditions, including broken bones, lung problems, and some types of cancer. Tell a health care provider about:  Any allergies you have.  All medicines you are taking, including vitamins, herbs, eye drops, creams, and over-the-counter  medicines.  Previous surgeries you have had.  Medical conditions you have. What are the risks? Getting an X-ray is a safe procedure. What  happens before the procedure?  Tell the X-ray technician if you are pregnant or might be pregnant.  You may be asked to wear a protective lead apron to hide parts of your body from the X-ray.  You usually will need to undress whatever part of your body needs the X-ray. You will be given a hospital gown to wear, if needed.  You may need to remove your glasses, jewelry, and other metal objects. What happens during the procedure?  The X-ray machine creates a picture by using a tiny burst of radiation. It is painless.  You may need to have several pictures taken at different angles.  You will need to try to be as still as you can during the examination to get the best possible images. What happens after the procedure?  You will be able to resume your normal activities.  The X-ray will be examined by your health care provider or a radiology specialist.  It is your responsibility to get your test results. Ask your health care provider when to expect your results and how to get them. This information is not intended to replace advice given to you by your health care provider. Make sure you discuss any questions you have with your health care provider. Document Released: 08/13/2005 Document Revised: 04/12/2016 Document Reviewed: 10/07/2013 Elsevier Interactive Patient Education  2017 [employer].   If you need a refill on your cardiac medications before your next appointment, please call your pharmacy.

## 2016-08-06 ENCOUNTER — Encounter (HOSPITAL_COMMUNITY): Payer: Self-pay

## 2016-08-06 ENCOUNTER — Ambulatory Visit (HOSPITAL_BASED_OUTPATIENT_CLINIC_OR_DEPARTMENT_OTHER)
Admission: RE | Admit: 2016-08-06 | Discharge: 2016-08-06 | Disposition: A | Discharge status: Home / Self Care | Payer: Medicare Other | Source: Ambulatory Visit | Attending: Cardiology | Admitting: Cardiology

## 2016-08-06 ENCOUNTER — Ambulatory Visit (HOSPITAL_COMMUNITY)
Admission: RE | Admit: 2016-08-06 | Discharge: 2016-08-06 | Disposition: A | Discharge status: Home / Self Care | Payer: Medicare Other | Source: Ambulatory Visit | Attending: Family Medicine | Admitting: Family Medicine

## 2016-08-06 VITALS — BP 138/76 | HR 78 | Wt 197.6 lb

## 2016-08-06 DIAGNOSIS — J9611 Chronic respiratory failure with hypoxia: Secondary | ICD-10-CM | POA: Insufficient documentation

## 2016-08-06 DIAGNOSIS — E119 Type 2 diabetes mellitus without complications: Secondary | ICD-10-CM | POA: Insufficient documentation

## 2016-08-06 DIAGNOSIS — J984 Other disorders of lung: Secondary | ICD-10-CM

## 2016-08-06 DIAGNOSIS — Z880 Allergy status to penicillin: Secondary | ICD-10-CM | POA: Insufficient documentation

## 2016-08-06 DIAGNOSIS — M109 Gout, unspecified: Secondary | ICD-10-CM | POA: Insufficient documentation

## 2016-08-06 DIAGNOSIS — Z79899 Other long term (current) drug therapy: Secondary | ICD-10-CM | POA: Diagnosis not present

## 2016-08-06 DIAGNOSIS — E669 Obesity, unspecified: Secondary | ICD-10-CM | POA: Insufficient documentation

## 2016-08-06 DIAGNOSIS — G4733 Obstructive sleep apnea (adult) (pediatric): Secondary | ICD-10-CM | POA: Diagnosis not present

## 2016-08-06 DIAGNOSIS — Z7982 Long term (current) use of aspirin: Secondary | ICD-10-CM | POA: Insufficient documentation

## 2016-08-06 DIAGNOSIS — I5032 Chronic diastolic (congestive) heart failure: Secondary | ICD-10-CM

## 2016-08-06 DIAGNOSIS — I110 Hypertensive heart disease with heart failure: Secondary | ICD-10-CM | POA: Insufficient documentation

## 2016-08-06 DIAGNOSIS — Z882 Allergy status to sulfonamides status: Secondary | ICD-10-CM | POA: Insufficient documentation

## 2016-08-06 DIAGNOSIS — E785 Hyperlipidemia, unspecified: Secondary | ICD-10-CM | POA: Insufficient documentation

## 2016-08-06 DIAGNOSIS — R0602 Shortness of breath: Secondary | ICD-10-CM

## 2016-08-06 DIAGNOSIS — Z9981 Dependence on supplemental oxygen: Secondary | ICD-10-CM | POA: Diagnosis not present

## 2016-08-06 DIAGNOSIS — I429 Cardiomyopathy, unspecified: Secondary | ICD-10-CM | POA: Diagnosis not present

## 2016-08-06 DIAGNOSIS — R0609 Other forms of dyspnea: Secondary | ICD-10-CM

## 2016-08-06 NOTE — Progress Notes (Signed)
  Echocardiogram 2D Echocardiogram has been performed.  Darlina Sicilian M 08/06/2016, 8:51 AM

## 2016-08-06 NOTE — Progress Notes (Signed)
SATURATION QUALIFICATIONS: (This note is used to comply with regulatory documentation for home oxygen)  Patient Saturations on Room Air at Rest = 95%  Patient Saturations on Room Air while Ambulating = 82%  Patient Saturations on 2 Liters of oxygen while Ambulating = 95%  Please briefly explain why patient needs home oxygen: Patient sats drop with exertion

## 2016-08-06 NOTE — Progress Notes (Signed)
Patient ID: Betty [name], female   DOB: 1946-02-04, 70 y.o.   MRN: [id number]    Advanced Heart Failure Clinic Note   Date:  08/06/2016   ID:  Betty, [name] February 21, 1946, MRN [id number]  PCP:  Tommi Rumps, MD  Cardiologist:  Dr. Dorris Carnes      History of Present Illness: Betty [name] is a 70 y.o. female with a hx of NICM with previous EF 25% (improved to normal), HL, gout, glucose intol, normal cors by [facility] in 2003, OSA. Referred by Dr. Harrington Challenger for further evaluation of exertional dyspnea.   She developed increasing shortness of breath over the course of 2 years about 2011 to 2013. She had an extensive Pulmonary work-up at [facility] and has been seen by Dr. Lake Bells. Pulmonary function testing showed restrictive lung disease with a markedly depressed DLCO at 35% predicted. CT scanning of her chest showed upper lobe groundglass abnormalities versus air trapping. A home polysomnogram showed an AHI of 12 and multiple desaturation events to 80% independent of apneas. An abg showed resting hypercarbia (pCO2 52), and a MIP/MEP was 36%/28% predicted. A follow up diaphragm study was normal. A repeat CT scan was performed, no pulmonary embolism was seen and radiology commented on atelectasis but no clear intersitial lung disease. Blood work including an HP panel and serologies for connective tissue diseases has been essentially negative (two partial aspergillus bands on HP panel). Two echocardiograms have not shown evidence of pulmonary hypertension. After starting on CPAP with nasal pillows and oxygen with exertion and sleep she started feeling better.  She had an open lung biopsy at [facility] in November of 2014 which showed small amounts of fibrosis around her pulmonary arteries as well as findings consistent with very mild bronchiolitis.  Resting RHC was normal 12/2012 Referred to [facility] (Dr. Gilles Chiquito).  RHC with exercise in 3/15 showed marked increase in pulmonary pressures and  PCWP with exercise with no change in PVR. O2 sats down to 77% with exercise. Corrected with O2. PA rest 36/20 (26)  PCWP 15 with exercise PA 78/40 (56) PCWP 28  This was felt to reflect diastolic dysfunction and intrinsic lung disease.  Lung bx was not suggestive of clear lung pathology.  CT was neg for PE.  Continued management of diastolic CHF has been recommended.     She returns today for follow up. Has been more short of breath for the past month.  Has had back trouble over the past 3-4 months.  Has been less active and eating worse. Has gained around 18 lbs since April-March. Uses CPAP and 02 every night. Had SOB walking from front of hospital to clinic. Taking lasix 40 mg daily, has tried taking extra but gets dehydrated. Does not get swollen. Sleeps on 1 pillow, but sleeps on her side. + Bendopnea. SOB with ADLs such as making bed or doing laundry.  Feels very deconditioned from her back pain. No CP or palpitations.   Echo 08/06/16 LVEF 55-60%, Grade 1 DD, Mild TR, PA pressures WNL  Studies:  - RHC (5/14):  RA 9, RV 32/8, PA 30/16/23, PCWP 12, 2.1 WU, CO 5.3, CI 2.7  - Echo (3/14):  EF 55-60%, no RWMA, Gr 1 DD, mild MR  - Nuclear (11/13):  Breast atten, no ischemia, EF 55%; Low Risk   - PFTs. [place] 2014: FVC 1.46 L (48% pred), TLC 2.77 L (55% pred) ERV 0.03 (3% pred), DLCO 8.4 (35% pred)  Recent Labs/Images: 01/27/2016: Direct  LDL 60.0; HDL 67.40 07/30/2016: ALT 21; Creatinine, Ser 0.64; Hemoglobin 13.7; Potassium 4.0; Pro B Natriuretic peptide (BNP) 8.0; TSH 2.42   Wt Readings from Last 3 Encounters:  08/06/16 197 lb 9.6 oz (89.6 kg)  08/03/16 199 lb 8 oz (90.5 kg)  07/30/16 200 lb 12.8 oz (91.1 kg)     Past Medical History:  Diagnosis Date  . Allergic rhinitis    never tested, fall and spring  . Arthritis    hands,knees, feet  . Cataracts, bilateral    not surgical yet  . Chronic diastolic CHF (congestive heart failure) ([place])   . Chronic respiratory failure ([place])   . Diabetes  mellitus without complication (Betty [name])   . Diverticulosis    problems with frequent gas, burping  . Glucose intolerance (impaired glucose tolerance)   . Gout   . History of chicken pox   . Hyperlipidemia   . Hypertension   . NICM (nonischemic cardiomyopathy) ([place]) 2002   EF 25%; improved to normal - echo 4/08: EF 50-55%, mild MR, mild LAE, mild TR;    cath 3/03: normal cors, EF 40%  . Obesity   . PONV (postoperative nausea and vomiting)   . Restrictive lung disease   . Skin cancer    skin cancers only  . Sleep apnea    cpap settings 4    Current Outpatient Prescriptions  Medication Sig Dispense Refill  . aspirin 81 MG EC tablet Take 81 mg by mouth daily.     . Calcium Citrate-Vitamin D 200-125 MG-UNIT TABS Take 250 Units by mouth 2 (two) times daily.     . carvedilol (COREG) 12.5 MG tablet TAKE 1 TABLET BY MOUTH TWICE A DAY WITH A MEAL 180 tablet 1  . Cholecalciferol (VITAMIN D-3) 1000 UNITS CAPS Take 1,000 Units by mouth 2 (two) times daily. GUMMIES    . Coenzyme Q10 (CO Q 10) 100 MG CAPS Take 100 mg by mouth at bedtime.     . Fish Oil OIL Take 227 mcg by mouth 2 (two) times daily. GUMMIES    . furosemide (LASIX) 40 MG tablet Take 1 tablet (40 mg total) by mouth daily. Take extra tablet once daily as needed for weight gain or swelling. 180 tablet 3  . losartan (COZAAR) 50 MG tablet Take 0.5 tablets (25 mg total) by mouth daily. 90 tablet 0  . multivitamin (THERAGRAN) per tablet Take 1 tablet by mouth daily.     . Naproxen Sodium (ALEVE) 220 MG CAPS Take 220 mg by mouth 2 (two) times daily.    . polyvinyl alcohol (LIQUIFILM TEARS) 1.4 % ophthalmic solution Place 1 drop into both eyes every morning.    . potassium chloride SA (KLOR-CON M20) 20 MEQ tablet TAKE 2 TABLETS EVERY MORNING AND 1 TABLET EACH EVENING 90 tablet 9  . simvastatin (ZOCOR) 20 MG tablet TAKE 1 TABLET BY MOUTH AT BEDTIME. 90 tablet 3   No current facility-administered medications for this encounter.    Allergies:    Amoxicillin; Etodolac; Hctz [hydrochlorothiazide]; Meloxicam; Metformin and related; and Sulfonamide derivatives   Social History:  The patient  reports that she has never smoked. She has never used smokeless tobacco. She reports that she does not drink alcohol or use drugs.   Family History:  The patient's family history includes COPD in her father; Cancer in her maternal grandfather and maternal grandmother; Cancer (age of onset: 5) in her mother; Depression in her sister; Diabetes in her father, paternal grandfather, and sister; Heart  attack in her father; Heart disease in her father and paternal grandfather; Hyperlipidemia in her father; Hypertension in her brother and father; Lymphoma in her mother; Meniere's disease in her brother; Pneumonia in her father; Schizophrenia in her daughter; Stroke in her father.   ROS:  Please see the history of present illness.  All other systems reviewed and negative.   PHYSICAL EXAM: VS:  BP 138/76   Pulse 78   Wt 197 lb 9.6 oz (89.6 kg)   SpO2 93%   BMI 32.88 kg/m    General: Pleasant, NAD. No resp difficulty Psych: Normal affect. HEENT: Normal, without mass or lesion. Neck: Supple, no bruits or JVD. Carotids 2+. No lymphadenopathy/thyromegaly appreciated. Heart: PMI nondisplaced. RRR no s3, s4, or murmurs. Lungs: Resp regular and unlabored, CTA. Abdomen: Soft, NT, ND, no HSM. No bruits or masses. +BS  Extremities: No clubbing, cyanosis or edema. DP/PT/Radials 2+ and equal bilaterally. Neuro: Alert and oriented X 3. Moves all extremities spontaneously.   ASSESSMENT AND PLAN:  1. Dyspnea 2. Chronic hypoxic respiratory failure on home O2 and CPAP  3. Chronic diastolic heart failure   4. Restrictive lung physiology 5. Obesity  She has previously had a very thorough cardiopulmonary work up as above. Likely primarily related to restrictive lung disease and severe diastolic dysfunction with exertional pulmonary HTN due to elevated left  sided pressures with a normal PVR.    She is not a candidate for selective pulmonary vasodilators with normal PVR. With her worsening dyspnea as of late, will send to pulmonary. May benefit from repeat PFTs, as has been several years since she has had them performed, or even since pulmonary.   Recommend that she restart exercise as able, notably, to keep her plan to start doing water aerobics next month.   Of note,  02 desaturated to 82-83% after only 50 feet of ambulation. Rapidly improved to > 90% with rest. Stable on 2 L. She has Oxygen at home and we have recommended she wear 02 with exertion. Appropriate documentation located in chart.   Follow up 6 weeks to check on symptoms.  Will discuss case further with MD.   Shirley Friar, PA-C 08/06/2016   Total time spent > 25 minutes. Over half that spent discussing the above.

## 2016-08-06 NOTE — Patient Instructions (Signed)
You have been referred to Pulmonary, they will contact you for appointment  Wear Oxygen with exertion    Follow up in 6-8 weeks

## 2016-08-07 ENCOUNTER — Ambulatory Visit: Payer: Medicare Other | Admitting: Physical Therapy

## 2016-08-09 ENCOUNTER — Ambulatory Visit: Payer: Medicare Other | Admitting: Physical Therapy

## 2016-08-13 ENCOUNTER — Encounter: Payer: Medicare Other | Admitting: Physical Therapy

## 2016-08-16 ENCOUNTER — Encounter: Payer: Medicare Other | Admitting: Physical Therapy

## 2016-08-30 ENCOUNTER — Ambulatory Visit: Payer: Medicare Other | Admitting: Physical Therapy

## 2016-09-04 ENCOUNTER — Ambulatory Visit: Payer: Medicare Other | Attending: Family Medicine | Admitting: Physical Therapy

## 2016-09-04 DIAGNOSIS — M5442 Lumbago with sciatica, left side: Secondary | ICD-10-CM | POA: Insufficient documentation

## 2016-09-04 DIAGNOSIS — G8929 Other chronic pain: Secondary | ICD-10-CM | POA: Insufficient documentation

## 2016-09-04 DIAGNOSIS — R262 Difficulty in walking, not elsewhere classified: Secondary | ICD-10-CM | POA: Diagnosis present

## 2016-09-04 DIAGNOSIS — M6281 Muscle weakness (generalized): Secondary | ICD-10-CM | POA: Insufficient documentation

## 2016-09-04 NOTE — Therapy (Addendum)
[place] MAIN [facility] SERVICES [address] [place], [place], [address] Phone: [phone]   Fax:  [phone]  Physical Therapy Treatment  Patient Details  Name: Betty [name] MRN: [id number] Date of Birth: [birth year] Referring Provider: Caryl Bis  Encounter Date: 09/04/2016      PT End of Session - 09/04/16 1123    Visit Number 11   Number of Visits 17   Date for PT Re-Evaluation 07/31/16   Authorization Type g code 2/10   PT Start Time 1120   PT Stop Time 1153   PT Time Calculation (min) 33 min   Activity Tolerance Patient tolerated treatment well   Behavior During Therapy [facility] for tasks assessed/performed      Past Medical History:  Diagnosis Date  . Allergic rhinitis    never tested, fall and spring  . Arthritis    hands,knees, feet  . Cataracts, bilateral    not surgical yet  . Chronic diastolic CHF (congestive heart failure) ([place])   . Chronic respiratory failure ([place])   . Diabetes mellitus without complication ([place])   . Diverticulosis    problems with frequent gas, burping  . Glucose intolerance (impaired glucose tolerance)   . Gout   . History of chicken pox   . Hyperlipidemia   . Hypertension   . NICM (nonischemic cardiomyopathy) ([place]) 2002   EF 25%; improved to normal - echo 4/08: EF 50-55%, mild MR, mild LAE, mild TR;    cath 3/03: normal cors, EF 40%  . Obesity   . PONV (postoperative nausea and vomiting)   . Restrictive lung disease   . Skin cancer    skin cancers only  . Sleep apnea    cpap settings 4    Past Surgical History:  Procedure Laterality Date  . BILATERAL VATS ABLATION Right    biopsy done 2015- [facility]  . BREAST BIOPSY Left 80's   Cyst removed  . CARDIAC CATHETERIZATION    . choleystectomy  02/2002  . COLONOSCOPY WITH PROPOFOL N/A 12/20/2014   Procedure: COLONOSCOPY WITH PROPOFOL;  Surgeon: [name], MD;  Location: [facility];  Service: Gastroenterology;  Laterality: N/A;  .  CYSTECTOMY  1996   l breast  . DILATION AND CURETTAGE OF UTERUS  2011   Dr. Hulan Fray at [facility]  . [place]    . nsvd     x 2  . TUBAL LIGATION  1975  . VAGINAL DELIVERY     x2    There were no vitals filed for this visit.      Subjective Assessment - 09/04/16 1122    Subjective Pt reports her sciatica ends a midcalf on L LE most of the time but no lnger wakes her up at night.    Pertinent History pain with rolling in bed, laying in bed   How long can you sit comfortably? no problems   How long can you stand comfortably? immediate pain   How long can you walk comfortably? immediate pain   Diagnostic tests MRI - L4-L5 disc herniation   Patient Stated Goals decr. pain, return to exercise                      Adult Aquatic Therapy - 09/04/16 1147      Aquatic Therapy Subjective   Subjective Pt reported a decrease in her LLE sciatic from 5/10 to 2/10 with centralization of pain from  mid calf to SIJ.  O: Pt entered/exited the pool via steps with single UE support on rail.  50 ft =1 lap  Exercises performed in 3'6" depth   Stretches to help with R PSIS-SIJ mobility R LE hip flexor twist on step 2 reps   Mini squats with alignment cues 5 reps  3 way hip extension on R LE, cued for less lumbar extension to decrease pain  4 laps of forward walking with "high thighs" to increase posterior tilt of pelvis , BUE on noodles 4 laps of seated side stepping (noodles under thighs and armpits)   5 ft: minisquat with sidestepping x L and R (2 laps)   Stretches : (Cuing provided for proper alignment) ROM of each joint Hip circles   Relaxation 5' seated on noodles                        PT Long Term Goals - 07/03/16 [id number]      PT LONG TERM GOAL #1   Title pt will be able to stand 20 min with no c/o pain to return to cooking   Baseline pain with 5 min standing; 07/03/16: 10 minutes   Time 4   Period Weeks   Status On-going      PT LONG TERM GOAL #2   Title Pt will participate in 6MWT pain free to improve functional gait activity level   Baseline pain with 3 min walking;  07/03/16:    Time 4   Period Weeks   Status On-going     PT LONG TERM GOAL #3   Title Pt will be I with HEP to manage pain symptoms using flexion activity   Baseline 07/03/16: not consistent with HEP   Time 4   Period Weeks   Status On-going     PT LONG TERM GOAL #4   Title Pt will decreased mODI by at least 13% in order to demonstrate clinically significant reduction in pain   Baseline 07/03/16: 38%   Time 4   Period Weeks   Status New               Plan - 09/04/16 1134    Clinical Impression Statement Pt is progressing well towards her goals. She started aquatic PT today which she tolerated with improvement in her sciatic pain in LLE. Pain centralized from midcalf to SIJ with customized exercises to faciliate increase arthrokinematic movement in SIJ. Pt's pain decreased from 5/10 to 2/10. Pt appeared to have a more symmetrically aligned R PSIS to L post session. Pt continues to benefit from skilled PT.    Rehab Potential Fair   Clinical Impairments Affecting Rehab Potential medical comorbidities/motivation   PT Frequency 2x / week   PT Duration 4 weeks   PT Treatment/Interventions ADLs/Self Care Home Management;Aquatic Therapy;Manual techniques;Therapeutic exercise;Functional mobility training;Therapeutic activities;Patient/family education   PT Next Visit Plan Continues with strengthening   PT Home Exercise Plan As prescribed   Consulted and Agree with Plan of Care Patient      Patient will benefit from skilled therapeutic intervention in order to improve the following deficits and impairments:  Pain, Improper body mechanics, Difficulty walking, Hypomobility, Decreased strength, Decreased range of motion  Visit Diagnosis: Difficulty in walking, not elsewhere classified  Chronic midline low back pain with left-sided  sciatica  Muscle weakness (generalized)     Problem List Patient Active Problem List   Diagnosis Date Noted  . Restrictive lung disease 08/06/2016  . Left-sided low back pain  with left-sided sciatica 05/01/2016  . Excessive cerumen in right ear canal 05/01/2016  . Routine general medical examination at a health care facility 01/27/2016  . Need for hepatitis C screening test 04/21/2015  . History of colonic polyps   . Benign neoplasm of ascending colon   . Benign neoplasm of transverse colon   . Rash and nonspecific skin eruption 08/30/2014  . Postmenopausal estrogen deficiency 08/30/2014  . Left Achilles tendinitis 04/23/2014  . Chronic diastolic heart failure ([place]) 04/08/2014  . Obesity (BMI 30-39.9) 04/09/2013  . Diabetes mellitus type 2, controlled ([place]) 01/07/2013  . Edema 01/07/2013  . Medicare annual wellness visit, subsequent 11/28/2012  . Chronic hypoxemic respiratory failure ([place]) 11/11/2012  . Obstructive sleep apnea 11/11/2012  . Dyspnea 08/18/2012  . Fatigue 08/18/2012  . Meralgia paresthetica of left side 08/20/2011  . Myalgia 05/03/2011  . Leg pain, lateral 04/12/2011  . GOUT 12/26/2009  . CARDIOMYOPATHY 10/08/2007  . ALLERGIC RHINITIS, SEASONAL 10/08/2007  . CHOLECYSTECTOMY, HX OF 10/08/2007  . HYPERLIPIDEMIA, MIXED 05/02/2007    Jerl Mina ,PT, DPT, E-RYT  09/04/2016, 11:50 AM  [place] MAIN [facility] SERVICES [address] [place], [place], [address] Phone: [phone]   Fax:  [phone]  Name: SUNIYA HAUGHEY MRN: [id number] Date of Birth: 11-13-1945

## 2016-09-06 ENCOUNTER — Ambulatory Visit: Payer: Medicare Other | Admitting: Physical Therapy

## 2016-09-06 DIAGNOSIS — R262 Difficulty in walking, not elsewhere classified: Secondary | ICD-10-CM

## 2016-09-06 DIAGNOSIS — M6281 Muscle weakness (generalized): Secondary | ICD-10-CM

## 2016-09-06 DIAGNOSIS — G8929 Other chronic pain: Secondary | ICD-10-CM

## 2016-09-06 DIAGNOSIS — M5442 Lumbago with sciatica, left side: Secondary | ICD-10-CM

## 2016-09-07 NOTE — Therapy (Addendum)
[place] MAIN [facility] SERVICES [address] [place], [place], [address] Phone: [phone]   Fax:  [phone]  Physical Therapy Treatment  Patient Details  Name: Betty [name] MRN: [id number] Date of Birth: 1945-11-10 Referring Provider: Caryl Bis  Encounter Date: 09/06/2016      PT End of Session - 09/07/16 1741    Visit Number 12   Number of Visits 17   Date for PT Re-Evaluation 07/31/16   Authorization Type g code 3/10   PT Start Time 1120   PT Stop Time 1205   PT Time Calculation (min) 45 min   Activity Tolerance Patient tolerated treatment well   Behavior During Therapy [facility] for tasks assessed/performed      Past Medical History:  Diagnosis Date  . Allergic rhinitis    never tested, fall and spring  . Arthritis    hands,knees, feet  . Cataracts, bilateral    not surgical yet  . Chronic diastolic CHF (congestive heart failure) ([place])   . Chronic respiratory failure ([place])   . Diabetes mellitus without complication ([place])   . Diverticulosis    problems with frequent gas, burping  . Glucose intolerance (impaired glucose tolerance)   . Gout   . History of chicken pox   . Hyperlipidemia   . Hypertension   . NICM (nonischemic cardiomyopathy) ([place]) 2002   EF 25%; improved to normal - echo 4/08: EF 50-55%, mild MR, mild LAE, mild TR;    cath 3/03: normal cors, EF 40%  . Obesity   . PONV (postoperative nausea and vomiting)   . Restrictive lung disease   . Skin cancer    skin cancers only  . Sleep apnea    cpap settings 4    Past Surgical History:  Procedure Laterality Date  . BILATERAL VATS ABLATION Right    biopsy done 2015- [facility]  . BREAST BIOPSY Left 80's   Cyst removed  . CARDIAC CATHETERIZATION    . choleystectomy  02/2002  . COLONOSCOPY WITH PROPOFOL N/A 12/20/2014   Procedure: COLONOSCOPY WITH PROPOFOL;  Surgeon: [name], MD;  Location: [facility];  Service: Gastroenterology;  Laterality: N/A;  .  CYSTECTOMY  1996   l breast  . DILATION AND CURETTAGE OF UTERUS  2011   Dr. Hulan Fray at [facility]  . [place]    . nsvd     x 2  . TUBAL LIGATION  1975  . VAGINAL DELIVERY     x2    There were no vitals filed for this visit.      Subjective Assessment - 09/07/16 1740    Subjective Pt has not had radiating pain since last session. Pain is now located at L buttock   Pertinent History pain with rolling in bed, laying in bed   How long can you sit comfortably? no problems   How long can you stand comfortably? immediate pain   How long can you walk comfortably? immediate pain   Diagnostic tests MRI - L4-L5 disc herniation   Patient Stated Goals decr. pain, return to exercise                      Adult Aquatic Therapy - 09/07/16 1740      Aquatic Therapy Subjective   Subjective Pt had no complaints during session       O: Pt entered/exited the pool via steps with single UE support on rail.  50 ft =1 lap  Stretches : (Cuing provided for proper alignment) ROM of each joint wall stretch Mini squat-figure 4 (piriformis) hip flexor stretch on step hip flexor twist on step   Exercises performed in 3'6" depth   6 laps walking forward with BUE on noodles 6 laps sidestepping    sweeping education to use sidestepping technique   10 reps mini squats  Stretches   Relaxation on noodles under head, armpits, midback, posterior thighs pulled by PT across 2 laps.   Guided pt on transitioning to stand with minimal strain on neck and back.                    PT Long Term Goals - 07/03/16 [id number]      PT LONG TERM GOAL #1   Title pt will be able to stand 20 min with no c/o pain to return to cooking   Baseline pain with 5 min standing; 07/03/16: 10 minutes   Time 4   Period Weeks   Status On-going     PT LONG TERM GOAL #2   Title Pt will participate in 6MWT pain free to improve functional gait activity level   Baseline pain with 3 min  walking;  07/03/16:    Time 4   Period Weeks   Status On-going     PT LONG TERM GOAL #3   Title Pt will be I with HEP to manage pain symptoms using flexion activity   Baseline 07/03/16: not consistent with HEP   Time 4   Period Weeks   Status On-going     PT LONG TERM GOAL #4   Title Pt will decreased mODI by at least 13% in order to demonstrate clinically significant reduction in pain   Baseline 07/03/16: 38%   Time 4   Period Weeks   Status New               Plan - 09/07/16 1741    Clinical Impression Statement Pt responded well to customized exercise from last session as she reported no more radiating pain. Pt did not notice the remaining pain located at her L buttock following today's session. Pt will continue to benefit skilled PT.    Rehab Potential Fair   Clinical Impairments Affecting Rehab Potential medical comorbidities/motivation   PT Frequency 2x / week   PT Duration 4 weeks   PT Treatment/Interventions ADLs/Self Care Home Management;Aquatic Therapy;Manual techniques;Therapeutic exercise;Functional mobility training;Therapeutic activities;Patient/family education   PT Next Visit Plan Continues with strengthening   PT Home Exercise Plan As prescribed   Consulted and Agree with Plan of Care Patient      Patient will benefit from skilled therapeutic intervention in order to improve the following deficits and impairments:  Pain, Improper body mechanics, Difficulty walking, Hypomobility, Decreased strength, Decreased range of motion  Visit Diagnosis: Difficulty in walking, not elsewhere classified  Chronic midline low back pain with left-sided sciatica  Muscle weakness (generalized)     Problem List Patient Active Problem List   Diagnosis Date Noted  . Restrictive lung disease 08/06/2016  . Left-sided low back pain with left-sided sciatica 05/01/2016  . Excessive cerumen in right ear canal 05/01/2016  . Routine general medical examination at a health  care facility 01/27/2016  . Need for hepatitis C screening test 04/21/2015  . History of colonic polyps   . Benign neoplasm of ascending colon   . Benign neoplasm of transverse colon   . Rash and nonspecific skin eruption 08/30/2014  . Postmenopausal estrogen  deficiency 08/30/2014  . Left Achilles tendinitis 04/23/2014  . Chronic diastolic heart failure ([place]) 04/08/2014  . Obesity (BMI 30-39.9) 04/09/2013  . Diabetes mellitus type 2, controlled ([place]) 01/07/2013  . Edema 01/07/2013  . Medicare annual wellness visit, subsequent 11/28/2012  . Chronic hypoxemic respiratory failure ([place]) 11/11/2012  . Obstructive sleep apnea 11/11/2012  . Dyspnea 08/18/2012  . Fatigue 08/18/2012  . Meralgia paresthetica of left side 08/20/2011  . Myalgia 05/03/2011  . Leg pain, lateral 04/12/2011  . GOUT 12/26/2009  . CARDIOMYOPATHY 10/08/2007  . ALLERGIC RHINITIS, SEASONAL 10/08/2007  . CHOLECYSTECTOMY, HX OF 10/08/2007  . HYPERLIPIDEMIA, MIXED 05/02/2007    Jerl Mina ,PT, DPT, E-RYT  09/07/2016, 5:43 PM  [place] MAIN [facility] SERVICES [address] [place], [place], [address] Phone: [phone]   Fax:  [phone]  Name: Betty [name] MRN: [id number] Date of Birth: [birth year]

## 2016-09-11 ENCOUNTER — Ambulatory Visit: Payer: Medicare Other | Admitting: Physical Therapy

## 2016-09-11 DIAGNOSIS — R262 Difficulty in walking, not elsewhere classified: Secondary | ICD-10-CM | POA: Diagnosis not present

## 2016-09-11 DIAGNOSIS — G8929 Other chronic pain: Secondary | ICD-10-CM

## 2016-09-11 DIAGNOSIS — M5442 Lumbago with sciatica, left side: Secondary | ICD-10-CM

## 2016-09-11 DIAGNOSIS — M6281 Muscle weakness (generalized): Secondary | ICD-10-CM

## 2016-09-12 ENCOUNTER — Encounter: Payer: Medicare Other | Admitting: Physical Therapy

## 2016-09-12 NOTE — Therapy (Signed)
[place] MAIN [facility] SERVICES [address] [place], [place], [address] Phone: [phone]   Fax:  [phone]  Physical Therapy Treatment  Patient Details  Name: Betty [name] MRN: [id number] Date of Birth: 03/27/1946 Referring Provider: Caryl Bis  Encounter Date: 09/11/2016      PT End of Session - 09/12/16 [id number]    Visit Number 13   Number of Visits 17   Date for PT Re-Evaluation 07/31/16   Authorization Type g code 4/10   PT Start Time 1115   PT Stop Time 1155   PT Time Calculation (min) 40 min   Activity Tolerance Patient tolerated treatment well   Behavior During Therapy [facility] for tasks assessed/performed      Past Medical History:  Diagnosis Date  . Allergic rhinitis    never tested, fall and spring  . Arthritis    hands,knees, feet  . Cataracts, bilateral    not surgical yet  . Chronic diastolic CHF (congestive heart failure) ([place])   . Chronic respiratory failure ([place])   . Diabetes mellitus without complication ([place])   . Diverticulosis    problems with frequent gas, burping  . Glucose intolerance (impaired glucose tolerance)   . Gout   . History of chicken pox   . Hyperlipidemia   . Hypertension   . NICM (nonischemic cardiomyopathy) ([place]) 2002   EF 25%; improved to normal - echo 4/08: EF 50-55%, mild MR, mild LAE, mild TR;    cath 3/03: normal cors, EF 40%  . Obesity   . PONV (postoperative nausea and vomiting)   . Restrictive lung disease   . Skin cancer    skin cancers only  . Sleep apnea    cpap settings 4    Past Surgical History:  Procedure Laterality Date  . BILATERAL VATS ABLATION Right    biopsy done 2015- [facility]  . BREAST BIOPSY Left 80's   Cyst removed  . CARDIAC CATHETERIZATION    . choleystectomy  02/2002  . COLONOSCOPY WITH PROPOFOL N/A 12/20/2014   Procedure: COLONOSCOPY WITH PROPOFOL;  Surgeon: [name], MD;  Location: [facility];  Service: Gastroenterology;  Laterality: N/A;  .  CYSTECTOMY  1996   l breast  . DILATION AND CURETTAGE OF UTERUS  2011   Dr. Hulan Fray at [facility]  . [place]    . nsvd     x 2  . TUBAL LIGATION  1975  . VAGINAL DELIVERY     x2    There were no vitals filed for this visit.      Subjective Assessment - 09/12/16 1509    Subjective Pt reports the relief she felt following last session was temporary. She has had intermittent radiating pain down to mid-calf and today it is at the mid posterior thigh. Constant pain still persists at L buttock.    Pertinent History pain with rolling in bed, laying in bed   How long can you sit comfortably? no problems   How long can you stand comfortably? immediate pain   How long can you walk comfortably? immediate pain   Diagnostic tests MRI - L4-L5 disc herniation   Patient Stated Goals decr. pain, return to exercise            O: Pt entered/exited the pool via steps with single UE support on rail.  50 ft =1 lap  Exercises performed in 3'6" depth   Stretches : (Cuing provided for proper alignment) ROM of each joint Hip  circles  B LE hip flexor twist on step 2 reps    mini squats with alignment   3 way hip extension on R LE, cued for less lumbar extension to decrease pain  4 laps of forward walking with "high thighs" to increase posterior tilt of pelvis , BUE on noodles 4 laps of seated side stepping + mini squat  BUE on noodles 4 laps walking backwards + mini squat    Relaxation 5' seated on noodles                                              PT Long Term Goals - 07/03/16 [id number]      PT LONG TERM GOAL #1   Title pt will be able to stand 20 min with no c/o pain to return to cooking   Baseline pain with 5 min standing; 07/03/16: 10 minutes   Time 4   Period Weeks   Status On-going     PT LONG TERM GOAL #2   Title Pt will participate in 6MWT pain free to improve functional gait activity level    Baseline pain with 3 min walking;  07/03/16:    Time 4   Period Weeks   Status On-going     PT LONG TERM GOAL #3   Title Pt will be I with HEP to manage pain symptoms using flexion activity   Baseline 07/03/16: not consistent with HEP   Time 4   Period Weeks   Status On-going     PT LONG TERM GOAL #4   Title Pt will decreased mODI by at least 13% in order to demonstrate clinically significant reduction in pain   Baseline 07/03/16: 38%   Time 4   Period Weeks   Status New               Plan - 09/12/16 1515    Clinical Impression Statement Pt's radiating pain returned during the 2 days following last visit despite pt reporting no pain at the end of the last session. However, the pain has shown signs of centralization to the midcalf level and today at the posterior mid thigh level which is a positive sign of improvement. Pt tolerated aquatic session today with increased endurance with report of no pain except at the L buttock which has remained a constant level of pain. Pt will benefit from manual Tx on land to address sacroiliac dysfunction in order to address her remaining pain. Current aquatic PT  has communicated with the PT who evaluated pt re: scheduling pt to receive land Tx to receive pelvic health PT approaches and he provided a verbal agreement. Current aquatic PT is also a pelvic health PT and will work to schedule pt land appts to help pt continue advancing towards her goals.       Rehab Potential Fair   Clinical Impairments Affecting Rehab Potential medical comorbidities/motivation   PT Frequency 2x / week   PT Duration 4 weeks   PT Treatment/Interventions ADLs/Self Care Home Management;Aquatic Therapy;Manual techniques;Therapeutic exercise;Functional mobility training;Therapeutic activities;Patient/family education   PT Next Visit Plan Continues with strengthening   PT Home Exercise Plan As prescribed   Consulted and Agree with Plan of Care Patient      Patient will  benefit from skilled therapeutic intervention in order to improve the following deficits and impairments:  Pain, Improper body  mechanics, Difficulty walking, Hypomobility, Decreased strength, Decreased range of motion  Visit Diagnosis: Difficulty in walking, not elsewhere classified  Chronic midline low back pain with left-sided sciatica  Muscle weakness (generalized)     Problem List Patient Active Problem List   Diagnosis Date Noted  . Restrictive lung disease 08/06/2016  . Left-sided low back pain with left-sided sciatica 05/01/2016  . Excessive cerumen in right ear canal 05/01/2016  . Routine general medical examination at a health care facility 01/27/2016  . Need for hepatitis C screening test 04/21/2015  . History of colonic polyps   . Benign neoplasm of ascending colon   . Benign neoplasm of transverse colon   . Rash and nonspecific skin eruption 08/30/2014  . Postmenopausal estrogen deficiency 08/30/2014  . Left Achilles tendinitis 04/23/2014  . Chronic diastolic heart failure ([place]) 04/08/2014  . Obesity (BMI 30-39.9) 04/09/2013  . Diabetes mellitus type 2, controlled ([place]) 01/07/2013  . Edema 01/07/2013  . Medicare annual wellness visit, subsequent 11/28/2012  . Chronic hypoxemic respiratory failure ([place]) 11/11/2012  . Obstructive sleep apnea 11/11/2012  . Dyspnea 08/18/2012  . Fatigue 08/18/2012  . Meralgia paresthetica of left side 08/20/2011  . Myalgia 05/03/2011  . Leg pain, lateral 04/12/2011  . GOUT 12/26/2009  . CARDIOMYOPATHY 10/08/2007  . ALLERGIC RHINITIS, SEASONAL 10/08/2007  . CHOLECYSTECTOMY, HX OF 10/08/2007  . HYPERLIPIDEMIA, MIXED 05/02/2007    Jerl Mina ,PT, DPT, E-RYT t 09/12/2016, 3:23 PM  [place] MAIN [facility] SERVICES [address] [place], [place], [address] Phone: [phone]   Fax:  [phone]  Name: Betty [name] MRN: [id number] Date of Birth: [birth year]

## 2016-09-12 NOTE — Addendum Note (Signed)
Addended by: Jerl Mina on: 09/12/2016 03:36 PM   Modules accepted: Orders

## 2016-09-13 ENCOUNTER — Ambulatory Visit: Payer: Medicare Other | Admitting: Physical Therapy

## 2016-09-18 ENCOUNTER — Ambulatory Visit: Payer: Medicare Other | Admitting: Physical Therapy

## 2016-09-18 DIAGNOSIS — G8929 Other chronic pain: Secondary | ICD-10-CM

## 2016-09-18 DIAGNOSIS — R262 Difficulty in walking, not elsewhere classified: Secondary | ICD-10-CM

## 2016-09-18 DIAGNOSIS — M6281 Muscle weakness (generalized): Secondary | ICD-10-CM

## 2016-09-18 DIAGNOSIS — M5442 Lumbago with sciatica, left side: Secondary | ICD-10-CM

## 2016-09-18 NOTE — Therapy (Signed)
[place] MAIN [facility] SERVICES [address] [place], [place], [address] Phone: [phone]   Fax:  [phone]  Physical Therapy Treatment  Patient Details  Name: Betty [name] MRN: [id number] Date of Birth: 1946-04-26 Referring Provider: Caryl Bis  Encounter Date: 09/18/2016      PT End of Session - 09/18/16 1202    Visit Number 14   Number of Visits 17   Date for PT Re-Evaluation 12/05/16   Authorization Type g code 4/10   PT Start Time 1115   PT Stop Time 1200   PT Time Calculation (min) 45 min      Past Medical History:  Diagnosis Date  . Allergic rhinitis    never tested, fall and spring  . Arthritis    hands,knees, feet  . Cataracts, bilateral    not surgical yet  . Chronic diastolic CHF (congestive heart failure) ([place])   . Chronic respiratory failure ([place])   . Diabetes mellitus without complication ([place])   . Diverticulosis    problems with frequent gas, burping  . Glucose intolerance (impaired glucose tolerance)   . Gout   . History of chicken pox   . Hyperlipidemia   . Hypertension   . NICM (nonischemic cardiomyopathy) ([place]) 2002   EF 25%; improved to normal - echo 4/08: EF 50-55%, mild MR, mild LAE, mild TR;    cath 3/03: normal cors, EF 40%  . Obesity   . PONV (postoperative nausea and vomiting)   . Restrictive lung disease   . Skin cancer    skin cancers only  . Sleep apnea    cpap settings 4    Past Surgical History:  Procedure Laterality Date  . BILATERAL VATS ABLATION Right    biopsy done 2015- [facility]  . BREAST BIOPSY Left 80's   Cyst removed  . CARDIAC CATHETERIZATION    . choleystectomy  02/2002  . COLONOSCOPY WITH PROPOFOL N/A 12/20/2014   Procedure: COLONOSCOPY WITH PROPOFOL;  Surgeon: [name], MD;  Location: [facility];  Service: Gastroenterology;  Laterality: N/A;  . CYSTECTOMY  1996   l breast  . DILATION AND CURETTAGE OF UTERUS  2011   Dr. Hulan Fray at [facility]  . [place]    . nsvd     x 2  . TUBAL LIGATION  1975  . VAGINAL DELIVERY     x2    There were no vitals filed for this visit.      Subjective Assessment - 09/18/16 1154    Subjective Pt report her radiating pain now occurs to knee level  and not at the calf level.  Today, she feels it at the mid thigh level. 4/10. Pt has been performing her stretches   Pertinent History pain with rolling in bed, laying in bed   How long can you sit comfortably? no problems   How long can you stand comfortably? immediate pain   How long can you walk comfortably? immediate pain   Diagnostic tests MRI - L4-L5 disc herniation   Patient Stated Goals decr. pain, return to exercise                      Adult Aquatic Therapy - 09/18/16 1200      Aquatic Therapy Subjective   Subjective after sidestepping/ mini squat exercises , pt reported noticing tightness in her L hip, following stretches, the area loosened up. No other complaints      O: Pt entered/exited  the pool via steps with single UE support on rail.  50 ft =1 lap  Exercises performed in 3'6" depth   Stretches : (Cuing provided for proper alignment) ROM of each joint Mini squat-figure 4 (piriformis) hip flexor stretch on step hip flexor twist on step  4 laps sidestepping with mini squat ( cued for knee behind toes in squat)   Stretches : helped to decrease the L buttock pain following 4 laps above   Mini squat-figure 4 (piriformis) hip flexor stretch on step hip flexor twist on step    2 laps marching, high thighs ( for posterior tilt) with noodle behind back to increase lower trap activation  2 laps marching, high tights  (posterior tight) with noodle in front, moving noodle as in chest rows to increase scap retraction, depression  Stretches : helped to decrease the L buttock pain following 4 laps above  Mini squat-figure 4 (piriformis) hip flexor stretch on step hip flexor twist on step  Added pelvic circles,  posterior.anterior tilt, lateral hip hike B. Cued for less ROM, isolation of pelvic girdle without lumbar  Movement                   PT Long Term Goals - 07/03/16 [id number]      PT LONG TERM GOAL #1   Title pt will be able to stand 20 min with no c/o pain to return to cooking   Baseline pain with 5 min standing; 07/03/16: 10 minutes   Time 4   Period Weeks   Status On-going     PT LONG TERM GOAL #2   Title Pt will participate in 6MWT pain free to improve functional gait activity level   Baseline pain with 3 min walking;  07/03/16:    Time 4   Period Weeks   Status On-going     PT LONG TERM GOAL #3   Title Pt will be I with HEP to manage pain symptoms using flexion activity   Baseline 07/03/16: not consistent with HEP   Time 4   Period Weeks   Status On-going     PT LONG TERM GOAL #4   Title Pt will decreased mODI by at least 13% in order to demonstrate clinically significant reduction in pain   Baseline 07/03/16: 38%   Time 4   Period Weeks   Status New               Plan - 09/18/16 1202    Clinical Impression Statement Pt continues to have positive response to aquatic Tx the past visits with centralization of pain from mid-calf to mid-posterior thigh. Today, pain intensity decreased 4/10 to 2/10 but relief remain temporary after each pool visit. Pelvic Health PT will be treating pt on land with manual assessment and Tx as pt's remaining issues appear to follow the referral pattern of obturator internus. Today pt demo'd correct form with pelvic mobility and proprioception exercises with cuing. Pt voiced understanding of HEP for loosening up the L posterior buttock / SIJ area. Initiated thoracolumbar strengthening today. Pt continues to benefit from skilled PT.    Rehab Potential Fair   Clinical Impairments Affecting Rehab Potential medical comorbidities/motivation   PT Frequency 2x / week   PT Duration 4 weeks   PT Treatment/Interventions ADLs/Self Care Home  Management;Aquatic Therapy;Manual techniques;Therapeutic exercise;Functional mobility training;Therapeutic activities;Patient/family education   PT Next Visit Plan Continues with strengthening   PT Home Exercise Plan As prescribed   Consulted and Agree with  Plan of Care Patient      Patient will benefit from skilled therapeutic intervention in order to improve the following deficits and impairments:  Pain, Improper body mechanics, Difficulty walking, Hypomobility, Decreased strength, Decreased range of motion  Visit Diagnosis: Difficulty in walking, not elsewhere classified  Chronic midline low back pain with left-sided sciatica  Muscle weakness (generalized)     Problem List Patient Active Problem List   Diagnosis Date Noted  . Restrictive lung disease 08/06/2016  . Left-sided low back pain with left-sided sciatica 05/01/2016  . Excessive cerumen in right ear canal 05/01/2016  . Routine general medical examination at a health care facility 01/27/2016  . Need for hepatitis C screening test 04/21/2015  . History of colonic polyps   . Benign neoplasm of ascending colon   . Benign neoplasm of transverse colon   . Rash and nonspecific skin eruption 08/30/2014  . Postmenopausal estrogen deficiency 08/30/2014  . Left Achilles tendinitis 04/23/2014  . Chronic diastolic heart failure ([place]) 04/08/2014  . Obesity (BMI 30-39.9) 04/09/2013  . Diabetes mellitus type 2, controlled ([place]) 01/07/2013  . Edema 01/07/2013  . Medicare annual wellness visit, subsequent 11/28/2012  . Chronic hypoxemic respiratory failure ([place]) 11/11/2012  . Obstructive sleep apnea 11/11/2012  . Dyspnea 08/18/2012  . Fatigue 08/18/2012  . Meralgia paresthetica of left side 08/20/2011  . Myalgia 05/03/2011  . Leg pain, lateral 04/12/2011  . GOUT 12/26/2009  . CARDIOMYOPATHY 10/08/2007  . ALLERGIC RHINITIS, SEASONAL 10/08/2007  . CHOLECYSTECTOMY, HX OF 10/08/2007  . HYPERLIPIDEMIA, MIXED 05/02/2007     Jerl Mina ,PT, DPT, E-RYT  09/18/2016, 12:07 PM  [place] MAIN [facility] SERVICES [address] [place], [place], [address] Phone: [phone]   Fax:  [phone]  Name: Betty [name] MRN: [id number] Date of Birth: 13-Aug-1946

## 2016-09-20 ENCOUNTER — Ambulatory Visit: Payer: Medicare Other | Admitting: Physical Therapy

## 2016-09-20 ENCOUNTER — Encounter: Payer: Self-pay | Admitting: Physical Therapy

## 2016-09-20 DIAGNOSIS — R262 Difficulty in walking, not elsewhere classified: Secondary | ICD-10-CM

## 2016-09-21 ENCOUNTER — Ambulatory Visit (INDEPENDENT_AMBULATORY_CARE_PROVIDER_SITE_OTHER): Payer: Medicare Other | Admitting: Pulmonary Disease

## 2016-09-21 ENCOUNTER — Encounter: Payer: Self-pay | Admitting: Pulmonary Disease

## 2016-09-21 ENCOUNTER — Ambulatory Visit (INDEPENDENT_AMBULATORY_CARE_PROVIDER_SITE_OTHER)
Admission: RE | Admit: 2016-09-21 | Discharge: 2016-09-21 | Disposition: A | Discharge status: Home / Self Care | Payer: Medicare Other | Source: Ambulatory Visit | Attending: Pulmonary Disease | Admitting: Pulmonary Disease

## 2016-09-21 VITALS — BP 122/62 | HR 74 | Ht 65.0 in | Wt 192.0 lb

## 2016-09-21 DIAGNOSIS — J9611 Chronic respiratory failure with hypoxia: Secondary | ICD-10-CM

## 2016-09-21 DIAGNOSIS — J984 Other disorders of lung: Secondary | ICD-10-CM | POA: Diagnosis not present

## 2016-09-21 DIAGNOSIS — R0600 Dyspnea, unspecified: Secondary | ICD-10-CM | POA: Diagnosis not present

## 2016-09-21 DIAGNOSIS — G4733 Obstructive sleep apnea (adult) (pediatric): Secondary | ICD-10-CM

## 2016-09-21 DIAGNOSIS — I5032 Chronic diastolic (congestive) heart failure: Secondary | ICD-10-CM

## 2016-09-21 NOTE — Assessment & Plan Note (Signed)
Continue CPAP daily at bedtime with oxygen. Check overnight except her with these. If she continues to have symptoms of fatigue despite weight loss then repeat CPAP titration study.

## 2016-09-21 NOTE — Assessment & Plan Note (Signed)
She has increasing dyspnea in the last several months associated with a 20 pound weight gain.  As demonstrated from all the records in our system I put her through a fairly extensive workup back in 2014 2 2015 including many different studies including an open lung biopsy, heart catheterizations, and multiple imaging studies. Ultimately we came up with a diagnosis of restrictive lung disease of uncertain etiology, some degree of peribronchiolar fibrosis/bronchiolitis, mild pulmonary arteriopathy change, obstructive sleep apnea, and diastolic heart failure.  I've never really had convincing evidence that she has significant pulmonary parenchymal pathology. For whatever reason whenever she gains weight she tends to drop her oxygen saturation more, I think this has something to do with her anatomy.  So as she has gained about 20 pounds in the last year I think she is right at the weight gain is the sole cause of her dyspnea.  To play it safe, we will check a pulmonary function test and chest x-ray as well as overnight oximetry test to make sure that there's no evidence of an underlying pulmonary abnormality.  Plan: Pulmonary function test Use oxygen while exercising Chest x-ray Check overnight oximetry test while using CPAP and oxygen Lose weight. If she continues to have symptoms after weight loss then we can consider more extensive workup. However, I think losing weight will get her back to baseline.

## 2016-09-21 NOTE — Progress Notes (Signed)
Subjective:    Patient ID: Betty [name], female    DOB: 01-08-1946, 71 y.o.   MRN: [id number]  Synopsis: Betty [name] is a very pleasant female who first saw the [facility] pulmonary clinic in December 2013 for evaluation of shortness of breath. She is a lifetime nonsmoker. She developed increasing shortness of breath over the course of 2 years about 2011 to 2013. Pulmonary function testing showed restrictive lung disease with a markedly depressed DLCO at 35% predicted. CT scanning of her chest showed upper lobe groundglass abnormalities versus air trapping.  A home polysomnogram showed an AHI of 12 and multiple desaturation events to 80% independent of apneas.  An abg showed resting hypercarbia (pCO2 52), and a MIP/MEP was 36%/28% predicted.  A follow up diaphragm study was normal.  A repeat CT scan was performed, no pulmonary embolism was seen and radiology commented on atelectasis but no clear intersitial lung disease.  Blood work including an HP panel and serologies for connective tissue diseases has been essentially negative (two partial aspergillus bands on HP panel).  Two echocardiograms have not shown evidence of pulmonary hypertension.  After starting on CPAP with nasal pillows and oxygen with exertion and sleep she started feeling better.  A right heart catheterization performed 01/21/2013 did not show pulmonary hypertension.  She had an open lung biopsy at [facility] in November of 2014 which showed small amounts of fibrosis around her pulmonary arteries as well as findings consistent with very mild bronchiolitis. An exercise right heart catheterization test was performed which demonstrated dramatic increases in her left heart pressure with exercise  HPI Chief Complaint  Patient presents with  . Advice Only    pt last seen 12/2013- reestablishing care at cardiologist request.    Betty [name] is here to see me because she has been experiencing more dyspnea lately.   She has been following with the cardiology group for a few years now.  She has been experienicng some dyspnea when she is walking up stairs, bending over.  She may feel a little winded when carrying groceries.  No coughing or wheezing.  No mucus production.   No leg swelling.    She she is not significantly limited by her dyspnea.  She still takes care of the house with the exception of vacuuming.  Recently she was noted to have hypoxemia on exertion in the cardiology clinic.  She tells me that she stopped using oxygen on exertion about 2 months ago  She is wearing CPAP nightly.  She will have a nap from time to time and doesn't feel as energized as she did when she first started the CPAP therapy.    Past Medical History:  Diagnosis Date  . Allergic rhinitis    never tested, fall and spring  . Arthritis    hands,knees, feet  . Cataracts, bilateral    not surgical yet  . Chronic diastolic CHF (congestive heart failure) ([place])   . Chronic respiratory failure ([place])   . Diabetes mellitus without complication ([place])   . Diverticulosis    problems with frequent gas, burping  . Glucose intolerance (impaired glucose tolerance)   . Gout   . History of chicken pox   . Hyperlipidemia   . Hypertension   . NICM (nonischemic cardiomyopathy) ([place]) 2002   EF 25%; improved to normal - echo 4/08: EF 50-55%, mild MR, mild LAE, mild TR;    cath 3/03: normal cors, EF 40%  . Obesity   .  PONV (postoperative nausea and vomiting)   . Restrictive lung disease   . Skin cancer    skin cancers only  . Sleep apnea    cpap settings 4     Family History  Problem Relation Age of Onset  . Lymphoma Mother   . Cancer Mother 28    Lymphoma  . COPD Father     was a smoker  . Stroke Father   . Pneumonia Father   . Diabetes Father   . Hyperlipidemia Father   . Heart disease Father   . Heart attack Father   . Hypertension Father   . Diabetes Sister   . Depression Sister   . Meniere's disease Brother     . Diabetes Paternal Grandfather   . Heart disease Paternal Grandfather   . Schizophrenia Daughter   . Cancer Maternal Grandmother     Bladder  . Cancer Maternal Grandfather     leukemia  . Hypertension Brother   . Colon cancer Neg Hx   . Stomach cancer Neg Hx      Social History   Social History  . Marital status: Married    Spouse name: N/A  . Number of children: 2  . Years of education: N/A   Occupational History  . Retired Pharmacist, hospital Retired  . Works at [employer]  . Smoking status: Never Smoker  . Smokeless tobacco: Never Used  . Alcohol use No  . Drug use: No  . Sexual activity: No   Other Topics Concern  . Not on file   Social History Narrative   Lives in [place] with husband. 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 No pets.     Allergies  Allergen Reactions  . Amoxicillin     REACTION: RASH  . Etodolac     Bloody stool  . Hctz [Hydrochlorothiazide]     Increases gout flare  . Meloxicam     constipation  . Metformin And Related     diarrhea  . Sulfonamide Derivatives     REACTION: RASH     Outpatient Medications Prior to Visit  Medication Sig Dispense Refill  . aspirin 81 MG EC tablet Take 81 mg by mouth daily.     . Calcium Citrate-Vitamin D 200-125 MG-UNIT TABS Take 250 Units by mouth 2 (two) times daily.     . carvedilol (COREG) 12.5 MG tablet TAKE 1 TABLET BY MOUTH TWICE A DAY WITH A MEAL 180 tablet 1  . Cholecalciferol (VITAMIN D-3) 1000 UNITS CAPS Take 1,000 Units by mouth 2 (two) times daily. GUMMIES    . Coenzyme Q10 (CO Q 10) 100 MG CAPS Take 100 mg by mouth at bedtime.     . Fish Oil OIL Take 227 mcg by mouth 2 (two) times daily. GUMMIES    . furosemide (LASIX) 40 MG tablet Take 1 tablet (40 mg total) by mouth daily. Take extra tablet once daily as needed for weight gain or swelling. 180 tablet 3  . losartan (COZAAR) 50 MG tablet Take 0.5 tablets (25 mg total) by mouth daily. 90 tablet 0  .  multivitamin (THERAGRAN) per tablet Take 1 tablet by mouth daily.     . polyvinyl alcohol (LIQUIFILM TEARS) 1.4 % ophthalmic solution Place 1 drop into both eyes every morning.    . potassium chloride SA (KLOR-CON M20) 20 MEQ tablet TAKE 2 TABLETS EVERY MORNING AND 1 TABLET EACH EVENING 90 tablet 9  . simvastatin (  ZOCOR) 20 MG tablet TAKE 1 TABLET BY MOUTH AT BEDTIME. 90 tablet 3  . Naproxen Sodium (ALEVE) 220 MG CAPS Take 220 mg by mouth 2 (two) times daily.     No facility-administered medications prior to visit.       Review of Systems  Constitutional: Negative for fever and unexpected weight change.  HENT: Negative for congestion, dental problem, ear pain, nosebleeds, postnasal drip, rhinorrhea, sinus pressure, sneezing, sore throat and trouble swallowing.   Eyes: Negative for redness and itching.  Respiratory: Positive for shortness of breath. Negative for cough, chest tightness and wheezing.   Cardiovascular: Negative for palpitations and leg swelling.  Gastrointestinal: Negative for nausea and vomiting.  Genitourinary: Negative for dysuria.  Musculoskeletal: Negative for joint swelling.  Skin: Negative for rash.  Neurological: Negative for headaches.  Hematological: Does not bruise/bleed easily.  Psychiatric/Behavioral: Negative for dysphoric mood. The patient is not nervous/anxious.        Objective:   Physical Exam Vitals:   09/21/16 1436  BP: 122/62  Pulse: 74  SpO2: 94%  Weight: 192 lb (87.1 kg)  Height: 5' 5"  (1.651 m)   RA  Gen: well appearing, no acute distress HENT: NCAT, OP clear, neck supple without masses Eyes: PERRL, EOMi Lymph: no cervical lymphadenopathy PULM: Few crackles bases B, normal effort CV: RRR, no mgr, no JVD GI: BS+, soft, nontender, no hsm Derm: no rash or skin breakdown MSK: normal bulk and tone Neuro: A&Ox4, CN II-XII intact, strength 5/5 in all 4 extremities Psyche: normal mood and affect   06/2012 Echo>> grade 1 diastolic  dysfunction, LVEF 55%, RV/RA normal, triscupid regurge jet velocity WNL 08/2012 PFT [facility] >> Ratio normal, Flow volume loop normal; FVC 1.46 L (48% pred), TLC 2.77 L (55% pred) ERV 0.03 (3% pred), DLCO 8.4 (35% pred) 08/2012 ABG >> 7.39/52/57/31.5/92% 10/01/2012 PSG>> poor sleep efficiency; AHI < 5; desaturated as low as 82% 09/2012 PFT [facility] >> unchanged from 08/2012 study. 10/20/2012 MIP/MEP >> 36 (36% pred)/-20 (28% pred) 09/2012 serology>> ANA neg, Anti-SCL70 neg, SSA/SSB neg/neg, Anti-Jo-1 neg, Aldolase 5, CRP 5, ESR 30, RF 31 10/24/2012 Sniff test >> normal diaphragm movement 09/2012 Home PSG >> AHI 12, several desaturation events independent of apnea 09/2012 Hypersensitivity Pneumonitis profile> Asp flav pos/asp nig partial; all else negative 12/09/2012 6MW >> 950 feet on 2L [place] 12/16/2012 V/Q scan >> multiple areas of matched decreased ventilation and perfusion uptake, more in LUL 01/21/2013 RHC >> RA 9 mmHg, RV 32/8 mmHg, PA 30/16/23 mmHg, PCWP 12 mmHg, Normal wave forms. Oxygen saturations: (done with 2 L [place]), PA 73 %, AO 98%, RV 74%, RA 75%, SVC 75%, IVC 83%; Cardiac Output (Fick) 5.3, Cardiac Index (Fick) 2.7, PVR: 2.1   07/2013 Open lung biopsy [facility]> mild peribronchiolar fibrosis without inflammatory cells, also mild scattered areas of pulmonary arteriopathic change; normal airspace/alveolar tissue  10/2013 Exercise [facility]HC at [facility] rest> PA 36/20 (mean 26), PCW 15, RA 9, BP 158/77, PA sat 65.5%, CI 2.88, CO 5.7, PVR 1.75 WU; Exercise> PA 78/40 (56), PCW 28, BP 228/83, CI 6.9, CO 13.75, PVR 2 WU; O2 saturation noted to drop to 77% on RA  Records from her visit with her primary care physician and the CHF clinic were reviewed. She was referred to my clinic for further evaluation of shortness of breath.     Assessment & Plan:  Dyspnea She has increasing dyspnea in the last several months associated with a 20 pound weight gain.  As demonstrated from all the  records in our system I put her through a  fairly extensive workup back in 2014 2 2015 including many different studies including an open lung biopsy, heart catheterizations, and multiple imaging studies. Ultimately we came up with a diagnosis of restrictive lung disease of uncertain etiology, some degree of peribronchiolar fibrosis/bronchiolitis, mild pulmonary arteriopathy change, obstructive sleep apnea, and diastolic heart failure.  I've never really had convincing evidence that she has significant pulmonary parenchymal pathology. For whatever reason whenever she gains weight she tends to drop her oxygen saturation more, I think this has something to do with her anatomy.  So as she has gained about 20 pounds in the last year I think she is right at the weight gain is the sole cause of her dyspnea.  To play it safe, we will check a pulmonary function test and chest x-ray as well as overnight oximetry test to make sure that there's no evidence of an underlying pulmonary abnormality.  Plan: Pulmonary function test Use oxygen while exercising Chest x-ray Check overnight oximetry test while using CPAP and oxygen Lose weight. If she continues to have symptoms after weight loss then we can consider more extensive workup. However, I think losing weight will get her back to baseline.  Restrictive lung disease As above  Chronic hypoxemic respiratory failure (HCC) Continue to use supplemental oxygen with exertion and daily at bedtime.  Obstructive sleep apnea Continue CPAP daily at bedtime with oxygen. Check overnight except her with these. If she continues to have symptoms of fatigue despite weight loss then repeat CPAP titration study.  Chronic diastolic heart failure (HCC) Continue follow-up with the advanced heart failure clinic.    Current Outpatient Prescriptions:  .  aspirin 81 MG EC tablet, Take 81 mg by mouth daily. , Disp: , Rfl:  .  Calcium Citrate-Vitamin D 200-125 MG-UNIT TABS, Take 250 Units by mouth 2 (two) times daily.  , Disp: , Rfl:  .  carvedilol (COREG) 12.5 MG tablet, TAKE 1 TABLET BY MOUTH TWICE A DAY WITH A MEAL, Disp: 180 tablet, Rfl: 1 .  Cholecalciferol (VITAMIN D-3) 1000 UNITS CAPS, Take 1,000 Units by mouth 2 (two) times daily. GUMMIES, Disp: , Rfl:  .  Coenzyme Q10 (CO Q 10) 100 MG CAPS, Take 100 mg by mouth at bedtime. , Disp: , Rfl:  .  Fish Oil OIL, Take 227 mcg by mouth 2 (two) times daily. GUMMIES, Disp: , Rfl:  .  furosemide (LASIX) 40 MG tablet, Take 1 tablet (40 mg total) by mouth daily. Take extra tablet once daily as needed for weight gain or swelling., Disp: 180 tablet, Rfl: 3 .  losartan (COZAAR) 50 MG tablet, Take 0.5 tablets (25 mg total) by mouth daily., Disp: 90 tablet, Rfl: 0 .  multivitamin (THERAGRAN) per tablet, Take 1 tablet by mouth daily. , Disp: , Rfl:  .  polyvinyl alcohol (LIQUIFILM TEARS) 1.4 % ophthalmic solution, Place 1 drop into both eyes every morning., Disp: , Rfl:  .  potassium chloride SA (KLOR-CON M20) 20 MEQ tablet, TAKE 2 TABLETS EVERY MORNING AND 1 TABLET EACH EVENING, Disp: 90 tablet, Rfl: 9 .  simvastatin (ZOCOR) 20 MG tablet, TAKE 1 TABLET BY MOUTH AT BEDTIME., Disp: 90 tablet, Rfl: 3

## 2016-09-21 NOTE — Assessment & Plan Note (Signed)
Continue follow up with the advanced heart failure clinic. 

## 2016-09-21 NOTE — Assessment & Plan Note (Signed)
As above.

## 2016-09-21 NOTE — Patient Instructions (Signed)
Use your oxygen when exercising We will arrange for a test in your home to check your oxygen when you are sleeping with CPAP and oxygen We will arrange for a lung function test We will call you with the results of today's chest x-ray Do your best to lose weight, we'll see you back in about 2 months

## 2016-09-21 NOTE — Assessment & Plan Note (Signed)
Continue to use supplemental oxygen with exertion and daily at bedtime.

## 2016-09-25 ENCOUNTER — Ambulatory Visit: Payer: Medicare Other | Admitting: Physical Therapy

## 2016-09-25 DIAGNOSIS — R262 Difficulty in walking, not elsewhere classified: Secondary | ICD-10-CM

## 2016-09-25 DIAGNOSIS — G8929 Other chronic pain: Secondary | ICD-10-CM

## 2016-09-25 DIAGNOSIS — M5442 Lumbago with sciatica, left side: Secondary | ICD-10-CM

## 2016-09-25 DIAGNOSIS — M6281 Muscle weakness (generalized): Secondary | ICD-10-CM

## 2016-09-25 NOTE — Therapy (Signed)
[place] MAIN [facility] SERVICES [address] [place], [place], [address] Phone: [phone]   Fax:  [phone]  Physical Therapy Treatment  Patient Details  Name: Betty [name] MRN: [id number] Date of Birth: July 26, 1946 Referring Provider: Caryl Bis  Encounter Date: 09/25/2016    Past Medical History:  Diagnosis Date  . Allergic rhinitis    never tested, fall and spring  . Arthritis    hands,knees, feet  . Cataracts, bilateral    not surgical yet  . Chronic diastolic CHF (congestive heart failure) ([place])   . Chronic respiratory failure ([place])   . Diabetes mellitus without complication ([place])   . Diverticulosis    problems with frequent gas, burping  . Glucose intolerance (impaired glucose tolerance)   . Gout   . History of chicken pox   . Hyperlipidemia   . Hypertension   . NICM (nonischemic cardiomyopathy) ([place]) 2002   EF 25%; improved to normal - echo 4/08: EF 50-55%, mild MR, mild LAE, mild TR;    cath 3/03: normal cors, EF 40%  . Obesity   . PONV (postoperative nausea and vomiting)   . Restrictive lung disease   . Skin cancer    skin cancers only  . Sleep apnea    cpap settings 4    Past Surgical History:  Procedure Laterality Date  . BILATERAL VATS ABLATION Right    biopsy done 2015- [facility]  . BREAST BIOPSY Left 80's   Cyst removed  . CARDIAC CATHETERIZATION    . choleystectomy  02/2002  . COLONOSCOPY WITH PROPOFOL N/A 12/20/2014   Procedure: COLONOSCOPY WITH PROPOFOL;  Surgeon: [name], MD;  Location: [facility] ENDOSCOPY;  Service: Gastroenterology;  Laterality: N/A;  . CYSTECTOMY  1996   l breast  . DILATION AND CURETTAGE OF UTERUS  2011   Dr. Hulan Fray at [facility]  . [place]    . nsvd     x 2  . TUBAL LIGATION  1975  . VAGINAL DELIVERY     x2    There were no vitals filed for this visit.                                    PT Long Term Goals - 07/03/16 [id number]      PT  LONG TERM GOAL #1   Title pt will be able to stand 20 min with no c/o pain to return to cooking   Baseline pain with 5 min standing; 07/03/16: 10 minutes   Time 4   Period Weeks   Status On-going     PT LONG TERM GOAL #2   Title Pt will participate in 6MWT pain free to improve functional gait activity level   Baseline pain with 3 min walking;  07/03/16:    Time 4   Period Weeks   Status On-going     PT LONG TERM GOAL #3   Title Pt will be I with HEP to manage pain symptoms using flexion activity   Baseline 07/03/16: not consistent with HEP   Time 4   Period Weeks   Status On-going     PT LONG TERM GOAL #4   Title Pt will decreased mODI by at least 13% in order to demonstrate clinically significant reduction in pain   Baseline 07/03/16: 38%   Time 4   Period Weeks   Status New  Patient will benefit from skilled therapeutic intervention in order to improve the following deficits and impairments:     Visit Diagnosis: Difficulty in walking, not elsewhere classified  Chronic midline low back pain with left-sided sciatica  Muscle weakness (generalized)     Problem List Patient Active Problem List   Diagnosis Date Noted  . Restrictive lung disease 08/06/2016  . Left-sided low back pain with left-sided sciatica 05/01/2016  . Excessive cerumen in right ear canal 05/01/2016  . Routine general medical examination at a health care facility 01/27/2016  . Need for hepatitis C screening test 04/21/2015  . History of colonic polyps   . Benign neoplasm of ascending colon   . Benign neoplasm of transverse colon   . Rash and nonspecific skin eruption 08/30/2014  . Postmenopausal estrogen deficiency 08/30/2014  . Left Achilles tendinitis 04/23/2014  . Chronic diastolic heart failure ([place]) 04/08/2014  . Obesity (BMI 30-39.9) 04/09/2013  . Diabetes mellitus type 2, controlled ([place]) 01/07/2013  . Edema 01/07/2013  . Medicare annual wellness visit, subsequent  11/28/2012  . Chronic hypoxemic respiratory failure ([place]) 11/11/2012  . Obstructive sleep apnea 11/11/2012  . Dyspnea 08/18/2012  . Fatigue 08/18/2012  . Meralgia paresthetica of left side 08/20/2011  . Myalgia 05/03/2011  . Leg pain, lateral 04/12/2011  . GOUT 12/26/2009  . CARDIOMYOPATHY 10/08/2007  . ALLERGIC RHINITIS, SEASONAL 10/08/2007  . CHOLECYSTECTOMY, HX OF 10/08/2007  . HYPERLIPIDEMIA, MIXED 05/02/2007    Jerl Mina 09/25/2016, 9:57 PM  [place] MAIN [facility] SERVICES [address] [place], [place], [address] Phone: [phone]   Fax:  [phone]  Name: Betty [name] MRN: [id number] Date of Birth: 05/06/1946

## 2016-09-26 NOTE — Therapy (Signed)
[place] [facility] [address] [place], [address] Phone: [phone]   Fax:  [phone]  Patient Details  Name: Betty [name] MRN: [id number] Date of Birth: [birth year] Referring Provider:  Leone Haven, MD  Encounter Date: 09/20/2016   Pt not seen for session as she is doing well and will be seeing aquatic PT on land in two days.  Fisher,Benjamin 09/26/2016, 8:51 AM  [place] [facility] [address], [place], [address] Phone: [phone]   Fax:  [phone]

## 2016-09-27 ENCOUNTER — Encounter: Payer: Self-pay | Admitting: Pulmonary Disease

## 2016-09-30 NOTE — Patient Instructions (Signed)
You are now ready to begin training the deep core muscles system: diaphragm, transverse abdominis, pelvic floor . These muscles must work together as a team.           The key to these exercises to train the brain to coordinate the timing of these muscles and to have them turn on for long periods of time to hold you upright against gravity (especially important if you are on your feet all day).These muscles are postural muscles and play a role stabilizing your spine and bodyweight. By doing these repetitions slowly and correctly instead of doing crunches, you will achieve a flatter belly without a lower pooch. You are also placing your spine in a more neutral position and breathing properly which in turn, decreases your risk for problems related to your pelvic floor, abdominal, and low back such as pelvic organ prolapse, hernias, diastasis recti (separation of superficial muscles), disk herniations, spinal fractures. These exercises set a solid foundation for you to later progress to resistance/ strength training with therabands and weights and return to other typical fitness exercises with a stronger deeper core.   Do level 1 : 10 reps Do level 2: 10 reps (left and right = 1 rep) x 3 sets , 2 x day Do not progress to level 3 for 3-4 weeks. You know you are ready when you do not have any rocking of pelvis nor arching in your back    Hip extension 10 reps without bending the back 10 reps x 2

## 2016-09-30 NOTE — Therapy (Signed)
[place] MAIN [facility] SERVICES [address] [place], [place], [address] Phone: [phone]   Fax:  [phone]  Physical Therapy Treatment  Patient Details  Name: Betty [name] MRN: [id number] Date of Birth: 08/29/1945 Referring Provider: Caryl Bis  Encounter Date: 09/25/2016      PT End of Session - 09/30/16 1934    Visit Number 16   Number of Visits 17   Date for PT Re-Evaluation 12/05/16   Authorization Type g code 5/10   PT Start Time 0800   PT Stop Time 0900   PT Time Calculation (min) 60 min   Activity Tolerance Patient tolerated treatment well;No increased pain   Behavior During Therapy WFL for tasks assessed/performed      Past Medical History:  Diagnosis Date  . Allergic rhinitis    never tested, fall and spring  . Arthritis    hands,knees, feet  . Cataracts, bilateral    not surgical yet  . Chronic diastolic CHF (congestive heart failure) ([place])   . Chronic respiratory failure ([place])   . Diabetes mellitus without complication ([place])   . Diverticulosis    problems with frequent gas, burping  . Glucose intolerance (impaired glucose tolerance)   . Gout   . History of chicken pox   . Hyperlipidemia   . Hypertension   . NICM (nonischemic cardiomyopathy) ([place]) 2002   EF 25%; improved to normal - echo 4/08: EF 50-55%, mild MR, mild LAE, mild TR;    cath 3/03: normal cors, EF 40%  . Obesity   . PONV (postoperative nausea and vomiting)   . Restrictive lung disease   . Skin cancer    skin cancers only  . Sleep apnea    cpap settings 4    Past Surgical History:  Procedure Laterality Date  . BILATERAL VATS ABLATION Right    biopsy done 2015- [facility]  . BREAST BIOPSY Left 80's   Cyst removed  . CARDIAC CATHETERIZATION    . choleystectomy  02/2002  . COLONOSCOPY WITH PROPOFOL N/A 12/20/2014   Procedure: COLONOSCOPY WITH PROPOFOL;  Surgeon: [name], MD;  Location: [facility];  Service: Gastroenterology;  Laterality:  N/A;  . CYSTECTOMY  1996   l breast  . DILATION AND CURETTAGE OF UTERUS  2011   Dr. Hulan Fray at [facility]  . [place]    . nsvd     x 2  . TUBAL LIGATION  1975  . VAGINAL DELIVERY     x2    There were no vitals filed for this visit.      Subjective Assessment - 09/30/16 1928    Subjective Pt reports her pain radiates to the mid posterior thigh and the area of pain is located consistently at L glut.    Pertinent History pain with rolling in bed, laying in bed   How long can you sit comfortably? no problems   How long can you stand comfortably? immediate pain   How long can you walk comfortably? immediate pain   Diagnostic tests MRI - L4-L5 disc herniation   Patient Stated Goals decr. pain, return to exercise             [facility] PT Assessment - 09/30/16 1930      AROM   Overall AROM Comments standing hip ext: R without lumbopelvic dissassociation > L, ~ 10 deg  post Tx" ~15 deg       Palpation   SI assessment  R PSIS with more  limited arthrokinematics,    Palpation comment low abdomen with increased fascial restrictions                     [facility] Adult PT Treatment/Exercise - 09/30/16 1933      Therapeutic Activites    Therapeutic Activities --  see pt instructions     Neuro Re-ed    Neuro Re-ed Details  see pt instructions     Manual Therapy   Manual therapy comments long axis distraction RLE, rotational mob flexion. ext,  scar release over abdomen                 PT Education - 09/30/16 1931    Education provided Yes   Education Details HEP   Person(s) Educated Patient   Methods Explanation;Demonstration;Tactile cues;Verbal cues;Handout   Comprehension Verbalized understanding;Returned demonstration             PT Long Term Goals - 07/03/16 [id number]      PT LONG TERM GOAL #1   Title pt will be able to stand 20 min with no c/o pain to return to cooking   Baseline pain with 5 min standing; 07/03/16: 10 minutes   Time 4    Period Weeks   Status On-going     PT LONG TERM GOAL #2   Title Pt will participate in 6MWT pain free to improve functional gait activity level   Baseline pain with 3 min walking;  07/03/16:    Time 4   Period Weeks   Status On-going     PT LONG TERM GOAL #3   Title Pt will be I with HEP to manage pain symptoms using flexion activity   Baseline 07/03/16: not consistent with HEP   Time 4   Period Weeks   Status On-going     PT LONG TERM GOAL #4   Title Pt will decreased mODI by at least 13% in order to demonstrate clinically significant reduction in pain   Baseline 07/03/16: 38%   Time 4   Period Weeks   Status New               Plan - 09/30/16 1935    Clinical Impression Statement Pt demo'd pelvic obliquities, limited hip ROM in extension on RLE, and increased abdominal fascial restrictions. Suspect these deficits contribute to her remaining complaints. Pt demo'd increased hip ROM,  less abdominal scar restrictions,  and  less pelvic obliquites post Tx. Anticipate pt will achieve her remaining goals with skilled Pelvic [facility] PT approaches.      Rehab Potential Fair   Clinical Impairments Affecting Rehab Potential medical comorbidities/motivation   PT Frequency 2x / week   PT Duration 4 weeks   PT Treatment/Interventions ADLs/Self Care Home Management;Aquatic Therapy;Manual techniques;Therapeutic exercise;Functional mobility training;Therapeutic activities;Patient/family education   Consulted and Agree with Plan of Care Patient      Patient will benefit from skilled therapeutic intervention in order to improve the following deficits and impairments:  Pain, Improper body mechanics, Difficulty walking, Hypomobility, Decreased strength, Decreased range of motion  Visit Diagnosis: Difficulty in walking, not elsewhere classified  Chronic midline low back pain with left-sided sciatica  Muscle weakness (generalized)     Problem List Patient Active Problem List    Diagnosis Date Noted  . Restrictive lung disease 08/06/2016  . Left-sided low back pain with left-sided sciatica 05/01/2016  . Excessive cerumen in right ear canal 05/01/2016  . Routine general medical examination at a health care facility  01/27/2016  . Need for hepatitis C screening test 04/21/2015  . History of colonic polyps   . Benign neoplasm of ascending colon   . Benign neoplasm of transverse colon   . Rash and nonspecific skin eruption 08/30/2014  . Postmenopausal estrogen deficiency 08/30/2014  . Left Achilles tendinitis 04/23/2014  . Chronic diastolic heart failure ([place]) 04/08/2014  . Obesity (BMI 30-39.9) 04/09/2013  . Diabetes mellitus type 2, controlled ([place]) 01/07/2013  . Edema 01/07/2013  . Medicare annual wellness visit, subsequent 11/28/2012  . Chronic hypoxemic respiratory failure ([place]) 11/11/2012  . Obstructive sleep apnea 11/11/2012  . Dyspnea 08/18/2012  . Fatigue 08/18/2012  . Meralgia paresthetica of left side 08/20/2011  . Myalgia 05/03/2011  . Leg pain, lateral 04/12/2011  . GOUT 12/26/2009  . CARDIOMYOPATHY 10/08/2007  . ALLERGIC RHINITIS, SEASONAL 10/08/2007  . CHOLECYSTECTOMY, HX OF 10/08/2007  . HYPERLIPIDEMIA, MIXED 05/02/2007    Jerl Mina ,PT, DPT, E-RYT  09/30/2016, 7:39 PM  [place] MAIN [facility] SERVICES [address] [place], [place], [address] Phone: [phone]   Fax:  [phone]  Name: Betty [name] MRN: [id number] Date of Birth: [birth year]

## 2016-10-02 ENCOUNTER — Ambulatory Visit: Payer: Medicare Other | Attending: Family Medicine | Admitting: Physical Therapy

## 2016-10-02 DIAGNOSIS — M791 Myalgia, unspecified site: Secondary | ICD-10-CM

## 2016-10-02 DIAGNOSIS — G8929 Other chronic pain: Secondary | ICD-10-CM | POA: Diagnosis present

## 2016-10-02 DIAGNOSIS — M542 Cervicalgia: Secondary | ICD-10-CM | POA: Diagnosis present

## 2016-10-02 DIAGNOSIS — M5442 Lumbago with sciatica, left side: Secondary | ICD-10-CM | POA: Insufficient documentation

## 2016-10-02 DIAGNOSIS — M6281 Muscle weakness (generalized): Secondary | ICD-10-CM | POA: Diagnosis present

## 2016-10-02 DIAGNOSIS — R262 Difficulty in walking, not elsewhere classified: Secondary | ICD-10-CM | POA: Insufficient documentation

## 2016-10-02 NOTE — Patient Instructions (Addendum)
Open book exercise (handout) to increase midback spine flexibility  and to be able to turn trunk while pelvis remains stationary.   Continue with deep core level 1-2

## 2016-10-02 NOTE — Therapy (Signed)
[place] MAIN [facility] SERVICES [address] [place], [place], [address] Phone: [phone]   Fax:  [phone]  Physical Therapy Treatment  Patient Details  Name: Betty [name] MRN: [id number] Date of Birth: September 24, 1945 Referring Provider: Caryl Bis  Encounter Date: 10/02/2016      PT End of Session - 10/02/16 1946    Visit Number 17   Number of Visits 17   Date for PT Re-Evaluation 12/05/16   Authorization Type g code 6/10   PT Start Time 0817   PT Stop Time 0910   PT Time Calculation (min) 53 min   Activity Tolerance Patient tolerated treatment well;No increased pain   Behavior During Therapy WFL for tasks assessed/performed      Past Medical History:  Diagnosis Date  . Allergic rhinitis    never tested, fall and spring  . Arthritis    hands,knees, feet  . Cataracts, bilateral    not surgical yet  . Chronic diastolic CHF (congestive heart failure) ([place])   . Chronic respiratory failure ([place])   . Diabetes mellitus without complication ([place])   . Diverticulosis    problems with frequent gas, burping  . Glucose intolerance (impaired glucose tolerance)   . Gout   . History of chicken pox   . Hyperlipidemia   . Hypertension   . NICM (nonischemic cardiomyopathy) ([place]) 2002   EF 25%; improved to normal - echo 4/08: EF 50-55%, mild MR, mild LAE, mild TR;    cath 3/03: normal cors, EF 40%  . Obesity   . PONV (postoperative nausea and vomiting)   . Restrictive lung disease   . Skin cancer    skin cancers only  . Sleep apnea    cpap settings 4    Past Surgical History:  Procedure Laterality Date  . BILATERAL VATS ABLATION Right    biopsy done 2015- [facility]  . BREAST BIOPSY Left 80's   Cyst removed  . CARDIAC CATHETERIZATION    . choleystectomy  02/2002  . COLONOSCOPY WITH PROPOFOL N/A 12/20/2014   Procedure: COLONOSCOPY WITH PROPOFOL;  Surgeon: [name], MD;  Location: [facility];  Service: Gastroenterology;  Laterality:  N/A;  . CYSTECTOMY  1996   l breast  . DILATION AND CURETTAGE OF UTERUS  2011   Dr. Hulan Fray at [facility]  . [place]    . nsvd     x 2  . TUBAL LIGATION  1975  . VAGINAL DELIVERY     x2    There were no vitals filed for this visit.      Subjective Assessment - 10/02/16 0823    Subjective Pt reported her hip pain has lessened since last session and she has not noticed any radiating pain. Four days ago, pt woke up with a stiff neck and tenderness to the touch around the side and the back around the neck. The night before, pt got cold and she slept in fetal position "tightly". Pt has not been able to move her neck. Pt has not contacted her MD. Pt did not get any fevers and chills. Denied swollen lymph nodes or pain in the front of the neck. Radiating pain occurs occasionally down B shoulders below the shoulder socket.       Pertinent History pain with rolling in bed, laying in bed   How long can you sit comfortably? no problems   How long can you stand comfortably? immediate pain   How long can you walk comfortably?  immediate pain   Diagnostic tests MRI - L4-L5 disc herniation   Patient Stated Goals decr. pain, return to exercise             [facility] PT Assessment - 10/02/16 0832      Sensation   Additional Comments dermatome testing UE B: neg      AROM   Overall AROM  --  neck ext SB 15 deg B,flexion20 deg,Rotate L 25deg, R 30 d   Overall AROM Comments cued for thoracic ext, scap depression,  pt demo'd increase of 5 deg rotation B and SB . after open book ex:  pt demo' increased SB ROM, B 25 deg, ext 25 deg    lateral flexion at lumbar spine B without pain      Palpation   Spinal mobility increased thoracic hypomobility/ limited segmental movement, increased mm tensions at midback mm.  decreased ability to dissassociate trunk from pelvis    SI assessment  no pelvic asymmetries   no tenderness with palpation at SIJ/posterior glut area on R   Palpation comment  increased upper trap tightness and tenderness B                      [facility] Adult PT Treatment/Exercise - 10/02/16 1944      Therapeutic Activites    Therapeutic Activities --  see pt instructions      Neuro Re-ed    Neuro Re-ed Details  see pt instructions                PT Education - 10/02/16 0906    Education provided Yes   Education Details HEP    Person(s) Educated Patient   Methods Explanation;Demonstration;Tactile cues;Verbal cues;Handout   Comprehension Returned demonstration;Verbalized understanding             PT Long Term Goals - 10/02/16 0851      PT LONG TERM GOAL #1   Title pt will be able to stand 20 min with no c/o pain to return to cooking   Baseline pain with 5 min standing; 07/03/16: 10 minutes   Time 4   Period Weeks   Status Achieved     PT LONG TERM GOAL #2   Title Pt will participate in 6MWT pain free to improve functional gait activity level   Baseline pain with 3 min walking;  07/03/16:    Time 4   Period Weeks   Status Partially Met     PT LONG TERM GOAL #3   Title Pt will be I with HEP to manage pain symptoms using flexion activity   Baseline 07/03/16: not consistent with HEP   Time 4   Period Weeks   Status On-going     PT LONG TERM GOAL #4   Title Pt will decreased mODI by at least 13% in order to demonstrate clinically significant reduction in pain   Baseline 07/03/16: 38%   Time 4   Period Weeks   Status On-going     PT LONG TERM GOAL #5   Title Pt will demo increased cervical ROM from 15 deg B to > 25 deg sidebend and rotation from 30 deg R toand 25 deg L to > 40 deg B in order to turn neck when diving   Time 12   Period Weeks   Status New     Additional Long Term Goals   Additional Long Term Goals Yes     PT LONG TERM GOAL #6  Title Pt will report decreased neck pain from 3/10 to 0/10 and decreased mm upper trap tensions/tenderness  in order to don/doff jacket   Time 12   Period Weeks   Status  New               Plan - 10/02/16 1941    Clinical Impression Statement  Pt demo'd significantly decreased abdominal scar restrictions and improved hip ext ROM with proper lumbopelvic rhythm and arthrokinematics compared to last session. Pt reported she has not noticed any LE radiating pain. Anticiapte deep core and thoracic mobility  HEP will help pt build postural/pelvic stability and correct forward head/thoacic kyphotic posture.  Pt also showed nopelvic obliquities.   Pt reported a stiff neck for the past 4 days that limited her ability to turn when driving or when doffing/ donning a jacket. Pt 's assessment showed decreased cervical ROM in most directions, increased upper trap mm tensions, and poor scapula control, and forward head.  MMT and dermatomal tests were neg. Suspect neck Sx are due to increased upper trap tensions B and poor scapular control because pt demo'd increased cervical ROM with cue for thoracic extension, scapular retraction/depression. New goals have been added to re-cert and an order has been faxed to MD w/ a request to treat neck at next session.   Pt will continue to benefit from skilled PT    Rehab Potential Fair   Clinical Impairments Affecting Rehab Potential medical comorbidities/motivation   PT Frequency 2x / week   PT Duration 4 weeks   PT Treatment/Interventions ADLs/Self Care Home Management;Aquatic Therapy;Manual techniques;Therapeutic exercise;Functional mobility training;Therapeutic activities;Patient/family education   Consulted and Agree with Plan of Care Patient      Patient will benefit from skilled therapeutic intervention in order to improve the following deficits and impairments:  Pain, Improper body mechanics, Difficulty walking, Hypomobility, Decreased strength, Decreased range of motion  Visit Diagnosis: Difficulty in walking, not elsewhere classified  Chronic midline low back pain with left-sided sciatica  Muscle weakness  (generalized)  Cervicalgia  Myalgia     Problem List Patient Active Problem List   Diagnosis Date Noted  . Restrictive lung disease 08/06/2016  . Left-sided low back pain with left-sided sciatica 05/01/2016  . Excessive cerumen in right ear canal 05/01/2016  . Routine general medical examination at a health care facility 01/27/2016  . Need for hepatitis C screening test 04/21/2015  . History of colonic polyps   . Benign neoplasm of ascending colon   . Benign neoplasm of transverse colon   . Rash and nonspecific skin eruption 08/30/2014  . Postmenopausal estrogen deficiency 08/30/2014  . Left Achilles tendinitis 04/23/2014  . Chronic diastolic heart failure ([place]) 04/08/2014  . Obesity (BMI 30-39.9) 04/09/2013  . Diabetes mellitus type 2, controlled ([place]) 01/07/2013  . Edema 01/07/2013  . Medicare annual wellness visit, subsequent 11/28/2012  . Chronic hypoxemic respiratory failure ([place]) 11/11/2012  . Obstructive sleep apnea 11/11/2012  . Dyspnea 08/18/2012  . Fatigue 08/18/2012  . Meralgia paresthetica of left side 08/20/2011  . Myalgia 05/03/2011  . Leg pain, lateral 04/12/2011  . GOUT 12/26/2009  . CARDIOMYOPATHY 10/08/2007  . ALLERGIC RHINITIS, SEASONAL 10/08/2007  . CHOLECYSTECTOMY, HX OF 10/08/2007  . HYPERLIPIDEMIA, MIXED 05/02/2007    Jerl Mina ,PT, DPT, E-RYT  10/02/2016, 7:52 PM  [place] MAIN [facility] SERVICES [address] [place], [place], [address] Phone: [phone]   Fax:  [phone]  Name: Betty [name] MRN: [id number] Date of  Birth: [birth year]

## 2016-10-03 ENCOUNTER — Encounter: Payer: Medicare Other | Admitting: Physical Therapy

## 2016-10-04 ENCOUNTER — Encounter: Payer: Self-pay | Admitting: Physical Therapy

## 2016-10-04 ENCOUNTER — Ambulatory Visit: Payer: Medicare Other | Admitting: Physical Therapy

## 2016-10-04 DIAGNOSIS — M542 Cervicalgia: Secondary | ICD-10-CM

## 2016-10-04 DIAGNOSIS — R262 Difficulty in walking, not elsewhere classified: Secondary | ICD-10-CM | POA: Diagnosis not present

## 2016-10-04 NOTE — Therapy (Signed)
[facility] [address], [place], [address] Phone: [phone]   Fax:  [phone]  Physical Therapy Treatment  Patient Details  Name: Betty [name] MRN: [id number] Date of Birth: [birth year] Referring Provider: Caryl Bis  Encounter Date: 10/04/2016      PT End of Session - 10/04/16 1405    Visit Number 18   Number of Visits 20   Date for PT Re-Evaluation 12/05/16   Authorization Type g code 6/10   PT Start Time 1108   PT Stop Time 1147   PT Time Calculation (min) 39 min   Activity Tolerance Patient tolerated treatment well   Behavior During Therapy [facility] for tasks assessed/performed      Past Medical History:  Diagnosis Date  . Allergic rhinitis    never tested, fall and spring  . Arthritis    hands,knees, feet  . Cataracts, bilateral    not surgical yet  . Chronic diastolic CHF (congestive heart failure) ([place])   . Chronic respiratory failure ([place])   . Diabetes mellitus without complication ([place])   . Diverticulosis    problems with frequent gas, burping  . Glucose intolerance (impaired glucose tolerance)   . Gout   . History of chicken pox   . Hyperlipidemia   . Hypertension   . NICM (nonischemic cardiomyopathy) ([place]) 2002   EF 25%; improved to normal - echo 4/08: EF 50-55%, mild MR, mild LAE, mild TR;    cath 3/03: normal cors, EF 40%  . Obesity   . PONV (postoperative nausea and vomiting)   . Restrictive lung disease   . Skin cancer    skin cancers only  . Sleep apnea    cpap settings 4    Past Surgical History:  Procedure Laterality Date  . BILATERAL VATS ABLATION Right    biopsy done 2015- [facility]  . BREAST BIOPSY Left 80's   Cyst removed  . CARDIAC CATHETERIZATION    . choleystectomy  02/2002  . COLONOSCOPY WITH PROPOFOL N/A 12/20/2014   Procedure: COLONOSCOPY WITH PROPOFOL;  Surgeon: [name], MD;  Location: [facility];  Service: Gastroenterology;  Laterality: N/A;  .  CYSTECTOMY  1996   l breast  . DILATION AND CURETTAGE OF UTERUS  2011   Dr. Hulan Fray at [facility]  . [place]    . nsvd     x 2  . TUBAL LIGATION  1975  . VAGINAL DELIVERY     x2    There were no vitals filed for this visit.      Subjective Assessment - 10/04/16 1110    Subjective Pt states it is difficult for her to turn her head to check for traffic, and to put her coat on. Pt became cold Thursday night, "scrunched up into fetal position" and the next morning woke up feeling tense. Pt reports feeling stiff in her neck.    Pertinent History pain with rolling in bed, laying in bed   How long can you sit comfortably? no problems   How long can you stand comfortably? immediate pain   How long can you walk comfortably? immediate pain   Diagnostic tests MRI - L4-L5 disc herniation   Patient Stated Goals decr. pain, return to exercise    Currently in Pain? No/denies   Pain Score 0-No pain      Objective:  CPA joint mobs grade 2 at T1, T2, hypomobile, pt reports it feels "stingy" but did not reproduce  pain. 3x30 sec joint mobs to increase joint mobility. No change following this in ROM or pain.  R UPA joint mobs grade 2 at T1, T2, pt reports "it feels good". 3x30 sec joint mobs to decrease pain. Reasessed, increased R cervical rotation ~5 degrees. "I can turn more easily."  Mobilization with movement, lumbrical grip on L upper trapezius pulled posteriorly, while pt performed L cervical rotation, to decrease cervicothoracic tension and decrease neck stiffness. Pt reports "it feels a little better". 2x10   Standing serratus anterior exercise to increase scapular control. Medial forearm on wall, thumbs facing posterior, shoulders and elbows at 90 deg, slide forearms up as high as possible, then slow return to starting position. Pt cued to look at hands throughout movement to facilitate neck flexion and extension ROM. 2x10. Pt reports this feels fine.  Standing DNF exercise to  increase DNF strength/endurance and increase cervical extensor length. Stand in front of wall with tennis ball against the forehead, perform chin tuck, then return to neutral. 2x15. Pt cued to return to neutral instead of raising chin up past neutral. Pt reports this feels fine.  Standing lunge thoracic rotation, one hand on table at waist height, raise other arm out to the side and look at hand throughout movement to increase cervicothoracic ROM and stretch the anterior chest and shoulder musculature. 2x10 each side. Pt reports this feels fine, but is starting to bother her R LB. Discontinued.                            PT Education - 10/04/16 1403    Education provided Yes   Education Details HEP   Person(s) Educated Patient   Methods Explanation;Tactile cues;Demonstration;Verbal cues   Comprehension Verbalized understanding;Returned demonstration;Verbal cues required             PT Long Term Goals - 10/02/16 0851      PT LONG TERM GOAL #1   Title pt will be able to stand 20 min with no c/o pain to return to cooking   Baseline pain with 5 min standing; 07/03/16: 10 minutes   Time 4   Period Weeks   Status Achieved     PT LONG TERM GOAL #2   Title Pt will participate in 6MWT pain free to improve functional gait activity level   Baseline pain with 3 min walking;  07/03/16:    Time 4   Period Weeks   Status Partially Met     PT LONG TERM GOAL #3   Title Pt will be I with HEP to manage pain symptoms using flexion activity   Baseline 07/03/16: not consistent with HEP   Time 4   Period Weeks   Status On-going     PT LONG TERM GOAL #4   Title Pt will decreased mODI by at least 13% in order to demonstrate clinically significant reduction in pain   Baseline 07/03/16: 38%   Time 4   Period Weeks   Status On-going     PT LONG TERM GOAL #5   Title Pt will demo increased cervical ROM from 15 deg B to > 25 deg sidebend and rotation from 30 deg R toand 25 deg L  to > 40 deg B in order to turn neck when diving   Time 12   Period Weeks   Status New     Additional Long Term Goals   Additional Long Term Goals Yes  PT LONG TERM GOAL #6   Title Pt will report decreased neck pain from 3/10 to 0/10 and decreased mm upper trap tensions/tenderness  in order to don/doff jacket   Time 12   Period Weeks   Status New               Plan - 10/04/16 1407    Clinical Impression Statement Pt presented with decreased cervical ROM, espcially with L cervical rotation. No TrP noted in cervicothoracic region. Upper thoracic CPA joint mobs slightly hypomobile, R UPA joint mobs "felt good".  Introduced DNF and thoracic rotation exercises.    Rehab Potential Fair   Clinical Impairments Affecting Rehab Potential medical comorbidities/motivation   PT Frequency 2x / week   PT Duration 4 weeks   PT Treatment/Interventions ADLs/Self Care Home Management;Aquatic Therapy;Manual techniques;Therapeutic exercise;Functional mobility training;Therapeutic activities;Patient/family education   Consulted and Agree with Plan of Care Patient      Patient will benefit from skilled therapeutic intervention in order to improve the following deficits and impairments:  Pain, Improper body mechanics, Difficulty walking, Hypomobility, Decreased strength, Decreased range of motion  Visit Diagnosis: Cervicalgia     Problem List Patient Active Problem List   Diagnosis Date Noted  . Restrictive lung disease 08/06/2016  . Left-sided low back pain with left-sided sciatica 05/01/2016  . Excessive cerumen in right ear canal 05/01/2016  . Routine general medical examination at a health care facility 01/27/2016  . Need for hepatitis C screening test 04/21/2015  . History of colonic polyps   . Benign neoplasm of ascending colon   . Benign neoplasm of transverse colon   . Rash and nonspecific skin eruption 08/30/2014  . Postmenopausal estrogen deficiency 08/30/2014  . Left  Achilles tendinitis 04/23/2014  . Chronic diastolic heart failure ([place]) 04/08/2014  . Obesity (BMI 30-39.9) 04/09/2013  . Diabetes mellitus type 2, controlled ([place]) 01/07/2013  . Edema 01/07/2013  . Medicare annual wellness visit, subsequent 11/28/2012  . Chronic hypoxemic respiratory failure ([place]) 11/11/2012  . Obstructive sleep apnea 11/11/2012  . Dyspnea 08/18/2012  . Fatigue 08/18/2012  . Meralgia paresthetica of left side 08/20/2011  . Myalgia 05/03/2011  . Leg pain, lateral 04/12/2011  . GOUT 12/26/2009  . CARDIOMYOPATHY 10/08/2007  . ALLERGIC RHINITIS, SEASONAL 10/08/2007  . CHOLECYSTECTOMY, HX OF 10/08/2007  . HYPERLIPIDEMIA, MIXED 05/02/2007   Manfred Arch SPT Nyssa Sayegh PT DPT 10/04/2016, 2:21 PM  [facility] [address] [place], [address] Phone: [phone]   Fax:  [phone]  Name: Betty [name] MRN: [id number] Date of Birth: Jul 26, 1946

## 2016-10-08 ENCOUNTER — Ambulatory Visit: Payer: Medicare Other | Admitting: Physical Therapy

## 2016-10-08 DIAGNOSIS — M542 Cervicalgia: Secondary | ICD-10-CM

## 2016-10-08 DIAGNOSIS — R262 Difficulty in walking, not elsewhere classified: Secondary | ICD-10-CM | POA: Diagnosis not present

## 2016-10-08 NOTE — Therapy (Signed)
[facility] [address], [place], [address] Phone: [phone]   Fax:  [phone]  Physical Therapy Treatment  Patient Details  Name: Betty [name] MRN: [id number] Date of Birth: 1946/08/26 Referring Provider: Caryl Bis  Encounter Date: 10/08/2016      PT End of Session - 10/08/16 1151    Visit Number 19   Number of Visits 20   Date for PT Re-Evaluation 12/05/16   Authorization Type g code 6/10   PT Start Time 0949   PT Stop Time 1017   PT Time Calculation (min) 28 min   Activity Tolerance Patient tolerated treatment well   Behavior During Therapy [facility] for tasks assessed/performed      Past Medical History:  Diagnosis Date  . Allergic rhinitis    never tested, fall and spring  . Arthritis    hands,knees, feet  . Cataracts, bilateral    not surgical yet  . Chronic diastolic CHF (congestive heart failure) ([place])   . Chronic respiratory failure ([place])   . Diabetes mellitus without complication ([place])   . Diverticulosis    problems with frequent gas, burping  . Glucose intolerance (impaired glucose tolerance)   . Gout   . History of chicken pox   . Hyperlipidemia   . Hypertension   . NICM (nonischemic cardiomyopathy) ([place]) 2002   EF 25%; improved to normal - echo 4/08: EF 50-55%, mild MR, mild LAE, mild TR;    cath 3/03: normal cors, EF 40%  . Obesity   . PONV (postoperative nausea and vomiting)   . Restrictive lung disease   . Skin cancer    skin cancers only  . Sleep apnea    cpap settings 4    Past Surgical History:  Procedure Laterality Date  . BILATERAL VATS ABLATION Right    biopsy done 2015- [facility]  . BREAST BIOPSY Left 80's   Cyst removed  . CARDIAC CATHETERIZATION    . choleystectomy  02/2002  . COLONOSCOPY WITH PROPOFOL N/A 12/20/2014   Procedure: COLONOSCOPY WITH PROPOFOL;  Surgeon: [name], MD;  Location: [facility];  Service: Gastroenterology;  Laterality: N/A;  .  CYSTECTOMY  1996   l breast  . DILATION AND CURETTAGE OF UTERUS  2011   Dr[facility] Hulan [facility] at [facility]  . [place]    . nsvd     x 2  . TUBAL LIGATION  1975  . VAGINAL DELIVERY     x2    There were no vitals filed for this visit.      Subjective Assessment - 10/08/16 1126    Subjective Pt states she continues to have bilateral neck stiffness with cervical rotation, especially on the L side with L rotation. Pt states she has been doing her open book exercise a few times a day, did not perform the tennis ball DNF exercise.    Pertinent History pain with rolling in bed, laying in bed   How long can you sit comfortably? no problems   How long can you stand comfortably? immediate pain   How long can you walk comfortably? immediate pain   Diagnostic tests MRI - L4-L5 disc herniation   Patient Stated Goals decr. pain, return to exercise    Currently in Pain? No/denies   Pain Score 0-No pain       Objective:  Seated thoracic extension exercise with arms across chest, to increase thoracic extension. Pt cued to keep shoulders down, and look  up to promote cervical extension. 10x Pt appeared to have difficulty facilitating motion from thoracic spine, discontinued.   Standing thoracic rotation wall taps using yellow medicine ball, 10x each side, to increase thoracic rotation. Pt cued on keeping elbows straight and hips facing forward to emphasize cervicothoracic rotation. Pt appeared to have difficulty performing, discontinued.   Standing rows with YTB, to increase scapular retraction and depression to facilitate an upright posture. 2x15 Pt cued to keep elbows bent to 90 deg by sides, and bring elbows back. Pt states this feels fine.   Standing ball roll ups, standing in front of wall with both hands on pink ball, and roll ball up and down the wall while looking at the ball to promote cervicothoracic extension and shoulder flexion. 10x    Reviewed supine open book thoracic rotation  exercise, 10x each side. Pt able to perform with no cues.                           PT Education - 10/08/16 1150    Education provided Yes   Education Details HEP   Person(s) Educated Patient   Methods Explanation;Demonstration;Tactile cues;Verbal cues   Comprehension Verbalized understanding;Returned demonstration;Verbal cues required;Tactile cues required             PT Long Term Goals - 10/02/16 0851      PT LONG TERM GOAL #1   Title pt will be able to stand 20 min with no c/o pain to return to cooking   Baseline pain with 5 min standing; 07/03/16: 10 minutes   Time 4   Period Weeks   Status Achieved     PT LONG TERM GOAL #2   Title Pt will participate in 6MWT pain free to improve functional gait activity level   Baseline pain with 3 min walking;  07/03/16:    Time 4   Period Weeks   Status Partially Met     PT LONG TERM GOAL #3   Title Pt will be I with HEP to manage pain symptoms using flexion activity   Baseline 07/03/16: not consistent with HEP   Time 4   Period Weeks   Status On-going     PT LONG TERM GOAL #4   Title Pt will decreased mODI by at least 13% in order to demonstrate clinically significant reduction in pain   Baseline 07/03/16: 38%   Time 4   Period Weeks   Status On-going     PT LONG TERM GOAL #5   Title Pt will demo increased cervical ROM from 15 deg B to > 25 deg sidebend and rotation from 30 deg R toand 25 deg L to > 40 deg B in order to turn neck when diving   Time 12   Period Weeks   Status New     Additional Long Term Goals   Additional Long Term Goals Yes     PT LONG TERM GOAL #6   Title Pt will report decreased neck pain from 3/10 to 0/10 and decreased mm upper trap tensions/tenderness  in order to don/doff jacket   Time 12   Period Weeks   Status New               Plan - 10/08/16 1153    Clinical Impression Statement Pt presents with bilateral neck stiffness, slight tenderness at T2 paraspinals, no  pain. Introduced cervicothoracic extension and rotation exercises, and scapular retraction with YTB to faciliate  improved posture. Pt required extensive cuing with thoracic exercises. At next visit reassess cervical and thoracic joint PAM and introduce cervical retraction for [facility].    Rehab Potential Fair   Clinical Impairments Affecting Rehab Potential medical comorbidities/motivation   PT Frequency 2x / week   PT Duration 4 weeks   PT Treatment/Interventions ADLs/Self Care Home Management;Aquatic Therapy;Manual techniques;Therapeutic exercise;Functional mobility training;Therapeutic activities;Patient/family education   Consulted and Agree with Plan of Care Patient      Patient will benefit from skilled therapeutic intervention in order to improve the following deficits and impairments:  Pain, Improper body mechanics, Difficulty walking, Hypomobility, Decreased strength, Decreased range of motion  Visit Diagnosis: Cervicalgia     Problem List Patient Active Problem List   Diagnosis Date Noted  . Restrictive lung disease 08/06/2016  . Left-sided low back pain with left-sided sciatica 05/01/2016  . Excessive cerumen in right ear canal 05/01/2016  . Routine general medical examination at a health care facility 01/27/2016  . Need for hepatitis C screening test 04/21/2015  . History of colonic polyps   . Benign neoplasm of ascending colon   . Benign neoplasm of transverse colon   . Rash and nonspecific skin eruption 08/30/2014  . Postmenopausal estrogen deficiency 08/30/2014  . Left Achilles tendinitis 04/23/2014  . Chronic diastolic heart failure ([place]) 04/08/2014  . Obesity (BMI 30-39.9) 04/09/2013  . Diabetes mellitus type 2, controlled ([place]) 01/07/2013  . Edema 01/07/2013  . Medicare annual wellness visit, subsequent 11/28/2012  . Chronic hypoxemic respiratory failure ([place]) 11/11/2012  . Obstructive sleep apnea 11/11/2012  . Dyspnea 08/18/2012  . Fatigue 08/18/2012  . Meralgia  paresthetica of left side 08/20/2011  . Myalgia 05/03/2011  . Leg pain, lateral 04/12/2011  . GOUT 12/26/2009  . CARDIOMYOPATHY 10/08/2007  . ALLERGIC RHINITIS, SEASONAL 10/08/2007  . CHOLECYSTECTOMY, HX OF 10/08/2007  . HYPERLIPIDEMIA, MIXED 05/02/2007   Manfred Arch, SPT Yovani Cogburn PT DPT 10/08/2016, 2:08 PM  [facility] [address] [place], [address] Phone: [phone]   Fax:  [phone]  Name: Betty [name] MRN: [id number] Date of Birth: 05-06-1946

## 2016-10-11 ENCOUNTER — Other Ambulatory Visit (HOSPITAL_COMMUNITY): Payer: Self-pay | Admitting: Internal Medicine

## 2016-10-11 ENCOUNTER — Telehealth: Payer: Self-pay

## 2016-10-11 ENCOUNTER — Encounter: Payer: Medicare Other | Admitting: Physical Therapy

## 2016-10-11 ENCOUNTER — Other Ambulatory Visit: Payer: Self-pay | Admitting: Family Medicine

## 2016-10-11 NOTE — Telephone Encounter (Signed)
-----   Message from Juanito Doom, MD sent at 10/11/2016  3:25 AM EST ----- A, Her ONO on 2L [place] showed that her O2 level dropped to a low level for about an hour, she should increase her O2 flow to 3L [place] qHS. Thanks B

## 2016-10-11 NOTE — Telephone Encounter (Signed)
lmtcb X1 to relay results/recs

## 2016-10-11 NOTE — Telephone Encounter (Signed)
Pt called back, aware of results/recs.  Nothing further needed.  

## 2016-10-12 ENCOUNTER — Ambulatory Visit (HOSPITAL_COMMUNITY)
Admission: RE | Admit: 2016-10-12 | Discharge: 2016-10-12 | Disposition: A | Discharge status: Home / Self Care | Payer: Medicare Other | Source: Ambulatory Visit | Attending: Internal Medicine | Admitting: Internal Medicine

## 2016-10-12 VITALS — BP 120/60 | HR 84 | Wt 189.0 lb

## 2016-10-12 DIAGNOSIS — Z882 Allergy status to sulfonamides status: Secondary | ICD-10-CM | POA: Diagnosis not present

## 2016-10-12 DIAGNOSIS — Z85828 Personal history of other malignant neoplasm of skin: Secondary | ICD-10-CM | POA: Diagnosis not present

## 2016-10-12 DIAGNOSIS — Z9981 Dependence on supplemental oxygen: Secondary | ICD-10-CM | POA: Diagnosis not present

## 2016-10-12 DIAGNOSIS — I110 Hypertensive heart disease with heart failure: Secondary | ICD-10-CM | POA: Insufficient documentation

## 2016-10-12 DIAGNOSIS — M109 Gout, unspecified: Secondary | ICD-10-CM | POA: Insufficient documentation

## 2016-10-12 DIAGNOSIS — R0609 Other forms of dyspnea: Secondary | ICD-10-CM | POA: Diagnosis not present

## 2016-10-12 DIAGNOSIS — I429 Cardiomyopathy, unspecified: Secondary | ICD-10-CM | POA: Diagnosis not present

## 2016-10-12 DIAGNOSIS — J9611 Chronic respiratory failure with hypoxia: Secondary | ICD-10-CM | POA: Insufficient documentation

## 2016-10-12 DIAGNOSIS — Z7982 Long term (current) use of aspirin: Secondary | ICD-10-CM | POA: Insufficient documentation

## 2016-10-12 DIAGNOSIS — Z6831 Body mass index (BMI) 31.0-31.9, adult: Secondary | ICD-10-CM | POA: Insufficient documentation

## 2016-10-12 DIAGNOSIS — G4733 Obstructive sleep apnea (adult) (pediatric): Secondary | ICD-10-CM | POA: Insufficient documentation

## 2016-10-12 DIAGNOSIS — J984 Other disorders of lung: Secondary | ICD-10-CM

## 2016-10-12 DIAGNOSIS — E669 Obesity, unspecified: Secondary | ICD-10-CM

## 2016-10-12 DIAGNOSIS — E785 Hyperlipidemia, unspecified: Secondary | ICD-10-CM | POA: Diagnosis not present

## 2016-10-12 DIAGNOSIS — I5032 Chronic diastolic (congestive) heart failure: Secondary | ICD-10-CM | POA: Diagnosis not present

## 2016-10-12 DIAGNOSIS — E119 Type 2 diabetes mellitus without complications: Secondary | ICD-10-CM | POA: Insufficient documentation

## 2016-10-12 DIAGNOSIS — Z880 Allergy status to penicillin: Secondary | ICD-10-CM | POA: Insufficient documentation

## 2016-10-12 NOTE — Patient Instructions (Signed)
1. Continue current weight loss program. 2. Check O2 sats at home at rest AND with exertion. Wear oxygen as needed to keep sats up to 90% 3. Refer back to Pulmonary Rehab in [place] 4. F/u with Dr. [place] Bells for PFTS with DLCO and further direction  5. Take extra lasix as needed for fluid build up 6. Return to Clinic in 6 months or sooner as needed

## 2016-10-12 NOTE — Progress Notes (Signed)
Patient ID: Betty [name], female   DOB: 04-Jun-1946, 71 y.o.   MRN: [id number]    Advanced Heart Failure Clinic Note   Date:  10/12/2016   ID:  Betty [name], Betty [name] 09-21-1945, MRN [id number]  PCP:  Tommi Rumps, MD  Cardiologist:  Dr. Dorris Carnes      History of Present Illness: Betty [name] is a 71 y.o. female with a hx of NICM with previous EF 25% (improved to normal), HL, gout, glucose intol, normal cors by [facility] in 2003, OSA. Referred by Dr. Harrington Challenger for further evaluation of exertional dyspnea.   She developed increasing shortness of breath over the course of 2 years about 2011 to 2013. She had an extensive Pulmonary work-up at [facility] and has been seen by Dr. Lake Bells. Pulmonary function testing showed restrictive lung disease with a markedly depressed DLCO at 35% predicted. CT scanning of her chest showed upper lobe groundglass abnormalities versus air trapping. A home polysomnogram showed an AHI of 12 and multiple desaturation events to 80% independent of apneas. An abg showed resting hypercarbia (pCO2 52), and a MIP/MEP was 36%/28% predicted. A follow up diaphragm study was normal. A repeat CT scan was performed, no pulmonary embolism was seen and radiology commented on atelectasis but no clear intersitial lung disease. Blood work including an HP panel and serologies for connective tissue diseases has been essentially negative (two partial aspergillus bands on HP panel). Two echocardiograms have not shown evidence of pulmonary hypertension. After starting on CPAP with nasal pillows and oxygen with exertion and sleep she started feeling better.  She had an open lung biopsy at [facility] in November of 2014 which showed small amounts of fibrosis around her pulmonary arteries as well as findings consistent with very mild bronchiolitis.  Resting RHC was normal 12/2012 Referred to [facility] (Dr. Gilles Chiquito).  RHC with exercise in 3/15 showed marked increase in pulmonary pressures and PCWP  with exercise with no change in PVR. O2 sats down to 77% with exercise. Corrected with O2. PA rest 36/20 (26)  PCWP 15 with exercise PA 78/40 (56) PCWP 28  This was felt to reflect diastolic dysfunction and intrinsic lung disease.  Lung bx was not suggestive of clear lung pathology.  CT was neg for PE.  Continued management of diastolic CHF has been recommended.     She returns today for follow up. She has previously had a very thorough cardiopulmonary work up as above. Her dyspnea was felt to be primarily related to restrictive lung disease and severe diastolic dysfunction with exertional pulmonary HTN due to elevated left sided pressures with a normal PVR. At last visit we checked O2 sats with ambulation and dropped rapidly to 82-83% with ambulation. We suggested she use her home O2 more regularly. Recently had repeat sleep study and told to increase her nighttime O2. Has f/u with Dr. Lake Bells. Has PFTs scheduled in March. Does check her O2 sats at home but only at rest and typically 92%. Breathing a bite better. Able to do ADLs without too much problem. No orthopnea or PND. She said she has lost 10 pounds and breathing really improved.  Echo 08/06/16 LVEF 55-60%, Grade 1 DD, Mild TR, PA pressures WNL  Studies:  - RHC (5/14):  RA 9, RV 32/8, PA 30/16/23, PCWP 12, 2.1 WU, CO 5.3, CI 2.7  - Echo (3/14):  EF 55-60%, no RWMA, Gr 1 DD, mild MR  - Nuclear (11/13):  Breast atten, no ischemia, EF 55%; Low  Risk   - PFTs. [place] 2014: FVC 1.46 L (48% pred), TLC 2.77 L (55% pred) ERV 0.03 (3% pred), DLCO 8.4 (35% pred)  Recent Labs/Images: 01/27/2016: Direct LDL 60.0; HDL 67.40 07/30/2016: ALT 21; Creatinine, Ser 0.64; Hemoglobin 13.7; Potassium 4.0; Pro B Natriuretic peptide (BNP) 8.0; TSH 2.42   Wt Readings from Last 3 Encounters:  10/12/16 189 lb (85.7 kg)  09/21/16 192 lb (87.1 kg)  08/06/16 197 lb 9.6 oz (89.6 kg)     Past Medical History:  Diagnosis Date  . Allergic rhinitis    never tested, fall  and spring  . Arthritis    hands,knees, feet  . Cataracts, bilateral    not surgical yet  . Chronic diastolic CHF (congestive heart failure) ([place])   . Chronic respiratory failure ([place])   . Diabetes mellitus without complication ([place])   . Diverticulosis    problems with frequent gas, burping  . Glucose intolerance (impaired glucose tolerance)   . Gout   . History of chicken pox   . Hyperlipidemia   . Hypertension   . NICM (nonischemic cardiomyopathy) ([place]) 2002   EF 25%; improved to normal - echo 4/08: EF 50-55%, mild MR, mild LAE, mild TR;    cath 3/03: normal cors, EF 40%  . Obesity   . PONV (postoperative nausea and vomiting)   . Restrictive lung disease   . Skin cancer    skin cancers only  . Sleep apnea    cpap settings 4    Current Outpatient Prescriptions  Medication Sig Dispense Refill  . aspirin 81 MG EC tablet Take 81 mg by mouth daily.     . Calcium Citrate-Vitamin D 200-125 MG-UNIT TABS Take 250 Units by mouth 2 (two) times daily.     . carvedilol (COREG) 12.5 MG tablet TAKE 1 TABLET BY MOUTH TWICE A DAY WITH A MEAL 180 tablet 1  . Cholecalciferol (VITAMIN D-3) 1000 UNITS CAPS Take 1,000 Units by mouth 2 (two) times daily. GUMMIES    . Coenzyme Q10 (CO Q 10) 100 MG CAPS Take 100 mg by mouth at bedtime.     . Fish Oil OIL Take 227 mcg by mouth 2 (two) times daily. GUMMIES    . furosemide (LASIX) 40 MG tablet Take 1 tablet (40 mg total) by mouth daily. Take extra tablet once daily as needed for weight gain or swelling. 180 tablet 3  . KLOR-CON M20 20 MEQ tablet TAKE 2 TABLETS EVERY MORNING AND 1 TABLET EACH EVENING 90 tablet 2  . losartan (COZAAR) 50 MG tablet TAKE 0.5 TABLETS (25 MG TOTAL) BY MOUTH DAILY. 90 tablet 0  . multivitamin (THERAGRAN) per tablet Take 1 tablet by mouth daily.     . polyvinyl alcohol (LIQUIFILM TEARS) 1.4 % ophthalmic solution Place 1 drop into both eyes every morning.    . simvastatin (ZOCOR) 20 MG tablet TAKE 1 TABLET BY MOUTH AT BEDTIME. 90  tablet 3   No current facility-administered medications for this encounter.    Allergies:   Amoxicillin; Etodolac; Hctz [hydrochlorothiazide]; Meloxicam; Metformin and related; and Sulfonamide derivatives   Social History:  The patient  reports that she has never smoked. She has never used smokeless tobacco. She reports that she does not drink alcohol or use drugs.   Family History:  The patient's family history includes COPD in her father; Cancer in her maternal grandfather and maternal grandmother; Cancer (age of onset: 24) in her mother; Depression in her sister; Diabetes in her father, paternal  grandfather, and sister; Heart attack in her father; Heart disease in her father and paternal grandfather; Hyperlipidemia in her father; Hypertension in her brother and father; Lymphoma in her mother; Meniere's disease in her brother; Pneumonia in her father; Schizophrenia in her daughter; Stroke in her father.   ROS:  Please see the history of present illness.  All other systems reviewed and negative.   PHYSICAL EXAM: VS:  BP 120/60 (BP Location: Right Arm, Patient Position: Sitting, Cuff Size: Normal)   Pulse 84   Wt 189 lb (85.7 kg)   SpO2 91%   BMI 31.45 kg/m    General: Pleasant, NAD. No resp difficulty Psych: Normal affect. HEENT: Normal, without mass or lesion. Neck: Supple, no bruits or JVD. Carotids 2+. No lymphadenopathy/thyromegaly appreciated. Heart: PMI nondisplaced. RRR no s3, s4, or murmurs. Trace edema Lungs: Resp regular and unlabored, CTA. Abdomen: Obese Soft, NT, ND, no HSM. No bruits or masses. +BS  Extremities: No clubbing, cyanosis or edema. DP/PT/Radials 2+ and equal bilaterally. Neuro: Alert and oriented X 3. Moves all extremities spontaneously.   ASSESSMENT AND PLAN:  1. Dyspnea 2. Chronic hypoxic respiratory failure on home O2 and CPAP  3. Chronic diastolic heart failure   4. Restrictive lung physiology 5. Obesity 6. HTN     --well  controlled Overall much improved with weight loss. Feeling better and more active. I have recommended the following:  1. Continue current weight loss program. 2. Check O2 sats at home at rest AND with exertion. Wear oxygen as needed to keep sats up to 90% 3. Refer back to Pulmonary Rehab in [place] 4. F/u with Dr. Lake Bells for PFTS with DLCO and further direction  5. Take extra lasix as needed for fluid build up 6. Return to Clinic in 6 months or sooner as needed  Glori Bickers, MD 10/12/2016

## 2016-10-12 NOTE — Addendum Note (Signed)
Encounter addended by: Kerry Dory, CMA on: 10/12/2016 10:12 AM<BR>    Actions taken: Diagnosis association updated, Order list changed

## 2016-10-12 NOTE — Progress Notes (Signed)
Advanced Heart Failure Medication Review by a Pharmacist  Does the patient  feel that his/her medications are working for him/her?  yes  Has the patient been experiencing any side effects to the medications prescribed?  no  Does the patient measure his/her own blood pressure or blood glucose at home?  no   Does the patient have any problems obtaining medications due to transportation or finances?   no  Understanding of regimen: good Understanding of indications: good Potential of compliance: good Patient understands to avoid NSAIDs. Patient understands to avoid decongestants.  Issues to address at subsequent visits: None   Pharmacist comments: Betty [name] is a pleasant 71 yo F presenting without a medication list but with good recall of her regimen. She reports good compliance and did not have any specific medication-related questions or concerns for me at this time.   [name]. Velva Harman, PharmD, BCPS, CPP Clinical Pharmacist Pager: [phone] Phone: [phone] 10/12/2016 9:24 AM      Time with patient: 10 minutes Preparation and documentation time: 2 minutes Total time: 12 minutes

## 2016-10-15 NOTE — Addendum Note (Signed)
Encounter addended by: Kerry Dory, CMA on: 10/15/2016 11:51 AM<BR>    Actions taken: Order list changed

## 2016-10-16 ENCOUNTER — Other Ambulatory Visit: Payer: Self-pay | Admitting: *Deleted

## 2016-10-16 DIAGNOSIS — I5032 Chronic diastolic (congestive) heart failure: Secondary | ICD-10-CM

## 2016-10-18 ENCOUNTER — Ambulatory Visit: Payer: Medicare Other | Admitting: Physical Therapy

## 2016-10-18 DIAGNOSIS — M542 Cervicalgia: Secondary | ICD-10-CM

## 2016-10-18 DIAGNOSIS — R262 Difficulty in walking, not elsewhere classified: Secondary | ICD-10-CM | POA: Diagnosis not present

## 2016-10-18 NOTE — Therapy (Signed)
[place] [address], [place], [address] Phone: [phone]   Fax:  [phone]  Physical Therapy Treatment/Discharge  Patient Details  Name: Betty [name] MRN: [id number] Date of Birth: Jan 13, 1946 Referring Provider: Caryl Bis  Encounter Date: 10/18/2016      PT End of Session - 10/18/16 1048    Visit Number 20   Number of Visits 20   Date for PT Re-Evaluation 12/05/16   Authorization Type g code 6/10   PT Start Time 1019   PT Stop Time 1047   PT Time Calculation (min) 28 min   Activity Tolerance Patient tolerated treatment well   Behavior During Therapy [facility] for tasks assessed/performed      Past Medical History:  Diagnosis Date  . Allergic rhinitis    never tested, fall and spring  . Arthritis    hands,knees, feet  . Cataracts, bilateral    not surgical yet  . Chronic diastolic CHF (congestive heart failure) ([place])   . Chronic respiratory failure ([place])   . Diabetes mellitus without complication ([place])   . Diverticulosis    problems with frequent gas, burping  . Glucose intolerance (impaired glucose tolerance)   . Gout   . History of chicken pox   . Hyperlipidemia   . Hypertension   . NICM (nonischemic cardiomyopathy) ([place]) 2002   EF 25%; improved to normal - echo 4/08: EF 50-55%, mild MR, mild LAE, mild TR;    cath 3/03: normal cors, EF 40%  . Obesity   . PONV (postoperative nausea and vomiting)   . Restrictive lung disease   . Skin cancer    skin cancers only  . Sleep apnea    cpap settings 4    Past Surgical History:  Procedure Laterality Date  . BILATERAL VATS ABLATION Right    biopsy done 2015- [facility]  . BREAST BIOPSY Left 80's   Cyst removed  . CARDIAC CATHETERIZATION    . choleystectomy  02/2002  . COLONOSCOPY WITH PROPOFOL N/A 12/20/2014   Procedure: COLONOSCOPY WITH PROPOFOL;  Surgeon: [name], MD;  Location: [facility] ENDOSCOPY;  Service: Gastroenterology;  Laterality:  N/A;  . CYSTECTOMY  1996   l breast  . DILATION AND CURETTAGE OF UTERUS  2011   Dr. Hulan Fray at [facility]  . [place]    . nsvd     x 2  . TUBAL LIGATION  1975  . VAGINAL DELIVERY     x2    There were no vitals filed for this visit.      Subjective Assessment - 10/18/16 1024    Subjective Pt states that she feels much better, feels some tightness at end range rotation, but has less stiffness and feels like "I can move better". Pt "I went to [place] and did not have to use a buggy because I felt comfortable without it".    Pertinent History pain with rolling in bed, laying in bed   How long can you sit comfortably? no problems   How long can you stand comfortably? immediate pain   How long can you walk comfortably? immediate pain   Diagnostic tests MRI - L4-L5 disc herniation   Patient Stated Goals decr. pain, return to exercise    Currently in Pain? No/denies   Pain Score 0-No pain       Objective:  Thoracic rotation mobilization with movement in both directions, 10 min, to increase bilateral cervicothoracic rotation and decrease muscle tension.  Pt seated with feet on floor, crossed arms over chest and rotated to L side, mobilized midthoracic spine to facilitate L rotation. Placed other hand on R elbow and instructed pt to "gently push into my hand" for a contract-relax technique to facilitate increase L rotation and decrease muscle tension. Repeated with R thoracic rotation. Reassessed, improved thoracic and cervical rotation bilaterally, pt reports "I feel like I can go farther".   Arm pulley, standing row exercise, to increase thoracic rotation and scapular retraction. Pt standing with pulleys at chest level, pt brings R arm back into a row and rotate to the R, return to neutral, and repeat on the other side. Provided tactile cues to keep shoulders down. 5x each side. Pt reports no pain or tightness.                           PT Education -  10/18/16 1047    Education provided Yes   Education Details HEP, DC plan   Person(s) Educated Patient   Methods Explanation;Demonstration;Tactile cues;Verbal cues   Comprehension Verbalized understanding             PT Long Term Goals - 10/18/16 1106      PT LONG TERM GOAL #1   Title pt will be able to stand 20 min with no c/o pain to return to cooking   Baseline pain with 5 min standing; 07/03/16: 10 minutes   Time 4   Period Weeks   Status Achieved     PT LONG TERM GOAL #2   Title Pt will participate in 6MWT pain free to improve functional gait activity level   Baseline pain with 3 min walking;  07/03/16:    Time 4   Period Weeks   Status Partially Met     PT LONG TERM GOAL #3   Title Pt will be I with HEP to manage pain symptoms using flexion activity   Baseline 07/03/16: not consistent with HEP   Time 4   Period Weeks   Status On-going     PT LONG TERM GOAL #4   Title Pt will decreased mODI by at least 13% in order to demonstrate clinically significant reduction in pain   Baseline 07/03/16: 38%   Time 4   Period Weeks   Status On-going     PT LONG TERM GOAL #5   Title Pt will demo increased cervical ROM from 15 deg B to > 25 deg sidebend and rotation from 30 deg R toand 25 deg L to > 40 deg B in order to turn neck when diving   Time 12   Period Weeks   Status Achieved     PT LONG TERM GOAL #6   Title Pt will report decreased neck pain from 3/10 to 0/10 and decreased mm upper trap tensions/tenderness  in order to don/doff jacket   Time 12   Period Weeks   Status Achieved               Plan - 10/18/16 1050    Clinical Impression Statement Pt presents with decreased neck stiffness, and increased cervical rotation. NDI 6 Performed mobilization with movement which increased cervicothoracic rotation and decreased neck stiffness at end range. Pt has met goal to be able to perform ADLs and social activities pain-free and with no limitations. Will D/C pt.     Rehab Potential Fair   Clinical Impairments Affecting Rehab Potential medical comorbidities/motivation   PT  Frequency 2x / week   PT Duration 4 weeks   PT Treatment/Interventions ADLs/Self Care Home Management;Aquatic Therapy;Manual techniques;Therapeutic exercise;Functional mobility training;Therapeutic activities;Patient/family education   Consulted and Agree with Plan of Care Patient      Patient will benefit from skilled therapeutic intervention in order to improve the following deficits and impairments:  Pain, Improper body mechanics, Difficulty walking, Hypomobility, Decreased strength, Decreased range of motion  Visit Diagnosis: Cervicalgia     Problem List Patient Active Problem List   Diagnosis Date Noted  . Restrictive lung disease 08/06/2016  . Left-sided low back pain with left-sided sciatica 05/01/2016  . Excessive cerumen in right ear canal 05/01/2016  . Routine general medical examination at a health care facility 01/27/2016  . Need for hepatitis C screening test 04/21/2015  . History of colonic polyps   . Benign neoplasm of ascending colon   . Benign neoplasm of transverse colon   . Rash and nonspecific skin eruption 08/30/2014  . Postmenopausal estrogen deficiency 08/30/2014  . Left Achilles tendinitis 04/23/2014  . Chronic diastolic heart failure ([place]) 04/08/2014  . Obesity (BMI 30-39.9) 04/09/2013  . Diabetes mellitus type 2, controlled ([place]) 01/07/2013  . Edema 01/07/2013  . Medicare annual wellness visit, subsequent 11/28/2012  . Chronic hypoxemic respiratory failure ([place]) 11/11/2012  . Obstructive sleep apnea 11/11/2012  . Dyspnea 08/18/2012  . Fatigue 08/18/2012  . Meralgia paresthetica of left side 08/20/2011  . Myalgia 05/03/2011  . Leg pain, lateral 04/12/2011  . GOUT 12/26/2009  . CARDIOMYOPATHY 10/08/2007  . ALLERGIC RHINITIS, SEASONAL 10/08/2007  . CHOLECYSTECTOMY, HX OF 10/08/2007  . HYPERLIPIDEMIA, MIXED 05/02/2007   Manfred Arch,  SPT Fisher,Benjamin PT DPT 10/18/2016, 11:09 AM  [facility] [address] [place], [address] Phone: [phone]   Fax:  [phone]  Name: Betty [name] MRN: [id number] Date of Birth: [birth year]

## 2016-10-25 ENCOUNTER — Encounter: Payer: Medicare Other | Admitting: Physical Therapy

## 2016-10-30 ENCOUNTER — Encounter: Payer: Medicare Other | Admitting: Physical Therapy

## 2016-11-13 ENCOUNTER — Encounter: Payer: Medicare Other | Attending: Internal Medicine | Admitting: *Deleted

## 2016-11-13 VITALS — Ht 65.75 in | Wt 186.3 lb

## 2016-11-13 DIAGNOSIS — I110 Hypertensive heart disease with heart failure: Secondary | ICD-10-CM | POA: Insufficient documentation

## 2016-11-13 DIAGNOSIS — I5032 Chronic diastolic (congestive) heart failure: Secondary | ICD-10-CM | POA: Diagnosis present

## 2016-11-13 DIAGNOSIS — E118 Type 2 diabetes mellitus with unspecified complications: Secondary | ICD-10-CM | POA: Insufficient documentation

## 2016-11-13 DIAGNOSIS — J9610 Chronic respiratory failure, unspecified whether with hypoxia or hypercapnia: Secondary | ICD-10-CM | POA: Diagnosis not present

## 2016-11-13 DIAGNOSIS — E785 Hyperlipidemia, unspecified: Secondary | ICD-10-CM | POA: Diagnosis not present

## 2016-11-13 NOTE — Progress Notes (Signed)
Pulmonary Individual Treatment Plan  Patient Details  Name: Betty [name] MRN: [id number] Date of Birth: [birth year] Referring Provider:     Pulmonary Rehab from 11/13/2016 in [facility] Cardiac and Pulmonary Rehab  Referring Provider  Bensimhon      Initial Encounter Date:    Pulmonary Rehab from 11/13/2016 in [facility] Cardiac and Pulmonary Rehab  Date  11/13/16  Referring Provider  Bensimhon      Visit Diagnosis: Heart failure, diastolic, chronic ([place])  Patient's Home Medications on Admission:  Current Outpatient Prescriptions:    aspirin 81 MG EC tablet, Take 81 mg by mouth daily. , Disp: , Rfl:    carvedilol (COREG) 12.5 MG tablet, TAKE 1 TABLET BY MOUTH TWICE A DAY WITH A MEAL, Disp: 180 tablet, Rfl: 1   Coenzyme Q10 (CO Q 10) 100 MG CAPS, Take 100 mg by mouth at bedtime. , Disp: , Rfl:    Fish Oil OIL, Take 227 mcg by mouth 2 (two) times daily. GUMMIES, Disp: , Rfl:    furosemide (LASIX) 40 MG tablet, Take 1 tablet (40 mg total) by mouth daily. Take extra tablet once daily as needed for weight gain or swelling., Disp: 180 tablet, Rfl: 3   KLOR-CON M20 20 MEQ tablet, TAKE 2 TABLETS EVERY MORNING AND 1 TABLET EACH EVENING, Disp: 90 tablet, Rfl: 2   losartan (COZAAR) 50 MG tablet, TAKE 0.5 TABLETS (25 MG TOTAL) BY MOUTH DAILY., Disp: 90 tablet, Rfl: 0   multivitamin (THERAGRAN) per tablet, Take 1 tablet by mouth daily. , Disp: , Rfl:    OVER THE COUNTER MEDICATION, Take 2 capsules by mouth 2 (two) times daily., Disp: , Rfl:    polyvinyl alcohol (LIQUIFILM TEARS) 1.4 % ophthalmic solution, Place 1 drop into both eyes every morning., Disp: , Rfl:    simvastatin (ZOCOR) 20 MG tablet, TAKE 1 TABLET BY MOUTH AT BEDTIME., Disp: 90 tablet, Rfl: 3   Calcium Citrate-Vitamin D 200-125 MG-UNIT TABS, Take 250 Units by mouth 2 (two) times daily. , Disp: , Rfl:    Cholecalciferol (VITAMIN D-3) 1000 UNITS CAPS, Take 1,000 Units by mouth 2 (two) times daily. GUMMIES, Disp: , Rfl:   Past  Medical History: Past Medical History:  Diagnosis Date   Allergic rhinitis    never tested, fall and spring   Arthritis    hands,knees, feet   Cataracts, bilateral    not surgical yet   Chronic diastolic CHF (congestive heart failure) ([place])    Chronic respiratory failure ([place])    Diabetes mellitus without complication ([place])    Diverticulosis    problems with frequent gas, burping   Glucose intolerance (impaired glucose tolerance)    Gout    History of chicken pox    Hyperlipidemia    Hypertension    NICM (nonischemic cardiomyopathy) ([place]) 2002   EF 25%; improved to normal - echo 4/08: EF 50-55%, mild MR, mild LAE, mild TR;    cath 3/03: normal cors, EF 40%   Obesity    PONV (postoperative nausea and vomiting)    Restrictive lung disease    Skin cancer    skin cancers only   Sleep apnea    cpap settings 4    Tobacco Use: History  Smoking Status   Never Smoker  Smokeless Tobacco   Never Used    Labs: Recent Review Flowsheet Data    Labs for ITP Cardiac and Pulmonary Rehab Latest Ref Rng & Units 08/30/2014 01/26/2015 04/21/2015 01/27/2016 07/30/2016   Cholestrol 0 -  200 mg/dL 146 151 - 180 -   LDLCALC 0 - 99 mg/dL - - - - -   LDLDIRECT mg/dL 54.4 53.0 - 60.0 -   HDL >39.00 mg/dL 55.30 60.60 - 67.40 -   Trlycerides 0.0 - 149.0 mg/dL 293.0(H) 230.0(H) - 366.0(H) -   Hemoglobin A1c 4.6 - 6.5 % 6.9(H) 6.4 6.7(H) 6.1 6.8(H)   HCO3 20.0 - 24.0 mEq/L - - - - -   TCO2 0 - 100 mmol/L - - - - -   O2SAT % - - - - -       ADL UCSD:     Pulmonary Assessment Scores    Row Name 11/13/16 1222         ADL UCSD   ADL Phase Entry     SOB Score total 18     Rest 0     Walk 1     Stairs 3     Bath 0     Dress 0     Shop 2        Pulmonary Function Assessment:   Exercise Target Goals: Date: 11/13/16  Exercise Program Goal: Individual exercise prescription set with THRR, safety & activity barriers. Participant demonstrates ability to understand and  report RPE using BORG scale, to self-measure pulse accurately, and to acknowledge the importance of the exercise prescription.  Exercise Prescription Goal: Starting with aerobic activity 30 plus minutes a day, 3 days per week for initial exercise prescription. Provide home exercise prescription and guidelines that participant acknowledges understanding prior to discharge.  Activity Barriers & Risk Stratification:   6 Minute Walk:     6 Minute Walk    Row Name 11/13/16 1232         6 Minute Walk   Distance 1380 feet     Walk Time 6 minutes     # of Rest Breaks 0     MPH 2.61     METS 3.14     RPE 11     Perceived Dyspnea  1     VO2 Peak 11.02     Symptoms No     Resting HR 81 bpm     Resting BP 108/62     Max Ex. HR 118 bpm     Max Ex. BP 138/64       Interval HR   Baseline HR 81     1 Minute HR 103     2 Minute HR 110     3 Minute HR 118     4 Minute HR 118     5 Minute HR 118     6 Minute HR 116     Interval Heart Rate? Yes       Interval Oxygen   Interval Oxygen? Yes     Baseline Oxygen Saturation % 94 %     1 Minute Oxygen Saturation % 94 %     2 Minute Oxygen Saturation % 91 %     3 Minute Oxygen Saturation % 88 %     4 Minute Oxygen Saturation % 88 %     5 Minute Oxygen Saturation % 88 %     6 Minute Oxygen Saturation % 91 %       Oxygen Initial Assessment:     Oxygen Initial Assessment - 11/13/16 1147      Home Oxygen   Sleep Oxygen Prescription CPAP;Continuous   Liters per minute 3   Home Exercise Oxygen Prescription Continuous  as needed if pulse oximetry is below 90%   Liters per minute 3   Home at Rest Exercise Oxygen Prescription Continuous  as needed if pulse oximetry is below 90%   Liters per minute 3   Compliance with Home Oxygen Use Yes     Initial 6 min Walk   Oxygen Used None     Program Oxygen Prescription   Program Oxygen Prescription E-Tanks   Liters per minute 3   Comments as needed for oxygen saturation below 88%      Intervention   Short Term Goals To learn and exhibit compliance with exercise, home and travel O2 prescription;To learn and understand importance of monitoring SPO2 with pulse oximeter and demonstrate accurate use of the pulse oximeter.;To Learn and understand importance of maintaining oxygen saturations>88%;To learn and demonstrate proper purse lipped breathing techniques or other breathing techniques.;To learn and demonstrate proper use of respiratory medications   Long  Term Goals Exhibits compliance with exercise, home and travel O2 prescription;Verbalizes importance of monitoring SPO2 with pulse oximeter and return demonstration;Maintenance of O2 saturations>88%;Exhibits proper breathing techniques, such as purse lipped breathing or other method taught during program session;Compliance with respiratory medication;Demonstrates proper use of MDIs      Oxygen Re-Evaluation:   Oxygen Discharge (Final Oxygen Re-Evaluation):   Initial Exercise Prescription:     Initial Exercise Prescription - 11/13/16 1200      Date of Initial Exercise RX and Referring Provider   Date 11/13/16   Referring Provider Bensimhon     Treadmill   MPH 2.4   Grade 0.5   Minutes 15   METs 3     REL-XR   Level 2   Speed 50   Minutes 15   METs 3     Biostep-RELP   Level 3   SPM 50   Minutes 15   METs 3     Prescription Details   Frequency (times per week) 3   Duration Progress to 45 minutes of aerobic exercise without signs/symptoms of physical distress     Intensity   THRR 40-80% of Max Heartrate 109-137   Ratings of Perceived Exertion 11-15   Perceived Dyspnea 0-4     Progression   Progression Continue to progress workloads to maintain intensity without signs/symptoms of physical distress.     Resistance Training   Training Prescription Yes   Weight 2   Reps 10-15      Perform Capillary Blood Glucose checks as needed.  Exercise Prescription Changes:   Exercise  Comments:   Exercise Goals and Review:   Exercise Goals Re-Evaluation :   Discharge Exercise Prescription (Final Exercise Prescription Changes):   Nutrition:  Target Goals: Understanding of nutrition guidelines, daily intake of sodium 1500mg , cholesterol 200mg , calories 30% from fat and 7% or less from saturated fats, daily to have 5 or more servings of fruits and vegetables.  Biometrics:     Pre Biometrics - 11/13/16 1232      Pre Biometrics   Height 5' 5.75" (1.67 m)   Weight 186 lb 4.8 oz (84.5 kg)   Waist Circumference 40 inches   Hip Circumference 44.5 inches   Waist to Hip Ratio 0.9 %   BMI (Calculated) 30.4       Nutrition Therapy Plan and Nutrition Goals:     Nutrition Therapy & Goals - 11/13/16 1200      Nutrition Therapy   RD appointment defered Yes  HAs deferred appointmment. May change her mind  Intervention Plan   Intervention Prescribe, educate and counsel regarding individualized specific dietary modifications aiming towards targeted core components such as weight, hypertension, lipid management, diabetes, heart failure and other comorbidities.   Expected Outcomes Short Term Goal: Understand basic principles of dietary content, such as calories, fat, sodium, cholesterol and nutrients.  Attend nutrition lectures      Nutrition Discharge: Rate Your Plate Scores:   Nutrition Goals Re-Evaluation:   Nutrition Goals Discharge (Final Nutrition Goals Re-Evaluation):   Psychosocial: Target Goals: Acknowledge presence or absence of significant depression and/or stress, maximize coping skills, provide positive support system. Participant is able to verbalize types and ability to use techniques and skills needed for reducing stress and depression.   Initial Review & Psychosocial Screening:     Initial Psych Review & Screening - 11/13/16 1202      Initial Review   Current issues with None Identified;Current Sleep Concerns  Sleeping too much      Family Dynamics   Good Support System? Yes     Barriers   Psychosocial barriers to participate in program There are no identifiable barriers or psychosocial needs.     Screening Interventions   Interventions Encouraged to exercise;Provide feedback about the scores to participant      Quality of Life Scores:     Quality of Life - 11/13/16 1216      Quality of Life Scores   Health/Function Pre 20.33 %   Socioeconomic Pre 20 %   Psych/Spiritual Pre 20.57 %   Family Pre 21 %   GLOBAL Pre 20.41 %      PHQ-9: Recent Review Flowsheet Data    Depression screen [facility] 2/9 11/13/2016 03/28/2016 01/27/2016 01/26/2015 07/19/2014   Decreased Interest 0 0 0 0 0   Down, Depressed, Hopeless 0 0 0 0 0   PHQ - 2 Score 0 0 0 0 0   Altered sleeping 1 - - - -   Tired, decreased energy 1 - - - -   Change in appetite 0 - - - -   Feeling bad or failure about yourself  0 - - - -   Trouble concentrating 0 - - - -   Moving slowly or fidgety/restless 0 - - - -   Suicidal thoughts 0 - - - -   PHQ-9 Score 2 - - - -   Difficult doing work/chores Not difficult at all - - - -     Interpretation of Total Score  Total Score Depression Severity:  1-4 = Minimal depression, 5-9 = Mild depression, 10-14 = Moderate depression, 15-19 = Moderately severe depression, 20-27 = Severe depression   Psychosocial Evaluation and Intervention:   Psychosocial Re-Evaluation:   Psychosocial Discharge (Final Psychosocial Re-Evaluation):   Education: Education Goals: Education classes will be provided on a weekly basis, covering required topics. Participant will state understanding/return demonstration of topics presented.  Learning Barriers/Preferences:     Learning Barriers/Preferences - 11/13/16 1220      Learning Barriers/Preferences   Learning Barriers None   Learning Preferences None      Education Topics: Initial Evaluation Education: - Verbal, written and demonstration of respiratory meds, RPE/PD  scales, oximetry and breathing techniques. Instruction on use of nebulizers and MDIs: cleaning and proper use, rinsing mouth with steroid doses and importance of monitoring MDI activations.   Pulmonary Rehab from 11/13/2016 in [facility] Cardiac and Pulmonary Rehab  Date  11/13/16  Educator  SB  Instruction Review Code  2- meets goals/outcomes  General Nutrition Guidelines/Fats and Fiber: -Group instruction provided by verbal, written material, models and posters to present the general guidelines for heart healthy nutrition. Gives an explanation and review of dietary fats and fiber.   Controlling Sodium/Reading Food Labels: -Group verbal and written material supporting the discussion of sodium use in heart healthy nutrition. Review and explanation with models, verbal and written materials for utilization of the food label.   Exercise Physiology & Risk Factors: - Group verbal and written instruction with models to review the exercise physiology of the cardiovascular system and associated critical values. Details cardiovascular disease risk factors and the goals associated with each risk factor.   Aerobic Exercise & Resistance Training: - Gives group verbal and written discussion on the health impact of inactivity. On the components of aerobic and resistive training programs and the benefits of this training and how to safely progress through these programs.   Flexibility, Balance, General Exercise Guidelines: - Provides group verbal and written instruction on the benefits of flexibility and balance training programs. Provides general exercise guidelines with specific guidelines to those with heart or lung disease. Demonstration and skill practice provided.   Stress Management: - Provides group verbal and written instruction about the health risks of elevated stress, cause of high stress, and healthy ways to reduce stress.   Depression: - Provides group verbal and written instruction on  the correlation between heart/lung disease and depressed mood, treatment options, and the stigmas associated with seeking treatment.   Exercise & Equipment Safety: - Individual verbal instruction and demonstration of equipment use and safety with use of the equipment.   Pulmonary Rehab from 11/13/2016 in [facility] Cardiac and Pulmonary Rehab  Date  11/13/16  Educator  Sb  Instruction Review Code  2- meets goals/outcomes      Infection Prevention: - Provides verbal and written material to individual with discussion of infection control including proper hand washing and proper equipment cleaning during exercise session.   Pulmonary Rehab from 11/13/2016 in [facility] Cardiac and Pulmonary Rehab  Date  11/13/16  Educator  SB  Instruction Review Code  2- meets goals/outcomes      Falls Prevention: - Provides verbal and written material to individual with discussion of falls prevention and safety.   Pulmonary Rehab from 11/13/2016 in [facility] Cardiac and Pulmonary Rehab  Date  11/13/16  Educator  SB  Instruction Review Code  2- meets goals/outcomes      Diabetes: - Individual verbal and written instruction to review signs/symptoms of diabetes, desired ranges of glucose level fasting, after meals and with exercise. Advice that pre and post exercise glucose checks will be done for 3 sessions at entry of program.   Chronic Lung Diseases: - Group verbal and written instruction to review new updates, new respiratory medications, new advancements in procedures and treatments. Provide informative websites and "800" numbers of self-education.   Lung Procedures: - Group verbal and written instruction to describe testing methods done to diagnose lung disease. Review the outcome of test results. Describe the treatment choices: Pulmonary Function Tests, ABGs and oximetry.   Energy Conservation: - Provide group verbal and written instruction for methods to conserve energy, plan and organize activities.  Instruct on pacing techniques, use of adaptive equipment and posture/positioning to relieve shortness of breath.   Triggers: - Group verbal and written instruction to review types of environmental controls: home humidity, furnaces, filters, dust mite/pet prevention, HEPA vacuums. To discuss weather changes, air quality and the benefits of nasal washing.   Exacerbations: -  Group verbal and written instruction to provide: warning signs, infection symptoms, calling MD promptly, preventive modes, and value of vaccinations. Review: effective airway clearance, coughing and/or vibration techniques. Create an Sports administrator.   Oxygen: - Individual and group verbal and written instruction on oxygen therapy. Includes supplement oxygen, available portable oxygen systems, continuous and intermittent flow rates, oxygen safety, concentrators, and Medicare reimbursement for oxygen.   Respiratory Medications: - Group verbal and written instruction to review medications for lung disease. Drug class, frequency, complications, importance of spacers, rinsing mouth after steroid MDI's, and proper cleaning methods for nebulizers.   AED/CPR: - Group verbal and written instruction with the use of models to demonstrate the basic use of the AED with the basic ABC's of resuscitation.   Breathing Retraining: - Provides individuals verbal and written instruction on purpose, frequency, and proper technique of diaphragmatic breathing and pursed-lipped breathing. Applies individual practice skills.   Pulmonary Rehab from 11/13/2016 in [facility] Cardiac and Pulmonary Rehab  Date  11/13/16  Educator  SB  Instruction Review Code  2- meets goals/outcomes      Anatomy and Physiology of the Lungs: - Group verbal and written instruction with the use of models to provide basic lung anatomy and physiology related to function, structure and complications of lung disease.   Heart Failure: - Group verbal and written instruction on  the basics of heart failure: signs/symptoms, treatments, explanation of ejection fraction, enlarged heart and cardiomyopathy.   Sleep Apnea: - Individual verbal and written instruction to review Obstructive Sleep Apnea. Review of risk factors, methods for diagnosing and types of masks and machines for OSA.   Anxiety: - Provides group, verbal and written instruction on the correlation between heart/lung disease and anxiety, treatment options, and management of anxiety.   Relaxation: - Provides group, verbal and written instruction about the benefits of relaxation for patients with heart/lung disease. Also provides patients with examples of relaxation techniques.   Knowledge Questionnaire Score:     Knowledge Questionnaire Score - 11/13/16 1221      Knowledge Questionnaire Score   Pre Score 8/10  Reviewed correct responses with Caren Griffins, she verbalized understanding       Core Components/Risk Factors/Patient Goals at Admission:     Personal Goals and Risk Factors at Admission - 11/13/16 1200      Core Components/Risk Factors/Patient Goals on Admission    Weight Management Yes;Obesity;Weight Maintenance   Intervention Weight Management: Develop a combined nutrition and exercise program designed to reach desired caloric intake, while maintaining appropriate intake of nutrient and fiber, sodium and fats, and appropriate energy expenditure required for the weight goal.;Weight Management/Obesity: Establish reasonable short term and long term weight goals.   Admit Weight 186 lb (84.4 kg)   Goal Weight: Short Term 182 lb (82.6 kg)   Goal Weight: Long Term 150 lb (68 kg)   Expected Outcomes Short Term: Continue to assess and modify interventions until short term weight is achieved;Long Term: Adherence to nutrition and physical activity/exercise program aimed toward attainment of established weight goal;Weight Loss: Understanding of general recommendations for a balanced deficit meal plan,  which promotes 1-2 lb weight loss per week and includes a negative energy balance of [phone] kcal/d   Improve shortness of breath with ADL's Yes  Occurs with exertion   Intervention Provide education, individualized exercise plan and daily activity instruction to help decrease symptoms of SOB with activities of daily living.   Expected Outcomes Short Term: Achieves a reduction of symptoms when performing activities of  daily living.   Develop more efficient breathing techniques such as purse lipped breathing and diaphragmatic breathing; and practicing self-pacing with activity Yes   Intervention Provide education, demonstration and support about specific breathing techniuqes utilized for more efficient breathing. Include techniques such as pursed lipped breathing, diaphragmatic breathing and self-pacing activity.   Expected Outcomes Short Term: Participant will be able to demonstrate and use breathing techniques as needed throughout daily activities.   Heart Failure Yes   Intervention Provide a combined exercise and nutrition program that is supplemented with education, support and counseling about heart failure. Directed toward relieving symptoms such as shortness of breath, decreased exercise tolerance, and extremity edema.   Expected Outcomes Improve functional capacity of life;Short term: Attendance in program 2-3 days a week with increased exercise capacity. Reported lower sodium intake. Reported increased fruit and vegetable intake. Reports medication compliance.;Short term: Daily weights obtained and reported for increase. Utilizing diuretic protocols set by physician.;Long term: Adoption of self-care skills and reduction of barriers for early signs and symptoms recognition and intervention leading to self-care maintenance.   Hypertension Yes   Intervention Provide education on lifestyle modifcations including regular physical activity/exercise, weight management, moderate sodium restriction and  increased consumption of fresh fruit, vegetables, and low fat dairy, alcohol moderation, and smoking cessation.;Monitor prescription use compliance.   Expected Outcomes Short Term: Continued assessment and intervention until BP is < 140/9mm HG in hypertensive participants. < 130/42mm HG in hypertensive participants with diabetes, heart failure or chronic kidney disease.;Long Term: Maintenance of blood pressure at goal levels.   Lipids Yes   Intervention Provide education and support for participant on nutrition & aerobic/resistive exercise along with prescribed medications to achieve LDL 70mg , HDL >40mg .   Expected Outcomes Short Term: Participant states understanding of desired cholesterol values and is compliant with medications prescribed. Participant is following exercise prescription and nutrition guidelines.;Long Term: Cholesterol controlled with medications as prescribed, with individualized exercise RX and with personalized nutrition plan. Value goals: LDL < 70mg , HDL > 40 mg.      Core Components/Risk Factors/Patient Goals Review:    Core Components/Risk Factors/Patient Goals at Discharge (Final Review):    ITP Comments:     ITP Comments    Row Name 11/13/16 1146           ITP Comments Medical review completed. Initial ITP created. Documentation of Diagnosis can be found in [facility] Encounter 10/12/2016          Comments: Ms Gonser plans to start [facility] on 11/23/16 and attend 3 days/week.

## 2016-11-13 NOTE — Patient Instructions (Signed)
Patient Instructions  Patient Details  Name: Betty [name] MRN: [id number] Date of Birth: 1946/01/27 Referring Provider:  Jolaine Artist, MD  Below are the personal goals you chose as well as exercise and nutrition goals. Our goal is to help you keep on track towards obtaining and maintaining your goals. We will be discussing your progress on these goals with you throughout the program.  Initial Exercise Prescription:     Initial Exercise Prescription - 11/13/16 1200      Date of Initial Exercise RX and Referring Provider   Date 11/13/16   Referring Provider Bensimhon     Treadmill   MPH 2.4   Grade 0.5   Minutes 15   METs 3     REL-XR   Level 2   Speed 50   Minutes 15   METs 3     Biostep-RELP   Level 3   SPM 50   Minutes 15   METs 3     Prescription Details   Frequency (times per week) 3   Duration Progress to 45 minutes of aerobic exercise without signs/symptoms of physical distress     Intensity   THRR 40-80% of Max Heartrate 109-137   Ratings of Perceived Exertion 11-15   Perceived Dyspnea 0-4     Progression   Progression Continue to progress workloads to maintain intensity without signs/symptoms of physical distress.     Resistance Training   Training Prescription Yes   Weight 2   Reps 10-15      Exercise Goals: Frequency: Be able to perform aerobic exercise three times per week working toward 3-5 days per week.  Intensity: Work with a perceived exertion of 11 (fairly light) - 15 (hard) as tolerated. Follow your new exercise prescription and watch for changes in prescription as you progress with the program. Changes will be reviewed with you when they are made.  Duration: You should be able to do 30 minutes of continuous aerobic exercise in addition to a 5 minute warm-up and a 5 minute cool-down routine.  Nutrition Goals: Your personal nutrition goals will be established when you do your nutrition analysis with the dietician.  The  following are nutrition guidelines to follow: Cholesterol < 200mg /day Sodium < 1500mg /day Fiber: Women over 50 yrs - 21 grams per day  Personal Goals:     Personal Goals and Risk Factors at Admission - 11/13/16 1200      Core Components/Risk Factors/Patient Goals on Admission    Weight Management Yes;Obesity;Weight Maintenance   Intervention Weight Management: Develop a combined nutrition and exercise program designed to reach desired caloric intake, while maintaining appropriate intake of nutrient and fiber, sodium and fats, and appropriate energy expenditure required for the weight goal.;Weight Management/Obesity: Establish reasonable short term and long term weight goals.   Admit Weight 186 lb (84.4 kg)   Goal Weight: Short Term 182 lb (82.6 kg)   Goal Weight: Long Term 150 lb (68 kg)   Expected Outcomes Short Term: Continue to assess and modify interventions until short term weight is achieved;Long Term: Adherence to nutrition and physical activity/exercise program aimed toward attainment of established weight goal;Weight Loss: Understanding of general recommendations for a balanced deficit meal plan, which promotes 1-2 lb weight loss per week and includes a negative energy balance of [phone] kcal/d   Improve shortness of breath with ADL's Yes  Occurs with exertion   Intervention Provide education, individualized exercise plan and daily activity instruction to help decrease symptoms of SOB with  activities of daily living.   Expected Outcomes Short Term: Achieves a reduction of symptoms when performing activities of daily living.   Develop more efficient breathing techniques such as purse lipped breathing and diaphragmatic breathing; and practicing self-pacing with activity Yes   Intervention Provide education, demonstration and support about specific breathing techniuqes utilized for more efficient breathing. Include techniques such as pursed lipped breathing, diaphragmatic breathing and  self-pacing activity.   Expected Outcomes Short Term: Participant will be able to demonstrate and use breathing techniques as needed throughout daily activities.   Heart Failure Yes   Intervention Provide a combined exercise and nutrition program that is supplemented with education, support and counseling about heart failure. Directed toward relieving symptoms such as shortness of breath, decreased exercise tolerance, and extremity edema.   Expected Outcomes Improve functional capacity of life;Short term: Attendance in program 2-3 days a week with increased exercise capacity. Reported lower sodium intake. Reported increased fruit and vegetable intake. Reports medication compliance.;Short term: Daily weights obtained and reported for increase. Utilizing diuretic protocols set by physician.;Long term: Adoption of self-care skills and reduction of barriers for early signs and symptoms recognition and intervention leading to self-care maintenance.   Hypertension Yes   Intervention Provide education on lifestyle modifcations including regular physical activity/exercise, weight management, moderate sodium restriction and increased consumption of fresh fruit, vegetables, and low fat dairy, alcohol moderation, and smoking cessation.;Monitor prescription use compliance.   Expected Outcomes Short Term: Continued assessment and intervention until BP is < 140/65mm HG in hypertensive participants. < 130/75mm HG in hypertensive participants with diabetes, heart failure or chronic kidney disease.;Long Term: Maintenance of blood pressure at goal levels.   Lipids Yes   Intervention Provide education and support for participant on nutrition & aerobic/resistive exercise along with prescribed medications to achieve LDL 70mg , HDL >40mg .   Expected Outcomes Short Term: Participant states understanding of desired cholesterol values and is compliant with medications prescribed. Participant is following exercise prescription and  nutrition guidelines.;Long Term: Cholesterol controlled with medications as prescribed, with individualized exercise RX and with personalized nutrition plan. Value goals: LDL < 70mg , HDL > 40 mg.      Tobacco Use Initial Evaluation: History  Smoking Status   Never Smoker  Smokeless Tobacco   Never Used    Copy of goals given to participant.

## 2016-11-21 ENCOUNTER — Encounter: Payer: Self-pay | Admitting: Pulmonary Disease

## 2016-11-21 ENCOUNTER — Ambulatory Visit (INDEPENDENT_AMBULATORY_CARE_PROVIDER_SITE_OTHER): Payer: Medicare Other | Admitting: Pulmonary Disease

## 2016-11-21 DIAGNOSIS — G4733 Obstructive sleep apnea (adult) (pediatric): Secondary | ICD-10-CM | POA: Diagnosis not present

## 2016-11-21 DIAGNOSIS — R0609 Other forms of dyspnea: Secondary | ICD-10-CM | POA: Diagnosis not present

## 2016-11-21 DIAGNOSIS — J984 Other disorders of lung: Secondary | ICD-10-CM

## 2016-11-21 DIAGNOSIS — J9611 Chronic respiratory failure with hypoxia: Secondary | ICD-10-CM

## 2016-11-21 DIAGNOSIS — R0600 Dyspnea, unspecified: Secondary | ICD-10-CM | POA: Diagnosis not present

## 2016-11-21 LAB — PULMONARY FUNCTION TEST
DL/VA % PRED: 104 %
DL/VA: 5.16 ml/min/mmHg/L
DLCO cor % pred: 62 %
DLCO cor: 15.89 ml/min/mmHg
DLCO unc % pred: 62 %
DLCO unc: 16.09 ml/min/mmHg
FEF 25-75 Post: 1.97 L/sec
FEF 25-75 Pre: 1.77 L/sec
FEF2575-%CHANGE-POST: 11 %
FEF2575-%PRED-POST: 99 %
FEF2575-%Pred-Pre: 89 %
FEV1-%CHANGE-POST: 3 %
FEV1-%Pred-Post: 63 %
FEV1-%Pred-Pre: 61 %
FEV1-PRE: 1.45 L
FEV1-Post: 1.49 L
FEV1FVC-%Change-Post: 1 %
FEV1FVC-%PRED-PRE: 109 %
FEV6-%Change-Post: 1 %
FEV6-%Pred-Post: 58 %
FEV6-%Pred-Pre: 58 %
FEV6-PRE: 1.74 L
FEV6-Post: 1.76 L
FEV6FVC-%PRED-PRE: 104 %
FEV6FVC-%Pred-Post: 104 %
FVC-%CHANGE-POST: 1 %
FVC-%PRED-POST: 56 %
FVC-%PRED-PRE: 55 %
FVC-POST: 1.76 L
FVC-PRE: 1.74 L
POST FEV1/FVC RATIO: 85 %
PRE FEV6/FVC RATIO: 100 %
Post FEV6/FVC ratio: 100 %
Pre FEV1/FVC ratio: 84 %
RV % PRED: 83 %
RV: 1.87 L
TLC % pred: 61 %
TLC: 3.19 L

## 2016-11-21 NOTE — Assessment & Plan Note (Signed)
This is improving. I see no evidence of lung disease on her pulmonary function tests today.  Plan: Continue exercise at cardiac rehabilitation Monitor O2 saturation with exercise, if it drops below 88% then use oxygen

## 2016-11-21 NOTE — Assessment & Plan Note (Signed)
Continue CPAP daily at bedtime with supplemental oxygen.

## 2016-11-21 NOTE — Assessment & Plan Note (Signed)
Continue supplemental oxygen daily at bedtime with CPAP. As above, monitor O2 saturation with exercise and use supplemental oxygen if drops below 88%.

## 2016-11-21 NOTE — Assessment & Plan Note (Signed)
No evidence of pulmonary parenchymal disease to explain this. This is due to body habitus. Weight loss was strongly encouraged.

## 2016-11-21 NOTE — Patient Instructions (Signed)
Exercise at cardiac rehabilitation on a regular basis If your oxygen level drops below 88% with exercise then you need to use supplemental oxygen Keep using oxygen when you sleep at night I will see you back in one year or sooner if needed

## 2016-11-21 NOTE — Progress Notes (Signed)
PFT done today. 

## 2016-11-21 NOTE — Progress Notes (Signed)
Subjective:    Patient ID: Betty [name], female    DOB: 1946-02-09, 71 y.o.   MRN: [id number]  Synopsis: Betty [name] is a very pleasant female who first saw the [facility] pulmonary clinic in December 2013 for evaluation of shortness of breath. She is a lifetime nonsmoker. She developed increasing shortness of breath over the course of 2 years about 2011 to 2013. Pulmonary function testing showed restrictive lung disease with a markedly depressed DLCO at 35% predicted. CT scanning of her chest showed upper lobe groundglass abnormalities versus air trapping.  A home polysomnogram showed an AHI of 12 and multiple desaturation events to 80% independent of apneas.  An abg showed resting hypercarbia (pCO2 52), and a MIP/MEP was 36%/28% predicted.  A follow up diaphragm study was normal.  A repeat CT scan was performed, no pulmonary embolism was seen and radiology commented on atelectasis but no clear intersitial lung disease.  Blood work including an HP panel and serologies for connective tissue diseases has been essentially negative (two partial aspergillus bands on HP panel).  Two echocardiograms have not shown evidence of pulmonary hypertension.  After starting on CPAP with nasal pillows and oxygen with exertion and sleep she started feeling better.  A right heart catheterization performed 01/21/2013 did not show pulmonary hypertension.  She had an open lung biopsy at [facility] in November of 2014 which showed small amounts of fibrosis around her pulmonary arteries as well as findings consistent with very mild bronchiolitis. An exercise right heart catheterization test was performed which demonstrated dramatic increases in her left heart pressure with exercise  HPI Chief Complaint  Patient presents with  . Follow-up    review today's PFT.  pt states she is doing well, denies any breathing complaints at this time.    She is feeling better. She has lost a little weight  since the last visit. She is following a diet plan right now which is working.  No cough, rare mucus production associated with allergies. She says her breathing has been OK. She has not exercised much, pulmonary rehab is too expensive.   She is going to use her silver sneakers plan at cardiac rehab.   She is just using oxygen at night, not with exercise.   Past Medical History:  Diagnosis Date  . Allergic rhinitis    never tested, fall and spring  . Arthritis    hands,knees, feet  . Cataracts, bilateral    not surgical yet  . Chronic diastolic CHF (congestive heart failure) ([place])   . Chronic respiratory failure ([place])   . Diabetes mellitus without complication ([place])   . Diverticulosis    problems with frequent gas, burping  . Glucose intolerance (impaired glucose tolerance)   . Gout   . History of chicken pox   . Hyperlipidemia   . Hypertension   . NICM (nonischemic cardiomyopathy) ([place]) 2002   EF 25%; improved to normal - echo 4/08: EF 50-55%, mild MR, mild LAE, mild TR;    cath 3/03: normal cors, EF 40%  . Obesity   . PONV (postoperative nausea and vomiting)   . Restrictive lung disease   . Skin cancer    skin cancers only  . Sleep apnea    cpap settings 4     Family History  Problem Relation Age of Onset  . Lymphoma Mother   . Cancer Mother 71    Lymphoma  . COPD Father     was a smoker  .  Stroke Father   . Pneumonia Father   . Diabetes Father   . Hyperlipidemia Father   . Heart disease Father   . Heart attack Father   . Hypertension Father   . Diabetes Sister   . Depression Sister   . Meniere's disease Brother   . Diabetes Paternal Grandfather   . Heart disease Paternal Grandfather   . Schizophrenia Daughter   . Cancer Maternal Grandmother     Bladder  . Cancer Maternal Grandfather     leukemia  . Hypertension Brother   . Colon cancer Neg Hx   . Stomach cancer Neg Hx      Social History   Social History  . Marital status: Married    Spouse  name: N/A  . Number of children: 2  . Years of education: N/A   Occupational History  . Retired Pharmacist, hospital Retired  . Works at [employer]  . Smoking status: Never Smoker  . Smokeless tobacco: Never Used  . Alcohol use No  . Drug use: No  . Sexual activity: No   Other Topics Concern  . Not on file   Social History Narrative   Lives in [place] with husband. 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 No pets.     Allergies  Allergen Reactions  . Amoxicillin     REACTION: RASH  . Etodolac     Bloody stool  . Hctz [Hydrochlorothiazide]     Increases gout flare  . Meloxicam     constipation  . Metformin And Related     diarrhea  . Sulfonamide Derivatives     REACTION: RASH     Outpatient Medications Prior to Visit  Medication Sig Dispense Refill  . aspirin 81 MG EC tablet Take 81 mg by mouth daily.     . carvedilol (COREG) 12.5 MG tablet TAKE 1 TABLET BY MOUTH TWICE A DAY WITH A MEAL 180 tablet 1  . Coenzyme Q10 (CO Q 10) 100 MG CAPS Take 100 mg by mouth at bedtime.     . Fish Oil OIL Take 227 mcg by mouth 2 (two) times daily. GUMMIES    . furosemide (LASIX) 40 MG tablet Take 1 tablet (40 mg total) by mouth daily. Take extra tablet once daily as needed for weight gain or swelling. 180 tablet 3  . KLOR-CON M20 20 MEQ tablet TAKE 2 TABLETS EVERY MORNING AND 1 TABLET EACH EVENING 90 tablet 2  . losartan (COZAAR) 50 MG tablet TAKE 0.5 TABLETS (25 MG TOTAL) BY MOUTH DAILY. 90 tablet 0  . multivitamin (THERAGRAN) per tablet Take 1 tablet by mouth daily.     Marland Kitchen OVER THE COUNTER MEDICATION Take 2 capsules by mouth 2 (two) times daily.    . polyvinyl alcohol (LIQUIFILM TEARS) 1.4 % ophthalmic solution Place 1 drop into both eyes every morning.    . simvastatin (ZOCOR) 20 MG tablet TAKE 1 TABLET BY MOUTH AT BEDTIME. 90 tablet 3  . Calcium Citrate-Vitamin D 200-125 MG-UNIT TABS Take 250 Units by mouth 2 (two) times daily.     .  Cholecalciferol (VITAMIN D-3) 1000 UNITS CAPS Take 1,000 Units by mouth 2 (two) times daily. GUMMIES     No facility-administered medications prior to visit.       Review of Systems  Constitutional: Negative for fever and unexpected weight change.  HENT: Negative for congestion, dental problem, ear pain, nosebleeds, postnasal drip, rhinorrhea, sinus pressure, sneezing, sore throat  and trouble swallowing.   Eyes: Negative for redness and itching.  Respiratory: Positive for shortness of breath. Negative for cough, chest tightness and wheezing.   Cardiovascular: Negative for palpitations and leg swelling.  Gastrointestinal: Negative for nausea and vomiting.  Genitourinary: Negative for dysuria.  Musculoskeletal: Negative for joint swelling.  Skin: Negative for rash.  Neurological: Negative for headaches.  Hematological: Does not bruise/bleed easily.  Psychiatric/Behavioral: Negative for dysphoric mood. The patient is not nervous/anxious.        Objective:   Physical Exam Vitals:   11/21/16 0958  BP: 112/62  Pulse: 72  SpO2: 96%  Weight: 185 lb (83.9 kg)  Height: 5' 5" (1.651 m)   RA  Gen: well appearing HENT: OP clear, TM's clear, neck supple PULM: Few crackles bases B, normal percussion CV: RRR, no mgr, trace edema GI: BS+, soft, nontender Derm: no cyanosis or rash Psyche: normal mood and affect    06/2012 Echo>> grade 1 diastolic dysfunction, LVEF 55%, RV/RA normal, triscupid regurge jet velocity WNL 08/2012 PFT [facility] >> Ratio normal, Flow volume loop normal; FVC 1.46 L (48% pred), TLC 2.77 L (55% pred) ERV 0.03 (3% pred), DLCO 8.4 (35% pred) 08/2012 ABG >> 7.39/52/57/31.5/92% 10/01/2012 PSG>> poor sleep efficiency; AHI < 5; desaturated as low as 82% 09/2012 PFT [facility] >> unchanged from 08/2012 study. 10/20/2012 MIP/MEP >> 36 (36% pred)/-20 (28% pred) 09/2012 serology>> ANA neg, Anti-SCL70 neg, SSA/SSB neg/neg, Anti-Jo-1 neg, Aldolase 5, CRP 5, ESR 30, RF 31 10/24/2012 Sniff  test >> normal diaphragm movement 09/2012 Home PSG >> AHI 12, several desaturation events independent of apnea 09/2012 Hypersensitivity Pneumonitis profile> Asp flav pos/asp nig partial; all else negative 12/09/2012 6MW >> 950 feet on 2L [place] 12/16/2012 V/Q scan >> multiple areas of matched decreased ventilation and perfusion uptake, more in LUL 01/21/2013 RHC >> RA 9 mmHg, RV 32/8 mmHg, PA 30/16/23 mmHg, PCWP 12 mmHg, Normal wave forms. Oxygen saturations: (done with 2 L [place]), PA 73 %, AO 98%, RV 74%, RA 75%, SVC 75%, IVC 83%; Cardiac Output (Fick) 5.3, Cardiac Index (Fick) 2.7, PVR: 2.1   07/2013 Open lung biopsy [facility]> mild peribronchiolar fibrosis without inflammatory cells, also mild scattered areas of pulmonary arteriopathic change; normal airspace/alveolar tissue  10/2013 Exercise [facility] at [facility] rest> PA 36/20 (mean 26), PCW 15, RA 9, BP 158/77, PA sat 65.5%, CI 2.88, CO 5.7, PVR 1.75 WU; Exercise> PA 78/40 (56), PCW 28, BP 228/83, CI 6.9, CO 13.75, PVR 2 WU; O2 saturation noted to drop to 77% on RA  10/2016 pulmonary function test ratio 85%, FEV1 1.49 L 63% predicted, FVC 1.76 L 56% predicted, total lung capacity 3.19 L 61% predicted, DLCO 16.09 mL per minute per millimeters of mercury, 62% predicted     Assessment & Plan:  Dyspnea This is improving. I see no evidence of lung disease on her pulmonary function tests today.  Plan: Continue exercise at cardiac rehabilitation Monitor O2 saturation with exercise, if it drops below 88% then use oxygen  Restrictive lung disease No evidence of pulmonary parenchymal disease to explain this. This is due to body habitus. Weight loss was strongly encouraged.  Chronic hypoxemic respiratory failure (HCC) Continue supplemental oxygen daily at bedtime with CPAP. As above, monitor O2 saturation with exercise and use supplemental oxygen if drops below 88%.  Obstructive sleep apnea Continue CPAP daily at bedtime with supplemental oxygen.    Current  Outpatient Prescriptions:  .  aspirin 81 MG EC tablet, Take 81 mg by mouth daily. ,  Disp: , Rfl:  .  carvedilol (COREG) 12.5 MG tablet, TAKE 1 TABLET BY MOUTH TWICE A DAY WITH A MEAL, Disp: 180 tablet, Rfl: 1 .  Coenzyme Q10 (CO Q 10) 100 MG CAPS, Take 100 mg by mouth at bedtime. , Disp: , Rfl:  .  Fish Oil OIL, Take 227 mcg by mouth 2 (two) times daily. GUMMIES, Disp: , Rfl:  .  furosemide (LASIX) 40 MG tablet, Take 1 tablet (40 mg total) by mouth daily. Take extra tablet once daily as needed for weight gain or swelling., Disp: 180 tablet, Rfl: 3 .  KLOR-CON M20 20 MEQ tablet, TAKE 2 TABLETS EVERY MORNING AND 1 TABLET EACH EVENING, Disp: 90 tablet, Rfl: 2 .  losartan (COZAAR) 50 MG tablet, TAKE 0.5 TABLETS (25 MG TOTAL) BY MOUTH DAILY., Disp: 90 tablet, Rfl: 0 .  multivitamin (THERAGRAN) per tablet, Take 1 tablet by mouth daily. , Disp: , Rfl:  .  OVER THE COUNTER MEDICATION, Take 2 capsules by mouth 2 (two) times daily., Disp: , Rfl:  .  polyvinyl alcohol (LIQUIFILM TEARS) 1.4 % ophthalmic solution, Place 1 drop into both eyes every morning., Disp: , Rfl:  .  simvastatin (ZOCOR) 20 MG tablet, TAKE 1 TABLET BY MOUTH AT BEDTIME., Disp: 90 tablet, Rfl: 3

## 2016-11-23 ENCOUNTER — Ambulatory Visit: Payer: Medicare Other

## 2016-11-26 ENCOUNTER — Ambulatory Visit: Payer: Medicare Other

## 2016-11-28 ENCOUNTER — Ambulatory Visit: Payer: Medicare Other

## 2016-11-30 ENCOUNTER — Ambulatory Visit: Payer: Medicare Other

## 2016-12-03 ENCOUNTER — Ambulatory Visit: Payer: Medicare Other

## 2016-12-05 ENCOUNTER — Ambulatory Visit: Payer: Medicare Other

## 2016-12-07 ENCOUNTER — Ambulatory Visit: Payer: Medicare Other

## 2016-12-10 ENCOUNTER — Other Ambulatory Visit (HOSPITAL_COMMUNITY): Payer: Self-pay | Admitting: Internal Medicine

## 2016-12-10 ENCOUNTER — Ambulatory Visit: Payer: Medicare Other

## 2016-12-12 ENCOUNTER — Ambulatory Visit: Payer: Medicare Other

## 2016-12-14 ENCOUNTER — Ambulatory Visit: Payer: Medicare Other

## 2016-12-17 ENCOUNTER — Ambulatory Visit: Payer: Medicare Other

## 2016-12-19 ENCOUNTER — Ambulatory Visit: Payer: Medicare Other

## 2016-12-21 ENCOUNTER — Other Ambulatory Visit: Payer: Self-pay | Admitting: Family Medicine

## 2016-12-21 ENCOUNTER — Ambulatory Visit: Payer: Medicare Other

## 2016-12-21 DIAGNOSIS — Z1231 Encounter for screening mammogram for malignant neoplasm of breast: Secondary | ICD-10-CM

## 2016-12-24 ENCOUNTER — Ambulatory Visit: Payer: Medicare Other

## 2016-12-26 ENCOUNTER — Ambulatory Visit: Payer: Medicare Other

## 2016-12-28 ENCOUNTER — Ambulatory Visit: Payer: Medicare Other

## 2016-12-31 ENCOUNTER — Ambulatory Visit: Payer: Medicare Other

## 2017-01-02 ENCOUNTER — Ambulatory Visit: Payer: Medicare Other

## 2017-01-04 ENCOUNTER — Ambulatory Visit: Payer: Medicare Other

## 2017-01-07 ENCOUNTER — Other Ambulatory Visit (HOSPITAL_COMMUNITY): Payer: Self-pay | Admitting: Internal Medicine

## 2017-01-07 ENCOUNTER — Ambulatory Visit: Payer: Medicare Other

## 2017-01-09 ENCOUNTER — Ambulatory Visit: Payer: Medicare Other

## 2017-01-11 ENCOUNTER — Ambulatory Visit: Payer: Medicare Other

## 2017-01-14 ENCOUNTER — Ambulatory Visit: Payer: Medicare Other

## 2017-01-16 ENCOUNTER — Ambulatory Visit: Payer: Medicare Other

## 2017-01-18 ENCOUNTER — Ambulatory Visit: Payer: Medicare Other

## 2017-01-22 ENCOUNTER — Other Ambulatory Visit: Payer: Self-pay | Admitting: Family Medicine

## 2017-01-23 ENCOUNTER — Ambulatory Visit: Payer: Medicare Other

## 2017-01-25 ENCOUNTER — Ambulatory Visit: Payer: Medicare Other

## 2017-01-28 ENCOUNTER — Ambulatory Visit: Payer: Medicare Other

## 2017-01-30 ENCOUNTER — Ambulatory Visit: Payer: Medicare Other

## 2017-02-01 ENCOUNTER — Ambulatory Visit: Payer: Medicare Other

## 2017-02-04 ENCOUNTER — Ambulatory Visit
Admission: RE | Admit: 2017-02-04 | Discharge: 2017-02-04 | Disposition: A | Discharge status: Home / Self Care | Payer: Medicare Other | Source: Ambulatory Visit | Attending: Family Medicine | Admitting: Family Medicine

## 2017-02-04 ENCOUNTER — Ambulatory Visit: Payer: Medicare Other

## 2017-02-04 DIAGNOSIS — Z1231 Encounter for screening mammogram for malignant neoplasm of breast: Secondary | ICD-10-CM | POA: Insufficient documentation

## 2017-02-06 ENCOUNTER — Ambulatory Visit: Payer: Medicare Other

## 2017-02-08 ENCOUNTER — Ambulatory Visit: Payer: Medicare Other

## 2017-02-11 ENCOUNTER — Ambulatory Visit: Payer: Medicare Other

## 2017-02-13 ENCOUNTER — Ambulatory Visit: Payer: Medicare Other

## 2017-03-12 ENCOUNTER — Other Ambulatory Visit (HOSPITAL_COMMUNITY): Payer: Self-pay | Admitting: *Deleted

## 2017-03-12 MED ORDER — POTASSIUM CHLORIDE CRYS ER 20 MEQ PO TBCR: EXTENDED_RELEASE_TABLET | ORAL | 2 refills | Status: DC

## 2017-03-28 ENCOUNTER — Ambulatory Visit (INDEPENDENT_AMBULATORY_CARE_PROVIDER_SITE_OTHER): Payer: Medicare Other

## 2017-03-28 VITALS — BP 108/62 | HR 76 | Temp 98.0°F | Resp 16 | Ht 65.0 in | Wt 184.4 lb

## 2017-03-28 DIAGNOSIS — Z0000 Encounter for general adult medical examination without abnormal findings: Secondary | ICD-10-CM | POA: Diagnosis not present

## 2017-03-28 DIAGNOSIS — Z1389 Encounter for screening for other disorder: Secondary | ICD-10-CM | POA: Diagnosis not present

## 2017-03-28 DIAGNOSIS — Z1331 Encounter for screening for depression: Secondary | ICD-10-CM

## 2017-03-28 NOTE — Patient Instructions (Addendum)
  [name] , Thank you for taking time to come for your Medicare Wellness Visit. I appreciate your ongoing commitment to your health goals. Please review the following plan we discussed and let me know if I can assist you in the future.   Follow up with Dr. Caryl Bis as needed.    Have a great day!  These are the goals we discussed: Goals    . Healthy Lifestyle          Stay hydrated Stay active Low carb foods       This is a list of the screening recommended for you and due dates:  Health Maintenance  Topic Date Due  . Complete foot exam   01/26/2017  . Hemoglobin A1C  01/28/2017  . Flu Shot  03/27/2017  . Eye exam for diabetics  05/04/2017  . Colon Cancer Screening  12/19/2017  . Mammogram  02/05/2019  . Tetanus Vaccine  10/15/2023  . DEXA scan (bone density measurement)  Completed  .  Hepatitis C: One time screening is recommended by Center for Disease Control  (CDC) for  adults born from [birth year] through 1964.   Completed  . Pneumonia vaccines  Completed

## 2017-03-28 NOTE — Progress Notes (Signed)
Subjective:   Betty [name] is a 71 y.o. female who presents for Medicare Annual (Subsequent) preventive examination.  Review of Systems:  No ROS.  Medicare Wellness Visit. Additional risk factors are reflected in the social history.  Cardiac Risk Factors include: advanced age (>26men, >37 women);diabetes mellitus     Objective:     Vitals: BP 108/62 (BP Location: Left Arm, Patient Position: Sitting, Cuff Size: Normal)   Pulse 76   Temp 98 F (36.7 C) (Oral)   Resp 16   Ht 5\' 5"  (1.651 m)   Wt 184 lb 6.4 oz (83.6 kg)   SpO2 93%   BMI 30.69 kg/m   Body mass index is 30.69 kg/m.   Tobacco History  Smoking Status  . Never Smoker  Smokeless Tobacco  . Never Used     Counseling given: Not Answered   Past Medical History:  Diagnosis Date  . Allergic rhinitis    never tested, fall and spring  . Arthritis    hands,knees, feet  . Cataracts, bilateral    not surgical yet  . Chronic diastolic CHF (congestive heart failure) ([place])   . Chronic respiratory failure ([place])   . Diabetes mellitus without complication ([place])   . Diverticulosis    problems with frequent gas, burping  . Glucose intolerance (impaired glucose tolerance)   . Gout   . History of chicken pox   . Hyperlipidemia   . Hypertension   . NICM (nonischemic cardiomyopathy) ([place]) 2002   EF 25%; improved to normal - echo 4/08: EF 50-55%, mild MR, mild LAE, mild TR;    cath 3/03: normal cors, EF 40%  . Obesity   . PONV (postoperative nausea and vomiting)   . Restrictive lung disease   . Skin cancer    skin cancers only  . Sleep apnea    cpap settings 4   Past Surgical History:  Procedure Laterality Date  . BILATERAL VATS ABLATION Right    biopsy done 2015- [facility]  . BREAST EXCISIONAL BIOPSY Left 80's   NEG  . CARDIAC CATHETERIZATION    . choleystectomy  02/2002  . COLONOSCOPY WITH PROPOFOL N/A 12/20/2014   Procedure: COLONOSCOPY WITH PROPOFOL;  Surgeon: [name], MD;  Location: [facility];   Service: Gastroenterology;  Laterality: N/A;  . CYSTECTOMY  1996   l breast  . DILATION AND CURETTAGE OF UTERUS  2011   Dr. Hulan Fray at [facility]  . [place]    . nsvd     x 2  . TUBAL LIGATION  1975  . VAGINAL DELIVERY     x2   Family History  Problem Relation Age of Onset  . Lymphoma Mother   . Cancer Mother 78       Lymphoma  . COPD Father        was a smoker  . Stroke Father   . Pneumonia Father   . Diabetes Father   . Hyperlipidemia Father   . Heart disease Father   . Heart attack Father   . Hypertension Father   . Diabetes Sister   . Depression Sister   . Meniere's disease Brother   . Diabetes Paternal Grandfather   . Heart disease Paternal Grandfather   . Schizophrenia Daughter   . Cancer Maternal Grandmother        Bladder  . Cancer Maternal Grandfather        leukemia  . Hypertension Brother   . Colon cancer Neg Hx   .  Stomach cancer Neg Hx   . Breast cancer Neg Hx    History  Sexual Activity  . Sexual activity: No    Outpatient Encounter Prescriptions as of 03/28/2017  Medication Sig  . aspirin 81 MG EC tablet Take 81 mg by mouth daily.   . carvedilol (COREG) 12.5 MG tablet TAKE 1 TABLET BY MOUTH TWICE A DAY WITH A MEAL  . Coenzyme Q10 (CO Q 10) 100 MG CAPS Take 100 mg by mouth at bedtime.   . Fish Oil OIL Take 227 mcg by mouth 2 (two) times daily. GUMMIES  . furosemide (LASIX) 40 MG tablet Take 1 tablet (40 mg total) by mouth daily. Take extra tablet once daily as needed for weight gain or swelling.  Marland Kitchen losartan (COZAAR) 50 MG tablet TAKE 0.5 TABLETS (25 MG TOTAL) BY MOUTH DAILY.  . multivitamin (THERAGRAN) per tablet Take 1 tablet by mouth daily.   Marland Kitchen OVER THE COUNTER MEDICATION Take 2 capsules by mouth 2 (two) times daily.  . polyvinyl alcohol (LIQUIFILM TEARS) 1.4 % ophthalmic solution Place 1 drop into both eyes every morning.  . potassium chloride SA (KLOR-CON M20) 20 MEQ tablet TAKE 2 TABLETS EVERY MORNING AND 1 TABLET EACH EVENING  .  simvastatin (ZOCOR) 20 MG tablet TAKE 1 TABLET BY MOUTH AT BEDTIME.   No facility-administered encounter medications on file as of 03/28/2017.     Activities of Daily Living In your present state of health, do you have any difficulty performing the following activities: 03/28/2016  Hearing? N  Vision? N  Difficulty concentrating or making decisions? N  Walking or climbing stairs? N  Dressing or bathing? N  Doing errands, shopping? N  Preparing Food and eating ? N  Using the Toilet? N  In the past six months, have you accidently leaked urine? N  Do you have problems with loss of bowel control? N  Managing your Medications? N  Managing your Finances? N  Housekeeping or managing your Housekeeping? N  Some recent data might be hidden    Patient Care Team: Leone Haven, MD as PCP - General (Family Medicine) Dasher, Rayvon Char, MD (Dermatology) Juanito Doom, MD as Referring Physician (Internal Medicine)    Assessment:    This is a routine wellness examination for Betty [name]. The goal of the wellness visit is to assist the patient how to close the gaps in care and create a preventative care plan for the patient.   The roster of all physicians providing medical care to patient is listed in the Snapshot section of the chart.  Taking calcium VIT D as appropriate/Osteoporosis risk reviewed.    Safety issues reviewed; Smoke and carbon monoxide detectors in the home. No firearms in the home.  Wears seatbelts when driving or riding with others. Patient does wear sunscreen or protective clothing when in direct sunlight. No violence in the home.  Depression- PHQ 2 &9 complete.  No signs/symptoms or verbal communication regarding little pleasure in doing things, feeling down, depressed or hopeless. No changes in sleeping, energy, eating, concentrating.  No thoughts of self harm or harm towards others.  Time spent on this topic is 10 minutes.   Patient is alert, normal appearance,  oriented to person/place/and time.  Correctly identified the president of the [place], recall of 3/3 words, and performing simple calculations. Displays appropriate judgement and can read correct time from watch face.   No new identified risk were noted.  No failures at ADL's or IADL's.  BMI- discussed the importance of a healthy diet, water intake and the benefits of aerobic exercise. Educational material provided.   24 hour diet recall: Breakfast: cereal, fruit Lunch: grilled chicken salad Dinner: chicken salald Snack: nuts Daily fluid intake: 0 cups of caffeine, 4 cups of water.  Directed fluid restriction.  Dental- every 6 months.  Dr. Bronson Curb.  Eye- Visual acuity not assessed per patient preference since they have regular follow up with the ophthalmologist.  Wears corrective lenses.  Sleep patterns- Sleeps 8 hours at night.  Wakes feeling rested. CPAP with oxygen in use.  Diabetes- followed by PCP.  Encouraged to schedule a diabetic follow up.  Patient Concerns: None at this time. Follow up with PCP as needed.  Exercise Activities and Dietary recommendations Current Exercise Habits: Structured exercise class, Time (Minutes): 60, Intensity: Moderate  Goals    . Healthy Lifestyle          Stay hydrated Stay active Low carb foods      Fall Risk Fall Risk  03/28/2017 11/13/2016 03/28/2016 01/27/2016 01/26/2015  Falls in the past year? No No No No No   Depression Screen PHQ 2/9 Scores 03/28/2017 11/13/2016 03/28/2016 01/27/2016  PHQ - 2 Score 0 0 0 0  PHQ- 9 Score 0 2 - -     Cognitive Function MMSE - Mini Mental State Exam 03/28/2017 03/28/2016  Orientation to time 5 5  Orientation to Place 5 5  Registration 3 3  Attention/ Calculation 5 5  Recall 3 3  Language- name 2 objects 2 2  Language- repeat 1 1  Language- follow 3 step command 3 3  Language- read & follow direction 1 1  Write a sentence 1 1  Copy design 1 1  Total score 30 30        Immunization History    Administered Date(s) Administered  . H1N1 09/08/2008  . Influenza Split 06/28/2011, 05/01/2012  . Influenza Whole 07/11/2007, 06/07/2008, 07/12/2009  . Influenza,inj,Quad PF,36+ Mos 04/23/2014, 04/21/2015  . Influenza-Unspecified 04/26/2013, 05/04/2016  . Pneumococcal Conjugate-13 01/21/2014  . Pneumococcal Polysaccharide-23 03/06/2012  . Td 10/03/2007, 10/14/2013  . Zoster 06/21/2010   Screening Tests Health Maintenance  Topic Date Due  . FOOT EXAM  01/26/2017  . HEMOGLOBIN A1C  01/28/2017  . INFLUENZA VACCINE  03/27/2017  . OPHTHALMOLOGY EXAM  05/04/2017  . COLONOSCOPY  12/19/2017  . MAMMOGRAM  02/05/2019  . TETANUS/TDAP  10/15/2023  . DEXA SCAN  Completed  . Hepatitis C Screening  Completed  . PNA vac Low Risk Adult  Completed      Plan:   End of life planning; Advanced aging; Advanced directives discussed.  No HCPOA/Living Will.  Additional information declined at this time.  I have personally reviewed and noted the following in the patient's chart:   . Medical and social history . Use of alcohol, tobacco or illicit drugs  . Current medications and supplements . Functional ability and status . Nutritional status . Physical activity . Advanced directives . List of other physicians . Hospitalizations, surgeries, and ER visits in previous 12 months . Vitals . Screenings to include cognitive, depression, and falls . Referrals and appointments  In addition, I have reviewed and discussed with patient certain preventive protocols, quality metrics, and best practice recommendations. A written personalized care plan for preventive services as well as general preventive health recommendations were provided to patient.     Varney Biles, LPN  01/02/5637   Reviewed above information.  Agree with assessment and  plan.  Dr Nicki Reaper

## 2017-04-17 ENCOUNTER — Other Ambulatory Visit: Payer: Self-pay | Admitting: Cardiology

## 2017-04-24 ENCOUNTER — Encounter: Payer: Self-pay | Admitting: Family Medicine

## 2017-04-24 ENCOUNTER — Ambulatory Visit (INDEPENDENT_AMBULATORY_CARE_PROVIDER_SITE_OTHER): Payer: Medicare Other | Admitting: Family Medicine

## 2017-04-24 VITALS — BP 104/62 | HR 78 | Temp 98.3°F | Wt 187.2 lb

## 2017-04-24 DIAGNOSIS — I5032 Chronic diastolic (congestive) heart failure: Secondary | ICD-10-CM

## 2017-04-24 DIAGNOSIS — E782 Mixed hyperlipidemia: Secondary | ICD-10-CM

## 2017-04-24 DIAGNOSIS — M109 Gout, unspecified: Secondary | ICD-10-CM

## 2017-04-24 DIAGNOSIS — J9611 Chronic respiratory failure with hypoxia: Secondary | ICD-10-CM

## 2017-04-24 DIAGNOSIS — E119 Type 2 diabetes mellitus without complications: Secondary | ICD-10-CM

## 2017-04-24 IMAGING — MG MM DIGITAL SCREENING BILAT W/ TOMO W/ CAD
8 of 12 series · 8 of 28 positions shown · non-contrast
Comparison: Previous exam(s).

CLINICAL DATA: Screening.

EXAM:
2D DIGITAL SCREENING BILATERAL MAMMOGRAM WITH CAD AND ADJUNCT TOMO

[L MLO synth-2D]
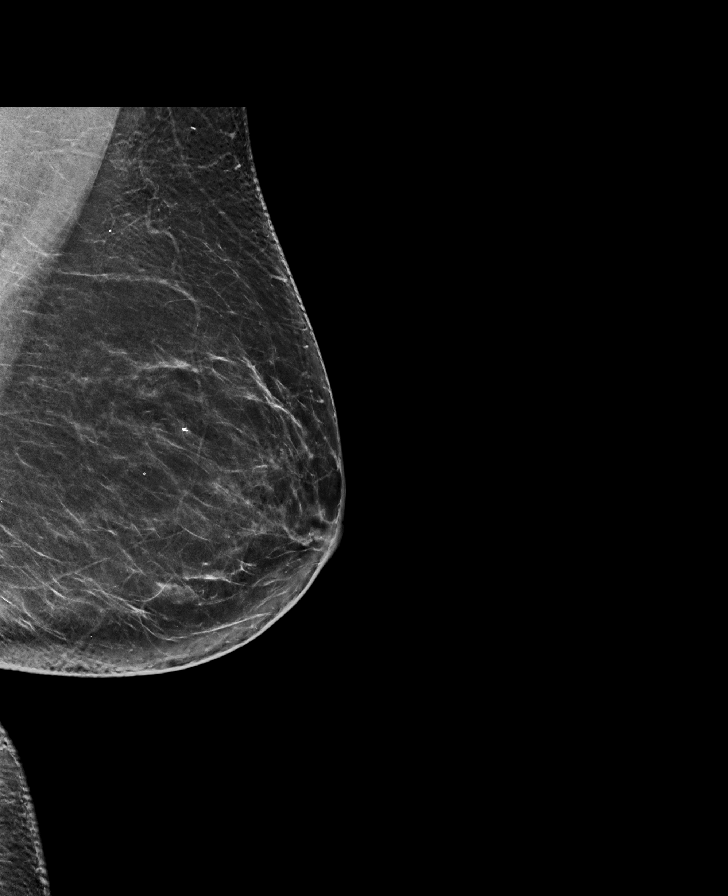

[R CC synth-2D]
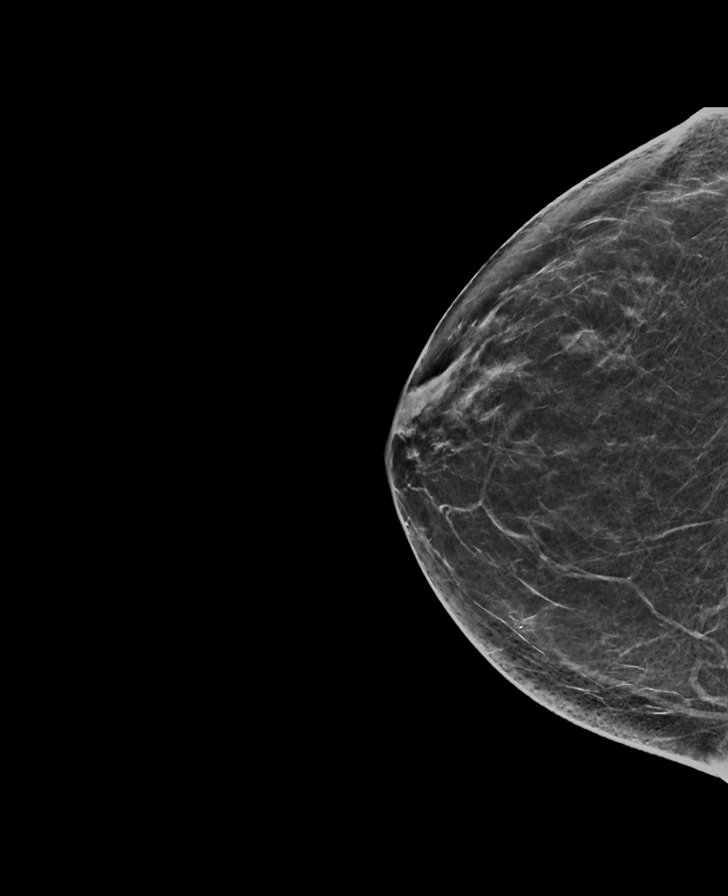

[R MLO]
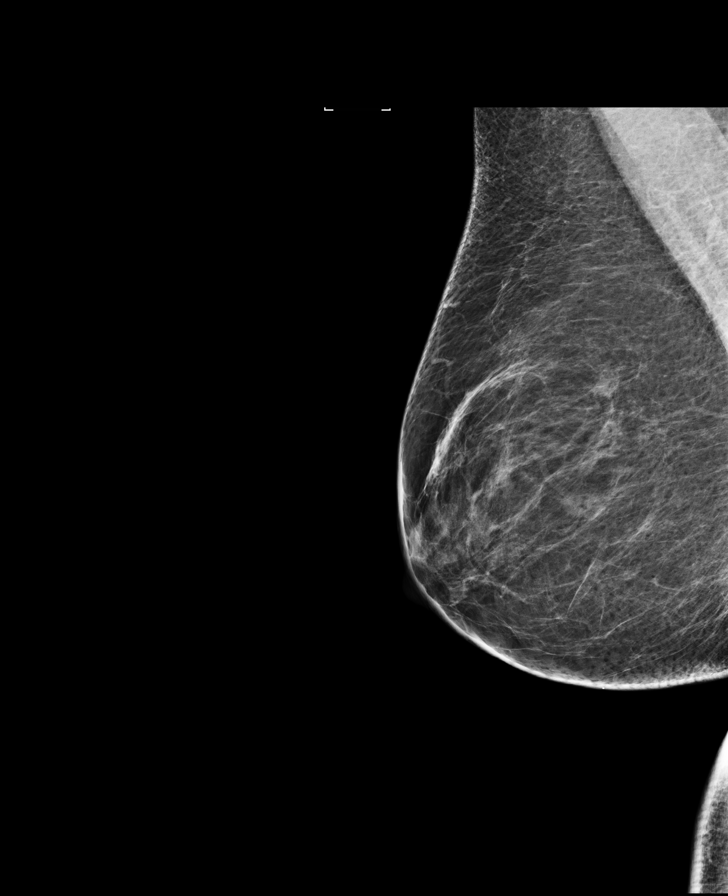

[R CC]
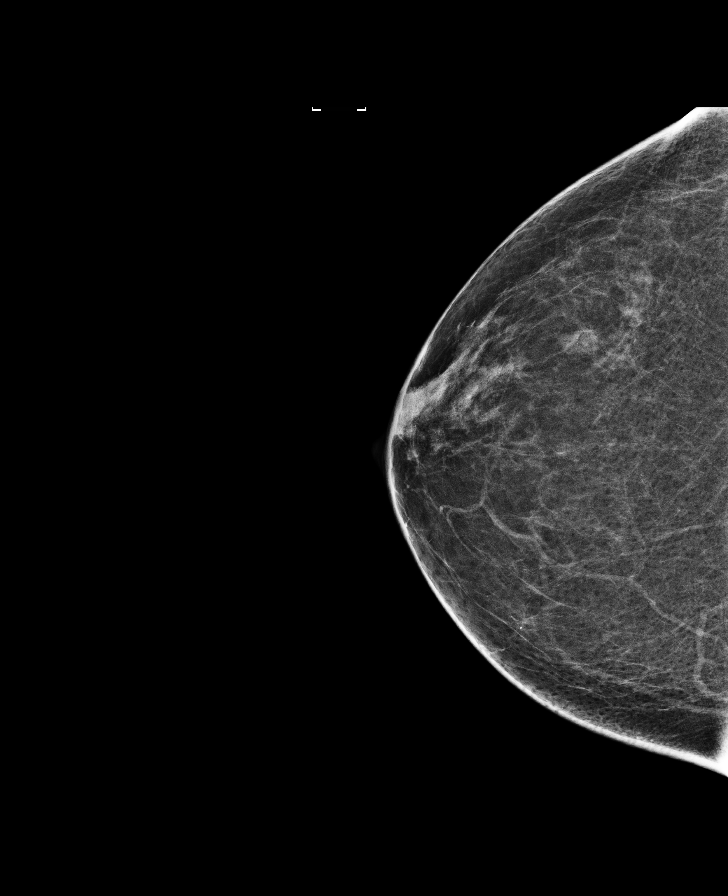

[L CC synth-2D]
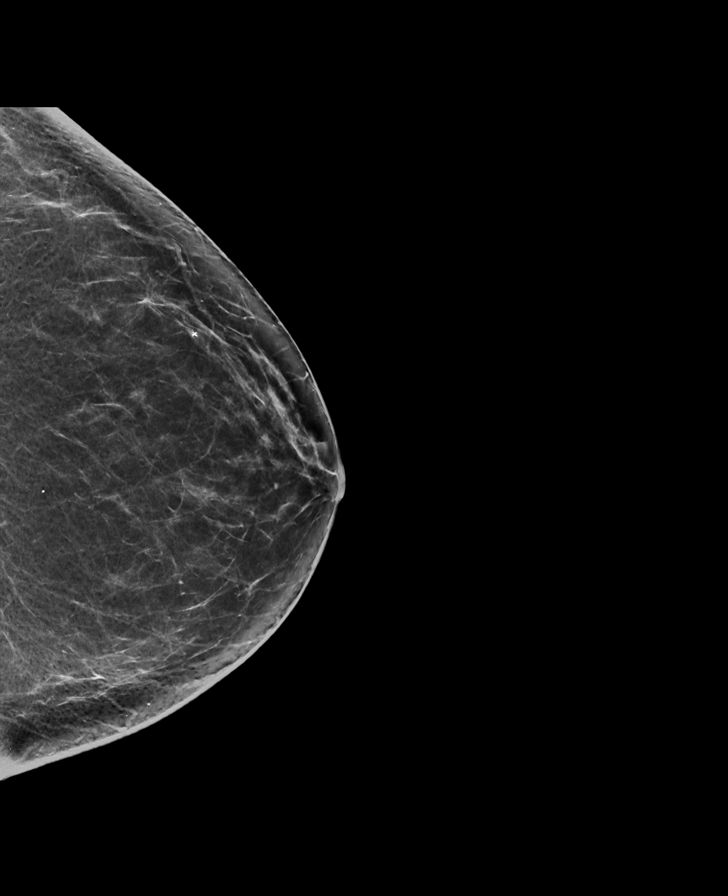

[R MLO synth-2D]
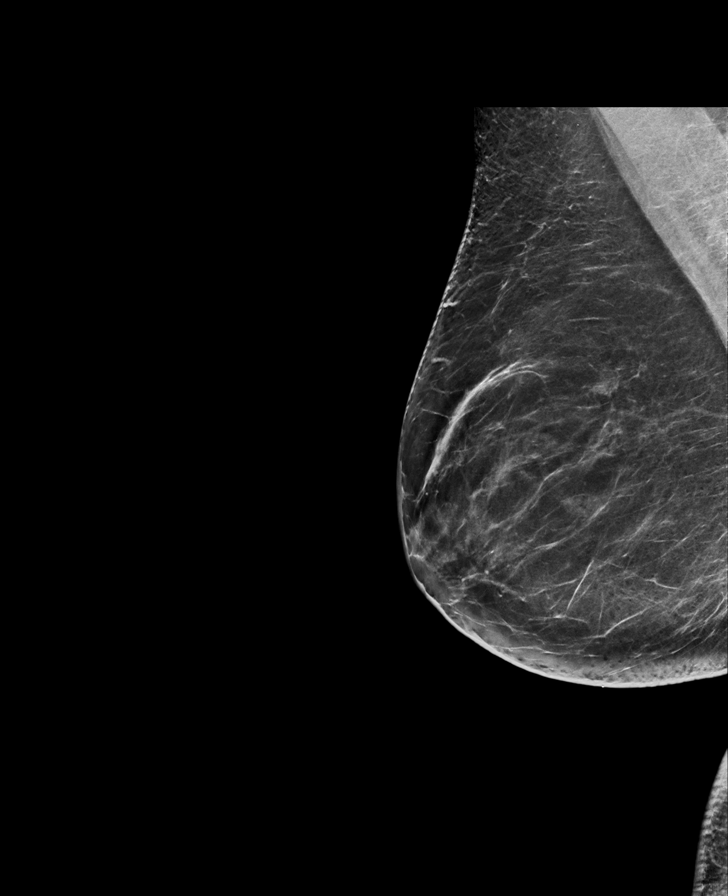

[L MLO]
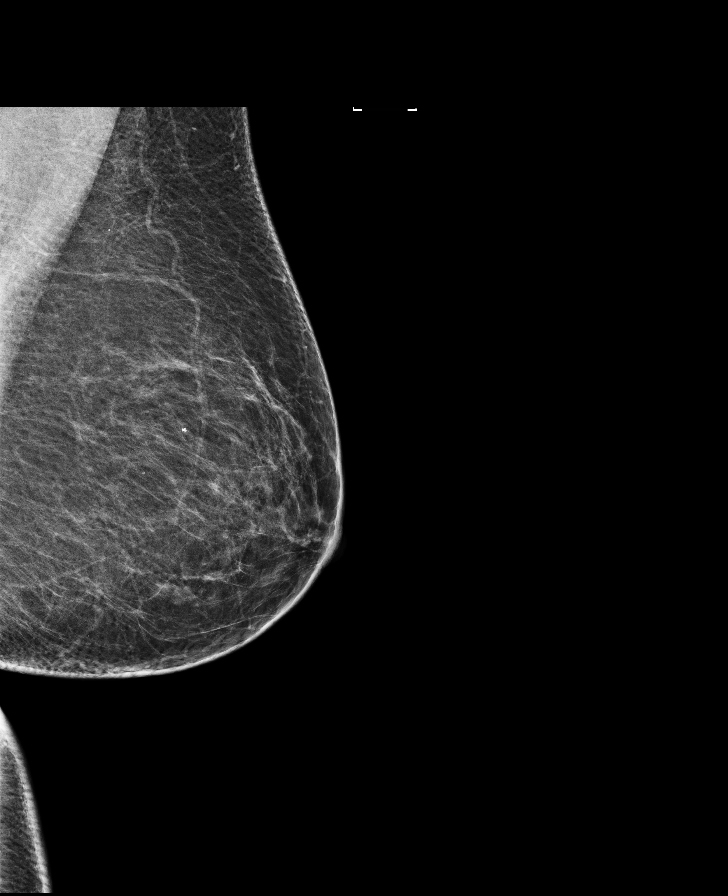

[L CC]
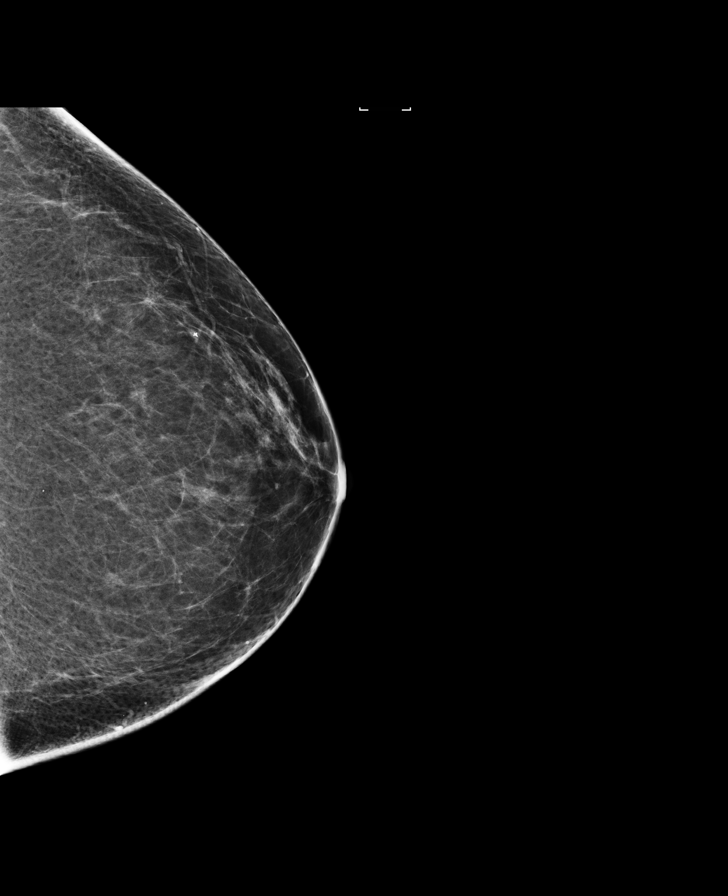

[8 of 28 positions shown; findings below may reference images not displayed]

ACR Breast Density Category b: There are scattered areas of
fibroglandular density.
FINDINGS: There are no findings suspicious for malignancy. Images were
processed with CAD.
IMPRESSION: No mammographic evidence of malignancy. A result letter of this
screening mammogram will be mailed directly to the patient.

RECOMMENDATION:
Screening mammogram in one year. (Code:97-6-RS4)

BI-RADS CATEGORY  1: Negative.

## 2017-04-24 NOTE — Assessment & Plan Note (Signed)
Doing well. Continue to follow with heart failure clinic.

## 2017-04-24 NOTE — Assessment & Plan Note (Signed)
Check lipid panel  

## 2017-04-24 NOTE — Assessment & Plan Note (Signed)
Has been controlled. She'll return for fasting lab work.

## 2017-04-24 NOTE — Assessment & Plan Note (Signed)
Doing well. Continue to follow with pulmonology.

## 2017-04-24 NOTE — Patient Instructions (Addendum)
Nice to see you. We will have you return for fasting lab work. Please monitor your gout and if it gets worse or does not continue to improve please let us know.

## 2017-04-24 NOTE — Progress Notes (Signed)
  Tommi Rumps, MD Phone: [phone]  Betty [name] is a 71 y.o. female who presents today for f/u.  Diabetes: Diet and exercise controlled. She is doing cardiac rehabilitation and walking. Eating very healthily. No polyuria or polydipsia.  Hyperlipidemia: Taking simvastatin. No chest pain. Notes her breathing is good. Rare edema. No myalgias or abdominal pain.  She's not had to use her portable oxygen recently. She's been following with pulmonology and the advanced heart failure clinic for her heart failure and pulmonary issues. She uses oxygen at night.  Gout: She gets this in her left first MTP joint. She currently has a flare. Only gets a flareup once a year. Colchicine helped significantly. She is taking 2 doses of this with good benefit. Minimal if any discomfort at this time.  PMH: nonsmoker.   ROS see history of present illness  Objective  Physical Exam Vitals:   04/24/17 1409  BP: 104/62  Pulse: 78  Temp: 98.3 F (36.8 C)  SpO2: 92%    BP Readings from Last 3 Encounters:  04/24/17 104/62  03/28/17 108/62  11/21/16 112/62   Wt Readings from Last 3 Encounters:  04/24/17 187 lb 3.2 oz (84.9 kg)  03/28/17 184 lb 6.4 oz (83.6 kg)  11/21/16 185 lb (83.9 kg)    Physical Exam  Constitutional: No distress.  Cardiovascular: Normal rate, regular rhythm and normal heart sounds.   Pulmonary/Chest: Effort normal and breath sounds normal.  Musculoskeletal: She exhibits no edema.  Bilateral first MTP joints with no tenderness, swelling, warmth, or erythema  Neurological: She is alert. Gait normal.  Skin: Skin is warm and dry. She is not diaphoretic.     Assessment/Plan: Please see individual problem list.  Chronic diastolic heart failure ([place]) Doing well. Continue to follow with heart failure clinic.  Chronic hypoxemic respiratory failure ([facility]) Doing well. Continue to follow with pulmonology.  Diabetes mellitus type 2, controlled Has been controlled.  She'll return for fasting lab work.  GOUT Rarely has a flare. Has responded well today and yesterday to colchicine. She'll continue to monitor. Discussed if she has recurrent flares she should let us know.  HYPERLIPIDEMIA, MIXED Check lipid panel.   Orders Placed This Encounter  Procedures  . HgB A1c    Standing Status:   Future    Standing Expiration Date:   04/24/2018  . Comp Met (CMET)    Standing Status:   Future    Standing Expiration Date:   04/24/2018  . Lipid panel    Standing Status:   Future    Standing Expiration Date:   04/24/2018   Tommi Rumps, MD [place]

## 2017-04-24 NOTE — Assessment & Plan Note (Signed)
Rarely has a flare. Has responded well today and yesterday to colchicine. She'll continue to monitor. Discussed if she has recurrent flares she should let us know.

## 2017-04-30 ENCOUNTER — Other Ambulatory Visit (INDEPENDENT_AMBULATORY_CARE_PROVIDER_SITE_OTHER): Payer: Medicare Other

## 2017-04-30 DIAGNOSIS — E119 Type 2 diabetes mellitus without complications: Secondary | ICD-10-CM | POA: Diagnosis not present

## 2017-04-30 DIAGNOSIS — E782 Mixed hyperlipidemia: Secondary | ICD-10-CM

## 2017-04-30 LAB — COMPREHENSIVE METABOLIC PANEL
ALK PHOS: 71 U/L (ref 39–117)
ALT: 17 U/L (ref 0–35)
AST: 19 U/L (ref 0–37)
Albumin: 4 g/dL (ref 3.5–5.2)
BILIRUBIN TOTAL: 0.5 mg/dL (ref 0.2–1.2)
BUN: 18 mg/dL (ref 6–23)
CO2: 31 meq/L (ref 19–32)
Calcium: 9.6 mg/dL (ref 8.4–10.5)
Chloride: 100 mEq/L (ref 96–112)
Creatinine, Ser: 0.62 mg/dL (ref 0.40–1.20)
GFR: 101.08 mL/min (ref >60.00)
GLUCOSE: 98 mg/dL (ref 70–99)
Potassium: 4.1 mEq/L (ref 3.5–5.1)
Sodium: 140 mEq/L (ref 135–145)
TOTAL PROTEIN: 7.2 g/dL (ref 6.0–8.3)

## 2017-04-30 LAB — HEMOGLOBIN A1C: HEMOGLOBIN A1C: 6.4 % (ref 4.6–6.5)

## 2017-04-30 LAB — LIPID PANEL
CHOL/HDL RATIO: 3
Cholesterol: 159 mg/dL (ref 0–200)
HDL: 62.5 mg/dL (ref >39.00)
LDL Cholesterol: 58 mg/dL (ref 0–99)
NONHDL: 96.2
Triglycerides: 192 mg/dL — ABNORMAL HIGH (ref 0.0–149.0)
VLDL: 38.4 mg/dL (ref 0.0–40.0)

## 2017-06-04 ENCOUNTER — Other Ambulatory Visit (HOSPITAL_COMMUNITY): Payer: Self-pay | Admitting: *Deleted

## 2017-06-04 MED ORDER — POTASSIUM CHLORIDE CRYS ER 20 MEQ PO TBCR: EXTENDED_RELEASE_TABLET | ORAL | 0 refills | Status: DC

## 2017-06-05 ENCOUNTER — Other Ambulatory Visit (HOSPITAL_COMMUNITY): Payer: Self-pay | Admitting: Internal Medicine

## 2017-06-06 ENCOUNTER — Other Ambulatory Visit (HOSPITAL_COMMUNITY): Payer: Self-pay | Admitting: *Deleted

## 2017-08-08 ENCOUNTER — Other Ambulatory Visit (HOSPITAL_COMMUNITY): Payer: Self-pay | Admitting: Internal Medicine

## 2017-08-16 ENCOUNTER — Other Ambulatory Visit (HOSPITAL_COMMUNITY): Payer: Self-pay | Admitting: Internal Medicine

## 2017-09-06 ENCOUNTER — Other Ambulatory Visit (HOSPITAL_COMMUNITY): Payer: Self-pay | Admitting: Internal Medicine

## 2017-09-09 ENCOUNTER — Ambulatory Visit: Payer: Medicare Other | Admitting: Family Medicine

## 2017-09-10 ENCOUNTER — Telehealth: Payer: Self-pay | Admitting: Family Medicine

## 2017-09-10 ENCOUNTER — Other Ambulatory Visit (HOSPITAL_COMMUNITY): Payer: Self-pay | Admitting: Internal Medicine

## 2017-09-10 MED ORDER — POTASSIUM CHLORIDE CRYS ER 20 MEQ PO TBCR: EXTENDED_RELEASE_TABLET | ORAL | 0 refills | Status: DC

## 2017-09-10 NOTE — Telephone Encounter (Signed)
Please advise 

## 2017-09-10 NOTE — Telephone Encounter (Signed)
One-time refill given.  It appears this has previously been prescribed by her heart failure physician.  Future refills can come from him.

## 2017-09-10 NOTE — Telephone Encounter (Signed)
Copied from [place] [id number]. Topic: Quick Communication - See Telephone Encounter >> Sep 10, 2017 10:17 AM Bea Graff, NT wrote: CRM for notification. See Telephone encounter for: Pt calling requesting 35 tablets of potassium chloride SA until her appt on 09/24/17. Uses [facility] [address] in [place]  09/10/17.

## 2017-09-10 NOTE — Telephone Encounter (Signed)
Requesting refill of 35 tablets of Potassium SA until her appt. of 09/23/17  LOV 04/24/17 with Dr. Caryl Bis  Last CMET 04/30/17 with K+ of 4.1

## 2017-09-11 NOTE — Telephone Encounter (Signed)
Left message to return call to notify patient we did refill the potassium but this refill should from now on come from her heart failure doctor

## 2017-09-19 ENCOUNTER — Ambulatory Visit (HOSPITAL_COMMUNITY)
Admission: RE | Admit: 2017-09-19 | Discharge: 2017-09-19 | Disposition: A | Discharge status: Home / Self Care | Payer: Medicare Other | Source: Ambulatory Visit | Attending: Internal Medicine | Admitting: Internal Medicine

## 2017-09-19 ENCOUNTER — Encounter (HOSPITAL_COMMUNITY): Payer: Self-pay

## 2017-09-19 VITALS — BP 132/94 | HR 85 | Wt 196.0 lb

## 2017-09-19 DIAGNOSIS — Z833 Family history of diabetes mellitus: Secondary | ICD-10-CM | POA: Diagnosis not present

## 2017-09-19 DIAGNOSIS — Z823 Family history of stroke: Secondary | ICD-10-CM | POA: Insufficient documentation

## 2017-09-19 DIAGNOSIS — E119 Type 2 diabetes mellitus without complications: Secondary | ICD-10-CM | POA: Diagnosis not present

## 2017-09-19 DIAGNOSIS — I110 Hypertensive heart disease with heart failure: Secondary | ICD-10-CM | POA: Diagnosis not present

## 2017-09-19 DIAGNOSIS — Z9889 Other specified postprocedural states: Secondary | ICD-10-CM | POA: Diagnosis not present

## 2017-09-19 DIAGNOSIS — Z85828 Personal history of other malignant neoplasm of skin: Secondary | ICD-10-CM | POA: Diagnosis not present

## 2017-09-19 DIAGNOSIS — Z7982 Long term (current) use of aspirin: Secondary | ICD-10-CM | POA: Diagnosis not present

## 2017-09-19 DIAGNOSIS — R0600 Dyspnea, unspecified: Secondary | ICD-10-CM | POA: Diagnosis not present

## 2017-09-19 DIAGNOSIS — I5032 Chronic diastolic (congestive) heart failure: Secondary | ICD-10-CM | POA: Insufficient documentation

## 2017-09-19 DIAGNOSIS — J984 Other disorders of lung: Secondary | ICD-10-CM

## 2017-09-19 DIAGNOSIS — Z809 Family history of malignant neoplasm, unspecified: Secondary | ICD-10-CM | POA: Insufficient documentation

## 2017-09-19 DIAGNOSIS — M1990 Unspecified osteoarthritis, unspecified site: Secondary | ICD-10-CM | POA: Diagnosis not present

## 2017-09-19 DIAGNOSIS — J9611 Chronic respiratory failure with hypoxia: Secondary | ICD-10-CM | POA: Diagnosis not present

## 2017-09-19 DIAGNOSIS — Z880 Allergy status to penicillin: Secondary | ICD-10-CM | POA: Diagnosis not present

## 2017-09-19 DIAGNOSIS — Z8249 Family history of ischemic heart disease and other diseases of the circulatory system: Secondary | ICD-10-CM | POA: Insufficient documentation

## 2017-09-19 DIAGNOSIS — E785 Hyperlipidemia, unspecified: Secondary | ICD-10-CM | POA: Diagnosis not present

## 2017-09-19 DIAGNOSIS — Z9981 Dependence on supplemental oxygen: Secondary | ICD-10-CM | POA: Diagnosis not present

## 2017-09-19 DIAGNOSIS — M109 Gout, unspecified: Secondary | ICD-10-CM | POA: Insufficient documentation

## 2017-09-19 DIAGNOSIS — G4733 Obstructive sleep apnea (adult) (pediatric): Secondary | ICD-10-CM | POA: Insufficient documentation

## 2017-09-19 DIAGNOSIS — E669 Obesity, unspecified: Secondary | ICD-10-CM | POA: Insufficient documentation

## 2017-09-19 DIAGNOSIS — Z882 Allergy status to sulfonamides status: Secondary | ICD-10-CM | POA: Insufficient documentation

## 2017-09-19 DIAGNOSIS — Z836 Family history of other diseases of the respiratory system: Secondary | ICD-10-CM | POA: Diagnosis not present

## 2017-09-19 DIAGNOSIS — Z79899 Other long term (current) drug therapy: Secondary | ICD-10-CM | POA: Diagnosis not present

## 2017-09-19 DIAGNOSIS — Z888 Allergy status to other drugs, medicaments and biological substances status: Secondary | ICD-10-CM | POA: Diagnosis not present

## 2017-09-19 DIAGNOSIS — R0609 Other forms of dyspnea: Secondary | ICD-10-CM | POA: Diagnosis not present

## 2017-09-19 DIAGNOSIS — Z818 Family history of other mental and behavioral disorders: Secondary | ICD-10-CM | POA: Insufficient documentation

## 2017-09-19 DIAGNOSIS — I429 Cardiomyopathy, unspecified: Secondary | ICD-10-CM | POA: Insufficient documentation

## 2017-09-19 DIAGNOSIS — Z807 Family history of other malignant neoplasms of lymphoid, hematopoietic and related tissues: Secondary | ICD-10-CM | POA: Insufficient documentation

## 2017-09-19 DIAGNOSIS — Z6832 Body mass index (BMI) 32.0-32.9, adult: Secondary | ICD-10-CM | POA: Diagnosis not present

## 2017-09-19 DIAGNOSIS — Z8352 Family history of ear disorders: Secondary | ICD-10-CM | POA: Insufficient documentation

## 2017-09-19 LAB — BASIC METABOLIC PANEL
Anion gap: 11 (ref 5–15)
BUN: 14 mg/dL (ref 6–20)
CHLORIDE: 104 mmol/L (ref 101–111)
CO2: 24 mmol/L (ref 22–32)
CREATININE: 0.61 mg/dL (ref 0.44–1.00)
Calcium: 9.3 mg/dL (ref 8.9–10.3)
GFR calc Af Amer: >60 mL/min (ref >60)
GFR calc non Af Amer: >60 mL/min (ref >60)
Glucose, Bld: 107 mg/dL — ABNORMAL HIGH (ref 65–99)
Potassium: 4.3 mmol/L (ref 3.5–5.1)
Sodium: 139 mmol/L (ref 135–145)

## 2017-09-19 LAB — CBC
HEMATOCRIT: 39.6 % (ref 36.0–46.0)
Hemoglobin: 13.1 g/dL (ref 12.0–15.0)
MCH: 32.6 pg (ref 26.0–34.0)
MCHC: 33.1 g/dL (ref 30.0–36.0)
MCV: 98.5 fL (ref 78.0–100.0)
Platelets: 221 10*3/uL (ref 150–400)
RBC: 4.02 MIL/uL (ref 3.87–5.11)
RDW: 13 % (ref 11.5–15.5)
WBC: 7.5 10*3/uL (ref 4.0–10.5)

## 2017-09-19 LAB — BRAIN NATRIURETIC PEPTIDE: B Natriuretic Peptide: 30.7 pg/mL (ref 0.0–100.0)

## 2017-09-19 MED ORDER — FUROSEMIDE 40 MG PO TABS: ORAL_TABLET | ORAL | 2 refills | Status: DC

## 2017-09-19 MED ORDER — LOSARTAN POTASSIUM 50 MG PO TABS: 25.0000 mg | ORAL_TABLET | Freq: Every day | ORAL | 3 refills | Status: DC

## 2017-09-19 MED ORDER — ASPIRIN 81 MG PO TBEC: 81.0000 mg | DELAYED_RELEASE_TABLET | Freq: Every day | ORAL | 2 refills | Status: AC

## 2017-09-19 MED ORDER — POTASSIUM CHLORIDE CRYS ER 20 MEQ PO TBCR: EXTENDED_RELEASE_TABLET | ORAL | 6 refills | Status: DC

## 2017-09-19 MED ORDER — CARVEDILOL 12.5 MG PO TABS: ORAL_TABLET | ORAL | 2 refills | Status: DC

## 2017-09-19 MED ORDER — SIMVASTATIN 20 MG PO TABS: 20.0000 mg | ORAL_TABLET | Freq: Every day | ORAL | 3 refills | Status: DC

## 2017-09-19 NOTE — Progress Notes (Signed)
Patient ID: MONSERRATH JUNIO, female   DOB: 09-Jun-1946, 72 y.o.   MRN: [id number]    Advanced Heart Failure Clinic Note   Date:  09/19/2017   ID:  Betty [name], Betty [name] 23-Oct-1945, MRN [id number]  PCP:  Leone Haven, MD  Cardiologist:  Dr. Dorris Carnes      History of Present Illness: Betty [name] is a 72 y.o. female with a hx of NICM with previous EF 25% (improved to normal), HL, gout, glucose intol, normal cors by [facility] in 2003, OSA. Referred by Dr. Harrington Challenger for further evaluation of exertional dyspnea.   She developed increasing shortness of breath over the course of 2 years about 2011 to 2013. She had an extensive Pulmonary work-up at [facility] and has been seen by Dr. Lake Bells. Pulmonary function testing showed restrictive lung disease with a markedly depressed DLCO at 35% predicted. CT scanning of her chest showed upper lobe groundglass abnormalities versus air trapping. A home polysomnogram showed an AHI of 12 and multiple desaturation events to 80% independent of apneas. An abg showed resting hypercarbia (pCO2 52), and a MIP/MEP was 36%/28% predicted. A follow up diaphragm study was normal. A repeat CT scan was performed, no pulmonary embolism was seen and radiology commented on atelectasis but no clear intersitial lung disease. Blood work including an HP panel and serologies for connective tissue diseases has been essentially negative (two partial aspergillus bands on HP panel). Two echocardiograms have not shown evidence of pulmonary hypertension. After starting on CPAP with nasal pillows and oxygen with exertion and sleep she started feeling better.  She had an open lung biopsy at [facility] in November of 2014 which showed small amounts of fibrosis around her pulmonary arteries as well as findings consistent with very mild bronchiolitis.  Resting RHC was normal 12/2012 Referred to [facility] (Dr. Gilles Chiquito).  RHC with exercise in 3/15 showed marked increase in pulmonary pressures and  PCWP with exercise with no change in PVR. O2 sats down to 77% with exercise. Corrected with O2. PA rest 36/20 (26)  PCWP 15 with exercise PA 78/40 (56) PCWP 28  This was felt to reflect diastolic dysfunction and intrinsic lung disease.  Lung bx was not suggestive of clear lung pathology.  CT was neg for PE.  Continued management of diastolic CHF has been recommended.     She presents today for regular follow up. Last seen 09/2016.  Has been feeling OK overall. Remains slightly SOB with moderate exertion. She hasn't needed her 02 very much at home. Saw Dr. Lake Bells last year, PFTs had improved. She is able to do her ADLs without much difficult. She has been slightly more SOB over the past 1-2 months, with 15 lb weight gain over the holiday. She has tried several different diets with no real improvement. Denies orthopnea or PND. Feels better when her weight is < 190.   Echo 08/06/16 LVEF 55-60%, Grade 1 DD, Mild TR, PA pressures WNL  Studies:  - RHC (5/14):  RA 9, RV 32/8, PA 30/16/23, PCWP 12, 2.1 WU, CO 5.3, CI 2.7  - Echo (3/14):  EF 55-60%, no RWMA, Gr 1 DD, mild MR  - Nuclear (11/13):  Breast atten, no ischemia, EF 55%; Low Risk   - PFTs. [place] 2014: FVC 1.46 L (48% pred), TLC 2.77 L (55% pred) ERV 0.03 (3% pred), DLCO 8.4 (35% pred)  - PFTs FVC 1.74 (55%), TLC 3.19 (61%), DLCO 15.89 (62%)  Recent Labs/Images: 04/30/2017: ALT 17; Creatinine,  Ser 0.62; HDL 62.50; LDL Cholesterol 58; Potassium 4.1   Wt Readings from Last 3 Encounters:  09/19/17 196 lb (88.9 kg)  04/24/17 187 lb 3.2 oz (84.9 kg)  03/28/17 184 lb 6.4 oz (83.6 kg)     Past Medical History:  Diagnosis Date  . Allergic rhinitis    never tested, fall and spring  . Arthritis    hands,knees, feet  . Cataracts, bilateral    not surgical yet  . Chronic diastolic CHF (congestive heart failure) ([place])   . Chronic respiratory failure ([place])   . Diabetes mellitus without complication ([place])   . Diverticulosis    problems with frequent  gas, burping  . Glucose intolerance (impaired glucose tolerance)   . Gout   . History of chicken pox   . Hyperlipidemia   . Hypertension   . NICM (nonischemic cardiomyopathy) ([place]) 2002   EF 25%; improved to normal - echo 4/08: EF 50-55%, mild MR, mild LAE, mild TR;    cath 3/03: normal cors, EF 40%  . Obesity   . PONV (postoperative nausea and vomiting)   . Restrictive lung disease   . Skin cancer    skin cancers only  . Sleep apnea    cpap settings 4    Current Outpatient Medications  Medication Sig Dispense Refill  . aspirin 81 MG EC tablet Take 81 mg by mouth daily.     . carvedilol (COREG) 12.5 MG tablet TAKE 1 TABLET BY MOUTH TWICE A DAY WITH A MEAL 180 tablet 1  . Coenzyme Q10 (CO Q 10) 100 MG CAPS Take 100 mg by mouth at bedtime.     . Fish Oil OIL Take 227 mcg by mouth 2 (two) times daily. GUMMIES    . furosemide (LASIX) 40 MG tablet TAKE 1 TAB ONCE DAILY. TAKE EXTRA TAB ONCE DAILY AS NEEDED FOR WEIGHT GAIN OR SWELLING 180 tablet 0  . losartan (COZAAR) 50 MG tablet TAKE 0.5 TABLETS (25 MG TOTAL) BY MOUTH DAILY. 90 tablet 0  . multivitamin (THERAGRAN) per tablet Take 1 tablet by mouth daily.     Marland Kitchen OVER THE COUNTER MEDICATION Take 2 capsules by mouth 2 (two) times daily.    . polyvinyl alcohol (LIQUIFILM TEARS) 1.4 % ophthalmic solution Place 1 drop into both eyes every morning.    . potassium chloride SA (KLOR-CON M20) 20 MEQ tablet TAKE 2 TABLETS EVERY MORNING AND 1 TABLET EACH EVENING**NEEDS OFFICE VISIT** 90 tablet 0  . simvastatin (ZOCOR) 20 MG tablet TAKE 1 TABLET BY MOUTH AT BEDTIME. 90 tablet 3   No current facility-administered medications for this encounter.    Allergies:   Amoxicillin; Etodolac; Hctz [hydrochlorothiazide]; Meloxicam; Metformin and related; and Sulfonamide derivatives   Social History:  The patient  reports that  has never smoked. she has never used smokeless tobacco. She reports that she does not drink alcohol or use drugs.   Family History:   The patient's family history includes COPD in her father; Cancer in her maternal grandfather and maternal grandmother; Cancer (age of onset: 74) in her mother; Depression in her sister; Diabetes in her father, paternal grandfather, and sister; Heart attack in her father; Heart disease in her father and paternal grandfather; Hyperlipidemia in her father; Hypertension in her brother and father; Lymphoma in her mother; Meniere's disease in her brother; Pneumonia in her father; Schizophrenia in her daughter; Stroke in her father.   Review of systems complete and found to be negative unless listed in HPI.  PHYSICAL EXAM: Vitals:   09/19/17 1417  BP: (!) 132/94  Pulse: 85  SpO2: 90%  Weight: 196 lb (88.9 kg)   Wt Readings from Last 3 Encounters:  09/19/17 196 lb (88.9 kg)  04/24/17 187 lb 3.2 oz (84.9 kg)  03/28/17 184 lb 6.4 oz (83.6 kg)    General: Well appearing. No resp difficulty. HEENT: Normal Neck: Supple. JVP 5-6. Carotids 2+ bilat; no bruits. No thyromegaly or nodule noted. Cor: PMI nondisplaced. RRR, No M/G/R noted Lungs: CTAB, normal effort. Abdomen: Soft, non-tender, non-distended, no HSM. No bruits or masses. +BS  Extremities: No cyanosis, clubbing, or rash. R and LLE no edema.  Neuro: Alert & orientedx3, cranial nerves grossly intact. moves all 4 extremities w/o difficulty. Affect pleasant   ASSESSMENT AND PLAN:  1. Dyspnea - Should continue to try and lose weight.  2. Chronic hypoxic respiratory failure on home O2 and CPAP  - Using 02 at night. Again encouraged her to check 02 sats with rest and exertion, and use 02 to keep sats above 90%.   - Needs to restart Pulmonary Rehab (stopped over holidays) 3. Chronic diastolic heart failure   - Echo 07/2016 LVEF 55-60% - Volume status stable on exam.  - Continue lasix 40 mg as needed.  4. Restrictive lung physiology - PFTs 10/2016 had improved. 5. Obesity - Encouraged weight loss and pulmonary rehab as above.  6. HTN  -  Continue current meds.   RTC 3 months to see Dr. Haroldine Laws. Discussed with patient. Can consider repeat echo if symptoms do not improve, however she thinks she would feel a lot better losing weight. Recommended [facility] or similar diet.   Shirley Friar, PA-C  09/19/2017   Greater than 50% of the 25 minute visit was spent in counseling/coordination of care regarding disease state education, salt/fluid restriction, sliding scale diuretics, and medication compliance.

## 2017-09-19 NOTE — Patient Instructions (Signed)
It was great to see you today!  Your physician recommends that you schedule a follow-up appointment in: 3 months with Dr Haroldine Laws   Do the following things EVERYDAY: 1) Weigh yourself in the morning before breakfast. Write it down and keep it in a log. 2) Take your medicines as prescribed 3) Eat low salt foods-Limit salt (sodium) to 2000 mg per day.  4) Stay as active as you can everyday 5) Limit all fluids for the day to less than 2 liters

## 2017-09-19 NOTE — Telephone Encounter (Signed)
No return call 

## 2017-09-23 ENCOUNTER — Ambulatory Visit: Payer: Medicare Other | Admitting: Family Medicine

## 2017-10-25 ENCOUNTER — Ambulatory Visit: Payer: Medicare Other | Admitting: Family Medicine

## 2017-11-11 ENCOUNTER — Ambulatory Visit: Payer: Medicare Other | Admitting: Pulmonary Disease

## 2017-11-15 ENCOUNTER — Encounter: Payer: Self-pay | Admitting: Family Medicine

## 2017-11-15 ENCOUNTER — Telehealth: Payer: Self-pay | Admitting: *Deleted

## 2017-11-15 ENCOUNTER — Ambulatory Visit: Payer: Medicare Other | Admitting: Family Medicine

## 2017-11-15 ENCOUNTER — Other Ambulatory Visit: Payer: Self-pay

## 2017-11-15 VITALS — BP 100/64 | HR 70 | Temp 98.3°F | Wt 189.0 lb

## 2017-11-15 DIAGNOSIS — E119 Type 2 diabetes mellitus without complications: Secondary | ICD-10-CM | POA: Diagnosis not present

## 2017-11-15 DIAGNOSIS — J9611 Chronic respiratory failure with hypoxia: Secondary | ICD-10-CM | POA: Diagnosis not present

## 2017-11-15 DIAGNOSIS — I5032 Chronic diastolic (congestive) heart failure: Secondary | ICD-10-CM

## 2017-11-15 DIAGNOSIS — M109 Gout, unspecified: Secondary | ICD-10-CM

## 2017-11-15 LAB — URIC ACID: URIC ACID, SERUM: 9 mg/dL — AB (ref 2.4–7.0)

## 2017-11-15 NOTE — Progress Notes (Signed)
  Tommi Rumps, MD Phone: [phone]  Betty [name] is a 72 y.o. female who presents today for follow-up.  DIABETES Disease Monitoring: Blood Sugar ranges-not checking Polyuria/phagia/dipsia- no      Optho- due to see soon Medications: Compliance- not on meds    Hypoglycemic symptoms- no Patient's working on diet and exercise.  Doing the [employer].  Going to the gym 2 days a week.  Cardiomyopathy: Follows with Dr. Haroldine Laws at [facility].  She notes she is doing well.  No orthopnea, PND, or edema.  Continues on carvedilol, Lasix, and losartan.  She follows with Dr. Lake Bells for her chronic respiratory failure.  She wears oxygen at night with her CPAP at 3 L.  Has not had to use her oxygen during the day recently.  Feels like her breathing is doing well and is stable.  Gout: Reports a flare every 3-4 months in her left first MTP joint.  Typically takes colchicine that her husband has and this knocks it out.  She was on allopurinol previously though none recently.  No current flare.   Social History   Tobacco Use  Smoking Status Never Smoker  Smokeless Tobacco Never Used     ROS see history of present illness  Objective  Physical Exam Vitals:   11/15/17 0956  BP: 100/64  Pulse: 70  Temp: 98.3 F (36.8 C)  SpO2: 92%    BP Readings from Last 3 Encounters:  11/15/17 100/64  09/19/17 (!) 132/94  04/24/17 104/62   Wt Readings from Last 3 Encounters:  11/15/17 189 lb (85.7 kg)  09/19/17 196 lb (88.9 kg)  04/24/17 187 lb 3.2 oz (84.9 kg)    Physical Exam  Constitutional: No distress.  Cardiovascular: Normal rate, regular rhythm and normal heart sounds.  Pulmonary/Chest: Effort normal and breath sounds normal.  Musculoskeletal: She exhibits no edema.  No evidence of gout flare on exam  Neurological: She is alert. Gait normal.  Skin: Skin is warm and dry. She is not diaphoretic.   Diabetic Foot Exam - Simple   Simple Foot Form Diabetic Foot exam was  performed with the following findings:  Yes 11/15/2017 10:16 AM  Visual Inspection No deformities, no ulcerations, no other skin breakdown bilaterally:  Yes Sensation Testing See comments:  Yes Pulse Check Posterior Tibialis and Dorsalis pulse intact bilaterally:  Yes Comments Increased monofilament testing on the plantar surface of both feet, otherwise intact sensation to light touch      Assessment/Plan: Please see individual problem list.  Diabetes mellitus type 2, controlled ([place]) Due for A1c.  Encouraged her to see her ophthalmologist for yearly check.  Foot exam completed.  She will continue to work on diet and exercise.  Chronic diastolic heart failure (HCC) Overall doing well.  She will continue to follow with heart failure clinic.  GOUT Intermittent gout flares.  Will check uric acid and consider placing back on allopurinol if necessary.  Chronic hypoxemic respiratory failure ([place]) She continues to do quite well.  She has been exercising.  No oxygen use during the day.  She will see Dr. Lake Bells in follow-up soon.   Orders Placed This Encounter  Procedures  . Uric acid  . HgB A1c    No orders of the defined types were placed in this encounter.    Tommi Rumps, MD [place]

## 2017-11-15 NOTE — Assessment & Plan Note (Signed)
Intermittent gout flares.  Will check uric acid and consider placing back on allopurinol if necessary.

## 2017-11-15 NOTE — Assessment & Plan Note (Signed)
Due for A1c.  Encouraged her to see her ophthalmologist for yearly check.  Foot exam completed.  She will continue to work on diet and exercise.

## 2017-11-15 NOTE — Assessment & Plan Note (Signed)
She continues to do quite well.  She has been exercising.  No oxygen use during the day.  She will see Dr. Lake Bells in follow-up soon.

## 2017-11-15 NOTE — Addendum Note (Signed)
Addended by: Leeanne Rio on: 11/15/2017 03:13 PM   Modules accepted: Orders

## 2017-11-15 NOTE — Assessment & Plan Note (Signed)
Overall doing well.  She will continue to follow with heart failure clinic.

## 2017-11-15 NOTE — Telephone Encounter (Signed)
A1C was not drawn in error. Please let me know if pt needs to return for it. A POCT can be done also if okay with you.

## 2017-11-15 NOTE — Telephone Encounter (Signed)
It is okay to have her come back for a point-of-care A1c.  Please contact her to see if she can come in for this.  Thanks.

## 2017-11-15 NOTE — Patient Instructions (Signed)
Nice to see you. Please continue to work on diet changes and exercise.  Please see her ophthalmologist. We will check lab work today and contact you with results.

## 2017-11-18 ENCOUNTER — Encounter: Payer: Self-pay | Admitting: *Deleted

## 2017-11-18 NOTE — Telephone Encounter (Signed)
Sent mychart message

## 2017-11-22 NOTE — Telephone Encounter (Signed)
Unread mychart message mailed to patient 

## 2017-11-26 NOTE — Telephone Encounter (Signed)
Pt. Made an appt. For the A1C on next Monday 4/8.  An order will need to be put in.

## 2017-11-29 ENCOUNTER — Other Ambulatory Visit: Payer: Self-pay | Admitting: Family Medicine

## 2017-11-29 DIAGNOSIS — M109 Gout, unspecified: Secondary | ICD-10-CM

## 2017-11-29 MED ORDER — ALLOPURINOL 100 MG PO TABS
100.0000 mg | ORAL_TABLET | Freq: Every day | ORAL | 6 refills | Status: DC
Start: 2017-11-29 — End: 2017-12-25

## 2017-12-02 ENCOUNTER — Other Ambulatory Visit (INDEPENDENT_AMBULATORY_CARE_PROVIDER_SITE_OTHER): Payer: Medicare Other

## 2017-12-02 DIAGNOSIS — E119 Type 2 diabetes mellitus without complications: Secondary | ICD-10-CM | POA: Diagnosis not present

## 2017-12-02 LAB — POCT GLYCOSYLATED HEMOGLOBIN (HGB A1C): HEMOGLOBIN A1C: 6.2

## 2017-12-06 ENCOUNTER — Ambulatory Visit: Payer: Medicare Other | Admitting: Pulmonary Disease

## 2017-12-06 ENCOUNTER — Encounter: Payer: Self-pay | Admitting: Pulmonary Disease

## 2017-12-06 VITALS — BP 112/72 | HR 88 | Ht 65.0 in | Wt 191.0 lb

## 2017-12-06 DIAGNOSIS — R0609 Other forms of dyspnea: Secondary | ICD-10-CM

## 2017-12-06 DIAGNOSIS — G4733 Obstructive sleep apnea (adult) (pediatric): Secondary | ICD-10-CM | POA: Diagnosis not present

## 2017-12-06 DIAGNOSIS — J9611 Chronic respiratory failure with hypoxia: Secondary | ICD-10-CM | POA: Diagnosis not present

## 2017-12-06 DIAGNOSIS — I5032 Chronic diastolic (congestive) heart failure: Secondary | ICD-10-CM | POA: Diagnosis not present

## 2017-12-06 DIAGNOSIS — J984 Other disorders of lung: Secondary | ICD-10-CM

## 2017-12-06 NOTE — Patient Instructions (Signed)
Allergic rhinitis: Use Neil Med rinses with distilled water at least twice per day using the instructions on the package. 1/2 hour after using the [facility] Med rinse, use Nasacort two puffs in each nostril once per day.  Remember that the Nasacort can take 1-2 weeks to work after regular use. Use generic zyrtec (cetirizine) every day.  If this doesn't help, then stop taking it and use chlorpheniramine-phenylephrine combination tablets.  Obstructive sleep apnea: Continue CPAP with oxygen at night  Chronic nocturnal hypoxemia: Keep using oxygen with your CPAP machine in the evenings  Exercise-induced pulmonary hypertension: Continue taking blood pressure medicines as you are doing Exercise regularly Walk test in the office today to monitor oxygen level  We will see you back in 1 year or sooner if needed

## 2017-12-06 NOTE — Progress Notes (Signed)
Subjective:    Patient ID: Betty [name], female    DOB: 02/16/1946, 72 y.o.   MRN: [id number]  Synopsis: Betty [name] is a very pleasant female who first saw the [facility] pulmonary clinic in December 2013 for evaluation of shortness of breath. She is a lifetime nonsmoker. She developed increasing shortness of breath over the course of 2 years about 2011 to 2013. Pulmonary function testing showed restrictive lung disease with a markedly depressed DLCO at 35% predicted. CT scanning of her chest showed upper lobe groundglass abnormalities versus air trapping.  A home polysomnogram showed an AHI of 12 and multiple desaturation events to 80% independent of apneas.  An abg showed resting hypercarbia (pCO2 52), and a MIP/MEP was 36%/28% predicted.  A follow up diaphragm study was normal.  A repeat CT scan was performed, no pulmonary embolism was seen and radiology commented on atelectasis but no clear intersitial lung disease.  Blood work including an HP panel and serologies for connective tissue diseases has been essentially negative (two partial aspergillus bands on HP panel).  Two echocardiograms have not shown evidence of pulmonary hypertension.  After starting on CPAP with nasal pillows and oxygen with exertion and sleep she started feeling better.  A right heart catheterization performed 01/21/2013 did not show pulmonary hypertension.  She had an open lung biopsy at [facility] in November of 2014 which showed small amounts of fibrosis around her pulmonary arteries as well as findings consistent with very mild bronchiolitis. An exercise right heart catheterization test was performed which demonstrated dramatic increases in her left heart pressure with exercise.  HPI Chief Complaint  Patient presents with  . Follow-up    Doing well little cough but doing well.   Betty [name] is doing pretty well.  No problems in the last year.  She has been active.  She is active with  volunteer work at [employer].  She avoids stairs but she doesn't shy away from physical activity.    She sleeps with oxygen with CPAP and hasn't used it during the daytime in the year.  No leg swelling.  This week she has been having more sinus pain and pressure around her right eye, this extends down to her throat.  Sh ehas been taking OTC aleve and decongestants.  No fever, no chills.    Past Medical History:  Diagnosis Date  . Allergic rhinitis    never tested, fall and spring  . Arthritis    hands,knees, feet  . Cataracts, bilateral    not surgical yet  . CHOLECYSTECTOMY, HX OF 10/08/2007   Qualifier: Diagnosis of  By: Council Mechanic MD, [name]   . Chronic diastolic CHF (congestive heart failure) ([place])   . Chronic respiratory failure ([place])   . Diabetes mellitus without complication ([place])   . Diverticulosis    problems with frequent gas, burping  . Glucose intolerance (impaired glucose tolerance)   . Gout   . History of chicken pox   . Hyperlipidemia   . Hypertension   . NICM (nonischemic cardiomyopathy) ([place]) 2002   EF 25%; improved to normal - echo 4/08: EF 50-55%, mild MR, mild LAE, mild TR;    cath 3/03: normal cors, EF 40%  . Obesity   . PONV (postoperative nausea and vomiting)   . Restrictive lung disease   . Skin cancer    skin cancers only  . Sleep apnea    cpap settings 4  Review of Systems  Constitutional: Negative for fever and unexpected weight change.  HENT: Negative for congestion, dental problem, ear pain, nosebleeds, postnasal drip, rhinorrhea, sinus pressure, sneezing, sore throat and trouble swallowing.   Eyes: Negative for redness and itching.  Respiratory: Positive for shortness of breath. Negative for cough, chest tightness and wheezing.   Cardiovascular: Negative for palpitations and leg swelling.  Gastrointestinal: Negative for nausea and vomiting.  Genitourinary: Negative for dysuria.  Musculoskeletal: Negative for joint swelling.    Skin: Negative for rash.  Neurological: Negative for headaches.  Hematological: Does not bruise/bleed easily.  Psychiatric/Behavioral: Negative for dysphoric mood. The patient is not nervous/anxious.        Objective:   Physical Exam Vitals:   12/06/17 1457  BP: 112/72  Pulse: 88  SpO2: 92%  Weight: 191 lb (86.6 kg)  Height: '5\' 5"'$  (1.651 m)   RA  Gen: well appearing HENT: OP clear, TM's clear, neck supple PULM: CTA B, normal percussion CV: RRR, no mgr, trace edema GI: BS+, soft, nontender Derm: no cyanosis or rash Psyche: normal mood and affect     06/2012 Echo>> grade 1 diastolic dysfunction, LVEF 55%, RV/RA normal, triscupid regurge jet velocity WNL 08/2012 PFT [facility] >> Ratio normal, Flow volume loop normal; FVC 1.46 L (48% pred), TLC 2.77 L (55% pred) ERV 0.03 (3% pred), DLCO 8.4 (35% pred) 08/2012 ABG >> 7.39/52/57/31.5/92% 10/01/2012 PSG>> poor sleep efficiency; AHI < 5; desaturated as low as 82% 09/2012 PFT [facility] >> unchanged from 08/2012 study. 10/20/2012 MIP/MEP >> 36 (36% pred)/-20 (28% pred) 09/2012 serology>> ANA neg, Anti-SCL70 neg, SSA/SSB neg/neg, Anti-Jo-1 neg, Aldolase 5, CRP 5, ESR 30, RF 31 10/24/2012 Sniff test >> normal diaphragm movement 09/2012 Home PSG >> AHI 12, several desaturation events independent of apnea 09/2012 Hypersensitivity Pneumonitis profile> Asp flav pos/asp nig partial; all else negative 12/09/2012 6MW >> 950 feet on 2L [place] 12/16/2012 V/Q scan >> multiple areas of matched decreased ventilation and perfusion uptake, more in LUL 01/21/2013 RHC >> RA 9 mmHg, RV 32/8 mmHg, PA 30/16/23 mmHg, PCWP 12 mmHg, Normal wave forms. Oxygen saturations: (done with 2 L [place]), PA 73 %, AO 98%, RV 74%, RA 75%, SVC 75%, IVC 83%; Cardiac Output (Fick) 5.3, Cardiac Index (Fick) 2.7, PVR: 2.1   07/2013 Open lung biopsy [facility]> mild peribronchiolar fibrosis without inflammatory cells, also mild scattered areas of pulmonary arteriopathic change; normal airspace/alveolar  tissue  10/2013 Exercise RHC at [facility] rest> PA 36/20 (mean 26), PCW 15, RA 9, BP 158/77, PA sat 65.5%, CI 2.88, CO 5.7, PVR 1.75 WU; Exercise> PA 78/40 (56), PCW 28, BP 228/83, CI 6.9, CO 13.75, PVR 2 WU; O2 saturation noted to drop to 77% on RA  10/2016 pulmonary function test ratio 85%, FEV1 1.49 L 63% predicted, FVC 1.76 L 56% predicted, total lung capacity 3.19 L 61% predicted, DLCO 16.09 mL per minute per millimeters of mercury, 62% predicted  Records from her visit with primary care reviewed where she was seen for gout, chronic respiratory failure with hypoxemia diabetes, and chronic diastolic heart failure     Assessment & Plan:   Dyspnea on exertion  Restrictive lung disease  Chronic hypoxemic respiratory failure (HCC)  Obstructive sleep apnea  Chronic respiratory failure with hypoxia (HCC)  Chronic diastolic heart failure (HCC)  Discussion: Apart from some mild allergic rhinitis symptoms lately this has been a stable year for [place].  She is compliant with CPAP.  She uses oxygen in the evenings.  She has not  been limited by breathing lately.  Plan: Allergic rhinitis: Use Neil Med rinses with distilled water at least twice per day using the instructions on the package. 1/2 hour after using the [facility] Med rinse, use Nasacort two puffs in each nostril once per day.  Remember that the Nasacort can take 1-2 weeks to work after regular use. Use generic zyrtec (cetirizine) every day.  If this doesn't help, then stop taking it and use chlorpheniramine-phenylephrine combination tablets.  Obstructive sleep apnea: Continue CPAP with oxygen at night We will order a download  Chronic nocturnal hypoxemia: Keep using oxygen with your CPAP machine in the evenings  Exercise-induced pulmonary hypertension: Continue taking blood pressure medicines as you are doing Exercise regularly Walk test in the office today to monitor oxygen level  We will see you back in 1 year or sooner if  needed    Current Outpatient Medications:  .  allopurinol (ZYLOPRIM) 100 MG tablet, Take 1 tablet (100 mg total) by mouth daily., Disp: 30 tablet, Rfl: 6 .  aspirin 81 MG EC tablet, Take 1 tablet (81 mg total) by mouth daily., Disp: 90 tablet, Rfl: 2 .  carvedilol (COREG) 12.5 MG tablet, TAKE 1 TABLET BY MOUTH TWICE A DAY WITH A MEAL, Disp: 180 tablet, Rfl: 2 .  Coenzyme Q10 (CO Q 10) 100 MG CAPS, Take 100 mg by mouth at bedtime. , Disp: , Rfl:  .  Fish Oil OIL, Take 227 mcg by mouth 2 (two) times daily. GUMMIES, Disp: , Rfl:  .  furosemide (LASIX) 40 MG tablet, TAKE 1 TAB ONCE DAILY. TAKE EXTRA TAB ONCE DAILY AS NEEDED FOR WEIGHT GAIN OR SWELLING, Disp: 180 tablet, Rfl: 2 .  losartan (COZAAR) 50 MG tablet, Take 0.5 tablets (25 mg total) by mouth daily., Disp: 45 tablet, Rfl: 3 .  multivitamin (THERAGRAN) per tablet, Take 1 tablet by mouth daily. , Disp: , Rfl:  .  OVER THE COUNTER MEDICATION, Take 2 capsules by mouth 2 (two) times daily., Disp: , Rfl:  .  polyvinyl alcohol (LIQUIFILM TEARS) 1.4 % ophthalmic solution, Place 1 drop into both eyes every morning., Disp: , Rfl:  .  potassium chloride SA (KLOR-CON M20) 20 MEQ tablet, Take 2 tablets (40 mEq total) by mouth every morning AND 1 tablet (20 mEq total) every evening., Disp: 90 tablet, Rfl: 6 .  simvastatin (ZOCOR) 20 MG tablet, Take 1 tablet (20 mg total) by mouth at bedtime., Disp: 90 tablet, Rfl: 3

## 2017-12-16 ENCOUNTER — Other Ambulatory Visit (INDEPENDENT_AMBULATORY_CARE_PROVIDER_SITE_OTHER): Payer: Medicare Other

## 2017-12-16 DIAGNOSIS — M109 Gout, unspecified: Secondary | ICD-10-CM | POA: Diagnosis not present

## 2017-12-16 LAB — URIC ACID: Uric Acid, Serum: 6.4 mg/dL (ref 2.4–7.0)

## 2017-12-20 ENCOUNTER — Encounter (HOSPITAL_COMMUNITY): Payer: Medicare Other | Admitting: Internal Medicine

## 2017-12-23 ENCOUNTER — Other Ambulatory Visit: Payer: Self-pay | Admitting: Family Medicine

## 2017-12-23 DIAGNOSIS — Z1231 Encounter for screening mammogram for malignant neoplasm of breast: Secondary | ICD-10-CM

## 2017-12-25 ENCOUNTER — Ambulatory Visit (HOSPITAL_COMMUNITY)
Admission: RE | Admit: 2017-12-25 | Discharge: 2017-12-25 | Disposition: A | Discharge status: Home / Self Care | Payer: Medicare Other | Source: Ambulatory Visit | Attending: Internal Medicine | Admitting: Internal Medicine

## 2017-12-25 ENCOUNTER — Telehealth (HOSPITAL_COMMUNITY): Payer: Self-pay | Admitting: Internal Medicine

## 2017-12-25 ENCOUNTER — Encounter (HOSPITAL_COMMUNITY): Payer: Self-pay | Admitting: Internal Medicine

## 2017-12-25 VITALS — BP 141/65 | HR 75 | Wt 190.1 lb

## 2017-12-25 DIAGNOSIS — I429 Cardiomyopathy, unspecified: Secondary | ICD-10-CM | POA: Insufficient documentation

## 2017-12-25 DIAGNOSIS — E785 Hyperlipidemia, unspecified: Secondary | ICD-10-CM | POA: Insufficient documentation

## 2017-12-25 DIAGNOSIS — J984 Other disorders of lung: Secondary | ICD-10-CM | POA: Diagnosis not present

## 2017-12-25 DIAGNOSIS — Z7982 Long term (current) use of aspirin: Secondary | ICD-10-CM | POA: Insufficient documentation

## 2017-12-25 DIAGNOSIS — M109 Gout, unspecified: Secondary | ICD-10-CM | POA: Insufficient documentation

## 2017-12-25 DIAGNOSIS — R0609 Other forms of dyspnea: Secondary | ICD-10-CM

## 2017-12-25 DIAGNOSIS — Z882 Allergy status to sulfonamides status: Secondary | ICD-10-CM | POA: Diagnosis not present

## 2017-12-25 DIAGNOSIS — Z880 Allergy status to penicillin: Secondary | ICD-10-CM | POA: Insufficient documentation

## 2017-12-25 DIAGNOSIS — Z9981 Dependence on supplemental oxygen: Secondary | ICD-10-CM | POA: Insufficient documentation

## 2017-12-25 DIAGNOSIS — I5032 Chronic diastolic (congestive) heart failure: Secondary | ICD-10-CM | POA: Diagnosis present

## 2017-12-25 DIAGNOSIS — R0600 Dyspnea, unspecified: Secondary | ICD-10-CM | POA: Insufficient documentation

## 2017-12-25 DIAGNOSIS — E119 Type 2 diabetes mellitus without complications: Secondary | ICD-10-CM | POA: Diagnosis not present

## 2017-12-25 DIAGNOSIS — G4733 Obstructive sleep apnea (adult) (pediatric): Secondary | ICD-10-CM | POA: Insufficient documentation

## 2017-12-25 DIAGNOSIS — I110 Hypertensive heart disease with heart failure: Secondary | ICD-10-CM | POA: Insufficient documentation

## 2017-12-25 DIAGNOSIS — Z888 Allergy status to other drugs, medicaments and biological substances status: Secondary | ICD-10-CM | POA: Diagnosis not present

## 2017-12-25 DIAGNOSIS — Z79899 Other long term (current) drug therapy: Secondary | ICD-10-CM | POA: Diagnosis not present

## 2017-12-25 NOTE — Progress Notes (Signed)
Patient ID: Betty [name], female   DOB: Feb 26, 1946, 72 y.o.   MRN: [id number]    Advanced Heart Failure Clinic Note   Date:  12/25/2017   ID:  Betty [name], Betty [name] 1946-01-09, MRN [id number]  PCP:  Leone Haven, MD  Cardiologist:  Dr. Dorris Carnes      History of Present Illness: Betty [name] is a 72 y.o. female with a hx of NICM with previous EF 25% (improved to normal), HL, gout, glucose intol, normal cors by [facility] in 2003, OSA. Referred by Dr. Harrington Challenger for further evaluation of exertional dyspnea.   She developed increasing shortness of breath over the course of 2 years about 2011 to 2013. She had an extensive Pulmonary work-up at [facility] and has been seen by Dr. Lake Bells. Pulmonary function testing showed restrictive lung disease with a markedly depressed DLCO at 35% predicted. CT scanning of her chest showed upper lobe groundglass abnormalities versus air trapping. A home polysomnogram showed an AHI of 12 and multiple desaturation events to 80% independent of apneas. An abg showed resting hypercarbia (pCO2 52), and a MIP/MEP was 36%/28% predicted. A follow up diaphragm study was normal. A repeat CT scan was performed, no pulmonary embolism was seen and radiology commented on atelectasis but no clear intersitial lung disease. Blood work including an HP panel and serologies for connective tissue diseases has been essentially negative (two partial aspergillus bands on HP panel). Two echocardiograms have not shown evidence of pulmonary hypertension. After starting on CPAP with nasal pillows and oxygen with exertion and sleep she started feeling better.  She had an open lung biopsy at [facility] in November of 2014 which showed small amounts of fibrosis around her pulmonary arteries as well as findings consistent with very mild bronchiolitis.  Resting RHC was normal 12/2012 Referred to [facility] (Dr. Gilles Chiquito).  RHC with exercise in 3/15 showed marked increase in pulmonary pressures and  PCWP with exercise with no change in PVR. O2 sats down to 77% with exercise. Corrected with O2. PA rest 36/20 (26)  PCWP 15 with exercise PA 78/40 (56) PCWP 28  This was felt to reflect diastolic dysfunction and intrinsic lung disease.  Lung bx was not suggestive of clear lung pathology.  CT was neg for PE.  Continued management of diastolic CHF has been recommended.      Saw Dr. Lake Bells last year, PFTs had improved.  She presents today for regular follow up. Last seen 1/19. Continues to feel better. Able to do all ADLs without any problem. Able to go shopping and work a bit in the yard without problem. No orthopnea, PND or edema. Weight stable. Taking lasix 40mg  daily. Never has to take extra. Continue to go to maintenance class. SBP 120-130. Wears O2 at night but doesn't need it during exercise   Echo 08/06/16 LVEF 55-60%, Grade 1 DD, Mild TR, PA pressures WNL  Studies:  - RHC (5/14):  RA 9, RV 32/8, PA 30/16/23, PCWP 12, 2.1 WU, CO 5.3, CI 2.7  - Echo (3/14):  EF 55-60%, no RWMA, Gr 1 DD, mild MR  - Nuclear (11/13):  Breast atten, no ischemia, EF 55%; Low Risk   - PFTs. [place] 2014: FVC 1.46 L (48% pred), TLC 2.77 L (55% pred) ERV 0.03 (3% pred), DLCO 8.4 (35% pred)  - PFTs FVC 1.74 (55%), TLC 3.19 (61%), DLCO 15.89 (62%)  Recent Labs/Images: 04/30/2017: ALT 17; HDL 62.50; LDL Cholesterol 58 09/19/2017: B Natriuretic Peptide 30.7; Creatinine, Ser  0.61; Hemoglobin 13.1; Potassium 4.3   Wt Readings from Last 3 Encounters:  12/25/17 190 lb 1.9 oz (86.2 kg)  12/06/17 191 lb (86.6 kg)  11/15/17 189 lb (85.7 kg)     Past Medical History:  Diagnosis Date  . Allergic rhinitis    never tested, fall and spring  . Arthritis    hands,knees, feet  . Cataracts, bilateral    not surgical yet  . CHOLECYSTECTOMY, HX OF 10/08/2007   Qualifier: Diagnosis of  By: Council Mechanic MD, [name]   . Chronic diastolic CHF (congestive heart failure) ([place])   . Chronic respiratory failure ([place])   . Diabetes  mellitus without complication ([place])   . Diverticulosis    problems with frequent gas, burping  . Glucose intolerance (impaired glucose tolerance)   . Gout   . History of chicken pox   . Hyperlipidemia   . Hypertension   . NICM (nonischemic cardiomyopathy) ([place]) 2002   EF 25%; improved to normal - echo 4/08: EF 50-55%, mild MR, mild LAE, mild TR;    cath 3/03: normal cors, EF 40%  . Obesity   . PONV (postoperative nausea and vomiting)   . Restrictive lung disease   . Skin cancer    skin cancers only  . Sleep apnea    cpap settings 4    Current Outpatient Medications  Medication Sig Dispense Refill  . aspirin 81 MG EC tablet Take 1 tablet (81 mg total) by mouth daily. 90 tablet 2  . Calcium Carbonate (CALTRATE 600 PO) Take by mouth every morning.    . carvedilol (COREG) 12.5 MG tablet TAKE 1 TABLET BY MOUTH TWICE A DAY WITH A MEAL 180 tablet 2  . Coenzyme Q10 (CO Q 10) 100 MG CAPS Take 100 mg by mouth at bedtime.     . Fish Oil OIL Take 227 mcg by mouth 2 (two) times daily. GUMMIES    . furosemide (LASIX) 40 MG tablet TAKE 1 TAB ONCE DAILY. TAKE EXTRA TAB ONCE DAILY AS NEEDED FOR WEIGHT GAIN OR SWELLING 180 tablet 2  . losartan (COZAAR) 50 MG tablet Take 0.5 tablets (25 mg total) by mouth daily. 45 tablet 3  . multivitamin (THERAGRAN) per tablet Take 1 tablet by mouth daily.     . polyvinyl alcohol (LIQUIFILM TEARS) 1.4 % ophthalmic solution Place 1 drop into both eyes every morning.    . potassium chloride SA (KLOR-CON M20) 20 MEQ tablet Take 2 tablets (40 mEq total) by mouth every morning AND 1 tablet (20 mEq total) every evening. 90 tablet 6  . simvastatin (ZOCOR) 20 MG tablet Take 1 tablet (20 mg total) by mouth at bedtime. 90 tablet 3   No current facility-administered medications for this encounter.    Allergies:   Amoxicillin; Etodolac; Hctz [hydrochlorothiazide]; Meloxicam; Metformin and related; and Sulfonamide derivatives   Social History:  The patient  reports that she  has never smoked. She has never used smokeless tobacco. She reports that she does not drink alcohol or use drugs.   Family History:  The patient's family history includes COPD in her father; Cancer in her maternal grandfather and maternal grandmother; Cancer (age of onset: 29) in her mother; Depression in her sister; Diabetes in her father, paternal grandfather, and sister; Heart attack in her father; Heart disease in her father and paternal grandfather; Hyperlipidemia in her father; Hypertension in her brother and father; Lymphoma in her mother; Meniere's disease in her brother; Pneumonia in her father; Schizophrenia in her  daughter; Stroke in her father.   Review of systems complete and found to be negative unless listed in HPI.    PHYSICAL EXAM: Vitals:   12/25/17 0944  BP: (!) 141/65  Pulse: 75  SpO2: 94%  Weight: 190 lb 1.9 oz (86.2 kg)   Wt Readings from Last 3 Encounters:  12/25/17 190 lb 1.9 oz (86.2 kg)  12/06/17 191 lb (86.6 kg)  11/15/17 189 lb (85.7 kg)    General:  Well appearing. No resp difficulty HEENT: normal Neck: supple. no JVD. Carotids 2+ bilat; no bruits. No lymphadenopathy or thryomegaly appreciated. Cor: PMI nondisplaced. Regular rate & rhythm. No rubs, gallops or murmurs. Lungs: clear Abdomen: soft, nontender, nondistended. No hepatosplenomegaly. No bruits or masses. Good bowel sounds. Extremities: no cyanosis, clubbing, rash, edema Neuro: alert & orientedx3, cranial nerves grossly intact. moves all 4 extremities w/o difficulty. Affect pleasant   ASSESSMENT AND PLAN:  1. Dyspnea - Much improved with rehab and diuresis. Volume status looks good. NYHA II 2. Chronic hypoxic respiratory failure on home O2 and CPAP  - Improved with rehab. Sats stay > 90% with exercise. Continue nocturnal O2 support 3. Chronic diastolic heart failure   - Echo 07/2016 LVEF 55-60% - Volume status stable on exam.  - Continue lasix 40 mg as needed. - Will repeat echo   4.  Restrictive lung physiology - PFTs 10/2016 had improved. 5. HTN  - Blood pressure well controlled. Continue current regimen.  She is doing very well. Continue Maintenance exercise program. Will repeat echo. If stable, can graduate from HF Program and f/u with Dr. Ross/[facility]. We would be happy to see her back as needed.    Glori Bickers, MD  12/25/2017

## 2017-12-25 NOTE — Patient Instructions (Signed)
Your physician has requested that you have an echocardiogram. Echocardiography is a painless test that uses sound waves to create images of your heart. It provides your doctor with information about the size and shape of your heart and how well your heart's chambers and valves are working. This procedure takes approximately one hour. There are no restrictions for this procedure.  SOON AT THE [facility] OFFICE, THEY WILL CALL YOU TO SCHEDULE  You have been referred back to Dr Harrington Challenger, her office will call you to schedule in appointment in 6 months

## 2017-12-30 NOTE — Telephone Encounter (Signed)
User: Cherie Dark A Date/time: 12/27/17 11:10 AM  Comment: Called pt and lmsg for her to CB to get scheduled for an echo.Vassie Moment  Context:  Outcome: Left Message  Phone number: [phone] Phone Type: Home Phone  Comm. type: Telephone Call type: Outgoing  Contact: Brendia Sacks Relation to patient: Self    User: Cherie Dark A Date/time: 12/26/17 2:37 PM  Comment: Called pt and lmsg for her to CB to get sch for an echo  Context:  Outcome: Left Message  Phone number: [phone] Phone Type: Mobile  Comm. type: Telephone Call type: Outgoing  Contact: Brendia Sacks Relation to patient: Self    User: Cherie Dark A Date/time: 12/25/17 11:26 AM  Comment: Called pt and lmsg for her to CB to get sch for an echo.Vassie Moment  Context:  Outcome: Left Message  Phone number: [phone] Phone Type: Mobile  Comm. type: Telephone Call type: Outgoing  Contact: Brendia Sacks Relation to patient: Self

## 2017-12-31 ENCOUNTER — Ambulatory Visit (HOSPITAL_COMMUNITY): Payer: Medicare Other | Attending: Cardiovascular Disease

## 2017-12-31 ENCOUNTER — Other Ambulatory Visit: Payer: Self-pay

## 2017-12-31 DIAGNOSIS — I5032 Chronic diastolic (congestive) heart failure: Secondary | ICD-10-CM | POA: Insufficient documentation

## 2017-12-31 DIAGNOSIS — I429 Cardiomyopathy, unspecified: Secondary | ICD-10-CM | POA: Insufficient documentation

## 2017-12-31 DIAGNOSIS — E119 Type 2 diabetes mellitus without complications: Secondary | ICD-10-CM | POA: Insufficient documentation

## 2017-12-31 DIAGNOSIS — E785 Hyperlipidemia, unspecified: Secondary | ICD-10-CM | POA: Diagnosis not present

## 2017-12-31 DIAGNOSIS — I509 Heart failure, unspecified: Secondary | ICD-10-CM | POA: Diagnosis not present

## 2017-12-31 DIAGNOSIS — R0600 Dyspnea, unspecified: Secondary | ICD-10-CM | POA: Insufficient documentation

## 2017-12-31 DIAGNOSIS — I10 Essential (primary) hypertension: Secondary | ICD-10-CM | POA: Insufficient documentation

## 2018-01-24 ENCOUNTER — Telehealth (HOSPITAL_COMMUNITY): Payer: Self-pay | Admitting: *Deleted

## 2018-01-24 NOTE — Telephone Encounter (Signed)
Pt given echo results 

## 2018-02-10 ENCOUNTER — Ambulatory Visit
Admission: RE | Admit: 2018-02-10 | Discharge: 2018-02-10 | Disposition: A | Discharge status: Home / Self Care | Payer: Medicare Other | Source: Ambulatory Visit | Attending: Family Medicine | Admitting: Family Medicine

## 2018-02-10 DIAGNOSIS — Z1231 Encounter for screening mammogram for malignant neoplasm of breast: Secondary | ICD-10-CM | POA: Insufficient documentation

## 2018-03-17 ENCOUNTER — Ambulatory Visit: Payer: Self-pay

## 2018-03-17 ENCOUNTER — Ambulatory Visit: Payer: Medicare Other | Admitting: Podiatry

## 2018-03-17 ENCOUNTER — Encounter: Payer: Self-pay | Admitting: Podiatry

## 2018-03-17 ENCOUNTER — Other Ambulatory Visit: Payer: Self-pay | Admitting: Podiatry

## 2018-03-17 VITALS — BP 118/70 | HR 77

## 2018-03-17 DIAGNOSIS — M2011 Hallux valgus (acquired), right foot: Secondary | ICD-10-CM

## 2018-03-17 DIAGNOSIS — S93602A Unspecified sprain of left foot, initial encounter: Secondary | ICD-10-CM

## 2018-03-17 DIAGNOSIS — E1142 Type 2 diabetes mellitus with diabetic polyneuropathy: Secondary | ICD-10-CM

## 2018-03-17 DIAGNOSIS — M7730 Calcaneal spur, unspecified foot: Secondary | ICD-10-CM | POA: Diagnosis not present

## 2018-03-17 DIAGNOSIS — R52 Pain, unspecified: Secondary | ICD-10-CM

## 2018-03-17 DIAGNOSIS — T148XXA Other injury of unspecified body region, initial encounter: Secondary | ICD-10-CM

## 2018-03-17 DIAGNOSIS — M2012 Hallux valgus (acquired), left foot: Secondary | ICD-10-CM | POA: Diagnosis not present

## 2018-03-17 NOTE — Progress Notes (Addendum)
This patient presents to the  office for evaluation and treatment of her diabetic feet.  She presents to  the office today stating that she has had a gout attack on the top of her left foot. She says that the gout attack has been developing and became severe about a week ago.  She says that her left foot has started to improve the last few days. He says that she had no x-ray or treatment for this gout attack She says she took limited amounts of colchicine.  She also presents to the office stating that she is a diabetic and desires diabetic inserts to be put in her newly purchased shoes. She presents the office today for an evaluation and treatment of her diabetic feet.  General Appearance  Alert, conversant and in no acute stress.  Vascular  Dorsalis pedis and posterior tibial  pulses are palpable  bilaterally.  Capillary return is within normal limits  bilaterally. Temperature is within normal limits  bilaterally.  Neurologic  Senn-Weinstein monofilament wire test absent   bilaterally. Muscle power within normal limits bilaterally.  Nails Thick disfigured discolored nails with subungual debris  from hallux to fifth toes bilaterally. No evidence of bacterial infection or drainage bilaterally.  Orthopedic  No limitations of motion of motion feet .  No crepitus or effusions noted.  Retrocalcaneal spur  B/L.  HAV  B/L. Increased temperature noted with palpable pain noted to the third and fourth metatarsals, left foot.  Skin  normotropic skin with no porokeratosis noted bilaterally.  No signs of infections or ulcers noted.    Foot Sprain left foot.  Diabetes with neuropathy.  HAV  B/L  IE.  Xrays were taken revealing cloudiness lateral to fourth metatarsal left foot.  Xray also reveals circular bone lesion cunieform bone left foot. I discussed this condition with this patient.  I told this patient. I did not believe she experienced a gout attack.  I told her that I believe that there was something else  causing the swelling, pain and increased temperature in her left foot.  X-rays were taken following our discussion.  No evidence of a stress fracture was noted.  I recommended that she receive a compression sock and a surgical shoe.  She should return to the office in 3 weeks.  She says as she was leaving the office. She felt much better with the sock and shoe.  This patient was to make an appointment with Liliane Channel tfor diabetic shoes.. She does qualify for shoes due to DPN  HAV and spurs  B/l   Gardiner Barefoot DPM

## 2018-03-18 ENCOUNTER — Encounter: Payer: Self-pay | Admitting: Podiatry

## 2018-03-19 ENCOUNTER — Other Ambulatory Visit: Payer: Medicare Other | Admitting: Orthotics

## 2018-03-31 ENCOUNTER — Ambulatory Visit: Payer: Medicare Other

## 2018-03-31 ENCOUNTER — Ambulatory Visit: Payer: Medicare Other | Admitting: Podiatry

## 2018-03-31 ENCOUNTER — Encounter: Payer: Self-pay | Admitting: Podiatry

## 2018-03-31 DIAGNOSIS — E1142 Type 2 diabetes mellitus with diabetic polyneuropathy: Secondary | ICD-10-CM

## 2018-03-31 DIAGNOSIS — S93602A Unspecified sprain of left foot, initial encounter: Secondary | ICD-10-CM

## 2018-03-31 NOTE — Progress Notes (Signed)
This patient presents to the office follow-up for a sprained left foot.  She was diagnosed with a sprained left foot and treated with a compression sock and surgical shoe.  She says that her left foot is much better and she is not experiencing any pain or discomfort.  She says she also was evaluated by Liliane Channel since she qualifies for diabetic footgear.  She says she was unable to medication. Due to her heel spur on the back of her foot.  She presents the office today for an evaluation and treatment of her left foot.  General Appearance  Alert, conversant and in no acute stress.  Vascular  Dorsalis pedis and posterior tibial  pulses are palpable  bilaterally.  Capillary return is within normal limits  bilaterally. Temperature is within normal limits  bilaterally.  Neurologic  Senn-Weinstein monofilament wire test absent   bilaterally. Muscle power within normal limits bilaterally.  Nails Thick disfigured discolored nails with subungual debris  from hallux to fifth toes bilaterally. No evidence of bacterial infection or drainage bilaterally.  Orthopedic  No limitations of motion of motion feet .  No crepitus or effusions noted.  Retrocalcaneal spur bilateral.  hAV bilaterally.  Her inflamed, red left foot has resolved and she only has swelling, which is the same amount on her right foot.  Skin  normotropic skin with no porokeratosis noted bilaterally.  No signs of infections or ulcers noted.   Foot Sprain left foot.   ROV.  Evaluated her left foot and hurts inflammation in her left foot has resolved.  No palpable pain was noted left foot.  Patient was told to continue using the compression sock with her regular footgear.   Gardiner Barefoot DPM

## 2018-04-08 ENCOUNTER — Ambulatory Visit (INDEPENDENT_AMBULATORY_CARE_PROVIDER_SITE_OTHER): Payer: Medicare Other

## 2018-04-08 VITALS — BP 118/62 | HR 86 | Temp 98.3°F | Resp 14 | Ht 65.0 in | Wt 199.8 lb

## 2018-04-08 DIAGNOSIS — Z0000 Encounter for general adult medical examination without abnormal findings: Secondary | ICD-10-CM

## 2018-04-08 NOTE — Progress Notes (Signed)
Subjective:   Betty [name] is a 72 y.o. female who presents for Medicare Annual (Subsequent) preventive examination.  Review of Systems:  No ROS.  Medicare Wellness Visit. Additional risk factors are reflected in the social history.  Cardiac Risk Factors include: advanced age (>12men, >51 women);diabetes mellitus;obesity (BMI >30kg/m2)     Objective:     Vitals: BP 118/62 (BP Location: Left Arm, Patient Position: Sitting, Cuff Size: Normal)   Pulse 86   Temp 98.3 F (36.8 C) (Oral)   Resp 14   Ht 5\' 5"  (1.651 m)   Wt 199 lb 12.8 oz (90.6 kg)   SpO2 93%   BMI 33.25 kg/m   Body mass index is 33.25 kg/m.  Advanced Directives 04/08/2018 03/28/2017 09/25/2016 09/20/2016 05/28/2016 03/28/2016 12/20/2014  Does Patient Have a Medical Advance Directive? No No No No No No No  Would patient like information on creating a medical advance directive? No - Patient declined No - Patient declined - - - Yes - Scientist, clinical (histocompatibility and immunogenetics) given No - patient declined information    Tobacco Social History   Tobacco Use  Smoking Status Never Smoker  Smokeless Tobacco Never Used     Counseling given: Not Answered   Clinical Intake:  Pre-visit preparation completed: Yes  Pain : No/denies pain     Nutritional Status: BMI > 30  Obese Diabetes: Yes(Followed by pcp)  How often do you need to have someone help you when you read instructions, pamphlets, or other written materials from your doctor or pharmacy?: 1 - Never  Interpreter Needed?: No     Past Medical History:  Diagnosis Date  . Allergic rhinitis    never tested, fall and spring  . Arthritis    hands,knees, feet  . Cataracts, bilateral    not surgical yet  . CHOLECYSTECTOMY, HX OF 10/08/2007   Qualifier: Diagnosis of  By: Council Mechanic MD, [name]   . Chronic diastolic CHF (congestive heart failure) ([place])   . Chronic respiratory failure ([place])   . Diabetes mellitus without complication ([place])   . Diverticulosis    problems with  frequent gas, burping  . Glucose intolerance (impaired glucose tolerance)   . Gout   . History of chicken pox   . Hyperlipidemia   . Hypertension   . NICM (nonischemic cardiomyopathy) ([place]) 2002   EF 25%; improved to normal - echo 4/08: EF 50-55%, mild MR, mild LAE, mild TR;    cath 3/03: normal cors, EF 40%  . Obesity   . PONV (postoperative nausea and vomiting)   . Restrictive lung disease   . Skin cancer    skin cancers only  . Sleep apnea    cpap settings 4   Past Surgical History:  Procedure Laterality Date  . BILATERAL VATS ABLATION Right    biopsy done 2015- [facility]  . BREAST EXCISIONAL BIOPSY Left 80's   NEG  . CARDIAC CATHETERIZATION    . choleystectomy  02/2002  . COLONOSCOPY WITH PROPOFOL N/A 12/20/2014   Procedure: COLONOSCOPY WITH PROPOFOL;  Surgeon: [name], MD;  Location: [facility];  Service: Gastroenterology;  Laterality: N/A;  . CYSTECTOMY  1996   l breast  . DILATION AND CURETTAGE OF UTERUS  2011   Dr[facility] Hulan Fray at [facility]  . Drain    . nsvd     x 2  . TUBAL LIGATION  1975  . VAGINAL DELIVERY     x2   Family History  Problem Relation Age of  Onset  . Lymphoma Mother   . Cancer Mother 32       Lymphoma  . COPD Father        was a smoker  . Stroke Father   . Pneumonia Father   . Diabetes Father   . Hyperlipidemia Father   . Heart disease Father   . Heart attack Father   . Hypertension Father   . Diabetes Sister   . Depression Sister   . Meniere's disease Brother   . Diabetes Paternal Grandfather   . Heart disease Paternal Grandfather   . Schizophrenia Daughter   . Cancer Maternal Grandmother        Bladder  . Cancer Maternal Grandfather        leukemia  . Hypertension Brother   . Colon cancer Neg Hx   . Stomach cancer Neg Hx   . Breast cancer Neg Hx    Social History   Socioeconomic History  . Marital status: Married    Spouse name: Not on file  . Number of children: 2  . Years of education: Not on file  .  Highest education level: Not on file  Occupational History  . Occupation: Retired Product manager: retired  . Occupation: Works at [employer]  . Financial resource strain: Not hard at all  . Food insecurity:    Worry: Never true    Inability: Never true  . Transportation needs:    Medical: No    Non-medical: No  Tobacco Use  . Smoking status: Never Smoker  . Smokeless tobacco: Never Used  Substance and Sexual Activity  . Alcohol use: No    Alcohol/week: 0.0 standard drinks  . Drug use: No  . Sexual activity: Never  Lifestyle  . Physical activity:    Days per week: 0 days    Minutes per session: 0 min  . Stress: Not on file  Relationships  . Social connections:    Talks on phone: Not on file    Gets together: Not on file    Attends religious service: Not on file    Active member of club or organization: Not on file    Attends meetings of clubs or organizations: Not on file    Relationship status: Not on file  Other Topics Concern  . Not on file  Social History Narrative   Lives in [place] with husband. 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 No pets.    Outpatient Encounter Medications as of 04/08/2018  Medication Sig  . aspirin 81 MG EC tablet Take 1 tablet (81 mg total) by mouth daily.  . Calcium Carbonate (CALTRATE 600 PO) Take by mouth every morning.  . carvedilol (COREG) 12.5 MG tablet TAKE 1 TABLET BY MOUTH TWICE A DAY WITH A MEAL  . Coenzyme Q10 (CO Q 10) 100 MG CAPS Take 100 mg by mouth at bedtime.   . Fish Oil OIL Take 227 mcg by mouth 2 (two) times daily. GUMMIES  . furosemide (LASIX) 40 MG tablet TAKE 1 TAB ONCE DAILY. TAKE EXTRA TAB ONCE DAILY AS NEEDED FOR WEIGHT GAIN OR SWELLING  . losartan (COZAAR) 50 MG tablet Take 0.5 tablets (25 mg total) by mouth daily.  . multivitamin (THERAGRAN) per tablet Take 1 tablet by mouth daily.   . polyvinyl alcohol (LIQUIFILM TEARS) 1.4 % ophthalmic solution Place 1 drop into both eyes  every morning.  . potassium chloride SA (KLOR-CON M20) 20 MEQ tablet Take 2  tablets (40 mEq total) by mouth every morning AND 1 tablet (20 mEq total) every evening.  . simvastatin (ZOCOR) 20 MG tablet Take 1 tablet (20 mg total) by mouth at bedtime.   No facility-administered encounter medications on file as of 04/08/2018.     Activities of Daily Living In your present state of health, do you have any difficulty performing the following activities: 04/08/2018  Hearing? N  Vision? N  Difficulty concentrating or making decisions? N  Walking or climbing stairs? N  Dressing or bathing? N  Doing errands, shopping? N  Preparing Food and eating ? N  Using the Toilet? N  In the past six months, have you accidently leaked urine? N  Do you have problems with loss of bowel control? N  Managing your Medications? N  Managing your Finances? N  Housekeeping or managing your Housekeeping? N  Some recent data might be hidden    Patient Care Team: Leone Haven, MD as PCP - General (Family Medicine) Dasher, Rayvon Char, MD (Dermatology) Juanito Doom, MD as Referring Physician (Internal Medicine)    Assessment:   This is a routine wellness examination for [place].  The goal of the wellness visit is to assist the patient how to close the gaps in care and create a preventative care plan for the patient.   The roster of all physicians providing medical care to patient is listed in the Snapshot section of the chart.  Osteoporosis risk reviewed.    Safety issues reviewed; Smoke and carbon monoxide detectors in the home. No firearms in the home. Wears seatbelts when driving or riding with others. No violence in the home.  They do not have excessive sun exposure.  Discussed the need for sun protection: hats, long sleeves and the use of sunscreen if there is significant sun exposure.  Patient is alert, normal appearance, oriented to person/place/and time.  Correctly identified the president  of the [place] and recalls of 3/3 words. Performs simple calculations and can read correct time from watch face.  Displays appropriate judgement.  No new identified risk were noted.  No failures at ADL's or IADL's.    BMI- discussed the importance of a healthy diet, water intake and the benefits of aerobic exercise. Educational material provided.   24 hour diet recall: Low carb diet  Fluid restriction- followed by Cardiologist.   Dental- every 6 months.  Sleep patterns- Sleeps 8 hours at night.  Wakes feeling rested. CPAP with oxygen in use set at 3L.  Eye exam- scheduled with [facility] (Dr. George Ina) next month.   Diabetes- followed by pcp; diet controlled.   Patient Concerns: None at this time. Follow up with PCP as needed.  Exercise Activities and Dietary recommendations Current Exercise Habits: The patient does not participate in regular exercise at present  Goals    . Increase physical activity     Recumbent bike 3-5 days weekly, 30 minutes Weights for strength         Fall Risk Fall Risk  04/08/2018 03/28/2017 11/13/2016 03/28/2016 01/27/2016  Falls in the past year? No No No No No   Depression Screen PHQ 2/9 Scores 04/08/2018 03/28/2017 11/13/2016 03/28/2016  PHQ - 2 Score 0 0 0 0  PHQ- 9 Score - 0 2 -     Cognitive Function MMSE - Mini Mental State Exam 04/08/2018 03/28/2017 03/28/2016  Orientation to time 5 5 5   Orientation to Place 5 5 5   Registration 3 3 3   Attention/ Calculation  5 5 5   Recall 3 3 3   Language- name 2 objects 2 2 2   Language- repeat 1 1 1   Language- follow 3 step command 3 3 3   Language- read & follow direction 1 1 1   Write a sentence 1 1 1   Copy design 1 1 1   Total score 30 30 30         Immunization History  Administered Date(s) Administered  . H1N1 09/08/2008  . Influenza Split 06/28/2011, 05/01/2012  . Influenza Whole 07/11/2007, 06/07/2008, 07/12/2009  . Influenza,inj,Quad PF,6+ Mos 04/23/2014, 04/21/2015  .  Influenza-Unspecified 04/27/2013, 05/04/2016, 04/24/2017  . Pneumococcal Conjugate-13 01/21/2014  . Pneumococcal Polysaccharide-23 08/28/2011, 03/06/2012  . Td 10/03/2007, 10/14/2013  . Tdap 10/11/2013  . Zoster 06/21/2010   Screening Tests Health Maintenance  Topic Date Due  . OPHTHALMOLOGY EXAM  05/04/2017  . INFLUENZA VACCINE  03/27/2018  . HEMOGLOBIN A1C  06/03/2018  . FOOT EXAM  11/16/2018  . COLONOSCOPY  12/20/2019  . MAMMOGRAM  02/11/2020  . TETANUS/TDAP  10/15/2023  . DEXA SCAN  Completed  . Hepatitis C Screening  Completed  . PNA vac Low Risk Adult  Completed      Plan:    End of life planning; Advance aging; Advanced directives discussed. Copy of current HCPOA/Living Will requested once completed.    I have personally reviewed and noted the following in the patient's chart:   . Medical and social history . Use of alcohol, tobacco or illicit drugs  . Current medications and supplements . Functional ability and status . Nutritional status . Physical activity . Advanced directives . List of other physicians . Hospitalizations, surgeries, and ER visits in previous 12 months . Vitals . Screenings to include cognitive, depression, and falls . Referrals and appointments  In addition, I have reviewed and discussed with patient certain preventive protocols, quality metrics, and best practice recommendations. A written personalized care plan for preventive services as well as general preventive health recommendations were provided to patient.     Varney Biles, LPN  [id number]/[id number]

## 2018-04-08 NOTE — Patient Instructions (Addendum)
  Ms. Garguilo , Thank you for taking time to come for your Medicare Wellness Visit. I appreciate your ongoing commitment to your health goals. Please review the following plan we discussed and let me know if I can assist you in the future.   Follow up as needed.    Bring a copy of your [place] and/or Living Will to be scanned into chart once completed  Check with your local gym regarding the Eugenio Saenz program.  This is a free service covered by your insurance.   Have a great day!  These are the goals we discussed: Goals    . Increase physical activity     Recumbent bike 3-5 days weekly, 30 minutes Weights for strength         This is a list of the screening recommended for you and due dates:  Health Maintenance  Topic Date Due  . Eye exam for diabetics  05/04/2017  . Flu Shot  03/27/2018  . Hemoglobin A1C  06/03/2018  . Complete foot exam   11/16/2018  . Colon Cancer Screening  12/20/2019  . Mammogram  02/11/2020  . Tetanus Vaccine  10/15/2023  . DEXA scan (bone density measurement)  Completed  .  Hepatitis C: One time screening is recommended by Center for Disease Control  (CDC) for  adults born from [birth year] through 1964.   Completed  . Pneumonia vaccines  Completed

## 2018-04-10 ENCOUNTER — Telehealth: Payer: Self-pay

## 2018-04-10 DIAGNOSIS — M79672 Pain in left foot: Secondary | ICD-10-CM

## 2018-04-10 NOTE — Telephone Encounter (Signed)
Please advise 

## 2018-04-10 NOTE — Telephone Encounter (Signed)
Copied from [place] [phone]. Topic: Referral - Request >> Apr 10, 2018  1:23 PM Hewitt Shorts wrote: Pt has been having issues with her foot and was seen by [facility] and she did not have a good experience with that office and would like to know if she could be referred  To [facility] number

## 2018-04-10 NOTE — Telephone Encounter (Signed)
Referral placed.

## 2018-04-10 NOTE — Addendum Note (Signed)
Addended by: Leone Haven on: 04/10/2018 03:26 PM   Modules accepted: Orders

## 2018-04-15 NOTE — Progress Notes (Signed)
I have reviewed the above note and agree.  Eric Sonnenberg, M.D.  

## 2018-05-08 ENCOUNTER — Other Ambulatory Visit (HOSPITAL_COMMUNITY): Payer: Self-pay | Admitting: Student

## 2018-05-22 LAB — HM DIABETES EYE EXAM

## 2018-06-17 NOTE — Progress Notes (Signed)
Cardiology Office Note:    Date:  06/18/2018   ID:  [name], Heffern December 14, 1945, [name]  PCP:  Leone Haven, MD  Cardiologist:  No primary care provider on file.    Referring MD: Leone Haven, MD   Chief Complaint  Patient presents with  . Congestive Heart Failure    History of Present Illness:    Betty [name] is a 72 y.o. female with a hx of NICM with previous EF 25% (improved to normal), HL, gout, glucose intol, normal cors by [facility] in 2003, OSA. Referred by Dr. Harrington Challenger for further evaluation of exertional dyspnea.   She developed increasing shortness of breath over the course of 2 years about 2011 to 2013. She had an extensive Pulmonary work-up at [facility] and has been seen by Dr. Lake Bells. Pulmonary function testing showed restrictive lung disease with a markedly depressed DLCO at 35% predicted. CT scanning of her chest showed upper lobe groundglass abnormalities versus air trapping. A home polysomnogram showed an AHI of 12 and multiple desaturation events to 80% independent of apneas. An abg showed resting hypercarbia (pCO2 52), and a MIP/MEP was 36%/28% predicted. A follow up diaphragm study was normal. A repeat CT scan was performed, no pulmonary embolism was seen and radiology commented on atelectasis but no clear intersitial lung disease. Blood work including an HP panel and serologies for connective tissue diseases has been essentially negative (two partial aspergillus bands on HP panel). Two echocardiograms have not shown evidence of pulmonary hypertension. After starting on CPAP with nasal pillows and oxygen with exertion and sleep she started feeling better.  She had an open lung biopsy at [facility] in November of 2014 which showed small amounts of fibrosis around her pulmonary arteries as well as findings consistent with very mild bronchiolitis.  Resting RHC was normal 12/2012 Referred to [facility] (Dr. Gilles Chiquito).  RHC with exercise in 3/15 showed  marked increase in pulmonary pressures and PCWP with exercise with no change in PVR. O2 sats down to 77% with exercise. Corrected with O2. PA rest 36/20 (26)  PCWP 15 with exercise PA 78/40 (56) PCWP 28  This was felt to reflect diastolic dysfunction and intrinsic lung disease.  Lung bx was not suggestive of clear lung pathology.  CT was neg for PE.  Continued management of diastolic CHF was been recommended.     Studies:  - RHC (5/14):  RA 9, RV 32/8, PA 30/16/23, PCWP 12, 2.1 WU, CO 5.3, CI 2.7  - Echo (3/14):  EF 55-60%, no RWMA, Gr 1 DD, mild MR  - Nuclear (11/13):  Breast atten, no ischemia, EF 55%; Low Risk   - PFTs. [place] 2014: FVC 1.46 L (48% pred), TLC 2.77 L (55% pred) ERV 0.03 (3% pred), DLCO 8.4 (35% pred)  - PFTs FVC 1.74 (55%), TLC 3.19 (61%), DLCO 15.89 (62%)  She has been followed by advanced heart failure clinic with Dr. Haroldine Laws.  He last saw her in May of this year and shortness of breath had significantly improved with rehab and diuresis.  She was near heart association class II at that time.  She is on home O2 with CPAP followed by Dr. Lake Bells.  She takes Lasix just on as-needed basis.  She was discharged from the heart failure program and is now here to reestablish cardiac care with a primary cardiologist.  She is here today for followup and is doing well.  She denies any chest pain or pressure, SOB,  DOE, PND, orthopnea, LE edema, dizziness, palpitations or syncope. She is compliant with her meds and is tolerating meds with no SE.    Past Medical History:  Diagnosis Date  . Allergic rhinitis    never tested, fall and spring  . Arthritis    hands,knees, feet  . Cataracts, bilateral    not surgical yet  . CHOLECYSTECTOMY, HX OF 10/08/2007   Qualifier: Diagnosis of  By: Council Mechanic MD, [name]   . Chronic diastolic CHF (congestive heart failure) ([place])   . Chronic respiratory failure ([place])   . Diabetes mellitus without complication ([place])   . Diverticulosis    problems  with frequent gas, burping  . Glucose intolerance (impaired glucose tolerance)   . Gout   . History of chicken pox   . Hyperlipidemia   . Hypertension   . NICM (nonischemic cardiomyopathy) ([place]) 2002   EF 25%; improved to normal - echo 4/08: EF 50-55%, mild MR, mild LAE, mild TR;    cath 3/03: normal cors, EF 40%  . Obesity   . PONV (postoperative nausea and vomiting)   . Restrictive lung disease   . Skin cancer    skin cancers only  . Sleep apnea    cpap settings 4    Past Surgical History:  Procedure Laterality Date  . BILATERAL VATS ABLATION Right    biopsy done 2015- [facility]  . BREAST EXCISIONAL BIOPSY Left 80's   NEG  . CARDIAC CATHETERIZATION    . choleystectomy  02/2002  . COLONOSCOPY WITH PROPOFOL N/A 12/20/2014   Procedure: COLONOSCOPY WITH PROPOFOL;  Surgeon: [name], MD;  Location: [facility];  Service: Gastroenterology;  Laterality: N/A;  . CYSTECTOMY  1996   l breast  . DILATION AND CURETTAGE OF UTERUS  2011   Dr. Hulan Fray at [facility]  . [place]    . nsvd     x 2  . TUBAL LIGATION  1975  . VAGINAL DELIVERY     x2    Current Medications: Current Meds  Medication Sig  . aspirin 81 MG EC tablet Take 1 tablet (81 mg total) by mouth daily.  . Calcium Carbonate (CALTRATE 600 PO) Take by mouth every morning.  . carvedilol (COREG) 12.5 MG tablet TAKE 1 TABLET BY MOUTH TWICE A DAY WITH A MEAL  . Coenzyme Q10 (CO Q 10) 100 MG CAPS Take 100 mg by mouth at bedtime.   . Fish Oil OIL Take 227 mcg by mouth 2 (two) times daily. GUMMIES  . furosemide (LASIX) 40 MG tablet TAKE 1 TAB ONCE DAILY. TAKE EXTRA TAB ONCE DAILY AS NEEDED FOR WEIGHT GAIN OR SWELLING  . KLOR-CON M20 20 MEQ tablet TAKE 2 TABLETS (40 MEQ TOTAL) BY MOUTH EVERY MORNING AND 1 TABLET (20 MEQ TOTAL) EVERY EVENING.  Marland Kitchen losartan (COZAAR) 50 MG tablet Take 0.5 tablets (25 mg total) by mouth daily.  . multivitamin (THERAGRAN) per tablet Take 1 tablet by mouth daily.   . polyvinyl alcohol  (LIQUIFILM TEARS) 1.4 % ophthalmic solution Place 1 drop into both eyes every morning.  . simvastatin (ZOCOR) 20 MG tablet Take 1 tablet (20 mg total) by mouth at bedtime.     Allergies:   Amoxicillin; Etodolac; Hydrochlorothiazide; Meloxicam; Sulfa antibiotics; Allopurinol; Metformin and related; and Sulfonamide derivatives   Social History   Socioeconomic History  . Marital status: Married    Spouse name: Not on file  . Number of children: 2  . Years of education: Not on  file  . Highest education level: Not on file  Occupational History  . Occupation: Retired Product manager: retired  . Occupation: Works at [employer]  . Financial resource strain: Not hard at all  . Food insecurity:    Worry: Never true    Inability: Never true  . Transportation needs:    Medical: No    Non-medical: No  Tobacco Use  . Smoking status: Never Smoker  . Smokeless tobacco: Never Used  Substance and Sexual Activity  . Alcohol use: No    Alcohol/week: 0.0 standard drinks  . Drug use: No  . Sexual activity: Never  Lifestyle  . Physical activity:    Days per week: 0 days    Minutes per session: 0 min  . Stress: Not on file  Relationships  . Social connections:    Talks on phone: Not on file    Gets together: Not on file    Attends religious service: Not on file    Active member of club or organization: Not on file    Attends meetings of clubs or organizations: Not on file    Relationship status: Not on file  Other Topics Concern  . Not on file  Social History Narrative   Lives in [place] with husband. 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 No pets.     Family History: The patient's family history includes COPD in her father; Cancer in her maternal grandfather and maternal grandmother; Cancer (age of onset: 45) in her mother; Depression in her sister; Diabetes in her father, paternal grandfather, and sister; Heart attack in her father; Heart disease  in her father and paternal grandfather; Hyperlipidemia in her father; Hypertension in her brother and father; Lymphoma in her mother; Meniere's disease in her brother; Pneumonia in her father; Schizophrenia in her daughter; Stroke in her father. There is no history of Colon cancer, Stomach cancer, or Breast cancer.  ROS:   Please see the history of present illness.    ROS  All other systems reviewed and negative.   EKGs/Labs/Other Studies Reviewed:    The following studies were reviewed today: none  EKG:  EKG is  ordered today.  The ekg ordered today demonstrates normal sinus rhythm 87 bpm with left axis deviation and left anterior fascicular block  Recent Labs: 09/19/2017: B Natriuretic Peptide 30.7; BUN 14; Creatinine, Ser 0.61; Hemoglobin 13.1; Platelets 221; Potassium 4.3; Sodium 139   Recent Lipid Panel    Component Value Date/Time   CHOL 159 04/30/2017 1048   TRIG 192.0 (H) 04/30/2017 1048   HDL 62.50 04/30/2017 1048   CHOLHDL 3 04/30/2017 1048   VLDL 38.4 04/30/2017 1048   LDLCALC 58 04/30/2017 1048   LDLDIRECT 60.0 01/27/2016 1024    Physical Exam:    VS:  BP 114/64   Pulse 87   Ht 5\' 5"  (1.651 m)   Wt 196 lb 12.8 oz (89.3 kg)   SpO2 92%   BMI 32.75 kg/m     Wt Readings from Last 3 Encounters:  06/18/18 196 lb 12.8 oz (89.3 kg)  04/08/18 199 lb 12.8 oz (90.6 kg)  12/25/17 190 lb 1.9 oz (86.2 kg)     GEN:  Well nourished, well developed in no acute distress HEENT: Normal NECK: No JVD; No carotid bruits LYMPHATICS: No lymphadenopathy CARDIAC: RRR, no murmurs, rubs, gallops RESPIRATORY:  Clear to auscultation without rales, wheezing or rhonchi  ABDOMEN: Soft, non-tender, non-distended MUSCULOSKELETAL:  No edema;  No deformity  SKIN: Warm and dry NEUROLOGIC:  Alert and oriented x 3 PSYCHIATRIC:  Normal affect   ASSESSMENT:    1. DCM (dilated cardiomyopathy) ([place])   2. Chronic diastolic heart failure ([place])   3. Obstructive sleep apnea   4. Dyspnea,  unspecified type    PLAN:    In order of problems listed above:   1.  Chronic diastolic CHF -she appears euvolemic on exam today and her weight is stable.  Repeat 2D echocardiogram on 12/31/2017 showed normal LV function with EF 55 to 60% with grade 1 diastolic dysfunction.  2.  OSA - She is on O2 and CPAP and is followed by Dr. Lake Bells.   3.  SOB -this is very stable and has been felt to be  from chronic diastolic CHF   Medication Adjustments/Labs and Tests Ordered: Current medicines are reviewed at length with the patient today.  Concerns regarding medicines are outlined above.  Orders Placed This Encounter  Procedures  . EKG 12-Lead   No orders of the defined types were placed in this encounter.   Signed, Fransico Him, MD  06/18/2018 8:15 AM    [place]

## 2018-06-18 ENCOUNTER — Encounter: Payer: Self-pay | Admitting: Cardiology

## 2018-06-18 ENCOUNTER — Ambulatory Visit: Payer: Medicare Other | Admitting: Cardiology

## 2018-06-18 VITALS — BP 114/64 | HR 87 | Ht 65.0 in | Wt 196.8 lb

## 2018-06-18 DIAGNOSIS — I420 Dilated cardiomyopathy: Secondary | ICD-10-CM | POA: Diagnosis not present

## 2018-06-18 DIAGNOSIS — R0600 Dyspnea, unspecified: Secondary | ICD-10-CM | POA: Diagnosis not present

## 2018-06-18 DIAGNOSIS — I5032 Chronic diastolic (congestive) heart failure: Secondary | ICD-10-CM

## 2018-06-18 DIAGNOSIS — G4733 Obstructive sleep apnea (adult) (pediatric): Secondary | ICD-10-CM

## 2018-06-18 NOTE — Patient Instructions (Signed)
Medication Instructions:  Your physician recommends that you continue on your current medications as directed. Please refer to the Current Medication list given to you today.  If you need a refill on your cardiac medications before your next appointment, please call your pharmacy.   Lab work:  If you have labs (blood work) drawn today and your tests are completely normal, you will receive your results only by: Marland Kitchen MyChart Message (if you have MyChart) OR . A paper copy in the mail If you have any lab test that is abnormal or we need to change your treatment, we will call you to review the results.  Follow-Up: At [facility], you and your health needs are our priority.  As part of our continuing mission to provide you with exceptional heart care, we have created designated Provider Care Teams.  These Care Teams include your primary Cardiologist (physician) and Advanced Practice Providers (APPs -  Physician Assistants and Nurse Practitioners) who all work together to provide you with the care you need, when you need it. You will need a follow up appointment in 6 months.  Please call our office 2 months in advance to schedule this appointment.  You may see Dr. Radford Pax or one of the following Advanced Practice Providers on your designated Care Team:   [place], PA-C Melina Copa, PA-C . Ermalinda Barrios, PA-C

## 2018-08-03 ENCOUNTER — Other Ambulatory Visit (HOSPITAL_COMMUNITY): Payer: Self-pay | Admitting: Student

## 2018-08-21 ENCOUNTER — Other Ambulatory Visit (HOSPITAL_COMMUNITY): Payer: Self-pay | Admitting: Student

## 2018-08-25 ENCOUNTER — Other Ambulatory Visit (HOSPITAL_COMMUNITY): Payer: Self-pay | Admitting: Student

## 2018-08-25 ENCOUNTER — Other Ambulatory Visit: Payer: Self-pay

## 2018-08-25 NOTE — Telephone Encounter (Signed)
Pt called in requesting refills on carvedilol; this medication already has pending reorders from prescribing physician.

## 2018-08-25 NOTE — Telephone Encounter (Signed)
OK to refill

## 2018-10-14 ENCOUNTER — Other Ambulatory Visit (HOSPITAL_COMMUNITY): Payer: Self-pay | Admitting: Student

## 2018-10-14 NOTE — Telephone Encounter (Signed)
Future refills should be sent to current cardiologist

## 2018-12-16 ENCOUNTER — Ambulatory Visit: Payer: Medicare Other | Admitting: Cardiology

## 2018-12-17 ENCOUNTER — Telehealth: Payer: Self-pay | Admitting: Cardiology

## 2018-12-17 NOTE — Telephone Encounter (Signed)
Video/doxy.me/smartphone/verbal consent 12/17/18/vitals   YOUR CARDIOLOGY TEAM HAS ARRANGED FOR AN E-VISIT FOR YOUR APPOINTMENT - PLEASE REVIEW IMPORTANT INFORMATION BELOW SEVERAL DAYS PRIOR TO YOUR APPOINTMENT  Due to the recent COVID-19 pandemic, we are transitioning in-person office visits to tele-medicine visits in an effort to decrease unnecessary exposure to our patients, their families, and staff. These visits are billed to your insurance just like a normal visit is. We also encourage you to sign up for MyChart if you have not already done so. You will need a smartphone if possible. For patients that do not have this, we can still complete the visit using a regular telephone but do prefer a smartphone to enable video when possible. You may have a family member that lives with you that can help. If possible, we also ask that you have a blood pressure cuff and scale at home to measure your blood pressure, heart rate and weight prior to your scheduled appointment. Patients with clinical needs that need an in-person evaluation and testing will still be able to come to the office if absolutely necessary. If you have any questions, feel free to call our office.     YOUR PROVIDER WILL BE USING THE FOLLOWING PLATFORM TO COMPLETE YOUR VISIT:   [email or web address]   IF USING MYCHART - How to Download the MyChart App to Your SmartPhone   - If Apple, go to [employer] and type in MyChart in the search bar and download the app. If Android, ask patient to go to [employer] and type in [place] in the search bar and download the app. The app is free but as with any other app downloads, your phone may require you to verify saved payment information or Apple/Android password.  - You will need to then log into the app with your MyChart username and password, and select [facility] as your healthcare provider to link the account.  - When it is time for your visit, go to the MyChart app, find appointments, and click  Begin Video Visit. Be sure to Select Allow for your device to access the Microphone and Camera for your visit. You will then be connected, and your provider will be with you shortly.  **If you have any issues connecting or need assistance, please contact MyChart service desk [phone])**  **If using a computer, in order to ensure the best quality for your visit, you will need to use either of the following Internet Browsers: Insurance underwriter or Microsoft Edge**   IF USING DOXIMITY or [email or web address] - The staff will give you instructions on receiving your link to join the meeting the day of your visit.      2-3 DAYS BEFORE YOUR APPOINTMENT  You will receive a telephone call from one of our [place] team members - your caller ID may say "Unknown caller." If this is a video visit, we will walk you through how to get the video launched on your phone. We will remind you check your blood pressure, heart rate and weight prior to your scheduled appointment. If you have an Apple Watch or Kardia, please upload any pertinent ECG strips the day before or morning of your appointment to [place]. Our staff will also make sure you have reviewed the consent and agree to move forward with your scheduled tele-health visit.     THE DAY OF YOUR APPOINTMENT  Approximately 15 minutes prior to your scheduled appointment, you will receive a telephone call from one of [facility] team - your  caller ID may say "Unknown caller."  Our staff will confirm medications, vital signs for the day and any symptoms you may be experiencing. Please have this information available prior to the time of visit start. It may also be helpful for you to have a pad of paper and pen handy for any instructions given during your visit. They will also walk you through joining the smartphone meeting if this is a video visit.    CONSENT FOR TELE-HEALTH VISIT - PLEASE REVIEW  I hereby voluntarily request, consent and authorize [facility] and its employed or contracted physicians, physician assistants, nurse practitioners or other licensed health care professionals (the Practitioner), to provide me with telemedicine health care services (the Services") as deemed necessary by the treating Practitioner. I acknowledge and consent to receive the Services by the Practitioner via telemedicine. I understand that the telemedicine visit will involve communicating with the Practitioner through live audiovisual communication technology and the disclosure of certain medical information by electronic transmission. I acknowledge that I have been given the opportunity to request an in-person assessment or other available alternative prior to the telemedicine visit and am voluntarily participating in the telemedicine visit.  I understand that I have the right to withhold or withdraw my consent to the use of telemedicine in the course of my care at any time, without affecting my right to future care or treatment, and that the Practitioner or I may terminate the telemedicine visit at any time. I understand that I have the right to inspect all information obtained and/or recorded in the course of the telemedicine visit and may receive copies of available information for a reasonable fee.  I understand that some of the potential risks of receiving the Services via telemedicine include:   Delay or interruption in medical evaluation due to technological equipment failure or disruption;  Information transmitted may not be sufficient (e.g. poor resolution of images) to allow for appropriate medical decision making by the Practitioner; and/or   In rare instances, security protocols could fail, causing a breach of personal health information.  Furthermore, I acknowledge that it is my responsibility to provide information about my medical history, conditions and care that is complete and accurate to the best of my ability. I acknowledge that Practitioner's  advice, recommendations, and/or decision may be based on factors not within their control, such as incomplete or inaccurate data provided by me or distortions of diagnostic images or specimens that may result from electronic transmissions. I understand that the practice of medicine is not an exact science and that Practitioner makes no warranties or guarantees regarding treatment outcomes. I acknowledge that I will receive a copy of this consent concurrently upon execution via email to the email address I last provided but may also request a printed copy by calling the office of [facility].    I understand that my insurance will be billed for this visit.   I have read or had this consent read to me.  I understand the contents of this consent, which adequately explains the benefits and risks of the Services being provided via telemedicine.   I have been provided ample opportunity to ask questions regarding this consent and the Services and have had my questions answered to my satisfaction.  I give my informed consent for the services to be provided through the use of telemedicine in my medical care  By participating in this telemedicine visit I agree to the above.

## 2018-12-21 NOTE — Progress Notes (Signed)
Virtual Visit via Video Note   This visit type was conducted due to national recommendations for restrictions regarding the COVID-19 Pandemic (e.g. social distancing) in an effort to limit this patient's exposure and mitigate transmission in our community.  Due to her co-morbid illnesses, this patient is at least at moderate risk for complications without adequate follow up.  This format is felt to be most appropriate for this patient at this time.  All issues noted in this document were discussed and addressed.  A limited physical exam was performed with this format.  Please refer to the patient's chart for her consent to telehealth for [facility].   Evaluation Performed:  Follow-up visit  This visit type was conducted due to national recommendations for restrictions regarding the COVID-19 Pandemic (e.g. social distancing).  This format is felt to be most appropriate for this patient at this time.  All issues noted in this document were discussed and addressed.  No physical exam was performed (except for noted visual exam findings with Video Visits).  Please refer to the patient's chart (MyChart message for video visits and phone note for telephone visits) for the patient's consent to telehealth for [facility].  Date:  12/22/2018   ID:  Betty, [name] 08-11-1946, MRN [id number]  Patient Location:  Home  Provider location:   [place] , PCP:  Leone Haven, MD  Cardiologist:  Fransico Him, MD Electrophysiologist:  None   Chief Complaint:  DCM, CHF, pulmonary HTN  History of Present Illness:    Betty [name] is a 73 y.o. female who presents via audio/video conferencing for a telehealth visit today.    Betty [name] is a 73 y.o. female with a hx of NICM with previous EF 25% (improved to normal), HL, gout, glucose intol, normal cors by [facility] in 2003, OSA.  She also had chronic shortness of breath with PFT showing restrictive lung disease with markedly depressed  DLCO at 35% predicted.  CT scan showed upper lobe groundglass abnormalities versus air trapping.  Home sleep study showed mild obstructive sleep apnea with an AHI of 12 and oxygen desaturations to 80% independent of apneas.  Been seen by pulmonary with an extensive work-up.  Blood work including an HP panel and serologies for connective tissue diseases have all been normal.  2 echoes have showed no evidence of pulmonary hypertension.  After starting CPAP she was feeling better.  An open lung biopsy at [facility] in 2014 showed small amounts of fibrosis and pulmonary arteries as well as very mild bronchiolitis  Resting RHC was normal 12/2012 Referred to [facility] (Dr. Gilles Chiquito). RHC with exercise in 3/15 showed marked increase in pulmonary pressures and PCWP with exercise with no change in PVR. O2 sats down to 77% with exercise. Corrected with O2. PA rest 36/20 (26) PCWP 15 with exercise PA 78/40 (56) PCWP 28 This was felt to reflect diastolic dysfunction and intrinsic lung disease. Lung bx was not suggestive of clear lung pathology. CT was neg for PE. Continued management of diastolic CHF was been recommended.   Studies: - RHC (5/14): RA 9, RV 32/8, PA 30/16/23, PCWP 12, 2.1 WU, CO 5.3, CI 2.7 - Echo (3/14): EF 55-60%, no RWMA, Gr 1 DD, mild MR - Nuclear (11/13): Breast atten, no ischemia, EF 55%; Low Risk  - PFTs. [place] 2014: FVC 1.46 L (48% pred), TLC 2.77 L (55% pred) ERV 0.03 (3% pred), DLCO 8.4 (35% pred) - PFTs FVC 1.74 (55%), TLC 3.19 (61%), DLCO  15.89 (62%)  She has NYHA class 1 CHF.  She is on home O2 with CPAP followed by Dr. Lake Bells that she uses only at night.  She takes Lasix on an as-needed basis and has not had to use in over 6-12 months. She is here today for followup and is doing well.  She denies any chest pain or pressure, SOB (except during allergy season), DOE, PND, orthopnea, LE edema, dizziness, palpitations or syncope. She is compliant with her meds and is tolerating meds with  no SE.    The patient does not have symptoms concerning for COVID-19 infection (fever, chills, cough, or new shortness of breath).    Prior CV studies:   The following studies were reviewed today:  2D echo 12/2017 with normal LVF and G1DD with  no pulmonary HTN  Past Medical History:  Diagnosis Date  . Allergic rhinitis    never tested, fall and spring  . Arthritis    hands,knees, feet  . Cataracts, bilateral    not surgical yet  . CHOLECYSTECTOMY, HX OF 10/08/2007   Qualifier: Diagnosis of  By: Council Mechanic MD, [name]   . Chronic diastolic CHF (congestive heart failure) ([place])   . Chronic respiratory failure ([place])   . Diabetes mellitus without complication ([place])   . Diverticulosis    problems with frequent gas, burping  . Glucose intolerance (impaired glucose tolerance)   . Gout   . History of chicken pox   . Hyperlipidemia   . Hypertension   . NICM (nonischemic cardiomyopathy) ([place]) 2002   EF 25%; improved to normal - echo 4/08: EF 50-55%, mild MR, mild LAE, mild TR;    cath 3/03: normal cors, EF 40%  . Obesity   . PONV (postoperative nausea and vomiting)   . Restrictive lung disease   . Skin cancer    skin cancers only  . Sleep apnea    cpap settings 4   Past Surgical History:  Procedure Laterality Date  . BILATERAL VATS ABLATION Right    biopsy done 2015- [facility]  . BREAST EXCISIONAL BIOPSY Left 80's   NEG  . CARDIAC CATHETERIZATION    . choleystectomy  02/2002  . COLONOSCOPY WITH PROPOFOL N/A 12/20/2014   Procedure: COLONOSCOPY WITH PROPOFOL;  Surgeon: [name], MD;  Location: [facility] ENDOSCOPY;  Service: Gastroenterology;  Laterality: N/A;  . CYSTECTOMY  1996   l breast  . DILATION AND CURETTAGE OF UTERUS  2011   Dr. Hulan Fray at [facility]  . [place]    . nsvd     x 2  . TUBAL LIGATION  1975  . VAGINAL DELIVERY     x2     Current Meds  Medication Sig  . aspirin 81 MG EC tablet Take 1 tablet (81 mg total) by mouth daily.  . Calcium Carbonate  (CALTRATE 600 PO) Take by mouth every morning.  . carvedilol (COREG) 12.5 MG tablet TAKE 1 TABLET BY MOUTH TWICE A DAY WITH A MEAL  . Coenzyme Q10 (CO Q 10) 100 MG CAPS Take 100 mg by mouth at bedtime.   . Fish Oil OIL Take 227 mcg by mouth 2 (two) times daily. GUMMIES  . furosemide (LASIX) 40 MG tablet Take 40 mg by mouth as needed.  Marland Kitchen losartan (COZAAR) 50 MG tablet TAKE 0.5 TABLETS (25 MG TOTAL) BY MOUTH DAILY.  . multivitamin (THERAGRAN) per tablet Take 1 tablet by mouth daily.   . polyvinyl alcohol (LIQUIFILM TEARS) 1.4 % ophthalmic solution Place  1 drop into both eyes every morning.  . Potassium Chloride Crys ER (KLOR-CON M20 PO) Take 20 mEq by mouth 2 (two) times a day.  . simvastatin (ZOCOR) 20 MG tablet TAKE 1 TABLET BY MOUTH EVERYDAY AT BEDTIME     Allergies:   Amoxicillin; Etodolac; Hydrochlorothiazide; Meloxicam; Sulfa antibiotics; Allopurinol; Metformin and related; and Sulfonamide derivatives   Social History   Tobacco Use  . Smoking status: Never Smoker  . Smokeless tobacco: Never Used  Substance Use Topics  . Alcohol use: No    Alcohol/week: 0.0 standard drinks  . Drug use: No     Family Hx: The patient's family history includes COPD in her father; Cancer in her maternal grandfather and maternal grandmother; Cancer (age of onset: 59) in her mother; Depression in her sister; Diabetes in her father, paternal grandfather, and sister; Heart attack in her father; Heart disease in her father and paternal grandfather; Hyperlipidemia in her father; Hypertension in her brother and father; Lymphoma in her mother; Meniere's disease in her brother; Pneumonia in her father; Schizophrenia in her daughter; Stroke in her father. There is no history of Colon cancer, Stomach cancer, or Breast cancer.  ROS:   Please see the history of present illness.     All other systems reviewed and are negative.   Labs/Other Tests and Data Reviewed:    Recent Labs: No results found for requested  labs within last 8760 hours.   Recent Lipid Panel Lab Results  Component Value Date/Time   CHOL 159 04/30/2017 10:48 AM   TRIG 192.0 (H) 04/30/2017 10:48 AM   HDL 62.50 04/30/2017 10:48 AM   CHOLHDL 3 04/30/2017 10:48 AM   LDLCALC 58 04/30/2017 10:48 AM   LDLDIRECT 60.0 01/27/2016 10:24 AM    Wt Readings from Last 3 Encounters:  12/22/18 195 lb (88.5 kg)  06/18/18 196 lb 12.8 oz (89.3 kg)  04/08/18 199 lb 12.8 oz (90.6 kg)     Objective:    Vital Signs:  BP (!) 109/58   Pulse 78   Ht 5\' 5"  (1.651 m)   Wt 195 lb (88.5 kg)   BMI 32.45 kg/m    CONSTITUTIONAL:  Well nourished, well developed female in no acute distress.  EYES: anicteric MOUTH: oral mucosa is pink RESPIRATORY: Normal respiratory effort, symmetric expansion CARDIOVASCULAR: No peripheral edema SKIN: No rash, lesions or ulcers MUSCULOSKELETAL: no digital cyanosis NEURO: Cranial Nerves II-XII grossly intact, moves all extremities PSYCH: Intact judgement and insight.  A&O x 3, Mood/affect appropriate   ASSESSMENT & PLAN:    1.  Chronic diastolic CHF - she has not had any SOB or edema recently except with allergies.  She does not think she is volume overloaded.  She will continue on Lasix 40 mg as needed for edema.  She has not taken any in 6-12 months.  2.  OSA - followed by Dr. Lake Bells  3.  SOB - this is chronic secondary to chronic diastolic CHF and is stable.  She has not had any SOB recently except with allergies.  Creatinine was 0.61 on 09/19/2017.  I will repeat a Bmet once the COVID crisis has improved.  4.  Type 2 diabetes mellitus -followed by her PCP.  Her A1c was 6.2 on 12/02/2017.  This appears diet controlled.  She is on losartan for renal protection.  5.  Hypertension  - her blood pressure is well controlled today.  She will continue on losartan 25 mg daily and carvedilol 12.5 mg twice daily.  6  COVID-19 Education:The signs and symptoms of COVID-19 were discussed with the patient and how to seek  care for testing (follow up with PCP or arrange E-visit).  The importance of social distancing was discussed today.  Patient Risk:   After full review of this patient's clinical status, I feel that they are at least moderate risk at this time.  Time:   Today, I have spent 15 minutes directly with the patient on video discussing medical problems including CHF, SOB.  We also reviewed the symptoms of COVID 19 and the ways to protect against contracting the virus with telehealth technology.  I spent an additional 5 minutes reviewing patient's chart including 2D echo .  Medication Adjustments/Labs and Tests Ordered: Current medicines are reviewed at length with the patient today.  Concerns regarding medicines are outlined above.  Tests Ordered: No orders of the defined types were placed in this encounter.  Medication Changes: No orders of the defined types were placed in this encounter.   Disposition:  Follow up in 1 year(s)  Signed, Fransico Him, MD  12/22/2018 10:09 AM    [facility]

## 2018-12-22 ENCOUNTER — Telehealth (INDEPENDENT_AMBULATORY_CARE_PROVIDER_SITE_OTHER): Payer: Medicare Other | Admitting: Cardiology

## 2018-12-22 ENCOUNTER — Encounter: Payer: Self-pay | Admitting: Cardiology

## 2018-12-22 ENCOUNTER — Other Ambulatory Visit: Payer: Self-pay

## 2018-12-22 VITALS — BP 109/58 | HR 78 | Ht 65.0 in | Wt 195.0 lb

## 2018-12-22 DIAGNOSIS — E1121 Type 2 diabetes mellitus with diabetic nephropathy: Secondary | ICD-10-CM | POA: Diagnosis not present

## 2018-12-22 DIAGNOSIS — I10 Essential (primary) hypertension: Secondary | ICD-10-CM

## 2018-12-22 DIAGNOSIS — I5032 Chronic diastolic (congestive) heart failure: Secondary | ICD-10-CM

## 2018-12-22 DIAGNOSIS — R0602 Shortness of breath: Secondary | ICD-10-CM | POA: Diagnosis not present

## 2018-12-22 DIAGNOSIS — Z7189 Other specified counseling: Secondary | ICD-10-CM | POA: Diagnosis not present

## 2018-12-22 DIAGNOSIS — G4733 Obstructive sleep apnea (adult) (pediatric): Secondary | ICD-10-CM

## 2018-12-22 NOTE — Patient Instructions (Signed)
Medication Instructions:  Your physician recommends that you continue on your current medications as directed. Please refer to the Current Medication list given to you today.  If you need a refill on your cardiac medications before your next appointment, please call your pharmacy.   Lab work: BMET: 03/09/19 If you have labs (blood work) drawn today and your tests are completely normal, you will receive your results only by: Marland Kitchen MyChart Message (if you have MyChart) OR . A paper copy in the mail If you have any lab test that is abnormal or we need to change your treatment, we will call you to review the results.  Testing/Procedures: None  Follow-Up: At [facility], you and your health needs are our priority.  As part of our continuing mission to provide you with exceptional heart care, we have created designated Provider Care Teams.  These Care Teams include your primary Cardiologist (physician) and Advanced Practice Providers (APPs -  Physician Assistants and Nurse Practitioners) who all work together to provide you with the care you need, when you need it. You will need a follow up appointment in 1 years.  Please call our office 2 months in advance to schedule this appointment.  You may see Dr. Radford Pax or one of the following Advanced Practice Providers on your designated Care Team:   [place], PA-C Melina Copa, PA-C . Ermalinda Barrios, PA-C

## 2018-12-31 ENCOUNTER — Other Ambulatory Visit: Payer: Self-pay | Admitting: Family Medicine

## 2018-12-31 DIAGNOSIS — Z1231 Encounter for screening mammogram for malignant neoplasm of breast: Secondary | ICD-10-CM

## 2019-01-14 ENCOUNTER — Other Ambulatory Visit (HOSPITAL_COMMUNITY): Payer: Self-pay

## 2019-01-14 MED ORDER — POTASSIUM CHLORIDE CRYS ER 20 MEQ PO TBCR: 20.0000 meq | EXTENDED_RELEASE_TABLET | Freq: Two times a day (BID) | ORAL | 1 refills | Status: DC

## 2019-03-03 ENCOUNTER — Ambulatory Visit
Admission: RE | Admit: 2019-03-03 | Discharge: 2019-03-03 | Disposition: A | Discharge status: Home / Self Care | Payer: Medicare Other | Source: Ambulatory Visit | Attending: Family Medicine | Admitting: Family Medicine

## 2019-03-03 ENCOUNTER — Other Ambulatory Visit: Payer: Self-pay

## 2019-03-03 DIAGNOSIS — Z1231 Encounter for screening mammogram for malignant neoplasm of breast: Secondary | ICD-10-CM | POA: Diagnosis present

## 2019-03-09 ENCOUNTER — Other Ambulatory Visit: Payer: Self-pay

## 2019-03-09 ENCOUNTER — Other Ambulatory Visit: Payer: Medicare Other | Admitting: *Deleted

## 2019-03-09 DIAGNOSIS — I5032 Chronic diastolic (congestive) heart failure: Secondary | ICD-10-CM

## 2019-03-09 LAB — BASIC METABOLIC PANEL
BUN/Creatinine Ratio: 26 (ref 12–28)
BUN: 16 mg/dL (ref 8–27)
CO2: 28 mmol/L (ref 20–29)
Calcium: 10 mg/dL (ref 8.7–10.3)
Chloride: 98 mmol/L (ref 96–106)
Creatinine, Ser: 0.62 mg/dL (ref 0.57–1.00)
GFR calc Af Amer: 104 mL/min/{1.73_m2} (ref >59)
GFR calc non Af Amer: 90 mL/min/{1.73_m2} (ref >59)
Glucose: 145 mg/dL — ABNORMAL HIGH (ref 65–99)
Potassium: 4.3 mmol/L (ref 3.5–5.2)
Sodium: 139 mmol/L (ref 134–144)

## 2019-04-10 ENCOUNTER — Ambulatory Visit (INDEPENDENT_AMBULATORY_CARE_PROVIDER_SITE_OTHER): Payer: Medicare Other

## 2019-04-10 ENCOUNTER — Other Ambulatory Visit: Payer: Self-pay

## 2019-04-10 ENCOUNTER — Ambulatory Visit (INDEPENDENT_AMBULATORY_CARE_PROVIDER_SITE_OTHER): Payer: Medicare Other | Admitting: Family Medicine

## 2019-04-10 ENCOUNTER — Encounter: Payer: Self-pay | Admitting: Family Medicine

## 2019-04-10 VITALS — BP 117/60 | HR 93 | Ht 65.0 in | Wt 205.0 lb

## 2019-04-10 VITALS — BP 117/60 | HR 93 | Wt 205.0 lb

## 2019-04-10 DIAGNOSIS — E1121 Type 2 diabetes mellitus with diabetic nephropathy: Secondary | ICD-10-CM | POA: Diagnosis not present

## 2019-04-10 DIAGNOSIS — R143 Flatulence: Secondary | ICD-10-CM | POA: Diagnosis not present

## 2019-04-10 DIAGNOSIS — E782 Mixed hyperlipidemia: Secondary | ICD-10-CM

## 2019-04-10 DIAGNOSIS — Z0000 Encounter for general adult medical examination without abnormal findings: Secondary | ICD-10-CM

## 2019-04-10 DIAGNOSIS — I5032 Chronic diastolic (congestive) heart failure: Secondary | ICD-10-CM | POA: Diagnosis not present

## 2019-04-10 NOTE — Assessment & Plan Note (Signed)
Continue simvastatin.  Check lipids.

## 2019-04-10 NOTE — Assessment & Plan Note (Signed)
Plan to check A1c. ?

## 2019-04-10 NOTE — Progress Notes (Signed)
Subjective:   Betty [name] is a 73 y.o. female who presents for Medicare Annual (Subsequent) preventive examination.  Review of Systems:  No ROS.  Medicare Wellness Virtual Visit.  Visual/audio telehealth visit. Vital signs provided by patient. See social history for additional risk factors.   Cardiac Risk Factors include: advanced age (>46men, >71 women);diabetes mellitus;sedentary lifestyle     Objective:     Vitals: BP 117/60 (BP Location: Left Arm, Patient Position: Sitting, Cuff Size: Normal)   Pulse 93   Wt 205 lb (93 kg)   BMI 34.11 kg/m   Body mass index is 34.11 kg/m.  Advanced Directives 04/10/2019 04/08/2018 03/28/2017 09/25/2016 09/20/2016 05/28/2016 03/28/2016  Does Patient Have a Medical Advance Directive? No No No No No No No  Would patient like information on creating a medical advance directive? No - Patient declined No - Patient declined No - Patient declined - - - Yes - Scientist, clinical (histocompatibility and immunogenetics) given    Tobacco Social History   Tobacco Use  Smoking Status Never Smoker  Smokeless Tobacco Never Used     Counseling given: Not Answered   Clinical Intake:  Pre-visit preparation completed: Yes        Diabetes: Yes(Followed by pcp)  How often do you need to have someone help you when you read instructions, pamphlets, or other written materials from your doctor or pharmacy?: 1 - Never  Interpreter Needed?: No     Past Medical History:  Diagnosis Date  . Allergic rhinitis    never tested, fall and spring  . Arthritis    hands,knees, feet  . Cataracts, bilateral    not surgical yet  . CHOLECYSTECTOMY, HX OF 10/08/2007   Qualifier: Diagnosis of  By: Council Mechanic MD, [name]   . Chronic diastolic CHF (congestive heart failure) ([place])   . Chronic respiratory failure ([place])   . Diabetes mellitus without complication ([place])   . Diverticulosis    problems with frequent gas, burping  . Glucose intolerance (impaired glucose tolerance)   . Gout   .  History of chicken pox   . Hyperlipidemia   . Hypertension   . NICM (nonischemic cardiomyopathy) ([place]) 2002   EF 25%; improved to normal - echo 4/08: EF 50-55%, mild MR, mild LAE, mild TR;    cath 3/03: normal cors, EF 40%  . Obesity   . PONV (postoperative nausea and vomiting)   . Restrictive lung disease   . Skin cancer    skin cancers only  . Sleep apnea    cpap settings 4   Past Surgical History:  Procedure Laterality Date  . BILATERAL VATS ABLATION Right    biopsy done 2015- [facility]  . BREAST EXCISIONAL BIOPSY Left 80's   NEG  . CARDIAC CATHETERIZATION    . choleystectomy  02/2002  . COLONOSCOPY WITH PROPOFOL N/A 12/20/2014   Procedure: COLONOSCOPY WITH PROPOFOL;  Surgeon: [name], MD;  Location: [facility];  Service: Gastroenterology;  Laterality: N/A;  . CYSTECTOMY  1996   l breast  . DILATION AND CURETTAGE OF UTERUS  2011   Dr. Hulan Fray at [facility]  . [place]    . nsvd     x 2  . TUBAL LIGATION  1975  . VAGINAL DELIVERY     x2   Family History  Problem Relation Age of Onset  . Lymphoma Mother   . Cancer Mother 31       Lymphoma  . COPD Father  was a smoker  . Stroke Father   . Pneumonia Father   . Diabetes Father   . Hyperlipidemia Father   . Heart disease Father   . Heart attack Father   . Hypertension Father   . Diabetes Sister   . Depression Sister   . Meniere's disease Brother   . Diabetes Paternal Grandfather   . Heart disease Paternal Grandfather   . Schizophrenia Daughter   . Cancer Maternal Grandmother        Bladder  . Cancer Maternal Grandfather        leukemia  . Hypertension Brother   . Colon cancer Neg Hx   . Stomach cancer Neg Hx   . Breast cancer Neg Hx    Social History   Socioeconomic History  . Marital status: Married    Spouse name: Not on file  . Number of children: 2  . Years of education: Not on file  . Highest education level: Not on file  Occupational History  . Occupation: Retired Associate Professor: retired  . Occupation: Works at [employer]  . Financial resource strain: Not hard at all  . Food insecurity    Worry: Never true    Inability: Never true  . Transportation needs    Medical: No    Non-medical: No  Tobacco Use  . Smoking status: Never Smoker  . Smokeless tobacco: Never Used  Substance and Sexual Activity  . Alcohol use: No    Alcohol/week: 0.0 standard drinks  . Drug use: No  . Sexual activity: Never  Lifestyle  . Physical activity    Days per week: 0 days    Minutes per session: 0 min  . Stress: Not on file  Relationships  . Social Herbalist on phone: Not on file    Gets together: Not on file    Attends religious service: Not on file    Active member of club or organization: Not on file    Attends meetings of clubs or organizations: Not on file    Relationship status: Not on file  Other Topics Concern  . Not on file  Social History Narrative   Lives in [place] with husband. 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 No pets.    Outpatient Encounter Medications as of 04/10/2019  Medication Sig  . aspirin 81 MG EC tablet Take 1 tablet (81 mg total) by mouth daily.  . Calcium Carbonate (CALTRATE 600 PO) Take by mouth every morning.  . carvedilol (COREG) 12.5 MG tablet TAKE 1 TABLET BY MOUTH TWICE A DAY WITH A MEAL  . Coenzyme Q10 (CO Q 10) 100 MG CAPS Take 100 mg by mouth at bedtime.   . Fish Oil OIL Take 227 mcg by mouth 2 (two) times daily. GUMMIES  . losartan (COZAAR) 50 MG tablet TAKE 0.5 TABLETS (25 MG TOTAL) BY MOUTH DAILY.  . multivitamin (THERAGRAN) per tablet Take 1 tablet by mouth daily.   . polyvinyl alcohol (LIQUIFILM TEARS) 1.4 % ophthalmic solution Place 1 drop into both eyes every morning.  . potassium chloride SA (KLOR-CON M20) 20 MEQ tablet Take 1 tablet (20 mEq total) by mouth 2 (two) times a day.  . simvastatin (ZOCOR) 20 MG tablet TAKE 1 TABLET BY MOUTH EVERYDAY AT BEDTIME  . furosemide  (LASIX) 40 MG tablet Take 40 mg by mouth as needed.   No facility-administered encounter medications on file as of 04/10/2019.  Activities of Daily Living In your present state of health, do you have any difficulty performing the following activities: 04/10/2019  Hearing? N  Vision? N  Difficulty concentrating or making decisions? N  Walking or climbing stairs? N  Dressing or bathing? N  Doing errands, shopping? N  Preparing Food and eating ? N  Using the Toilet? N  In the past six months, have you accidently leaked urine? N  Do you have problems with loss of bowel control? N  Managing your Medications? N  Managing your Finances? N  Housekeeping or managing your Housekeeping? N  Some recent data might be hidden    Patient Care Team: Leone Haven, MD as PCP - General (Family Medicine) [name], MD as PCP - Sleep Medicine (Sleep Medicine) Dasher, Rayvon Char, MD (Dermatology) Juanito Doom, MD as Referring Physician (Internal Medicine)    Assessment:   This is a routine wellness examination for Ken[place].  I connected with patient 04/10/19 at  9:30 AM EDT by an audio enabled telemedicine application and verified that I am speaking with the correct person using two identifiers. Patient stated full name and DOB. Patient gave permission to continue with virtual visit. Patient's location was at home and Nurse's location was at [place] office.   The roster of all physicians providing medical care to patient is listed in the Snapshot section of the chart.  Health Maintenance Due: Influenza vaccine 2020- discussed; to be completed in season with doctor or local pharmacy. Update as appropriate.  Eye Exam- scheduled 03/2019 Foot Exam- followed by pcp Hgb A1c- 12/02/2017 (6.2) See completed HM at the end of note.  Eye- Visual acuity not assessed. Virtual visit. Wears corrective lenses. Followed by their ophthalmologist every 12 months. No retinopathy reported.  Dental-  every 6 months.    Hearing-demonstrates normal hearing during visit.  Safety  Patient feels safe at home- yes Patient does have smoke detectors at home- yes Patient does wear sunscreen or protective clothing when in direct sunlight - yes Patient does wear seat belt when in a moving vehicle - yes Patient drives- yes Adequate lighting in walkways free from debris- yes Grab bars and handrails used as appropriate- yes Ambulates with no devices.  Social  Alcohol intake - no         Smoking history- never    Smokers in home? none Illicit drug use? None  Covid-19 precautions and sickness symptoms discussed. Wears mask as appropriate.   Depression Screen Depression- PHQ 2 &9 complete.  There are no signs/symptoms or verbal communication regarding depression, irritability, anhedonia, sadness/tearfullness.   Activities of Daily Living Patient denies needing assistance with: household chores, feeding themselves, getting from bed to chair, getting to the toilet, bathing/showering, dressing, managing money, or preparing meals.  No failures at ADL's or IADL's.   Memory: Patient is alert. Patient denies difficulty focusing or concentrating. Correctly identified the president of the [place], season and recall 5/5. Patient likes to read and complete puzzles for brain stimulation.  BMI- discussed the importance of a healthy diet, water intake and the benefits of aerobic exercise.  Educational material provided.  Physical activity- no routine. Exercise encouraged.   Diet: regular. Low carb encouraged.  Water: good intake Caffeine: very limited Fluid restriction- 2 liter daily Ensure/Protein supplement: none  Immunizations The following Immunizations were discussed: Influenza, shingles, pneumonia, and tetanus.   Advanced Directive End of life planning; Advanced aging; Advanced directives discussed.  No HCPOA/Living Will.  Additional information declined at  this time.  Other Providers  Patient Care Team: Leone Haven, MD as PCP - General (Family Medicine) [name], MD as PCP - Sleep Medicine (Sleep Medicine) Dasher, Rayvon Char, MD (Dermatology) Juanito Doom, MD as Referring Physician (Internal Medicine)  Exercise Activities and Dietary recommendations Current Exercise Habits: The patient does not participate in regular exercise at present  Goals      Patient Stated   . Increase physical activity (pt-stated)     Chair exercises          Fall Risk Fall Risk  04/10/2019 04/08/2018 03/28/2017 11/13/2016 03/28/2016  Falls in the past year? 0 No No No No   Depression Screen PHQ 2/9 Scores 04/10/2019 04/08/2018 03/28/2017 11/13/2016  PHQ - 2 Score 0 0 0 0  PHQ- 9 Score - - 0 2     Cognitive Function MMSE - Mini Mental State Exam 04/08/2018 03/28/2017 03/28/2016  Orientation to time 5 5 5   Orientation to Place 5 5 5   Registration 3 3 3   Attention/ Calculation 5 5 5   Recall 3 3 3   Language- name 2 objects 2 2 2   Language- repeat 1 1 1   Language- follow 3 step command 3 3 3   Language- read & follow direction 1 1 1   Write a sentence 1 1 1   Copy design 1 1 1   Total score 30 30 30      6CIT Screen 04/10/2019  What Year? 0 points  What month? 0 points  What time? 0 points  Count back from 20 0 points  Months in reverse 0 points  Repeat phrase 0 points  Total Score 0    Immunization History  Administered Date(s) Administered  . H1N1 09/08/2008  . Influenza Split 06/28/2011, 05/01/2012  . Influenza Whole 07/11/2007, 06/07/2008, 07/12/2009  . Influenza,inj,Quad PF,6+ Mos 04/23/2014, 04/21/2015  . Influenza-Unspecified 04/27/2013, 05/04/2016, 04/24/2017  . Pneumococcal Conjugate-13 01/21/2014  . Pneumococcal Polysaccharide-23 08/28/2011, 03/06/2012  . Td 10/03/2007, 10/14/2013  . Tdap 10/11/2013  . Zoster 06/21/2010    Screening Tests Health Maintenance  Topic Date Due  . HEMOGLOBIN A1C  06/03/2018  . FOOT EXAM  11/16/2018  . INFLUENZA VACCINE   03/28/2019  . OPHTHALMOLOGY EXAM  05/23/2019  . COLONOSCOPY  12/20/2019  . MAMMOGRAM  03/02/2021  . TETANUS/TDAP  10/15/2023  . DEXA SCAN  Completed  . Hepatitis C Screening  Completed  . PNA vac Low Risk Adult  Completed      Plan:   Follow up with pcp as directed and needed.  Keep all routine maintenance appointments.  Chair exercises with leg and arm lifts as discussed. Try to ride the stationary bike.  I have personally reviewed and noted the following in the patient's chart:   . Medical and social history . Use of alcohol, tobacco or illicit drugs  . Current medications and supplements . Functional ability and status . Nutritional status . Physical activity . Advanced directives . List of other physicians . Screenings to include cognitive, depression, and falls . Referrals and appointments  In addition, I have reviewed and discussed with patient certain preventive protocols, quality metrics, and best practice recommendations. A written personalized care plan for preventive services as well as general preventive health recommendations were provided to patient.     Varney Biles, LPN  [id number]

## 2019-04-10 NOTE — Patient Instructions (Addendum)
  Ms. Bulls , Thank you for taking time to come for your Medicare Wellness Visit. I appreciate your ongoing commitment to your health goals. Please review the following plan we discussed and let me know if I can assist you in the future.   These are the goals we discussed: Goals      Patient Stated   . Increase physical activity (pt-stated)     Chair exercises          This is a list of the screening recommended for you and due dates:  Health Maintenance  Topic Date Due  . Hemoglobin A1C  06/03/2018  . Complete foot exam   11/16/2018  . Flu Shot  03/28/2019  . Eye exam for diabetics  05/23/2019  . Colon Cancer Screening  12/20/2019  . Mammogram  03/02/2021  . Tetanus Vaccine  10/15/2023  . DEXA scan (bone density measurement)  Completed  .  Hepatitis C: One time screening is recommended by Center for Disease Control  (CDC) for  adults born from [birth year] through 1964.   Completed  . Pneumonia vaccines  Completed

## 2019-04-10 NOTE — Assessment & Plan Note (Signed)
Chronic issue with flatulence and burping.  We will have her look at the FODMAP diet and see if she can make any changes there for this.

## 2019-04-10 NOTE — Patient Instructions (Signed)
Low-FODMAP Eating Plan  FODMAPs (fermentable oligosaccharides, disaccharides, monosaccharides, and polyols) are sugars that are hard for some people to digest. A low-FODMAP eating plan may help some people who have bowel (intestinal) diseases to manage their symptoms. This meal plan can be complicated to follow. Work with a diet and nutrition specialist (dietitian) to make a low-FODMAP eating plan that is right for you. A dietitian can make sure that you get enough nutrition from this diet. What are tips for following this plan? Reading food labels  Check labels for hidden FODMAPs such as: ? High-fructose syrup. ? Honey. ? Agave. ? Natural fruit flavors. ? Onion or garlic powder.  Choose low-FODMAP foods that contain 3-4 grams of fiber per serving.  Check food labels for serving sizes. Eat only one serving at a time to make sure FODMAP levels stay low. Meal planning  Follow a low-FODMAP eating plan for up to 6 weeks, or as told by your health care provider or dietitian.  To follow the eating plan: 1. Eliminate high-FODMAP foods from your diet completely. 2. Gradually reintroduce high-FODMAP foods into your diet one at a time. Most people should wait a few days after introducing one high-FODMAP food before they introduce the next high-FODMAP food. Your dietitian can recommend how quickly you may reintroduce foods. 3. Keep a daily record of what you eat and drink, and make note of any symptoms that you have after eating. 4. Review your daily record with a dietitian regularly. Your dietitian can help you identify which foods you can eat and which foods you should avoid. General tips  Drink enough fluid each day to keep your urine pale yellow.  Avoid processed foods. These often have added sugar and may be high in FODMAPs.  Avoid most dairy products, whole grains, and sweeteners.  Work with a dietitian to make sure you get enough fiber in your diet. Recommended foods Grains   Gluten-free grains, such as rice, oats, buckwheat, quinoa, corn, polenta, and millet. Gluten-free pasta, bread, or cereal. Rice noodles. Corn tortillas. Vegetables  Eggplant, zucchini, cucumber, peppers, green beans, Brussels sprouts, bean sprouts, lettuce, arugula, kale, Swiss chard, spinach, collard greens, bok choy, summer squash, potato, and tomato. Limited amounts of corn, carrot, and sweet potato. Green parts of scallions. Fruits  Bananas, oranges, lemons, limes, blueberries, raspberries, strawberries, grapes, cantaloupe, honeydew melon, kiwi, papaya, passion fruit, and pineapple. Limited amounts of dried cranberries, banana chips, and shredded coconut. Dairy  Lactose-free milk, yogurt, and kefir. Lactose-free cottage cheese and ice cream. Non-dairy milks, such as almond, coconut, hemp, and rice milk. Yogurts made of non-dairy milks. Limited amounts of goat cheese, brie, mozzarella, parmesan, swiss, and other hard cheeses. Meats and other protein foods  Unseasoned beef, pork, poultry, or fish. Eggs. Bacon. Tofu (firm) and tempeh. Limited amounts of nuts and seeds, such as almonds, walnuts, brazil nuts, pecans, peanuts, pumpkin seeds, chia seeds, and sunflower seeds. Fats and oils  Butter-free spreads. Vegetable oils, such as olive, canola, and sunflower oil. Seasoning and other foods  Artificial sweeteners with names that do not end in "ol" such as aspartame, saccharine, and stevia. Maple syrup, white table sugar, raw sugar, brown sugar, and molasses. Fresh basil, coriander, parsley, rosemary, and thyme. Beverages  Water and mineral water. Sugar-sweetened soft drinks. Small amounts of orange juice or cranberry juice. Black and green tea. Most dry wines. Coffee. This may not be a complete list of low-FODMAP foods. Talk with your dietitian for more information. Foods to avoid Grains  Wheat,   including kamut, durum, and semolina. Barley and bulgur. Couscous. Wheat-based cereals. Wheat  noodles, bread, crackers, and pastries. Vegetables  Chicory root, artichoke, asparagus, cabbage, snow peas, sugar snap peas, mushrooms, and cauliflower. Onions, garlic, leeks, and the white part of scallions. Fruits  Fresh, dried, and juiced forms of apple, pear, watermelon, peach, plum, cherries, apricots, blackberries, boysenberries, figs, nectarines, and mango. Avocado. Dairy  Milk, yogurt, ice cream, and soft cheese. Cream and sour cream. Milk-based sauces. Custard. Meats and other protein foods  Fried or fatty meat. Sausage. Cashews and pistachios. Soybeans, baked beans, black beans, chickpeas, kidney beans, fava beans, navy beans, lentils, and split peas. Seasoning and other foods  Any sugar-free gum or candy. Foods that contain artificial sweeteners such as sorbitol, mannitol, isomalt, or xylitol. Foods that contain honey, high-fructose corn syrup, or agave. Bouillon, vegetable stock, beef stock, and chicken stock. Garlic and onion powder. Condiments made with onion, such as hummus, chutney, pickles, relish, salad dressing, and salsa. Tomato paste. Beverages  Chicory-based drinks. Coffee substitutes. Chamomile tea. Fennel tea. Sweet or fortified wines such as port or sherry. Diet soft drinks made with isomalt, mannitol, maltitol, sorbitol, or xylitol. Apple, pear, and mango juice. Juices with high-fructose corn syrup. This may not be a complete list of high-FODMAP foods. Talk with your dietitian to discuss what dietary choices are best for you.  Summary  A low-FODMAP eating plan is a short-term diet that eliminates FODMAPs from your diet to help ease symptoms of certain bowel diseases.  The eating plan usually lasts up to 6 weeks. After that, high-FODMAP foods are restarted gradually, one at a time, so you can find out which may be causing symptoms.  A low-FODMAP eating plan can be complicated. It is best to work with a dietitian who has experience with this type of plan. This  information is not intended to replace advice given to you by your health care provider. Make sure you discuss any questions you have with your health care provider. Document Released: 04/09/2017 Document Revised: 07/26/2017 Document Reviewed: 04/09/2017 Elsevier Patient Education  2020 [employer]  

## 2019-04-10 NOTE — Progress Notes (Signed)
Virtual Visit via telephone Note  This visit type was conducted due to national recommendations for restrictions regarding the COVID-19 pandemic (e.g. social distancing).  This format is felt to be most appropriate for this patient at this time.  All issues noted in this document were discussed and addressed.  No physical exam was performed (except for noted visual exam findings with Video Visits).   I connected with Betty [name] today at 10:00 AM EDT by telephone and verified that I am speaking with the correct person using two identifiers. Location patient: home Location provider: work Persons participating in the virtual visit: patient, provider  I discussed the limitations, risks, security and privacy concerns of performing an evaluation and management service by telephone and the availability of in person appointments. I also discussed with the patient that there may be a patient responsible charge related to this service. The patient expressed understanding and agreed to proceed.  Interactive audio and video telecommunications were attempted between this provider and patient, however failed, due to patient having technical difficulties OR patient did not have access to video capability.  We continued and completed visit with audio only.  Reason for visit: follow-up  HPI: Diastolic heart failure: Blood pressures have been around 117/60.  She is taking carvedilol and losartan.  She only takes Lasix as needed and she has not been taking it recently.  No chest pain.  She has chronic stable dyspnea related to pulmonary issues.  Minimal edema in her ankles at this time.  No orthopnea or PND.  She limits fluid intake to 2 L daily.  Hyperlipidemia: Taking simvastatin.  No right upper quadrant pain or myalgias.  Diabetes: Not on medication.  No polyuria or polydipsia.  Flatulence/burping: Patient notes he produces lots of gas.  No diarrhea or loose stools.  No abdominal pain.  Does not seem  to be tied anything she is eating.   ROS: See pertinent positives and negatives per HPI.  Past Medical History:  Diagnosis Date  . Allergic rhinitis    never tested, fall and spring  . Arthritis    hands,knees, feet  . Cataracts, bilateral    not surgical yet  . CHOLECYSTECTOMY, HX OF 10/08/2007   Qualifier: Diagnosis of  By: Council Mechanic MD, [name]   . Chronic diastolic CHF (congestive heart failure) ([place])   . Chronic respiratory failure ([place])   . Diabetes mellitus without complication ([place])   . Diverticulosis    problems with frequent gas, burping  . Glucose intolerance (impaired glucose tolerance)   . Gout   . History of chicken pox   . Hyperlipidemia   . Hypertension   . NICM (nonischemic cardiomyopathy) ([place]) 2002   EF 25%; improved to normal - echo 4/08: EF 50-55%, mild MR, mild LAE, mild TR;    cath 3/03: normal cors, EF 40%  . Obesity   . PONV (postoperative nausea and vomiting)   . Restrictive lung disease   . Skin cancer    skin cancers only  . Sleep apnea    cpap settings 4    Past Surgical History:  Procedure Laterality Date  . BILATERAL VATS ABLATION Right    biopsy done 2015- [facility]  . BREAST EXCISIONAL BIOPSY Left 80's   NEG  . CARDIAC CATHETERIZATION    . choleystectomy  02/2002  . COLONOSCOPY WITH PROPOFOL N/A 12/20/2014   Procedure: COLONOSCOPY WITH PROPOFOL;  Surgeon: [name], MD;  Location: [facility];  Service: Gastroenterology;  Laterality: N/A;  .  CYSTECTOMY  1996   l breast  . DILATION AND CURETTAGE OF UTERUS  2011   Dr. Hulan Fray at [facility]  . [place]    . nsvd     x 2  . TUBAL LIGATION  1975  . VAGINAL DELIVERY     x2    Family History  Problem Relation Age of Onset  . Lymphoma Mother   . Cancer Mother 55       Lymphoma  . COPD Father        was a smoker  . Stroke Father   . Pneumonia Father   . Diabetes Father   . Hyperlipidemia Father   . Heart disease Father   . Heart attack Father   . Hypertension  Father   . Diabetes Sister   . Depression Sister   . Meniere's disease Brother   . Diabetes Paternal Grandfather   . Heart disease Paternal Grandfather   . Schizophrenia Daughter   . Cancer Maternal Grandmother        Bladder  . Cancer Maternal Grandfather        leukemia  . Hypertension Brother   . Colon cancer Neg Hx   . Stomach cancer Neg Hx   . Breast cancer Neg Hx     SOCIAL HX: Non-smoker.   Current Outpatient Medications:  .  aspirin 81 MG EC tablet, Take 1 tablet (81 mg total) by mouth daily., Disp: 90 tablet, Rfl: 2 .  Calcium Carbonate (CALTRATE 600 PO), Take by mouth every morning., Disp: , Rfl:  .  carvedilol (COREG) 12.5 MG tablet, TAKE 1 TABLET BY MOUTH TWICE A DAY WITH A MEAL, Disp: 180 tablet, Rfl: 2 .  Coenzyme Q10 (CO Q 10) 100 MG CAPS, Take 100 mg by mouth at bedtime. , Disp: , Rfl:  .  Fish Oil OIL, Take 227 mcg by mouth 2 (two) times daily. GUMMIES, Disp: , Rfl:  .  furosemide (LASIX) 40 MG tablet, Take 40 mg by mouth as needed., Disp: , Rfl:  .  losartan (COZAAR) 50 MG tablet, TAKE 0.5 TABLETS (25 MG TOTAL) BY MOUTH DAILY., Disp: 45 tablet, Rfl: 3 .  multivitamin (THERAGRAN) per tablet, Take 1 tablet by mouth daily. , Disp: , Rfl:  .  polyvinyl alcohol (LIQUIFILM TEARS) 1.4 % ophthalmic solution, Place 1 drop into both eyes every morning., Disp: , Rfl:  .  potassium chloride SA (KLOR-CON M20) 20 MEQ tablet, Take 1 tablet (20 mEq total) by mouth 2 (two) times a day., Disp: 270 tablet, Rfl: 1 .  simvastatin (ZOCOR) 20 MG tablet, TAKE 1 TABLET BY MOUTH EVERYDAY AT BEDTIME, Disp: 90 tablet, Rfl: 3  EXAM: This is a telehealth telephone visit notes no physical exam was completed.  ASSESSMENT AND PLAN:  Discussed the following assessment and plan:  Chronic diastolic heart failure (HCC) Mild edema.  I encouraged her to take a dose of her Lasix when she has edema.  She will continue her other regimen and continue to follow-up with cardiology.  Diabetes mellitus  type 2, controlled ([place]) Plan to check A1c.  HYPERLIPIDEMIA, MIXED Continue simvastatin.  Check lipids.  Flatulence Chronic issue with flatulence and burping.  We will have her look at the FODMAP diet and see if she can make any changes there for this.    Social distancing precautions and sick precautions given regarding COVID-19.  I discussed the assessment and treatment plan with the patient. The patient was provided an opportunity to ask questions  and all were answered. The patient agreed with the plan and demonstrated an understanding of the instructions.   The patient was advised to call back or seek an in-person evaluation if the symptoms worsen or if the condition fails to improve as anticipated.  I provided 10 minutes of non-face-to-face time during this encounter.   Tommi Rumps, MD

## 2019-04-10 NOTE — Assessment & Plan Note (Signed)
Mild edema.  I encouraged her to take a dose of her Lasix when she has edema.  She will continue her other regimen and continue to follow-up with cardiology.

## 2019-04-11 NOTE — Progress Notes (Signed)
I have reviewed the above note and agree.  Brieana Shimmin, M.D.  

## 2019-04-12 ENCOUNTER — Other Ambulatory Visit (HOSPITAL_COMMUNITY): Payer: Self-pay | Admitting: Student

## 2019-04-30 ENCOUNTER — Other Ambulatory Visit (INDEPENDENT_AMBULATORY_CARE_PROVIDER_SITE_OTHER): Payer: Medicare Other

## 2019-04-30 ENCOUNTER — Other Ambulatory Visit: Payer: Self-pay

## 2019-04-30 DIAGNOSIS — Z23 Encounter for immunization: Secondary | ICD-10-CM | POA: Diagnosis not present

## 2019-04-30 DIAGNOSIS — E1121 Type 2 diabetes mellitus with diabetic nephropathy: Secondary | ICD-10-CM

## 2019-04-30 DIAGNOSIS — E782 Mixed hyperlipidemia: Secondary | ICD-10-CM | POA: Diagnosis not present

## 2019-04-30 LAB — HEPATIC FUNCTION PANEL
ALT: 19 U/L (ref 0–35)
AST: 19 U/L (ref 0–37)
Albumin: 4.1 g/dL (ref 3.5–5.2)
Alkaline Phosphatase: 68 U/L (ref 39–117)
Bilirubin, Direct: 0.1 mg/dL (ref 0.0–0.3)
Total Bilirubin: 0.5 mg/dL (ref 0.2–1.2)
Total Protein: 7.2 g/dL (ref 6.0–8.3)

## 2019-04-30 LAB — LIPID PANEL
Cholesterol: 169 mg/dL (ref 0–200)
HDL: 69.7 mg/dL (ref >39.00)
NonHDL: 99.26
Total CHOL/HDL Ratio: 2
Triglycerides: 255 mg/dL — ABNORMAL HIGH (ref 0.0–149.0)
VLDL: 51 mg/dL — ABNORMAL HIGH (ref 0.0–40.0)

## 2019-04-30 LAB — LDL CHOLESTEROL, DIRECT: Direct LDL: 71 mg/dL

## 2019-04-30 LAB — HEMOGLOBIN A1C: Hgb A1c MFr Bld: 7.2 % — ABNORMAL HIGH (ref 4.6–6.5)

## 2019-05-09 ENCOUNTER — Other Ambulatory Visit: Payer: Self-pay | Admitting: Family Medicine

## 2019-05-09 DIAGNOSIS — E782 Mixed hyperlipidemia: Secondary | ICD-10-CM

## 2019-05-09 MED ORDER — EMPAGLIFLOZIN 10 MG PO TABS: 10.0000 mg | ORAL_TABLET | Freq: Every day | ORAL | 1 refills | Status: DC

## 2019-05-09 MED ORDER — ROSUVASTATIN CALCIUM 40 MG PO TABS: 40.0000 mg | ORAL_TABLET | Freq: Every day | ORAL | 3 refills | Status: DC

## 2019-05-15 ENCOUNTER — Encounter: Payer: Self-pay | Admitting: Family Medicine

## 2019-05-18 ENCOUNTER — Encounter: Payer: Self-pay | Admitting: Family Medicine

## 2019-05-18 LAB — HM DIABETES EYE EXAM

## 2019-05-18 MED ORDER — EMPAGLIFLOZIN 10 MG PO TABS: 10.0000 mg | ORAL_TABLET | Freq: Every day | ORAL | 1 refills | Status: DC

## 2019-06-27 ENCOUNTER — Encounter: Payer: Self-pay | Admitting: Family Medicine

## 2019-06-30 NOTE — Telephone Encounter (Signed)
Per Dr. Kerrie Buffalo this patient was scheduled to see L. guse about her vaginal discharge.  Renso Swett,cma

## 2019-07-01 ENCOUNTER — Other Ambulatory Visit: Payer: Self-pay

## 2019-07-02 ENCOUNTER — Other Ambulatory Visit (HOSPITAL_COMMUNITY)
Admission: RE | Admit: 2019-07-02 | Discharge: 2019-07-02 | Disposition: A | Discharge status: Home / Self Care | Payer: Medicare Other | Source: Ambulatory Visit | Attending: Family Medicine | Admitting: Family Medicine

## 2019-07-02 ENCOUNTER — Ambulatory Visit (INDEPENDENT_AMBULATORY_CARE_PROVIDER_SITE_OTHER): Payer: Medicare Other | Admitting: Family Medicine

## 2019-07-02 ENCOUNTER — Encounter: Payer: Self-pay | Admitting: Family Medicine

## 2019-07-02 VITALS — BP 124/74 | HR 88 | Temp 97.6°F | Wt 204.2 lb

## 2019-07-02 DIAGNOSIS — N898 Other specified noninflammatory disorders of vagina: Secondary | ICD-10-CM

## 2019-07-02 DIAGNOSIS — Z1151 Encounter for screening for human papillomavirus (HPV): Secondary | ICD-10-CM | POA: Diagnosis not present

## 2019-07-02 DIAGNOSIS — N760 Acute vaginitis: Secondary | ICD-10-CM

## 2019-07-02 DIAGNOSIS — N950 Postmenopausal bleeding: Secondary | ICD-10-CM | POA: Insufficient documentation

## 2019-07-02 DIAGNOSIS — B9689 Other specified bacterial agents as the cause of diseases classified elsewhere: Secondary | ICD-10-CM

## 2019-07-02 NOTE — Progress Notes (Signed)
Subjective:    Patient ID: Betty [name], female    DOB: February 22, 1946, 73 y.o.   MRN: [id number]  HPI   Patient presents to clinic complaining of some vaginal discharge and also vaginal bleeding.  Patient thinks the vaginal discharge is related to her Vania Rea, stopped the Jardiance and the discharge and irritation in the vaginal area seems somewhat better.  Patient states she has had some brownish blood looking discharge from vagina for about a month, and last week it looked more red as if a light period was happening.  Patient is postmenopausal.  About 8 years ago she did have to have a D&C of endometrium of uterus due to endometrium being thicker than expected.  Patient denies any possible exposure to STDs.  Patient Active Problem List   Diagnosis Date Noted  . Flatulence 04/10/2019  . Restrictive lung disease 08/06/2016  . Left-sided low back pain with left-sided sciatica 05/01/2016  . History of colonic polyps   . Benign neoplasm of ascending colon   . Benign neoplasm of transverse colon   . Rash and nonspecific skin eruption 08/30/2014  . Postmenopausal estrogen deficiency 08/30/2014  . Left Achilles tendinitis 04/23/2014  . Chronic diastolic heart failure ([place]) 04/08/2014  . Obesity (BMI 30-39.9) 04/09/2013  . Diabetes mellitus type 2, controlled ([place]) 01/07/2013  . Edema 01/07/2013  . Chronic hypoxemic respiratory failure ([place]) 11/11/2012  . Obstructive sleep apnea 11/11/2012  . Dyspnea 08/18/2012  . Fatigue 08/18/2012  . Meralgia paresthetica of left side 08/20/2011  . Myalgia 05/03/2011  . Leg pain, lateral 04/12/2011  . GOUT 12/26/2009  . CARDIOMYOPATHY 10/08/2007  . ALLERGIC RHINITIS, SEASONAL 10/08/2007  . HYPERLIPIDEMIA, MIXED 05/02/2007   Social History   Tobacco Use  . Smoking status: Never Smoker  . Smokeless tobacco: Never Used  Substance Use Topics  . Alcohol use: No    Alcohol/week: 0.0 standard drinks   Review of Systems  Constitutional:  Negative for chills, fatigue and fever.  HENT: Negative for congestion, ear pain, sinus pain and sore throat.   Eyes: Negative.   Respiratory: Negative for cough, shortness of breath and wheezing.   Cardiovascular: Negative for chest pain, palpitations and leg swelling.  Gastrointestinal: Negative for abdominal pain, diarrhea, nausea and vomiting.  Genitourinary: Negative for dysuria, frequency and urgency. +vaginal discharge/vaginal bleed  Musculoskeletal: Negative for arthralgias and myalgias.  Skin: Negative for color change, pallor and rash.  Neurological: Negative for syncope, light-headedness and headaches.  Psychiatric/Behavioral: The patient is not nervous/anxious.       Objective:   Physical Exam Vitals signs and nursing note reviewed.  Constitutional:      General: She is not in acute distress.    Appearance: She is obese. She is not toxic-appearing.  HENT:     Head: Normocephalic and atraumatic.  Cardiovascular:     Rate and Rhythm: Normal rate and regular rhythm.     Heart sounds: Normal heart sounds.  Pulmonary:     Effort: Pulmonary effort is normal. No respiratory distress.  Abdominal:     General: Bowel sounds are normal. There is no distension.     Palpations: Abdomen is soft.     Tenderness: There is no abdominal tenderness. There is no guarding.  Genitourinary:    Labia:        Right: Tenderness present.        Left: Tenderness present.      Vagina: Vaginal discharge (small amount of thin white discharge) and bleeding (older brown-colored  blood) present.     Cervix: Cervical bleeding (after pap collected) present.     Uterus: Not tender.      Adnexa:        Right: No tenderness.         Left: No tenderness.       Comments: Labial skin appears somewhat red/irritated, dry Some discharge in vagina Pap collected, vaginal swab collected. When app collected from cervix, cervix began to bleed Skin:    General: Skin is warm and dry.     Coloration: Skin is not  jaundiced or pale.  Neurological:     Mental Status: She is alert and oriented to person, place, and time.     Gait: Gait normal.  Psychiatric:        Mood and Affect: Mood normal.        Behavior: Behavior normal.    Today's Vitals   07/02/19 0913  BP: 124/74  Pulse: 88  Temp: 97.6 F (36.4 C)  SpO2: 96%  Weight: 204 lb 3.2 oz (92.6 kg)   Body mass index is 33.98 kg/m.     Assessment & Plan:    Vaginal discharge/postmenopausal bleeding-Pap smear collected and sent to lab, vaginal swab also collected and sent to lab.  We will place urgent referral to GYN and get transvaginal ultrasound as quickly as possible.  We want to be sure and get to the bottom of presents for vaginal discharge or postmenopausal bleeding quickly.  Patient also advised she can use a water-based lubricant on the labial skin to help combat dryness.  Advised at this time I would hold off on starting any start of hormone creams due to unknown reasons for vaginal discharge/postmenopausal bleeding.  Advised patient I do believe that discharge could be related to Jardiance, and stopping this medicine will be a good trial to see if the discharge itself improves.  Advised patient I am especially concerned about the postmenopausal bleeding and this is why I want her to see GYN right away and get ultrasound as soon as possible.  Patient verbalizes understanding of plan.  She is aware she will be contacted about ultrasound and referral soon, advised she is not hearing by early next week to call us and let us know.

## 2019-07-03 LAB — CERVICOVAGINAL ANCILLARY ONLY
Bacterial Vaginitis (gardnerella): POSITIVE — AB
Candida Glabrata: NEGATIVE
Candida Vaginitis: NEGATIVE
Comment: NEGATIVE
Comment: NEGATIVE
Comment: NEGATIVE

## 2019-07-03 MED ORDER — METRONIDAZOLE 0.75 % VA GEL: 1.0000 | Freq: Every day | VAGINAL | 0 refills | Status: AC

## 2019-07-03 NOTE — Addendum Note (Signed)
Addended by: Philis Nettle on: 07/03/2019 01:01 PM   Modules accepted: Orders

## 2019-07-07 LAB — CYTOLOGY - PAP
Chlamydia: NEGATIVE
Comment: NEGATIVE
Comment: NEGATIVE
Comment: NEGATIVE
Comment: NEGATIVE
Comment: NORMAL
Diagnosis: NEGATIVE
HSV1: NEGATIVE
HSV2: NEGATIVE
High risk HPV: NEGATIVE
Neisseria Gonorrhea: NEGATIVE
Trichomonas: NEGATIVE

## 2019-07-08 ENCOUNTER — Encounter: Payer: Self-pay | Admitting: Obstetrics and Gynecology

## 2019-07-08 ENCOUNTER — Other Ambulatory Visit: Payer: Self-pay

## 2019-07-08 ENCOUNTER — Ambulatory Visit (INDEPENDENT_AMBULATORY_CARE_PROVIDER_SITE_OTHER): Payer: Medicare Other | Admitting: Obstetrics and Gynecology

## 2019-07-08 VITALS — BP 117/71 | HR 86 | Ht 65.0 in | Wt 203.7 lb

## 2019-07-08 DIAGNOSIS — N950 Postmenopausal bleeding: Secondary | ICD-10-CM | POA: Diagnosis not present

## 2019-07-08 NOTE — Progress Notes (Signed)
HPI:      [name] is a 73 y.o. No obstetric history on file. who LMP was No LMP recorded. Patient is postmenopausal.  Subjective:   She presents today with a history of postmenopausal bleeding.  She states that she had bleeding approximately a year ago and then last week.  She describes it as light. She also reports that approximately 8 years ago she underwent a D&C for postmenopausal bleeding which showed a "thickened endometrium" but she did not need to take medication or have any further follow-up from this. She is currently receiving treatment (metronidazole) for bacterial vaginosis.    Hx: The following portions of the patient's history were reviewed and updated as appropriate:             She  has a past medical history of Allergic rhinitis, Arthritis, Cataracts, bilateral, CHOLECYSTECTOMY, HX OF (10/08/2007), Chronic diastolic CHF (congestive heart failure) ([place]), Chronic respiratory failure ([place]), Diabetes mellitus without complication ([place]), Diverticulosis, Glucose intolerance (impaired glucose tolerance), Gout, History of chicken pox, Hyperlipidemia, Hypertension, NICM (nonischemic cardiomyopathy) ([place]) (2002), Obesity, PONV (postoperative nausea and vomiting), Restrictive lung disease, Skin cancer, and Sleep apnea. She does not have any pertinent problems on file. She  has a past surgical history that includes nsvd; Cystectomy (1996); choleystectomy (02/2002); Vaginal delivery; Tubal ligation (1975); Dilation and curettage of uterus (2011); Induced abortion; Cardiac catheterization; Bilateral VATS ablation (Right); Colonoscopy with propofol (N/A, 12/20/2014); and Breast excisional biopsy (Left, 80's). Her family history includes COPD in her father; Cancer in her maternal grandfather and maternal grandmother; Cancer (age of onset: 1) in her mother; Depression in her sister; Diabetes in her father, paternal grandfather, and sister; Heart attack in her father; Heart disease in her  father and paternal grandfather; Hyperlipidemia in her father; Hypertension in her brother and father; Lymphoma in her mother; Meniere's disease in her brother; Pneumonia in her father; Schizophrenia in her daughter; Stroke in her father. She  reports that she has never smoked. She has never used smokeless tobacco. She reports that she does not drink alcohol or use drugs. She has a current medication list which includes the following prescription(s): aspirin, calcium carbonate, carvedilol, co q 10, empagliflozin, fish oil, furosemide, losartan, metronidazole, multivitamin, polyvinyl alcohol, potassium chloride sa, and rosuvastatin. She is allergic to amoxicillin; etodolac; hydrochlorothiazide; meloxicam; sulfa antibiotics; allopurinol; metformin and related; and sulfonamide derivatives.       Review of Systems:  Review of Systems  Constitutional: Denied constitutional symptoms, night sweats, recent illness, fatigue, fever, insomnia and weight loss.  Eyes: Denied eye symptoms, eye pain, photophobia, vision change and visual disturbance.  Ears/Nose/Throat/Neck: Denied ear, nose, throat or neck symptoms, hearing loss, nasal discharge, sinus congestion and sore throat.  Cardiovascular: Denied cardiovascular symptoms, arrhythmia, chest pain/pressure, edema, exercise intolerance, orthopnea and palpitations.  Respiratory: Denied pulmonary symptoms, asthma, pleuritic pain, productive sputum, cough, dyspnea and wheezing.  Gastrointestinal: Denied, gastro-esophageal reflux, melena, nausea and vomiting.  Genitourinary: See HPI for additional information.  Musculoskeletal: Denied musculoskeletal symptoms, stiffness, swelling, muscle weakness and myalgia.  Dermatologic: Denied dermatology symptoms, rash and scar.  Neurologic: Denied neurology symptoms, dizziness, headache, neck pain and syncope.  Psychiatric: Denied psychiatric symptoms, anxiety and depression.  Endocrine: Denied endocrine symptoms including  hot flashes and night sweats.   Meds:   Current Outpatient Medications on File Prior to Visit  Medication Sig Dispense Refill  . aspirin 81 MG EC tablet Take 1 tablet (81 mg total) by mouth daily. 90 tablet 2  .  Calcium Carbonate (CALTRATE 600 PO) Take by mouth every morning.    . carvedilol (COREG) 12.5 MG tablet TAKE 1 TABLET BY MOUTH TWICE A DAY WITH MEALS 180 tablet 2  . Coenzyme Q10 (CO Q 10) 100 MG CAPS Take 100 mg by mouth at bedtime.     . empagliflozin (JARDIANCE) 10 MG TABS tablet Take 10 mg by mouth daily before breakfast. 90 tablet 1  . Fish Oil OIL Take 227 mcg by mouth 2 (two) times daily. GUMMIES    . furosemide (LASIX) 40 MG tablet Take 40 mg by mouth as needed.    Marland Kitchen losartan (COZAAR) 50 MG tablet TAKE 0.5 TABLETS (25 MG TOTAL) BY MOUTH DAILY. 45 tablet 3  . metroNIDAZOLE (METROGEL VAGINAL) 0.75 % vaginal gel Place 1 Applicatorful vaginally at bedtime for 5 days. 70 g 0  . multivitamin (THERAGRAN) per tablet Take 1 tablet by mouth daily.     . polyvinyl alcohol (LIQUIFILM TEARS) 1.4 % ophthalmic solution Place 1 drop into both eyes every morning.    . potassium chloride SA (KLOR-CON M20) 20 MEQ tablet Take 1 tablet (20 mEq total) by mouth 2 (two) times a day. 270 tablet 1  . rosuvastatin (CRESTOR) 40 MG tablet Take 1 tablet (40 mg total) by mouth daily. 90 tablet 3   No current facility-administered medications on file prior to visit.     Objective:     Vitals:   07/08/19 1036  BP: 117/71  Pulse: 86                Assessment:    No obstetric history on file. Patient Active Problem List   Diagnosis Date Noted  . Flatulence 04/10/2019  . Restrictive lung disease 08/06/2016  . Left-sided low back pain with left-sided sciatica 05/01/2016  . History of colonic polyps   . Benign neoplasm of ascending colon   . Benign neoplasm of transverse colon   . Rash and nonspecific skin eruption 08/30/2014  . Postmenopausal estrogen deficiency 08/30/2014  . Left Achilles  tendinitis 04/23/2014  . Chronic diastolic heart failure ([place]) 04/08/2014  . Obesity (BMI 30-39.9) 04/09/2013  . Diabetes mellitus type 2, controlled ([place]) 01/07/2013  . Edema 01/07/2013  . Chronic hypoxemic respiratory failure ([place]) 11/11/2012  . Obstructive sleep apnea 11/11/2012  . Dyspnea 08/18/2012  . Fatigue 08/18/2012  . Meralgia paresthetica of left side 08/20/2011  . Myalgia 05/03/2011  . Leg pain, lateral 04/12/2011  . GOUT 12/26/2009  . CARDIOMYOPATHY 10/08/2007  . ALLERGIC RHINITIS, SEASONAL 10/08/2007  . HYPERLIPIDEMIA, MIXED 05/02/2007     1. Postmenopausal bleeding     Likely atrophic vaginitis causing bleeding based on history.  The previous diagnosis of "thickened endometrium" at [facility] gives one pause but was unlikely to be hyperplasia or atypical hyperplasia because of no further treatment required.     Plan:            1.  We have discussed multiple options including the possibility of endometrial biopsy.  Patient has chosen ultrasound to measure thickness of the endometrial lining understanding that if it is increased she will require endometrial biopsy.    The possibility of bleeding from atrophic vaginitis was discussed and patient does not seem amenable to estrogen treatment should this be the case.  Orders Orders Placed This Encounter  Procedures  . [place] GYN Pelvis Complete    No orders of the defined types were placed in this encounter.     F/U  No follow-ups on file.  I spent 22 minutes involved in the care of this patient of which greater than 50% was spent discussing endometrial changes associated with postmenopausal bleeding including hyperplasia and malignancy.  Atrophic vaginitis also causing postmenopausal bleeding.  Differential diagnosis work-up and treatment discussed.  All questions answered.  Finis Bud, M.D. 07/08/2019 11:13 AM

## 2019-07-08 NOTE — Progress Notes (Signed)
Patient comes in today for new GYN visit. She has been referred for PMB. She had bleeding for about a week. She had this about a year ago. She is being treated right now for BV.

## 2019-07-09 ENCOUNTER — Ambulatory Visit: Payer: Medicare Other

## 2019-07-09 ENCOUNTER — Ambulatory Visit (INDEPENDENT_AMBULATORY_CARE_PROVIDER_SITE_OTHER): Payer: Medicare Other

## 2019-07-09 DIAGNOSIS — N950 Postmenopausal bleeding: Secondary | ICD-10-CM | POA: Diagnosis not present

## 2019-07-17 ENCOUNTER — Telehealth: Payer: Self-pay | Admitting: Surgical

## 2019-07-17 NOTE — Telephone Encounter (Signed)
Patient called and ask about [place] results. Please advise.

## 2019-07-20 ENCOUNTER — Other Ambulatory Visit (INDEPENDENT_AMBULATORY_CARE_PROVIDER_SITE_OTHER): Payer: Medicare Other

## 2019-07-20 ENCOUNTER — Other Ambulatory Visit: Payer: Self-pay

## 2019-07-20 DIAGNOSIS — E782 Mixed hyperlipidemia: Secondary | ICD-10-CM | POA: Diagnosis not present

## 2019-07-20 LAB — COMPREHENSIVE METABOLIC PANEL
ALT: 20 U/L (ref 0–35)
AST: 19 U/L (ref 0–37)
Albumin: 3.7 g/dL (ref 3.5–5.2)
Alkaline Phosphatase: 75 U/L (ref 39–117)
BUN: 15 mg/dL (ref 6–23)
CO2: 32 mEq/L (ref 19–32)
Calcium: 9.1 mg/dL (ref 8.4–10.5)
Chloride: 100 mEq/L (ref 96–112)
Creatinine, Ser: 0.54 mg/dL (ref 0.40–1.20)
GFR: 110.84 mL/min (ref >60.00)
Glucose, Bld: 125 mg/dL — ABNORMAL HIGH (ref 70–99)
Potassium: 3.9 mEq/L (ref 3.5–5.1)
Sodium: 140 mEq/L (ref 135–145)
Total Bilirubin: 0.4 mg/dL (ref 0.2–1.2)
Total Protein: 7 g/dL (ref 6.0–8.3)

## 2019-07-20 LAB — LDL CHOLESTEROL, DIRECT: Direct LDL: 106 mg/dL

## 2019-07-20 LAB — LIPID PANEL
Cholesterol: 217 mg/dL — ABNORMAL HIGH (ref 0–200)
HDL: 65.5 mg/dL (ref >39.00)
NonHDL: 151.77
Total CHOL/HDL Ratio: 3
Triglycerides: 220 mg/dL — ABNORMAL HIGH (ref 0.0–149.0)
VLDL: 44 mg/dL — ABNORMAL HIGH (ref 0.0–40.0)

## 2019-07-20 NOTE — Addendum Note (Signed)
Addended by: Zannie Cove on: 07/20/2019 08:56 AM   Modules accepted: Orders

## 2019-07-28 ENCOUNTER — Telehealth: Payer: Self-pay | Admitting: Podiatry

## 2019-07-28 NOTE — Telephone Encounter (Signed)
Pt called asking about her inserts that were ordered over a yr ago.   Upon checking it was diabetic inserts and they show they were there in august 2019. Can someone please check to see if they are there and let pt know. She will need to see Liliane Channel to pick them up.

## 2019-07-28 NOTE — Telephone Encounter (Signed)
LM for patient to return call.

## 2019-07-29 ENCOUNTER — Telehealth: Payer: Self-pay | Admitting: Obstetrics and Gynecology

## 2019-07-29 NOTE — Telephone Encounter (Signed)
Please see previous message

## 2019-07-29 NOTE — Telephone Encounter (Signed)
Pt returning call from nurse. Pt is requesting a call back. Please advise

## 2019-07-29 NOTE — Telephone Encounter (Signed)
Scheduled patient to come in on 08/11/2019 for biopsy.

## 2019-08-11 ENCOUNTER — Other Ambulatory Visit: Payer: Self-pay

## 2019-08-11 ENCOUNTER — Encounter: Payer: Self-pay | Admitting: Obstetrics and Gynecology

## 2019-08-11 ENCOUNTER — Ambulatory Visit: Payer: Medicare Other | Admitting: Obstetrics and Gynecology

## 2019-08-11 ENCOUNTER — Other Ambulatory Visit (HOSPITAL_COMMUNITY)
Admission: RE | Admit: 2019-08-11 | Discharge: 2019-08-11 | Disposition: A | Discharge status: Home / Self Care | Payer: Medicare Other | Source: Ambulatory Visit | Attending: Obstetrics and Gynecology | Admitting: Obstetrics and Gynecology

## 2019-08-11 VITALS — BP 126/79 | HR 101 | Ht 65.0 in | Wt 203.9 lb

## 2019-08-11 DIAGNOSIS — R9389 Abnormal findings on diagnostic imaging of other specified body structures: Secondary | ICD-10-CM | POA: Diagnosis present

## 2019-08-11 DIAGNOSIS — N950 Postmenopausal bleeding: Secondary | ICD-10-CM | POA: Insufficient documentation

## 2019-08-11 NOTE — Addendum Note (Signed)
Addended by: Durwin Glaze on: 08/11/2019 01:24 PM   Modules accepted: Orders

## 2019-08-11 NOTE — Progress Notes (Signed)
Patient comes in today for endometrium biopsy.

## 2019-08-11 NOTE — Progress Notes (Signed)
HPI:      [name] is a 73 y.o. No obstetric history on file. who LMP was No LMP recorded. Patient is postmenopausal.  Subjective:   She presents today after having 2 episodes of postmenopausal bleeding separated by approximately 1 year.  Since her bleeding 1 month ago she has had no further bleeding.  She chose to have an ultrasound to measure endometrial thickness and this came back abnormal. He presents today for endometrial biopsy.    Hx: The following portions of the patient's history were reviewed and updated as appropriate:             She  has a past medical history of Allergic rhinitis, Arthritis, Cataracts, bilateral, CHOLECYSTECTOMY, HX OF (10/08/2007), Chronic diastolic CHF (congestive heart failure) ([place]), Chronic respiratory failure ([place]), Diabetes mellitus without complication ([place]), Diverticulosis, Glucose intolerance (impaired glucose tolerance), Gout, History of chicken pox, Hyperlipidemia, Hypertension, NICM (nonischemic cardiomyopathy) ([place]) (2002), Obesity, PONV (postoperative nausea and vomiting), Restrictive lung disease, Skin cancer, and Sleep apnea. She does not have any pertinent problems on file. She  has a past surgical history that includes nsvd; Cystectomy (1996); choleystectomy (02/2002); Vaginal delivery; Tubal ligation (1975); Dilation and curettage of uterus (2011); Induced abortion; Cardiac catheterization; Bilateral VATS ablation (Right); Colonoscopy with propofol (N/A, 12/20/2014); and Breast excisional biopsy (Left, 80's). Her family history includes COPD in her father; Cancer in her maternal grandfather and maternal grandmother; Cancer (age of onset: 68) in her mother; Depression in her sister; Diabetes in her father, paternal grandfather, and sister; Heart attack in her father; Heart disease in her father and paternal grandfather; Hyperlipidemia in her father; Hypertension in her brother and father; Lymphoma in her mother; Meniere's disease in her  brother; Pneumonia in her father; Schizophrenia in her daughter; Stroke in her father. She  reports that she has never smoked. She has never used smokeless tobacco. She reports that she does not drink alcohol or use drugs. She has a current medication list which includes the following prescription(s): aspirin, calcium carbonate, carvedilol, co q 10, fish oil, losartan, multivitamin, polyvinyl alcohol, and potassium chloride sa. She is allergic to amoxicillin; etodolac; hydrochlorothiazide; meloxicam; sulfa antibiotics; allopurinol; metformin and related; and sulfonamide derivatives.       Review of Systems:  Review of Systems  Constitutional: Denied constitutional symptoms, night sweats, recent illness, fatigue, fever, insomnia and weight loss.  Eyes: Denied eye symptoms, eye pain, photophobia, vision change and visual disturbance.  Ears/Nose/Throat/Neck: Denied ear, nose, throat or neck symptoms, hearing loss, nasal discharge, sinus congestion and sore throat.  Cardiovascular: Denied cardiovascular symptoms, arrhythmia, chest pain/pressure, edema, exercise intolerance, orthopnea and palpitations.  Respiratory: Denied pulmonary symptoms, asthma, pleuritic pain, productive sputum, cough, dyspnea and wheezing.  Gastrointestinal: Denied, gastro-esophageal reflux, melena, nausea and vomiting.  Genitourinary: Denied genitourinary symptoms including symptomatic vaginal discharge, pelvic relaxation issues, and urinary complaints.  Musculoskeletal: Denied musculoskeletal symptoms, stiffness, swelling, muscle weakness and myalgia.  Dermatologic: Denied dermatology symptoms, rash and scar.  Neurologic: Denied neurology symptoms, dizziness, headache, neck pain and syncope.  Psychiatric: Denied psychiatric symptoms, anxiety and depression.  Endocrine: Denied endocrine symptoms including hot flashes and night sweats.   Meds:   Current Outpatient Medications on File Prior to Visit  Medication Sig Dispense  Refill  . aspirin 81 MG EC tablet Take 1 tablet (81 mg total) by mouth daily. 90 tablet 2  . Calcium Carbonate (CALTRATE 600 PO) Take by mouth every morning.    . carvedilol (COREG) 12.5 MG tablet TAKE  1 TABLET BY MOUTH TWICE A DAY WITH MEALS 180 tablet 2  . Coenzyme Q10 (CO Q 10) 100 MG CAPS Take 100 mg by mouth at bedtime.     . Fish Oil OIL Take 227 mcg by mouth 2 (two) times daily. GUMMIES    . losartan (COZAAR) 50 MG tablet TAKE 0.5 TABLETS (25 MG TOTAL) BY MOUTH DAILY. 45 tablet 3  . multivitamin (THERAGRAN) per tablet Take 1 tablet by mouth daily.     . polyvinyl alcohol (LIQUIFILM TEARS) 1.4 % ophthalmic solution Place 1 drop into both eyes every morning.    . potassium chloride SA (KLOR-CON M20) 20 MEQ tablet Take 1 tablet (20 mEq total) by mouth 2 (two) times a day. 270 tablet 1   No current facility-administered medications on file prior to visit.    Objective:     Vitals:   08/11/19 1139  BP: 126/79  Pulse: (!) 101              Physical examination   Pelvic:   Vulva: Normal appearance.  No lesions.  Vagina: No lesions or abnormalities noted.  Support: Normal pelvic support.  Urethra No masses tenderness or scarring.  Meatus Normal size without lesions or prolapse.  Cervix: Normal appearance.  No lesions.  Anus: Normal exam.  No lesions.  Perineum: Normal exam.  No lesions.        Bimanual   Uterus: Normal size.  Non-tender.  Mobile.  AV.  Adnexae: No masses.  Non-tender to palpation.  Cul-de-sac: Negative for abnormality.   Endometrial Biopsy After discussion with the patient regarding her abnormal uterine bleeding I recommended that she proceed with an endometrial biopsy for further diagnosis. The risks, benefits, alternatives, and indications for an endometrial biopsy were discussed with the patient in detail. She understood the risks including infection, bleeding, cervical laceration and uterine perforation.  Verbal consent was obtained.   PROCEDURE NOTE:   Vacurette endometrial biopsy was performed using aseptic technique with iodine preparation.  The uterus was sounded to a length of 8 cm.  Adequate sampling was obtained with minimal blood loss.  As there was a scant tissue, two passes were performed.  I believe it was placed intrauterine correctly and if there was tissue to obtain it would have been found.  The patient tolerated the procedure well.  Disposition will be pending pathology   Assessment:    No obstetric history on file. Patient Active Problem List   Diagnosis Date Noted  . Flatulence 04/10/2019  . Restrictive lung disease 08/06/2016  . Left-sided low back pain with left-sided sciatica 05/01/2016  . History of colonic polyps   . Benign neoplasm of ascending colon   . Benign neoplasm of transverse colon   . Rash and nonspecific skin eruption 08/30/2014  . Postmenopausal estrogen deficiency 08/30/2014  . Left Achilles tendinitis 04/23/2014  . Chronic diastolic heart failure ([place]) 04/08/2014  . Obesity (BMI 30-39.9) 04/09/2013  . Diabetes mellitus type 2, controlled ([place]) 01/07/2013  . Edema 01/07/2013  . Chronic hypoxemic respiratory failure ([place]) 11/11/2012  . Obstructive sleep apnea 11/11/2012  . Dyspnea 08/18/2012  . Fatigue 08/18/2012  . Meralgia paresthetica of left side 08/20/2011  . Myalgia 05/03/2011  . Leg pain, lateral 04/12/2011  . GOUT 12/26/2009  . CARDIOMYOPATHY 10/08/2007  . ALLERGIC RHINITIS, SEASONAL 10/08/2007  . HYPERLIPIDEMIA, MIXED 05/02/2007     1. Abnormal vaginal bleeding with endometrial thickness greater than 5 mm present on transvaginal ultrasound in postmenopausal patient  Plan:            1.  Await biopsy results-discussed management with patient when results complete. Orders No orders of the defined types were placed in this encounter.   No orders of the defined types were placed in this encounter.     F/U  Return for We will contact her with any abnormal test results. I  spent 17 minutes involved in the care of this patient of which greater than 50% was spent discussing postmenopausal bleeding endometrial thickening endometrial biopsy possible future treatments.  All questions answered.  Finis Bud, M.D. 08/11/2019 12:24 PM

## 2019-08-13 LAB — SURGICAL PATHOLOGY

## 2019-09-04 DIAGNOSIS — J9610 Chronic respiratory failure, unspecified whether with hypoxia or hypercapnia: Secondary | ICD-10-CM | POA: Diagnosis not present

## 2019-09-09 DIAGNOSIS — D2371 Other benign neoplasm of skin of right lower limb, including hip: Secondary | ICD-10-CM | POA: Diagnosis not present

## 2019-09-09 DIAGNOSIS — Z08 Encounter for follow-up examination after completed treatment for malignant neoplasm: Secondary | ICD-10-CM | POA: Diagnosis not present

## 2019-09-09 DIAGNOSIS — Z85828 Personal history of other malignant neoplasm of skin: Secondary | ICD-10-CM | POA: Diagnosis not present

## 2019-09-09 DIAGNOSIS — X32XXXA Exposure to sunlight, initial encounter: Secondary | ICD-10-CM | POA: Diagnosis not present

## 2019-09-09 DIAGNOSIS — D2372 Other benign neoplasm of skin of left lower limb, including hip: Secondary | ICD-10-CM | POA: Diagnosis not present

## 2019-09-09 DIAGNOSIS — L570 Actinic keratosis: Secondary | ICD-10-CM | POA: Diagnosis not present

## 2019-10-01 DIAGNOSIS — G4733 Obstructive sleep apnea (adult) (pediatric): Secondary | ICD-10-CM | POA: Diagnosis not present

## 2019-10-05 DIAGNOSIS — J9610 Chronic respiratory failure, unspecified whether with hypoxia or hypercapnia: Secondary | ICD-10-CM | POA: Diagnosis not present

## 2019-10-07 ENCOUNTER — Other Ambulatory Visit (HOSPITAL_COMMUNITY): Payer: Self-pay | Admitting: Internal Medicine

## 2019-10-07 NOTE — Telephone Encounter (Signed)
This is a CHF pt 

## 2019-10-08 NOTE — Telephone Encounter (Signed)
Patient graduated from HF clinic 12/2017 and was referred to [facility]

## 2019-10-14 ENCOUNTER — Ambulatory Visit (INDEPENDENT_AMBULATORY_CARE_PROVIDER_SITE_OTHER): Payer: Medicare PPO | Admitting: Family Medicine

## 2019-10-14 ENCOUNTER — Other Ambulatory Visit: Payer: Self-pay

## 2019-10-14 ENCOUNTER — Encounter: Payer: Self-pay | Admitting: Family Medicine

## 2019-10-14 VITALS — Ht 65.0 in | Wt 205.0 lb

## 2019-10-14 DIAGNOSIS — R0600 Dyspnea, unspecified: Secondary | ICD-10-CM | POA: Diagnosis not present

## 2019-10-14 DIAGNOSIS — M5412 Radiculopathy, cervical region: Secondary | ICD-10-CM | POA: Diagnosis not present

## 2019-10-14 DIAGNOSIS — E782 Mixed hyperlipidemia: Secondary | ICD-10-CM | POA: Diagnosis not present

## 2019-10-14 DIAGNOSIS — R0609 Other forms of dyspnea: Secondary | ICD-10-CM

## 2019-10-14 DIAGNOSIS — E1121 Type 2 diabetes mellitus with diabetic nephropathy: Secondary | ICD-10-CM

## 2019-10-14 MED ORDER — LOSARTAN POTASSIUM 50 MG PO TABS: 25.0000 mg | ORAL_TABLET | Freq: Every day | ORAL | 3 refills | Status: DC

## 2019-10-14 NOTE — Assessment & Plan Note (Signed)
Check A1c.  Consider medication if uncontrolled.

## 2019-10-14 NOTE — Assessment & Plan Note (Addendum)
Ongoing issue over the last 8 months.  Discussed obtaining lab work and chest x-ray to start.  She will likely need to follow-up with pulmonology and cardiology.  Advised to seek medical attention for any worsening dyspnea or chest pain.

## 2019-10-14 NOTE — Assessment & Plan Note (Signed)
She reports she was unable to tolerate statin therapy.  We will check an LDL.

## 2019-10-14 NOTE — Progress Notes (Signed)
Virtual Visit via telephone Note  This visit type was conducted due to national recommendations for restrictions regarding the COVID-19 pandemic (e.g. social distancing).  This format is felt to be most appropriate for this patient at this time.  All issues noted in this document were discussed and addressed.  No physical exam was performed (except for noted visual exam findings with Video Visits).   I connected with Betty [name] today at 10:00 AM EST by telephone and verified that I am speaking with the correct person using two identifiers. Location patient: home Location provider: work Persons participating in the virtual visit: patient, provider  I discussed the limitations, risks, security and privacy concerns of performing an evaluation and management service by telephone and the availability of in person appointments. I also discussed with the patient that there may be a patient responsible charge related to this service. The patient expressed understanding and agreed to proceed.  Interactive audio and video telecommunications were attempted between this provider and patient, however failed, due to patient having technical difficulties OR patient did not have access to video capability.  We continued and completed visit with audio only.   Reason for visit: follow-up  HPI: Dyspnea on exertion: This is a chronic issue.  Notes it worsened starting around 8 months ago.  Has been relatively stable.  No chest pain.  No wheezing.  Minimal cough.  Does have chronic sinus drainage.  No orthopnea or PND.  She does not wear home oxygen.  Notes her O2 sats are 90-91% consistently.  She does note some mild feet swelling that is been going on for some time.  She does not take Lasix.  She has reduced her liquid intake.  BP was 127/68.  Diabetes: She was unable to tolerate Jardiance.  Has not been able to exercise given her dyspnea.  She is pretty much anything she wants.  Right hand numbness:  Patient notes this falls asleep when she sleeps on her right shoulder.  If she moves her shoulder it improves.  She does note some mild neck pain at times.  Some right shoulder discomfort and feels as though her shoulder cracks when she moves it.   ROS: See pertinent positives and negatives per HPI.  Past Medical History:  Diagnosis Date  . Allergic rhinitis    never tested, fall and spring  . Arthritis    hands,knees, feet  . Cataracts, bilateral    not surgical yet  . CHOLECYSTECTOMY, HX OF 10/08/2007   Qualifier: Diagnosis of  By: Council Mechanic MD, [name]   . Chronic diastolic CHF (congestive heart failure) ([place])   . Chronic respiratory failure ([place])   . Diabetes mellitus without complication ([place])   . Diverticulosis    problems with frequent gas, burping  . Glucose intolerance (impaired glucose tolerance)   . Gout   . History of chicken pox   . Hyperlipidemia   . Hypertension   . NICM (nonischemic cardiomyopathy) ([place]) 2002   EF 25%; improved to normal - echo 4/08: EF 50-55%, mild MR, mild LAE, mild TR;    cath 3/03: normal cors, EF 40%  . Obesity   . PONV (postoperative nausea and vomiting)   . Restrictive lung disease   . Skin cancer    skin cancers only  . Sleep apnea    cpap settings 4    Past Surgical History:  Procedure Laterality Date  . BILATERAL VATS ABLATION Right    biopsy done 2015- [facility]  . BREAST  EXCISIONAL BIOPSY Left 80's   NEG  . CARDIAC CATHETERIZATION    . choleystectomy  02/2002  . COLONOSCOPY WITH PROPOFOL N/A 12/20/2014   Procedure: COLONOSCOPY WITH PROPOFOL;  Surgeon: [name], MD;  Location: [facility];  Service: Gastroenterology;  Laterality: N/A;  . CYSTECTOMY  1996   l breast  . DILATION AND CURETTAGE OF UTERUS  2011   Dr. Hulan Fray at [facility]  . [place]    . nsvd     x 2  . TUBAL LIGATION  1975  . VAGINAL DELIVERY     x2    Family History  Problem Relation Age of Onset  . Lymphoma Mother   . Cancer Mother 21        Lymphoma  . COPD Father        was a smoker  . Stroke Father   . Pneumonia Father   . Diabetes Father   . Hyperlipidemia Father   . Heart disease Father   . Heart attack Father   . Hypertension Father   . Diabetes Sister   . Depression Sister   . Meniere's disease Brother   . Diabetes Paternal Grandfather   . Heart disease Paternal Grandfather   . Schizophrenia Daughter   . Cancer Maternal Grandmother        Bladder  . Cancer Maternal Grandfather        leukemia  . Hypertension Brother   . Colon cancer Neg Hx   . Stomach cancer Neg Hx   . Breast cancer Neg Hx     SOCIAL HX: Non-smoker   Current Outpatient Medications:  .  aspirin 81 MG EC tablet, Take 1 tablet (81 mg total) by mouth daily., Disp: 90 tablet, Rfl: 2 .  Calcium Carbonate (CALTRATE 600 PO), Take by mouth every morning., Disp: , Rfl:  .  carvedilol (COREG) 12.5 MG tablet, TAKE 1 TABLET BY MOUTH TWICE A DAY WITH MEALS, Disp: 180 tablet, Rfl: 2 .  Coenzyme Q10 (CO Q 10) 100 MG CAPS, Take 100 mg by mouth at bedtime. , Disp: , Rfl:  .  Fish Oil OIL, Take 227 mcg by mouth 2 (two) times daily. GUMMIES, Disp: , Rfl:  .  losartan (COZAAR) 50 MG tablet, Take 0.5 tablets (25 mg total) by mouth daily., Disp: 45 tablet, Rfl: 3 .  multivitamin (THERAGRAN) per tablet, Take 1 tablet by mouth daily. , Disp: , Rfl:  .  polyvinyl alcohol (LIQUIFILM TEARS) 1.4 % ophthalmic solution, Place 1 drop into both eyes every morning., Disp: , Rfl:   EXAM: This is a telehealth telephone visit and thus no physical exam was completed.   ASSESSMENT AND PLAN:  Discussed the following assessment and plan:  DOE (dyspnea on exertion) Ongoing issue over the last 8 months.  Discussed obtaining lab work and chest x-ray to start.  She will likely need to follow-up with pulmonology and cardiology.  Advised to seek medical attention for any worsening dyspnea or chest pain.  Diabetes mellitus type 2, controlled ([place]) Check A1c.  Consider  medication if uncontrolled.  Cervical radiculopathy Possible nerve impingement at her neck given her hand numbness.  She will pay attention to what portion of her hand gets numb.  She will have a neck x-ray.  HYPERLIPIDEMIA, MIXED She reports she was unable to tolerate statin therapy.  We will check an LDL.   Orders Placed This Encounter  Procedures  . DG Chest 2 View    Standing Status:  Future    Standing Expiration Date:   12/11/2020    Order Specific Question:   Reason for Exam (SYMPTOM  OR DIAGNOSIS REQUIRED)    Answer:   dyspnea on exertion for the past 8 months, history diastolic HF    Order Specific Question:   Preferred imaging location?    Answer:   [facility]    Order Specific Question:   Radiology Contrast Protocol - do NOT remove file path    Answer:   \\[email or web address]  . DG Cervical Spine Complete    Standing Status:   Future    Standing Expiration Date:   12/11/2020    Order Specific Question:   Reason for Exam (SYMPTOM  OR DIAGNOSIS REQUIRED)    Answer:   some mild neck pain with numbness in her right hand    Order Specific Question:   Preferred imaging location?    Answer[facility]    [facility]    Order Specific Question:   Radiology Contrast Protocol - do NOT remove file path    Answer:   \\[email or web address].[email or web address]  . CBC    Standing Status:   Future    Standing Expiration Date:   10/13/2020  . Comp Met (CMET)    Standing Status:   Future    Standing Expiration Date:   10/13/2020  . TSH    Standing Status:   Future    Standing Expiration Date:   10/13/2020  . B Nat Peptide    Standing Status:   Future    Standing Expiration Date:   10/13/2020  . Direct LDL    Standing Status:   Future    Standing Expiration Date:   10/13/2020  . HgB A1c    Standing Status:   Future    Standing Expiration Date:   10/13/2020    Meds ordered this encounter  Medications  . losartan (COZAAR) 50 MG  tablet    Sig: Take 0.5 tablets (25 mg total) by mouth daily.    Dispense:  45 tablet    Refill:  3     I discussed the assessment and treatment plan with the patient. The patient was provided an opportunity to ask questions and all were answered. The patient agreed with the plan and demonstrated an understanding of the instructions.   The patient was advised to call back or seek an in-person evaluation if the symptoms worsen or if the condition fails to improve as anticipated.  I provided 21 minutes of non-face-to-face time during this encounter.   Tommi Rumps, MD

## 2019-10-14 NOTE — Assessment & Plan Note (Signed)
Possible nerve impingement at her neck given her hand numbness.  She will pay attention to what portion of her hand gets numb.  She will have a neck x-ray.

## 2019-10-19 ENCOUNTER — Other Ambulatory Visit
Admission: RE | Admit: 2019-10-19 | Discharge: 2019-10-19 | Disposition: A | Discharge status: Home / Self Care | Payer: Medicare PPO | Source: Ambulatory Visit | Attending: Family Medicine | Admitting: Family Medicine

## 2019-10-19 ENCOUNTER — Encounter: Payer: Self-pay | Admitting: Gastroenterology

## 2019-10-19 ENCOUNTER — Ambulatory Visit
Admission: RE | Admit: 2019-10-19 | Discharge: 2019-10-19 | Disposition: A | Discharge status: Home / Self Care | Payer: Medicare PPO | Source: Ambulatory Visit | Attending: Family Medicine | Admitting: Family Medicine

## 2019-10-19 DIAGNOSIS — R0600 Dyspnea, unspecified: Secondary | ICD-10-CM | POA: Insufficient documentation

## 2019-10-19 DIAGNOSIS — E782 Mixed hyperlipidemia: Secondary | ICD-10-CM | POA: Insufficient documentation

## 2019-10-19 DIAGNOSIS — E1121 Type 2 diabetes mellitus with diabetic nephropathy: Secondary | ICD-10-CM | POA: Insufficient documentation

## 2019-10-19 DIAGNOSIS — R0609 Other forms of dyspnea: Secondary | ICD-10-CM

## 2019-10-19 DIAGNOSIS — M542 Cervicalgia: Secondary | ICD-10-CM | POA: Diagnosis not present

## 2019-10-19 DIAGNOSIS — M5412 Radiculopathy, cervical region: Secondary | ICD-10-CM | POA: Insufficient documentation

## 2019-10-19 DIAGNOSIS — R0602 Shortness of breath: Secondary | ICD-10-CM | POA: Diagnosis not present

## 2019-10-19 LAB — BRAIN NATRIURETIC PEPTIDE: B Natriuretic Peptide: 28 pg/mL (ref 0.0–100.0)

## 2019-10-19 LAB — COMPREHENSIVE METABOLIC PANEL
ALT: 22 U/L (ref 0–44)
AST: 25 U/L (ref 15–41)
Albumin: 3.8 g/dL (ref 3.5–5.0)
Alkaline Phosphatase: 67 U/L (ref 38–126)
Anion gap: 12 (ref 5–15)
BUN: 16 mg/dL (ref 8–23)
CO2: 29 mmol/L (ref 22–32)
Calcium: 9.5 mg/dL (ref 8.9–10.3)
Chloride: 99 mmol/L (ref 98–111)
Creatinine, Ser: 0.5 mg/dL (ref 0.44–1.00)
GFR calc Af Amer: >60 mL/min (ref >60)
GFR calc non Af Amer: >60 mL/min (ref >60)
Glucose, Bld: 157 mg/dL — ABNORMAL HIGH (ref 70–99)
Potassium: 4 mmol/L (ref 3.5–5.1)
Sodium: 140 mmol/L (ref 135–145)
Total Bilirubin: 0.6 mg/dL (ref 0.3–1.2)
Total Protein: 7.5 g/dL (ref 6.5–8.1)

## 2019-10-19 LAB — CBC
HCT: 43.3 % (ref 36.0–46.0)
Hemoglobin: 14.5 g/dL (ref 12.0–15.0)
MCH: 32.5 pg (ref 26.0–34.0)
MCHC: 33.5 g/dL (ref 30.0–36.0)
MCV: 97.1 fL (ref 80.0–100.0)
Platelets: 229 10*3/uL (ref 150–400)
RBC: 4.46 MIL/uL (ref 3.87–5.11)
RDW: 12.8 % (ref 11.5–15.5)
WBC: 8.4 10*3/uL (ref 4.0–10.5)
nRBC: 0 % (ref 0.0–0.2)

## 2019-10-19 LAB — HEMOGLOBIN A1C
Hgb A1c MFr Bld: 7.2 % — ABNORMAL HIGH (ref 4.8–5.6)
Mean Plasma Glucose: 159.94 mg/dL

## 2019-10-19 LAB — LDL CHOLESTEROL, DIRECT: Direct LDL: 114.6 mg/dL — ABNORMAL HIGH (ref 0–99)

## 2019-10-19 LAB — TSH: TSH: 4.554 u[IU]/mL — ABNORMAL HIGH (ref 0.350–4.500)

## 2019-10-26 NOTE — Progress Notes (Signed)
@Patient  ID: Betty [name], female    DOB: Apr 16, 1946, 74 y.o.   MRN: [id number]  Chief Complaint  Patient presents with  . Follow-up    SOB    Referring provider: Leone Haven, MD   Synopsis: Betty [name] is a very pleasant female who first saw the [facility] pulmonary clinic in December 2013 for evaluation of shortness of breath. She is a lifetime nonsmoker. She developed increasing shortness of breath over the course of 2 years about 2011 to 2013. Pulmonary function testing showed restrictive lung disease with a markedly depressed DLCO at 35% predicted. CT scanning of her chest showed upper lobe groundglass abnormalities versus air trapping.  A home polysomnogram showed an AHI of 12 and multiple desaturation events to 80% independent of apneas.  An abg showed resting hypercarbia (pCO2 52), and a MIP/MEP was 36%/28% predicted.  A follow up diaphragm study was normal.  A repeat CT scan was performed, no pulmonary embolism was seen and radiology commented on atelectasis but no clear intersitial lung disease.  Blood work including an HP panel and serologies for connective tissue diseases has been essentially negative (two partial aspergillus bands on HP panel).  Two echocardiograms have not shown evidence of pulmonary hypertension.  After starting on CPAP with nasal pillows and oxygen with exertion and sleep she started feeling better.  A right heart catheterization performed 01/21/2013 did not show pulmonary hypertension.  She had an open lung biopsy at [facility] in November of 2014 which showed small amounts of fibrosis around her pulmonary arteries as well as findings consistent with very mild bronchiolitis. An exercise right heart catheterization test was performed which demonstrated dramatic increases in her left heart pressure with exercise.  HPI: 74 year old female, never smoked. PMH significant for OSA, exercised induced pulmonary hypertension,  restrictive lung disease, chronic hypoxemic respiratory failure, allergic rhinitis, chronic diastolic heart failure, cardiomyopathy, diabetes type 2, benign neoplasm colon, HLD, obesity. Former patient of Dr. Lake Bells, last seen on 12/06/17. Maintained on CPAP with oxygen. Monitor BP and encourage to continue with regular exercise. Walk test to monitor oxygen level. Follow-up in 1 year.   10/27/2019 Patient presents today for regular office visit. Reports that her breathing has been worse since last spring-summer. Experiences shortness of breath mainly with any light activity. She reports compliance with CPAP and nocturnal oxygen. She does not wear oxygen during the day, she does have a portable concentrator but did not bring it with her today. She is not getting much physical exercise at this time. She went to [facility] for 2 years but stopped going. Reports that she has gained 20 lbs in the last 2 years. She stopped taking her lasix because it dried her out too much. Reports left foot swelling. BNP was within normal range in February. A1c 7.2, PCP recommending extended release metformin but this has not been started. Her LDL is also elevated and Dr. Caryl Bis recommending cholesterol medication but statins in the past have caused her to have joint pain/muscle aches. She has a visit set up with cardiology in April.   Allergies  Allergen Reactions  . Amoxicillin Rash    REACTION: RASH  . Etodolac Other (See Comments)    Bloody stool Bloody stool  . Hydrochlorothiazide Other (See Comments)    Increases gout flare Increases gout flare  . Meloxicam Other (See Comments)    constipation Constipation  . Sulfa Antibiotics Other (See Comments)    rash  . Allopurinol   .  Metformin And Related     diarrhea  . Sulfonamide Derivatives     REACTION: RASH    Immunization History  Administered Date(s) Administered  . Fluad Quad(high Dose 65+) 04/30/2019  . H1N1 09/08/2008  . Influenza Split 06/28/2011,  05/01/2012  . Influenza Whole 07/11/2007, 06/07/2008, 07/12/2009  . Influenza, High Dose Seasonal PF 05/12/2018  . Influenza,inj,Quad PF,6+ Mos 04/23/2014, 04/21/2015  . Influenza-Unspecified 04/27/2013, 05/04/2016, 04/24/2017  . PFIZER SARS-COV-2 Vaccination 09/10/2019, 10/01/2019  . Pneumococcal Conjugate-13 01/21/2014  . Pneumococcal Polysaccharide-23 08/28/2011, 03/06/2012  . Td 10/03/2007, 10/14/2013  . Tdap 10/11/2013  . Zoster 06/21/2010  . Zoster Recombinat (Shingrix) 04/13/2019, 05/18/2019, 07/16/2019    Past Medical History:  Diagnosis Date  . Allergic rhinitis    never tested, fall and spring  . Arthritis    hands,knees, feet  . Cataracts, bilateral    not surgical yet  . CHOLECYSTECTOMY, HX OF 10/08/2007   Qualifier: Diagnosis of  By: Council Mechanic MD, [name]   . Chronic diastolic CHF (congestive heart failure) ([place])   . Chronic respiratory failure ([place])   . Diabetes mellitus without complication ([place])   . Diverticulosis    problems with frequent gas, burping  . Glucose intolerance (impaired glucose tolerance)   . Gout   . History of chicken pox   . Hyperlipidemia   . Hypertension   . NICM (nonischemic cardiomyopathy) ([place]) 2002   EF 25%; improved to normal - echo 4/08: EF 50-55%, mild MR, mild LAE, mild TR;    cath 3/03: normal cors, EF 40%  . Obesity   . PONV (postoperative nausea and vomiting)   . Restrictive lung disease   . Skin cancer    skin cancers only  . Sleep apnea    cpap settings 4    Tobacco History: Social History   Tobacco Use  Smoking Status Never Smoker  Smokeless Tobacco Never Used   Counseling given: Not Answered   Outpatient Medications Prior to Visit  Medication Sig Dispense Refill  . aspirin 81 MG EC tablet Take 1 tablet (81 mg total) by mouth daily. 90 tablet 2  . Calcium Carbonate (CALTRATE 600 PO) Take by mouth every morning.    . carvedilol (COREG) 12.5 MG tablet TAKE 1 TABLET BY MOUTH TWICE A DAY WITH MEALS 180 tablet 2   . Coenzyme Q10 (CO Q 10) 100 MG CAPS Take 100 mg by mouth at bedtime.     . Fish Oil OIL Take 227 mcg by mouth 2 (two) times daily. GUMMIES    . losartan (COZAAR) 50 MG tablet Take 0.5 tablets (25 mg total) by mouth daily. 45 tablet 3  . multivitamin (THERAGRAN) per tablet Take 1 tablet by mouth daily.     . polyvinyl alcohol (LIQUIFILM TEARS) 1.4 % ophthalmic solution Place 1 drop into both eyes every morning.     No facility-administered medications prior to visit.   Review of Systems  Review of Systems  Constitutional: Negative.   Respiratory: Positive for shortness of breath. Negative for cough, chest tightness and wheezing.   Cardiovascular: Positive for leg swelling. Negative for chest pain.   Physical Exam  BP 128/72 (BP Location: Left Arm, Cuff Size: Large)   Pulse 86   Temp (!) 97.5 F (36.4 C) (Temporal)   Ht 5\' 5"  (1.651 m)   Wt 207 lb 3.2 oz (94 kg)   SpO2 (!) 89%   BMI 34.48 kg/m  Physical Exam Constitutional:      Appearance: Normal appearance. She  is not ill-appearing.  HENT:     Head: Normocephalic and atraumatic.     Mouth/Throat:     Comments: Deferred d/t masking Cardiovascular:     Rate and Rhythm: Normal rate and regular rhythm.     Comments: Trace BLE edema L>R Pulmonary:     Effort: Pulmonary effort is normal.     Breath sounds: No wheezing or rhonchi.     Comments: CTA; O2 87-89% RA, needs 3L pulsed  Musculoskeletal:        General: Normal range of motion.     Cervical back: Normal range of motion and neck supple.  Skin:    General: Skin is warm and dry.  Neurological:     General: No focal deficit present.     Mental Status: She is alert and oriented to person, place, and time. Mental status is at baseline.  Psychiatric:        Mood and Affect: Mood normal.        Behavior: Behavior normal.        Thought Content: Thought content normal.        Judgment: Judgment normal.      Lab Results:  CBC    Component Value Date/Time   WBC  8.4 10/19/2019 1214   RBC 4.46 10/19/2019 1214   HGB 14.5 10/19/2019 1214   HCT 43.3 10/19/2019 1214   PLT 229 10/19/2019 1214   MCV 97.1 10/19/2019 1214   MCH 32.5 10/19/2019 1214   MCHC 33.5 10/19/2019 1214   RDW 12.8 10/19/2019 1214   LYMPHSABS 2.3 01/27/2016 1024   MONOABS 0.7 01/27/2016 1024   EOSABS 0.1 01/27/2016 1024   BASOSABS 0.0 01/27/2016 1024    BMET    Component Value Date/Time   NA 140 10/19/2019 1214   NA 139 03/09/2019 1058   K 4.0 10/19/2019 1214   CL 99 10/19/2019 1214   CO2 29 10/19/2019 1214   GLUCOSE 157 (H) 10/19/2019 1214   BUN 16 10/19/2019 1214   BUN 16 03/09/2019 1058   CREATININE 0.50 10/19/2019 1214   CREATININE 0.64 01/08/2014 1657   CALCIUM 9.5 10/19/2019 1214   GFRNONAA >60 10/19/2019 1214   GFRNONAA >89 01/08/2014 1657   GFRAA >60 10/19/2019 1214   GFRAA >89 01/08/2014 1657    BNP    Component Value Date/Time   BNP 28.0 10/19/2019 1214    ProBNP    Component Value Date/Time   PROBNP 8.0 07/30/2016 1027    Imaging: DG Chest 2 View  Result Date: 10/19/2019 CLINICAL DATA:  Eight months of dyspnea on exertion, shortness of breath EXAM: CHEST - 2 VIEW COMPARISON:  Radiograph 09/21/2016, CT 11/13/2012 FINDINGS: Stable postsurgical changes in the right upper lung. Associated elevation of the right hemidiaphragm. Central vascular congestion is noted. No evidence of frank edema. No focal consolidation. No pneumothorax or effusion. Cardiomediastinal contours are stable. No acute osseous or soft tissue abnormality. IMPRESSION: Central vascular congestion without evidence of frank edema. Stable postsurgical changes in the right upper lung. Electronically Signed   By: Lovena Le M.D.   On: 10/19/2019 23:58   DG Cervical Spine Complete  Result Date: 10/20/2019 CLINICAL DATA:  Neck pain with right hand numbness EXAM: CERVICAL SPINE - COMPLETE 4+ VIEW COMPARISON:  None. FINDINGS: Preservation of normal cervical lordosis. There is mild  retrolisthesis of C3 on C4 by approximately 3 mm. 2 mm of retrolisthesis of C5 on C6 is noted as well. Findings are likely on a degenerative basis given  facet degenerative and discogenic changes at these levels. Diffuse intervertebral disc height loss is present most pronounced at C3-4, C5-C7. Some uncinate spurring and facet hypertrophic changes are present as well maximal C5-6 and C6-7 resulting in mild-to-moderate bilateral foraminal narrowing at these levels, left slightly greater than right. No significant spinal canal stenosis. Dens is intact. Atlantoaxial alignment is maintained. No prevertebral soft tissue thickening. No paravertebral soft tissue abnormalities. No acute abnormality in the upper chest or imaged lung apices. Surgical material noted in the right apex. IMPRESSION: Diffuse degenerative changes of the cervical spine, most pronounced at C5-6 and C6-7 where there is mild-to-moderate bilateral foraminal narrowing at these levels. Electronically Signed   By: Lovena Le M.D.   On: 10/20/2019 00:01     Assessment & Plan:   Obstructive sleep apnea - Patient reports compliance with use (download not available) - Continue CPAP every night with oxygen for 4-6 hours or more  - No changes - Do not drive if experiencing excessive daytime fatigue or somnolence   Chronic hypoxemic respiratory failure (HCC) - Ambulatory O2 87% on RA - Needs to wear 3L pulsed oxygen on exertion to keep O2 >88-90% - Educated patient on the importance of compliance with oxygen use   Restrictive lung disease - Open lung BX at [facility] in 2014 showed small amt of fibrosis and mild bronchiolitis  - Overdue for repeat PFTs with DLCO   Pulmonary HTN ([place]) - Right heart catheterization test was performed which demonstrated dramatic increases in her left heart pressure with exercise - Echocardiogram in 2019 showed EF 55-60%, grade 1 DD. PA was normal-sized. Systolic pressure could not be accurately estimated -  Recommend keeping BP controlled, wearing O2 on exertion and continuing CPAP  - Continues lasix 40mg  as needed, has not taken in over a year (refill #10) - Encouraged patient to work on weight loss and staying physically active. She will consider referral to nutrition and returning to pulmonary rehab  - FU with cardiology in April   Elevated LDL cholesterol level - LDL 114, intolerant to statins in the past - Follow up with PCP/cardiology   Diabetes mellitus type 2, controlled (HCC) - A1c 7.2, previously did not tolerate regular metformin  - Dr. Caryl Bis recommended extended release metformin - Patient to follow-up with PCP regarding these recommendations    Follow-up: - June with Dr. Carlis Abbott or Shearon Stalls after PFTs    Martyn Ehrich, NP 10/27/2019

## 2019-10-27 ENCOUNTER — Other Ambulatory Visit: Payer: Self-pay

## 2019-10-27 ENCOUNTER — Ambulatory Visit: Payer: Medicare PPO | Admitting: Primary Care

## 2019-10-27 ENCOUNTER — Encounter: Payer: Self-pay | Admitting: Primary Care

## 2019-10-27 DIAGNOSIS — J9611 Chronic respiratory failure with hypoxia: Secondary | ICD-10-CM

## 2019-10-27 DIAGNOSIS — E7800 Pure hypercholesterolemia, unspecified: Secondary | ICD-10-CM

## 2019-10-27 DIAGNOSIS — G4733 Obstructive sleep apnea (adult) (pediatric): Secondary | ICD-10-CM

## 2019-10-27 DIAGNOSIS — I272 Pulmonary hypertension, unspecified: Secondary | ICD-10-CM

## 2019-10-27 DIAGNOSIS — J984 Other disorders of lung: Secondary | ICD-10-CM | POA: Diagnosis not present

## 2019-10-27 DIAGNOSIS — E1121 Type 2 diabetes mellitus with diabetic nephropathy: Secondary | ICD-10-CM | POA: Diagnosis not present

## 2019-10-27 MED ORDER — FUROSEMIDE 40 MG PO TABS: ORAL_TABLET | ORAL | 0 refills | Status: DC

## 2019-10-27 NOTE — Assessment & Plan Note (Signed)
-   Open lung BX at [facility] in 2014 showed small amt of fibrosis and mild bronchiolitis  - Overdue for repeat PFTs with DLCO

## 2019-10-27 NOTE — Assessment & Plan Note (Addendum)
-   Right heart catheterization test was performed which demonstrated dramatic increases in her left heart pressure with exercise - Echocardiogram in 2019 showed EF 55-60%, grade 1 DD. PA was normal-sized. Systolic pressure could not be accurately estimated - Recommend keeping BP controlled, wearing O2 on exertion and continuing CPAP  - Continues lasix 40mg  as needed, has not taken in over a year (refill #10) - Encouraged patient to work on weight loss and staying physically active. She will consider referral to nutrition and returning to pulmonary rehab  - FU with cardiology in April

## 2019-10-27 NOTE — Assessment & Plan Note (Addendum)
-   Patient reports compliance with use (download not available) - Continue CPAP every night with oxygen for 4-6 hours or more  - No changes - Do not drive if experiencing excessive daytime fatigue or somnolence

## 2019-10-27 NOTE — Assessment & Plan Note (Signed)
-   Ambulatory O2 87% on RA - Needs to wear 3L pulsed oxygen on exertion to keep O2 >88-90% - Educated patient on the importance of compliance with oxygen use

## 2019-10-27 NOTE — Assessment & Plan Note (Signed)
-   A1c 7.2, previously did not tolerate regular metformin  - Dr. Caryl Bis recommended extended release metformin - Patient to follow-up with PCP regarding these recommendations

## 2019-10-27 NOTE — Patient Instructions (Addendum)
Recommendations: - Continue CPAP every night with oxygen for 4-6 hours or more   - Resume lasix 40mg , take 1/2-1 tab daily as needed for leg edema  - Make sure to wear ___3L___ oxygen on exertion to keep O2 >88-90%  - Consider referral for nutrition and returning pulmonary rehab (let us or PCP know and we can order these for you)  - Continue to work on weight loss and staying physically active   - Discuss cholesterol level and optional medication with cardiology  - Discuss elevated A1C level and adding extended release metformin with PCP    Orders: - PFTs with DLCO (next available is fine)  Follow-up: - June with Dr. Carlis Abbott or Shearon Stalls

## 2019-10-27 NOTE — Assessment & Plan Note (Signed)
-   LDL 114, intolerant to statins in the past - Follow up with PCP/cardiology

## 2019-10-28 NOTE — Progress Notes (Signed)
Reviewed and agree with assessment/plan.   Janene Yousuf, MD [facility] 08/22/2016, 12:24 PM Pager:  [phone]  

## 2019-11-01 ENCOUNTER — Other Ambulatory Visit: Payer: Self-pay | Admitting: Family Medicine

## 2019-11-01 DIAGNOSIS — M5412 Radiculopathy, cervical region: Secondary | ICD-10-CM

## 2019-11-01 MED ORDER — METFORMIN HCL ER 500 MG PO TB24: 500.0000 mg | ORAL_TABLET | Freq: Every day | ORAL | 1 refills | Status: DC

## 2019-11-02 DIAGNOSIS — J9610 Chronic respiratory failure, unspecified whether with hypoxia or hypercapnia: Secondary | ICD-10-CM | POA: Diagnosis not present

## 2019-11-13 DIAGNOSIS — M5412 Radiculopathy, cervical region: Secondary | ICD-10-CM | POA: Diagnosis not present

## 2019-11-16 ENCOUNTER — Other Ambulatory Visit: Payer: Self-pay

## 2019-11-17 ENCOUNTER — Ambulatory Visit: Payer: Medicare PPO | Admitting: Family Medicine

## 2019-12-01 ENCOUNTER — Other Ambulatory Visit: Payer: Self-pay | Admitting: Family Medicine

## 2019-12-03 ENCOUNTER — Other Ambulatory Visit: Payer: Self-pay

## 2019-12-03 ENCOUNTER — Ambulatory Visit: Payer: Medicare PPO | Admitting: Family Medicine

## 2019-12-03 ENCOUNTER — Telehealth: Payer: Self-pay | Admitting: Family Medicine

## 2019-12-03 ENCOUNTER — Encounter: Payer: Self-pay | Admitting: Family Medicine

## 2019-12-03 DIAGNOSIS — Z5181 Encounter for therapeutic drug level monitoring: Secondary | ICD-10-CM

## 2019-12-03 DIAGNOSIS — E1121 Type 2 diabetes mellitus with diabetic nephropathy: Secondary | ICD-10-CM

## 2019-12-03 DIAGNOSIS — R0609 Other forms of dyspnea: Secondary | ICD-10-CM

## 2019-12-03 DIAGNOSIS — J9610 Chronic respiratory failure, unspecified whether with hypoxia or hypercapnia: Secondary | ICD-10-CM | POA: Diagnosis not present

## 2019-12-03 DIAGNOSIS — R0600 Dyspnea, unspecified: Secondary | ICD-10-CM

## 2019-12-03 MED ORDER — TORSEMIDE 10 MG PO TABS: 10.0000 mg | ORAL_TABLET | Freq: Every day | ORAL | 0 refills | Status: DC | PRN

## 2019-12-03 NOTE — Telephone Encounter (Signed)
I called and LVM for the patient to call and let us know how she is doing. Nina,cma

## 2019-12-03 NOTE — Assessment & Plan Note (Signed)
I encouraged her to try the Metformin XR.  If she is not able to tolerate this she will let us know.  Encouraged dietary changes as well.

## 2019-12-03 NOTE — Patient Instructions (Signed)
Nice to see you. Please try the torsemide for the next 2 days to see if it is helpful with your breathing and swelling.  Somebody contact you on Monday to follow-up on this. Please try the Metformin XR and if you have any side effects please let us know.

## 2019-12-03 NOTE — Telephone Encounter (Signed)
Please follow-up with the patient on Monday to see how she is doing with her breathing and swelling while taking the torsemide.  Thanks.

## 2019-12-03 NOTE — Assessment & Plan Note (Signed)
Likely multifactorial.  She does have some swelling which could indicate volume overload though her BNP was previously normal.  She is not able to tolerate Lasix due to dryness.  We will see if she can tolerate a low-dose of torsemide for a couple of days and see if that makes a difference.  We will have somebody contact her on Monday to follow-up on this.  She will keep her appointment with cardiology.  She will keep her appointment for PFTs.

## 2019-12-03 NOTE — Progress Notes (Signed)
Tommi Rumps, MD Phone: [phone]  Betty [name] is a 74 y.o. female who presents today for f/u.  DOE: Patient has seen pulmonology.  They recommended that she use her oxygen with exertion though she has not done that yet.  She does not feel as though she has exerted herself much.  She notes making the bed or really much of any activity makes her short of breath.  She does note bilateral lower extremity edema left slightly greater than right.  This has been going on over the winter.  BNP was normal.  She has not taken the Lasix in quite some time as it dries out her mouth, eyes, and vagina.  She limits her liquid to less than 65 ounces daily.   Diabetes: Not checking sugars.  She has not tried the extended release Metformin yet as she thought it was the same as her prior Metformin.  She has been trying to eat more salads.  Drinking mostly water.  Social History   Tobacco Use  Smoking Status Never Smoker  Smokeless Tobacco Never Used     ROS see history of present illness  Objective  Physical Exam Vitals:   12/03/19 1030  BP: 120/70  Pulse: 89  Temp: (!) 96.2 F (35.7 C)  SpO2: 90%    BP Readings from Last 3 Encounters:  12/03/19 120/70  10/27/19 128/72  08/11/19 126/79   Wt Readings from Last 3 Encounters:  12/03/19 207 lb (93.9 kg)  10/27/19 207 lb 3.2 oz (94 kg)  10/14/19 205 lb (93 kg)    Physical Exam Constitutional:      General: She is not in acute distress.    Appearance: She is not diaphoretic.  Cardiovascular:     Rate and Rhythm: Normal rate and regular rhythm.     Heart sounds: Normal heart sounds.  Pulmonary:     Effort: Pulmonary effort is normal.     Breath sounds: Normal breath sounds.  Musculoskeletal:     Comments: 1+ pitting edema bilateral lower extremities to the lower shins  Skin:    General: Skin is warm and dry.  Neurological:     Mental Status: She is alert.      Assessment/Plan: Please see individual problem  list.  DOE (dyspnea on exertion) Likely multifactorial.  She does have some swelling which could indicate volume overload though her BNP was previously normal.  She is not able to tolerate Lasix due to dryness.  We will see if she can tolerate a low-dose of torsemide for a couple of days and see if that makes a difference.  We will have somebody contact her on Monday to follow-up on this.  She will keep her appointment with cardiology.  She will keep her appointment for PFTs.  Diabetes mellitus type 2, controlled ([place]) I encouraged her to try the Metformin XR.  If she is not able to tolerate this she will let us know.  Encouraged dietary changes as well.    No orders of the defined types were placed in this encounter.   Meds ordered this encounter  Medications  . torsemide (DEMADEX) 10 MG tablet    Sig: Take 1 tablet (10 mg total) by mouth daily as needed (edema, shortness of breath).    Dispense:  10 tablet    Refill:  0    This visit occurred during the SARS-CoV-2 public health emergency.  Safety protocols were in place, including screening questions prior to the visit, additional usage of staff  PPE, and extensive cleaning of exam room while observing appropriate contact time as indicated for disinfecting solutions.    Tommi Rumps, MD [place]

## 2019-12-03 NOTE — Telephone Encounter (Deleted)
Message sent to provider.  Betty [name],cma

## 2019-12-09 MED ORDER — TORSEMIDE 10 MG PO TABS: 10.0000 mg | ORAL_TABLET | Freq: Every day | ORAL | 0 refills | Status: DC | PRN

## 2019-12-09 NOTE — Addendum Note (Signed)
Addended by: Leone Haven on: 12/09/2019 04:05 PM   Modules accepted: Orders

## 2019-12-09 NOTE — Telephone Encounter (Signed)
Pt called back and said that she is tolerating  the Torsemide  Her swelling has improved but she feels like her breathing is the same She would like a refill.  Aydn Ferrara,cma

## 2019-12-09 NOTE — Telephone Encounter (Signed)
I will refill though she needs labs completed Friday or Monday. Orders placed.

## 2019-12-09 NOTE — Telephone Encounter (Signed)
Pt called back and said that she is tolerate  the Torsemide  Her swelling has improved but she feels like her breathing is the same She would like a refill

## 2019-12-14 ENCOUNTER — Other Ambulatory Visit (INDEPENDENT_AMBULATORY_CARE_PROVIDER_SITE_OTHER): Payer: Medicare PPO

## 2019-12-14 ENCOUNTER — Other Ambulatory Visit: Payer: Self-pay

## 2019-12-14 DIAGNOSIS — Z5181 Encounter for therapeutic drug level monitoring: Secondary | ICD-10-CM

## 2019-12-14 LAB — BASIC METABOLIC PANEL
BUN: 15 mg/dL (ref 6–23)
CO2: 33 mEq/L — ABNORMAL HIGH (ref 19–32)
Calcium: 9.4 mg/dL (ref 8.4–10.5)
Chloride: 100 mEq/L (ref 96–112)
Creatinine, Ser: 0.54 mg/dL (ref 0.40–1.20)
GFR: 110.71 mL/min (ref >60.00)
Glucose, Bld: 154 mg/dL — ABNORMAL HIGH (ref 70–99)
Potassium: 3.9 mEq/L (ref 3.5–5.1)
Sodium: 138 mEq/L (ref 135–145)

## 2019-12-17 DIAGNOSIS — B351 Tinea unguium: Secondary | ICD-10-CM | POA: Diagnosis not present

## 2019-12-17 DIAGNOSIS — E1142 Type 2 diabetes mellitus with diabetic polyneuropathy: Secondary | ICD-10-CM | POA: Diagnosis not present

## 2019-12-21 NOTE — Progress Notes (Signed)
Cardiology Office Note:    Date:  12/22/2019   ID:  Betty [name], Betty [name] 11-18-1945, MRN [id number]  PCP:  Leone Haven, MD  Cardiologist:  No primary care provider on file.    Referring MD: Leone Haven, MD   Chief Complaint  Patient presents with  . Congestive Heart Failure  . Shortness of Breath    History of Present Illness:    Betty [name] is a 74 y.o. female with a hx of NICM with previous EF 25% (improved to normal), HL, gout, glucose intol, normal cors by [facility] in 2003, OSA.  She also had chronic shortness of breath with PFT showing restrictive lung disease with markedly depressed DLCO at 35% predicted.  CT scan showed upper lobe groundglass abnormalities versus air trapping.  Home sleep study showed mild obstructive sleep apnea with an AHI of 12 and oxygen desaturations to 80% independent of apneas.  Been seen by pulmonary with an extensive work-up.  Blood work including an HP panel and serologies for connective tissue diseases have all been normal.  2 echoes have showed no evidence of pulmonary hypertension.  After starting CPAP she was feeling better.  An open lung biopsy at [facility] in 2014 showed small amounts of fibrosis and pulmonary arteries as well as very mild bronchiolitis  RHC with exercise in 3/15 showed marked increase in pulmonary pressures and PCWP with exercise with no change in PVR. O2 sats down to 77% with exercise. Corrected with O2. PA rest 36/20 (26) PCWP 15 with exercise PA 78/40 (56) PCWP 28 This was felt to reflect diastolic dysfunction and intrinsic lung disease. Lung bx was not suggestive of clear lung pathology. CT was neg for PE. Continued management of diastolic CHFwas been recommended.   Studies: - RHC (5/14): RA 9, RV 32/8, PA 30/16/23, PCWP 12, 2.1 WU, CO 5.3, CI 2.7 - Echo (3/14): EF 55-60%, no RWMA, Gr 1 DD, mild MR - Nuclear (11/13): Breast atten, no ischemia, EF 55%; Low Risk  - PFTs. [place] 2014: FVC 1.46 L (48% pred),  TLC 2.77 L (55% pred) ERV 0.03 (3% pred), DLCO 8.4 (35% pred) - PFTs FVC 1.74 (55%), TLC 3.19 (61%), DLCO 15.89 (62%)  She has NYHA class 1 CHF.  She is on home O2 with CPAP followed by Dr. Lake Bells that she uses only at night.  She takes Lasix on an as-needed basis.  She is here today for followup.  She has really has problems with her breathing and DOE.  She has been very sedentary during the [place] pandemic.  Her last appt with Pulmonary in March noted ambulatory O2 desats to 87% on RA and it was recommended that she stay on 3L pulsed O2 on exertion to keep O2 sast > 88-90%. She has repeat PFTs scheduled next month.  She has chronic LE edema which had improved and she stopped taking Lasix because it was drying her out.  She is now on Torsemide 10mg  daily but feels like it drys her out. BNP in Feb was normal. She has not been compliant with using her O2 when she is out and when she is exerting herself.  She denies any chest pain or pressure,  PND, orthopnea, dizziness, palpitations or syncope. She is compliant with his meds and is tolerating meds with no SE.    Past Medical History:  Diagnosis Date  . Allergic rhinitis    never tested, fall and spring  . Arthritis    hands,knees, feet  .  Cataracts, bilateral    not surgical yet  . CHOLECYSTECTOMY, HX OF 10/08/2007   Qualifier: Diagnosis of  By: Council Mechanic MD, [name]   . Chronic diastolic CHF (congestive heart failure) ([place])   . Chronic respiratory failure ([place])   . Diabetes mellitus without complication ([place])   . Diverticulosis    problems with frequent gas, burping  . Glucose intolerance (impaired glucose tolerance)   . Gout   . History of chicken pox   . Hyperlipidemia   . Hypertension   . NICM (nonischemic cardiomyopathy) ([place]) 2002   EF 25%; improved to normal - echo 4/08: EF 50-55%, mild MR, mild LAE, mild TR;    cath 3/03: normal cors, EF 40%  . Obesity   . PONV (postoperative nausea and vomiting)   . Restrictive lung disease    . Skin cancer    skin cancers only  . Sleep apnea    cpap settings 4    Past Surgical History:  Procedure Laterality Date  . BILATERAL VATS ABLATION Right    biopsy done 2015- [facility]  . BREAST EXCISIONAL BIOPSY Left 80's   NEG  . CARDIAC CATHETERIZATION    . choleystectomy  02/2002  . COLONOSCOPY WITH PROPOFOL N/A 12/20/2014   Procedure: COLONOSCOPY WITH PROPOFOL;  Surgeon: [name], MD;  Location: [facility]COPY;  Service: Gastroenterology;  Laterality: N/A;  . CYSTECTOMY  1996   l breast  . DILATION AND CURETTAGE OF UTERUS  2011   Dr. Hulan Fray at [facility]  . [place]    . nsvd     x 2  . TUBAL LIGATION  1975  . VAGINAL DELIVERY     x2    Current Medications: Current Meds  Medication Sig  . aspirin 81 MG EC tablet Take 1 tablet (81 mg total) by mouth daily.  Marland Kitchen CALCIUM-MAGNESIUM-VITAMIN D PO Take 1 tablet by mouth daily.  . carvedilol (COREG) 12.5 MG tablet TAKE 1 TABLET BY MOUTH TWICE A DAY WITH MEALS  . Coenzyme Q10 (CO Q 10) 100 MG CAPS Take 100 mg by mouth at bedtime.   . Fish Oil OIL Take 227 mcg by mouth 2 (two) times daily. GUMMIES  . losartan (COZAAR) 50 MG tablet Take 0.5 tablets (25 mg total) by mouth daily.  . metFORMIN (GLUCOPHAGE-XR) 500 MG 24 hr tablet TAKE 1 TABLET BY MOUTH EVERY DAY WITH BREAKFAST  . multivitamin (THERAGRAN) per tablet Take 1 tablet by mouth daily.   . polyvinyl alcohol (LIQUIFILM TEARS) 1.4 % ophthalmic solution Place 1 drop into both eyes every morning.  . torsemide (DEMADEX) 10 MG tablet Take 1 tablet (10 mg total) by mouth daily as needed (edema, shortness of breath).     Allergies:   Amoxicillin, Etodolac, Hydrochlorothiazide, Meloxicam, Sulfa antibiotics, Allopurinol, Metformin and related, and Sulfonamide derivatives   Social History   Socioeconomic History  . Marital status: Married    Spouse name: Not on file  . Number of children: 2  . Years of education: Not on file  . Highest education level: Not on file   Occupational History  . Occupation: Retired Product manager: retired  . Occupation: Works at [employer]  . Smoking status: Never Smoker  . Smokeless tobacco: Never Used  Substance and Sexual Activity  . Alcohol use: No    Alcohol/week: 0.0 standard drinks  . Drug use: No  . Sexual activity: Never  Other Topics Concern  . Not on file  Social History Narrative   Lives in [place] with husband. 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 No pets.   Social Determinants of Health   Financial Resource Strain:   . Difficulty of Paying Living Expenses:   Food Insecurity:   . Worried About Charity fundraiser in the Last Year:   . Arboriculturist in the Last Year:   Transportation Needs:   . Film/video editor (Medical):   Marland Kitchen Lack of Transportation (Non-Medical):   Physical Activity:   . Days of Exercise per Week:   . Minutes of Exercise per Session:   Stress:   . Feeling of Stress :   Social Connections:   . Frequency of Communication with Friends and Family:   . Frequency of Social Gatherings with Friends and Family:   . Attends Religious Services:   . Active Member of Clubs or Organizations:   . Attends Archivist Meetings:   Marland Kitchen Marital Status:      Family History: The patient's family history includes Bladder Cancer in her maternal grandmother; COPD in her father; Depression in her sister; Diabetes in her father, paternal grandfather, and sister; Heart attack in her father; Heart disease in her father and paternal grandfather; Hyperlipidemia in her father; Hypertension in her brother and father; Leukemia in her maternal grandfather; Lymphoma (age of onset: 34) in her mother; Meniere's disease in her brother; Pneumonia in her father; Schizophrenia in her daughter; Stroke in her father. There is no history of Colon cancer, Stomach cancer, or Breast cancer.  ROS:   Please see the history of present illness.    ROS  All other systems  reviewed and negative.   EKGs/Labs/Other Studies Reviewed:    The following studies were reviewed today: none  EKG:  EKG is  ordered today.  The ekg ordered today demonstrates NSR with LAFB  Recent Labs: 10/19/2019: ALT 22; B Natriuretic Peptide 28.0; Hemoglobin 14.5; Platelets 229; TSH 4.554 12/14/2019: BUN 15; Creatinine, Ser 0.54; Potassium 3.9; Sodium 138   Recent Lipid Panel    Component Value Date/Time   CHOL 217 (H) 07/20/2019 0856   TRIG 220.0 (H) 07/20/2019 0856   HDL 65.50 07/20/2019 0856   CHOLHDL 3 07/20/2019 0856   VLDL 44.0 (H) 07/20/2019 0856   LDLCALC 58 04/30/2017 1048   LDLDIRECT 114.6 (H) 10/19/2019 1214    Physical Exam:    VS:  BP 120/70   Pulse 87   Ht 5\' 5"  (1.651 m)   Wt 206 lb (93.4 kg)   SpO2 90%   BMI 34.28 kg/m     Wt Readings from Last 3 Encounters:  12/22/19 206 lb (93.4 kg)  12/03/19 207 lb (93.9 kg)  10/27/19 207 lb 3.2 oz (94 kg)     GEN:  Well nourished, well developed in no acute distress HEENT: Normal NECK: No JVD; No carotid bruits LYMPHATICS: No lymphadenopathy CARDIAC: RRR, no murmurs, rubs, gallops RESPIRATORY:  Decreased BS and fine crackles at bases ABDOMEN: Soft, non-tender, non-distended MUSCULOSKELETAL:  2+ left foot and 1+ right foot  edema; No deformity  SKIN: Warm and dry NEUROLOGIC:  Alert and oriented x 3 PSYCHIATRIC:  Normal affect   ASSESSMENT:    1. Chronic diastolic heart failure (HCC)   2. Pulmonary HTN ([place])   3. Controlled type 2 diabetes mellitus with diabetic nephropathy, without long-term current use of insulin ([place])   4. Essential hypertension    PLAN:    In order of problems listed  above:  1.  Chronic diastolic CHF  -she has chronic LE edema which seems worse today -she has not been compliant with her Lasix as she says that it dries her out -her PCP changed her to Torsemide 10mg  daily but she is not taking it daily -she has increased LE edema and worsening SOB -I explained to her that in  the setting of pulmonary fibrosis, any additional fluid retention will worsen her breathing -we discussed with importance of using her O2 as prescribed.  I discussed with her that chronic hypoxemia may worsen pulmonary HTN -I stressed the importance of following a low Na diet. She is salting her food - I recommended that she follow a < 2gm Na diet -I will check a BNP today -creatinine is stable -I will give her a Rx for compression hose  2.  Chronic SOB  - this is chronic secondary to chronic diastolic CHF, medical noncompliance with Na and O2 and pulmonary fibrosis -SOB has worsened likely related to being more sedentary and also noncompliance with her O2 and volume overload from noncompliance with low Na diet -she is followed by Pulmonary -repeat PFts are pending next month  -continue O2 and diuretics  4.  Type 2 diabetes mellitus  -followed by her PCP. -This appears diet controlled.   -continue ARB for renal protection -HbA1C was 7.2% in Feb 2021  5.  Hypertension   -BP well controlled on exam -continue Carvedilol 12.5mg  BID and Losartan 12.5mg  daily -creatinine was normal at 0.54 and K+ 3.9 on outside labs from last week   Medication Adjustments/Labs and Tests Ordered: Current medicines are reviewed at length with the patient today.  Concerns regarding medicines are outlined above.  Orders Placed This Encounter  Procedures  . EKG 12-Lead   No orders of the defined types were placed in this encounter.   Signed, Fransico Him, MD  12/22/2019 10:49 AM    [place]

## 2019-12-22 ENCOUNTER — Other Ambulatory Visit: Payer: Self-pay

## 2019-12-22 ENCOUNTER — Ambulatory Visit: Payer: Medicare PPO | Admitting: Cardiology

## 2019-12-22 ENCOUNTER — Encounter: Payer: Self-pay | Admitting: Cardiology

## 2019-12-22 VITALS — BP 120/70 | HR 87 | Ht 65.0 in | Wt 206.0 lb

## 2019-12-22 DIAGNOSIS — I272 Pulmonary hypertension, unspecified: Secondary | ICD-10-CM | POA: Diagnosis not present

## 2019-12-22 DIAGNOSIS — I10 Essential (primary) hypertension: Secondary | ICD-10-CM | POA: Diagnosis not present

## 2019-12-22 DIAGNOSIS — I5032 Chronic diastolic (congestive) heart failure: Secondary | ICD-10-CM | POA: Diagnosis not present

## 2019-12-22 DIAGNOSIS — E1121 Type 2 diabetes mellitus with diabetic nephropathy: Secondary | ICD-10-CM

## 2019-12-22 MED ORDER — TORSEMIDE 10 MG PO TABS: 10.0000 mg | ORAL_TABLET | Freq: Every day | ORAL | 3 refills | Status: DC

## 2019-12-22 NOTE — Addendum Note (Signed)
Addended by: Antonieta Iba on: 12/22/2019 11:13 AM   Modules accepted: Orders

## 2019-12-22 NOTE — Patient Instructions (Signed)
Medication Instructions:  Your physician has recommended you make the following change in your medication:  1) INCREASE torsemide to 10 mg daily   *If you need a refill on your cardiac medications before your next appointment, please call your pharmacy*   Lab Work: TODAY: BNP If you have labs (blood work) drawn today and your tests are completely normal, you will receive your results only by: Marland Kitchen MyChart Message (if you have MyChart) OR . A paper copy in the mail If you have any lab test that is abnormal or we need to change your treatment, we will call you to review the results.   Follow-Up: At [facility], you and your health needs are our priority.  As part of our continuing mission to provide you with exceptional heart care, we have created designated Provider Care Teams.  These Care Teams include your primary Cardiologist (physician) and Advanced Practice Providers (APPs -  Physician Assistants and Nurse Practitioners) who all work together to provide you with the care you need, when you need it.  We recommend signing up for the patient portal called "MyChart".  Sign up information is provided on this After Visit Summary.  MyChart is used to connect with patients for Virtual Visits (Telemedicine).  Patients are able to view lab/test results, encounter notes, upcoming appointments, etc.  Non-urgent messages can be sent to your provider as well.   To learn more about what you can do with MyChart, go to [email or web address].    Your next appointment:   3 month(s)  The format for your next appointment:   In Person  Provider:   Melina Copa, PA-C or Ermalinda Barrios, PA-C  Follow up with Dr. Radford Pax in 6 months  Other Instructions  Low-Sodium Eating Plan Sodium, which is an element that makes up salt, helps you maintain a healthy balance of fluids in your body. Too much sodium can increase your blood pressure and cause fluid and waste to be held in your body. Your health care  provider or dietitian may recommend following this plan if you have high blood pressure (hypertension), kidney disease, liver disease, or heart failure. Eating less sodium can help lower your blood pressure, reduce swelling, and protect your heart, liver, and kidneys. What are tips for following this plan? General guidelines  Most people on this plan should limit their sodium intake to 1,500-2,000 mg (milligrams) of sodium each day. Reading food labels   The Nutrition Facts label lists the amount of sodium in one serving of the food. If you eat more than one serving, you must multiply the listed amount of sodium by the number of servings.  Choose foods with less than 140 mg of sodium per serving.  Avoid foods with 300 mg of sodium or more per serving. Shopping  Look for lower-sodium products, often labeled as "low-sodium" or "no salt added."  Always check the sodium content even if foods are labeled as "unsalted" or "no salt added".  Buy fresh foods. ? Avoid canned foods and premade or frozen meals. ? Avoid canned, cured, or processed meats  Buy breads that have less than 80 mg of sodium per slice. Cooking  Eat more home-cooked food and less restaurant, buffet, and fast food.  Avoid adding salt when cooking. Use salt-free seasonings or herbs instead of table salt or sea salt. Check with your health care provider or pharmacist before using salt substitutes.  Cook with plant-based oils, such as canola, sunflower, or olive oil. Meal planning  When eating  at a restaurant, ask that your food be prepared with less salt or no salt, if possible.  Avoid foods that contain MSG (monosodium glutamate). MSG is sometimes added to [place] food, bouillon, and some canned foods. What foods are recommended? The items listed may not be a complete list. Talk with your dietitian about what dietary choices are best for you. Grains Low-sodium cereals, including oats, puffed wheat and rice, and  shredded wheat. Low-sodium crackers. Unsalted rice. Unsalted pasta. Low-sodium bread. Whole-grain breads and whole-grain pasta. Vegetables Fresh or frozen vegetables. "No salt added" canned vegetables. "No salt added" tomato sauce and paste. Low-sodium or reduced-sodium tomato and vegetable juice. Fruits Fresh, frozen, or canned fruit. Fruit juice. Meats and other protein foods Fresh or frozen (no salt added) meat, poultry, seafood, and fish. Low-sodium canned tuna and salmon. Unsalted nuts. Dried peas, beans, and lentils without added salt. Unsalted canned beans. Eggs. Unsalted nut butters. Dairy Milk. Soy milk. Cheese that is naturally low in sodium, such as ricotta cheese, fresh mozzarella, or Swiss cheese Low-sodium or reduced-sodium cheese. Cream cheese. Yogurt. Fats and oils Unsalted butter. Unsalted margarine with no trans fat. Vegetable oils such as canola or olive oils. Seasonings and other foods Fresh and dried herbs and spices. Salt-free seasonings. Low-sodium mustard and ketchup. Sodium-free salad dressing. Sodium-free light mayonnaise. Fresh or refrigerated horseradish. Lemon juice. Vinegar. Homemade, reduced-sodium, or low-sodium soups. Unsalted popcorn and pretzels. Low-salt or salt-free chips. What foods are not recommended? The items listed may not be a complete list. Talk with your dietitian about what dietary choices are best for you. Grains Instant hot cereals. Bread stuffing, pancake, and biscuit mixes. Croutons. Seasoned rice or pasta mixes. Noodle soup cups. Boxed or frozen macaroni and cheese. Regular salted crackers. Self-rising flour. Vegetables Sauerkraut, pickled vegetables, and relishes. Olives. [place] fries. Onion rings. Regular canned vegetables (not low-sodium or reduced-sodium). Regular canned tomato sauce and paste (not low-sodium or reduced-sodium). Regular tomato and vegetable juice (not low-sodium or reduced-sodium). Frozen vegetables in sauces. Meats and other  protein foods Meat or fish that is salted, canned, smoked, spiced, or pickled. Bacon, ham, sausage, hotdogs, corned beef, chipped beef, packaged lunch meats, salt pork, jerky, pickled herring, anchovies, regular canned tuna, sardines, salted nuts. Dairy Processed cheese and cheese spreads. Cheese curds. Blue cheese. Feta cheese. String cheese. Regular cottage cheese. Buttermilk. Canned milk. Fats and oils Salted butter. Regular margarine. Ghee. Bacon fat. Seasonings and other foods Onion salt, garlic salt, seasoned salt, table salt, and sea salt. Canned and packaged gravies. Worcestershire sauce. Tartar sauce. Barbecue sauce. Teriyaki sauce. Soy sauce, including reduced-sodium. Steak sauce. Fish sauce. Oyster sauce. Cocktail sauce. Horseradish that you find on the shelf. Regular ketchup and mustard. Meat flavorings and tenderizers. Bouillon cubes. Hot sauce and Tabasco sauce. Premade or packaged marinades. Premade or packaged taco seasonings. Relishes. Regular salad dressings. Salsa. Potato and tortilla chips. Corn chips and puffs. Salted popcorn and pretzels. Canned or dried soups. Pizza. Frozen entrees and pot pies. Summary  Eating less sodium can help lower your blood pressure, reduce swelling, and protect your heart, liver, and kidneys.  Most people on this plan should limit their sodium intake to 1,500-2,000 mg (milligrams) of sodium each day.  Canned, boxed, and frozen foods are high in sodium. Restaurant foods, fast foods, and pizza are also very high in sodium. You also get sodium by adding salt to food.  Try to cook at home, eat more fresh fruits and vegetables, and eat less fast food, canned, processed, or  prepared foods. This information is not intended to replace advice given to you by your health care provider. Make sure you discuss any questions you have with your health care provider. Document Revised: 07/26/2017 Document Reviewed: 08/06/2016 Elsevier Patient Education  2020  [employer].

## 2019-12-23 ENCOUNTER — Telehealth: Payer: Self-pay | Admitting: Internal Medicine

## 2019-12-23 LAB — PRO B NATRIURETIC PEPTIDE: NT-Pro BNP: 35 pg/mL (ref 0–301)

## 2019-12-23 NOTE — Telephone Encounter (Signed)
Pt requested to schedule a recall colonoscopy.  She stated that she is on O2 and had to have a hospital colon last time.

## 2019-12-25 ENCOUNTER — Other Ambulatory Visit: Payer: Self-pay | Admitting: Family Medicine

## 2019-12-30 DIAGNOSIS — G4733 Obstructive sleep apnea (adult) (pediatric): Secondary | ICD-10-CM | POA: Diagnosis not present

## 2019-12-31 ENCOUNTER — Other Ambulatory Visit: Payer: Self-pay

## 2019-12-31 ENCOUNTER — Other Ambulatory Visit (HOSPITAL_COMMUNITY): Payer: Self-pay | Admitting: Internal Medicine

## 2019-12-31 DIAGNOSIS — Z1211 Encounter for screening for malignant neoplasm of colon: Secondary | ICD-10-CM

## 2019-12-31 NOTE — Telephone Encounter (Signed)
Pt scheduled for previsit 6/28@1pm , covid test at [facility] 7/20@9 :30am, colon scheduled at [facility] 03/22/20@10 :15am. Left message for pt to call back regarding appts.

## 2020-01-01 NOTE — Telephone Encounter (Signed)
Spoke with pt and she is aware of appts. 

## 2020-01-02 DIAGNOSIS — J9610 Chronic respiratory failure, unspecified whether with hypoxia or hypercapnia: Secondary | ICD-10-CM | POA: Diagnosis not present

## 2020-01-05 ENCOUNTER — Other Ambulatory Visit
Admission: RE | Admit: 2020-01-05 | Discharge: 2020-01-05 | Disposition: A | Discharge status: Home / Self Care | Payer: Medicare PPO | Source: Ambulatory Visit | Attending: Critical Care Medicine | Admitting: Critical Care Medicine

## 2020-01-05 DIAGNOSIS — Z20822 Contact with and (suspected) exposure to covid-19: Secondary | ICD-10-CM | POA: Diagnosis not present

## 2020-01-05 DIAGNOSIS — Z01812 Encounter for preprocedural laboratory examination: Secondary | ICD-10-CM | POA: Insufficient documentation

## 2020-01-05 LAB — SARS CORONAVIRUS 2 (TAT 6-24 HRS): SARS Coronavirus 2: NEGATIVE

## 2020-01-06 ENCOUNTER — Other Ambulatory Visit: Payer: Self-pay | Admitting: *Deleted

## 2020-01-06 DIAGNOSIS — J9611 Chronic respiratory failure with hypoxia: Secondary | ICD-10-CM

## 2020-01-06 DIAGNOSIS — J984 Other disorders of lung: Secondary | ICD-10-CM

## 2020-01-07 ENCOUNTER — Ambulatory Visit (INDEPENDENT_AMBULATORY_CARE_PROVIDER_SITE_OTHER): Payer: Medicare PPO | Admitting: Critical Care Medicine

## 2020-01-07 ENCOUNTER — Other Ambulatory Visit: Payer: Self-pay

## 2020-01-07 DIAGNOSIS — J9611 Chronic respiratory failure with hypoxia: Secondary | ICD-10-CM

## 2020-01-07 DIAGNOSIS — J984 Other disorders of lung: Secondary | ICD-10-CM

## 2020-01-07 LAB — PULMONARY FUNCTION TEST
DL/VA % pred: 176 %
DL/VA: 7.23 ml/min/mmHg/L
DLCO cor % pred: 96 %
DLCO cor: 19.28 ml/min/mmHg
DLCO unc % pred: 96 %
DLCO unc: 19.28 ml/min/mmHg
FEF 25-75 Post: 0.9 L/sec
FEF 25-75 Pre: 1.66 L/sec
FEF2575-%Change-Post: -45 %
FEF2575-%Pred-Post: 48 %
FEF2575-%Pred-Pre: 89 %
FEV1-%Change-Post: -10 %
FEV1-%Pred-Post: 49 %
FEV1-%Pred-Pre: 55 %
FEV1-Post: 1.14 L
FEV1-Pre: 1.28 L
FEV1FVC-%Change-Post: 0 %
FEV1FVC-%Pred-Pre: 113 %
FEV6-%Change-Post: -9 %
FEV6-%Pred-Post: 45 %
FEV6-%Pred-Pre: 50 %
FEV6-Post: 1.33 L
FEV6-Pre: 1.48 L
FEV6FVC-%Pred-Post: 104 %
FEV6FVC-%Pred-Pre: 104 %
FVC-%Change-Post: -10 %
FVC-%Pred-Post: 43 %
FVC-%Pred-Pre: 49 %
FVC-Post: 1.33 L
FVC-Pre: 1.5 L
Post FEV1/FVC ratio: 86 %
Post FEV6/FVC ratio: 100 %
Pre FEV1/FVC ratio: 86 %
Pre FEV6/FVC Ratio: 100 %
RV % pred: 137 %
RV: 3.12 L
TLC % pred: 88 %
TLC: 4.61 L

## 2020-01-07 NOTE — Progress Notes (Signed)
Full PFT performed today. °

## 2020-01-08 ENCOUNTER — Telehealth: Payer: Self-pay | Admitting: Primary Care

## 2020-01-08 NOTE — Telephone Encounter (Signed)
Please make sure patient has a follow-up scheduled either Dr. Carlis Abbott or Shearon Stalls to establish as new patient in June/July.

## 2020-01-15 ENCOUNTER — Ambulatory Visit: Payer: Medicare PPO | Admitting: Family Medicine

## 2020-01-15 ENCOUNTER — Encounter: Payer: Self-pay | Admitting: Family Medicine

## 2020-01-15 ENCOUNTER — Other Ambulatory Visit: Payer: Self-pay

## 2020-01-15 DIAGNOSIS — J9611 Chronic respiratory failure with hypoxia: Secondary | ICD-10-CM | POA: Diagnosis not present

## 2020-01-15 DIAGNOSIS — E1121 Type 2 diabetes mellitus with diabetic nephropathy: Secondary | ICD-10-CM | POA: Diagnosis not present

## 2020-01-15 DIAGNOSIS — I272 Pulmonary hypertension, unspecified: Secondary | ICD-10-CM | POA: Diagnosis not present

## 2020-01-15 DIAGNOSIS — I5032 Chronic diastolic (congestive) heart failure: Secondary | ICD-10-CM | POA: Diagnosis not present

## 2020-01-15 NOTE — Patient Instructions (Signed)
Nice to see you. Please start wearing your oxygen consistently. Please monitor for any increased swelling or shortness of breath.  You could increase the days of the week that you are taking the torsemide if those things occur.

## 2020-01-15 NOTE — Assessment & Plan Note (Signed)
Encouraged continued use of torsemide twice weekly as that is all she can tolerate.  Discussed increasing to another day per week if she has retained fluid.

## 2020-01-15 NOTE — Assessment & Plan Note (Signed)
She will remain on the Metformin.  We will have her back in 2 months for labs

## 2020-01-15 NOTE — Assessment & Plan Note (Signed)
I encouraged patient to use her oxygen to prevent this from worsening.

## 2020-01-15 NOTE — Progress Notes (Signed)
Tommi Rumps, MD Phone: [phone]  Betty [name] is a 74 y.o. female who presents today for f/u.  Chronic dyspnea on exertion: This is multifactorial.  She does note it has improved with use of torsemide.  She saw cardiology and they recommended she use this daily though she notes it dries her out too much and she is only taking it twice weekly which seems to be beneficial.  Her edema has improved.  She feels lighter in her chest.  No chest pain.  No cough.  Rare wheezing.  She does have pulmonary fibrosis.  Cardiology also recommended that she use her oxygen daily so that her pulmonary hypertension does not worsen.  She has not started wearing her oxygen consistently.  Diabetes: Taking Metformin.  She is not had any issues with this.  No polyuria or polydipsia.  Social History   Tobacco Use  Smoking Status Never Smoker  Smokeless Tobacco Never Used     ROS see history of present illness  Objective  Physical Exam Vitals:   01/15/20 1104  BP: 140/80  Pulse: 96  Temp: (!) 97 F (36.1 C)  SpO2: 92%    BP Readings from Last 3 Encounters:  01/15/20 140/80  12/22/19 120/70  12/03/19 120/70   Wt Readings from Last 3 Encounters:  01/15/20 205 lb 9.6 oz (93.3 kg)  12/22/19 206 lb (93.4 kg)  12/03/19 207 lb (93.9 kg)    Physical Exam Constitutional:      General: She is not in acute distress.    Appearance: She is not diaphoretic.  Cardiovascular:     Rate and Rhythm: Normal rate and regular rhythm.     Heart sounds: Normal heart sounds.  Pulmonary:     Effort: Pulmonary effort is normal.     Breath sounds: Normal breath sounds.  Musculoskeletal:     Right lower leg: No edema.     Left lower leg: No edema.  Skin:    General: Skin is warm and dry.  Neurological:     Mental Status: She is alert.    Diabetic Foot Exam - Simple   Simple Foot Form Diabetic Foot exam was performed with the following findings: Yes 01/15/2020 11:24 AM  Visual Inspection No  deformities, no ulcerations, no other skin breakdown bilaterally: Yes Sensation Testing See comments: Yes Pulse Check Posterior Tibialis and Dorsalis pulse intact bilaterally: Yes Comments Decreased monofilament testing sensation in her forefoot, intact in her heel, light touch sensation intact      Assessment/Plan: Please see individual problem list.  Pulmonary HTN ([place]) I encouraged patient to use her oxygen to prevent this from worsening.  Chronic diastolic heart failure ([place]) Encouraged continued use of torsemide twice weekly as that is all she can tolerate.  Discussed increasing to another day per week if she has retained fluid.  Chronic hypoxemic respiratory failure ([place]) Encourage patient to wear her oxygen.  Diabetes mellitus type 2, controlled ([place]) She will remain on the Metformin.  We will have her back in 2 months for labs    No orders of the defined types were placed in this encounter.   No orders of the defined types were placed in this encounter.   This visit occurred during the SARS-CoV-2 public health emergency.  Safety protocols were in place, including screening questions prior to the visit, additional usage of staff PPE, and extensive cleaning of exam room while observing appropriate contact time as indicated for disinfecting solutions.    Tommi Rumps, MD [place]  [place]

## 2020-01-15 NOTE — Telephone Encounter (Signed)
Patient has a recall for her to see Dr.Clark. Nothing else further needed.

## 2020-01-15 NOTE — Assessment & Plan Note (Signed)
Encourage patient to wear her oxygen.

## 2020-01-23 ENCOUNTER — Other Ambulatory Visit: Payer: Self-pay | Admitting: Family Medicine

## 2020-01-26 ENCOUNTER — Other Ambulatory Visit: Payer: Self-pay | Admitting: Family Medicine

## 2020-01-26 DIAGNOSIS — Z1231 Encounter for screening mammogram for malignant neoplasm of breast: Secondary | ICD-10-CM

## 2020-02-02 DIAGNOSIS — J9610 Chronic respiratory failure, unspecified whether with hypoxia or hypercapnia: Secondary | ICD-10-CM | POA: Diagnosis not present

## 2020-02-04 ENCOUNTER — Encounter: Payer: Medicare PPO | Admitting: Internal Medicine

## 2020-02-16 ENCOUNTER — Telehealth: Payer: Self-pay | Admitting: Critical Care Medicine

## 2020-02-16 NOTE — Telephone Encounter (Signed)
Patient wants someone to review her PFT results and call her. Please advise.

## 2020-02-16 NOTE — Telephone Encounter (Signed)
I reviewed her PFTs. They do not fit a pattern that I would expect based on previous notes, and I have never seen this patient before so it is difficult to interpret them without ever seeing her before. They would fit a pattern closer to obstructive lung disease rather than restriction or fibrosis related lung disease. I can discuss further when I see her in clinic when she comes to establish care with me.  Julian Hy, DO 02/16/20 7:40 PM [facility]

## 2020-02-17 NOTE — Telephone Encounter (Signed)
ATC pt, received a fast busy signal x2. Will try back. 

## 2020-02-18 NOTE — Telephone Encounter (Signed)
Spoke with pt. She is aware of Dr. Ainsley Spinner response. A recall has been placed for the pt's appointment since Dr. Carlis Abbott does not have any available appointments at this time.

## 2020-02-21 ENCOUNTER — Other Ambulatory Visit: Payer: Self-pay | Admitting: Family Medicine

## 2020-02-22 ENCOUNTER — Ambulatory Visit (AMBULATORY_SURGERY_CENTER): Payer: Self-pay

## 2020-02-22 ENCOUNTER — Other Ambulatory Visit: Payer: Self-pay

## 2020-02-22 VITALS — Ht 65.0 in | Wt 203.8 lb

## 2020-02-22 DIAGNOSIS — Z86010 Personal history of colonic polyps: Secondary | ICD-10-CM

## 2020-02-22 MED ORDER — SUTAB 1479-225-188 MG PO TABS: 12.0000 | ORAL_TABLET | ORAL | 0 refills | Status: DC

## 2020-02-22 NOTE — Progress Notes (Signed)
No egg or soy allergy known to patient  No issues with past sedation with any surgeries  or procedures, no intubation problems  No diet pills per patient No home 02 use per patient  No blood thinners per patient  Pt denies issues with constipation  No A fib or A flutter  EMMI video sent to pt's e mail  COVID 19 guidelines implemented in [place] today   Colon sched at [facility]   Due to the COVID-19 pandemic we are asking patients to follow these guidelines. Please only bring one care partner. Please be aware that your care partner may wait in the car in the parking lot or if they feel like they will be too hot to wait in the car, they may wait in the lobby on the 4th floor. All care partners are required to wear a mask the entire time (we do not have any that we can provide them), they need to practice social distancing, and we will do a Covid check for all patient's and care partners when you arrive. Also we will check their temperature and your temperature. If the care partner waits in their car they need to stay in the parking lot the entire time and we will call them on their cell phone when the patient is ready for discharge so they can bring the car to the front of the building. Also all patient's will need to wear a mask into building.

## 2020-03-02 ENCOUNTER — Other Ambulatory Visit: Payer: Self-pay | Admitting: Internal Medicine

## 2020-03-02 DIAGNOSIS — Z86010 Personal history of colonic polyps: Secondary | ICD-10-CM

## 2020-03-03 ENCOUNTER — Ambulatory Visit
Admission: RE | Admit: 2020-03-03 | Discharge: 2020-03-03 | Disposition: A | Discharge status: Home / Self Care | Payer: Medicare PPO | Source: Ambulatory Visit | Attending: Family Medicine | Admitting: Family Medicine

## 2020-03-03 DIAGNOSIS — Z1231 Encounter for screening mammogram for malignant neoplasm of breast: Secondary | ICD-10-CM | POA: Insufficient documentation

## 2020-03-03 DIAGNOSIS — J9610 Chronic respiratory failure, unspecified whether with hypoxia or hypercapnia: Secondary | ICD-10-CM | POA: Diagnosis not present

## 2020-03-15 ENCOUNTER — Other Ambulatory Visit (HOSPITAL_COMMUNITY): Payer: Medicare PPO

## 2020-03-16 ENCOUNTER — Ambulatory Visit: Payer: Medicare PPO | Admitting: Family Medicine

## 2020-03-18 ENCOUNTER — Other Ambulatory Visit (HOSPITAL_COMMUNITY)
Admission: RE | Admit: 2020-03-18 | Discharge: 2020-03-18 | Disposition: A | Discharge status: Home / Self Care | Payer: Medicare PPO | Source: Ambulatory Visit | Attending: Internal Medicine | Admitting: Internal Medicine

## 2020-03-18 DIAGNOSIS — Z20822 Contact with and (suspected) exposure to covid-19: Secondary | ICD-10-CM | POA: Diagnosis not present

## 2020-03-18 DIAGNOSIS — Z01812 Encounter for preprocedural laboratory examination: Secondary | ICD-10-CM | POA: Insufficient documentation

## 2020-03-18 LAB — SARS CORONAVIRUS 2 (TAT 6-24 HRS): SARS Coronavirus 2: NEGATIVE

## 2020-03-22 ENCOUNTER — Encounter (HOSPITAL_COMMUNITY): Payer: Self-pay | Admitting: Internal Medicine

## 2020-03-22 ENCOUNTER — Ambulatory Visit (HOSPITAL_COMMUNITY)
Admission: RE | Admit: 2020-03-22 | Discharge: 2020-03-22 | Disposition: A | Discharge status: Home / Self Care | Payer: Medicare PPO | Attending: Internal Medicine | Admitting: Internal Medicine

## 2020-03-22 ENCOUNTER — Ambulatory Visit (HOSPITAL_COMMUNITY): Payer: Medicare PPO | Admitting: Registered Nurse

## 2020-03-22 ENCOUNTER — Other Ambulatory Visit: Payer: Self-pay

## 2020-03-22 ENCOUNTER — Encounter (HOSPITAL_COMMUNITY)
Admission: RE | Disposition: A | Discharge status: Home / Self Care | Payer: Self-pay | Source: Home / Self Care | Attending: Internal Medicine

## 2020-03-22 DIAGNOSIS — Z818 Family history of other mental and behavioral disorders: Secondary | ICD-10-CM | POA: Insufficient documentation

## 2020-03-22 DIAGNOSIS — E669 Obesity, unspecified: Secondary | ICD-10-CM | POA: Insufficient documentation

## 2020-03-22 DIAGNOSIS — Z6834 Body mass index (BMI) 34.0-34.9, adult: Secondary | ICD-10-CM | POA: Diagnosis not present

## 2020-03-22 DIAGNOSIS — Z825 Family history of asthma and other chronic lower respiratory diseases: Secondary | ICD-10-CM | POA: Insufficient documentation

## 2020-03-22 DIAGNOSIS — Z8349 Family history of other endocrine, nutritional and metabolic diseases: Secondary | ICD-10-CM | POA: Insufficient documentation

## 2020-03-22 DIAGNOSIS — D125 Benign neoplasm of sigmoid colon: Secondary | ICD-10-CM

## 2020-03-22 DIAGNOSIS — M549 Dorsalgia, unspecified: Secondary | ICD-10-CM | POA: Diagnosis not present

## 2020-03-22 DIAGNOSIS — D122 Benign neoplasm of ascending colon: Secondary | ICD-10-CM | POA: Insufficient documentation

## 2020-03-22 DIAGNOSIS — M170 Bilateral primary osteoarthritis of knee: Secondary | ICD-10-CM | POA: Insufficient documentation

## 2020-03-22 DIAGNOSIS — K219 Gastro-esophageal reflux disease without esophagitis: Secondary | ICD-10-CM | POA: Insufficient documentation

## 2020-03-22 DIAGNOSIS — E785 Hyperlipidemia, unspecified: Secondary | ICD-10-CM | POA: Diagnosis not present

## 2020-03-22 DIAGNOSIS — E1136 Type 2 diabetes mellitus with diabetic cataract: Secondary | ICD-10-CM | POA: Insufficient documentation

## 2020-03-22 DIAGNOSIS — R0609 Other forms of dyspnea: Secondary | ICD-10-CM | POA: Diagnosis not present

## 2020-03-22 DIAGNOSIS — Z86010 Personal history of colonic polyps: Secondary | ICD-10-CM | POA: Diagnosis not present

## 2020-03-22 DIAGNOSIS — K635 Polyp of colon: Secondary | ICD-10-CM | POA: Diagnosis not present

## 2020-03-22 DIAGNOSIS — Z8249 Family history of ischemic heart disease and other diseases of the circulatory system: Secondary | ICD-10-CM | POA: Insufficient documentation

## 2020-03-22 DIAGNOSIS — Z9981 Dependence on supplemental oxygen: Secondary | ICD-10-CM | POA: Insufficient documentation

## 2020-03-22 DIAGNOSIS — G4730 Sleep apnea, unspecified: Secondary | ICD-10-CM | POA: Insufficient documentation

## 2020-03-22 DIAGNOSIS — Z833 Family history of diabetes mellitus: Secondary | ICD-10-CM | POA: Insufficient documentation

## 2020-03-22 DIAGNOSIS — I110 Hypertensive heart disease with heart failure: Secondary | ICD-10-CM | POA: Insufficient documentation

## 2020-03-22 DIAGNOSIS — K5730 Diverticulosis of large intestine without perforation or abscess without bleeding: Secondary | ICD-10-CM | POA: Insufficient documentation

## 2020-03-22 DIAGNOSIS — Z85828 Personal history of other malignant neoplasm of skin: Secondary | ICD-10-CM | POA: Insufficient documentation

## 2020-03-22 DIAGNOSIS — Z9049 Acquired absence of other specified parts of digestive tract: Secondary | ICD-10-CM | POA: Insufficient documentation

## 2020-03-22 DIAGNOSIS — Z888 Allergy status to other drugs, medicaments and biological substances status: Secondary | ICD-10-CM | POA: Insufficient documentation

## 2020-03-22 DIAGNOSIS — M19071 Primary osteoarthritis, right ankle and foot: Secondary | ICD-10-CM | POA: Insufficient documentation

## 2020-03-22 DIAGNOSIS — Z1211 Encounter for screening for malignant neoplasm of colon: Secondary | ICD-10-CM | POA: Diagnosis not present

## 2020-03-22 DIAGNOSIS — I428 Other cardiomyopathies: Secondary | ICD-10-CM | POA: Insufficient documentation

## 2020-03-22 DIAGNOSIS — Z823 Family history of stroke: Secondary | ICD-10-CM | POA: Insufficient documentation

## 2020-03-22 DIAGNOSIS — I5032 Chronic diastolic (congestive) heart failure: Secondary | ICD-10-CM | POA: Diagnosis not present

## 2020-03-22 DIAGNOSIS — Z882 Allergy status to sulfonamides status: Secondary | ICD-10-CM | POA: Insufficient documentation

## 2020-03-22 DIAGNOSIS — M19042 Primary osteoarthritis, left hand: Secondary | ICD-10-CM | POA: Diagnosis not present

## 2020-03-22 DIAGNOSIS — I272 Pulmonary hypertension, unspecified: Secondary | ICD-10-CM | POA: Diagnosis not present

## 2020-03-22 DIAGNOSIS — G8929 Other chronic pain: Secondary | ICD-10-CM | POA: Diagnosis not present

## 2020-03-22 DIAGNOSIS — M19072 Primary osteoarthritis, left ankle and foot: Secondary | ICD-10-CM | POA: Insufficient documentation

## 2020-03-22 DIAGNOSIS — M19041 Primary osteoarthritis, right hand: Secondary | ICD-10-CM | POA: Insufficient documentation

## 2020-03-22 DIAGNOSIS — Z7984 Long term (current) use of oral hypoglycemic drugs: Secondary | ICD-10-CM | POA: Insufficient documentation

## 2020-03-22 DIAGNOSIS — Z880 Allergy status to penicillin: Secondary | ICD-10-CM | POA: Insufficient documentation

## 2020-03-22 DIAGNOSIS — Z881 Allergy status to other antibiotic agents status: Secondary | ICD-10-CM | POA: Insufficient documentation

## 2020-03-22 DIAGNOSIS — Z8052 Family history of malignant neoplasm of bladder: Secondary | ICD-10-CM | POA: Insufficient documentation

## 2020-03-22 DIAGNOSIS — Z807 Family history of other malignant neoplasms of lymphoid, hematopoietic and related tissues: Secondary | ICD-10-CM | POA: Insufficient documentation

## 2020-03-22 DIAGNOSIS — Z806 Family history of leukemia: Secondary | ICD-10-CM | POA: Insufficient documentation

## 2020-03-22 HISTORY — PX: POLYPECTOMY: SHX5525

## 2020-03-22 HISTORY — PX: COLONOSCOPY WITH PROPOFOL: SHX5780

## 2020-03-22 LAB — GLUCOSE, CAPILLARY: Glucose-Capillary: 99 mg/dL (ref 70–99)

## 2020-03-22 SURGERY — COLONOSCOPY WITH PROPOFOL
Anesthesia: Monitor Anesthesia Care

## 2020-03-22 MED ORDER — LACTATED RINGERS IV SOLN
INTRAVENOUS | Status: AC | PRN
  Administered 2020-03-22: 1000 mL via INTRAVENOUS

## 2020-03-22 MED ORDER — ONDANSETRON HCL 4 MG/2ML IJ SOLN
INTRAMUSCULAR | Status: DC | PRN
  Administered 2020-03-22: 4 mg via INTRAVENOUS

## 2020-03-22 MED ORDER — LIDOCAINE 2% (20 MG/ML) 5 ML SYRINGE
INTRAMUSCULAR | Status: DC | PRN
  Administered 2020-03-22: 40 mg via INTRAVENOUS

## 2020-03-22 MED ORDER — PROPOFOL 1000 MG/100ML IV EMUL
INTRAVENOUS | Status: AC
  Filled 2020-03-22: qty 100

## 2020-03-22 MED ORDER — PROPOFOL 500 MG/50ML IV EMUL
INTRAVENOUS | Status: DC | PRN
  Administered 2020-03-22: 140 ug/kg/min via INTRAVENOUS

## 2020-03-22 SURGICAL SUPPLY — 22 items

## 2020-03-22 NOTE — Op Note (Signed)
[facility] Patient Name: Betty [name] Procedure Date: 03/22/2020 MRN: [id number] Attending MD: [name] , MD Date of Birth: [birth year] CSN: [id number] Age: 74 Admit Type: Inpatient Procedure:                Colonoscopy Indications:              High risk colon cancer surveillance: Personal                            history of non-advanced adenomas (last colonoscopy                            April 2016 with 2 adenomas, and March 2013 with 4                            adenomas) Providers:                Lajuan Lines. Hilarie Fredrickson, MD, Cleda Daub, RN, Corie Chiquito,                            Technician, Courtney Heys. Arm[place], CRNA Referring MD:             Angela Adam. Sonn[place] Medicines:                Monitored Anesthesia Care Complications:            No immediate complications. Estimated Blood Loss:     Estimated blood loss was minimal. Procedure:                Pre-Anesthesia Assessment:                           - Prior to the procedure, a History and Physical                            was performed, and patient medications and                            allergies were reviewed. The patient's tolerance of                            previous anesthesia was also reviewed. The risks                            and benefits of the procedure and the sedation                            options and risks were discussed with the patient.                            All questions were answered, and informed consent                            was obtained. Prior Anticoagulants: The patient has  taken no previous anticoagulant or antiplatelet                            agents. ASA Grade Assessment: III - A patient with                            severe systemic disease. After reviewing the risks                            and benefits, the patient was deemed in                            satisfactory condition to undergo the procedure.                            After obtaining informed consent, the colonoscope                            was passed under direct vision. Throughout the                            procedure, the patient's blood pressure, pulse, and                            oxygen saturations were monitored continuously. The                            CF-HQ190L ([id number]) Olympus colonoscope was                            introduced through the anus and advanced to the                            cecum, identified by appendiceal orifice and                            ileocecal valve. The colonoscopy was performed                            without difficulty. The patient tolerated the                            procedure well. The quality of the bowel                            preparation was good. The ileocecal valve,                            appendiceal orifice, and rectum were photographed. Scope In: 11:31:36 AM Scope Out: 11:48:21 AM Scope Withdrawal Time: 0 hours 11 minutes 57 seconds  Total Procedure Duration: 0 hours 16 minutes 45 seconds  Findings:      The digital rectal exam was normal.      A 3 mm polyp was found in the ascending colon. The polyp  was sessile.       The polyp was removed with a cold snare. Resection and retrieval were       complete.      A 4 mm polyp was found in the distal sigmoid colon. The polyp was       sessile. The polyp was removed with a cold snare. Resection and       retrieval were complete.      Multiple small-mouthed diverticula were found in the sigmoid colon,       descending colon and ascending colon.      The retroflexed view of the distal rectum and anal verge was normal and       showed no anal or rectal abnormalities. Impression:               - One 3 mm polyp in the ascending colon, removed                            with a cold snare. Resected and retrieved.                           - One 4 mm polyp in the distal sigmoid colon,                            removed with a cold  snare. Resected and retrieved.                           - Diverticulosis in the sigmoid colon, in the                            descending colon and in the ascending colon.                           - The distal rectum and anal verge are normal on                            retroflexion view. Moderate Sedation:      N/A Recommendation:           - Patient has a contact number available for                            emergencies. The signs and symptoms of potential                            delayed complications were discussed with the                            patient. Return to normal activities tomorrow.                            Written discharge instructions were provided to the                            patient.                           -  Resume previous diet.                           - Continue present medications.                           - Await pathology results.                           - No recommendation at this time regarding repeat                            colonoscopy as next surveillance interval would be                            7 years at which point she will be 74 years old. Procedure Code(s):        --- Professional ---                           [phone], Colonoscopy, flexible; with removal of                            tumor(s), polyp(s), or other lesion(s) by snare                            technique Diagnosis Code(s):        --- Professional ---                           Z86.010, Personal history of colonic polyps                           K63.5, Polyp of colon                           K57.30, Diverticulosis of large intestine without                            perforation or abscess without bleeding CPT copyright 2019 [employer] Association. All rights reserved. The codes documented in this report are preliminary and upon coder review may  be revised to meet current compliance requirements. [name], MD 03/22/2020 11:52:44 AM This report  has been signed electronically. Number of Addenda: 0

## 2020-03-22 NOTE — Anesthesia Preprocedure Evaluation (Addendum)
Anesthesia Evaluation  Patient identified by MRN, date of birth, ID band Patient awake    Reviewed: Allergy & Precautions, NPO status , Patient's Chart, lab work & pertinent test results, reviewed documented beta blocker date and time   History of Anesthesia Complications (+) PONV  Airway Mallampati: IV  TM Distance: >3 FB Neck ROM: Full    Dental  (+) Missing, Caps, Dental Advisory Given   Pulmonary sleep apnea, Continuous Positive Airway Pressure Ventilation and Oxygen sleep apnea ,  03/18/2020 SARS coronavirus NEG   breath sounds clear to auscultation       Cardiovascular hypertension, Pt. on medications and Pt. on home beta blockers (-) angina+ DOE   Rhythm:Regular Rate:Normal  '19 ECHO: EF 55-60%, valves OK   Neuro/Psych Chronic back pain    GI/Hepatic Neg liver ROS, GERD  Controlled,  Endo/Other  diabetes, Oral Hypoglycemic Agents  Renal/GU negative Renal ROS     Musculoskeletal   Abdominal (+) + obese,   Peds  Hematology negative hematology ROS (+)   Anesthesia Other Findings   Reproductive/Obstetrics                            Anesthesia Physical Anesthesia Plan  ASA: III  Anesthesia Plan: MAC   Post-op Pain Management:    Induction:   PONV Risk Score and Plan: 3 and Treatment may vary due to age or medical condition and Ondansetron  Airway Management Planned: Natural Airway and Simple Face Mask  Additional Equipment: None  Intra-op Plan:   Post-operative Plan:   Informed Consent: I have reviewed the patients History and Physical, chart, labs and discussed the procedure including the risks, benefits and alternatives for the proposed anesthesia with the patient or authorized representative who has indicated his/her understanding and acceptance.     Dental advisory given  Plan Discussed with: CRNA and Surgeon  Anesthesia Plan Comments:        Anesthesia  Quick Evaluation

## 2020-03-22 NOTE — Anesthesia Postprocedure Evaluation (Signed)
Anesthesia Post Note  Patient: Betty [name]  Procedure(s) Performed: COLONOSCOPY WITH PROPOFOL (N/A ) POLYPECTOMY     Patient location during evaluation: Endoscopy Anesthesia Type: MAC Level of consciousness: awake and alert, patient cooperative and oriented Pain management: pain level controlled Vital Signs Assessment: post-procedure vital signs reviewed and stable Respiratory status: spontaneous breathing, nonlabored ventilation and respiratory function stable Cardiovascular status: blood pressure returned to baseline and stable Postop Assessment: no apparent nausea or vomiting Anesthetic complications: no   No complications documented.  Last Vitals:  Vitals:   03/22/20 1210 03/22/20 1220  BP: (!) 143/62 (!) 139/92  Pulse: 84 87  Resp: 20 18  Temp:    SpO2: 98% 93%    Last Pain:  Vitals:   03/22/20 1220  TempSrc:   PainSc: 0-No pain                 Shambhavi Salley,E. Ronnetta Currington

## 2020-03-22 NOTE — H&P (Signed)
HPI: Betty [name] is a 74 year old female with a past medical history of adenomatous colon polyps, colonic diverticulosis, hypertension, hyperlipidemia, diabetes, pulmonary hypertension who presents for outpatient surveillance colonoscopy.  She reports no specific GI complaints today.  She has been doing well from a GI perspective.  Last colonoscopy April 2016 2 subcentimeter adenomas removed and left-sided diverticulosis.  4 tubular adenomas removed in March 2013.  She continues to use oxygen at night with her CPAP.  She will occasionally use oxygen via nasal cannula during the day if she is exerting herself more than normal.  Past Medical History:  Diagnosis Date   Allergic rhinitis    never tested, fall and spring   Arthritis    hands,knees, feet   Cataracts, bilateral    not surgical yet   CHOLECYSTECTOMY, HX OF 10/08/2007   Qualifier: Diagnosis of  By: Council Mechanic MD, [name]    Chronic diastolic CHF (congestive heart failure) (HCC)    Chronic respiratory failure ([place])    Diabetes mellitus without complication ([place])    Diverticulosis    problems with frequent gas, burping   Glucose intolerance (impaired glucose tolerance)    Gout    History of chicken pox    Hyperlipidemia    Hypertension    NICM (nonischemic cardiomyopathy) ([place]) 2002   EF 25%; improved to normal - echo 4/08: EF 50-55%, mild MR, mild LAE, mild TR;    cath 3/03: normal cors, EF 40%   Obesity    Oxygen deficiency 2017   at night   PONV (postoperative nausea and vomiting)    Restrictive lung disease    Skin cancer    skin cancers only   Sleep apnea    cpap settings 4    Past Surgical History:  Procedure Laterality Date   BILATERAL VATS ABLATION Right    biopsy done 2015- [facility]   BREAST EXCISIONAL BIOPSY Left 80's   NEG   CARDIAC CATHETERIZATION     CHOLECYSTECTOMY     choleystectomy  02/2002   COLONOSCOPY WITH PROPOFOL N/A 12/20/2014   Procedure: COLONOSCOPY WITH  PROPOFOL;  Surgeon: [name], MD;  Location: [facility] ENDOSCOPY;  Service: Gastroenterology;  Laterality: N/A;   CYSTECTOMY  1996   l breast   DILATION AND CURETTAGE OF UTERUS  2011   Dr. Hulan Fray at [place]     x 2   [place]     x2    (Not in an outpatient encounter)   Allergies  Allergen Reactions   Amoxicillin Rash    REACTION: RASH   Etodolac Other (See Comments)    Bloody stool    Hydrochlorothiazide Other (See Comments)    Increases gout flare Increases gout flare   Meloxicam Other (See Comments)    constipation Constipation   Sulfa Antibiotics Other (See Comments)    rash   Allopurinol    Metformin And Related     diarrhea   Rosuvastatin Other (See Comments)    "extreme joint pain"   Sulfonamide Derivatives     REACTION: RASH   Jardiance [Empagliflozin] Other (See Comments)    Yeast infection   Lasix [Furosemide] Other (See Comments)    Pt states "it dried everything out of me"    Family History  Problem Relation Age of Onset   Lymphoma Mother 62   COPD Father        was a smoker  Stroke Father    Pneumonia Father    Diabetes Father    Hyperlipidemia Father    Heart disease Father    Heart attack Father    Hypertension Father    Diabetes Sister    Depression Sister    Meniere's disease Brother    Diabetes Paternal Grandfather    Heart disease Paternal Grandfather    Schizophrenia Daughter    Bladder Cancer Maternal Grandmother    Leukemia Maternal Grandfather    Hypertension Brother    Colon cancer Neg Hx    Stomach cancer Neg Hx    Breast cancer Neg Hx    Colon polyps Neg Hx    Esophageal cancer Neg Hx    Rectal cancer Neg Hx     Social History   Tobacco Use   Smoking status: Never Smoker   Smokeless tobacco: Never Used  Vaping Use   Vaping Use: Never used  Substance Use Topics   Alcohol use: No    Alcohol/week: 0.0  standard drinks   Drug use: No    ROS: As per history of present illness, otherwise negative  BP (!) 155/79    Pulse 81    Temp 98 F (36.7 C) (Oral)    Resp (!) 24    Ht 5\' 5"  (1.651 m)    Wt (!) 93 kg    SpO2 93%    BMI 34.11 kg/m  Gen: awake, alert, NAD HEENT: anicteric, op clear CV: RRR Pulm: CTA b/l Abd: soft, NT/ND, +BS throughout Ext: no c/c/e Neuro: nonfocal  RELEVANT LABS AND IMAGING: CBC    Component Value Date/Time   WBC 8.4 10/19/2019 1214   RBC 4.46 10/19/2019 1214   HGB 14.5 10/19/2019 1214   HCT 43.3 10/19/2019 1214   PLT 229 10/19/2019 1214   MCV 97.1 10/19/2019 1214   MCH 32.5 10/19/2019 1214   MCHC 33.5 10/19/2019 1214   RDW 12.8 10/19/2019 1214   LYMPHSABS 2.3 01/27/2016 1024   MONOABS 0.7 01/27/2016 1024   EOSABS 0.1 01/27/2016 1024   BASOSABS 0.0 01/27/2016 1024    CMP     Component Value Date/Time   NA 138 12/14/2019 0933   NA 139 03/09/2019 1058   K 3.9 12/14/2019 0933   CL 100 12/14/2019 0933   CO2 33 (H) 12/14/2019 0933   GLUCOSE 154 (H) 12/14/2019 0933   BUN 15 12/14/2019 0933   BUN 16 03/09/2019 1058   CREATININE 0.54 12/14/2019 0933   CREATININE 0.64 01/08/2014 1657   CALCIUM 9.4 12/14/2019 0933   PROT 7.5 10/19/2019 1214   ALBUMIN 3.8 10/19/2019 1214   AST 25 10/19/2019 1214   ALT 22 10/19/2019 1214   ALKPHOS 67 10/19/2019 1214   BILITOT 0.6 10/19/2019 1214   GFRNONAA >60 10/19/2019 1214   GFRNONAA >89 01/08/2014 1657   GFRAA >60 10/19/2019 1214   GFRAA >89 01/08/2014 1657    ASSESSMENT/PLAN: 74 year old female with a past medical history of adenomatous colon polyps, colonic diverticulosis, hypertension, hyperlipidemia, diabetes, pulmonary hypertension who presents for outpatient surveillance colonoscopy.  1.  Personal history of adenomatous colon polyps --surveillance colonoscopy today with monitored anesthesia care.  The nature of the procedure, as well as the risks, benefits, and alternatives were carefully and  thoroughly reviewed with the patient. Ample time for discussion and questions allowed. The patient understood, was satisfied, and agreed to proceed.

## 2020-03-22 NOTE — Discharge Instructions (Signed)

## 2020-03-22 NOTE — Transfer of Care (Signed)
Immediate Anesthesia Transfer of Care Note  Patient: Betty [name]  Procedure(s) Performed: COLONOSCOPY WITH PROPOFOL (N/A ) POLYPECTOMY  Patient Location: PACU and Endoscopy Unit  Anesthesia Type:MAC  Level of Consciousness: awake, alert , oriented and patient cooperative  Airway & Oxygen Therapy: Patient Spontanous Breathing and Patient connected to face mask oxygen  Post-op Assessment: Report given to RN, Post -op Vital signs reviewed and stable and Patient moving all extremities  Post vital signs: Reviewed and stable  Last Vitals:  Vitals Value Taken Time  BP    Temp    Pulse 87 03/22/20 1157  Resp 21 03/22/20 1157  SpO2 100 % 03/22/20 1157  Vitals shown include unvalidated device data.  Last Pain:  Vitals:   03/22/20 0946  TempSrc: Oral  PainSc: 0-No pain         Complications: No complications documented.

## 2020-03-23 LAB — SURGICAL PATHOLOGY

## 2020-03-24 ENCOUNTER — Encounter: Payer: Self-pay | Admitting: Internal Medicine

## 2020-03-30 DIAGNOSIS — G4733 Obstructive sleep apnea (adult) (pediatric): Secondary | ICD-10-CM | POA: Diagnosis not present

## 2020-04-02 ENCOUNTER — Other Ambulatory Visit: Payer: Self-pay | Admitting: Family Medicine

## 2020-04-03 DIAGNOSIS — J9610 Chronic respiratory failure, unspecified whether with hypoxia or hypercapnia: Secondary | ICD-10-CM | POA: Diagnosis not present

## 2020-04-12 ENCOUNTER — Ambulatory Visit (INDEPENDENT_AMBULATORY_CARE_PROVIDER_SITE_OTHER): Payer: Medicare PPO

## 2020-04-12 VITALS — BP 110/53 | HR 89 | Ht 65.0 in | Wt 205.0 lb

## 2020-04-12 DIAGNOSIS — Z0000 Encounter for general adult medical examination without abnormal findings: Secondary | ICD-10-CM

## 2020-04-12 NOTE — Patient Instructions (Addendum)
Betty [name] , Thank you for taking time to come for your Medicare Wellness Visit. I appreciate your ongoing commitment to your health goals. Please review the following plan we discussed and let me know if I can assist you in the future.   These are the goals we discussed: Goals    . Healthy Lifestyle     Stay active; chair exercises Healthy diet Appropriate fluid intake          This is a list of the screening recommended for you and due dates:  Health Maintenance  Topic Date Due  . Flu Shot  07/13/2020*  . Hemoglobin A1C  04/17/2020  . Eye exam for diabetics  05/17/2020  . Complete foot exam   01/14/2021  . Mammogram  03/03/2022  . Tetanus Vaccine  10/15/2023  . Colon Cancer Screening  03/22/2025  . DEXA scan (bone density measurement)  Completed  . COVID-19 Vaccine  Completed  .  Hepatitis C: One time screening is recommended by Center for Disease Control  (CDC) for  adults born from [birth year] through 1964.   Completed  . Pneumonia vaccines  Completed  *Topic was postponed. The date shown is not the original due date.    Immunizations Immunization History  Administered Date(s) Administered  . Fluad Quad(high Dose 65+) 04/30/2019  . H1N1 09/08/2008  . Influenza Split 06/28/2011, 05/01/2012  . Influenza Whole 07/11/2007, 06/07/2008, 07/12/2009  . Influenza, High Dose Seasonal PF 05/12/2018  . Influenza,inj,Quad PF,6+ Mos 04/23/2014, 04/21/2015  . Influenza-Unspecified 04/27/2013, 05/04/2016, 04/24/2017  . PFIZER SARS-COV-2 Vaccination 09/10/2019, 10/01/2019  . Pneumococcal Conjugate-13 01/21/2014  . Pneumococcal Polysaccharide-23 08/28/2011, 03/06/2012  . Td 10/03/2007, 10/14/2013  . Tdap 10/11/2013  . Zoster 06/21/2010  . Zoster Recombinat (Shingrix) 04/13/2019, 05/18/2019, 07/16/2019    Keep all routine maintenance appointments.   Follow up 04/20/20 @ 2:45  Advanced directives: declined  Conditions/risks identified: none new  Follow up in one year for your  annual wellness visit.   Preventive Care 30 Years and Older, Female Preventive care refers to lifestyle choices and visits with your health care provider that can promote health and wellness. What does preventive care include?  A yearly physical exam. This is also called an annual well check.  Dental exams once or twice a year.  Routine eye exams. Ask your health care provider how often you should have your eyes checked.  Personal lifestyle choices, including:  Daily care of your teeth and gums.  Regular physical activity.  Eating a healthy diet.  Avoiding tobacco and drug use.  Limiting alcohol use.  Practicing safe sex.  Taking low-dose aspirin every day.  Taking vitamin and mineral supplements as recommended by your health care provider. What happens during an annual well check? The services and screenings done by your health care provider during your annual well check will depend on your age, overall health, lifestyle risk factors, and family history of disease. Counseling  Your health care provider may ask you questions about your:  Alcohol use.  Tobacco use.  Drug use.  Emotional well-being.  Home and relationship well-being.  Sexual activity.  Eating habits.  History of falls.  Memory and ability to understand (cognition).  Work and work Statistician.  Reproductive health. Screening  You may have the following tests or measurements:  Height, weight, and BMI.  Blood pressure.  Lipid and cholesterol levels. These may be checked every 5 years, or more frequently if you are over 69 years old.  Skin check.  Lung  cancer screening. You may have this screening every year starting at age 46 if you have a 30-pack-year history of smoking and currently smoke or have quit within the past 15 years.  Fecal occult blood test (FOBT) of the stool. You may have this test every year starting at age 42.  Flexible sigmoidoscopy or colonoscopy. You may have a  sigmoidoscopy every 5 years or a colonoscopy every 10 years starting at age 70.  Hepatitis C blood test.  Hepatitis B blood test.  Sexually transmitted disease (STD) testing.  Diabetes screening. This is done by checking your blood sugar (glucose) after you have not eaten for a while (fasting). You may have this done every 1-3 years.  Bone density scan. This is done to screen for osteoporosis. You may have this done starting at age 58.  Mammogram. This may be done every 1-2 years. Talk to your health care provider about how often you should have regular mammograms. Talk with your health care provider about your test results, treatment options, and if necessary, the need for more tests. Vaccines  Your health care provider may recommend certain vaccines, such as:  Influenza vaccine. This is recommended every year.  Tetanus, diphtheria, and acellular pertussis (Tdap, Td) vaccine. You may need a Td booster every 10 years.  Zoster vaccine. You may need this after age 39.  Pneumococcal 13-valent conjugate (PCV13) vaccine. One dose is recommended after age 74.  Pneumococcal polysaccharide (PPSV23) vaccine. One dose is recommended after age 13. Talk to your health care provider about which screenings and vaccines you need and how often you need them. This information is not intended to replace advice given to you by your health care provider. Make sure you discuss any questions you have with your health care provider. Document Released: 09/09/2015 Document Revised: 05/02/2016 Document Reviewed: 06/14/2015 Elsevier Interactive Patient Education  2017 [employer] Prevention in the Home Falls can cause injuries. They can happen to people of all ages. There are many things you can do to make your home safe and to help prevent falls. What can I do on the outside of my home?  Regularly fix the edges of walkways and driveways and fix any cracks.  Remove anything that might make you  trip as you walk through a door, such as a raised step or threshold.  Trim any bushes or trees on the path to your home.  Use bright outdoor lighting.  Clear any walking paths of anything that might make someone trip, such as rocks or tools.  Regularly check to see if handrails are loose or broken. Make sure that both sides of any steps have handrails.  Any raised decks and porches should have guardrails on the edges.  Have any leaves, snow, or ice cleared regularly.  Use sand or salt on walking paths during winter.  Clean up any spills in your garage right away. This includes oil or grease spills. What can I do in the bathroom?  Use night lights.  Install grab bars by the toilet and in the tub and shower. Do not use towel bars as grab bars.  Use non-skid mats or decals in the tub or shower.  If you need to sit down in the shower, use a plastic, non-slip stool.  Keep the floor dry. Clean up any water that spills on the floor as soon as it happens.  Remove soap buildup in the tub or shower regularly.  Attach bath mats securely with double-sided non-slip rug  tape.  Do not have throw rugs and other things on the floor that can make you trip. What can I do in the bedroom?  Use night lights.  Make sure that you have a light by your bed that is easy to reach.  Do not use any sheets or blankets that are too big for your bed. They should not hang down onto the floor.  Have a firm chair that has side arms. You can use this for support while you get dressed.  Do not have throw rugs and other things on the floor that can make you trip. What can I do in the kitchen?  Clean up any spills right away.  Avoid walking on wet floors.  Keep items that you use a lot in easy-to-reach places.  If you need to reach something above you, use a strong step stool that has a grab bar.  Keep electrical cords out of the way.  Do not use floor polish or wax that makes floors slippery. If  you must use wax, use non-skid floor wax.  Do not have throw rugs and other things on the floor that can make you trip. What can I do with my stairs?  Do not leave any items on the stairs.  Make sure that there are handrails on both sides of the stairs and use them. Fix handrails that are broken or loose. Make sure that handrails are as long as the stairways.  Check any carpeting to make sure that it is firmly attached to the stairs. Fix any carpet that is loose or worn.  Avoid having throw rugs at the top or bottom of the stairs. If you do have throw rugs, attach them to the floor with carpet tape.  Make sure that you have a light switch at the top of the stairs and the bottom of the stairs. If you do not have them, ask someone to add them for you. What else can I do to help prevent falls?  Wear shoes that:  Do not have high heels.  Have rubber bottoms.  Are comfortable and fit you well.  Are closed at the toe. Do not wear sandals.  If you use a stepladder:  Make sure that it is fully opened. Do not climb a closed stepladder.  Make sure that both sides of the stepladder are locked into place.  Ask someone to hold it for you, if possible.  Clearly mark and make sure that you can see:  Any grab bars or handrails.  First and last steps.  Where the edge of each step is.  Use tools that help you move around (mobility aids) if they are needed. These include:  Canes.  Walkers.  Scooters.  Crutches.  Turn on the lights when you go into a dark area. Replace any light bulbs as soon as they burn out.  Set up your furniture so you have a clear path. Avoid moving your furniture around.  If any of your floors are uneven, fix them.  If there are any pets around you, be aware of where they are.  Review your medicines with your doctor. Some medicines can make you feel dizzy. This can increase your chance of falling. Ask your doctor what other things that you can do to  help prevent falls. This information is not intended to replace advice given to you by your health care provider. Make sure you discuss any questions you have with your health care provider. Document Released: 06/09/2009  Document Revised: 01/19/2016 Document Reviewed: 09/17/2014 Elsevier Interactive Patient Education  2017 [employer].

## 2020-04-12 NOTE — Progress Notes (Signed)
Subjective:   Betty [name] is a 74 y.o. female who presents for Medicare Annual (Subsequent) preventive examination.  Review of Systems    No ROS.  Medicare Wellness Virtual Visit.    Cardiac Risk Factors include: advanced age (>16men, >51 women)     Objective:    Today's Vitals   04/12/20 0931  BP: (!) 110/53  Pulse: 89  Weight: 205 lb (93 kg)   Body mass index is 34.11 kg/m.  Advanced Directives 04/12/2020 03/22/2020 04/10/2019 04/08/2018 03/28/2017 09/25/2016 09/20/2016  Does Patient Have a Medical Advance Directive? No No No No No No No  Would patient like information on creating a medical advance directive? No - Patient declined No - Patient declined No - Patient declined No - Patient declined No - Patient declined - -    Current Medications (verified) Outpatient Encounter Medications as of 04/12/2020  Medication Sig  . aspirin 81 MG EC tablet Take 1 tablet (81 mg total) by mouth daily.  Marland Kitchen CALCIUM-MAGNESIUM-VITAMIN D PO Take 1 tablet by mouth daily.  . carvedilol (COREG) 12.5 MG tablet TAKE 1 TABLET BY MOUTH TWICE A DAY WITH MEALS (Patient taking differently: Take 12.5 mg by mouth 2 (two) times daily with a meal. TAKE 1 TABLET BY MOUTH TWICE A DAY WITH MEALS)  . Coenzyme Q10 (CO Q 10 PO) Take 200 mg by mouth at bedtime.   . Fish Oil OIL Take 1,200 mg by mouth 2 (two) times daily. GUMMIES  . losartan (COZAAR) 50 MG tablet Take 0.5 tablets (25 mg total) by mouth daily.  . metFORMIN (GLUCOPHAGE-XR) 500 MG 24 hr tablet TAKE 1 TABLET BY MOUTH EVERY DAY WITH BREAKFAST  . multivitamin (THERAGRAN) per tablet Take 1 tablet by mouth daily.   . polyvinyl alcohol (LIQUIFILM TEARS) 1.4 % ophthalmic solution Place 1 drop into both eyes every morning.  Marland Kitchen SUTAB [phone] MG TABS Take 12 tablets by mouth as directed. Take 12 tablets the night before the colonoscopy as directed and 12 tablets the day of the colonoscopy as directed.  . torsemide (DEMADEX) 10 MG tablet Take 1 tablet (10 mg  total) by mouth daily. (Patient taking differently: Take 10 mg by mouth daily as needed (fluid). )   No facility-administered encounter medications on file as of 04/12/2020.    Allergies (verified) Amoxicillin, Etodolac, Hydrochlorothiazide, Meloxicam, Sulfa antibiotics, Allopurinol, Metformin and related, Rosuvastatin, Sulfonamide derivatives, Jardiance [empagliflozin], and Lasix [furosemide]   History: Past Medical History:  Diagnosis Date  . Allergic rhinitis    never tested, fall and spring  . Arthritis    hands,knees, feet  . Cataracts, bilateral    not surgical yet  . CHOLECYSTECTOMY, HX OF 10/08/2007   Qualifier: Diagnosis of  By: Council Mechanic MD, [name]   . Chronic diastolic CHF (congestive heart failure) ([place])   . Chronic respiratory failure ([place])   . Diabetes mellitus without complication ([place])   . Diverticulosis    problems with frequent gas, burping  . Glucose intolerance (impaired glucose tolerance)   . Gout   . History of chicken pox   . Hyperlipidemia   . Hypertension   . NICM (nonischemic cardiomyopathy) ([place]) 2002   EF 25%; improved to normal - echo 4/08: EF 50-55%, mild MR, mild LAE, mild TR;    cath 3/03: normal cors, EF 40%  . Obesity   . Oxygen deficiency 2017   at night  . PONV (postoperative nausea and vomiting)   . Restrictive lung disease   . Skin  cancer    skin cancers only  . Sleep apnea    cpap settings 4   Past Surgical History:  Procedure Laterality Date  . BILATERAL VATS ABLATION Right    biopsy done 2015- [facility]  . BREAST EXCISIONAL BIOPSY Left 80's   NEG  . CARDIAC CATHETERIZATION    . CHOLECYSTECTOMY    . choleystectomy  02/2002  . COLONOSCOPY WITH PROPOFOL N/A 12/20/2014   Procedure: COLONOSCOPY WITH PROPOFOL;  Surgeon: [name], MD;  Location: [facility] END[facility]COPY;  Service: Gastroenterology;  Laterality: N/A;  . COLONOSCOPY WITH PROPOFOL N/A 03/22/2020   Procedure: COLONOSCOPY WITH PROPOFOL;  Surgeon: [name], MD;  Location: [facility]  ENDOSCOPY;  Service: Gastroenterology;  Laterality: N/A;  . CYSTECTOMY  1996   l breast  . DILATION AND CURETTAGE OF UTERUS  2011   [facility] Hulan [facility]ray at [facility]  . [place]    . nsvd     x 2  . POLYPECTOMY  03/22/2020   Procedure: POLYPECTOMY;  Surgeon: [name], MD;  Location: [facility];  Service: Gastroenterology;;  . [place]  . VAGINAL DELIVERY     x2   Family History  Problem Relation Age of Onset  . Lymphoma Mother 61  . COPD Father        was a smoker  . Stroke Father   . Pneumonia Father   . Diabetes Father   . Hyperlipidemia Father   . Heart disease Father   . Heart attack Father   . Hypertension Father   . Diabetes Sister   . Depression Sister   . Meniere's disease Brother   . Diabetes Paternal Grandfather   . Heart disease Paternal Grandfather   . Schizophrenia Daughter   . Bladder Cancer Maternal Grandmother   . Leukemia Maternal Grandfather   . Hypertension Brother   . Colon cancer Neg Hx   . Stomach cancer Neg Hx   . Breast cancer Neg Hx   . Colon polyps Neg Hx   . Esophageal cancer Neg Hx   . Rectal cancer Neg Hx    Social History   Socioeconomic History  . Marital status: Married    Spouse name: Not on file  . Number of children: 2  . Years of education: Not on file  . Highest education level: Not on file  Occupational History  . Occupation: Retired Product manager: retired  . Occupation: Works at [employer]  . Smoking status: Never Smoker  . Smokeless tobacco: Never Used  Vaping Use  . Vaping Use: Never used  Substance and Sexual Activity  . Alcohol use: No    Alcohol/week: 0.0 standard drinks  . Drug use: No  . Sexual activity: Not Currently  Other Topics Concern  . Not on file  Social History Narrative   Lives in [place] with husband. 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 No pets.   Social Determinants of Health   Financial Resource Strain: Low Risk   . Difficulty  of Paying Living Expenses: Not hard at all  Food Insecurity: No Food Insecurity  . Worried About Charity fundraiser in the Last Year: Never true  . Ran Out of Food in the Last Year: Never true  Transportation Needs: No Transportation Needs  . Lack of Transportation (Medical): No  . Lack of Transportation (Non-Medical): No  Physical Activity:   . Days of Exercise per Week:   . Minutes of  Exercise per Session:   Stress: No Stress Concern Present  . Feeling of Stress : Not at all  Social Connections: Socially Integrated  . Frequency of Communication with Friends and Family: More than three times a week  . Frequency of Social Gatherings with Friends and Family: More than three times a week  . Attends Religious Services: More than 4 times per year  . Active Member of Clubs or Organizations: Yes  . Attends Archivist Meetings: More than 4 times per year  . Marital Status: Married    Tobacco Counseling Counseling given: Not Answered   Clinical Intake:  Pre-visit preparation completed: Yes        Diabetes: No  How often do you need to have someone help you when you read instructions, pamphlets, or other written materials from your doctor or pharmacy?: 1 - Never Interpreter Needed?: No      Activities of Daily Living In your present state of health, do you have any difficulty performing the following activities: 04/12/2020  Hearing? N  Vision? N  Difficulty concentrating or making decisions? N  Walking or climbing stairs? N  Dressing or bathing? N  Doing errands, shopping? N  Preparing Food and eating ? N  Using the Toilet? N  In the past six months, have you accidently leaked urine? Y  Do you have problems with loss of bowel control? N  Managing your Medications? N  Managing your Finances? N  Housekeeping or managing your Housekeeping? Y  Comment Husband assist  Some recent data might be hidden    Patient Care Team: Leone Haven, MD as PCP -  General (Family Medicine) [name], MD as PCP - Sleep Medicine (Sleep Medicine) Dasher, Rayvon Char, MD (Dermatology) Juanito Doom, MD as Referring Physician (Internal Medicine)  Indicate any recent Medical Services you may have received from other than [facility] providers in the past year (date may be approximate).     Assessment:   This is a routine wellness examination for Betty [name].  I connected with Betty [name] today by telephone and verified that I am speaking with the correct person using two identifiers. Location patient: home Location provider: work Persons participating in the virtual visit: patient, Marine scientist.    I discussed the limitations, risks, security and privacy concerns of performing an evaluation and management service by telephone and the availability of in person appointments. The patient expressed understanding and verbally consented to this telephonic visit.    Interactive audio and video telecommunications were attempted between this provider and patient, however failed, due to patient having technical difficulties OR patient did not have access to video capability.  We continued and completed visit with audio only.  Some vital signs may be absent or patient reported.   Hearing/Vision screen  Hearing Screening   125Hz  250Hz  500Hz  1000Hz  2000Hz  3000Hz  4000Hz  6000Hz  8000Hz   Right ear:           Left ear:           Comments: Patient is able to hear conversational tones without difficulty.  No issues reported.  Vision Screening Comments: Followed by [facility] Wears corrective lenses Visual acuity not assessed, virtual visit.  They have seen their ophthalmologist in the last 12 months.    Dietary issues and exercise activities discussed: Current Exercise Habits: Home exercise routine, Intensity: Mild  Healthy diet Fluid restriction- 72 ounces daily Caffeine- occasional diet Dr. Malachi Bonds  Goals    . Healthy Lifestyle  Stay active; chair  exercises Healthy diet Appropriate fluid intake         Depression Screen PHQ 2/9 Scores 04/12/2020 01/15/2020 10/14/2019 04/10/2019 04/08/2018 03/28/2017 11/13/2016  PHQ - 2 Score 0 0 0 0 0 0 0  PHQ- 9 Score - - - - - 0 2    Fall Risk Fall Risk  01/15/2020 10/14/2019 04/10/2019 04/08/2018 03/28/2017  Falls in the past year? 0 0 0 No No  Number falls in past yr: 0 0 - - -  Follow up Falls evaluation completed Falls evaluation completed - - -   Handrails in use when climbing stairs? Yes  Home free of loose throw rugs in walkways, pet beds, electrical cords, etc? Yes  Adequate lighting in your home to reduce risk of falls? Yes   ASSISTIVE DEVICES UTILIZED TO PREVENT FALLS:  Use of a cane, Sleight or w/c? No  Grab bars in the bathroom? Yes  Shower chair or bench in shower? No  Elevated toilet seat or a handicapped toilet? Yes   TIMED UP AND GO:  Was the test performed? No . Virtual visit.    Cognitive Function: MMSE - Mini Mental State Exam 04/08/2018 03/28/2017 03/28/2016  Orientation to time 5 5 5   Orientation to Place 5 5 5   Registration 3 3 3   Attention/ Calculation 5 5 5   Recall 3 3 3   Language- name 2 objects 2 2 2   Language- repeat 1 1 1   Language- follow 3 step command 3 3 3   Language- read & follow direction 1 1 1   Write a sentence 1 1 1   Copy design 1 1 1   Total score 30 30 30      6CIT Screen 04/12/2020 04/10/2019  What Year? 0 points 0 points  What month? 0 points 0 points  What time? - 0 points  Count back from 20 - 0 points  Months in reverse 0 points 0 points  Repeat phrase 0 points 0 points  Total Score - 0    Immunizations Immunization History  Administered Date(s) Administered  . Fluad Quad(high Dose 65+) 04/30/2019  . H1N1 09/08/2008  . Influenza Split 06/28/2011, 05/01/2012  . Influenza Whole 07/11/2007, 06/07/2008, 07/12/2009  . Influenza, High Dose Seasonal PF 05/12/2018  . Influenza,inj,Quad PF,6+ Mos 04/23/2014, 04/21/2015  . Influenza-Unspecified  04/27/2013, 05/04/2016, 04/24/2017  . PFIZER SARS-COV-2 Vaccination 09/10/2019, 10/01/2019  . Pneumococcal Conjugate-13 01/21/2014  . Pneumococcal Polysaccharide-23 08/28/2011, 03/06/2012  . Td 10/03/2007, 10/14/2013  . Tdap 10/11/2013  . Zoster 06/21/2010  . Zoster Recombinat (Shingrix) 04/13/2019, 05/18/2019, 07/16/2019    Health Maintenance Health Maintenance  Topic Date Due  . INFLUENZA VACCINE  07/13/2020 (Originally 03/27/2020)  . HEMOGLOBIN A1C  04/17/2020  . OPHTHALMOLOGY EXAM  05/17/2020  . FOOT EXAM  01/14/2021  . MAMMOGRAM  03/03/2022  . TETANUS/TDAP  10/15/2023  . COLONOSCOPY  03/22/2025  . DEXA SCAN  Completed  . COVID-19 Vaccine  Completed  . Hepatitis C Screening  Completed  . PNA vac Low Risk Adult  Completed   Influenza vaccine- out of stock  Dental Screening: Recommended annual dental exams for proper oral hygiene.   Community Resource Referral / Chronic Care Management: CRR required this visit?  No   CCM required this visit?  No      Plan:   Keep all routine maintenance appointments.   Follow up 04/20/20 @ 2:45  I have personally reviewed and noted the following in the patient's chart:   . Medical and social history .  Use of alcohol, tobacco or illicit drugs  . Current medications and supplements . Functional ability and status . Nutritional status . Physical activity . Advanced directives . List of other physicians . Hospitalizations, surgeries, and ER visits in previous 12 months . Vitals . Screenings to include cognitive, depression, and falls . Referrals and appointments  In addition, I have reviewed and discussed with patient certain preventive protocols, quality metrics, and best practice recommendations. A written personalized care plan for preventive services as well as general preventive health recommendations were provided to patient via mychart.     Varney Biles, LPN   [id number]

## 2020-04-20 ENCOUNTER — Other Ambulatory Visit: Payer: Self-pay

## 2020-04-20 ENCOUNTER — Encounter: Payer: Self-pay | Admitting: Family Medicine

## 2020-04-20 ENCOUNTER — Ambulatory Visit: Payer: Medicare PPO | Admitting: Family Medicine

## 2020-04-20 DIAGNOSIS — E1121 Type 2 diabetes mellitus with diabetic nephropathy: Secondary | ICD-10-CM | POA: Diagnosis not present

## 2020-04-20 DIAGNOSIS — W19XXXA Unspecified fall, initial encounter: Secondary | ICD-10-CM | POA: Insufficient documentation

## 2020-04-20 DIAGNOSIS — I272 Pulmonary hypertension, unspecified: Secondary | ICD-10-CM

## 2020-04-20 LAB — POCT GLYCOSYLATED HEMOGLOBIN (HGB A1C): Hemoglobin A1C: 6.4 % — AB (ref 4.0–5.6)

## 2020-04-20 NOTE — Assessment & Plan Note (Signed)
Check A1c.  Continue Metformin. 

## 2020-04-20 NOTE — Patient Instructions (Addendum)
Nice to see you. Please use your oxygen consistently.  This will help reduce risk of progression of your pulmonary hypertension. Please place antifall stickers on the bottom of your shower or get a shower mat that is antislip.

## 2020-04-20 NOTE — Assessment & Plan Note (Signed)
No injury.  Discussed falls precautions.  Encouraged her to put a rough surface on the bottom of her shower.

## 2020-04-20 NOTE — Progress Notes (Signed)
  Tommi Rumps, MD Phone: [phone]  Betty [name] is a 74 y.o. female who presents today for f/u.  DIABETES Disease Monitoring: Blood Sugar ranges-no checking Polyuria/phagia/dipsia- no      Optho- UTD Medications: Compliance- taking metformin Hypoglycemic symptoms- no  Chronic dyspnea on exertion: Patient notes this is stable.  She has used her oxygen on several occasions when it is particularly humid.  She has not been using the oxygen consistently during the day.  Has not required much torsemide as she has not had much swelling.  Notes her oxygen typically runs 90-91% at home.  Fall: Patient notes she slipped in the shower.  She had no injury.  She admits to being able to grab the grab bar.  They are buying stickers to put on the bottom of the shower.   Social History   Tobacco Use  Smoking Status Never Smoker  Smokeless Tobacco Never Used     ROS see history of present illness  Objective  Physical Exam Vitals:   04/20/20 1442  BP: 116/70  Pulse: 86  Temp: 98 F (36.7 C)  SpO2: 95%    BP Readings from Last 3 Encounters:  04/20/20 116/70  04/12/20 (!) 110/53  03/22/20 (!) 139/92   Wt Readings from Last 3 Encounters:  04/20/20 203 lb (92.1 kg)  04/12/20 205 lb (93 kg)  03/22/20 (!) 205 lb (93 kg)    Physical Exam Constitutional:      General: She is not in acute distress.    Appearance: She is not diaphoretic.  Cardiovascular:     Rate and Rhythm: Normal rate and regular rhythm.     Heart sounds: Normal heart sounds.  Pulmonary:     Effort: Pulmonary effort is normal.     Breath sounds: Normal breath sounds.  Musculoskeletal:     Right lower leg: No edema.     Left lower leg: No edema.  Skin:    General: Skin is warm and dry.  Neurological:     Mental Status: She is alert.      Assessment/Plan: Please see individual problem list.  Pulmonary HTN (HCC) Chronic dyspnea on exertion.  I encouraged her to wear her oxygen consistently.   Discussed that without appropriately wearing her oxygen her pulmonary hypertension may worsen and her breathing difficulty may increase.  Diabetes mellitus type 2, controlled ([place]) Check A1c.  Continue Metformin.  Fall No injury.  Discussed falls precautions.  Encouraged her to put a rough surface on the bottom of her shower.   Orders Placed This Encounter  Procedures  . POCT HgB A1C    No orders of the defined types were placed in this encounter.   This visit occurred during the SARS-CoV-2 public health emergency.  Safety protocols were in place, including screening questions prior to the visit, additional usage of staff PPE, and extensive cleaning of exam room while observing appropriate contact time as indicated for disinfecting solutions.    Tommi Rumps, MD [place]

## 2020-04-20 NOTE — Assessment & Plan Note (Signed)
Chronic dyspnea on exertion.  I encouraged her to wear her oxygen consistently.  Discussed that without appropriately wearing her oxygen her pulmonary hypertension may worsen and her breathing difficulty may increase.

## 2020-04-30 ENCOUNTER — Other Ambulatory Visit: Payer: Self-pay | Admitting: Family Medicine

## 2020-05-04 DIAGNOSIS — J9610 Chronic respiratory failure, unspecified whether with hypoxia or hypercapnia: Secondary | ICD-10-CM | POA: Diagnosis not present

## 2020-05-09 ENCOUNTER — Other Ambulatory Visit: Payer: Self-pay

## 2020-05-09 ENCOUNTER — Encounter: Payer: Self-pay | Admitting: Pulmonary Disease

## 2020-05-09 ENCOUNTER — Ambulatory Visit: Payer: Medicare PPO | Admitting: Pulmonary Disease

## 2020-05-09 VITALS — BP 122/68 | HR 82 | Temp 97.3°F | Ht 65.0 in | Wt 204.8 lb

## 2020-05-09 DIAGNOSIS — J984 Other disorders of lung: Secondary | ICD-10-CM | POA: Diagnosis not present

## 2020-05-09 DIAGNOSIS — J9611 Chronic respiratory failure with hypoxia: Secondary | ICD-10-CM | POA: Diagnosis not present

## 2020-05-09 DIAGNOSIS — R0609 Other forms of dyspnea: Secondary | ICD-10-CM

## 2020-05-09 DIAGNOSIS — I272 Pulmonary hypertension, unspecified: Secondary | ICD-10-CM | POA: Diagnosis not present

## 2020-05-09 DIAGNOSIS — R0600 Dyspnea, unspecified: Secondary | ICD-10-CM | POA: Diagnosis not present

## 2020-05-09 NOTE — Patient Instructions (Signed)
Ncie to meet you!  Continue to do a great job with the fluid pill. I agree with minimizing the amount of water you drink - continue this. Try to watch the sodium or salt intake as this can make the fluid worse.  If inhalers have not been helpful in the past, no need to start them now. However, if you notice breathing worsening, we can try them again to treat whatever inflammatory process has occurred in the lung (noted on the lung biopsy).   Fortunately, your breathing tests are pretty stable over the last many years which is good and reassuring.   Come back in 6 months with Dr. Silas Flood

## 2020-05-10 NOTE — Progress Notes (Signed)
@Patient  ID: Betty [name], female    DOB: 10-May-1946, 74 y.o.   MRN: [id number]  Chief Complaint  Patient presents with   Follow-up    Previously seen by Dr Lake Bells and Dr Carlis Abbott. She had PFT May 2021. She states breathing is overall doing well unless exposed to high heat and humidity. She has not noticed any swelling or need for fluid pill.    Referring provider: Leone Haven, MD  HPI:   74 year old woman with complex pulmonary and cardiac physiology with essentially diastolic dysfunction greatly exacerbated with exercise as demonstrated by exercise right heart cath and PFTs most recently consistent with mixed restrictive and obstructive physiology due to obesity and presumed small airways disease based on CT scan with mosaicism and prior lung biopsy with peribronchiolar fibrosis.  05/10/2020  - Visit   Overall, she thinks she is doing okay.  Breathing is stable.  No worse.  She reports getting generalized dryness of mucous membranes throughout her body with low total body volume when on loop diuretics.  She stopped her Lasix some time ago.  Resume low-dose torsemide and took for a while but is now back to taking as needed.  She monitors her swelling in the sides when to take torsemide.  Stable on 3 L nasal cannula.  Does not think her baseline dyspnea exertion worsened.  She admits that during the pandemic her activity level is less.  Notes increased weight gain which she admits is part of the problem in terms of her dyspnea.  Reviewed her recent PFTs with patient that on my interpretation demonstrate mixed restrictive and obstructive pattern with normal FEV1/FVC ratio, low FEV1 and FVC, total lung capacity borderline low but within normal limits and evidence of gas trapping with markedly elevated RV and RV/TLC ratio, ERV very low indicative of contribution of obesity/chest wall interference.  Questionaires / Pulmonary Flowsheets:   ACT:  No flowsheet data found.  MMRC: No  flowsheet data found.  Epworth:  No flowsheet data found.  Tests:   FENO:  No results found for: NITRICOXIDE  PFT: PFT Results Latest Ref Rng & Units 01/07/2020 11/21/2016  FVC-Pre L 1.50 1.74  FVC-Predicted Pre % 49 55  FVC-Post L 1.33 1.76  FVC-Predicted Post % 43 56  Pre FEV1/FVC % % 86 84  Post FEV1/FCV % % 86 85  FEV1-Pre L 1.28 1.45  FEV1-Predicted Pre % 55 61  FEV1-Post L 1.14 1.49  DLCO uncorrected ml/min/mmHg 19.28 16.09  DLCO UNC% % 96 62  DLCO corrected ml/min/mmHg 19.28 15.89  DLCO COR %Predicted % 96 62  DLVA Predicted % 176 104  TLC L 4.61 3.19  TLC % Predicted % 88 61  RV % Predicted % 137 83    WALK:  SIX MIN WALK 12/06/2017 11/13/2016 09/21/2016 09/29/2013 05/26/2013 12/09/2012 11/11/2012  Supplimental Oxygen during Test? (L/min) No - No No No Yes No  O2 Flow Rate - - - - - 2 -  Type - - - - - Continuous -  2 Minute Oxygen Saturation % - 91 - - - - -  2 Minute HR - 110 - - - - -  4 Minute Oxygen Saturation % - 88 - - - - -  4 Minute HR - 118 - - - - -  6 Minute Oxygen Saturation % - 91 - - - - -  6 Minute HR - 116 - - - - -  Tech Comments: pt walked a moderately fast pace, tolerated walk  well . - pt walked a moderate pace, tolerated walk well.  - - After walking 3 minutes pt walked 450 ft o2 sat was 94%2lpm, pulse 99. After walking another 3 minutes pt walked 500 ft and o2 sat was 96%2lpm and pulse was 100//lmr After walking approx 400 ft o2 sat dropped to 83% ra----increased to 99% on 2lpm continuous//lmr    Imaging: CT chest 2014 personally reviewed and interpreted as diffuse mosaicism, no evidence of fibrosis or interstitial lung disease or other infiltrate.  Lab Results:  CBC    Component Value Date/Time   WBC 8.4 10/19/2019 1214   RBC 4.46 10/19/2019 1214   HGB 14.5 10/19/2019 1214   HCT 43.3 10/19/2019 1214   PLT 229 10/19/2019 1214   MCV 97.1 10/19/2019 1214   MCH 32.5 10/19/2019 1214   MCHC 33.5 10/19/2019 1214   RDW 12.8 10/19/2019 1214    LYMPHSABS 2.3 01/27/2016 1024   MONOABS 0.7 01/27/2016 1024   EOSABS 0.1 01/27/2016 1024   BASOSABS 0.0 01/27/2016 1024    BMET    Component Value Date/Time   NA 138 12/14/2019 0933   NA 139 03/09/2019 1058   K 3.9 12/14/2019 0933   CL 100 12/14/2019 0933   CO2 33 (H) 12/14/2019 0933   GLUCOSE 154 (H) 12/14/2019 0933   BUN 15 12/14/2019 0933   BUN 16 03/09/2019 1058   CREATININE 0.54 12/14/2019 0933   CREATININE 0.64 01/08/2014 1657   CALCIUM 9.4 12/14/2019 0933   GFRNONAA >60 10/19/2019 1214   GFRNONAA >89 01/08/2014 1657   GFRAA >60 10/19/2019 1214   GFRAA >89 01/08/2014 1657    BNP    Component Value Date/Time   BNP 28.0 10/19/2019 1214    ProBNP    Component Value Date/Time   PROBNP 35 12/22/2019 1121   PROBNP 8.0 07/30/2016 1027    Specialty Problems      Pulmonary Problems   ALLERGIC RHINITIS, SEASONAL    Qualifier: Diagnosis of  By: Council Mechanic MD, [name]       DOE (dyspnea on exertion)    06/2012 Echo>> grade 1 diastolic dysfunction, LVEF 55%, RV/RA normal, triscupid regurge jet velocity WNL 08/2012 PFT [facility] >> Ratio normal, Flow volume loop normal; FVC 1.46 L (48% pred), TLC 2.77 L (55% pred) ERV 0.03 (3% pred), DLCO 8.4 (35% pred) 08/2012 ABG >> 7.39/52/57/31.5/92% 10/01/2012 PSG>> poor sleep efficiency; AHI < 5; desaturated as low as 82% 09/2012 PFT [facility] >> unchanged from 08/2012 study. 10/20/2012 MIP/MEP >> 36 (36% pred)/-20 (28% pred) 09/2012 serology>> ANA neg, Anti-SCL70 neg, SSA/SSB neg/neg, Anti-Jo-1 neg, Aldolase 5, CRP 5, ESR 30, RF 31 10/24/2012 Sniff test >> normal diaphragm movement 09/2012 Home PSG >> AHI 12, several desaturation events independent of apnea 09/2012 Hypersensitivity Pneumonitis profile> Asp flav pos/asp nig partial; all else negative 12/09/2012 6MW >> 950 feet on 2L [place] 12/16/2012 V/Q scan >> multiple areas of matched decreased ventilation and perfusion uptake, more in LUL 01/21/2013 RHC >> RA 9 mmHg, RV 32/8 mmHg, PA 30/16/23  mmHg, PCWP 12 mmHg, Normal wave forms. Oxygen saturations: (done with 2 L  [place]), PA 73 %, AO 98%, RV 74%, RA 75%, SVC 75%, IVC 83%; Cardiac Output (Fick) 5.3, Cardiac Index (Fick) 2.7, PVR: 2.1  07/2013 Open lung biopsy [facility]> mild peribronchiolar fibrosis without inflammatory cells, also mild scattered areas of pulmonary arteriopathic change; normal airspace/alveolar tissue 10/2013 Exercise RHC at [facility] rest> PA 36/20 (mean 26), PCW 15, RA 9, BP 158/77, PA sat 65.5%, CI  2.88, CO 5.7, PVR 1.75 WU; Exercise> PA 78/40 (56), PCW 28, BP 228/83, CI 6.9, CO 13.75, PVR 2 WU; O2 saturation noted to drop to 77% on RA      Chronic hypoxemic respiratory failure (HCC)    11/11/2012 > desaturated to 83% on RA walking <200 feet in office; started on 2L [place] with good response 10/2012  ONO on CPAP> time <88% 6 hours, 59 minutes 09/2016 ONO CPAP 3L time less than 88% 58 minutes       Obstructive sleep apnea    10/01/2012 PSG > poor sleep efficiency Early March, 2014 home sleep study> AHI 12, multiple desaturation events 11/2012 download > >80% compliance, 95th percentile pressure 12.7 cm H20      Restrictive lung disease      Allergies  Allergen Reactions   Amoxicillin Rash    REACTION: RASH   Etodolac Other (See Comments)    Bloody stool    Hydrochlorothiazide Other (See Comments)    Increases gout flare Increases gout flare   Meloxicam Other (See Comments)    constipation Constipation   Sulfa Antibiotics Other (See Comments)    rash   Allopurinol    Metformin And Related     diarrhea   Rosuvastatin Other (See Comments)    "extreme joint pain"   Sulfonamide Derivatives     REACTION: RASH   Jardiance [Empagliflozin] Other (See Comments)    Yeast infection   Lasix [Furosemide] Other (See Comments)    Pt states "it dried everything out of me"    Immunization History  Administered Date(s) Administered   Fluad Quad(high Dose 65+) 04/30/2019   H1N1 09/08/2008   Influenza Split  06/28/2011, 05/01/2012   Influenza Whole 07/11/2007, 06/07/2008, 07/12/2009   Influenza, High Dose Seasonal PF 05/12/2018   Influenza,inj,Quad PF,6+ Mos 04/23/2014, 04/21/2015   Influenza-Unspecified 04/27/2013, 05/04/2016, 04/24/2017   PFIZER SARS-COV-2 Vaccination 09/10/2019, 10/01/2019   Pneumococcal Conjugate-13 01/21/2014   Pneumococcal Polysaccharide-23 08/28/2011, 03/06/2012   Td 10/03/2007, 10/14/2013   Tdap 10/11/2013   Zoster 06/21/2010   Zoster Recombinat (Shingrix) 04/13/2019, 05/18/2019, 07/16/2019    Past Medical History:  Diagnosis Date   Allergic rhinitis    never tested, fall and spring   Arthritis    hands,knees, feet   Cataracts, bilateral    not surgical yet   CHOLECYSTECTOMY, HX OF 10/08/2007   Qualifier: Diagnosis of  By: Council Mechanic MD, [name]    Chronic diastolic CHF (congestive heart failure) (HCC)    Chronic respiratory failure ([place])    Diabetes mellitus without complication ([place])    Diverticulosis    problems with frequent gas, burping   Glucose intolerance (impaired glucose tolerance)    Gout    History of chicken pox    Hyperlipidemia    Hypertension    NICM (nonischemic cardiomyopathy) ([place]) 2002   EF 25%; improved to normal - echo 4/08: EF 50-55%, mild MR, mild LAE, mild TR;    cath 3/03: normal cors, EF 40%   Obesity    Oxygen deficiency 2017   at night   PONV (postoperative nausea and vomiting)    Restrictive lung disease    Skin cancer    skin cancers only   Sleep apnea    cpap settings 4    Tobacco History: Social History   Tobacco Use  Smoking Status Never Smoker  Smokeless Tobacco Never Used   Counseling given: Not Answered    Outpatient Encounter Medications as of 05/09/2020  Medication Sig  aspirin 81 MG EC tablet Take 1 tablet (81 mg total) by mouth daily.   CALCIUM-MAGNESIUM-VITAMIN D PO Take 1 tablet by mouth daily.   carvedilol (COREG) 12.5 MG tablet TAKE 1 TABLET BY MOUTH TWICE  A DAY WITH MEALS (Patient taking differently: Take 12.5 mg by mouth 2 (two) times daily with a meal. TAKE 1 TABLET BY MOUTH TWICE A DAY WITH MEALS)   Coenzyme Q10 (CO Q 10 PO) Take 200 mg by mouth at bedtime.    Fish Oil OIL Take 1,200 mg by mouth 2 (two) times daily. GUMMIES   losartan (COZAAR) 50 MG tablet Take 0.5 tablets (25 mg total) by mouth daily.   metFORMIN (GLUCOPHAGE-XR) 500 MG 24 hr tablet TAKE 1 TABLET BY MOUTH EVERY DAY WITH BREAKFAST   multivitamin (THERAGRAN) per tablet Take 1 tablet by mouth daily.    polyvinyl alcohol (LIQUIFILM TEARS) 1.4 % ophthalmic solution Place 1 drop into both eyes every morning.   SUTAB [phone] MG TABS Take 12 tablets by mouth as directed. Take 12 tablets the night before the colonoscopy as directed and 12 tablets the day of the colonoscopy as directed.   torsemide (DEMADEX) 10 MG tablet Take 1 tablet (10 mg total) by mouth daily. (Patient taking differently: Take 10 mg by mouth daily as needed (fluid). )   No facility-administered encounter medications on file as of 05/09/2020.     Review of Systems  Review of Systems  No orthopnea per her report.  No PND.  Lower extremity swelling is minimal.  Comments review of systems otherwise negative.  Physical Exam  BP 122/68 (BP Location: Left Arm, Cuff Size: Normal)    Pulse 82    Temp (!) 97.3 F (36.3 C) (Temporal)    Ht 5' 5"  (1.651 m)    Wt 204 lb 12.8 oz (92.9 kg)    SpO2 95% Comment: 2lpm pulsed o2   BMI 34.08 kg/m   Wt Readings from Last 5 Encounters:  05/09/20 204 lb 12.8 oz (92.9 kg)  04/20/20 203 lb (92.1 kg)  04/12/20 205 lb (93 kg)  03/22/20 (!) 205 lb (93 kg)  02/22/20 203 lb 12.8 oz (92.4 kg)    BMI Readings from Last 5 Encounters:  05/09/20 34.08 kg/m  04/20/20 33.78 kg/m  04/12/20 34.11 kg/m  03/22/20 34.11 kg/m  02/22/20 33.91 kg/m     Physical Exam General: Chronically ill, sitting up in exam chair Eyes: EOMI, no icterus Neck: No JVP appreciated  sitting upright, supple Respiratory: Clear to auscultation bilaterally, no crackles, normal work of breathing on 3 L Cardiovascular: Regular rate and rhythm, no murmurs Abdomen: Nondistended, bowel sounds present MSK: No synovitis, no joint effusions Neuro: Slow gait, no weakness Psych: Normal mood, full affect    Assessment & Plan:   74 year old woman with complex pulmonary and cardiac physiology with essentially diastolic dysfunction greatly exacerbated with exercise as demonstrated by exercise right heart cath and PFTs most recently consistent with mixed restrictive and obstructive physiology due to obesity and presumed small airways disease based on CT scan with mosaicism and prior lung biopsy with peribronchiolar fibrosis.  Dyspnea on exertion: Stable per her report.  Combination of small airways disease, obesity/chest wall interference (very low ERV) and profound worsening of diastolic dysfunction with exercise.  Favor the latter to be the primary driver of her hypoxemia although still both certainly contributing.  Fortunately, she reports her fluid is at a good place and is not using loop diuretics regularly.  She is trialed  inhalers in the past for bronchodilation without improvement.  Discussed that given her most recent PFTs with gas trapping they may be beneficial.  It is possible that her small airways disease is fully driven by peribronchial fibrosis and therefore may not respond to inhaler therapy.  Given she is stable and she has not benefited from these in the past, will not resume at this point.  Remains an option in the future if breathing should worsen.  Chronic hypoxemic respiratory failure: Multifactorial due to above.  No evidence of pulmonary fibrosis or interstitial lung disease on prior CTs.  Lung biopsy not consistent with pulmonary fibrosis or other interstitial lung disease. --Advised her on low-salt diet, minimizing fluid intake  Small airways disease: As demonstrated  with peribronchial fibrosis on prior lung biopsy at [facility].  Unclear cause for this.  Review of literature indicates most causes are due to hypersensitive pneumonitis or reflux.  She has no evidence of hypersensitive pneumonitis on prior scans.  She has occasional but minimal GERD symptoms.  She is a never smoker.  Possible inflammatory response to asthma.  Consider bronchodilators in the future.  Pulmonary hypertension: Group 2 disease with mean PA pressure 26, elevated wedge pressure with markedly increased wedge pressure with exercise during procedure with PVR less than 2.  To continue management of fluid and edema with as needed loop diuretics, low-salt diet, minimizing fluid intake.   Return in about 6 months (around 11/06/2020).   Lanier Clam, MD 05/10/2020   This appointment required 68 minutes of patient care (this includes precharting, chart review, review of results, face-to-face care, etc.).

## 2020-05-11 DIAGNOSIS — D2262 Melanocytic nevi of left upper limb, including shoulder: Secondary | ICD-10-CM | POA: Diagnosis not present

## 2020-05-11 DIAGNOSIS — D2272 Melanocytic nevi of left lower limb, including hip: Secondary | ICD-10-CM | POA: Diagnosis not present

## 2020-05-11 DIAGNOSIS — L820 Inflamed seborrheic keratosis: Secondary | ICD-10-CM | POA: Diagnosis not present

## 2020-05-11 DIAGNOSIS — L570 Actinic keratosis: Secondary | ICD-10-CM | POA: Diagnosis not present

## 2020-05-11 DIAGNOSIS — Z85828 Personal history of other malignant neoplasm of skin: Secondary | ICD-10-CM | POA: Diagnosis not present

## 2020-05-11 DIAGNOSIS — X32XXXA Exposure to sunlight, initial encounter: Secondary | ICD-10-CM | POA: Diagnosis not present

## 2020-05-11 DIAGNOSIS — L538 Other specified erythematous conditions: Secondary | ICD-10-CM | POA: Diagnosis not present

## 2020-05-11 DIAGNOSIS — D2261 Melanocytic nevi of right upper limb, including shoulder: Secondary | ICD-10-CM | POA: Diagnosis not present

## 2020-05-11 DIAGNOSIS — D225 Melanocytic nevi of trunk: Secondary | ICD-10-CM | POA: Diagnosis not present

## 2020-05-19 DIAGNOSIS — H2513 Age-related nuclear cataract, bilateral: Secondary | ICD-10-CM | POA: Diagnosis not present

## 2020-05-19 LAB — HM DIABETES EYE EXAM

## 2020-05-22 ENCOUNTER — Other Ambulatory Visit: Payer: Self-pay | Admitting: Family Medicine

## 2020-05-26 ENCOUNTER — Encounter: Payer: Self-pay | Admitting: Family Medicine

## 2020-06-03 DIAGNOSIS — J9610 Chronic respiratory failure, unspecified whether with hypoxia or hypercapnia: Secondary | ICD-10-CM | POA: Diagnosis not present

## 2020-06-08 DIAGNOSIS — E119 Type 2 diabetes mellitus without complications: Secondary | ICD-10-CM | POA: Diagnosis not present

## 2020-06-08 DIAGNOSIS — H2511 Age-related nuclear cataract, right eye: Secondary | ICD-10-CM | POA: Diagnosis not present

## 2020-06-14 ENCOUNTER — Encounter: Payer: Self-pay | Admitting: Ophthalmology

## 2020-06-14 ENCOUNTER — Other Ambulatory Visit: Payer: Self-pay

## 2020-06-17 ENCOUNTER — Other Ambulatory Visit
Admission: RE | Admit: 2020-06-17 | Discharge: 2020-06-17 | Disposition: A | Discharge status: Home / Self Care | Payer: Medicare PPO | Source: Ambulatory Visit | Attending: Ophthalmology | Admitting: Ophthalmology

## 2020-06-17 ENCOUNTER — Other Ambulatory Visit: Payer: Self-pay

## 2020-06-17 DIAGNOSIS — Z20822 Contact with and (suspected) exposure to covid-19: Secondary | ICD-10-CM | POA: Diagnosis not present

## 2020-06-17 DIAGNOSIS — Z01812 Encounter for preprocedural laboratory examination: Secondary | ICD-10-CM | POA: Diagnosis not present

## 2020-06-17 NOTE — Anesthesia Preprocedure Evaluation (Addendum)
Anesthesia Evaluation  Patient identified by MRN, date of birth, ID band Patient awake    Reviewed: Allergy & Precautions, NPO status , Patient's Chart, lab work & pertinent test results, reviewed documented beta blocker date and time   History of Anesthesia Complications (+) PONV and history of anesthetic complications  Airway Mallampati: III  TM Distance: <3 FB Neck ROM: Full    Dental   Pulmonary sleep apnea and Oxygen sleep apnea ,   Chronic hypoxemic respiratory failure Pulm HTN (mean PA pressure 26) Mixed obstructive & restrictive lung disease with markedly depressed DLCO at 35% predicted   breath sounds clear to auscultation       Cardiovascular hypertension, (-) angina+CHF (NICM) and + DOE (Chronic, stable)   Rhythm:Regular Rate:Normal   HLD  TTE (2019): LV mildly dilated, LVEF 88-75, grade 1 diastolic dysfunction RV function normal   Neuro/Psych  Neuromuscular disease (Cervical radiculopathy, sciatica)    GI/Hepatic neg GERD  , Diverticulosis   Endo/Other  diabetes  Renal/GU      Musculoskeletal  (+) Arthritis ,   Abdominal (+) + obese (BMI 34),   Peds  Hematology   Anesthesia Other Findings Gout Skin cancer   Reproductive/Obstetrics                           Anesthesia Physical Anesthesia Plan  ASA: III  Anesthesia Plan: MAC   Post-op Pain Management:    Induction: Intravenous  PONV Risk Score and Plan: 3 and TIVA, Midazolam and Treatment may vary due to age or medical condition  Airway Management Planned: Nasal Cannula  Additional Equipment:   Intra-op Plan:   Post-operative Plan:   Informed Consent: I have reviewed the patients History and Physical, chart, labs and discussed the procedure including the risks, benefits and alternatives for the proposed anesthesia with the patient or authorized representative who has indicated his/her understanding and  acceptance.       Plan Discussed with: CRNA and Anesthesiologist  Anesthesia Plan Comments:        Anesthesia Quick Evaluation

## 2020-06-17 NOTE — Discharge Instructions (Signed)

## 2020-06-18 LAB — SARS CORONAVIRUS 2 (TAT 6-24 HRS): SARS Coronavirus 2: NEGATIVE

## 2020-06-21 ENCOUNTER — Ambulatory Visit: Payer: Medicare PPO | Admitting: Anesthesiology

## 2020-06-21 ENCOUNTER — Other Ambulatory Visit: Payer: Self-pay

## 2020-06-21 ENCOUNTER — Encounter: Payer: Self-pay | Admitting: Ophthalmology

## 2020-06-21 ENCOUNTER — Encounter
Admission: RE | Disposition: A | Discharge status: Home / Self Care | Payer: Self-pay | Source: Home / Self Care | Attending: Ophthalmology

## 2020-06-21 ENCOUNTER — Ambulatory Visit
Admission: RE | Admit: 2020-06-21 | Discharge: 2020-06-21 | Disposition: A | Discharge status: Home / Self Care | Payer: Medicare PPO | Attending: Ophthalmology | Admitting: Ophthalmology

## 2020-06-21 DIAGNOSIS — Z882 Allergy status to sulfonamides status: Secondary | ICD-10-CM | POA: Diagnosis not present

## 2020-06-21 DIAGNOSIS — H25811 Combined forms of age-related cataract, right eye: Secondary | ICD-10-CM | POA: Diagnosis not present

## 2020-06-21 DIAGNOSIS — Z833 Family history of diabetes mellitus: Secondary | ICD-10-CM | POA: Insufficient documentation

## 2020-06-21 DIAGNOSIS — H2511 Age-related nuclear cataract, right eye: Secondary | ICD-10-CM | POA: Diagnosis not present

## 2020-06-21 DIAGNOSIS — Z880 Allergy status to penicillin: Secondary | ICD-10-CM | POA: Insufficient documentation

## 2020-06-21 DIAGNOSIS — E1136 Type 2 diabetes mellitus with diabetic cataract: Secondary | ICD-10-CM | POA: Diagnosis not present

## 2020-06-21 DIAGNOSIS — Z888 Allergy status to other drugs, medicaments and biological substances status: Secondary | ICD-10-CM | POA: Diagnosis not present

## 2020-06-21 HISTORY — PX: CATARACT EXTRACTION W/PHACO: SHX586

## 2020-06-21 LAB — GLUCOSE, CAPILLARY
Glucose-Capillary: 115 mg/dL — ABNORMAL HIGH (ref 70–99)
Glucose-Capillary: 117 mg/dL — ABNORMAL HIGH (ref 70–99)

## 2020-06-21 SURGERY — PHACOEMULSIFICATION, CATARACT, WITH IOL INSERTION
Anesthesia: Monitor Anesthesia Care | Site: Eye | Laterality: Right

## 2020-06-21 MED ORDER — MIDAZOLAM HCL 2 MG/2ML IJ SOLN
INTRAMUSCULAR | Status: DC | PRN
  Administered 2020-06-21: 2 mg via INTRAVENOUS

## 2020-06-21 MED ORDER — LACTATED RINGERS IV SOLN: INTRAVENOUS | Status: DC

## 2020-06-21 MED ORDER — BRIMONIDINE TARTRATE-TIMOLOL 0.2-0.5 % OP SOLN
OPHTHALMIC | Status: DC | PRN
  Administered 2020-06-21: 1 [drp] via OPHTHALMIC

## 2020-06-21 MED ORDER — MOXIFLOXACIN HCL 0.5 % OP SOLN
OPHTHALMIC | Status: DC | PRN
  Administered 2020-06-21: 0.2 mL via OPHTHALMIC

## 2020-06-21 MED ORDER — LIDOCAINE HCL (PF) 2 % IJ SOLN
INTRAOCULAR | Status: DC | PRN
  Administered 2020-06-21: 1 mL

## 2020-06-21 MED ORDER — EPINEPHRINE PF 1 MG/ML IJ SOLN
INTRAOCULAR | Status: DC | PRN
  Administered 2020-06-21: 62 mL via OPHTHALMIC

## 2020-06-21 MED ORDER — FENTANYL CITRATE (PF) 100 MCG/2ML IJ SOLN
INTRAMUSCULAR | Status: DC | PRN
  Administered 2020-06-21: 50 ug via INTRAVENOUS

## 2020-06-21 MED ORDER — ARMC OPHTHALMIC DILATING DROPS
1.0000 "application " | OPHTHALMIC | Status: DC | PRN
  Administered 2020-06-21 (×3): 1 via OPHTHALMIC

## 2020-06-21 MED ORDER — TETRACAINE HCL 0.5 % OP SOLN
1.0000 [drp] | OPHTHALMIC | Status: DC | PRN
  Administered 2020-06-21 (×3): 1 [drp] via OPHTHALMIC

## 2020-06-21 MED ORDER — NA CHONDROIT SULF-NA HYALURON 40-17 MG/ML IO SOLN
INTRAOCULAR | Status: DC | PRN
  Administered 2020-06-21: 1 mL via INTRAOCULAR

## 2020-06-21 SURGICAL SUPPLY — 23 items
CANNULA ANT/CHMB 27G (MISCELLANEOUS) ×2 IMPLANT
CANNULA ANT/CHMB 27GA (MISCELLANEOUS) ×6 IMPLANT
GLOVE SURG LX 8.0 MICRO (GLOVE) ×4
GLOVE SURG LX STRL 8.0 MICRO (GLOVE) ×1 IMPLANT
GLOVE SURG TRIUMPH 8.0 PF LTX (GLOVE) ×3 IMPLANT
GOWN STRL REUS W/ TWL LRG LVL3 (GOWN DISPOSABLE) ×2 IMPLANT
GOWN STRL REUS W/TWL LRG LVL3 (GOWN DISPOSABLE) ×6
LENS IOL ACRSF VT TRC 415 21.0 IMPLANT
LENS IOL ACRYSOF VIVITY 21.0 ×3 IMPLANT
LENS IOL VIVITY 415 21.0 ×1 IMPLANT
MARKER SKIN DUAL TIP RULER LAB (MISCELLANEOUS) ×3 IMPLANT
NDL FILTER BLUNT 18X1 1/2 (NEEDLE) ×1 IMPLANT
NEEDLE FILTER BLUNT 18X 1/2SAF (NEEDLE) ×2
NEEDLE FILTER BLUNT 18X1 1/2 (NEEDLE) ×1 IMPLANT
PACK EYE AFTER SURG (MISCELLANEOUS) ×3 IMPLANT
PACK OPTHALMIC (MISCELLANEOUS) ×3 IMPLANT
PACK PORFILIO (MISCELLANEOUS) ×3 IMPLANT
SUT ETHILON 10-0 CS-B-6CS-B-6 (SUTURE)
SUTURE EHLN 10-0 CS-B-6CS-B-6 (SUTURE) IMPLANT
SYR 3ML LL SCALE MARK (SYRINGE) ×3 IMPLANT
SYR TB 1ML LUER SLIP (SYRINGE) ×3 IMPLANT
WATER STERILE IRR 250ML POUR (IV SOLUTION) ×3 IMPLANT
WIPE NON LINTING 3.25X3.25 (MISCELLANEOUS) ×3 IMPLANT

## 2020-06-21 NOTE — Op Note (Signed)
PREOPERATIVE DIAGNOSIS:  Nuclear sclerotic cataract of the right eye.   POSTOPERATIVE DIAGNOSIS:  Nuclear sclerotic cataract of the right eye.   OPERATIVE PROCEDURE: Procedure(s): CATARACT EXTRACTION PHACO AND INTRAOCULAR LENS PLACEMENT (IOC) RIGHT DIABETIC   SURGEON:  Birder Robson, MD.   ANESTHESIA: 1.      Managed anesthesia care. 2.     0.31ml of Shugarcaine was instilled following the paracentesis  Anesthesiologist: Heniser, Fredric Dine, MD CRNA: Jeannene Patella, CRNA  COMPLICATIONS:  None.   TECHNIQUE:   Stop and chop    DESCRIPTION OF PROCEDURE:  The patient was examined and consented in the preoperative holding area where the aforementioned topical anesthesia was applied to the right eye.  The patient was brought back to the Operating Room where he was sat upright on the gurney and given a target to fixate upon while the eye was marked at the 3:00 and 9:00 position.  The patient was then reclined on the operating table.  The eye was prepped and draped in the usual sterile ophthalmic fashion and a lid speculum was placed. A paracentesis was created with the side port blade and the anterior chamber was filled with viscoelastic. A near clear corneal incision was performed with the steel keratome. A continuous curvilinear capsulorrhexis was performed with a cystotome followed by the capsulorrhexis forceps. Hydrodissection and hydrodelineation were carried out with BSS on a blunt cannula. The lens was removed in a stop and chop technique and the remaining cortical material was removed with the irrigation-aspiration handpiece. The eye was inflated with viscoelastic and the Vivity  lens  was placed in the eye and rotated to within a few degrees of the predetermined orientation.  The remaining viscoelastic was removed from the eye.  The Sinskey hook was used to rotate the toric lens into its final resting place at 003 degrees.  . The eye was inflated to a physiologic pressure and found to be  watertight. 0.82ml of Vigamox was placed in the anterior chamber.  The eye was dressed with Vigamox. The patient was given protective glasses to wear throughout the day and a shield with which to sleep tonight. The patient was also given drops with which to begin a drop regimen today and will follow-up with me in one day. Implant Name Type Inv. Item Serial No. Manufacturer Lot No. LRB No. Used Action  ALCON ACRYSOF IQ VIVITY TORIC IOL Intraocular Lens  [id number] ALCON  Right 1 Implanted   Procedure(s) with comments: CATARACT EXTRACTION PHACO AND INTRAOCULAR LENS PLACEMENT (IOC) RIGHT DIABETIC (Right) - 4.06 0:32.8  Electronically signed: Birder Robson 06/21/2020 9:50 AM

## 2020-06-21 NOTE — Anesthesia Postprocedure Evaluation (Signed)
Anesthesia Post Note  Patient: Betty [name]  Procedure(s) Performed: CATARACT EXTRACTION PHACO AND INTRAOCULAR LENS PLACEMENT (IOC) RIGHT DIABETIC (Right Eye)     Patient location during evaluation: PACU Anesthesia Type: MAC Level of consciousness: awake and alert Pain management: pain level controlled Vital Signs Assessment: post-procedure vital signs reviewed and stable Respiratory status: spontaneous breathing, nonlabored ventilation, respiratory function stable and patient connected to nasal cannula oxygen Cardiovascular status: stable and blood pressure returned to baseline Postop Assessment: no apparent nausea or vomiting Anesthetic complications: no   No complications documented.  Dimitris Shanahan A  Trygve Thal

## 2020-06-21 NOTE — H&P (Signed)
[facility]   Primary Care Physician:  Leone Haven, MD Ophthalmologist: Dr. George Ina  Pre-Procedure History & Physical: HPI:  Betty [name] is a 74 y.o. female here for cataract surgery.   Past Medical History:  Diagnosis Date   Allergic rhinitis    never tested, fall and spring   Arthritis    hands,knees, feet   Cataracts, bilateral    not surgical yet   CHOLECYSTECTOMY, HX OF 10/08/2007   Qualifier: Diagnosis of  By: Council Mechanic MD, [name]    Chronic diastolic CHF (congestive heart failure) (HCC)    Chronic respiratory failure ([place])    Diabetes mellitus without complication ([place])    Diverticulosis    problems with frequent gas, burping   Glucose intolerance (impaired glucose tolerance)    Gout    History of chicken pox    Hyperlipidemia    Hypertension    NICM (nonischemic cardiomyopathy) ([place]) 2002   EF 25%; improved to normal - echo 4/08: EF 50-55%, mild MR, mild LAE, mild TR;    cath 3/03: normal cors, EF 40%   Obesity    Oxygen deficiency 2017   at night   PONV (postoperative nausea and vomiting)    after wisdom tooth extraction   Restrictive lung disease    Skin cancer    skin cancers only   Sleep apnea    cpap settings 4    Past Surgical History:  Procedure Laterality Date   BILATERAL VATS ABLATION Right    biopsy done 2015- [facility]   BREAST EXCISIONAL BIOPSY Left 80's   NEG   CARDIAC CATHETERIZATION     CHOLECYSTECTOMY     choleystectomy  02/2002   COLONOSCOPY WITH PROPOFOL N/A 12/20/2014   Procedure: COLONOSCOPY WITH PROPOFOL;  Surgeon: [name], MD;  [facility]: [facility];  Service: Gastroenterology;  Laterality: N/A;   COLONOSCOPY WITH PROPOFOL N/A 03/22/2020   Procedure: COLONOSCOPY WITH PROPOFOL;  Surgeon: [name], MD;  [facility];  Service: Gastroenterology;  Laterality: N/A;   CYSTECTOMY  1996   l breast   DILATION AND CURETTAGE OF UTERUS  2011   Dr. Hulan Fray at [facility]     x 2   POLYPECTOMY  03/22/2020   Procedure: POLYPECTOMY;  Surgeon: [name], MD;  Location: [facility];  Service: Gastroenterology;;   [place]     x2    Prior to Admission medications   Medication Sig Start Date End Date Taking? Authorizing Provider  aspirin 81 MG EC tablet Take 1 tablet (81 mg total) by mouth daily. 09/19/17  Yes Shirley Friar, PA-C  CALCIUM-MAGNESIUM-VITAMIN D PO Take 1 tablet by mouth daily.   Yes [provider]  carvedilol (COREG) 12.5 MG tablet TAKE 1 TABLET BY MOUTH TWICE A DAY WITH MEALS Patient taking differently: Take 12.5 mg by mouth 2 (two) times daily with a meal. TAKE 1 TABLET BY MOUTH TWICE A DAY WITH MEALS 01/01/20  Yes Turner, Traci R, MD  Coenzyme Q10 (CO Q 10 PO) Take 200 mg by mouth at bedtime.    Yes [provider]  Fish Oil OIL Take 1,200 mg by mouth 2 (two) times daily. GUMMIES   Yes [provider]  losartan (COZAAR) 50 MG tablet Take 0.5 tablets (25 mg total) by mouth daily. 10/14/19  Yes Leone Haven, MD  metFORMIN (GLUCOPHAGE-XR) 500 MG 24 hr  tablet TAKE 1 TABLET BY MOUTH EVERY DAY WITH BREAKFAST 05/24/20  Yes Leone Haven, MD  multivitamin [facility]) per tablet Take 1 tablet by mouth daily.    Yes [provider]  polyvinyl alcohol (LIQUIFILM TEARS) 1.4 % ophthalmic solution Place 1 drop into both eyes every morning.   Yes [provider]  torsemide (DEMADEX) 10 MG tablet Take 1 tablet (10 mg total) by mouth daily. Patient not taking: Reported on 06/14/2020 12/22/19   [name], MD    Allergies as of 05/20/2020 - Review Complete 05/09/2020  Allergen Reaction Noted   Amoxicillin Rash 10/08/2007   Etodolac Other (See Comments) 10/21/2013   Hydrochlorothiazide Other (See Comments) 10/21/2013   Meloxicam Other (See Comments) 10/21/2013   Sulfa antibiotics Other (See Comments) 02/19/2013   Allopurinol   03/17/2018   Metformin and related  04/21/2015   Rosuvastatin Other (See Comments) 02/22/2020   Sulfonamide derivatives  10/08/2007   Jardiance [empagliflozin] Other (See Comments) 02/22/2020   Lasix [furosemide] Other (See Comments) 02/22/2020    Family History  Problem Relation Age of Onset   Lymphoma Mother 96   COPD Father        was a smoker   Stroke Father    Pneumonia Father    Diabetes Father    Hyperlipidemia Father    Heart disease Father    Heart attack Father    Hypertension Father    Diabetes Sister    Depression Sister    Meniere's disease Brother    Diabetes Paternal Grandfather    Heart disease Paternal Grandfather    Schizophrenia Daughter    Bladder Cancer Maternal Grandmother    Leukemia Maternal Grandfather    Hypertension Brother    Colon cancer Neg Hx    Stomach cancer Neg Hx    Breast cancer Neg Hx    Colon polyps Neg Hx    Esophageal cancer Neg Hx    Rectal cancer Neg Hx     Social History   Socioeconomic History   Marital status: Married    Spouse name: Not on file   Number of children: 2   Years of education: Not on file   Highest education level: Not on file  Occupational History   Occupation: Retired Product manager: retired   Occupation: Works at [employer]  Tobacco Use   Smoking status: Never Smoker   Smokeless tobacco: Never Used  Scientific laboratory technician Use: Never used  Substance and Sexual Activity   Alcohol use: No    Alcohol/week: 0.0 standard drinks   Drug use: No   Sexual activity: Not Currently  Other Topics Concern   Not on file  Social History Narrative   Lives in [place] with husband. 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 No pets.   Social Determinants of Health   Financial Resource Strain: Low Risk    Difficulty of Paying Living Expenses: Not hard at all  Food Insecurity: No Food Insecurity   Worried About Charity fundraiser in the Last Year:  Never true   [place] in the Last Year: Never true  Transportation Needs: No Transportation Needs   Lack of Transportation (Medical): No   Lack of Transportation (Non-Medical): No  Physical Activity:    Days of Exercise per Week: Not on file   Minutes of Exercise per Session: Not on file  Stress: No Stress Concern Present   Feeling of Stress : Not at  all  Social Connections: Engineer, building services of Communication with Friends and Family: More than three times a week   Frequency of Social Gatherings with Friends and Family: More than three times a week   Attends Religious Services: More than 4 times per year   Active Member of [employer] or Organizations: Yes   Attends Music therapist: More than 4 times per year   Marital Status: Married  Human resources officer Violence: Not At Risk   Fear of Current or Ex-Partner: No   Emotionally Abused: No   Physically Abused: No   Sexually Abused: No    Review of Systems: See HPI, otherwise negative ROS  Physical Exam: BP (!) 144/79    Pulse 89    Temp 98.1 F (36.7 C) (Temporal)    Ht 5\' 5"  (1.651 m)    Wt 93 kg    SpO2 94%    BMI 34.11 kg/m  General:   Alert,  pleasant and cooperative in NAD Head:  Normocephalic and atraumatic. Lungs:  Clear to auscultation.    Heart:  Regular rate and rhythm.  Impression/Plan: JANNETTE COTHAM is here for cataract surgery.  Risks, benefits, limitations, and alternatives regarding cataract surgery have been reviewed with the patient.  Questions have been answered.  All parties agreeable.   Birder Robson, MD  06/21/2020, 9:10 AM

## 2020-06-21 NOTE — Anesthesia Procedure Notes (Signed)
Procedure Name: MAC Date/Time: 06/21/2020 9:28 AM Performed by: Jeannene Patella, CRNA Pre-anesthesia Checklist: Patient identified, Emergency Drugs available, Suction available, Timeout performed and Patient being monitored Patient Re-evaluated:Patient Re-evaluated prior to induction Oxygen Delivery Method: Nasal cannula Placement Confirmation: positive ETCO2

## 2020-06-21 NOTE — Transfer of Care (Signed)
Immediate Anesthesia Transfer of Care Note  Patient: Betty [name]  Procedure(s) Performed: CATARACT EXTRACTION PHACO AND INTRAOCULAR LENS PLACEMENT (IOC) RIGHT DIABETIC (Right Eye)  Patient Location: PACU  Anesthesia Type: MAC  Level of Consciousness: awake, alert  and patient cooperative  Airway and Oxygen Therapy: Patient Spontanous Breathing and Patient connected to supplemental oxygen  Post-op Assessment: Post-op Vital signs reviewed, Patient's Cardiovascular Status Stable, Respiratory Function Stable, Patent Airway and No signs of Nausea or vomiting  Post-op Vital Signs: Reviewed and stable  Complications: No complications documented.

## 2020-06-22 ENCOUNTER — Encounter: Payer: Self-pay | Admitting: Ophthalmology

## 2020-06-30 DIAGNOSIS — H2512 Age-related nuclear cataract, left eye: Secondary | ICD-10-CM | POA: Diagnosis not present

## 2020-06-30 DIAGNOSIS — G4733 Obstructive sleep apnea (adult) (pediatric): Secondary | ICD-10-CM | POA: Diagnosis not present

## 2020-07-04 DIAGNOSIS — J9610 Chronic respiratory failure, unspecified whether with hypoxia or hypercapnia: Secondary | ICD-10-CM | POA: Diagnosis not present

## 2020-07-06 ENCOUNTER — Other Ambulatory Visit: Payer: Self-pay

## 2020-07-06 ENCOUNTER — Encounter: Payer: Self-pay | Admitting: Ophthalmology

## 2020-07-08 ENCOUNTER — Other Ambulatory Visit: Payer: Self-pay

## 2020-07-08 ENCOUNTER — Other Ambulatory Visit
Admission: RE | Admit: 2020-07-08 | Discharge: 2020-07-08 | Disposition: A | Discharge status: Home / Self Care | Payer: Medicare PPO | Source: Ambulatory Visit | Attending: Ophthalmology | Admitting: Ophthalmology

## 2020-07-08 DIAGNOSIS — Z01812 Encounter for preprocedural laboratory examination: Secondary | ICD-10-CM | POA: Diagnosis not present

## 2020-07-08 DIAGNOSIS — Z20822 Contact with and (suspected) exposure to covid-19: Secondary | ICD-10-CM | POA: Insufficient documentation

## 2020-07-09 LAB — SARS CORONAVIRUS 2 (TAT 6-24 HRS): SARS Coronavirus 2: NEGATIVE

## 2020-07-11 NOTE — Discharge Instructions (Signed)

## 2020-07-12 ENCOUNTER — Ambulatory Visit: Payer: Medicare PPO | Admitting: Anesthesiology

## 2020-07-12 ENCOUNTER — Encounter: Payer: Self-pay | Admitting: Ophthalmology

## 2020-07-12 ENCOUNTER — Other Ambulatory Visit: Payer: Self-pay

## 2020-07-12 ENCOUNTER — Ambulatory Visit
Admission: RE | Admit: 2020-07-12 | Discharge: 2020-07-12 | Disposition: A | Discharge status: Home / Self Care | Payer: Medicare PPO | Attending: Ophthalmology | Admitting: Ophthalmology

## 2020-07-12 ENCOUNTER — Encounter
Admission: RE | Disposition: A | Discharge status: Home / Self Care | Payer: Self-pay | Source: Home / Self Care | Attending: Ophthalmology

## 2020-07-12 DIAGNOSIS — H25812 Combined forms of age-related cataract, left eye: Secondary | ICD-10-CM | POA: Diagnosis not present

## 2020-07-12 DIAGNOSIS — Z888 Allergy status to other drugs, medicaments and biological substances status: Secondary | ICD-10-CM | POA: Diagnosis not present

## 2020-07-12 DIAGNOSIS — Z7984 Long term (current) use of oral hypoglycemic drugs: Secondary | ICD-10-CM | POA: Insufficient documentation

## 2020-07-12 DIAGNOSIS — Z833 Family history of diabetes mellitus: Secondary | ICD-10-CM | POA: Insufficient documentation

## 2020-07-12 DIAGNOSIS — H2512 Age-related nuclear cataract, left eye: Secondary | ICD-10-CM | POA: Insufficient documentation

## 2020-07-12 DIAGNOSIS — Z85828 Personal history of other malignant neoplasm of skin: Secondary | ICD-10-CM | POA: Insufficient documentation

## 2020-07-12 DIAGNOSIS — E1136 Type 2 diabetes mellitus with diabetic cataract: Secondary | ICD-10-CM | POA: Insufficient documentation

## 2020-07-12 DIAGNOSIS — Z880 Allergy status to penicillin: Secondary | ICD-10-CM | POA: Insufficient documentation

## 2020-07-12 DIAGNOSIS — Z79899 Other long term (current) drug therapy: Secondary | ICD-10-CM | POA: Diagnosis not present

## 2020-07-12 DIAGNOSIS — Z7982 Long term (current) use of aspirin: Secondary | ICD-10-CM | POA: Insufficient documentation

## 2020-07-12 DIAGNOSIS — Z882 Allergy status to sulfonamides status: Secondary | ICD-10-CM | POA: Insufficient documentation

## 2020-07-12 DIAGNOSIS — Z9049 Acquired absence of other specified parts of digestive tract: Secondary | ICD-10-CM | POA: Diagnosis not present

## 2020-07-12 HISTORY — PX: CATARACT EXTRACTION W/PHACO: SHX586

## 2020-07-12 LAB — GLUCOSE, CAPILLARY
Glucose-Capillary: 125 mg/dL — ABNORMAL HIGH (ref 70–99)
Glucose-Capillary: 134 mg/dL — ABNORMAL HIGH (ref 70–99)

## 2020-07-12 SURGERY — PHACOEMULSIFICATION, CATARACT, WITH IOL INSERTION
Anesthesia: Monitor Anesthesia Care | Site: Eye | Laterality: Left

## 2020-07-12 MED ORDER — FENTANYL CITRATE (PF) 100 MCG/2ML IJ SOLN
INTRAMUSCULAR | Status: DC | PRN
  Administered 2020-07-12: 100 ug via INTRAVENOUS

## 2020-07-12 MED ORDER — NA CHONDROIT SULF-NA HYALURON 40-17 MG/ML IO SOLN
INTRAOCULAR | Status: DC | PRN
  Administered 2020-07-12: 1 mL via INTRAOCULAR

## 2020-07-12 MED ORDER — BRIMONIDINE TARTRATE-TIMOLOL 0.2-0.5 % OP SOLN
OPHTHALMIC | Status: DC | PRN
  Administered 2020-07-12: 1 [drp] via OPHTHALMIC

## 2020-07-12 MED ORDER — ACETAMINOPHEN 10 MG/ML IV SOLN: 1000.0000 mg | Freq: Once | INTRAVENOUS | Status: DC | PRN

## 2020-07-12 MED ORDER — TETRACAINE HCL 0.5 % OP SOLN
1.0000 [drp] | OPHTHALMIC | Status: DC | PRN
  Administered 2020-07-12 (×3): 1 [drp] via OPHTHALMIC

## 2020-07-12 MED ORDER — MIDAZOLAM HCL 2 MG/2ML IJ SOLN
INTRAMUSCULAR | Status: DC | PRN
  Administered 2020-07-12: 2 mg via INTRAVENOUS

## 2020-07-12 MED ORDER — LIDOCAINE HCL (PF) 2 % IJ SOLN
INTRAOCULAR | Status: DC | PRN
  Administered 2020-07-12: 2 mL

## 2020-07-12 MED ORDER — MOXIFLOXACIN HCL 0.5 % OP SOLN
OPHTHALMIC | Status: DC | PRN
  Administered 2020-07-12: 0.2 mL via OPHTHALMIC

## 2020-07-12 MED ORDER — ARMC OPHTHALMIC DILATING DROPS
1.0000 "application " | OPHTHALMIC | Status: DC | PRN
  Administered 2020-07-12 (×3): 1 via OPHTHALMIC

## 2020-07-12 MED ORDER — EPINEPHRINE PF 1 MG/ML IJ SOLN
INTRAOCULAR | Status: DC | PRN
  Administered 2020-07-12: 43 mL via OPHTHALMIC

## 2020-07-12 MED ORDER — LACTATED RINGERS IV SOLN: INTRAVENOUS | Status: DC

## 2020-07-12 MED ORDER — ONDANSETRON HCL 4 MG/2ML IJ SOLN: 4.0000 mg | Freq: Once | INTRAMUSCULAR | Status: DC | PRN

## 2020-07-12 SURGICAL SUPPLY — 20 items
CANNULA ANT/CHMB 27G (MISCELLANEOUS) ×2 IMPLANT
CANNULA ANT/CHMB 27GA (MISCELLANEOUS) ×6 IMPLANT
GLOVE SURG LX 8.0 MICRO (GLOVE) ×2
GLOVE SURG LX STRL 8.0 MICRO (GLOVE) ×1 IMPLANT
GLOVE SURG TRIUMPH 8.0 PF LTX (GLOVE) ×3 IMPLANT
GOWN STRL REUS W/ TWL LRG LVL3 (GOWN DISPOSABLE) ×2 IMPLANT
GOWN STRL REUS W/TWL LRG LVL3 (GOWN DISPOSABLE) ×6
LENS IOL ACRSF VT TRC 515 20.5 IMPLANT
LENS IOL ACRYSOF VIVITY 20.5 ×3 IMPLANT
LENS IOL VIVITY 515 20.5 ×1 IMPLANT
MARKER SKIN DUAL TIP RULER LAB (MISCELLANEOUS) ×3 IMPLANT
NDL FILTER BLUNT 18X1 1/2 (NEEDLE) ×1 IMPLANT
NEEDLE FILTER BLUNT 18X 1/2SAF (NEEDLE) ×2
NEEDLE FILTER BLUNT 18X1 1/2 (NEEDLE) ×1 IMPLANT
PACK OPTHALMIC (MISCELLANEOUS) ×3 IMPLANT
PACK PORFILIO (MISCELLANEOUS) ×3 IMPLANT
SYR 3ML LL SCALE MARK (SYRINGE) ×3 IMPLANT
SYR TB 1ML LUER SLIP (SYRINGE) ×3 IMPLANT
WATER STERILE IRR 250ML POUR (IV SOLUTION) ×3 IMPLANT
WIPE NON LINTING 3.25X3.25 (MISCELLANEOUS) ×3 IMPLANT

## 2020-07-12 NOTE — Transfer of Care (Signed)
Immediate Anesthesia Transfer of Care Note  Patient: Betty [name]  Procedure(s) Performed: CATARACT EXTRACTION PHACO AND INTRAOCULAR LENS PLACEMENT (IOC) LEFT DIABETIC 6.96 00:46.3 (Left Eye)  Patient Location: PACU  Anesthesia Type: MAC  Level of Consciousness: awake, alert  and patient cooperative  Airway and Oxygen Therapy: Patient Spontanous Breathing and Patient connected to supplemental oxygen  Post-op Assessment: Post-op Vital signs reviewed, Patient's Cardiovascular Status Stable, Respiratory Function Stable, Patent Airway and No signs of Nausea or vomiting  Post-op Vital Signs: Reviewed and stable  Complications: No complications documented.

## 2020-07-12 NOTE — Anesthesia Postprocedure Evaluation (Signed)
Anesthesia Post Note  Patient: Betty [name]  Procedure(s) Performed: CATARACT EXTRACTION PHACO AND INTRAOCULAR LENS PLACEMENT (IOC) LEFT DIABETIC 6.96 00:46.3 (Left Eye)     Patient location during evaluation: PACU Anesthesia Type: MAC Level of consciousness: awake and alert Pain management: pain level controlled Vital Signs Assessment: post-procedure vital signs reviewed and stable Respiratory status: spontaneous breathing, nonlabored ventilation, respiratory function stable and patient connected to nasal cannula oxygen Cardiovascular status: stable and blood pressure returned to baseline Postop Assessment: no apparent nausea or vomiting Anesthetic complications: no   No complications documented.  Karena Kinker A  Edrian Melucci

## 2020-07-12 NOTE — Anesthesia Preprocedure Evaluation (Signed)
Anesthesia Evaluation  Patient identified by MRN, date of birth, ID band Patient awake    Reviewed: Allergy & Precautions, NPO status , Patient's Chart, lab work & pertinent test results, reviewed documented beta blocker date and time   History of Anesthesia Complications (+) PONV and history of anesthetic complications  Airway Mallampati: III  TM Distance: <3 FB Neck ROM: Full    Dental   Pulmonary sleep apnea and Oxygen sleep apnea ,   Chronic hypoxemic respiratory failure Pulm HTN (mean PA pressure 26) Mixed obstructive & restrictive lung disease with markedly depressed DLCO at 35% predicted   breath sounds clear to auscultation       Cardiovascular hypertension, (-) angina+CHF (NICM) and + DOE (Chronic, stable)   Rhythm:Regular Rate:Normal   HLD  TTE (2019): LV mildly dilated, LVEF 55-60, grade 1 diastolic dysfunction RV function normal   Neuro/Psych  Neuromuscular disease (Cervical radiculopathy, sciatica)    GI/Hepatic neg GERD  , Diverticulosis   Endo/Other  diabetes  Renal/GU      Musculoskeletal  (+) Arthritis ,   Abdominal (+) + obese (BMI 34),   Peds  Hematology   Anesthesia Other Findings Gout Skin cancer   Reproductive/Obstetrics                           Anesthesia Physical Anesthesia Plan  ASA: III  Anesthesia Plan: MAC   Post-op Pain Management:    Induction: Intravenous  PONV Risk Score and Plan: 3 and TIVA, Midazolam and Treatment may vary due to age or medical condition  Airway Management Planned: Nasal Cannula  Additional Equipment:   Intra-op Plan:   Post-operative Plan:   Informed Consent: I have reviewed the patients History and Physical, chart, labs and discussed the procedure including the risks, benefits and alternatives for the proposed anesthesia with the patient or authorized representative who has indicated his/her understanding and  acceptance.       Plan Discussed with: CRNA and Anesthesiologist  Anesthesia Plan Comments:        Anesthesia Quick Evaluation  

## 2020-07-12 NOTE — Anesthesia Procedure Notes (Signed)
Procedure Name: MAC Date/Time: 07/12/2020 8:59 AM Performed by: Silvana Newness, CRNA Pre-anesthesia Checklist: Patient identified, Emergency Drugs available, Suction available, Patient being monitored and Timeout performed Patient Re-evaluated:Patient Re-evaluated prior to induction Oxygen Delivery Method: Nasal cannula Placement Confirmation: positive ETCO2

## 2020-07-12 NOTE — Op Note (Signed)
PREOPERATIVE DIAGNOSIS:  Nuclear sclerotic cataract of the left eye.   POSTOPERATIVE DIAGNOSIS:  Nuclear sclerotic cataract of the left eye.   OPERATIVE PROCEDURE: Procedure(s): CATARACT EXTRACTION PHACO AND INTRAOCULAR LENS PLACEMENT (IOC) LEFT DIABETIC 6.96 00:46.3   SURGEON:  Birder Robson, MD.   ANESTHESIA: 1.      Managed anesthesia care. 2.     0.48ml os Shugarcaine was instilled following the paracentesis [email or web address]   COMPLICATIONS:  None.   TECHNIQUE:   Stop and chop    DESCRIPTION OF PROCEDURE:  The patient was examined and consented in the preoperative holding area where the aforementioned topical anesthesia was applied to the left eye.  The patient was brought back to the Operating Room where he was sat upright on the gurney and given a target to fixate upon while the eye was marked at the 3:00 and 9:00 position.  The patient was then reclined on the operating table.  The eye was prepped and draped in the usual sterile ophthalmic fashion and a lid speculum was placed. A paracentesis was created with the side port blade and the anterior chamber was filled with viscoelastic. A near clear corneal incision was performed with the steel keratome. A continuous curvilinear capsulorrhexis was performed with a cystotome followed by the capsulorrhexis forceps. Hydrodissection and hydrodelineation were carried out with BSS on a blunt cannula. The lens was removed in a stop and chop technique and the remaining cortical material was removed with the irrigation-aspiration handpiece. The eye was inflated with viscoelastic and the DFT lens was placed in the eye and rotated to within a few degrees of the predetermined orientation.  The remaining viscoelastic was removed from the eye.  The Sinskey hook was used to rotate the toric lens into its final resting place at 173 degrees.  0.1 ml of Vigamox was placed in the anterior chamber. The eye was inflated to a physiologic pressure and found to be  watertight.  The eye was dressed with Combigan. The patient was given protective glasses to wear throughout the day and a shield with which to sleep tonight. The patient was also given drops with which to begin a drop regimen today and will follow-up with me in one day. Implant Name Type Inv. Item Serial No. Manufacturer Lot No. LRB No. Used Action  LENS IOL ACRYSOF VIVITY 20.5 - [id number]  LENS IOL ACRYSOF VIVITY 20.5 [id number] ALCON  Left 1 Implanted   Procedure(s) with comments: CATARACT EXTRACTION PHACO AND INTRAOCULAR LENS PLACEMENT (IOC) LEFT DIABETIC 6.96 00:46.3 (Left) - Diabetic - oral meds  Electronically signed: Birder Robson 11/16/20219:11 AM

## 2020-07-12 NOTE — H&P (Signed)
[facility]   Primary Care Physician:  Leone Haven, MD Ophthalmologist: Dr. George Ina  Pre-Procedure History & Physical: HPI:  Betty [name] is a 74 y.o. female here for cataract surgery.   Past Medical History:  Diagnosis Date  . Allergic rhinitis    never tested, fall and spring  . Arthritis    hands,knees, feet  . Cataracts, bilateral    not surgical yet  . CHOLECYSTECTOMY, HX OF 10/08/2007   Qualifier: Diagnosis of  By: Council Mechanic MD, [name]   . Chronic diastolic CHF (congestive heart failure) ([place])   . Chronic respiratory failure ([place])   . Diabetes mellitus without complication ([place])   . Diverticulosis    problems with frequent gas, burping  . Glucose intolerance (impaired glucose tolerance)   . Gout   . History of chicken pox   . Hyperlipidemia   . Hypertension   . NICM (nonischemic cardiomyopathy) ([place]) 2002   EF 25%; improved to normal - echo 4/08: EF 50-55%, mild MR, mild LAE, mild TR;    cath 3/03: normal cors, EF 40%  . Obesity   . Oxygen deficiency 2017   at night  . PONV (postoperative nausea and vomiting)    after wisdom tooth extraction  . Restrictive lung disease   . Skin cancer    skin cancers only  . Sleep apnea    cpap settings 4    Past Surgical History:  Procedure Laterality Date  . BILATERAL VATS ABLATION Right    biopsy done 2015- [facility]  . BREAST EXCISIONAL BIOPSY Left 80's   NEG  . CARDIAC CATHETERIZATION    . CATARACT EXTRACTION W/PHACO Right 06/21/2020   Procedure: CATARACT EXTRACTION PHACO AND INTRAOCULAR LENS PLACEMENT ([place]) RIGHT DIABETIC;  Surgeon: Birder Robson, MD;  Location: [facility];  Service: Ophthalmology;  Laterality: Right;  4.06 0:32.8  . CHOLECYSTECTOMY    . choleystectomy  02/2002  . COLONOSCOPY WITH PROPOFOL N/A 12/20/2014   Procedure: COLONOSCOPY WITH PROPOFOL;  Surgeon: [name], MD;  [facility];  Service: Gastroenterology;  Laterality: N/A;  . COLONOSCOPY WITH PROPOFOL  N/A 03/22/2020   Procedure: COLONOSCOPY WITH PROPOFOL;  Surgeon: [name], MD;  [facility];  Service: Gastroenterology;  Laterality: N/A;  . CYSTECTOMY  1996   l breast  . DILATION AND CURETTAGE OF UTERUS  2011   Dr. Hulan Fray at [facility]  . [place]    . nsvd     x 2  . POLYPECTOMY  03/22/2020   Procedure: POLYPECTOMY;  Surgeon: [name], MD;  Location: [facility];  Service: Gastroenterology;;  . [place]  . VAGINAL DELIVERY     x2    Prior to Admission medications   Medication Sig Start Date End Date Taking? Authorizing Provider  aspirin 81 MG EC tablet Take 1 tablet (81 mg total) by mouth daily. 09/19/17  Yes Shirley Friar, PA-C  CALCIUM-MAGNESIUM-VITAMIN D PO Take 1 tablet by mouth daily.   Yes [provider]  carvedilol (COREG) 12.5 MG tablet TAKE 1 TABLET BY MOUTH TWICE A DAY WITH MEALS Patient taking differently: Take 12.5 mg by mouth 2 (two) times daily with a meal. TAKE 1 TABLET BY MOUTH TWICE A DAY WITH MEALS 01/01/20  Yes Turner, Traci R, MD  Coenzyme Q10 (CO Q 10 PO) Take 200 mg by mouth at bedtime.    Yes [provider]  Fish Oil OIL Take 1,200 mg by mouth 2 (  two) times daily. GUMMIES   Yes [provider]  losartan (COZAAR) 50 MG tablet Take 0.5 tablets (25 mg total) by mouth daily. 10/14/19  Yes Leone Haven, MD  metFORMIN (GLUCOPHAGE-XR) 500 MG 24 hr tablet TAKE 1 TABLET BY MOUTH EVERY DAY WITH BREAKFAST 05/24/20  Yes Leone Haven, MD  multivitamin [facility]) per tablet Take 1 tablet by mouth daily.    Yes [provider]  polyvinyl alcohol (LIQUIFILM TEARS) 1.4 % ophthalmic solution Place 1 drop into both eyes every morning.    [provider]  torsemide (DEMADEX) 10 MG tablet Take 1 tablet (10 mg total) by mouth daily. Patient not taking: Reported on 06/14/2020 12/22/19   [name], MD    Allergies as of 06/28/2020 - Review Complete 06/21/2020  Allergen  Reaction Noted  . Amoxicillin Rash 10/08/2007  . Etodolac Other (See Comments) 10/21/2013  . Hydrochlorothiazide Other (See Comments) 10/21/2013  . Meloxicam Other (See Comments) 10/21/2013  . Sulfa antibiotics Other (See Comments) 02/19/2013  . Allopurinol  03/17/2018  . Metformin and related  04/21/2015  . Rosuvastatin Other (See Comments) 02/22/2020  . Sulfonamide derivatives  10/08/2007  . Jardiance [empagliflozin] Other (See Comments) 02/22/2020  . Lasix [furosemide] Other (See Comments) 02/22/2020    Family History  Problem Relation Age of Onset  . Lymphoma Mother 28  . COPD Father        was a smoker  . Stroke Father   . Pneumonia Father   . Diabetes Father   . Hyperlipidemia Father   . Heart disease Father   . Heart attack Father   . Hypertension Father   . Diabetes Sister   . Depression Sister   . Meniere's disease Brother   . Diabetes Paternal Grandfather   . Heart disease Paternal Grandfather   . Schizophrenia Daughter   . Bladder Cancer Maternal Grandmother   . Leukemia Maternal Grandfather   . Hypertension Brother   . Colon cancer Neg Hx   . Stomach cancer Neg Hx   . Breast cancer Neg Hx   . Colon polyps Neg Hx   . Esophageal cancer Neg Hx   . Rectal cancer Neg Hx     Social History   Socioeconomic History  . Marital status: Married    Spouse name: Not on file  . Number of children: 2  . Years of education: Not on file  . Highest education level: Not on file  Occupational History  . Occupation: Retired Product manager: retired  . Occupation: Works at [employer]  . Smoking status: Never Smoker  . Smokeless tobacco: Never Used  Vaping Use  . Vaping Use: Never used  Substance and Sexual Activity  . Alcohol use: No    Alcohol/week: 0.0 standard drinks  . Drug use: No  . Sexual activity: Not Currently  Other Topics Concern  . Not on file  Social History Narrative   Lives in [place] with husband. 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 No pets.   Social Determinants of Health   Financial Resource Strain: Low Risk   . Difficulty of Paying Living Expenses: Not hard at all  Food Insecurity: No Food Insecurity  . Worried About Charity fundraiser in the Last Year: Never true  . Ran Out of Food in the Last Year: Never true  Transportation Needs: No Transportation Needs  . Lack of Transportation (Medical): No  . Lack of Transportation (Non-Medical):  No  Physical Activity:   . Days of Exercise per Week: Not on file  . Minutes of Exercise per Session: Not on file  Stress: No Stress Concern Present  . Feeling of Stress : Not at all  Social Connections: Socially Integrated  . Frequency of Communication with Friends and Family: More than three times a week  . Frequency of Social Gatherings with Friends and Family: More than three times a week  . Attends Religious Services: More than 4 times per year  . Active Member of Clubs or Organizations: Yes  . Attends Archivist Meetings: More than 4 times per year  . Marital Status: Married  Human resources officer Violence: Not At Risk  . Fear of Current or Ex-Partner: No  . Emotionally Abused: No  . Physically Abused: No  . Sexually Abused: No    Review of Systems: See HPI, otherwise negative ROS  Physical Exam: BP 129/73   Pulse 92   Temp (!) 97.5 F (36.4 C)   Ht 5\' 5"  (1.651 m)   Wt 93 kg   SpO2 95%   BMI 34.11 kg/m  General:   Alert,  pleasant and cooperative in NAD Head:  Normocephalic and atraumatic. Respiratory:  Normal work of breathing. Heart:  Regular rate and rhythm.  Impression/Plan: Betty [name] is here for cataract surgery.  Risks, benefits, limitations, and alternatives regarding cataract surgery have been reviewed with the patient.  Questions have been answered.  All parties agreeable.   Birder Robson, MD  07/12/2020, 8:44 AM

## 2020-07-13 ENCOUNTER — Encounter: Payer: Self-pay | Admitting: Ophthalmology

## 2020-08-03 DIAGNOSIS — J9610 Chronic respiratory failure, unspecified whether with hypoxia or hypercapnia: Secondary | ICD-10-CM | POA: Diagnosis not present

## 2020-08-11 ENCOUNTER — Other Ambulatory Visit: Payer: Self-pay

## 2020-08-15 ENCOUNTER — Other Ambulatory Visit (HOSPITAL_COMMUNITY)
Admission: RE | Admit: 2020-08-15 | Discharge: 2020-08-15 | Disposition: A | Discharge status: Home / Self Care | Payer: Medicare PPO | Source: Ambulatory Visit | Attending: Family Medicine | Admitting: Family Medicine

## 2020-08-15 ENCOUNTER — Encounter: Payer: Self-pay | Admitting: Family Medicine

## 2020-08-15 ENCOUNTER — Ambulatory Visit: Payer: Medicare PPO | Admitting: Family Medicine

## 2020-08-15 ENCOUNTER — Other Ambulatory Visit: Payer: Self-pay

## 2020-08-15 VITALS — BP 130/70 | HR 90 | Temp 98.1°F | Ht 65.0 in | Wt 207.0 lb

## 2020-08-15 DIAGNOSIS — R21 Rash and other nonspecific skin eruption: Secondary | ICD-10-CM | POA: Diagnosis not present

## 2020-08-15 DIAGNOSIS — N898 Other specified noninflammatory disorders of vagina: Secondary | ICD-10-CM | POA: Insufficient documentation

## 2020-08-15 DIAGNOSIS — G4733 Obstructive sleep apnea (adult) (pediatric): Secondary | ICD-10-CM | POA: Diagnosis not present

## 2020-08-15 DIAGNOSIS — J301 Allergic rhinitis due to pollen: Secondary | ICD-10-CM | POA: Diagnosis not present

## 2020-08-15 DIAGNOSIS — I5032 Chronic diastolic (congestive) heart failure: Secondary | ICD-10-CM

## 2020-08-15 DIAGNOSIS — B372 Candidiasis of skin and nail: Secondary | ICD-10-CM | POA: Insufficient documentation

## 2020-08-15 DIAGNOSIS — E782 Mixed hyperlipidemia: Secondary | ICD-10-CM

## 2020-08-15 DIAGNOSIS — R609 Edema, unspecified: Secondary | ICD-10-CM

## 2020-08-15 DIAGNOSIS — E1121 Type 2 diabetes mellitus with diabetic nephropathy: Secondary | ICD-10-CM | POA: Diagnosis not present

## 2020-08-15 LAB — TSH: TSH: 5.67 u[IU]/mL — ABNORMAL HIGH (ref 0.35–4.50)

## 2020-08-15 LAB — COMPREHENSIVE METABOLIC PANEL
ALT: 17 U/L (ref 0–35)
AST: 16 U/L (ref 0–37)
Albumin: 3.9 g/dL (ref 3.5–5.2)
Alkaline Phosphatase: 74 U/L (ref 39–117)
BUN: 16 mg/dL (ref 6–23)
CO2: 34 mEq/L — ABNORMAL HIGH (ref 19–32)
Calcium: 9.9 mg/dL (ref 8.4–10.5)
Chloride: 100 mEq/L (ref 96–112)
Creatinine, Ser: 0.6 mg/dL (ref 0.40–1.20)
GFR: 88.99 mL/min (ref >60.00)
Glucose, Bld: 125 mg/dL — ABNORMAL HIGH (ref 70–99)
Potassium: 4.2 mEq/L (ref 3.5–5.1)
Sodium: 141 mEq/L (ref 135–145)
Total Bilirubin: 0.4 mg/dL (ref 0.2–1.2)
Total Protein: 7.2 g/dL (ref 6.0–8.3)

## 2020-08-15 LAB — LDL CHOLESTEROL, DIRECT: Direct LDL: 105 mg/dL

## 2020-08-15 LAB — CBC
HCT: 41.8 % (ref 36.0–46.0)
Hemoglobin: 14.2 g/dL (ref 12.0–15.0)
MCHC: 33.9 g/dL (ref 30.0–36.0)
MCV: 97.3 fl (ref 78.0–100.0)
Platelets: 231 10*3/uL (ref 150.0–400.0)
RBC: 4.3 Mil/uL (ref 3.87–5.11)
RDW: 13.3 % (ref 11.5–15.5)
WBC: 7.3 10*3/uL (ref 4.0–10.5)

## 2020-08-15 LAB — LIPID PANEL
Cholesterol: 206 mg/dL — ABNORMAL HIGH (ref 0–200)
HDL: 70.6 mg/dL (ref >39.00)
NonHDL: 135.72
Total CHOL/HDL Ratio: 3
Triglycerides: 217 mg/dL — ABNORMAL HIGH (ref 0.0–149.0)
VLDL: 43.4 mg/dL — ABNORMAL HIGH (ref 0.0–40.0)

## 2020-08-15 LAB — HEMOGLOBIN A1C: Hgb A1c MFr Bld: 7.2 % — ABNORMAL HIGH (ref 4.6–6.5)

## 2020-08-15 MED ORDER — NYSTATIN 100000 UNIT/GM EX CREA: 1.0000 "application " | TOPICAL_CREAM | Freq: Two times a day (BID) | CUTANEOUS | 0 refills | Status: DC

## 2020-08-15 MED ORDER — PRAVASTATIN SODIUM 20 MG PO TABS: 20.0000 mg | ORAL_TABLET | ORAL | 3 refills | Status: DC

## 2020-08-15 NOTE — Assessment & Plan Note (Signed)
Check A1c.  Continue Metformin XR 500 mg daily.

## 2020-08-15 NOTE — Assessment & Plan Note (Addendum)
Check lipid panel.  Will start on pravastatin 20 mg once weekly and see if she can tolerate this given prior history of significant arthralgias related to Crestor use.

## 2020-08-15 NOTE — Progress Notes (Signed)
Betty Sonnenberg, MD Phone: [phone]  Betty [name] is a 74 y.o. female who presents today for follow-up.  Hyperlipidemia: Denies chest pain.  She is cooking more at home.  Some fast food.  Eats a good amount of fruits and vegetables.  Does drink 2 diet sodas per day.  No exercise given chronic pulmonary and cardiovascular issues.  In the past she tried Crestor and had significant joint pain with this.  Hypertension: Not checking blood pressure.  Taking losartan.  No chest pain, shortness of breath.  Does note some edema left greater than right that has been an intermittent chronic issue.  Slightly worse over the last month and a half.  No pain associated with this.  Notes it is pretty consistent.  Notes it has occurred in the past.  OSA: Does note hypersomnia.  Does not wake up well rested.  Uses [employer] as her DME company.  Allergic rhinitis: Notes chronic postnasal drip with some sneezing and rhinorrhea.  Occasionally she will take Allegra which is beneficial.  Diabetes: Not checking sugars.  Taking Metformin.  No polyuria or polydipsia.  No hypoglycemia.  Genital rash: This has been an intermittent ongoing issue for the last 1.5-2 years.  She was seen by GYN for postmenopausal bleeding.  She had an endometrial biopsy which revealed atrophic endometrial glands.  No atypia or evidence of malignancy.  She notes since then she has had bumps on her vaginal area.  She notes they weep and there is some slight blood-tinged to the weepiness.  Denies vaginal bleeding.  Social History   Tobacco Use  Smoking Status Never Smoker  Smokeless Tobacco Never Used     ROS see history of present illness  Objective  Physical Exam Vitals:   08/15/20 0811  BP: 130/70  Pulse: 90  Temp: 98.1 F (36.7 C)  SpO2: 90%    BP Readings from Last 3 Encounters:  08/15/20 130/70  07/12/20 121/68  06/21/20 (!) 144/79   Wt Readings from Last 3 Encounters:  08/15/20 207 lb (93.9 kg)  07/12/20 205  lb (93 kg)  06/21/20 205 lb (93 kg)    Physical Exam Constitutional:      General: She is not in acute distress.    Appearance: She is not diaphoretic.  Cardiovascular:     Rate and Rhythm: Normal rate and regular rhythm.     Heart sounds: Normal heart sounds.  Pulmonary:     Effort: Pulmonary effort is normal.     Breath sounds: Normal breath sounds.  Genitourinary:    Comments: Betty [name], CMA served as chaperone, slight irritation where her inguinal area meets her labia, there are some small cracks in the skin in this area, no hypopigmentation, slightly atrophic vaginal mucosa, thin white discharge noted Musculoskeletal:        General: No edema.     Comments: Nonpitting edema bilateral lower extremities left greater than right  Skin:    General: Skin is warm and dry.  Neurological:     Mental Status: She is alert.      Assessment/Plan: Please see individual problem list.  Problem List Items Addressed This Visit    ALLERGIC RHINITIS, SEASONAL    I encouraged her to try Allegra and/or Flonase for the symptoms.  If not improving she will let us know.      Candidal intertrigo    Concern for possible candidal intertrigo.  We will treat with nystatin.  Will refer back to GYN.        Relevant Medications   nystatin cream (MYCOSTATIN)   Chronic diastolic heart failure (HCC) (Chronic)    Swelling may be related to this.  We will get labs to evaluate for other causes.      Relevant Medications   pravastatin (PRAVACHOL) 20 MG tablet   Diabetes mellitus type 2, controlled ([place]) - Primary (Chronic)    Check A1c.  Continue Metformin XR 500 mg daily.      Relevant Medications   pravastatin (PRAVACHOL) 20 MG tablet   Other Relevant Orders   HgB A1c   Edema    Chronic ongoing issue.  Possible venous insufficiency or heart failure related.  We will check labs as outlined.      Relevant Orders   TSH   CBC   HYPERLIPIDEMIA, MIXED    Check lipid panel.  Will start on  pravastatin 20 mg once weekly and see if she can tolerate this given prior history of significant arthralgias related to Crestor use.      Relevant Medications   pravastatin (PRAVACHOL) 20 MG tablet   Other Relevant Orders   Comp Met (CMET)   Lipid panel   Hepatic function panel   LDL cholesterol, direct   Obstructive sleep apnea (Chronic)    Concerned that this may be poorly treated with her current CPAP.  We will request a compliance report.      Vaginal discharge    Undetermined cause.  Will test for BV and Candida.      Relevant Orders   Cervicovaginal ancillary only( [place])    Other Visit Diagnoses    Rash of genital area       Relevant Orders   Ambulatory referral to Gynecology      This visit occurred during the SARS-CoV-2 public health emergency.  Safety protocols were in place, including screening questions prior to the visit, additional usage of staff PPE, and extensive cleaning of exam room while observing appropriate contact time as indicated for disinfecting solutions.   I have spent 42 minutes in the care of this patient regarding history taking, physical exam, documentation, discussion of plan, placing orders.   Tommi Rumps, MD [place]

## 2020-08-15 NOTE — Assessment & Plan Note (Signed)
Chronic ongoing issue.  Possible venous insufficiency or heart failure related.  We will check labs as outlined.

## 2020-08-15 NOTE — Assessment & Plan Note (Signed)
I encouraged her to try Allegra and/or Flonase for the symptoms.  If not improving she will let us know.

## 2020-08-15 NOTE — Patient Instructions (Signed)
Nice to see you. We will try nystatin for the rash around her vagina. We will get lab work today and contact you with the results. We will contact you with the results of the swab. We will get you into see GYN.

## 2020-08-15 NOTE — Assessment & Plan Note (Signed)
Swelling may be related to this.  We will get labs to evaluate for other causes.

## 2020-08-15 NOTE — Assessment & Plan Note (Signed)
Undetermined cause.  Will test for BV and Candida.

## 2020-08-15 NOTE — Assessment & Plan Note (Signed)
Concerned that this may be poorly treated with her current CPAP.  We will request a compliance report.

## 2020-08-15 NOTE — Assessment & Plan Note (Signed)
Concern for possible candidal intertrigo.  We will treat with nystatin.  Will refer back to GYN.

## 2020-08-16 LAB — CERVICOVAGINAL ANCILLARY ONLY
Bacterial Vaginitis (gardnerella): POSITIVE — AB
Candida Glabrata: NEGATIVE
Candida Vaginitis: NEGATIVE
Comment: NEGATIVE
Comment: NEGATIVE
Comment: NEGATIVE
Comment: NEGATIVE
Trichomonas: NEGATIVE

## 2020-08-18 ENCOUNTER — Telehealth: Payer: Self-pay

## 2020-08-18 NOTE — Telephone Encounter (Signed)
-----   Message from Leone Haven, MD sent at 08/16/2020  6:47 PM EST ----- Please let the patient know that her LDL is above goal. We will see how she does on the pravastatin. Her A1c is worse than previously. I would suggest trying to increase the dose of the metformin to see if we can get her A1c to less than 7. Her TSH is elevated. We need to check additional lab work for this to help determine if she has hypothyroidism.

## 2020-08-23 ENCOUNTER — Other Ambulatory Visit: Payer: Self-pay | Admitting: Family Medicine

## 2020-08-23 DIAGNOSIS — R7989 Other specified abnormal findings of blood chemistry: Secondary | ICD-10-CM

## 2020-08-23 MED ORDER — METFORMIN HCL ER 500 MG PO TB24
1000.0000 mg | ORAL_TABLET | Freq: Every day | ORAL | 1 refills | Status: DC
Start: 2020-08-23 — End: 2020-12-19

## 2020-08-25 ENCOUNTER — Other Ambulatory Visit: Payer: Self-pay

## 2020-08-25 ENCOUNTER — Encounter: Payer: Self-pay | Admitting: Obstetrics and Gynecology

## 2020-08-25 ENCOUNTER — Ambulatory Visit: Payer: Medicare Other | Admitting: Obstetrics and Gynecology

## 2020-08-25 VITALS — BP 133/74 | HR 89 | Ht 65.0 in | Wt 207.1 lb

## 2020-08-25 DIAGNOSIS — L900 Lichen sclerosus et atrophicus: Secondary | ICD-10-CM | POA: Diagnosis not present

## 2020-08-25 DIAGNOSIS — B9689 Other specified bacterial agents as the cause of diseases classified elsewhere: Secondary | ICD-10-CM

## 2020-08-25 DIAGNOSIS — N760 Acute vaginitis: Secondary | ICD-10-CM | POA: Diagnosis not present

## 2020-08-25 MED ORDER — CLOBETASOL PROPIONATE 0.05 % EX OINT: 1.0000 "application " | TOPICAL_OINTMENT | Freq: Two times a day (BID) | CUTANEOUS | 1 refills | Status: AC

## 2020-08-25 MED ORDER — METRONIDAZOLE 500 MG PO TABS: 500.0000 mg | ORAL_TABLET | Freq: Two times a day (BID) | ORAL | 0 refills | Status: AC

## 2020-08-25 NOTE — Progress Notes (Signed)
HPI:      [name] is a 74 y.o. No obstetric history on file. who LMP was No LMP recorded. Patient is postmenopausal.  Subjective:   She presents today complaining of vulvar/vaginal itching and irritation for more than a year.  She reports that she recently had cultures done but does not know the results.  She has tried topical nystatin without effect.  She reports this is especially bad at the front and back of her labia. Patient denies vaginal bleeding in the last year. Patient not sexually active. Of significant note patient is diabetic and her A1c is elevated.    Hx: The following portions of the patient's history were reviewed and updated as appropriate:             She  has a past medical history of Allergic rhinitis, Arthritis, Cataracts, bilateral, CHOLECYSTECTOMY, HX OF (10/08/2007), Chronic diastolic CHF (congestive heart failure) (HCC), Chronic respiratory failure (HCC), Diabetes mellitus without complication (HCC), Diverticulosis, Glucose intolerance (impaired glucose tolerance), Gout, History of chicken pox, Hyperlipidemia, Hypertension, NICM (nonischemic cardiomyopathy) (HCC) (2002), Obesity, Oxygen deficiency (2017), PONV (postoperative nausea and vomiting), Restrictive lung disease, Skin cancer, and Sleep apnea. She does not have any pertinent problems on file. She  has a past surgical history that includes nsvd; Cystectomy (1996); choleystectomy (02/2002); Vaginal delivery; Tubal ligation (1975); Dilation and curettage of uterus (2011); Induced abortion; Cardiac catheterization; Bilateral VATS ablation (Right); Colonoscopy with propofol (N/A, 12/20/2014); Breast excisional biopsy (Left, 80's); Cholecystectomy; Colonoscopy with propofol (N/A, 03/22/2020); polypectomy (03/22/2020); Cataract extraction w/PHACO (Right, 06/21/2020); and Cataract extraction w/PHACO (Left, 07/12/2020). Her family history includes Bladder Cancer in her maternal grandmother; COPD in her father;  Depression in her sister; Diabetes in her father, paternal grandfather, and sister; Heart attack in her father; Heart disease in her father and paternal grandfather; Hyperlipidemia in her father; Hypertension in her brother and father; Leukemia in her maternal grandfather; Lymphoma (age of onset: 8) in her mother; Meniere's disease in her brother; Pneumonia in her father; Schizophrenia in her daughter; Stroke in her father. She  reports that she has never smoked. She has never used smokeless tobacco. She reports that she does not drink alcohol and does not use drugs. She has a current medication list which includes the following prescription(s): aspirin, calcium-magnesium-vitamin d, carvedilol, clobetasol ointment, coenzyme q10, fish oil, losartan, metformin, metronidazole, multivitamin, nystatin cream, polyvinyl alcohol, pravastatin, and torsemide. She is allergic to amoxicillin, etodolac, hydrochlorothiazide, meloxicam, sulfa antibiotics, allopurinol, metformin and related, rosuvastatin, sulfonamide derivatives, jardiance [empagliflozin], and lasix [furosemide].       Review of Systems:  Review of Systems  Constitutional: Denied constitutional symptoms, night sweats, recent illness, fatigue, fever, insomnia and weight loss.  Eyes: Denied eye symptoms, eye pain, photophobia, vision change and visual disturbance.  Ears/Nose/Throat/Neck: Denied ear, nose, throat or neck symptoms, hearing loss, nasal discharge, sinus congestion and sore throat.  Cardiovascular: Denied cardiovascular symptoms, arrhythmia, chest pain/pressure, edema, exercise intolerance, orthopnea and palpitations.  Respiratory: Denied pulmonary symptoms, asthma, pleuritic pain, productive sputum, cough, dyspnea and wheezing.  Gastrointestinal: Denied, gastro-esophageal reflux, melena, nausea and vomiting.  Genitourinary: See HPI for additional information.  Musculoskeletal: Denied musculoskeletal symptoms, stiffness, swelling, muscle  weakness and myalgia.  Dermatologic: Denied dermatology symptoms, rash and scar.  Neurologic: Denied neurology symptoms, dizziness, headache, neck pain and syncope.  Psychiatric: Denied psychiatric symptoms, anxiety and depression.  Endocrine: Denied endocrine symptoms including hot flashes and night sweats.   Meds:   Current Outpatient Medications on File Prior  to Visit  Medication Sig Dispense Refill  . aspirin 81 MG EC tablet Take 1 tablet (81 mg total) by mouth daily. 90 tablet 2  . CALCIUM-MAGNESIUM-VITAMIN D PO Take 1 tablet by mouth daily.    . carvedilol (COREG) 12.5 MG tablet TAKE 1 TABLET BY MOUTH TWICE A DAY WITH MEALS (Patient taking differently: Take 12.5 mg by mouth 2 (two) times daily with a meal. TAKE 1 TABLET BY MOUTH TWICE A DAY WITH MEALS) 180 tablet 3  . Coenzyme Q10 (CO Q 10 PO) Take 200 mg by mouth at bedtime.     . Fish Oil OIL Take 1,200 mg by mouth 2 (two) times daily. GUMMIES    . losartan (COZAAR) 50 MG tablet Take 0.5 tablets (25 mg total) by mouth daily. 45 tablet 3  . metFORMIN (GLUCOPHAGE-XR) 500 MG 24 hr tablet Take 2 tablets (1,000 mg total) by mouth daily with breakfast. 180 tablet 1  . multivitamin (THERAGRAN) per tablet Take 1 tablet by mouth daily.    Marland Kitchen nystatin cream (MYCOSTATIN) Apply 1 application topically 2 (two) times daily. 30 g 0  . polyvinyl alcohol (LIQUIFILM TEARS) 1.4 % ophthalmic solution Place 1 drop into both eyes every morning.    . pravastatin (PRAVACHOL) 20 MG tablet Take 1 tablet (20 mg total) by mouth once a week. 12 tablet 3  . torsemide (DEMADEX) 10 MG tablet Take 1 tablet (10 mg total) by mouth daily. 90 tablet 3   No current facility-administered medications on file prior to visit.          Objective:     Vitals:   08/25/20 1000  BP: 133/74  Pulse: 89   Filed Weights   08/25/20 1000  Weight: 207 lb 1.6 oz (93.9 kg)              Physical examination   Pelvic:   Vulva:  Labial fusion noted anteriorly.  Irritation  posteriorly and perirectal noted.  Minimal erythema.  Vagina: No lesions or abnormalities noted.  Support: Normal pelvic support.  Urethra No masses tenderness or scarring.  Meatus Normal size without lesions or prolapse.  Anus: Normal exam.  No lesions.  Perineum: Normal exam.  No lesions.   Cultures noted showing BV. (Patient has not yet been treated)   Assessment:    No obstetric history on file. Patient Active Problem List   Diagnosis Date Noted  . Candidal intertrigo 08/15/2020  . Vaginal discharge 08/15/2020  . Fall 04/20/2020  . Benign neoplasm of sigmoid colon   . Pulmonary HTN ([place]) 10/27/2019  . Elevated LDL cholesterol level 10/27/2019  . Cervical radiculopathy 10/14/2019  . Flatulence 04/10/2019  . Restrictive lung disease 08/06/2016  . Left-sided low back pain with left-sided sciatica 05/01/2016  . History of colonic polyps   . Benign neoplasm of ascending colon   . Benign neoplasm of transverse colon   . Rash and nonspecific skin eruption 08/30/2014  . Postmenopausal estrogen deficiency 08/30/2014  . Left Achilles tendinitis 04/23/2014  . Chronic diastolic heart failure ([place]) 04/08/2014  . Obesity (BMI 30-39.9) 04/09/2013  . Diabetes mellitus type 2, controlled ([place]) 01/07/2013  . Edema 01/07/2013  . Chronic hypoxemic respiratory failure ([place]) 11/11/2012  . Obstructive sleep apnea 11/11/2012  . DOE (dyspnea on exertion) 08/18/2012  . Fatigue 08/18/2012  . Meralgia paresthetica of left side 08/20/2011  . Myalgia 05/03/2011  . Leg pain, lateral 04/12/2011  . GOUT 12/26/2009  . CARDIOMYOPATHY 10/08/2007  . ALLERGIC RHINITIS, SEASONAL 10/08/2007  .  HYPERLIPIDEMIA, MIXED 05/02/2007     1. Lichen sclerosus   2. Bacterial vulvovaginitis     I believe that the lichen sclerosus is the source of her irritation.  It has caused labial fusion as well.  Recent cultures reveal BV.  Patient has tried topical nystatin without success.  Her irritation could be  chronic yeast based on her diabetes and her symptoms however with negative cultures and failure of nystatin this is doubtful.   Plan:            1.  Discussed lichen sclerosus in detail.  Prescribed clobetasol.  2.  Treat patient with Flagyl for BV    Orders No orders of the defined types were placed in this encounter.    Meds ordered this encounter  Medications  . metroNIDAZOLE (FLAGYL) 500 MG tablet    Sig: Take 1 tablet (500 mg total) by mouth 2 (two) times daily for 7 days.    Dispense:  14 tablet    Refill:  0  . clobetasol ointment (TEMOVATE) 0.05 %    Sig: Apply 1 application topically 2 (two) times daily.    Dispense:  45 g    Refill:  1      F/U  No follow-ups on file. I spent 24 minutes involved in the care of this patient preparing to see the patient by obtaining and reviewing her medical history (including labs, imaging tests and prior procedures), documenting clinical information in the electronic health record (EHR), counseling and coordinating care plans, writing and sending prescriptions, ordering tests or procedures and directly communicating with the patient by discussing pertinent items from her history and physical exam as well as detailing my assessment and plan as noted above so that she has an informed understanding.  All of her questions were answered.  Finis Bud, M.D. 08/25/2020 10:25 AM

## 2020-09-03 DIAGNOSIS — J9610 Chronic respiratory failure, unspecified whether with hypoxia or hypercapnia: Secondary | ICD-10-CM | POA: Diagnosis not present

## 2020-09-05 ENCOUNTER — Encounter: Payer: Self-pay | Admitting: Family Medicine

## 2020-09-22 ENCOUNTER — Ambulatory Visit: Payer: Medicare PPO | Admitting: Obstetrics and Gynecology

## 2020-09-22 ENCOUNTER — Other Ambulatory Visit: Payer: Self-pay

## 2020-09-22 ENCOUNTER — Encounter: Payer: Self-pay | Admitting: Obstetrics and Gynecology

## 2020-09-22 VITALS — BP 127/80 | HR 93 | Ht 65.0 in | Wt 204.4 lb

## 2020-09-22 DIAGNOSIS — L900 Lichen sclerosus et atrophicus: Secondary | ICD-10-CM

## 2020-09-22 NOTE — Progress Notes (Signed)
HPI:      [name] is a 75 y.o. No obstetric history on file. who LMP was No LMP recorded. Patient is postmenopausal.  Subjective:   She presents today for follow-up of lichen sclerosus.  She reports that her itching is resolved.  She feels "much better".  She is using the clobetasol 1-2 times per day.    Hx: The following portions of the patient's history were reviewed and updated as appropriate:             She  has a past medical history of Allergic rhinitis, Arthritis, Cataracts, bilateral, CHOLECYSTECTOMY, HX OF (10/08/2007), Chronic diastolic CHF (congestive heart failure) (HCC), Chronic respiratory failure (HCC), Diabetes mellitus without complication (HCC), Diverticulosis, Glucose intolerance (impaired glucose tolerance), Gout, History of chicken pox, Hyperlipidemia, Hypertension, NICM (nonischemic cardiomyopathy) (HCC) (2002), Obesity, Oxygen deficiency (2017), PONV (postoperative nausea and vomiting), Restrictive lung disease, Skin cancer, and Sleep apnea. She does not have any pertinent problems on file. She  has a past surgical history that includes nsvd; Cystectomy (1996); choleystectomy (02/2002); Vaginal delivery; Tubal ligation (1975); Dilation and curettage of uterus (2011); Induced abortion; Cardiac catheterization; Bilateral VATS ablation (Right); Colonoscopy with propofol (N/A, 12/20/2014); Breast excisional biopsy (Left, 80's); Cholecystectomy; Colonoscopy with propofol (N/A, 03/22/2020); polypectomy (03/22/2020); Cataract extraction w/PHACO (Right, 06/21/2020); and Cataract extraction w/PHACO (Left, 07/12/2020). Her family history includes Bladder Cancer in her maternal grandmother; COPD in her father; Depression in her sister; Diabetes in her father, paternal grandfather, and sister; Heart attack in her father; Heart disease in her father and paternal grandfather; Hyperlipidemia in her father; Hypertension in her brother and father; Leukemia in her maternal grandfather;  Lymphoma (age of onset: 34) in her mother; Meniere's disease in her brother; Pneumonia in her father; Schizophrenia in her daughter; Stroke in her father. She  reports that she has never smoked. She has never used smokeless tobacco. She reports that she does not drink alcohol and does not use drugs. She has a current medication list which includes the following prescription(s): aspirin, calcium-magnesium-vitamin d, carvedilol, clobetasol ointment, coenzyme q10, fish oil, losartan, metformin, multivitamin, nystatin cream, polyvinyl alcohol, pravastatin, and torsemide. She is allergic to amoxicillin, etodolac, hydrochlorothiazide, meloxicam, sulfa antibiotics, allopurinol, metformin and related, rosuvastatin, sulfonamide derivatives, jardiance [empagliflozin], and lasix [furosemide].       Review of Systems:  Review of Systems  Constitutional: Denied constitutional symptoms, night sweats, recent illness, fatigue, fever, insomnia and weight loss.  Eyes: Denied eye symptoms, eye pain, photophobia, vision change and visual disturbance.  Ears/Nose/Throat/Neck: Denied ear, nose, throat or neck symptoms, hearing loss, nasal discharge, sinus congestion and sore throat.  Cardiovascular: Denied cardiovascular symptoms, arrhythmia, chest pain/pressure, edema, exercise intolerance, orthopnea and palpitations.  Respiratory: Denied pulmonary symptoms, asthma, pleuritic pain, productive sputum, cough, dyspnea and wheezing.  Gastrointestinal: Denied, gastro-esophageal reflux, melena, nausea and vomiting.  Genitourinary: Denied genitourinary symptoms including symptomatic vaginal discharge, pelvic relaxation issues, and urinary complaints.  Musculoskeletal: Denied musculoskeletal symptoms, stiffness, swelling, muscle weakness and myalgia.  Dermatologic: Denied dermatology symptoms, rash and scar.  Neurologic: Denied neurology symptoms, dizziness, headache, neck pain and syncope.  Psychiatric: Denied psychiatric  symptoms, anxiety and depression.  Endocrine: Denied endocrine symptoms including hot flashes and night sweats.   Meds:   Current Outpatient Medications on File Prior to Visit  Medication Sig Dispense Refill   aspirin 81 MG EC tablet Take 1 tablet (81 mg total) by mouth daily. 90 tablet 2   CALCIUM-MAGNESIUM-VITAMIN D PO Take 1 tablet by mouth daily.  carvedilol (COREG) 12.5 MG tablet TAKE 1 TABLET BY MOUTH TWICE A DAY WITH MEALS (Patient taking differently: Take 12.5 mg by mouth 2 (two) times daily with a meal. TAKE 1 TABLET BY MOUTH TWICE A DAY WITH MEALS) 180 tablet 3   clobetasol ointment (TEMOVATE) 0.62 % Apply 1 application topically 2 (two) times daily. 45 g 1   Coenzyme Q10 (CO Q 10 PO) Take 200 mg by mouth at bedtime.      Fish Oil OIL Take 1,200 mg by mouth 2 (two) times daily. GUMMIES     losartan (COZAAR) 50 MG tablet Take 0.5 tablets (25 mg total) by mouth daily. 45 tablet 3   metFORMIN (GLUCOPHAGE-XR) 500 MG 24 hr tablet Take 2 tablets (1,000 mg total) by mouth daily with breakfast. 180 tablet 1   multivitamin (THERAGRAN) per tablet Take 1 tablet by mouth daily.     nystatin cream (MYCOSTATIN) Apply 1 application topically 2 (two) times daily. 30 g 0   polyvinyl alcohol (LIQUIFILM TEARS) 1.4 % ophthalmic solution Place 1 drop into both eyes every morning.     pravastatin (PRAVACHOL) 20 MG tablet Take 1 tablet (20 mg total) by mouth once a week. 12 tablet 3   torsemide (DEMADEX) 10 MG tablet Take 1 tablet (10 mg total) by mouth daily. 90 tablet 3   No current facility-administered medications on file prior to visit.          Objective:     Vitals:   09/22/20 1054  BP: 127/80  Pulse: 93   Filed Weights   09/22/20 1054  Weight: 204 lb 6.4 oz (92.7 kg)              Physical examination   Pelvic:   Vulva: Normal appearance.  No lesions.  Some erythema but decreased in leukoplakia.  Very small area of anterior labial fusion noted  Vagina: No lesions  or abnormalities noted.  Support: Normal pelvic support.  Urethra No masses tenderness or scarring.  Meatus Normal size without lesions or prolapse.  Cervix: Normal appearance.  No lesions.  Anus: Normal exam.  No lesions.  Perineum: Normal exam.  No lesions.     Assessment:    No obstetric history on file. Patient Active Problem List   Diagnosis Date Noted   Candidal intertrigo 08/15/2020   Vaginal discharge 08/15/2020   Fall 04/20/2020   Benign neoplasm of sigmoid colon    Pulmonary HTN ([place]) 10/27/2019   Elevated LDL cholesterol level 10/27/2019   Cervical radiculopathy 10/14/2019   Flatulence 04/10/2019   Restrictive lung disease 08/06/2016   Left-sided low back pain with left-sided sciatica 05/01/2016   History of colonic polyps    Benign neoplasm of ascending colon    Benign neoplasm of transverse colon    Rash and nonspecific skin eruption 08/30/2014   Postmenopausal estrogen deficiency 08/30/2014   Left Achilles tendinitis 04/23/2014   Chronic diastolic heart failure ([place]) 04/08/2014   Obesity (BMI 30-39.9) 04/09/2013   Diabetes mellitus type 2, controlled ([place]) 01/07/2013   Edema 01/07/2013   Chronic hypoxemic respiratory failure ([place]) 11/11/2012   Obstructive sleep apnea 11/11/2012   DOE (dyspnea on exertion) 08/18/2012   Fatigue 08/18/2012   Meralgia paresthetica of left side 08/20/2011   Myalgia 05/03/2011   Leg pain, lateral 04/12/2011   GOUT 12/26/2009   CARDIOMYOPATHY 10/08/2007   ALLERGIC RHINITIS, SEASONAL 10/08/2007   HYPERLIPIDEMIA, MIXED 05/02/2007     1. Lichen sclerosus     Much improved with use  of clobetasol.  Patient now asymptomatic.   Plan:            1.  Recommend clobetasol twice weekly for maintenance.  Specifically recommend anterior labia. Orders No orders of the defined types were placed in this encounter.   No orders of the defined types were placed in this encounter.     F/U  Return in  about 6 months (around 03/22/2021). I spent 20 minutes involved in the care of this patient preparing to see the patient by obtaining and reviewing her medical history (including labs, imaging tests and prior procedures), documenting clinical information in the electronic health record (EHR), counseling and coordinating care plans, writing and sending prescriptions, ordering tests or procedures and directly communicating with the patient by discussing pertinent items from her history and physical exam as well as detailing my assessment and plan as noted above so that she has an informed understanding.  All of her questions were answered.  Finis Bud, M.D. 09/22/2020 11:12 AM

## 2020-09-26 ENCOUNTER — Other Ambulatory Visit (INDEPENDENT_AMBULATORY_CARE_PROVIDER_SITE_OTHER): Payer: Medicare PPO

## 2020-09-26 ENCOUNTER — Other Ambulatory Visit: Payer: Self-pay

## 2020-09-26 DIAGNOSIS — E782 Mixed hyperlipidemia: Secondary | ICD-10-CM

## 2020-09-26 DIAGNOSIS — R7989 Other specified abnormal findings of blood chemistry: Secondary | ICD-10-CM | POA: Diagnosis not present

## 2020-09-26 LAB — HEPATIC FUNCTION PANEL
ALT: 17 U/L (ref 0–35)
AST: 17 U/L (ref 0–37)
Albumin: 4 g/dL (ref 3.5–5.2)
Alkaline Phosphatase: 69 U/L (ref 39–117)
Bilirubin, Direct: 0.1 mg/dL (ref 0.0–0.3)
Total Bilirubin: 0.5 mg/dL (ref 0.2–1.2)
Total Protein: 7.2 g/dL (ref 6.0–8.3)

## 2020-09-26 LAB — LDL CHOLESTEROL, DIRECT: Direct LDL: 106 mg/dL

## 2020-09-26 LAB — T4, FREE: Free T4: 0.86 ng/dL (ref 0.60–1.60)

## 2020-09-29 DIAGNOSIS — G4733 Obstructive sleep apnea (adult) (pediatric): Secondary | ICD-10-CM | POA: Diagnosis not present

## 2020-10-04 ENCOUNTER — Other Ambulatory Visit: Payer: Self-pay | Admitting: Family Medicine

## 2020-10-04 DIAGNOSIS — J9610 Chronic respiratory failure, unspecified whether with hypoxia or hypercapnia: Secondary | ICD-10-CM | POA: Diagnosis not present

## 2020-11-01 DIAGNOSIS — J9610 Chronic respiratory failure, unspecified whether with hypoxia or hypercapnia: Secondary | ICD-10-CM | POA: Diagnosis not present

## 2020-11-08 NOTE — ED Provider Notes (Incomplete)
I provided a substantive portion of the care of this patient.  I personally performed the entirety of the medical decision making for this encounter.

## 2020-11-10 ENCOUNTER — Other Ambulatory Visit: Payer: Self-pay

## 2020-11-14 ENCOUNTER — Encounter: Payer: Self-pay | Admitting: Family Medicine

## 2020-11-14 ENCOUNTER — Ambulatory Visit: Payer: Medicare PPO | Admitting: Family Medicine

## 2020-11-14 ENCOUNTER — Other Ambulatory Visit: Payer: Self-pay

## 2020-11-14 DIAGNOSIS — J9611 Chronic respiratory failure with hypoxia: Secondary | ICD-10-CM

## 2020-11-14 DIAGNOSIS — E7800 Pure hypercholesterolemia, unspecified: Secondary | ICD-10-CM | POA: Diagnosis not present

## 2020-11-14 DIAGNOSIS — I272 Pulmonary hypertension, unspecified: Secondary | ICD-10-CM

## 2020-11-14 DIAGNOSIS — E1121 Type 2 diabetes mellitus with diabetic nephropathy: Secondary | ICD-10-CM

## 2020-11-14 DIAGNOSIS — L299 Pruritus, unspecified: Secondary | ICD-10-CM | POA: Diagnosis not present

## 2020-11-14 DIAGNOSIS — M10079 Idiopathic gout, unspecified ankle and foot: Secondary | ICD-10-CM

## 2020-11-14 LAB — LDL CHOLESTEROL, DIRECT: Direct LDL: 114 mg/dL

## 2020-11-14 LAB — HEMOGLOBIN A1C: Hgb A1c MFr Bld: 6.9 % — ABNORMAL HIGH (ref 4.6–6.5)

## 2020-11-14 LAB — HEPATIC FUNCTION PANEL
ALT: 17 U/L (ref 0–35)
AST: 18 U/L (ref 0–37)
Albumin: 4.2 g/dL (ref 3.5–5.2)
Alkaline Phosphatase: 76 U/L (ref 39–117)
Bilirubin, Direct: 0.1 mg/dL (ref 0.0–0.3)
Total Bilirubin: 0.5 mg/dL (ref 0.2–1.2)
Total Protein: 7.3 g/dL (ref 6.0–8.3)

## 2020-11-14 LAB — URIC ACID: Uric Acid, Serum: 7.1 mg/dL — ABNORMAL HIGH (ref 2.4–7.0)

## 2020-11-14 MED ORDER — ACETIC ACID 2 % OT SOLN: OTIC | 0 refills | Status: DC

## 2020-11-14 NOTE — Progress Notes (Signed)
Betty Rumps, MD Phone: [phone]  Betty [name] is a 75 y.o. female who presents today for f/u.  DIABETES Disease Monitoring: Blood Sugar ranges-not checking Polyuria/phagia/dipsia- no      Optho- UTD Medications: Compliance- taking metformin XR 1000 mg daily Hypoglycemic symptoms- no  HYPERLIPIDEMIA Symptoms Chest pain on exertion:  no   Leg claudication:   no Medications: Compliance- taking prvastatin 20 mg once weekly Right upper quadrant pain- no  Muscle aches- no  Left ear itching: This has been going on for a few months.  Itches on the inside of her ears.  She is not tried any medicines for this.  She does clean her ears with Q-tips.  No tinnitus.  Mild decrease in hearing.  Occasional ear fullness.  Some popping in her ears.  Chronic respiratory failure: Patient uses oxygen at night and occasionally uses it during the day.  Has occasional shortness of breath.  Typically her oxygenation is greater than 90% per her report.  Gout: Patient noted a prior attack of gout with podagra.  She thinks it may have been related to her being on Allegra with the pravastatin.  She typically only has 1-2 gout attacks per year.  She took her husband's Mitigare with good benefit.   Social History   Tobacco Use  Smoking Status Never Smoker  Smokeless Tobacco Never Used    Current Outpatient Medications on File Prior to Visit  Medication Sig Dispense Refill  . aspirin 81 MG EC tablet Take 1 tablet (81 mg total) by mouth daily. 90 tablet 2  . CALCIUM-MAGNESIUM-VITAMIN D PO Take 1 tablet by mouth daily.    . carvedilol (COREG) 12.5 MG tablet TAKE 1 TABLET BY MOUTH TWICE A DAY WITH MEALS (Patient taking differently: Take 12.5 mg by mouth 2 (two) times daily with a meal. TAKE 1 TABLET BY MOUTH TWICE A DAY WITH MEALS) 180 tablet 3  . Coenzyme Q10 (CO Q 10 PO) Take 200 mg by mouth at bedtime.     . Fish Oil OIL Take 1,200 mg by mouth 2 (two) times daily. GUMMIES    . losartan (COZAAR) 50  MG tablet TAKE 1/2 TABLET BY MOUTH DAILY 45 tablet 3  . metFORMIN (GLUCOPHAGE-XR) 500 MG 24 hr tablet Take 2 tablets (1,000 mg total) by mouth daily with breakfast. 180 tablet 1  . multivitamin (THERAGRAN) per tablet Take 1 tablet by mouth daily.    Marland Kitchen nystatin cream (MYCOSTATIN) Apply 1 application topically 2 (two) times daily. 30 g 0  . polyvinyl alcohol (LIQUIFILM TEARS) 1.4 % ophthalmic solution Place 1 drop into both eyes every morning.    . pravastatin (PRAVACHOL) 20 MG tablet Take 1 tablet (20 mg total) by mouth once a week. 12 tablet 3  . torsemide (DEMADEX) 10 MG tablet Take 1 tablet (10 mg total) by mouth daily. 90 tablet 3   No current facility-administered medications on file prior to visit.     ROS see history of present illness  Objective  Physical Exam Vitals:   11/14/20 1005  BP: 118/70  Pulse: 85  Temp: (!) 97.5 F (36.4 C)  SpO2: 90%    BP Readings from Last 3 Encounters:  11/14/20 118/70  09/22/20 127/80  08/25/20 133/74   Wt Readings from Last 3 Encounters:  11/14/20 203 lb 6.4 oz (92.3 kg)  09/22/20 204 lb 6.4 oz (92.7 kg)  08/25/20 207 lb 1.6 oz (93.9 kg)    Physical Exam Constitutional:      General:  She is not in acute distress.    Appearance: She is not diaphoretic.  HENT:     Right Ear: Tympanic membrane and ear canal normal.     Left Ear: Tympanic membrane and ear canal normal.  Cardiovascular:     Rate and Rhythm: Normal rate and regular rhythm.     Heart sounds: Normal heart sounds.  Pulmonary:     Effort: Pulmonary effort is normal.     Breath sounds: Normal breath sounds.  Musculoskeletal:     Right lower leg: No edema.     Left lower leg: No edema.  Skin:    General: Skin is warm and dry.  Neurological:     Mental Status: She is alert.      Assessment/Plan: Please see individual problem list.  Problem List Items Addressed This Visit    Chronic hypoxemic respiratory failure ([place])    I encouraged the patient to wear her  oxygen consistently.      Diabetes mellitus type 2, controlled ([place]) (Chronic)    Check A1c.  Continue Metformin 1000 mg once daily.      Relevant Orders   HgB A1c   Ear itching    Benign exam though the patient could possibly have mild otitis externa.  Will trial acetic acid eardrops.  If no benefit she will let us know.      Relevant Medications   acetic acid 2 % otic solution   Elevated LDL cholesterol level    Check LDL and hepatic function panel.  She will continue pravastatin once weekly for now though we may increase this depending on what her LDL is.      Relevant Orders   Direct LDL   Hepatic function panel   GOUT    Patient with rare flares.  We will check a uric acid.  Evidently there is a possible interaction between carvedilol and colchicine.  I sent a message to our clinical pharmacist to clarify this.      Relevant Orders   Uric acid   Pulmonary HTN ([place])    I recommended that she wear her oxygen consistently.  Discussed that without appropriately wearing her oxygen her pulmonary hypertension and breathing could worsen.          This visit occurred during the SARS-CoV-2 public health emergency.  Safety protocols were in place, including screening questions prior to the visit, additional usage of staff PPE, and extensive cleaning of exam room while observing appropriate contact time as indicated for disinfecting solutions.    Betty Rumps, MD [place]

## 2020-11-14 NOTE — Assessment & Plan Note (Signed)
I recommended that she wear her oxygen consistently.  Discussed that without appropriately wearing her oxygen her pulmonary hypertension and breathing could worsen.

## 2020-11-14 NOTE — Patient Instructions (Signed)
Nice to see you. We will get labs today. I will check with our clinical pharmacist regarding the carvedilol/colchicine interaction.

## 2020-11-14 NOTE — Assessment & Plan Note (Signed)
Check LDL and hepatic function panel.  She will continue pravastatin once weekly for now though we may increase this depending on what her LDL is.

## 2020-11-14 NOTE — Assessment & Plan Note (Signed)
Check A1c.  Continue Metformin 1000 mg once daily.

## 2020-11-14 NOTE — Assessment & Plan Note (Signed)
Benign exam though the patient could possibly have mild otitis externa.  Will trial acetic acid eardrops.  If no benefit she will let us know.

## 2020-11-14 NOTE — Assessment & Plan Note (Signed)
I encouraged the patient to wear her oxygen consistently.

## 2020-11-14 NOTE — Assessment & Plan Note (Addendum)
Patient with rare flares.  We will check a uric acid.  Evidently there is a possible interaction between carvedilol and colchicine.  I sent a message to our clinical pharmacist to clarify this.

## 2020-11-15 ENCOUNTER — Telehealth: Payer: Self-pay | Admitting: Family Medicine

## 2020-11-15 ENCOUNTER — Other Ambulatory Visit: Payer: Self-pay | Admitting: Family Medicine

## 2020-11-15 DIAGNOSIS — E782 Mixed hyperlipidemia: Secondary | ICD-10-CM

## 2020-11-15 MED ORDER — PRAVASTATIN SODIUM 20 MG PO TABS: ORAL_TABLET | ORAL | 3 refills | Status: DC

## 2020-11-15 NOTE — Telephone Encounter (Signed)
I called and spoke with the patient and explained what the provider and pharmacist has discussed.   Patient is okay with you sending in the medication.  Nina,cma

## 2020-11-15 NOTE — Telephone Encounter (Signed)
Please let the patient know I heard back from the clinical pharmacist.  They noted they had not found any great evidence that there is a significant interaction between colchicine at low doses used only during a gout flare and with carvedilol.  I think it would be okay for [place] to use colchicine for her to only take when she is having a gout flare.  She would have to let us know right away if she developed bad GI upset or abdominal pain as that would indicate that her colchicine levels are too high.  I can send this in once you speak with her.

## 2020-11-15 NOTE — Telephone Encounter (Signed)
-----   Message from De Hollingshead, [place] sent at 11/14/2020 10:41 AM EDT ----- Marykay Lex,   It does flag as potential, but I have never found evidence of this being clinically relevant, especially given the lower colchicine flare dosing we use these days.   Obviously just have her let us know if REALLY bad GI upset/diarrhea with colchicine use that would indicate supratherapeutic concentrations.   Catie ----- Message ----- From: Leone Haven, MD Sent: 11/14/2020  10:31 AM EDT To: De Hollingshead, RPH-CPP  Hey Catie,   I tried to prescribe colchicine for this patient and there was a warning regarding an interaction with colchicine and carvedilol.  Noted that the carvedilol could potentially increase the concentration of colchicine.  I wanted to get your thoughts on this and see if this needs to be dose adjusted or if you would recommend using an alternative medicine for a gout flare.  Thanks.  Randall Hiss

## 2020-11-16 ENCOUNTER — Other Ambulatory Visit: Payer: Self-pay | Admitting: Family Medicine

## 2020-11-16 MED ORDER — COLCHICINE 0.6 MG PO TABS: ORAL_TABLET | ORAL | 0 refills | Status: DC

## 2020-11-16 NOTE — Telephone Encounter (Signed)
Colchicine sent to pharmacy.

## 2020-11-16 NOTE — Addendum Note (Signed)
Addended by: Leone Haven on: 11/16/2020 04:49 PM   Modules accepted: Orders

## 2020-11-17 NOTE — Telephone Encounter (Signed)
Alternative sent to pharmacy.

## 2020-12-02 DIAGNOSIS — J9610 Chronic respiratory failure, unspecified whether with hypoxia or hypercapnia: Secondary | ICD-10-CM | POA: Diagnosis not present

## 2020-12-13 DIAGNOSIS — I872 Venous insufficiency (chronic) (peripheral): Secondary | ICD-10-CM | POA: Diagnosis not present

## 2020-12-13 DIAGNOSIS — L570 Actinic keratosis: Secondary | ICD-10-CM | POA: Diagnosis not present

## 2020-12-17 ENCOUNTER — Other Ambulatory Visit: Payer: Self-pay | Admitting: Family Medicine

## 2020-12-17 ENCOUNTER — Other Ambulatory Visit (HOSPITAL_COMMUNITY): Payer: Self-pay | Admitting: Cardiology

## 2020-12-22 ENCOUNTER — Ambulatory Visit: Payer: Medicare PPO | Admitting: Cardiology

## 2020-12-22 ENCOUNTER — Encounter: Payer: Self-pay | Admitting: Cardiology

## 2020-12-22 ENCOUNTER — Other Ambulatory Visit: Payer: Self-pay

## 2020-12-22 VITALS — BP 130/64 | HR 94 | Ht 65.0 in | Wt 202.2 lb

## 2020-12-22 DIAGNOSIS — I10 Essential (primary) hypertension: Secondary | ICD-10-CM

## 2020-12-22 DIAGNOSIS — I5032 Chronic diastolic (congestive) heart failure: Secondary | ICD-10-CM | POA: Diagnosis not present

## 2020-12-22 DIAGNOSIS — E1121 Type 2 diabetes mellitus with diabetic nephropathy: Secondary | ICD-10-CM

## 2020-12-22 DIAGNOSIS — R0602 Shortness of breath: Secondary | ICD-10-CM | POA: Diagnosis not present

## 2020-12-22 NOTE — Progress Notes (Signed)
Cardiology Office Note:    Date:  12/22/2020   ID:  Betty, [name] May 04, 1946, MRN [id number]  PCP:  Leone Haven, MD  Cardiologist:  Fransico Him, MD    Referring MD: Leone Haven, MD   Chief Complaint  Patient presents with  . Congestive Heart Failure  . Shortness of Breath  . Hypertension    History of Present Illness:    Betty [name] is a 75 y.o. female with a hx of NICM with previous EF 25% (improved to normal), HL, gout, glucose intol, normal cors by [facility] in 2003, OSA.  She also had chronic shortness of breath with PFT showing restrictive lung disease with markedly depressed DLCO at 35% predicted.  CT scan showed upper lobe groundglass abnormalities versus air trapping.  Home sleep study showed mild obstructive sleep apnea with an AHI of 12 and oxygen desaturations to 80% independent of apneas.  Been seen by pulmonary with an extensive work-up.  Blood work including an HP panel and serologies for connective tissue diseases have all been normal.  2 echoes have showed no evidence of pulmonary hypertension.  After starting CPAP she was feeling better.  An open lung biopsy at [facility] in 2014 showed small amounts of fibrosis and pulmonary arteries as well as very mild bronchiolitis  RHC with exercise in 3/15 showed marked increase in pulmonary pressures and PCWP with exercise with no change in PVR. O2 sats down to 77% with exercise. Corrected with O2. PA rest 36/20 (26) PCWP 15 with exercise PA 78/40 (56) PCWP 28 This was felt to reflect diastolic dysfunction and intrinsic lung disease. Lung bx was not suggestive of clear lung pathology. CT was neg for PE. Continued management of diastolic CHFwas been recommended.   Studies: - RHC (5/14): RA 9, RV 32/8, PA 30/16/23, PCWP 12, 2.1 WU, CO 5.3, CI 2.7 - Echo (3/14): EF 55-60%, no RWMA, Gr 1 DD, mild MR - Nuclear (11/13): Breast atten, no ischemia, EF 55%; Low Risk  - PFTs. [place] 2014: FVC 1.46 L (48% pred),  TLC 2.77 L (55% pred) ERV 0.03 (3% pred), DLCO 8.4 (35% pred) - PFTs FVC 1.74 (55%), TLC 3.19 (61%), DLCO 15.89 (62%)   She is on home O2 with CPAP and had been followed by Dr. Lake Bells.   She has been very sedentary during the [place] pandemic and when I last saw her she was having more problems with SOB and LE edema but mainly was felt to be related to deconditioning and sedentary state as well as dietary indiscretion with Na.  Her last appt with Pulmonary in March 2021 at which time noted ambulatory O2 desats to 87% on RA and it was recommended that she stay on 3L pulsed O2 on exertion to keep O2 sast > 88-90%.  She has chronic LE edema which had improved and she stopped taking Lasix because it was drying her out.  She is now on Torsemide 10mg  daily.  She has not been compliant with using her O2 when she is out and when she is exerting herself.    She is here today for followup and is doing well.  She has chronic DOE that seems to be stable on O2 at 3L.  She was weaned herself off O2 during the day and only uses it if she goes out on a day it is very humid or hot.  Her chronic LE edema is stable with mainly LLE edema.   She denies any chest pain  or pressure,  PND, orthopnea,  dizziness, palpitations or syncope. She is compliant with her meds and is tolerating meds with no SE.    Past Medical History:  Diagnosis Date  . Allergic rhinitis    never tested, fall and spring  . Arthritis    hands,knees, feet  . Cataracts, bilateral    not surgical yet  . CHOLECYSTECTOMY, HX OF 10/08/2007   Qualifier: Diagnosis of  By: Hetty Ely MD, Franne Grip   . Chronic diastolic CHF (congestive heart failure) (HCC)   . Chronic respiratory failure (HCC)   . Diabetes mellitus without complication (HCC)   . Diverticulosis    problems with frequent gas, burping  . Glucose intolerance (impaired glucose tolerance)   . Gout   . History of chicken pox   . Hyperlipidemia   . Hypertension   . NICM (nonischemic  cardiomyopathy) (HCC) 2002   EF 25%; improved to normal - echo 4/08: EF 50-55%, mild MR, mild LAE, mild TR;    cath 3/03: normal cors, EF 40%  . Obesity   . Oxygen deficiency 2017   at night  . PONV (postoperative nausea and vomiting)    after wisdom tooth extraction  . Restrictive lung disease   . Skin cancer    skin cancers only  . Sleep apnea    cpap settings 4    Past Surgical History:  Procedure Laterality Date  . BILATERAL VATS ABLATION Right    biopsy done 2015- [facility]  . BREAST EXCISIONAL BIOPSY Left 80's   NEG  . CARDIAC CATHETERIZATION    . CATARACT EXTRACTION W/PHACO Right 06/21/2020   Procedure: CATARACT EXTRACTION PHACO AND INTRAOCULAR LENS PLACEMENT (IOC) RIGHT DIABETIC;  Surgeon: Galen Manila, MD;  Location: [facility];  Service: Ophthalmology;  Laterality: Right;  4.06 0:32.8  . CATARACT EXTRACTION W/PHACO Left 07/12/2020   Procedure: CATARACT EXTRACTION PHACO AND INTRAOCULAR LENS PLACEMENT (IOC) LEFT DIABETIC 6.96 00:46.3;  Surgeon: Galen Manila, MD;  Location: [facility];  Service: Ophthalmology;  Laterality: Left;  Diabetic - oral meds  . CHOLECYSTECTOMY    . choleystectomy  02/2002  . COLONOSCOPY WITH PROPOFOL N/A 12/20/2014   Procedure: COLONOSCOPY WITH PROPOFOL;  Surgeon: Beverley Fiedler, MD;  Location: [facility];  Service: Gastroenterology;  Laterality: N/A;  . COLONOSCOPY WITH PROPOFOL N/A 03/22/2020   Procedure: COLONOSCOPY WITH PROPOFOL;  Surgeon: Beverley Fiedler, MD;  Location: [facility];  Service: Gastroenterology;  Laterality: N/A;  . CYSTECTOMY  1996   l breast  . DILATION AND CURETTAGE OF UTERUS  2011   Dr. Marice Potter at [facility]  . INDUCED ABORTION    . nsvd     x 2  . POLYPECTOMY  03/22/2020   Procedure: POLYPECTOMY;  Surgeon: Beverley Fiedler, MD;  Location: [facility];  Service: Gastroenterology;;  . TUBAL LIGATION  1975  . VAGINAL DELIVERY     x2    Current Medications: No outpatient medications have been  marked as taking for the 12/22/20 encounter (Office Visit) with Quintella Reichert, MD.     Allergies:   Amoxicillin, Etodolac, Hydrochlorothiazide, Meloxicam, Sulfa antibiotics, Allopurinol, Metformin and related, Rosuvastatin, Sulfonamide derivatives, Jardiance [empagliflozin], and Lasix [furosemide]   Social History   Socioeconomic History  . Marital status: Married    Spouse name: Not on file  . Number of children: 2  . Years of education: Not on file  . Highest education level: Not on file  Occupational History  . Occupation: Retired Runner, broadcasting/film/video  Employer: retired  . Occupation: Works at [employer]  . Smoking status: Never Smoker  . Smokeless tobacco: Never Used  Vaping Use  . Vaping Use: Never used  Substance and Sexual Activity  . Alcohol use: No    Alcohol/week: 0.0 standard drinks  . Drug use: No  . Sexual activity: Not Currently  Other Topics Concern  . Not on file  Social History Narrative   Lives in [place] with husband. 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 No pets.   Social Determinants of Health   Financial Resource Strain: Low Risk   . Difficulty of Paying Living Expenses: Not hard at all  Food Insecurity: No Food Insecurity  . Worried About Charity fundraiser in the Last Year: Never true  . Ran Out of Food in the Last Year: Never true  Transportation Needs: No Transportation Needs  . Lack of Transportation (Medical): No  . Lack of Transportation (Non-Medical): No  Physical Activity: Not on file  Stress: No Stress Concern Present  . Feeling of Stress : Not at all  Social Connections: Socially Integrated  . Frequency of Communication with Friends and Family: More than three times a week  . Frequency of Social Gatherings with Friends and Family: More than three times a week  . Attends Religious Services: More than 4 times per year  . Active Member of Clubs or Organizations: Yes  . Attends Archivist Meetings: More  than 4 times per year  . Marital Status: Married     Family History: The patient's family history includes Bladder Cancer in her maternal grandmother; COPD in her father; Depression in her sister; Diabetes in her father, paternal grandfather, and sister; Heart attack in her father; Heart disease in her father and paternal grandfather; Hyperlipidemia in her father; Hypertension in her brother and father; Leukemia in her maternal grandfather; Lymphoma (age of onset: 56) in her mother; Meniere's disease in her brother; Pneumonia in her father; Schizophrenia in her daughter; Stroke in her father. There is no history of Colon cancer, Stomach cancer, Breast cancer, Colon polyps, Esophageal cancer, or Rectal cancer.  ROS:   Please see the history of present illness.    ROS  All other systems reviewed and negative.   EKGs/Labs/Other Studies Reviewed:    The following studies were reviewed today: none  EKG:  EKG is  ordered today.  The ekg ordered today demonstrates NSR with LAFB  Recent Labs: 08/15/2020: BUN 16; Creatinine, Ser 0.60; Hemoglobin 14.2; Platelets 231.0; Potassium 4.2; Sodium 141; TSH 5.67 11/14/2020: ALT 17   Recent Lipid Panel    Component Value Date/Time   CHOL 206 (H) 08/15/2020 0846   TRIG 217.0 (H) 08/15/2020 0846   HDL 70.60 08/15/2020 0846   CHOLHDL 3 08/15/2020 0846   VLDL 43.4 (H) 08/15/2020 0846   LDLCALC 58 04/30/2017 1048   LDLDIRECT 114.0 11/14/2020 1034    Physical Exam:    VS:  BP 130/64   Pulse 94   Ht 5\' 5"  (1.651 m)   Wt 202 lb 3.2 oz (91.7 kg)   SpO2 91%   BMI 33.65 kg/m     Wt Readings from Last 3 Encounters:  12/22/20 202 lb 3.2 oz (91.7 kg)  11/14/20 203 lb 6.4 oz (92.3 kg)  09/22/20 204 lb 6.4 oz (92.7 kg)     GEN: Well nourished, well developed in no acute distress HEENT: Normal NECK: No JVD; No carotid bruits LYMPHATICS: No lymphadenopathy CARDIAC:RRR,  no murmurs, rubs, gallops RESPIRATORY:  Clear to auscultation without rales,  wheezing or rhonchi  ABDOMEN: Soft, non-tender, non-distended MUSCULOSKELETAL:  Trace LE edema; No deformity  SKIN: Warm and dry NEUROLOGIC:  Alert and oriented x 3 PSYCHIATRIC:  Normal affect    ASSESSMENT:    1. Chronic diastolic heart failure ([place])   2. SOB (shortness of breath)   3. Controlled type 2 diabetes mellitus with diabetic nephropathy, without long-term current use of insulin ([place])   4. Essential hypertension    PLAN:    In order of problems listed above:  1.  Chronic diastolic CHF  -she has had some problems with medical compliance in the past with her diuretics -we have talked in the past about in the setting of pulmonary fibrosis, any additional fluid retention will worsen her breathing -we have also discussed at prior OV the  importance of using her O2 as prescribed when she is exerting herself.  She knows that chronic hypoxemia may worsen pulmonary HTN -I restressed the importance of following a low Na diet. She is still salting some food>>encouraged her to use Mrs. Dash instead -SCr stable at 0.6 and K+ 4.2 in Dec 2021 -she still has LLE edema and I encouraged her to use her compression hose>>she bought some but says she cannot wear them in the hot weather -she stopped using diuretics altogether as they dried her out  2.  Chronic SOB  - this is chronic secondary to chronic diastolic CHF, medical noncompliance with Na and O2 and pulmonary fibrosis -SOB had worsened on the last OV felt likely related to being more sedentary and also noncompliance with her O2 and volume overload from noncompliance with low Na diet.  BNP was normal a year ago -continue O2 as needed -patient was supposed to see Pulmonary with Dr. Carlis Abbott but never followed through -I will refer back to Pulmonary  4.  Type 2 diabetes mellitus  -followed by her PCP. -This appears diet controlled.   -continue ARB for renal protection -HbA1C was 6.9% in Dec 2021  5.  Hypertension   -BP is  adequately controlled on exam today -continue Carvedilol 12.5mg  BID and Losartan 25mg  daily   Medication Adjustments/Labs and Tests Ordered: Current medicines are reviewed at length with the patient today.  Concerns regarding medicines are outlined above.  No orders of the defined types were placed in this encounter.  No orders of the defined types were placed in this encounter.   Signed, Fransico Him, MD  12/22/2020 11:30 AM    [place]

## 2020-12-22 NOTE — Patient Instructions (Signed)
Medication Instructions:  Your physician recommends that you continue on your current medications as directed. Please refer to the Current Medication list given to you today.  *If you need a refill on your cardiac medications before your next appointment, please call your pharmacy*  Follow-Up: At [facility], you and your health needs are our priority.  As part of our continuing mission to provide you with exceptional heart care, we have created designated Provider Care Teams.  These Care Teams include your primary Cardiologist (physician) and Advanced Practice Providers (APPs -  Physician Assistants and Nurse Practitioners) who all work together to provide you with the care you need, when you need it.  Your next appointment:   1 year(s)  The format for your next appointment:   In Person  Provider:   You may see Fransico Him, MD or one of the following Advanced Practice Providers on your designated Care Team:    Melina Copa, PA-C  Ermalinda Barrios, PA-C    Other Instructions You have been referred back to see the pulmonologist

## 2020-12-23 DIAGNOSIS — H26493 Other secondary cataract, bilateral: Secondary | ICD-10-CM | POA: Diagnosis not present

## 2020-12-26 ENCOUNTER — Other Ambulatory Visit: Payer: Self-pay

## 2020-12-26 ENCOUNTER — Other Ambulatory Visit (INDEPENDENT_AMBULATORY_CARE_PROVIDER_SITE_OTHER): Payer: Medicare PPO

## 2020-12-26 ENCOUNTER — Other Ambulatory Visit (HOSPITAL_COMMUNITY): Payer: Self-pay | Admitting: Cardiology

## 2020-12-26 DIAGNOSIS — R0602 Shortness of breath: Secondary | ICD-10-CM | POA: Diagnosis not present

## 2020-12-26 DIAGNOSIS — I10 Essential (primary) hypertension: Secondary | ICD-10-CM

## 2020-12-26 DIAGNOSIS — I5032 Chronic diastolic (congestive) heart failure: Secondary | ICD-10-CM

## 2020-12-26 DIAGNOSIS — E782 Mixed hyperlipidemia: Secondary | ICD-10-CM | POA: Diagnosis not present

## 2020-12-26 LAB — HEPATIC FUNCTION PANEL
ALT: 17 U/L (ref 0–35)
AST: 16 U/L (ref 0–37)
Albumin: 3.9 g/dL (ref 3.5–5.2)
Alkaline Phosphatase: 75 U/L (ref 39–117)
Bilirubin, Direct: 0.1 mg/dL (ref 0.0–0.3)
Total Bilirubin: 0.4 mg/dL (ref 0.2–1.2)
Total Protein: 6.9 g/dL (ref 6.0–8.3)

## 2020-12-26 LAB — LDL CHOLESTEROL, DIRECT: Direct LDL: 86 mg/dL

## 2021-01-01 DIAGNOSIS — J9610 Chronic respiratory failure, unspecified whether with hypoxia or hypercapnia: Secondary | ICD-10-CM | POA: Diagnosis not present

## 2021-01-03 DIAGNOSIS — G4733 Obstructive sleep apnea (adult) (pediatric): Secondary | ICD-10-CM | POA: Diagnosis not present

## 2021-01-06 ENCOUNTER — Telehealth: Payer: Self-pay

## 2021-01-06 NOTE — Telephone Encounter (Signed)
Pt called and states that she will take the Pravastatin every day-starting today. I scheduled her for a 6wk lab appt on 02/17/21 to recheck. FYI

## 2021-01-30 ENCOUNTER — Other Ambulatory Visit: Payer: Self-pay | Admitting: Family Medicine

## 2021-01-30 DIAGNOSIS — Z1231 Encounter for screening mammogram for malignant neoplasm of breast: Secondary | ICD-10-CM

## 2021-02-01 DIAGNOSIS — J9610 Chronic respiratory failure, unspecified whether with hypoxia or hypercapnia: Secondary | ICD-10-CM | POA: Diagnosis not present

## 2021-02-02 ENCOUNTER — Other Ambulatory Visit: Payer: Self-pay | Admitting: Family Medicine

## 2021-02-06 ENCOUNTER — Telehealth: Payer: Self-pay | Admitting: Family Medicine

## 2021-02-06 DIAGNOSIS — E782 Mixed hyperlipidemia: Secondary | ICD-10-CM

## 2021-02-06 MED ORDER — PRAVASTATIN SODIUM 20 MG PO TABS: ORAL_TABLET | ORAL | 3 refills | Status: DC

## 2021-02-06 NOTE — Telephone Encounter (Signed)
Pt needs a refill on pravastatin (PRAVACHOL) 20 MG tablet sent to [facility]

## 2021-02-06 NOTE — Addendum Note (Signed)
Addended byElpidio Galea T on: 02/06/2021 04:58 PM   Modules accepted: Orders

## 2021-02-07 ENCOUNTER — Encounter: Payer: Self-pay | Admitting: Emergency Medicine

## 2021-02-07 ENCOUNTER — Ambulatory Visit (INDEPENDENT_AMBULATORY_CARE_PROVIDER_SITE_OTHER): Payer: Medicare PPO | Admitting: Emergency Medicine

## 2021-02-07 ENCOUNTER — Ambulatory Visit: Payer: Medicare PPO

## 2021-02-07 ENCOUNTER — Other Ambulatory Visit: Payer: Self-pay

## 2021-02-07 VITALS — BP 112/74 | HR 93 | Temp 97.4°F | Ht 65.0 in | Wt 202.2 lb

## 2021-02-07 DIAGNOSIS — I5032 Chronic diastolic (congestive) heart failure: Secondary | ICD-10-CM

## 2021-02-07 DIAGNOSIS — J984 Other disorders of lung: Secondary | ICD-10-CM | POA: Diagnosis not present

## 2021-02-07 DIAGNOSIS — G4733 Obstructive sleep apnea (adult) (pediatric): Secondary | ICD-10-CM

## 2021-02-07 DIAGNOSIS — J9611 Chronic respiratory failure with hypoxia: Secondary | ICD-10-CM

## 2021-02-07 DIAGNOSIS — R0600 Dyspnea, unspecified: Secondary | ICD-10-CM | POA: Diagnosis not present

## 2021-02-07 DIAGNOSIS — R0609 Other forms of dyspnea: Secondary | ICD-10-CM

## 2021-02-07 MED ORDER — ALBUTEROL SULFATE HFA 108 (90 BASE) MCG/ACT IN AERS: 2.0000 | INHALATION_SPRAY | RESPIRATORY_TRACT | 6 refills | Status: DC | PRN

## 2021-02-07 NOTE — Assessment & Plan Note (Signed)
Treated, good compliance and benefit

## 2021-02-07 NOTE — Assessment & Plan Note (Signed)
Significant contributor to her multifactorial SOB, being treated. She does not like to use diuretics, does so prn.

## 2021-02-07 NOTE — Assessment & Plan Note (Signed)
Poor compliance, needs to wear w all exertion but does not like to do so

## 2021-02-07 NOTE — Patient Instructions (Addendum)
You need to wear your oxygen reliably at 3 L/min especially when you are exerting yourself. Continue your CPAP every night while you are sleeping. Continue your diuretics as ordered by Dr. Radford Pax We will do a trial of albuterol to see if you get benefit.  You can try taking 2 puffs if you need it for shortness of breath.  You might also want to try using 2 puffs about 10 minutes prior to exertion to see if it helps your functional capacity We will refer you to pulmonary rehab at [place] Follow Dr. Lamonte Sakai in 6 months with a chest x-ray on the same day.  Call sooner if you have any new issues or problems.

## 2021-02-07 NOTE — Progress Notes (Signed)
Subjective:    Patient ID: Betty [name], female    DOB: 20-Nov-1945, 75 y.o.   MRN: [id number]  HPI 75 year old never smoker who has longstanding shortness of breath.  History of OSA on CPAP, diabetes, hypertension, nonischemic cardiomyopathy with subsequent improvement in LV function.  Her pulmonary function testing has been consistent with restrictive lung disease and a markedly decreased diffusion capacity.  Most recently with some evidence of mixed restriction and obstruction, possible small airways disease  She underwent a surgical lung biopsy to evaluate possible ILD (diffuse mosaic attenuation).  No evidence for connective tissue disease so she underwent surgical lung biopsy that showed mild peribronchial fibrotic change without inflammation and few areas of significant arterial pathic PAH.  She has had right heart catheterization that was normal.  She has diastolic dysfunction that was noted on exercise right heart catheterization.    She has O2 that she has been told to wear at 3L/min at all times, but she does not use reliably. Not on any BD's - didn't really benefit. Some asymmetric swelling LE L>R with negative doppler in the past. Wt stable. Uses diuretics prn. She feels that her breathing is at baseline, remains sedentary, breathing does significantly limit her activity. Great compliance with her CPAP and good clinical benefit.    Review of Systems As per HPI  Past Medical History:  Diagnosis Date   Allergic rhinitis    never tested, fall and spring   Arthritis    hands,knees, feet   Cataracts, bilateral    not surgical yet   CHOLECYSTECTOMY, HX OF 10/08/2007   Qualifier: Diagnosis of  By: Council Mechanic MD, [name]    Chronic diastolic CHF (congestive heart failure) (HCC)    Chronic respiratory failure ([place])    Diabetes mellitus without complication ([place])    Diverticulosis    problems with frequent gas, burping   Glucose intolerance (impaired glucose tolerance)     Gout    History of chicken pox    Hyperlipidemia    Hypertension    NICM (nonischemic cardiomyopathy) ([place]) 2002   EF 25%; improved to normal - echo 4/08: EF 50-55%, mild MR, mild LAE, mild TR;    cath 3/03: normal cors, EF 40%   Obesity    Oxygen deficiency 2017   at night   PONV (postoperative nausea and vomiting)    after wisdom tooth extraction   Restrictive lung disease    Skin cancer    skin cancers only   Sleep apnea    cpap settings 4     Family History  Problem Relation Age of Onset   Lymphoma Mother 71   COPD Father        was a smoker   Stroke Father    Pneumonia Father    Diabetes Father    Hyperlipidemia Father    Heart disease Father    Heart attack Father    Hypertension Father    Diabetes Sister    Depression Sister    Meniere's disease Brother    Diabetes Paternal Grandfather    Heart disease Paternal Grandfather    Schizophrenia Daughter    Bladder Cancer Maternal Grandmother    Leukemia Maternal Grandfather    Hypertension Brother    Colon cancer Neg Hx    Stomach cancer Neg Hx    Breast cancer Neg Hx    Colon polyps Neg Hx    Esophageal cancer Neg Hx    Rectal cancer Neg Hx  Social History   Socioeconomic History   Marital status: Married    Spouse name: Not on file   Number of children: 2   Years of education: Not on file   Highest education level: Not on file  Occupational History   Occupation: Retired Product manager: retired   Occupation: Works at [employer]   Smoking status: Never   Smokeless tobacco: Never  Vaping Use   Vaping Use: Never used  Substance and Sexual Activity   Alcohol use: No    Alcohol/week: 0.0 standard drinks   Drug use: No   Sexual activity: Not Currently  Other Topics Concern   Not on file  Social History Narrative   Lives in [place] with husband. 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 No pets.   Social Determinants of Health   Financial Resource Strain:  Low Risk    Difficulty of Paying Living Expenses: Not hard at all  Food Insecurity: No Food Insecurity   Worried About Charity fundraiser in the Last Year: Never true   [place] in the Last Year: Never true  Transportation Needs: No Transportation Needs   Lack of Transportation (Medical): No   Lack of Transportation (Non-Medical): No  Physical Activity: Not on file  Stress: No Stress Concern Present   Feeling of Stress : Not at all  Social Connections: Socially Integrated   Frequency of Communication with Friends and Family: More than three times a week   Frequency of Social Gatherings with Friends and Family: More than three times a week   Attends Religious Services: More than 4 times per year   Active Member of [employer] or Organizations: Yes   Attends Music therapist: More than 4 times per year   Marital Status: Married  Human resources officer Violence: Not At Risk   Fear of Current or Ex-Partner: No   Emotionally Abused: No   Physically Abused: No   Sexually Abused: No     Allergies  Allergen Reactions   Amoxicillin Rash    REACTION: RASH   Etodolac Other (See Comments)    Bloody stool    Hydrochlorothiazide Other (See Comments)    Increases gout flare   Meloxicam Other (See Comments)    constipation   Sulfa Antibiotics Other (See Comments)    rash   Allopurinol    Metformin And Related     diarrhea   Rosuvastatin Other (See Comments)    "extreme joint pain"   Sulfonamide Derivatives     REACTION: RASH   Jardiance [Empagliflozin] Other (See Comments)    Yeast infection   Lasix [Furosemide] Other (See Comments)    Pt states "it dried everything out of me"     Outpatient Medications Prior to Visit  Medication Sig Dispense Refill   acetic acid 2 % otic solution Insert saturated wick of cotton; keep moist 24 hours by adding 3 to 5 drops every 4 to 6 hours; remove wick after 24 hours and instill 5 drops 3 to 4 times daily 15 mL 0   aspirin 81 MG EC  tablet Take 1 tablet (81 mg total) by mouth daily. 90 tablet 2   CALCIUM-MAGNESIUM-VITAMIN D PO Take 1 tablet by mouth daily.     carvedilol (COREG) 12.5 MG tablet TAKE 1 TABLET BY MOUTH TWICE A DAY WITH MEALS 60 tablet 11   Coenzyme Q10 (CO Q 10 PO) Take 200 mg by mouth at bedtime.  Colchicine (MITIGARE) 0.6 MG CAPS Day 1: take 1.2 mg (2 tablets) by mouth at the first sign of flare, followed by 0.6 mg (1 tablet) by mouth after 1 hour. Day 2 and thereafter: take 0.6 mg (1 tablet) by mouth daily until flare has resolved. 30 capsule 0   Fish Oil OIL Take 1,200 mg by mouth 2 (two) times daily. GUMMIES     losartan (COZAAR) 50 MG tablet TAKE 1/2 TABLET BY MOUTH DAILY 45 tablet 3   metFORMIN (GLUCOPHAGE) 500 MG tablet Take 1,000 mg by mouth daily with breakfast.     multivitamin (THERAGRAN) per tablet Take 1 tablet by mouth daily.     nystatin cream (MYCOSTATIN) Apply 1 application topically 2 (two) times daily. 30 g 0   polyvinyl alcohol (LIQUIFILM TEARS) 1.4 % ophthalmic solution Place 1 drop into both eyes every morning.     pravastatin (PRAVACHOL) 20 MG tablet Take 1 tablet (20 mg) by mouth on Monday, Wednesday, and Friday 36 tablet 3   No facility-administered medications prior to visit.           Objective:   Physical Exam Vitals:   02/07/21 1509  BP: 112/74  Pulse: 93  Temp: (!) 97.4 F (36.3 C)  TempSrc: Temporal  SpO2: 93%  Weight: 202 lb 3.2 oz (91.7 kg)  Height: 5\' 5"  (1.651 m)   Gen: [place], obese woman, in no distress,  normal affect  ENT: No lesions,  mouth clear,  oropharynx clear, no postnasal drip  Neck: No JVD, no stridor  Lungs: No use of accessory muscles, distant, no crackles or wheezing on normal respiration, no wheeze on forced expiration  Cardiovascular: RRR, heart sounds normal, no murmur or gallops, no peripheral edema  Musculoskeletal: No deformities, no cyanosis or clubbing  Neuro: alert, awake, non focal  Skin: Warm, no lesions or  rash       Assessment & Plan:  Obstructive sleep apnea Treated, good compliance and benefit  Chronic hypoxemic respiratory failure (HCC) Poor compliance, needs to wear w all exertion but does not like to do so  Chronic diastolic heart failure (HCC) Significant contributor to her multifactorial SOB, being treated. She does not like to use diuretics, does so prn.   DOE (dyspnea on exertion) Multifactorial shortness of breath.  Unclear whether there is a contributor of obstructive lung disease.  Has been tried on bronchodilators before but is willing to retry.  I think she would benefit from cardiopulmonary rehab.  She is willing to do this and we will arrange for her.  Consider repeat PFT at some point in future.  Repeat chest x-ray next time.  You need to wear your oxygen reliably at 3 L/min especially when you are exerting yourself. Continue your CPAP every night while you are sleeping. Continue your diuretics as ordered by Dr. Radford Pax We will do a trial of albuterol to see if you get benefit.  You can try taking 2 puffs if you need it for shortness of breath.  You might also want to try using 2 puffs about 10 minutes prior to exertion to see if it helps your functional capacity We will refer you to pulmonary rehab at [place] Follow Dr. Lamonte Sakai in 6 months with a chest x-ray on the same day.  Call sooner if you have any new issues or problems.   Baltazar Apo, MD, PhD 02/07/2021, 5:16 PM [facility] [phone] or if no answer before 7:00PM call [phone] For any issues after 7:00PM  please call eLink [phone]

## 2021-02-07 NOTE — Assessment & Plan Note (Signed)
Multifactorial shortness of breath.  Unclear whether there is a contributor of obstructive lung disease.  Has been tried on bronchodilators before but is willing to retry.  I think she would benefit from cardiopulmonary rehab.  She is willing to do this and we will arrange for her.  Consider repeat PFT at some point in future.  Repeat chest x-ray next time.  You need to wear your oxygen reliably at 3 L/min especially when you are exerting yourself. Continue your CPAP every night while you are sleeping. Continue your diuretics as ordered by Dr. Radford Pax We will do a trial of albuterol to see if you get benefit.  You can try taking 2 puffs if you need it for shortness of breath.  You might also want to try using 2 puffs about 10 minutes prior to exertion to see if it helps your functional capacity We will refer you to pulmonary rehab at [facility] Follow Dr. Lamonte Sakai in 6 months with a chest x-ray on the same day.  Call sooner if you have any new issues or problems.

## 2021-02-10 ENCOUNTER — Other Ambulatory Visit: Payer: Self-pay

## 2021-02-10 DIAGNOSIS — E782 Mixed hyperlipidemia: Secondary | ICD-10-CM

## 2021-02-10 MED ORDER — PRAVASTATIN SODIUM 20 MG PO TABS: 20.0000 mg | ORAL_TABLET | Freq: Every day | ORAL | 1 refills | Status: DC

## 2021-02-10 NOTE — Progress Notes (Signed)
Patient called and stated she needed a refill on the Pravastatin.  Patient recently had labs and the provider informed her to stop taking the pravastatin on Mon,wed, and Friday and start taking it daily and I sent a refill.  Betty [name],cma

## 2021-02-14 ENCOUNTER — Telehealth: Payer: Self-pay | Admitting: *Deleted

## 2021-02-14 DIAGNOSIS — E785 Hyperlipidemia, unspecified: Secondary | ICD-10-CM

## 2021-02-14 NOTE — Telephone Encounter (Signed)
Please place future orders for lab appt.  

## 2021-02-15 NOTE — Telephone Encounter (Signed)
Orders placed.

## 2021-02-17 ENCOUNTER — Other Ambulatory Visit (INDEPENDENT_AMBULATORY_CARE_PROVIDER_SITE_OTHER): Payer: Medicare PPO

## 2021-02-17 ENCOUNTER — Other Ambulatory Visit: Payer: Self-pay

## 2021-02-17 DIAGNOSIS — E785 Hyperlipidemia, unspecified: Secondary | ICD-10-CM | POA: Diagnosis not present

## 2021-02-17 LAB — HEPATIC FUNCTION PANEL
ALT: 16 U/L (ref 0–35)
AST: 15 U/L (ref 0–37)
Albumin: 3.9 g/dL (ref 3.5–5.2)
Alkaline Phosphatase: 78 U/L (ref 39–117)
Bilirubin, Direct: 0.1 mg/dL (ref 0.0–0.3)
Total Bilirubin: 0.4 mg/dL (ref 0.2–1.2)
Total Protein: 6.9 g/dL (ref 6.0–8.3)

## 2021-02-21 ENCOUNTER — Other Ambulatory Visit (INDEPENDENT_AMBULATORY_CARE_PROVIDER_SITE_OTHER): Payer: Medicare PPO

## 2021-02-21 ENCOUNTER — Other Ambulatory Visit: Payer: Self-pay

## 2021-02-21 DIAGNOSIS — E785 Hyperlipidemia, unspecified: Secondary | ICD-10-CM | POA: Diagnosis not present

## 2021-02-21 LAB — LDL CHOLESTEROL, DIRECT: Direct LDL: 79 mg/dL

## 2021-03-03 DIAGNOSIS — J9610 Chronic respiratory failure, unspecified whether with hypoxia or hypercapnia: Secondary | ICD-10-CM | POA: Diagnosis not present

## 2021-03-07 ENCOUNTER — Other Ambulatory Visit: Payer: Self-pay

## 2021-03-07 ENCOUNTER — Ambulatory Visit
Admission: RE | Admit: 2021-03-07 | Discharge: 2021-03-07 | Disposition: A | Discharge status: Home / Self Care | Payer: Medicare PPO | Source: Ambulatory Visit | Attending: Family Medicine | Admitting: Family Medicine

## 2021-03-07 DIAGNOSIS — Z1231 Encounter for screening mammogram for malignant neoplasm of breast: Secondary | ICD-10-CM | POA: Diagnosis not present

## 2021-03-19 ENCOUNTER — Other Ambulatory Visit: Payer: Self-pay | Admitting: Family Medicine

## 2021-03-19 DIAGNOSIS — E785 Hyperlipidemia, unspecified: Secondary | ICD-10-CM

## 2021-03-23 ENCOUNTER — Other Ambulatory Visit: Payer: Self-pay

## 2021-03-23 ENCOUNTER — Encounter: Payer: Self-pay | Admitting: Obstetrics and Gynecology

## 2021-03-23 ENCOUNTER — Ambulatory Visit: Payer: Medicare PPO | Admitting: Obstetrics and Gynecology

## 2021-03-23 VITALS — BP 139/83 | HR 94 | Ht 65.0 in | Wt 204.6 lb

## 2021-03-23 DIAGNOSIS — L900 Lichen sclerosus et atrophicus: Secondary | ICD-10-CM | POA: Diagnosis not present

## 2021-03-23 NOTE — Progress Notes (Signed)
HPI:      [name] is a 75 y.o. No obstetric history on file. who LMP was No LMP recorded. Patient is postmenopausal.  Subjective:   She presents today for follow-up of her lichen sclerosus.  She has been using clobetasol twice weekly and this has been successful for her.  She reports no symptoms at this time.  She has been very good about continuation of this medication.    Hx: The following portions of the patient's history were reviewed and updated as appropriate:             She  has a past medical history of Allergic rhinitis, Arthritis, Cataracts, bilateral, CHOLECYSTECTOMY, HX OF (10/08/2007), Chronic diastolic CHF (congestive heart failure) ([place]), Chronic respiratory failure ([place]), Diabetes mellitus without complication ([place]), Diverticulosis, Glucose intolerance (impaired glucose tolerance), Gout, History of chicken pox, Hyperlipidemia, Hypertension, NICM (nonischemic cardiomyopathy) ([place]) (2002), Obesity, Oxygen deficiency (2017), PONV (postoperative nausea and vomiting), Restrictive lung disease, Skin cancer, and Sleep apnea. She does not have any pertinent problems on file. She  has a past surgical history that includes nsvd; Cystectomy (1996); choleystectomy (02/2002); Vaginal delivery; Tubal ligation (1975); Dilation and curettage of uterus (2011); Induced abortion; Cardiac catheterization; Bilateral VATS ablation (Right); Colonoscopy with propofol (N/A, 12/20/2014); Breast excisional biopsy (Left, 80's); Cholecystectomy; Colonoscopy with propofol (N/A, 03/22/2020); polypectomy (03/22/2020); Cataract extraction w/PHACO (Right, 06/21/2020); and Cataract extraction w/PHACO (Left, 07/12/2020). Her family history includes Bladder Cancer in her maternal grandmother; COPD in her father; Depression in her sister; Diabetes in her father, paternal grandfather, and sister; Heart attack in her father; Heart disease in her father and paternal grandfather; Hyperlipidemia in her father;  Hypertension in her brother and father; Leukemia in her maternal grandfather; Lymphoma (age of onset: 38) in her mother; Meniere's disease in her brother; Pneumonia in her father; Schizophrenia in her daughter; Stroke in her father. She  reports that she has never smoked. She has never used smokeless tobacco. She reports that she does not drink alcohol and does not use drugs. She has a current medication list which includes the following prescription(s): acetic acid, albuterol, aspirin, calcium-magnesium-vitamin d, carvedilol, coenzyme q10, colchicine, fish oil, losartan, metformin, multivitamin, nystatin cream, polyvinyl alcohol, and pravastatin. She is allergic to amoxicillin, etodolac, hydrochlorothiazide, meloxicam, sulfa antibiotics, allopurinol, metformin and related, rosuvastatin, sulfonamide derivatives, jardiance [empagliflozin], and lasix [furosemide].       Review of Systems:  Review of Systems  Constitutional: Denied constitutional symptoms, night sweats, recent illness, fatigue, fever, insomnia and weight loss.  Eyes: Denied eye symptoms, eye pain, photophobia, vision change and visual disturbance.  Ears/Nose/Throat/Neck: Denied ear, nose, throat or neck symptoms, hearing loss, nasal discharge, sinus congestion and sore throat.  Cardiovascular: Denied cardiovascular symptoms, arrhythmia, chest pain/pressure, edema, exercise intolerance, orthopnea and palpitations.  Respiratory: Denied pulmonary symptoms, asthma, pleuritic pain, productive sputum, cough, dyspnea and wheezing.  Gastrointestinal: Denied, gastro-esophageal reflux, melena, nausea and vomiting.  Genitourinary: Denied genitourinary symptoms including symptomatic vaginal discharge, pelvic relaxation issues, and urinary complaints.  Musculoskeletal: Denied musculoskeletal symptoms, stiffness, swelling, muscle weakness and myalgia.  Dermatologic: Denied dermatology symptoms, rash and scar.  Neurologic: Denied neurology symptoms,  dizziness, headache, neck pain and syncope.  Psychiatric: Denied psychiatric symptoms, anxiety and depression.  Endocrine: Denied endocrine symptoms including hot flashes and night sweats.   Meds:   Current Outpatient Medications on File Prior to Visit  Medication Sig Dispense Refill   acetic acid 2 % otic solution Insert saturated wick of cotton; keep moist 24 hours  by adding 3 to 5 drops every 4 to 6 hours; remove wick after 24 hours and instill 5 drops 3 to 4 times daily 15 mL 0   albuterol (VENTOLIN HFA) 108 (90 Base) MCG/ACT inhaler Inhale 2 puffs into the lungs every 4 (four) hours as needed for wheezing or shortness of breath. 8 g 6   aspirin 81 MG EC tablet Take 1 tablet (81 mg total) by mouth daily. 90 tablet 2   CALCIUM-MAGNESIUM-VITAMIN D PO Take 1 tablet by mouth daily.     carvedilol (COREG) 12.5 MG tablet TAKE 1 TABLET BY MOUTH TWICE A DAY WITH MEALS 60 tablet 11   Coenzyme Q10 (CO Q 10 PO) Take 200 mg by mouth at bedtime.      Colchicine (MITIGARE) 0.6 MG CAPS Day 1: take 1.2 mg (2 tablets) by mouth at the first sign of flare, followed by 0.6 mg (1 tablet) by mouth after 1 hour. Day 2 and thereafter: take 0.6 mg (1 tablet) by mouth daily until flare has resolved. 30 capsule 0   Fish Oil OIL Take 1,200 mg by mouth 2 (two) times daily. GUMMIES     losartan (COZAAR) 50 MG tablet TAKE 1/2 TABLET BY MOUTH DAILY 45 tablet 3   metFORMIN (GLUCOPHAGE) 500 MG tablet Take 1,000 mg by mouth daily with breakfast.     multivitamin (THERAGRAN) per tablet Take 1 tablet by mouth daily.     nystatin cream (MYCOSTATIN) Apply 1 application topically 2 (two) times daily. 30 g 0   polyvinyl alcohol (LIQUIFILM TEARS) 1.4 % ophthalmic solution Place 1 drop into both eyes every morning.     pravastatin (PRAVACHOL) 20 MG tablet Take 1 tablet (20 mg total) by mouth daily. 90 tablet 1   No current facility-administered medications on file prior to visit.      Objective:     Vitals:   03/23/21 1044   BP: 139/83  Pulse: 94   Filed Weights   03/23/21 1044  Weight: 204 lb 9.6 oz (92.8 kg)              Physical examination   Pelvic:   Vulva: Normal appearance.  No lesions.  Some minor anterior labial fusion -overall appears improved from last exam.  Vagina: No lesions or abnormalities noted.  Support: Normal pelvic support.  Urethra No masses tenderness or scarring.  Meatus Normal size without lesions or prolapse.  Cervix: Normal appearance.  No lesions.  Anus: Normal exam.  No lesions.  Perineum: Normal exam.  No lesions.     Assessment:    No obstetric history on file. Patient Active Problem List   Diagnosis Date Noted   Ear itching 11/14/2020   Candidal intertrigo 08/15/2020   Vaginal discharge 08/15/2020   Fall 04/20/2020   Benign neoplasm of sigmoid colon    Pulmonary HTN ([place]) 10/27/2019   Elevated LDL cholesterol level 10/27/2019   Cervical radiculopathy 10/14/2019   Flatulence 04/10/2019   Restrictive lung disease 08/06/2016   Left-sided low back pain with left-sided sciatica 05/01/2016   History of colonic polyps    Benign neoplasm of ascending colon    Benign neoplasm of transverse colon    Rash and nonspecific skin eruption 08/30/2014   Postmenopausal estrogen deficiency 08/30/2014   Left Achilles tendinitis 04/23/2014   Chronic diastolic heart failure ([place]) 04/08/2014   Obesity (BMI 30-39.9) 04/09/2013   Diabetes mellitus type 2, controlled ([place]) 01/07/2013   Edema 01/07/2013   Chronic hypoxemic respiratory failure ([place]) 11/11/2012  Obstructive sleep apnea 11/11/2012   DOE (dyspnea on exertion) 08/18/2012   Fatigue 08/18/2012   Meralgia paresthetica of left side 08/20/2011   Myalgia 05/03/2011   Leg pain, lateral 04/12/2011   GOUT 12/26/2009   CARDIOMYOPATHY 10/08/2007   ALLERGIC RHINITIS, SEASONAL 10/08/2007   HYPERLIPIDEMIA, MIXED 05/02/2007     1. Lichen sclerosus     Patient controlling this well with twice weekly clobetasol  use.   Plan:            1.  Emphasized the recurrent nature of lichen sclerosus.  Patient will continue to use clobetasol twice weekly. Orders No orders of the defined types were placed in this encounter.   No orders of the defined types were placed in this encounter.     F/U  Return in about 1 year (around 03/23/2022). I spent 21 minutes involved in the care of this patient preparing to see the patient by obtaining and reviewing her medical history (including labs, imaging tests and prior procedures), documenting clinical information in the electronic health record (EHR), counseling and coordinating care plans, writing and sending prescriptions, ordering tests or procedures and directly communicating with the patient by discussing pertinent items from her history and physical exam as well as detailing my assessment and plan as noted above so that she has an informed understanding.  All of her questions were answered.  Finis Bud, M.D. 03/23/2021 10:54 AM

## 2021-04-03 DIAGNOSIS — J9610 Chronic respiratory failure, unspecified whether with hypoxia or hypercapnia: Secondary | ICD-10-CM | POA: Diagnosis not present

## 2021-04-05 DIAGNOSIS — G4733 Obstructive sleep apnea (adult) (pediatric): Secondary | ICD-10-CM | POA: Diagnosis not present

## 2021-04-07 ENCOUNTER — Other Ambulatory Visit: Payer: Self-pay | Admitting: Family Medicine

## 2021-04-10 ENCOUNTER — Other Ambulatory Visit: Payer: Self-pay

## 2021-04-10 DIAGNOSIS — E1121 Type 2 diabetes mellitus with diabetic nephropathy: Secondary | ICD-10-CM

## 2021-04-10 NOTE — Telephone Encounter (Signed)
I believe she has been on the extended release version of metformin.  Can you contact her to confirm this?  I can then send a refill in.  Thanks.

## 2021-04-11 ENCOUNTER — Other Ambulatory Visit: Payer: Self-pay

## 2021-04-11 ENCOUNTER — Other Ambulatory Visit (INDEPENDENT_AMBULATORY_CARE_PROVIDER_SITE_OTHER): Payer: Medicare PPO

## 2021-04-11 DIAGNOSIS — E785 Hyperlipidemia, unspecified: Secondary | ICD-10-CM

## 2021-04-11 LAB — HEPATIC FUNCTION PANEL
ALT: 16 U/L (ref 0–35)
AST: 21 U/L (ref 0–37)
Albumin: 3.9 g/dL (ref 3.5–5.2)
Alkaline Phosphatase: 74 U/L (ref 39–117)
Bilirubin, Direct: 0.1 mg/dL (ref 0.0–0.3)
Total Bilirubin: 0.6 mg/dL (ref 0.2–1.2)
Total Protein: 7.1 g/dL (ref 6.0–8.3)

## 2021-04-11 LAB — LDL CHOLESTEROL, DIRECT: Direct LDL: 63 mg/dL

## 2021-04-13 ENCOUNTER — Ambulatory Visit (INDEPENDENT_AMBULATORY_CARE_PROVIDER_SITE_OTHER): Payer: Medicare PPO

## 2021-04-13 VITALS — Ht 65.0 in | Wt 204.0 lb

## 2021-04-13 DIAGNOSIS — Z0000 Encounter for general adult medical examination without abnormal findings: Secondary | ICD-10-CM | POA: Diagnosis not present

## 2021-04-13 NOTE — Patient Instructions (Addendum)
Ms. Hansler , Thank you for taking time to come for your Medicare Wellness Visit. I appreciate your ongoing commitment to your health goals. Please review the following plan we discussed and let me know if I can assist you in the future.   These are the goals we discussed:  Goals      Healthy Lifestyle     Stay active; chair exercises Healthy diet            This is a list of the screening recommended for you and due dates:  Health Maintenance  Topic Date Due   Flu Shot  05/30/2021*   COVID-19 Vaccine (5 - Booster for Pfizer series) 04/24/2021   Hemoglobin A1C  05/17/2021   Eye exam for diabetics  05/19/2021   Complete foot exam   04/13/2022   Mammogram  03/08/2023   Tetanus Vaccine  10/15/2023   Colon Cancer Screening  03/22/2025   DEXA scan (bone density measurement)  Completed   Hepatitis C Screening: USPSTF Recommendation to screen - Ages 82-79 yo.  Completed   Pneumonia vaccines  Completed   Zoster (Shingles) Vaccine  Completed   HPV Vaccine  Aged Out  *Topic was postponed. The date shown is not the original due date.    Advanced directives: not yet completed  Conditions/risks identified: none new  Follow up in one year for your annual wellness visit    Preventive Care 65 Years and Older, Female Preventive care refers to lifestyle choices and visits with your health care provider that can promote health and wellness. What does preventive care include? A yearly physical exam. This is also called an annual well check. Dental exams once or twice a year. Routine eye exams. Ask your health care provider how often you should have your eyes checked. Personal lifestyle choices, including: Daily care of your teeth and gums. Regular physical activity. Eating a healthy diet. Avoiding tobacco and drug use. Limiting alcohol use. Practicing safe sex. Taking low-dose aspirin every day. Taking vitamin and mineral supplements as recommended by your health care  provider. What happens during an annual well check? The services and screenings done by your health care provider during your annual well check will depend on your age, overall health, lifestyle risk factors, and family history of disease. Counseling  Your health care provider may ask you questions about your: Alcohol use. Tobacco use. Drug use. Emotional well-being. Home and relationship well-being. Sexual activity. Eating habits. History of falls. Memory and ability to understand (cognition). Work and work Statistician. Reproductive health. Screening  You may have the following tests or measurements: Height, weight, and BMI. Blood pressure. Lipid and cholesterol levels. These may be checked every 5 years, or more frequently if you are over 90 years old. Skin check. Lung cancer screening. You may have this screening every year starting at age 78 if you have a 30-pack-year history of smoking and currently smoke or have quit within the past 15 years. Fecal occult blood test (FOBT) of the stool. You may have this test every year starting at age 91. Flexible sigmoidoscopy or colonoscopy. You may have a sigmoidoscopy every 5 years or a colonoscopy every 10 years starting at age 31. Hepatitis C blood test. Hepatitis B blood test. Sexually transmitted disease (STD) testing. Diabetes screening. This is done by checking your blood sugar (glucose) after you have not eaten for a while (fasting). You may have this done every 1-3 years. Bone density scan. This is done to screen for osteoporosis. You  may have this done starting at age 25. Mammogram. This may be done every 1-2 years. Talk to your health care provider about how often you should have regular mammograms. Talk with your health care provider about your test results, treatment options, and if necessary, the need for more tests. Vaccines  Your health care provider may recommend certain vaccines, such as: Influenza vaccine. This is  recommended every year. Tetanus, diphtheria, and acellular pertussis (Tdap, Td) vaccine. You may need a Td booster every 10 years. Zoster vaccine. You may need this after age 67. Pneumococcal 13-valent conjugate (PCV13) vaccine. One dose is recommended after age 37. Pneumococcal polysaccharide (PPSV23) vaccine. One dose is recommended after age 1. Talk to your health care provider about which screenings and vaccines you need and how often you need them. This information is not intended to replace advice given to you by your health care provider. Make sure you discuss any questions you have with your health care provider. Document Released: 09/09/2015 Document Revised: 05/02/2016 Document Reviewed: 06/14/2015 Elsevier Interactive Patient Education  2017 [employer] Prevention in the Home Falls can cause injuries. They can happen to people of all ages. There are many things you can do to make your home safe and to help prevent falls. What can I do on the outside of my home? Regularly fix the edges of walkways and driveways and fix any cracks. Remove anything that might make you trip as you walk through a door, such as a raised step or threshold. Trim any bushes or trees on the path to your home. Use bright outdoor lighting. Clear any walking paths of anything that might make someone trip, such as rocks or tools. Regularly check to see if handrails are loose or broken. Make sure that both sides of any steps have handrails. Any raised decks and porches should have guardrails on the edges. Have any leaves, snow, or ice cleared regularly. Use sand or salt on walking paths during winter. Clean up any spills in your garage right away. This includes oil or grease spills. What can I do in the bathroom? Use night lights. Install grab bars by the toilet and in the tub and shower. Do not use towel bars as grab bars. Use non-skid mats or decals in the tub or shower. If you need to sit down in  the shower, use a plastic, non-slip stool. Keep the floor dry. Clean up any water that spills on the floor as soon as it happens. Remove soap buildup in the tub or shower regularly. Attach bath mats securely with double-sided non-slip rug tape. Do not have throw rugs and other things on the floor that can make you trip. What can I do in the bedroom? Use night lights. Make sure that you have a light by your bed that is easy to reach. Do not use any sheets or blankets that are too big for your bed. They should not hang down onto the floor. Have a firm chair that has side arms. You can use this for support while you get dressed. Do not have throw rugs and other things on the floor that can make you trip. What can I do in the kitchen? Clean up any spills right away. Avoid walking on wet floors. Keep items that you use a lot in easy-to-reach places. If you need to reach something above you, use a strong step stool that has a grab bar. Keep electrical cords out of the way. Do not use floor  polish or wax that makes floors slippery. If you must use wax, use non-skid floor wax. Do not have throw rugs and other things on the floor that can make you trip. What can I do with my stairs? Do not leave any items on the stairs. Make sure that there are handrails on both sides of the stairs and use them. Fix handrails that are broken or loose. Make sure that handrails are as long as the stairways. Check any carpeting to make sure that it is firmly attached to the stairs. Fix any carpet that is loose or worn. Avoid having throw rugs at the top or bottom of the stairs. If you do have throw rugs, attach them to the floor with carpet tape. Make sure that you have a light switch at the top of the stairs and the bottom of the stairs. If you do not have them, ask someone to add them for you. What else can I do to help prevent falls? Wear shoes that: Do not have high heels. Have rubber bottoms. Are comfortable  and fit you well. Are closed at the toe. Do not wear sandals. If you use a stepladder: Make sure that it is fully opened. Do not climb a closed stepladder. Make sure that both sides of the stepladder are locked into place. Ask someone to hold it for you, if possible. Clearly mark and make sure that you can see: Any grab bars or handrails. First and last steps. Where the edge of each step is. Use tools that help you move around (mobility aids) if they are needed. These include: Canes. Walkers. Scooters. Crutches. Turn on the lights when you go into a dark area. Replace any light bulbs as soon as they burn out. Set up your furniture so you have a clear path. Avoid moving your furniture around. If any of your floors are uneven, fix them. If there are any pets around you, be aware of where they are. Review your medicines with your doctor. Some medicines can make you feel dizzy. This can increase your chance of falling. Ask your doctor what other things that you can do to help prevent falls. This information is not intended to replace advice given to you by your health care provider. Make sure you discuss any questions you have with your health care provider. Document Released: 06/09/2009 Document Revised: 01/19/2016 Document Reviewed: 09/17/2014 Elsevier Interactive Patient Education  2017 [employer].

## 2021-04-13 NOTE — Progress Notes (Signed)
Subjective:   Betty [name] is a 75 y.o. female who presents for Medicare Annual (Subsequent) preventive examination.  Review of Systems    No ROS.  Medicare Wellness Virtual Visit.  Visual/audio telehealth visit, UTA vital signs.   See social history for additional risk factors.   Cardiac Risk Factors include: advanced age (>64mn, >>77women);diabetes mellitus;sedentary lifestyle     Objective:    Today's Vitals   04/13/21 0948  Weight: 204 lb (92.5 kg)  Height: '5\' 5"'$  (1.651 m)   Body mass index is 33.95 kg/m.  Advanced Directives 04/13/2021 07/12/2020 06/21/2020 04/12/2020 03/22/2020 04/10/2019 04/08/2018  Does Patient Have a Medical Advance Directive? No - No No No No No  Would patient like information on creating a medical advance directive? No - Patient declined No - Patient declined No - Patient declined No - Patient declined No - Patient declined No - Patient declined No - Patient declined    Current Medications (verified) Outpatient Encounter Medications as of 04/13/2021  Medication Sig   acetic acid 2 % otic solution Insert saturated wick of cotton; keep moist 24 hours by adding 3 to 5 drops every 4 to 6 hours; remove wick after 24 hours and instill 5 drops 3 to 4 times daily   albuterol (VENTOLIN HFA) 108 (90 Base) MCG/ACT inhaler Inhale 2 puffs into the lungs every 4 (four) hours as needed for wheezing or shortness of breath.   aspirin 81 MG EC tablet Take 1 tablet (81 mg total) by mouth daily.   CALCIUM-MAGNESIUM-VITAMIN D PO Take 1 tablet by mouth daily.   carvedilol (COREG) 12.5 MG tablet TAKE 1 TABLET BY MOUTH TWICE A DAY WITH MEALS   Coenzyme Q10 (CO Q 10 PO) Take 200 mg by mouth at bedtime.    Colchicine (MITIGARE) 0.6 MG CAPS Day 1: take 1.2 mg (2 tablets) by mouth at the first sign of flare, followed by 0.6 mg (1 tablet) by mouth after 1 hour. Day 2 and thereafter: take 0.6 mg (1 tablet) by mouth daily until flare has resolved.   Fish Oil OIL Take 1,200 mg by  mouth 2 (two) times daily. GUMMIES   losartan (COZAAR) 50 MG tablet TAKE 1/2 TABLET BY MOUTH DAILY   metFORMIN (GLUCOPHAGE) 500 MG tablet Take 1,000 mg by mouth daily with breakfast.   multivitamin (THERAGRAN) per tablet Take 1 tablet by mouth daily.   nystatin cream (MYCOSTATIN) Apply 1 application topically 2 (two) times daily.   polyvinyl alcohol (LIQUIFILM TEARS) 1.4 % ophthalmic solution Place 1 drop into both eyes every morning.   pravastatin (PRAVACHOL) 20 MG tablet Take 1 tablet (20 mg total) by mouth daily.   No facility-administered encounter medications on file as of 04/13/2021.    Allergies (verified) Amoxicillin, Etodolac, Hydrochlorothiazide, Meloxicam, Sulfa antibiotics, Allopurinol, Metformin and related, Rosuvastatin, Sulfonamide derivatives, Jardiance [empagliflozin], and Lasix [furosemide]   History: Past Medical History:  Diagnosis Date   Allergic rhinitis    never tested, fall and spring   Arthritis    hands,knees, feet   Cataracts, bilateral    not surgical yet   CHOLECYSTECTOMY, HX OF 10/08/2007   Qualifier: Diagnosis of  By: SCouncil MechanicMD, RHilaria Ota   Chronic diastolic CHF (congestive heart failure) (HCC)    [place]ronic respiratory failure ([place]    Diabetes mellitus without complication ([place]    Diverticulosis    problems with frequent gas, burping   Glucose intolerance (impaired glucose tolerance)    Gout    History  of chicken pox    Hyperlipidemia    Hypertension    NICM (nonischemic cardiomyopathy) ([place]) 2002   EF 25%; improved to normal - echo 4/08: EF 50-55%, mild MR, mild LAE, mild TR;    cath 3/03: normal cors, EF 40%   Obesity    Oxygen deficiency 2017   at night   PONV (postoperative nausea and vomiting)    after wisdom tooth extraction   Restrictive lung disease    Skin cancer    skin cancers only   Sleep apnea    cpap settings 4   Past Surgical History:  Procedure Laterality Date   BILATERAL VATS ABLATION Right    biopsy done 2015- [facility]    BREAST EXCISIONAL BIOPSY Left 80's   NEG   CARDIAC CATHETERIZATION     CATARACT EXTRACTION W/PHACO Right 06/21/2020   Procedure: CATARACT EXTRACTION PHACO AND INTRAOCULAR LENS PLACEMENT ([place]) RIGHT DIABETIC;  Surgeon: Birder Robson, MD;  Location: [facility];  Service: Ophthalmology;  Laterality: Right;  4.06 0:32.8   CATARACT EXTRACTION W/PHACO Left 07/12/2020   Procedure: CATARACT EXTRACTION PHACO AND INTRAOCULAR LENS PLACEMENT ([facility]) LEFT DIABETIC 6.96 00:46.3;  Surgeon: Birder Robson, MD;  Location: [place];  Service: Ophthalmology;  Laterality: Left;  Diabetic - oral meds   CHOLECYSTECTOMY     choleystectomy  02/2002   COLONOSCOPY WITH PROPOFOL N/A 12/20/2014   Procedure: COLONOSCOPY WITH PROPOFOL;  Surgeon: [name], MD;  Location: [facility];  Service: Gastroenterology;  Laterality: N/A;   COLONOSCOPY WITH PROPOFOL N/A 03/22/2020   Procedure: COLONOSCOPY WITH PROPOFOL;  Surgeon: [name], MD;  Location: [facility];  Service: Gastroenterology;  Laterality: N/A;   CYSTECTOMY  1996   l breast   DILATION AND CURETTAGE OF UTERUS  2011   Dr. Hulan Fray at [place]     x 2   POLYPECTOMY  03/22/2020   Procedure: POLYPECTOMY;  Surgeon: [name], MD;  Location: [facility];  Service: Gastroenterology;;   [place]     x2   Family History  Problem Relation Age of Onset   Lymphoma Mother 24   COPD Father        was a smoker   Stroke Father    Pneumonia Father    Diabetes Father    Hyperlipidemia Father    Heart disease Father    Heart attack Father    Hypertension Father    Diabetes Sister    Depression Sister    Meniere's disease Brother    Diabetes Paternal Grandfather    Heart disease Paternal Grandfather    Schizophrenia Daughter    Bladder Cancer Maternal Grandmother    Leukemia Maternal Grandfather    Hypertension Brother    Colon cancer Neg Hx    Stomach cancer  Neg Hx    Breast cancer Neg Hx    Colon polyps Neg Hx    Esophageal cancer Neg Hx    Rectal cancer Neg Hx    Social History   Socioeconomic History   Marital status: Married    Spouse name: Not on file   Number of children: 2   Years of education: Not on file   Highest education level: Not on file  Occupational History   Occupation: Retired Product manager: retired   Occupation: Works at [employer] Use   Smoking status: Never   Smokeless  tobacco: Never  Vaping Use   Vaping Use: Never used  Substance and Sexual Activity   Alcohol use: No    Alcohol/week: 0.0 standard drinks   Drug use: No   Sexual activity: Not Currently  Other Topics Concern   Not on file  Social History Narrative   Lives in [place] with husband. 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 No pets.   Social Determinants of Health   Financial Resource Strain: Low Risk    Difficulty of Paying Living Expenses: Not hard at all  Food Insecurity: No Food Insecurity   Worried About Charity fundraiser in the Last Year: Never true   [place] in the Last Year: Never true  Transportation Needs: No Transportation Needs   Lack of Transportation (Medical): No   Lack of Transportation (Non-Medical): No  Physical Activity: Not on file  Stress: No Stress Concern Present   Feeling of Stress : Not at all  Social Connections: Socially Integrated   Frequency of Communication with Friends and Family: More than three times a week   Frequency of Social Gatherings with Friends and Family: More than three times a week   Attends Religious Services: More than 4 times per year   Active Member of [employer] or Organizations: Yes   Attends Music therapist: More than 4 times per year   Marital Status: Married    Tobacco Counseling Counseling given: Not Answered   Clinical Intake:  Pre-visit preparation completed: Yes Nutrition Risk Assessment: Has the patient had any N/V/D  within the last 2 months?  No Does the patient have any non-healing wounds?  No Has the patient had any unintentional weight loss or weight gain?  No   Diabetes: If diabetic, was a CBG obtained today?  No  Did the patient bring in their glucometer from home?  No  How often do you monitor your CBG's? Does not monitor at home.   Financial Strains and Diabetes Management: Are you having any financial strains with the device, your supplies or your medication? No .  Does the patient want to be seen by Chronic Care Management for management of their diabetes?  No  Would the patient like to be referred to a Nutritionist or for Diabetic Management?  No      Diabetes: Yes (Followed by pcp)  How often do you need to have someone help you when you read instructions, pamphlets, or other written materials from your doctor or pharmacy?: 1 - Never    Interpreter Needed?: No      Activities of Daily Living In your present state of health, do you have any difficulty performing the following activities: 04/13/2021 07/12/2020  Hearing? N N  Vision? N N  Difficulty concentrating or making decisions? N N  Walking or climbing stairs? Y N  Comment SOBOE. Paces self. 4L Oxygen worn at bedtime. -  Dressing or bathing? N N  Doing errands, shopping? N -  Preparing Food and eating ? N -  Using the Toilet? N -  In the past six months, have you accidently leaked urine? N -  Do you have problems with loss of bowel control? N -  Managing your Medications? N -  Managing your Finances? N -  Housekeeping or managing your Housekeeping? Y -  Comment Family assist -  Some recent data might be hidden    Patient Care Team: Leone Haven, MD as PCP - General (Family Medicine) [name],  MD as PCP - Sleep Medicine (Sleep Medicine) [name], MD as PCP - Cardiology (Cardiology) Dasher, Rayvon Char, MD (Dermatology) Juanito Doom, MD as Referring Physician (Internal Medicine)  Indicate any  recent Medical Services you may have received from other than [facility] providers in the past year (date may be approximate).     Assessment:   This is a routine wellness examination for Shali.  I connected with Tineisha today by telephone and verified that I am speaking with the correct person using two identifiers. Location patient: home Location provider: work Persons participating in the virtual visit: patient, Marine scientist.    I discussed the limitations, risks, security and privacy concerns of performing an evaluation and management service by telephone and the availability of in person appointments. The patient expressed understanding and verbally consented to this telephonic visit.    Interactive audio and video telecommunications were attempted between this provider and patient, however failed, due to patient having technical difficulties OR patient did not have access to video capability.  We continued and completed visit with audio only.  Some vital signs may be absent or patient reported.   Hearing/Vision screen Hearing Screening - Comments:: Patient is able to hear conversational tones without difficulty.  No issues reported. Vision Screening - Comments:: Wears reader lenses Cataract extraction, bilateral They have seen their ophthalmologist in the last 12 months.    Dietary issues and exercise activities discussed: Current Exercise Habits: The patient does not participate in regular exercise at present Regular diet Good water intake   Goals Addressed             This Visit's Progress    Healthy Lifestyle       Stay active; chair exercises Healthy diet           Depression Screen PHQ 2/9 Scores 04/13/2021 11/14/2020 04/20/2020 04/12/2020 01/15/2020 10/14/2019 04/10/2019  PHQ - 2 Score 0 0 0 0 0 0 0  PHQ- 9 Score - - - - - - -    Fall Risk Fall Risk  04/13/2021 11/14/2020 04/20/2020 01/15/2020 10/14/2019  Falls in the past year? 0 0 1 0 0  Number falls in past yr: 0 0 0 0 0   Injury with Fall? 0 - 0 - -  Follow up Falls evaluation completed Falls evaluation completed Falls evaluation completed Falls evaluation completed Falls evaluation completed    [place]: Adequate lighting in your home to reduce risk of falls? Yes   TIMED UP AND GO: Was the test performed? No .   Cognitive Function: Patient is alert and oriented x3.  Enjoys brain health games/activities.  MMSE/6CIT deferred. Normal by direct communication/observation.  MMSE - Mini Mental State Exam 04/08/2018 03/28/2017 03/28/2016  Orientation to time '5 5 5  '$ Orientation to Place '5 5 5  '$ Registration '3 3 3  '$ Attention/ Calculation '5 5 5  '$ Recall '3 3 3  '$ Language- name 2 objects '2 2 2  '$ Language- repeat '1 1 1  '$ Language- follow 3 step command '3 3 3  '$ Language- read & follow direction '1 1 1  '$ Write a sentence '1 1 1  '$ Copy design '1 1 1  '$ Total score '30 30 30     '$ 6CIT Screen 04/12/2020 04/10/2019  What Year? 0 points 0 points  What month? 0 points 0 points  What time? - 0 points  Count back from 20 - 0 points  Months in reverse 0 points 0 points  Repeat phrase  0 points 0 points  Total Score - 0    Immunizations Immunization History  Administered Date(s) Administered   Fluad Quad(high Dose 65+) 04/30/2019   H1N1 09/08/2008   Influenza Split 06/28/2011, 05/01/2012   Influenza Whole 07/11/2007, 06/07/2008, 07/12/2009   Influenza, High Dose Seasonal PF 05/12/2018, 05/19/2020   Influenza,inj,Quad PF,6+ Mos 04/23/2014, 04/21/2015   Influenza-Unspecified 04/27/2013, 05/04/2016, 04/24/2017   PFIZER(Purple Top)SARS-COV-2 Vaccination 09/10/2019, 10/01/2019, 05/30/2020, 12/23/2020   Pneumococcal Conjugate-13 01/21/2014   Pneumococcal Polysaccharide-23 08/28/2011, 03/06/2012   Td 10/03/2007, 10/14/2013   Tdap 10/11/2013   Zoster Recombinat (Shingrix) 04/13/2019, 05/18/2019, 07/16/2019   Zoster, Live 06/21/2010    Health Maintenance There are no preventive care reminders to  display for this patient. Health Maintenance  Topic Date Due   INFLUENZA VACCINE  05/30/2021 (Originally 03/27/2021)   COVID-19 Vaccine (5 - Booster for Pfizer series) 04/24/2021   HEMOGLOBIN A1C  05/17/2021   OPHTHALMOLOGY EXAM  05/19/2021   FOOT EXAM  04/13/2022   MAMMOGRAM  03/08/2023   TETANUS/TDAP  10/15/2023   COLONOSCOPY (Pts 45-36yr Insurance coverage will need to be confirmed)  03/22/2025   DEXA SCAN  Completed   Hepatitis C Screening  Completed   PNA vac Low Risk Adult  Completed   Zoster Vaccines- Shingrix  Completed   HPV VACCINES  Aged Out   Vision Screening: Recommended annual ophthalmology exams for early detection of glaucoma and other disorders of the eye.  Dental Screening: Recommended annual dental exams for proper oral hygiene  Community Resource Referral / Chronic Care Management: CRR required this visit?  No   CCM required this visit?  No      Plan:     I have personally reviewed and noted the following in the patient's chart:   Medical and social history Use of alcohol, tobacco or illicit drugs  Current medications and supplements including opioid prescriptions. Not taking opioid.  Functional ability and status Nutritional status Physical activity Advanced directives List of other physicians Hospitalizations, surgeries, and ER visits in previous 12 months Vitals Screenings to include cognitive, depression, and falls Referrals and appointments  In addition, I have reviewed and discussed with patient certain preventive protocols, quality metrics, and best practice recommendations. A written personalized care plan for preventive services as well as general preventive health recommendations were provided to patient via mychart.     OVarney Biles LPN   [id number]

## 2021-05-04 ENCOUNTER — Other Ambulatory Visit: Payer: Self-pay

## 2021-05-04 ENCOUNTER — Encounter: Payer: Medicare PPO | Attending: Emergency Medicine | Admitting: *Deleted

## 2021-05-04 DIAGNOSIS — J984 Other disorders of lung: Secondary | ICD-10-CM | POA: Insufficient documentation

## 2021-05-04 DIAGNOSIS — Z79899 Other long term (current) drug therapy: Secondary | ICD-10-CM | POA: Insufficient documentation

## 2021-05-04 DIAGNOSIS — J9610 Chronic respiratory failure, unspecified whether with hypoxia or hypercapnia: Secondary | ICD-10-CM | POA: Diagnosis not present

## 2021-05-04 NOTE — Progress Notes (Signed)
Virtual orientation call completed today. shehas an appointment on Date: 05/18/2021  for EP eval and gym Orientation.  Documentation of diagnosis can be found in [facility] Date:   .  02/07/2021

## 2021-05-11 DIAGNOSIS — L570 Actinic keratosis: Secondary | ICD-10-CM | POA: Diagnosis not present

## 2021-05-11 DIAGNOSIS — D2271 Melanocytic nevi of right lower limb, including hip: Secondary | ICD-10-CM | POA: Diagnosis not present

## 2021-05-11 DIAGNOSIS — D2262 Melanocytic nevi of left upper limb, including shoulder: Secondary | ICD-10-CM | POA: Diagnosis not present

## 2021-05-11 DIAGNOSIS — Z85828 Personal history of other malignant neoplasm of skin: Secondary | ICD-10-CM | POA: Diagnosis not present

## 2021-05-11 DIAGNOSIS — D485 Neoplasm of uncertain behavior of skin: Secondary | ICD-10-CM | POA: Diagnosis not present

## 2021-05-11 DIAGNOSIS — D2261 Melanocytic nevi of right upper limb, including shoulder: Secondary | ICD-10-CM | POA: Diagnosis not present

## 2021-05-17 ENCOUNTER — Ambulatory Visit: Payer: Medicare PPO | Admitting: Family Medicine

## 2021-05-17 ENCOUNTER — Encounter: Payer: Self-pay | Admitting: Family Medicine

## 2021-05-17 ENCOUNTER — Other Ambulatory Visit: Payer: Self-pay

## 2021-05-17 VITALS — BP 110/70 | HR 90 | Temp 98.5°F | Ht 65.0 in | Wt 203.0 lb

## 2021-05-17 DIAGNOSIS — J301 Allergic rhinitis due to pollen: Secondary | ICD-10-CM | POA: Diagnosis not present

## 2021-05-17 DIAGNOSIS — E1121 Type 2 diabetes mellitus with diabetic nephropathy: Secondary | ICD-10-CM | POA: Diagnosis not present

## 2021-05-17 DIAGNOSIS — I5032 Chronic diastolic (congestive) heart failure: Secondary | ICD-10-CM

## 2021-05-17 DIAGNOSIS — J9611 Chronic respiratory failure with hypoxia: Secondary | ICD-10-CM | POA: Diagnosis not present

## 2021-05-17 DIAGNOSIS — Z23 Encounter for immunization: Secondary | ICD-10-CM

## 2021-05-17 DIAGNOSIS — I872 Venous insufficiency (chronic) (peripheral): Secondary | ICD-10-CM | POA: Insufficient documentation

## 2021-05-17 DIAGNOSIS — E7800 Pure hypercholesterolemia, unspecified: Secondary | ICD-10-CM | POA: Diagnosis not present

## 2021-05-17 LAB — BASIC METABOLIC PANEL
BUN: 19 mg/dL (ref 6–23)
CO2: 33 mEq/L — ABNORMAL HIGH (ref 19–32)
Calcium: 9.4 mg/dL (ref 8.4–10.5)
Chloride: 98 mEq/L (ref 96–112)
Creatinine, Ser: 0.63 mg/dL (ref 0.40–1.20)
GFR: 87.48 mL/min (ref >60.00)
Glucose, Bld: 156 mg/dL — ABNORMAL HIGH (ref 70–99)
Potassium: 4.2 mEq/L (ref 3.5–5.1)
Sodium: 139 mEq/L (ref 135–145)

## 2021-05-17 LAB — HEMOGLOBIN A1C: Hgb A1c MFr Bld: 7.2 % — ABNORMAL HIGH (ref 4.6–6.5)

## 2021-05-17 MED ORDER — FLUTICASONE PROPIONATE 50 MCG/ACT NA SUSP: 2.0000 | Freq: Every day | NASAL | 6 refills | Status: DC

## 2021-05-17 MED ORDER — METFORMIN HCL 500 MG PO TABS: 1000.0000 mg | ORAL_TABLET | Freq: Every day | ORAL | 2 refills | Status: DC

## 2021-05-17 NOTE — Assessment & Plan Note (Signed)
We will add on Flonase.  She can try Claritin if the Flonase is not beneficial.

## 2021-05-17 NOTE — Assessment & Plan Note (Signed)
Poor compliance with her oxygen.  Discussed that she needs to wear her oxygen as advised.

## 2021-05-17 NOTE — Assessment & Plan Note (Signed)
Check A1c.  Continue metformin 1000 mg once daily.

## 2021-05-17 NOTE — Assessment & Plan Note (Signed)
Generally stable.  She will continue losartan 25 mg daily and carvedilol 12.5 mg twice daily.

## 2021-05-17 NOTE — Patient Instructions (Signed)
Nice to see you. You need to consistently wear your oxygen. We will get lab work today and contact you with the results. Please try the Flonase for your chronic postnasal drip.

## 2021-05-17 NOTE — Progress Notes (Signed)
Tommi Rumps, MD Phone: [phone]  Betty [name] is a 75 y.o. female who presents today for f/u.  Diastolic heart failure Disease Monitoring: Blood pressure range-not checking Chest pain- no      Dyspnea- no change to chronic dyspnea Medications: Compliance- taking coreg, losartan    Edema- chronic and stable L>R  DIABETES Disease Monitoring: Blood Sugar ranges-not checking Polyuria/phagia/dipsia- no      Optho- UTD Medications: Compliance- taking metformin 1000 mg daily Hypoglycemic symptoms- no  HYPERLIPIDEMIA Disease Monitoring: See symptoms for Hypertension Medications: Compliance- taking pravastatin Right upper quadrant pain- no  Muscle aches- no  Venous insufficiency: Patient notes left lower extremity swelling greater than right lower extremity swelling.  This has been a chronic issue over the past 3 years.  She notes no significant resolution overnight.  She does have compression stockings which she does not wear during the summer.  She does note some color change to her lower legs.  Allergic rhinitis: Patient notes chronic rhinorrhea and postnasal drip.  She did take some Claritin last night and that helped dry things up a little bit.  Seldom sneezes.  No congestion.  She has usedin the past though not currently.  Social History   Tobacco Use  Smoking Status Never  Smokeless Tobacco Never    Current Outpatient Medications on File Prior to Visit  Medication Sig Dispense Refill   acetic acid 2 % otic solution Insert saturated wick of cotton; keep moist 24 hours by adding 3 to 5 drops every 4 to 6 hours; remove wick after 24 hours and instill 5 drops 3 to 4 times daily 15 mL 0   albuterol (VENTOLIN HFA) 108 (90 Base) MCG/ACT inhaler Inhale 2 puffs into the lungs every 4 (four) hours as needed for wheezing or shortness of breath. 8 g 6   aspirin 81 MG EC tablet Take 1 tablet (81 mg total) by mouth daily. 90 tablet 2   CALCIUM-MAGNESIUM-VITAMIN D PO Take 1 tablet  by mouth daily.     carvedilol (COREG) 12.5 MG tablet TAKE 1 TABLET BY MOUTH TWICE A DAY WITH MEALS 60 tablet 11   clobetasol (TEMOVATE) 0.05 % GEL Apply topically 2 (two) times daily.     Coenzyme Q10 (CO Q 10 PO) Take 200 mg by mouth at bedtime.      Colchicine (MITIGARE) 0.6 MG CAPS Day 1: take 1.2 mg (2 tablets) by mouth at the first sign of flare, followed by 0.6 mg (1 tablet) by mouth after 1 hour. Day 2 and thereafter: take 0.6 mg (1 tablet) by mouth daily until flare has resolved. 30 capsule 0   Fish Oil OIL Take 1,200 mg by mouth 2 (two) times daily. GUMMIES     losartan (COZAAR) 50 MG tablet TAKE 1/2 TABLET BY MOUTH DAILY 45 tablet 3   multivitamin (THERAGRAN) per tablet Take 1 tablet by mouth daily.     nystatin cream (MYCOSTATIN) Apply 1 application topically 2 (two) times daily. 30 g 0   polyvinyl alcohol (LIQUIFILM TEARS) 1.4 % ophthalmic solution Place 1 drop into both eyes every morning.     pravastatin (PRAVACHOL) 20 MG tablet Take 1 tablet (20 mg total) by mouth daily. 90 tablet 1   No current facility-administered medications on file prior to visit.     ROS see history of present illness  Objective  Physical Exam Vitals:   05/17/21 1016  BP: 110/70  Pulse: 90  Temp: 98.5 F (36.9 C)  SpO2: 91%  BP Readings from Last 3 Encounters:  05/17/21 110/70  03/23/21 139/83  02/07/21 112/74   Wt Readings from Last 3 Encounters:  05/17/21 203 lb (92.1 kg)  04/13/21 204 lb (92.5 kg)  03/23/21 204 lb 9.6 oz (92.8 kg)    Physical Exam Constitutional:      General: She is not in acute distress.    Appearance: She is not diaphoretic.  Cardiovascular:     Rate and Rhythm: Normal rate and regular rhythm.     Heart sounds: Normal heart sounds.  Pulmonary:     Effort: Pulmonary effort is normal.     Breath sounds: Normal breath sounds.  Musculoskeletal:     Comments: 1+ pitting edema left lower extremity slightly greater than right lower extremity, there are venous  stasis skin changes in her bilateral lower extremities above her ankles and below the knee  Skin:    General: Skin is warm and dry.  Neurological:     Mental Status: She is alert.     Assessment/Plan: Please see individual problem list.  Problem List Items Addressed This Visit     Chronic diastolic heart failure (HCC) (Chronic)    Generally stable.  She will continue losartan 25 mg daily and carvedilol 12.5 mg twice daily.      Relevant Orders   Basic Metabolic Panel (BMET)   Diabetes mellitus type 2, controlled ([place]) (Chronic)    Check A1c.  Continue metformin 1000 mg once daily.      Relevant Medications   metFORMIN (GLUCOPHAGE) 500 MG tablet   Other Relevant Orders   HgB L3Y   Basic Metabolic Panel (BMET)   ALLERGIC RHINITIS, SEASONAL    We will add on Flonase.  She can try Claritin if the Flonase is not beneficial.      Chronic hypoxemic respiratory failure (HCC) - Primary    Poor compliance with her oxygen.  Discussed that she needs to wear her oxygen as advised.      Elevated LDL cholesterol level    She is tolerating pravastatin quite well.  She will continue pravastatin 20 mg once daily.      Venous insufficiency    The swelling in her legs appears to be related to chronic venous insufficiency.  I discussed elevating her legs is much as she is able to and also wearing her compression stockings.      Other Visit Diagnoses     Need for immunization against influenza       Relevant Orders   Flu Vaccine QUAD High Dose(Fluad) (Completed)      Return in about 6 months (around 11/14/2021).  This visit occurred during the SARS-CoV-2 public health emergency.  Safety protocols were in place, including screening questions prior to the visit, additional usage of staff PPE, and extensive cleaning of exam room while observing appropriate contact time as indicated for disinfecting solutions.    Tommi Rumps, MD [place]

## 2021-05-17 NOTE — Assessment & Plan Note (Signed)
The swelling in her legs appears to be related to chronic venous insufficiency.  I discussed elevating her legs is much as she is able to and also wearing her compression stockings.

## 2021-05-17 NOTE — Assessment & Plan Note (Signed)
She is tolerating pravastatin quite well.  She will continue pravastatin 20 mg once daily.

## 2021-05-18 ENCOUNTER — Other Ambulatory Visit: Payer: Self-pay | Admitting: Family Medicine

## 2021-05-18 VITALS — Ht 65.5 in | Wt 203.2 lb

## 2021-05-18 DIAGNOSIS — J984 Other disorders of lung: Secondary | ICD-10-CM

## 2021-05-18 DIAGNOSIS — Z79899 Other long term (current) drug therapy: Secondary | ICD-10-CM | POA: Diagnosis not present

## 2021-05-18 NOTE — Progress Notes (Signed)
Pulmonary Individual Treatment Plan  Patient Details  Name: Betty [name] MRN: [id number] Date of Birth: 1946/07/06 Referring Provider:   April Manson Pulmonary Rehab from 05/18/2021 in [facility] Cardiac and Pulmonary Rehab  Referring Provider [name]       Initial Encounter Date:  Flowsheet Row Pulmonary Rehab from 05/18/2021 in [facility] Cardiac and Pulmonary Rehab  Date 05/18/21       Visit Diagnosis: Restrictive lung disease  Patient's Home Medications on Admission:  Current Outpatient Medications:    acetic acid 2 % otic solution, Insert saturated wick of cotton; keep moist 24 hours by adding 3 to 5 drops every 4 to 6 hours; remove wick after 24 hours and instill 5 drops 3 to 4 times daily, Disp: 15 mL, Rfl: 0   albuterol (VENTOLIN HFA) 108 (90 Base) MCG/ACT inhaler, Inhale 2 puffs into the lungs every 4 (four) hours as needed for wheezing or shortness of breath., Disp: 8 g, Rfl: 6   aspirin 81 MG EC tablet, Take 1 tablet (81 mg total) by mouth daily., Disp: 90 tablet, Rfl: 2   CALCIUM-MAGNESIUM-VITAMIN D PO, Take 1 tablet by mouth daily., Disp: , Rfl:    carvedilol (COREG) 12.5 MG tablet, TAKE 1 TABLET BY MOUTH TWICE A DAY WITH MEALS, Disp: 60 tablet, Rfl: 11   clobetasol (TEMOVATE) 0.05 % GEL, Apply topically 2 (two) times daily., Disp: , Rfl:    Coenzyme Q10 (CO Q 10 PO), Take 200 mg by mouth at bedtime. , Disp: , Rfl:    Colchicine (MITIGARE) 0.6 MG CAPS, Day 1: take 1.2 mg (2 tablets) by mouth at the first sign of flare, followed by 0.6 mg (1 tablet) by mouth after 1 hour. Day 2 and thereafter: take 0.6 mg (1 tablet) by mouth daily until flare has resolved., Disp: 30 capsule, Rfl: 0   Fish Oil OIL, Take 1,200 mg by mouth 2 (two) times daily. GUMMIES, Disp: , Rfl:    fluticasone (FLONASE) 50 MCG/ACT nasal spray, Place 2 sprays into both nostrils daily., Disp: 16 g, Rfl: 6   losartan (COZAAR) 50 MG tablet, TAKE 1/2 TABLET BY MOUTH DAILY, Disp: 45 tablet, Rfl: 3   metFORMIN  (GLUCOPHAGE) 500 MG tablet, Take 2 tablets (1,000 mg total) by mouth daily with breakfast., Disp: 180 tablet, Rfl: 2   multivitamin (THERAGRAN) per tablet, Take 1 tablet by mouth daily., Disp: , Rfl:    nystatin cream (MYCOSTATIN), Apply 1 application topically 2 (two) times daily., Disp: 30 g, Rfl: 0   polyvinyl alcohol (LIQUIFILM TEARS) 1.4 % ophthalmic solution, Place 1 drop into both eyes every morning., Disp: , Rfl:    pravastatin (PRAVACHOL) 20 MG tablet, Take 1 tablet (20 mg total) by mouth daily., Disp: 90 tablet, Rfl: 1  Past Medical History: Past Medical History:  Diagnosis Date   Allergic rhinitis    never tested, fall and spring   Arthritis    hands,knees, feet   Cataracts, bilateral    not surgical yet   CHOLECYSTECTOMY, HX OF 10/08/2007   Qualifier: Diagnosis of  By: Council Mechanic MD, [name]    Chronic diastolic CHF (congestive heart failure) (HCC)    Chronic respiratory failure ([place])    Diabetes mellitus without complication ([place])    Diverticulosis    problems with frequent gas, burping   Glucose intolerance (impaired glucose tolerance)    Gout    History of chicken pox    Hyperlipidemia    Hypertension    NICM (nonischemic cardiomyopathy) ([place]) 2002  EF 25%; improved to normal - echo 4/08: EF 50-55%, mild MR, mild LAE, mild TR;    cath 3/03: normal cors, EF 40%   Obesity    Oxygen deficiency 2017   at night   PONV (postoperative nausea and vomiting)    after wisdom tooth extraction   Restrictive lung disease    Skin cancer    skin cancers only   Sleep apnea    cpap settings 4    Tobacco Use: Social History   Tobacco Use  Smoking Status Never  Smokeless Tobacco Never    Labs: Recent Review Flowsheet Data     Labs for ITP Cardiac and Pulmonary Rehab Latest Ref Rng & Units 11/14/2020 12/26/2020 02/21/2021 04/11/2021 05/17/2021   Cholestrol 0 - 200 mg/dL - - - - -   LDLCALC 0 - 99 mg/dL - - - - -   LDLDIRECT mg/dL 114.0 86.0 79.0 63.0 -   HDL >39.00 mg/dL  - - - - -   Trlycerides 0.0 - 149.0 mg/dL - - - - -   Hemoglobin A1c 4.6 - 6.5 % 6.9(H) - - - 7.2(H)   HCO3 20.0 - 24.0 mEq/L - - - - -   TCO2 0 - 100 mmol/L - - - - -   O2SAT % - - - - -        Pulmonary Assessment Scores:  Pulmonary Assessment Scores     Row Name 05/18/21 1557         ADL UCSD   ADL Phase Entry     SOB Score total 51     Rest 0     Walk 2     Stairs 2     Bath 1     Dress 1     Shop 3           CAT Score   CAT Score 15           mMRC Score   mMRC Score 2              UCSD: Self-administered rating of dyspnea associated with activities of daily living (ADLs) 6-point scale (0 = "not at all" to 5 = "maximal or unable to do because of breathlessness")  Scoring Scores range from 0 to 120.  Minimally important difference is 5 units  CAT: CAT can identify the health impairment of COPD patients and is better correlated with disease progression.  CAT has a scoring range of zero to 40. The CAT score is classified into four groups of low (less than 10), medium (10 - 20), high (21-30) and very high (31-40) based on the impact level of disease on health status. A CAT score over 10 suggests significant symptoms.  A worsening CAT score could be explained by an exacerbation, poor medication adherence, poor inhaler technique, or progression of COPD or comorbid conditions.  CAT MCID is 2 points  mMRC: mMRC (Modified Medical Research Council) Dyspnea Scale is used to assess the degree of baseline functional disability in patients of respiratory disease due to dyspnea. No minimal important difference is established. A decrease in score of 1 point or greater is considered a positive change.   Pulmonary Function Assessment:   Exercise Target Goals: Exercise Program Goal: Individual exercise prescription set using results from initial 6 min walk test and THRR while considering  patient's activity barriers and safety.   Exercise Prescription Goal: Initial  exercise prescription builds to 30-45 minutes a day of aerobic activity,  2-3 days per week.  Home exercise guidelines will be given to patient during program as part of exercise prescription that the participant will acknowledge.  Education: Aerobic Exercise: - Group verbal and visual presentation on the components of exercise prescription. Introduces F.I.T.T principle from ACSM for exercise prescriptions.  Reviews F.I.T.T. principles of aerobic exercise including progression. Written material given at graduation.   Education: Resistance Exercise: - Group verbal and visual presentation on the components of exercise prescription. Introduces F.I.T.T principle from ACSM for exercise prescriptions  Reviews F.I.T.T. principles of resistance exercise including progression. Written material given at graduation.    Education: Exercise & Equipment Safety: - Individual verbal instruction and demonstration of equipment use and safety with use of the equipment. Flowsheet Row Pulmonary Rehab from 05/18/2021 in [facility] Cardiac and Pulmonary Rehab  Date 05/18/21  Educator AS  Instruction Review Code 1- Verbalizes Understanding       Education: Exercise Physiology & General Exercise Guidelines: - Group verbal and written instruction with models to review the exercise physiology of the cardiovascular system and associated critical values. Provides general exercise guidelines with specific guidelines to those with heart or lung disease.    Education: Flexibility, Balance, Mind/Body Relaxation: - Group verbal and visual presentation with interactive activity on the components of exercise prescription. Introduces F.I.T.T principle from ACSM for exercise prescriptions. Reviews F.I.T.T. principles of flexibility and balance exercise training including progression. Also discusses the mind body connection.  Reviews various relaxation techniques to help reduce and manage stress (i.e. Deep breathing, progressive muscle  relaxation, and visualization). Balance handout provided to take home. Written material given at graduation.   Activity Barriers & Risk Stratification:  Activity Barriers & Cardiac Risk Stratification - 05/04/21 1448       Activity Barriers & Cardiac Risk Stratification   Activity Barriers Deconditioning;Other (comment);Shortness of Breath    Comments neuropathy in feet             6 Minute Walk:  6 Minute Walk     Row Name 05/18/21 1540         6 Minute Walk   Phase Initial     Distance 1045 feet     Walk Time 6 minutes     # of Rest Breaks 0     MPH 1.98     METS 2.14     RPE 11     Perceived Dyspnea  2     VO2 Peak 7.5     Symptoms No     Resting HR 90 bpm     Resting BP 118/68     Resting Oxygen Saturation  95 %     Exercise Oxygen Saturation  during 6 min walk 89 %     Max Ex. HR 120 bpm     Max Ex. BP 138/84     2 Minute Post BP 126/78           Interval HR   1 Minute HR 107     2 Minute HR 73     3 Minute HR 107     4 Minute HR 117     5 Minute HR 119     6 Minute HR 120     2 Minute Post HR 100     Interval Heart Rate? Yes           Interval Oxygen   Interval Oxygen? Yes     Baseline Oxygen Saturation % 95 %     1 Minute  Oxygen Saturation % 98 %     1 Minute Liters of Oxygen 3 L     2 Minute Oxygen Saturation % 95 %     2 Minute Liters of Oxygen 3 L     3 Minute Oxygen Saturation % 91 %     3 Minute Liters of Oxygen 3 L     4 Minute Oxygen Saturation % 92 %     4 Minute Liters of Oxygen 3 L     5 Minute Oxygen Saturation % 89 %     5 Minute Liters of Oxygen 3 L     6 Minute Oxygen Saturation % 89 %     6 Minute Liters of Oxygen 3 L     2 Minute Post Oxygen Saturation % 98 %     2 Minute Post Liters of Oxygen 3 L             Oxygen Initial Assessment:  Oxygen Initial Assessment - 05/04/21 1450       Home Oxygen   Sleep Oxygen Prescription CPAP;Continuous    Liters per minute 3    Home Exercise Oxygen Prescription Continuous     Liters per minute 3    Home Resting Oxygen Prescription Continuous    Liters per minute 3    Compliance with Home Oxygen Use Yes      Intervention   Short Term Goals To learn and exhibit compliance with exercise, home and travel O2 prescription;To learn and understand importance of monitoring SPO2 with pulse oximeter and demonstrate accurate use of the pulse oximeter.;To learn and understand importance of maintaining oxygen saturations>88%;To learn and demonstrate proper pursed lip breathing techniques or other breathing techniques. ;To learn and demonstrate proper use of respiratory medications    Long  Term Goals Exhibits compliance with exercise, home  and travel O2 prescription;Verbalizes importance of monitoring SPO2 with pulse oximeter and return demonstration;Maintenance of O2 saturations>88%;Exhibits proper breathing techniques, such as pursed lip breathing or other method taught during program session;Compliance with respiratory medication;Demonstrates proper use of MDI's             Oxygen Re-Evaluation:   Oxygen Discharge (Final Oxygen Re-Evaluation):   Initial Exercise Prescription:  Initial Exercise Prescription - 05/18/21 1500       Date of Initial Exercise RX and Referring Provider   Date 05/18/21    Referring Provider [name]      Oxygen   Oxygen Continuous    Liters 3    Maintain Oxygen Saturation 88% or higher      Treadmill   MPH 1.5    Grade 0    Minutes 15    METs 2.15      NuStep   Level 1    SPM 80    Minutes 15    METs 2      REL-XR   Level 1    Speed 50    Minutes 15    METs 2      T5 Nustep   Level 1    SPM 80    Minutes 15    METs 2      Track   Laps 20    Minutes 15    METs 2      Prescription Details   Frequency (times per week) 3    Duration Progress to 30 minutes of continuous aerobic without signs/symptoms of physical distress      Intensity   THRR 40-80% of Max Heartrate  112-135    Ratings of Perceived Exertion  11-13    Perceived Dyspnea 0-4      Resistance Training   Training Prescription Yes    Weight 3 lb    Reps 10-15             Perform Capillary Blood Glucose checks as needed.  Exercise Prescription Changes:   Exercise Prescription Changes     Row Name 05/18/21 1500             Response to Exercise   Blood Pressure (Admit) 118/68       Blood Pressure (Exercise) 138/84       Blood Pressure (Exit) 126/78       Heart Rate (Admit) 90 bpm       Heart Rate (Exercise) 120 bpm       Heart Rate (Exit) 100 bpm       Oxygen Saturation (Admit) 95 %       Oxygen Saturation (Exercise) 89 %       Oxygen Saturation (Exit) 98 %       Rating of Perceived Exertion (Exercise) 11       Perceived Dyspnea (Exercise) 2                Exercise Comments:   Exercise Goals and Review:   Exercise Goals     Row Name 05/18/21 1552             Exercise Goals   Increase Physical Activity Yes       Intervention Provide advice, education, support and counseling about physical activity/exercise needs.;Develop an individualized exercise prescription for aerobic and resistive training based on initial evaluation findings, risk stratification, comorbidities and participant's personal goals.       Expected Outcomes Short Term: Attend rehab on a regular basis to increase amount of physical activity.;Long Term: Add in home exercise to make exercise part of routine and to increase amount of physical activity.;Long Term: Exercising regularly at least 3-5 days a week.       Increase Strength and Stamina Yes       Intervention Provide advice, education, support and counseling about physical activity/exercise needs.;Develop an individualized exercise prescription for aerobic and resistive training based on initial evaluation findings, risk stratification, comorbidities and participant's personal goals.       Expected Outcomes Short Term: Increase workloads from initial exercise prescription for  resistance, speed, and METs.;Short Term: Perform resistance training exercises routinely during rehab and add in resistance training at home;Long Term: Improve cardiorespiratory fitness, muscular endurance and strength as measured by increased METs and functional capacity (6MWT)       Able to understand and use rate of perceived exertion (RPE) scale Yes       Intervention Provide education and explanation on how to use RPE scale       Expected Outcomes Short Term: Able to use RPE daily in rehab to express subjective intensity level;Long Term:  Able to use RPE to guide intensity level when exercising independently       Able to understand and use Dyspnea scale Yes       Intervention Provide education and explanation on how to use Dyspnea scale       Expected Outcomes Short Term: Able to use Dyspnea scale daily in rehab to express subjective sense of shortness of breath during exertion;Long Term: Able to use Dyspnea scale to guide intensity level when exercising independently       Knowledge and understanding  of Target Heart Rate Range (THRR) Yes       Intervention Provide education and explanation of THRR including how the numbers were predicted and where they are located for reference       Expected Outcomes Short Term: Able to state/look up THRR;Short Term: Able to use daily as guideline for intensity in rehab;Long Term: Able to use THRR to govern intensity when exercising independently       Able to check pulse independently Yes       Intervention Provide education and demonstration on how to check pulse in carotid and radial arteries.;Review the importance of being able to check your own pulse for safety during independent exercise       Expected Outcomes Short Term: Able to explain why pulse checking is important during independent exercise;Long Term: Able to check pulse independently and accurately       Understanding of Exercise Prescription Yes       Intervention Provide education, explanation,  and written materials on patient's individual exercise prescription       Expected Outcomes Short Term: Able to explain program exercise prescription;Long Term: Able to explain home exercise prescription to exercise independently                Exercise Goals Re-Evaluation :   Discharge Exercise Prescription (Final Exercise Prescription Changes):  Exercise Prescription Changes - 05/18/21 1500       Response to Exercise   Blood Pressure (Admit) 118/68    Blood Pressure (Exercise) 138/84    Blood Pressure (Exit) 126/78    Heart Rate (Admit) 90 bpm    Heart Rate (Exercise) 120 bpm    Heart Rate (Exit) 100 bpm    Oxygen Saturation (Admit) 95 %    Oxygen Saturation (Exercise) 89 %    Oxygen Saturation (Exit) 98 %    Rating of Perceived Exertion (Exercise) 11    Perceived Dyspnea (Exercise) 2             Nutrition:  Target Goals: Understanding of nutrition guidelines, daily intake of sodium 1500mg , cholesterol 200mg , calories 30% from fat and 7% or less from saturated fats, daily to have 5 or more servings of fruits and vegetables.  Education: All About Nutrition: -Group instruction provided by verbal, written material, interactive activities, discussions, models, and posters to present general guidelines for heart healthy nutrition including fat, fiber, MyPlate, the role of sodium in heart healthy nutrition, utilization of the nutrition label, and utilization of this knowledge for meal planning. Follow up email sent as well. Written material given at graduation.   Biometrics:  Pre Biometrics - 05/18/21 1552       Pre Biometrics   Height 5' 5.5" (1.664 m)    Weight 203 lb 3.2 oz (92.2 kg)    BMI (Calculated) 33.29    Single Leg Stand 2.35 seconds              Nutrition Therapy Plan and Nutrition Goals:   Nutrition Assessments:  MEDIFICTS Score Key: ?70 Need to make dietary changes  40-70 Heart Healthy Diet ? 40 Therapeutic Level Cholesterol  Diet  Flowsheet Row Pulmonary Rehab from 05/18/2021 in [facility] Cardiac and Pulmonary Rehab  Picture Your Plate Total Score on Admission 61      Picture Your Plate Scores: <51 Unhealthy dietary pattern with much room for improvement. 41-50 Dietary pattern unlikely to meet recommendations for good health and room for improvement. 51-60 More healthful dietary pattern, with some room for improvement.  >  60 Healthy dietary pattern, although there may be some specific behaviors that could be improved.   Nutrition Goals Re-Evaluation:   Nutrition Goals Discharge (Final Nutrition Goals Re-Evaluation):   Psychosocial: Target Goals: Acknowledge presence or absence of significant depression and/or stress, maximize coping skills, provide positive support system. Participant is able to verbalize types and ability to use techniques and skills needed for reducing stress and depression.   Education: Stress, Anxiety, and Depression - Group verbal and visual presentation to define topics covered.  Reviews how body is impacted by stress, anxiety, and depression.  Also discusses healthy ways to reduce stress and to treat/manage anxiety and depression.  Written material given at graduation.   Education: Sleep Hygiene -Provides group verbal and written instruction about how sleep can affect your health.  Define sleep hygiene, discuss sleep cycles and impact of sleep habits. Review good sleep hygiene tips.    Initial Review & Psychosocial Screening:  Initial Psych Review & Screening - 05/04/21 1453       Initial Review   Current issues with None Identified      Family Dynamics   Good Support System? Yes   husband and children  2 nearby  one in [place]     Barriers   Psychosocial barriers to participate in program There are no identifiable barriers or psychosocial needs.      Screening Interventions   Interventions Encouraged to exercise;To provide support and resources with identified psychosocial  needs;Provide feedback about the scores to participant    Expected Outcomes Short Term goal: Utilizing psychosocial counselor, staff and physician to assist with identification of specific Stressors or current issues interfering with healing process. Setting desired goal for each stressor or current issue identified.;Long Term Goal: Stressors or current issues are controlled or eliminated.;Short Term goal: Identification and review with participant of any Quality of Life or Depression concerns found by scoring the questionnaire.;Long Term goal: The participant improves quality of Life and PHQ9 Scores as seen by post scores and/or verbalization of changes             Quality of Life Scores:  Scores of 19 and below usually indicate a poorer quality of life in these areas.  A difference of  2-3 points is a clinically meaningful difference.  A difference of 2-3 points in the total score of the Quality of Life Index has been associated with significant improvement in overall quality of life, self-image, physical symptoms, and general health in studies assessing change in quality of life.  PHQ-9: Recent Review Flowsheet Data     Depression screen [facility] 2/9 05/18/2021 04/13/2021 11/14/2020 04/20/2020 04/12/2020   Decreased Interest 1 0 0 0 0   Down, Depressed, Hopeless 1 0 0 0 0   PHQ - 2 Score 2 0 0 0 0   Altered sleeping 1 - - - -   Tired, decreased energy 2 - - - -   Change in appetite 1 - - - -   Feeling bad or failure about yourself  1 - - - -   Trouble concentrating 1 - - - -   Moving slowly or fidgety/restless 0 - - - -   Suicidal thoughts 0 - - - -   PHQ-9 Score 8 - - - -   Difficult doing work/chores Somewhat difficult - - - -      Interpretation of Total Score  Total Score Depression Severity:  1-4 = Minimal depression, 5-9 = Mild depression, 10-14 = Moderate depression, 15-19 =  Moderately severe depression, 20-27 = Severe depression   Psychosocial Evaluation and Intervention:   Psychosocial Evaluation - 05/04/21 1505       Psychosocial Evaluation & Interventions   Interventions Encouraged to exercise with the program and follow exercise prescription    Comments Meriam has no barriers to attending the program. She wants to gain back some of her staamina and improve her breathing. She lives with her husband of 64 years. She has him as part of her support, as well as 3 children- 2 nearby and one in [place]. She had been in the program in 2018 and is ready to do what she can to acheive her goals.    Expected Outcomes STG: Marcile is able to attend all scheduled sessions. She is able to see improvment in her breathing and stamina. LTG: Autymn continues with her exercise progress and symptom control    Continue Psychosocial Services  Follow up required by staff             Psychosocial Re-Evaluation:   Psychosocial Discharge (Final Psychosocial Re-Evaluation):   Education: Education Goals: Education classes will be provided on a weekly basis, covering required topics. Participant will state understanding/return demonstration of topics presented.  Learning Barriers/Preferences:  Learning Barriers/Preferences - 05/04/21 1456       Learning Barriers/Preferences   Learning Barriers None    Learning Preferences None             General Pulmonary Education Topics:  Infection Prevention: - Provides verbal and written material to individual with discussion of infection control including proper hand washing and proper equipment cleaning during exercise session. Flowsheet Row Pulmonary Rehab from 05/18/2021 in [facility] Cardiac and Pulmonary Rehab  Date 05/18/21  Educator AS  Instruction Review Code 1- Verbalizes Understanding       Falls Prevention: - Provides verbal and written material to individual with discussion of falls prevention and safety. Flowsheet Row Pulmonary Rehab from 05/18/2021 in [facility] Cardiac and Pulmonary Rehab  Date 05/18/21  Educator AS   Instruction Review Code 1- Verbalizes Understanding       Chronic Lung Disease Review: - Group verbal instruction with posters, models, PowerPoint presentations and videos,  to review new updates, new respiratory medications, new advancements in procedures and treatments. Providing information on websites and "800" numbers for continued self-education. Includes information about supplement oxygen, available portable oxygen systems, continuous and intermittent flow rates, oxygen safety, concentrators, and Medicare reimbursement for oxygen. Explanation of Pulmonary Drugs, including class, frequency, complications, importance of spacers, rinsing mouth after steroid MDI's, and proper cleaning methods for nebulizers. Review of basic lung anatomy and physiology related to function, structure, and complications of lung disease. Review of risk factors. Discussion about methods for diagnosing sleep apnea and types of masks and machines for OSA. Includes a review of the use of types of environmental controls: home humidity, furnaces, filters, dust mite/pet prevention, HEPA vacuums. Discussion about weather changes, air quality and the benefits of nasal washing. Instruction on Warning signs, infection symptoms, calling MD promptly, preventive modes, and value of vaccinations. Review of effective airway clearance, coughing and/or vibration techniques. Emphasizing that all should Create an Action Plan. Written material given at graduation.   AED/CPR: - Group verbal and written instruction with the use of models to demonstrate the basic use of the AED with the basic ABC's of resuscitation.    Anatomy and Cardiac Procedures: - Group verbal and visual presentation and models provide information about basic cardiac anatomy and function. Reviews the testing  methods done to diagnose heart disease and the outcomes of the test results. Describes the treatment choices: Medical Management, Angioplasty, or Coronary Bypass  Surgery for treating various heart conditions including Myocardial Infarction, Angina, Valve Disease, and Cardiac Arrhythmias.  Written material given at graduation.   Medication Safety: - Group verbal and visual instruction to review commonly prescribed medications for heart and lung disease. Reviews the medication, class of the drug, and side effects. Includes the steps to properly store meds and maintain the prescription regimen.  Written material given at graduation.   Other: -Provides group and verbal instruction on various topics (see comments)   Knowledge Questionnaire Score:  Knowledge Questionnaire Score - 05/18/21 1555       Knowledge Questionnaire Score   Pre Score 15/18              Core Components/Risk Factors/Patient Goals at Admission:  Personal Goals and Risk Factors at Admission - 05/18/21 1600       Core Components/Risk Factors/Patient Goals on Admission    Weight Management Yes    Intervention Weight Management: Develop a combined nutrition and exercise program designed to reach desired caloric intake, while maintaining appropriate intake of nutrient and fiber, sodium and fats, and appropriate energy expenditure required for the weight goal.;Weight Management/Obesity: Establish reasonable short term and long term weight goals.    Admit Weight 204 lb (92.5 kg)    Goal Weight: Short Term 200 lb (90.7 kg)    Goal Weight: Long Term 150 lb (68 kg)    Expected Outcomes Short Term: Continue to assess and modify interventions until short term weight is achieved;Long Term: Adherence to nutrition and physical activity/exercise program aimed toward attainment of established weight goal;Weight Loss: Understanding of general recommendations for a balanced deficit meal plan, which promotes 1-2 lb weight loss per week and includes a negative energy balance of [phone] kcal/d    Improve shortness of breath with ADL's Yes    Intervention Provide education, individualized  exercise plan and daily activity instruction to help decrease symptoms of SOB with activities of daily living.    Expected Outcomes Short Term: Improve cardiorespiratory fitness to achieve a reduction of symptoms when performing ADLs;Long Term: Be able to perform more ADLs without symptoms or delay the onset of symptoms    Intervention Provide education, demonstration and support about specific breathing techniuqes utilized for more efficient breathing. Include techniques such as pursed lipped breathing, diaphragmatic breathing and self-pacing activity.    Expected Outcomes Short Term: Participant will be able to demonstrate and use breathing techniques as needed throughout daily activities.    Increase knowledge of respiratory medications and ability to use respiratory devices properly  Yes    Intervention Provide education and demonstration as needed of appropriate use of medications, inhalers, and oxygen therapy.    Expected Outcomes Short Term: Achieves understanding of medications use. Understands that oxygen is a medication prescribed by physician. Demonstrates appropriate use of inhaler and oxygen therapy.;Long Term: Maintain appropriate use of medications, inhalers, and oxygen therapy.    Diabetes Yes    Intervention Provide education about signs/symptoms and action to take for hypo/hyperglycemia.;Provide education about proper nutrition, including hydration, and aerobic/resistive exercise prescription along with prescribed medications to achieve blood glucose in normal ranges: Fasting glucose 65-99 mg/dL    Expected Outcomes Short Term: Participant verbalizes understanding of the signs/symptoms and immediate care of hyper/hypoglycemia, proper foot care and importance of medication, aerobic/resistive exercise and nutrition plan for blood glucose control.;Long Term: Attainment of HbA1C <  7%.    Heart Failure Yes    Intervention Provide a combined exercise and nutrition program that is supplemented  with education, support and counseling about heart failure. Directed toward relieving symptoms such as shortness of breath, decreased exercise tolerance, and extremity edema.    Expected Outcomes Improve functional capacity of life;Short term: Attendance in program 2-3 days a week with increased exercise capacity. Reported lower sodium intake. Reported increased fruit and vegetable intake. Reports medication compliance.;Short term: Daily weights obtained and reported for increase. Utilizing diuretic protocols set by physician.;Long term: Adoption of self-care skills and reduction of barriers for early signs and symptoms recognition and intervention leading to self-care maintenance.    Hypertension Yes    Intervention Provide education on lifestyle modifcations including regular physical activity/exercise, weight management, moderate sodium restriction and increased consumption of fresh fruit, vegetables, and low fat dairy, alcohol moderation, and smoking cessation.;Monitor prescription use compliance.    Expected Outcomes Short Term: Continued assessment and intervention until BP is < 140/31mm HG in hypertensive participants. < 130/16mm HG in hypertensive participants with diabetes, heart failure or chronic kidney disease.;Long Term: Maintenance of blood pressure at goal levels.    Lipids Yes    Intervention Provide education and support for participant on nutrition & aerobic/resistive exercise along with prescribed medications to achieve LDL 70mg , HDL >40mg .    Expected Outcomes Short Term: Participant states understanding of desired cholesterol values and is compliant with medications prescribed. Participant is following exercise prescription and nutrition guidelines.;Long Term: Cholesterol controlled with medications as prescribed, with individualized exercise RX and with personalized nutrition plan. Value goals: LDL < 70mg , HDL > 40 mg.             Education:Diabetes - Individual verbal and  written instruction to review signs/symptoms of diabetes, desired ranges of glucose level fasting, after meals and with exercise. Acknowledge that pre and post exercise glucose checks will be done for 3 sessions at entry of program. Flowsheet Row Pulmonary Rehab from 05/18/2021 in [facility] Cardiac and Pulmonary Rehab  Date 05/18/21  Educator AS  Instruction Review Code 1- Verbalizes Understanding       Know Your Numbers and Heart Failure: - Group verbal and visual instruction to discuss disease risk factors for cardiac and pulmonary disease and treatment options.  Reviews associated critical values for Overweight/Obesity, Hypertension, Cholesterol, and Diabetes.  Discusses basics of heart failure: signs/symptoms and treatments.  Introduces Heart Failure Zone chart for action plan for heart failure.  Written material given at graduation.   Core Components/Risk Factors/Patient Goals Review:    Core Components/Risk Factors/Patient Goals at Discharge (Final Review):    ITP Comments:  ITP Comments     Row Name 05/04/21 1511           ITP Comments Virtual orientation call completed today. shehas an appointment on Date: 05/18/2021  for EP eval and gym Orientation.  Documentation of diagnosis can be found in [facility] Date:   02/07/2021                Comments: initial ITP

## 2021-05-18 NOTE — Patient Instructions (Signed)
Patient Instructions  Patient Details  Name: Betty [name] MRN: [id number] Date of Birth: February 01, 1946 Referring Provider:  Collene Gobble, MD  Below are your personal goals for exercise, nutrition, and risk factors. Our goal is to help you stay on track towards obtaining and maintaining these goals. We will be discussing your progress on these goals with you throughout the program.  Initial Exercise Prescription:  Initial Exercise Prescription - 05/18/21 1500       Date of Initial Exercise RX and Referring Provider   Date 05/18/21    Referring Provider [name]      Oxygen   Oxygen Continuous    Liters 3    Maintain Oxygen Saturation 88% or higher      Treadmill   MPH 1.5    Grade 0    Minutes 15    METs 2.15      NuStep   Level 1    SPM 80    Minutes 15    METs 2      REL-XR   Level 1    Speed 50    Minutes 15    METs 2      T5 Nustep   Level 1    SPM 80    Minutes 15    METs 2      Track   Laps 20    Minutes 15    METs 2      Prescription Details   Frequency (times per week) 3    Duration Progress to 30 minutes of continuous aerobic without signs/symptoms of physical distress      Intensity   THRR 40-80% of Max Heartrate 112-135    Ratings of Perceived Exertion 11-13    Perceived Dyspnea 0-4      Resistance Training   Training Prescription Yes    Weight 3 lb    Reps 10-15             Exercise Goals: Frequency: Be able to perform aerobic exercise two to three times per week in program working toward 2-5 days per week of home exercise.  Intensity: Work with a perceived exertion of 11 (fairly light) - 15 (hard) while following your exercise prescription.  We will make changes to your prescription with you as you progress through the program.   Duration: Be able to do 30 to 45 minutes of continuous aerobic exercise in addition to a 5 minute warm-up and a 5 minute cool-down routine.   Nutrition Goals: Your personal nutrition goals will be  established when you do your nutrition analysis with the dietician.  The following are general nutrition guidelines to follow: Cholesterol < 200mg /day Sodium < 1500mg /day Fiber: Women over 50 yrs - 21 grams per day  Personal Goals:  Personal Goals and Risk Factors at Admission - 05/18/21 1600       Core Components/Risk Factors/Patient Goals on Admission    Weight Management Yes    Intervention Weight Management: Develop a combined nutrition and exercise program designed to reach desired caloric intake, while maintaining appropriate intake of nutrient and fiber, sodium and fats, and appropriate energy expenditure required for the weight goal.;Weight Management/Obesity: Establish reasonable short term and long term weight goals.    Admit Weight 204 lb (92.5 kg)    Goal Weight: Short Term 200 lb (90.7 kg)    Goal Weight: Long Term 150 lb (68 kg)    Expected Outcomes Short Term: Continue to assess and modify interventions until short term  weight is achieved;Long Term: Adherence to nutrition and physical activity/exercise program aimed toward attainment of established weight goal;Weight Loss: Understanding of general recommendations for a balanced deficit meal plan, which promotes 1-2 lb weight loss per week and includes a negative energy balance of [phone] kcal/d    Improve shortness of breath with ADL's Yes    Intervention Provide education, individualized exercise plan and daily activity instruction to help decrease symptoms of SOB with activities of daily living.    Expected Outcomes Short Term: Improve cardiorespiratory fitness to achieve a reduction of symptoms when performing ADLs;Long Term: Be able to perform more ADLs without symptoms or delay the onset of symptoms    Intervention Provide education, demonstration and support about specific breathing techniuqes utilized for more efficient breathing. Include techniques such as pursed lipped breathing, diaphragmatic breathing and self-pacing  activity.    Expected Outcomes Short Term: Participant will be able to demonstrate and use breathing techniques as needed throughout daily activities.    Increase knowledge of respiratory medications and ability to use respiratory devices properly  Yes    Intervention Provide education and demonstration as needed of appropriate use of medications, inhalers, and oxygen therapy.    Expected Outcomes Short Term: Achieves understanding of medications use. Understands that oxygen is a medication prescribed by physician. Demonstrates appropriate use of inhaler and oxygen therapy.;Long Term: Maintain appropriate use of medications, inhalers, and oxygen therapy.    Diabetes Yes    Intervention Provide education about signs/symptoms and action to take for hypo/hyperglycemia.;Provide education about proper nutrition, including hydration, and aerobic/resistive exercise prescription along with prescribed medications to achieve blood glucose in normal ranges: Fasting glucose 65-99 mg/dL    Expected Outcomes Short Term: Participant verbalizes understanding of the signs/symptoms and immediate care of hyper/hypoglycemia, proper foot care and importance of medication, aerobic/resistive exercise and nutrition plan for blood glucose control.;Long Term: Attainment of HbA1C < 7%.    Heart Failure Yes    Intervention Provide a combined exercise and nutrition program that is supplemented with education, support and counseling about heart failure. Directed toward relieving symptoms such as shortness of breath, decreased exercise tolerance, and extremity edema.    Expected Outcomes Improve functional capacity of life;Short term: Attendance in program 2-3 days a week with increased exercise capacity. Reported lower sodium intake. Reported increased fruit and vegetable intake. Reports medication compliance.;Short term: Daily weights obtained and reported for increase. Utilizing diuretic protocols set by physician.;Long term:  Adoption of self-care skills and reduction of barriers for early signs and symptoms recognition and intervention leading to self-care maintenance.    Hypertension Yes    Intervention Provide education on lifestyle modifcations including regular physical activity/exercise, weight management, moderate sodium restriction and increased consumption of fresh fruit, vegetables, and low fat dairy, alcohol moderation, and smoking cessation.;Monitor prescription use compliance.    Expected Outcomes Short Term: Continued assessment and intervention until BP is < 140/53mm HG in hypertensive participants. < 130/62mm HG in hypertensive participants with diabetes, heart failure or chronic kidney disease.;Long Term: Maintenance of blood pressure at goal levels.    Lipids Yes    Intervention Provide education and support for participant on nutrition & aerobic/resistive exercise along with prescribed medications to achieve LDL 70mg , HDL >40mg .    Expected Outcomes Short Term: Participant states understanding of desired cholesterol values and is compliant with medications prescribed. Participant is following exercise prescription and nutrition guidelines.;Long Term: Cholesterol controlled with medications as prescribed, with individualized exercise RX and with personalized nutrition plan. Value goals:  LDL < 70mg , HDL > 40 mg.             Tobacco Use Initial Evaluation: Social History   Tobacco Use  Smoking Status Never  Smokeless Tobacco Never    Exercise Goals and Review:  Exercise Goals     Row Name 05/18/21 1552             Exercise Goals   Increase Physical Activity Yes       Intervention Provide advice, education, support and counseling about physical activity/exercise needs.;Develop an individualized exercise prescription for aerobic and resistive training based on initial evaluation findings, risk stratification, comorbidities and participant's personal goals.       Expected Outcomes Short  Term: Attend rehab on a regular basis to increase amount of physical activity.;Long Term: Add in home exercise to make exercise part of routine and to increase amount of physical activity.;Long Term: Exercising regularly at least 3-5 days a week.       Increase Strength and Stamina Yes       Intervention Provide advice, education, support and counseling about physical activity/exercise needs.;Develop an individualized exercise prescription for aerobic and resistive training based on initial evaluation findings, risk stratification, comorbidities and participant's personal goals.       Expected Outcomes Short Term: Increase workloads from initial exercise prescription for resistance, speed, and METs.;Short Term: Perform resistance training exercises routinely during rehab and add in resistance training at home;Long Term: Improve cardiorespiratory fitness, muscular endurance and strength as measured by increased METs and functional capacity (6MWT)       Able to understand and use rate of perceived exertion (RPE) scale Yes       Intervention Provide education and explanation on how to use RPE scale       Expected Outcomes Short Term: Able to use RPE daily in rehab to express subjective intensity level;Long Term:  Able to use RPE to guide intensity level when exercising independently       Able to understand and use Dyspnea scale Yes       Intervention Provide education and explanation on how to use Dyspnea scale       Expected Outcomes Short Term: Able to use Dyspnea scale daily in rehab to express subjective sense of shortness of breath during exertion;Long Term: Able to use Dyspnea scale to guide intensity level when exercising independently       Knowledge and understanding of Target Heart Rate Range (THRR) Yes       Intervention Provide education and explanation of THRR including how the numbers were predicted and where they are located for reference       Expected Outcomes Short Term: Able to  state/look up THRR;Short Term: Able to use daily as guideline for intensity in rehab;Long Term: Able to use THRR to govern intensity when exercising independently       Able to check pulse independently Yes       Intervention Provide education and demonstration on how to check pulse in carotid and radial arteries.;Review the importance of being able to check your own pulse for safety during independent exercise       Expected Outcomes Short Term: Able to explain why pulse checking is important during independent exercise;Long Term: Able to check pulse independently and accurately       Understanding of Exercise Prescription Yes       Intervention Provide education, explanation, and written materials on patient's individual exercise prescription       Expected  Outcomes Short Term: Able to explain program exercise prescription;Long Term: Able to explain home exercise prescription to exercise independently                Copy of goals given to participant.

## 2021-05-27 ENCOUNTER — Other Ambulatory Visit: Payer: Self-pay | Admitting: Family Medicine

## 2021-05-27 MED ORDER — METFORMIN HCL ER 500 MG PO TB24: 1000.0000 mg | ORAL_TABLET | Freq: Two times a day (BID) | ORAL | 1 refills | Status: DC

## 2021-05-29 ENCOUNTER — Encounter: Payer: Medicare PPO | Attending: Emergency Medicine

## 2021-05-29 ENCOUNTER — Other Ambulatory Visit: Payer: Self-pay

## 2021-05-29 DIAGNOSIS — J984 Other disorders of lung: Secondary | ICD-10-CM | POA: Diagnosis not present

## 2021-05-29 LAB — GLUCOSE, CAPILLARY
Glucose-Capillary: 144 mg/dL — ABNORMAL HIGH (ref 70–99)
Glucose-Capillary: 128 mg/dL — ABNORMAL HIGH (ref 70–99)

## 2021-05-29 NOTE — Progress Notes (Signed)
Daily Session Note  Patient Details  Name: Betty [name] MRN: [id number] Date of Birth: 21-Nov-1945 Referring Provider:   April Manson Pulmonary Rehab from 05/18/2021 in [facility] Cardiac and Pulmonary Rehab  Referring Provider Betty [name]       Encounter Date: 05/29/2021  Check In:  Session Check In - 05/29/21 7342       Check-In   Supervising physician immediately available to respond to emergencies See telemetry face sheet for immediately available ER MD    Location [facility]-Cardiac & Pulmonary Rehab    Staff Present [name], MPA, Elveria Rising, BA, ACSM CEP, Exercise Physiologist;[name] Amedeo Plenty, BS, ACSM CEP, Exercise Physiologist    Virtual Visit No    Medication changes reported     Yes    Comments added 3 tabs of metformin/ day    Fall or balance concerns reported    No    Warm-up and Cool-down Performed on first and last piece of equipment    Resistance Training Performed Yes    VAD Patient? No    PAD/SET Patient? No      Pain Assessment   Currently in Pain? No/denies                Social History   Tobacco Use  Smoking Status Never  Smokeless Tobacco Never    Goals Met:  Independence with exercise equipment Exercise tolerated well No report of concerns or symptoms today Strength training completed today  Goals Unmet:  Not Applicable  Comments: First full day of exercise!  Patient was oriented to gym and equipment including functions, settings, policies, and procedures.  Patient's individual exercise prescription and treatment plan were reviewed.  All starting workloads were established based on the results of the 6 minute walk test done at initial orientation visit.  The plan for exercise progression was also introduced and progression will be customized based on patient's performance and goals.    Dr. Emily [name] is Medical Director for [name].  Dr. Ottie [name] is Medical Director for [facility] Pulmonary  Rehabilitation.

## 2021-05-30 ENCOUNTER — Encounter: Payer: Self-pay | Admitting: Family Medicine

## 2021-05-30 DIAGNOSIS — E1121 Type 2 diabetes mellitus with diabetic nephropathy: Secondary | ICD-10-CM

## 2021-05-30 NOTE — Telephone Encounter (Signed)
Please call the pharmacy.  Please see what metformin prescription they have on file.  I sent a refill in recently for her to be on metformin extended release 500 mg tablets with the directions of taking 2 tablets twice daily with meals.

## 2021-05-31 ENCOUNTER — Encounter: Payer: Self-pay | Admitting: *Deleted

## 2021-05-31 ENCOUNTER — Other Ambulatory Visit: Payer: Self-pay

## 2021-05-31 DIAGNOSIS — J984 Other disorders of lung: Secondary | ICD-10-CM

## 2021-05-31 LAB — GLUCOSE, CAPILLARY: Glucose-Capillary: 181 mg/dL — ABNORMAL HIGH (ref 70–99)

## 2021-05-31 MED ORDER — METFORMIN HCL ER 500 MG PO TB24: 1000.0000 mg | ORAL_TABLET | Freq: Two times a day (BID) | ORAL | 1 refills | Status: DC

## 2021-05-31 NOTE — Progress Notes (Signed)
Pulmonary Individual Treatment Plan  Patient Details  Name: Betty [name] MRN: [id number] Date of Birth: [birth year] Referring Provider:   April Manson Pulmonary Rehab from 05/18/2021 in [facility] Cardiac and Pulmonary Rehab  Referring Provider [name]       Initial Encounter Date:  Flowsheet Row Pulmonary Rehab from 05/18/2021 in [facility] Cardiac and Pulmonary Rehab  Date 05/18/21       Visit Diagnosis: Restrictive lung disease  Patient's Home Medications on Admission:  Current Outpatient Medications:    acetic acid 2 % otic solution, Insert saturated wick of cotton; keep moist 24 hours by adding 3 to 5 drops every 4 to 6 hours; remove wick after 24 hours and instill 5 drops 3 to 4 times daily, Disp: 15 mL, Rfl: 0   albuterol (VENTOLIN HFA) 108 (90 Base) MCG/ACT inhaler, Inhale 2 puffs into the lungs every 4 (four) hours as needed for wheezing or shortness of breath., Disp: 8 g, Rfl: 6   aspirin 81 MG EC tablet, Take 1 tablet (81 mg total) by mouth daily., Disp: 90 tablet, Rfl: 2   CALCIUM-MAGNESIUM-VITAMIN D PO, Take 1 tablet by mouth daily., Disp: , Rfl:    carvedilol (COREG) 12.5 MG tablet, TAKE 1 TABLET BY MOUTH TWICE A DAY WITH MEALS, Disp: 60 tablet, Rfl: 11   clobetasol (TEMOVATE) 0.05 % GEL, Apply topically 2 (two) times daily., Disp: , Rfl:    Coenzyme Q10 (CO Q 10 PO), Take 200 mg by mouth at bedtime. , Disp: , Rfl:    Colchicine (MITIGARE) 0.6 MG CAPS, Day 1: take 1.2 mg (2 tablets) by mouth at the first sign of flare, followed by 0.6 mg (1 tablet) by mouth after 1 hour. Day 2 and thereafter: take 0.6 mg (1 tablet) by mouth daily until flare has resolved., Disp: 30 capsule, Rfl: 0   Fish Oil OIL, Take 1,200 mg by mouth 2 (two) times daily. GUMMIES, Disp: , Rfl:    fluticasone (FLONASE) 50 MCG/ACT nasal spray, Place 2 sprays into both nostrils daily., Disp: 16 g, Rfl: 6   losartan (COZAAR) 50 MG tablet, TAKE 1/2 TABLET BY MOUTH DAILY, Disp: 45 tablet, Rfl: 3   metFORMIN (GLUCOPHAGE  XR) 500 MG 24 hr tablet, Take 2 tablets (1,000 mg total) by mouth 2 (two) times daily with a meal., Disp: 360 tablet, Rfl: 1   multivitamin (THERAGRAN) per tablet, Take 1 tablet by mouth daily., Disp: , Rfl:    nystatin cream (MYCOSTATIN), Apply 1 application topically 2 (two) times daily., Disp: 30 g, Rfl: 0   polyvinyl alcohol (LIQUIFILM TEARS) 1.4 % ophthalmic solution, Place 1 drop into both eyes every morning., Disp: , Rfl:    pravastatin (PRAVACHOL) 20 MG tablet, Take 1 tablet (20 mg total) by mouth daily., Disp: 90 tablet, Rfl: 1  Past Medical History: Past Medical History:  Diagnosis Date   Allergic rhinitis    never tested, fall and spring   Arthritis    hands,knees, feet   Cataracts, bilateral    not surgical yet   CHOLECYSTECTOMY, HX OF 10/08/2007   Qualifier: Diagnosis of  By: Council Mechanic MD, [name]    Chronic diastolic CHF (congestive heart failure) (HCC)    Chronic respiratory failure ([place])    Diabetes mellitus without complication ([place])    Diverticulosis    problems with frequent gas, burping   Glucose intolerance (impaired glucose tolerance)    Gout    History of chicken pox    Hyperlipidemia    Hypertension  NICM (nonischemic cardiomyopathy) ([place]) 2002   EF 25%; improved to normal - echo 4/08: EF 50-55%, mild MR, mild LAE, mild TR;    cath 3/03: normal cors, EF 40%   Obesity    Oxygen deficiency 2017   at night   PONV (postoperative nausea and vomiting)    after wisdom tooth extraction   Restrictive lung disease    Skin cancer    skin cancers only   Sleep apnea    cpap settings 4    Tobacco Use: Social History   Tobacco Use  Smoking Status Never  Smokeless Tobacco Never    Labs: Recent Review Flowsheet Data     Labs for ITP Cardiac and Pulmonary Rehab Latest Ref Rng & Units 11/14/2020 12/26/2020 02/21/2021 04/11/2021 05/17/2021   Cholestrol 0 - 200 mg/dL - - - - -   LDLCALC 0 - 99 mg/dL - - - - -   LDLDIRECT mg/dL 114.0 86.0 79.0 63.0 -   HDL  >39.00 mg/dL - - - - -   Trlycerides 0.0 - 149.0 mg/dL - - - - -   Hemoglobin A1c 4.6 - 6.5 % 6.9(H) - - - 7.2(H)   HCO3 20.0 - 24.0 mEq/L - - - - -   TCO2 0 - 100 mmol/L - - - - -   O2SAT % - - - - -        Pulmonary Assessment Scores:  Pulmonary Assessment Scores     Row Name 05/18/21 1557         ADL UCSD   ADL Phase Entry     SOB Score total 51     Rest 0     Walk 2     Stairs 2     Bath 1     Dress 1     Shop 3           CAT Score   CAT Score 15           mMRC Score   mMRC Score 2              UCSD: Self-administered rating of dyspnea associated with activities of daily living (ADLs) 6-point scale (0 = "not at all" to 5 = "maximal or unable to do because of breathlessness")  Scoring Scores range from 0 to 120.  Minimally important difference is 5 units  CAT: CAT can identify the health impairment of COPD patients and is better correlated with disease progression.  CAT has a scoring range of zero to 40. The CAT score is classified into four groups of low (less than 10), medium (10 - 20), high (21-30) and very high (31-40) based on the impact level of disease on health status. A CAT score over 10 suggests significant symptoms.  A worsening CAT score could be explained by an exacerbation, poor medication adherence, poor inhaler technique, or progression of COPD or comorbid conditions.  CAT MCID is 2 points  mMRC: mMRC (Modified Medical Research Council) Dyspnea Scale is used to assess the degree of baseline functional disability in patients of respiratory disease due to dyspnea. No minimal important difference is established. A decrease in score of 1 point or greater is considered a positive change.   Pulmonary Function Assessment:   Exercise Target Goals: Exercise Program Goal: Individual exercise prescription set using results from initial 6 min walk test and THRR while considering  patient's activity barriers and safety.   Exercise Prescription  Goal: Initial exercise prescription builds to  30-45 minutes a day of aerobic activity, 2-3 days per week.  Home exercise guidelines will be given to patient during program as part of exercise prescription that the participant will acknowledge.  Education: Aerobic Exercise: - Group verbal and visual presentation on the components of exercise prescription. Introduces F.I.T.T principle from ACSM for exercise prescriptions.  Reviews F.I.T.T. principles of aerobic exercise including progression. Written material given at graduation.   Education: Resistance Exercise: - Group verbal and visual presentation on the components of exercise prescription. Introduces F.I.T.T principle from ACSM for exercise prescriptions  Reviews F.I.T.T. principles of resistance exercise including progression. Written material given at graduation.    Education: Exercise & Equipment Safety: - Individual verbal instruction and demonstration of equipment use and safety with use of the equipment. Flowsheet Row Pulmonary Rehab from 05/31/2021 in [facility] Cardiac and Pulmonary Rehab  Date 05/18/21  Educator AS  Instruction Review Code 1- Verbalizes Understanding       Education: Exercise Physiology & General Exercise Guidelines: - Group verbal and written instruction with models to review the exercise physiology of the cardiovascular system and associated critical values. Provides general exercise guidelines with specific guidelines to those with heart or lung disease.    Education: Flexibility, Balance, Mind/Body Relaxation: - Group verbal and visual presentation with interactive activity on the components of exercise prescription. Introduces F.I.T.T principle from ACSM for exercise prescriptions. Reviews F.I.T.T. principles of flexibility and balance exercise training including progression. Also discusses the mind body connection.  Reviews various relaxation techniques to help reduce and manage stress (i.e. Deep breathing,  progressive muscle relaxation, and visualization). Balance handout provided to take home. Written material given at graduation.   Activity Barriers & Risk Stratification:  Activity Barriers & Cardiac Risk Stratification - 05/04/21 1448       Activity Barriers & Cardiac Risk Stratification   Activity Barriers Deconditioning;Other (comment);Shortness of Breath    Comments neuropathy in feet             6 Minute Walk:  6 Minute Walk     Row Name 05/18/21 1540         6 Minute Walk   Phase Initial     Distance 1045 feet     Walk Time 6 minutes     # of Rest Breaks 0     MPH 1.98     METS 2.14     RPE 11     Perceived Dyspnea  2     VO2 Peak 7.5     Symptoms No     Resting HR 90 bpm     Resting BP 118/68     Resting Oxygen Saturation  95 %     Exercise Oxygen Saturation  during 6 min walk 89 %     Max Ex. HR 120 bpm     Max Ex. BP 138/84     2 Minute Post BP 126/78           Interval HR   1 Minute HR 107     2 Minute HR 73     3 Minute HR 107     4 Minute HR 117     5 Minute HR 119     6 Minute HR 120     2 Minute Post HR 100     Interval Heart Rate? Yes           Interval Oxygen   Interval Oxygen? Yes     Baseline Oxygen Saturation % 95 %  1 Minute Oxygen Saturation % 98 %     1 Minute Liters of Oxygen 3 L     2 Minute Oxygen Saturation % 95 %     2 Minute Liters of Oxygen 3 L     3 Minute Oxygen Saturation % 91 %     3 Minute Liters of Oxygen 3 L     4 Minute Oxygen Saturation % 92 %     4 Minute Liters of Oxygen 3 L     5 Minute Oxygen Saturation % 89 %     5 Minute Liters of Oxygen 3 L     6 Minute Oxygen Saturation % 89 %     6 Minute Liters of Oxygen 3 L     2 Minute Post Oxygen Saturation % 98 %     2 Minute Post Liters of Oxygen 3 L             Oxygen Initial Assessment:  Oxygen Initial Assessment - 05/04/21 1450       Home Oxygen   Sleep Oxygen Prescription CPAP;Continuous    Liters per minute 3    Home Exercise Oxygen  Prescription Continuous    Liters per minute 3    Home Resting Oxygen Prescription Continuous    Liters per minute 3    Compliance with Home Oxygen Use Yes      Intervention   Short Term Goals To learn and exhibit compliance with exercise, home and travel O2 prescription;To learn and understand importance of monitoring SPO2 with pulse oximeter and demonstrate accurate use of the pulse oximeter.;To learn and understand importance of maintaining oxygen saturations>88%;To learn and demonstrate proper pursed lip breathing techniques or other breathing techniques. ;To learn and demonstrate proper use of respiratory medications    Long  Term Goals Exhibits compliance with exercise, home  and travel O2 prescription;Verbalizes importance of monitoring SPO2 with pulse oximeter and return demonstration;Maintenance of O2 saturations>88%;Exhibits proper breathing techniques, such as pursed lip breathing or other method taught during program session;Compliance with respiratory medication;Demonstrates proper use of MDI's             Oxygen Re-Evaluation:  Oxygen Re-Evaluation     Row Name 05/29/21 0955             Program Oxygen Prescription   Program Oxygen Prescription E-Tanks       Liters per minute 3       Comments as needed for oxygen saturation below 88%               Home Oxygen   Sleep Oxygen Prescription CPAP;Continuous       Liters per minute 3       Home Exercise Oxygen Prescription Continuous       Liters per minute 3       Home Resting Oxygen Prescription Continuous       Liters per minute 3       Compliance with Home Oxygen Use Yes               Goals/Expected Outcomes   Short Term Goals To learn and exhibit compliance with exercise, home and travel O2 prescription;To learn and understand importance of monitoring SPO2 with pulse oximeter and demonstrate accurate use of the pulse oximeter.;To learn and understand importance of maintaining oxygen saturations>88%;To learn and  demonstrate proper pursed lip breathing techniques or other breathing techniques.        Long  Term Goals Exhibits compliance with exercise,  home  and travel O2 prescription;Verbalizes importance of monitoring SPO2 with pulse oximeter and return demonstration;Maintenance of O2 saturations>88%;Exhibits proper breathing techniques, such as pursed lip breathing or other method taught during program session;Compliance with respiratory medication       Comments Reviewed PLB technique with pt.  Talked about how it works and it's importance in maintaining their exercise saturations.       Goals/Expected Outcomes Short: Become more profiecient at using PLB.   Long: Become independent at using PLB.                Oxygen Discharge (Final Oxygen Re-Evaluation):  Oxygen Re-Evaluation - 05/29/21 0955       Program Oxygen Prescription   Program Oxygen Prescription E-Tanks    Liters per minute 3    Comments as needed for oxygen saturation below 88%      Home Oxygen   Sleep Oxygen Prescription CPAP;Continuous    Liters per minute 3    Home Exercise Oxygen Prescription Continuous    Liters per minute 3    Home Resting Oxygen Prescription Continuous    Liters per minute 3    Compliance with Home Oxygen Use Yes      Goals/Expected Outcomes   Short Term Goals To learn and exhibit compliance with exercise, home and travel O2 prescription;To learn and understand importance of monitoring SPO2 with pulse oximeter and demonstrate accurate use of the pulse oximeter.;To learn and understand importance of maintaining oxygen saturations>88%;To learn and demonstrate proper pursed lip breathing techniques or other breathing techniques.     Long  Term Goals Exhibits compliance with exercise, home  and travel O2 prescription;Verbalizes importance of monitoring SPO2 with pulse oximeter and return demonstration;Maintenance of O2 saturations>88%;Exhibits proper breathing techniques, such as pursed lip breathing or other  method taught during program session;Compliance with respiratory medication    Comments Reviewed PLB technique with pt.  Talked about how it works and it's importance in maintaining their exercise saturations.    Goals/Expected Outcomes Short: Become more profiecient at using PLB.   Long: Become independent at using PLB.             Initial Exercise Prescription:  Initial Exercise Prescription - 05/18/21 1500       Date of Initial Exercise RX and Referring Provider   Date 05/18/21    Referring Provider [name]      Oxygen   Oxygen Continuous    Liters 3    Maintain Oxygen Saturation 88% or higher      Treadmill   MPH 1.5    Grade 0    Minutes 15    METs 2.15      NuStep   Level 1    SPM 80    Minutes 15    METs 2      REL-XR   Level 1    Speed 50    Minutes 15    METs 2      T5 Nustep   Level 1    SPM 80    Minutes 15    METs 2      Track   Laps 20    Minutes 15    METs 2      Prescription Details   Frequency (times per week) 3    Duration Progress to 30 minutes of continuous aerobic without signs/symptoms of physical distress      Intensity   THRR 40-80% of Max Heartrate 112-135    Ratings of Perceived  Exertion 11-13    Perceived Dyspnea 0-4      Resistance Training   Training Prescription Yes    Weight 3 lb    Reps 10-15             Perform Capillary Blood Glucose checks as needed.  Exercise Prescription Changes:   Exercise Prescription Changes     Row Name 05/18/21 1500             Response to Exercise   Blood Pressure (Admit) 118/68       Blood Pressure (Exercise) 138/84       Blood Pressure (Exit) 126/78       Heart Rate (Admit) 90 bpm       Heart Rate (Exercise) 120 bpm       Heart Rate (Exit) 100 bpm       Oxygen Saturation (Admit) 95 %       Oxygen Saturation (Exercise) 89 %       Oxygen Saturation (Exit) 98 %       Rating of Perceived Exertion (Exercise) 11       Perceived Dyspnea (Exercise) 2                 Exercise Comments:   Exercise Comments     Row Name 05/29/21 0954           Exercise Comments First full day of exercise!  Patient was oriented to gym and equipment including functions, settings, policies, and procedures.  Patient's individual exercise prescription and treatment plan were reviewed.  All starting workloads were established based on the results of the 6 minute walk test done at initial orientation visit.  The plan for exercise progression was also introduced and progression will be customized based on patient's performance and goals.                Exercise Goals and Review:   Exercise Goals     Row Name 05/18/21 1552             Exercise Goals   Increase Physical Activity Yes       Intervention Provide advice, education, support and counseling about physical activity/exercise needs.;Develop an individualized exercise prescription for aerobic and resistive training based on initial evaluation findings, risk stratification, comorbidities and participant's personal goals.       Expected Outcomes Short Term: Attend rehab on a regular basis to increase amount of physical activity.;Long Term: Add in home exercise to make exercise part of routine and to increase amount of physical activity.;Long Term: Exercising regularly at least 3-5 days a week.       Increase Strength and Stamina Yes       Intervention Provide advice, education, support and counseling about physical activity/exercise needs.;Develop an individualized exercise prescription for aerobic and resistive training based on initial evaluation findings, risk stratification, comorbidities and participant's personal goals.       Expected Outcomes Short Term: Increase workloads from initial exercise prescription for resistance, speed, and METs.;Short Term: Perform resistance training exercises routinely during rehab and add in resistance training at home;Long Term: Improve cardiorespiratory fitness, muscular  endurance and strength as measured by increased METs and functional capacity (6MWT)       Able to understand and use rate of perceived exertion (RPE) scale Yes       Intervention Provide education and explanation on how to use RPE scale       Expected Outcomes Short Term: Able to use RPE daily in  rehab to express subjective intensity level;Long Term:  Able to use RPE to guide intensity level when exercising independently       Able to understand and use Dyspnea scale Yes       Intervention Provide education and explanation on how to use Dyspnea scale       Expected Outcomes Short Term: Able to use Dyspnea scale daily in rehab to express subjective sense of shortness of breath during exertion;Long Term: Able to use Dyspnea scale to guide intensity level when exercising independently       Knowledge and understanding of Target Heart Rate Range (THRR) Yes       Intervention Provide education and explanation of THRR including how the numbers were predicted and where they are located for reference       Expected Outcomes Short Term: Able to state/look up THRR;Short Term: Able to use daily as guideline for intensity in rehab;Long Term: Able to use THRR to govern intensity when exercising independently       Able to check pulse independently Yes       Intervention Provide education and demonstration on how to check pulse in carotid and radial arteries.;Review the importance of being able to check your own pulse for safety during independent exercise       Expected Outcomes Short Term: Able to explain why pulse checking is important during independent exercise;Long Term: Able to check pulse independently and accurately       Understanding of Exercise Prescription Yes       Intervention Provide education, explanation, and written materials on patient's individual exercise prescription       Expected Outcomes Short Term: Able to explain program exercise prescription;Long Term: Able to explain home exercise  prescription to exercise independently                Exercise Goals Re-Evaluation :  Exercise Goals Re-Evaluation     Row Name 05/29/21 0954             Exercise Goal Re-Evaluation   Exercise Goals Review Increase Physical Activity;Able to understand and use rate of perceived exertion (RPE) scale;Knowledge and understanding of Target Heart Rate Range (THRR);Understanding of Exercise Prescription;Increase Strength and Stamina;Able to understand and use Dyspnea scale;Able to check pulse independently       Comments Reviewed RPE and dyspnea scales, THR and program prescription with pt today.  Pt voiced understanding and was given a copy of goals to take home.       Expected Outcomes Short: Use RPE daily to regulate intensity. Long: Follow program prescription in THR.                Discharge Exercise Prescription (Final Exercise Prescription Changes):  Exercise Prescription Changes - 05/18/21 1500       Response to Exercise   Blood Pressure (Admit) 118/68    Blood Pressure (Exercise) 138/84    Blood Pressure (Exit) 126/78    Heart Rate (Admit) 90 bpm    Heart Rate (Exercise) 120 bpm    Heart Rate (Exit) 100 bpm    Oxygen Saturation (Admit) 95 %    Oxygen Saturation (Exercise) 89 %    Oxygen Saturation (Exit) 98 %    Rating of Perceived Exertion (Exercise) 11    Perceived Dyspnea (Exercise) 2             Nutrition:  Target Goals: Understanding of nutrition guidelines, daily intake of sodium 1500mg , cholesterol 200mg , calories 30% from fat and 7%  or less from saturated fats, daily to have 5 or more servings of fruits and vegetables.  Education: All About Nutrition: -Group instruction provided by verbal, written material, interactive activities, discussions, models, and posters to present general guidelines for heart healthy nutrition including fat, fiber, MyPlate, the role of sodium in heart healthy nutrition, utilization of the nutrition label, and utilization of  this knowledge for meal planning. Follow up email sent as well. Written material given at graduation.   Biometrics:  Pre Biometrics - 05/18/21 1552       Pre Biometrics   Height 5' 5.5" (1.664 m)    Weight 203 lb 3.2 oz (92.2 kg)    BMI (Calculated) 33.29    Single Leg Stand 2.35 seconds              Nutrition Therapy Plan and Nutrition Goals:   Nutrition Assessments:  MEDIFICTS Score Key: ?70 Need to make dietary changes  40-70 Heart Healthy Diet ? 40 Therapeutic Level Cholesterol Diet  Flowsheet Row Pulmonary Rehab from 05/18/2021 in [facility] Cardiac and Pulmonary Rehab  Picture Your Plate Total Score on Admission 61      Picture Your Plate Scores: <98 Unhealthy dietary pattern with much room for improvement. 41-50 Dietary pattern unlikely to meet recommendations for good health and room for improvement. 51-60 More healthful dietary pattern, with some room for improvement.  >60 Healthy dietary pattern, although there may be some specific behaviors that could be improved.   Nutrition Goals Re-Evaluation:   Nutrition Goals Discharge (Final Nutrition Goals Re-Evaluation):   Psychosocial: Target Goals: Acknowledge presence or absence of significant depression and/or stress, maximize coping skills, provide positive support system. Participant is able to verbalize types and ability to use techniques and skills needed for reducing stress and depression.   Education: Stress, Anxiety, and Depression - Group verbal and visual presentation to define topics covered.  Reviews how body is impacted by stress, anxiety, and depression.  Also discusses healthy ways to reduce stress and to treat/manage anxiety and depression.  Written material given at graduation.   Education: Sleep Hygiene -Provides group verbal and written instruction about how sleep can affect your health.  Define sleep hygiene, discuss sleep cycles and impact of sleep habits. Review good sleep hygiene tips.     Initial Review & Psychosocial Screening:  Initial Psych Review & Screening - 05/04/21 1453       Initial Review   Current issues with None Identified      Family Dynamics   Good Support System? Yes   husband and children  2 nearby  one in [place]     Barriers   Psychosocial barriers to participate in program There are no identifiable barriers or psychosocial needs.      Screening Interventions   Interventions Encouraged to exercise;To provide support and resources with identified psychosocial needs;Provide feedback about the scores to participant    Expected Outcomes Short Term goal: Utilizing psychosocial counselor, staff and physician to assist with identification of specific Stressors or current issues interfering with healing process. Setting desired goal for each stressor or current issue identified.;Long Term Goal: Stressors or current issues are controlled or eliminated.;Short Term goal: Identification and review with participant of any Quality of Life or Depression concerns found by scoring the questionnaire.;Long Term goal: The participant improves quality of Life and PHQ9 Scores as seen by post scores and/or verbalization of changes             Quality of Life Scores:  Scores  of 19 and below usually indicate a poorer quality of life in these areas.  A difference of  2-3 points is a clinically meaningful difference.  A difference of 2-3 points in the total score of the Quality of Life Index has been associated with significant improvement in overall quality of life, self-image, physical symptoms, and general health in studies assessing change in quality of life.  PHQ-9: Recent Review Flowsheet Data     Depression screen [facility] 2/9 05/18/2021 04/13/2021 11/14/2020 04/20/2020 04/12/2020   Decreased Interest 1 0 0 0 0   Down, Depressed, Hopeless 1 0 0 0 0   PHQ - 2 Score 2 0 0 0 0   Altered sleeping 1 - - - -   Tired, decreased energy 2 - - - -   Change in appetite 1 - - - -    Feeling bad or failure about yourself  1 - - - -   Trouble concentrating 1 - - - -   Moving slowly or fidgety/restless 0 - - - -   Suicidal thoughts 0 - - - -   PHQ-9 Score 8 - - - -   Difficult doing work/chores Somewhat difficult - - - -      Interpretation of Total Score  Total Score Depression Severity:  1-4 = Minimal depression, 5-9 = Mild depression, 10-14 = Moderate depression, 15-19 = Moderately severe depression, 20-27 = Severe depression   Psychosocial Evaluation and Intervention:  Psychosocial Evaluation - 05/04/21 1505       Psychosocial Evaluation & Interventions   Interventions Encouraged to exercise with the program and follow exercise prescription    Comments Betty [name] has no barriers to attending the program. She wants to gain back some of her staamina and improve her breathing. She lives with her husband of 42 years. She has him as part of her support, as well as 3 children- 2 nearby and one in [place]. She had been in the program in 2018 and is ready to do what she can to acheive her goals.    Expected Outcomes STG: Betty [name] is able to attend all scheduled sessions. She is able to see improvment in her breathing and stamina. LTG: Betty [name] continues with her exercise progress and symptom control    Continue Psychosocial Services  Follow up required by staff             Psychosocial Re-Evaluation:   Psychosocial Discharge (Final Psychosocial Re-Evaluation):   Education: Education Goals: Education classes will be provided on a weekly basis, covering required topics. Participant will state understanding/return demonstration of topics presented.  Learning Barriers/Preferences:  Learning Barriers/Preferences - 05/04/21 1456       Learning Barriers/Preferences   Learning Barriers None    Learning Preferences None             General Pulmonary Education Topics:  Infection Prevention: - Provides verbal and written material to individual with discussion  of infection control including proper hand washing and proper equipment cleaning during exercise session. Flowsheet Row Pulmonary Rehab from 05/31/2021 in [facility] Cardiac and Pulmonary Rehab  Date 05/18/21  Educator AS  Instruction Review Code 1- Verbalizes Understanding       Falls Prevention: - Provides verbal and written material to individual with discussion of falls prevention and safety. Flowsheet Row Pulmonary Rehab from 05/31/2021 in [facility] Cardiac and Pulmonary Rehab  Date 05/18/21  Educator AS  Instruction Review Code 1- Verbalizes Understanding       Chronic Lung Disease Review: -  Group verbal instruction with posters, models, PowerPoint presentations and videos,  to review new updates, new respiratory medications, new advancements in procedures and treatments. Providing information on websites and "800" numbers for continued self-education. Includes information about supplement oxygen, available portable oxygen systems, continuous and intermittent flow rates, oxygen safety, concentrators, and Medicare reimbursement for oxygen. Explanation of Pulmonary Drugs, including class, frequency, complications, importance of spacers, rinsing mouth after steroid MDI's, and proper cleaning methods for nebulizers. Review of basic lung anatomy and physiology related to function, structure, and complications of lung disease. Review of risk factors. Discussion about methods for diagnosing sleep apnea and types of masks and machines for OSA. Includes a review of the use of types of environmental controls: home humidity, furnaces, filters, dust mite/pet prevention, HEPA vacuums. Discussion about weather changes, air quality and the benefits of nasal washing. Instruction on Warning signs, infection symptoms, calling MD promptly, preventive modes, and value of vaccinations. Review of effective airway clearance, coughing and/or vibration techniques. Emphasizing that all should Create an Action Plan. Written  material given at graduation. Flowsheet Row Pulmonary Rehab from 05/31/2021 in [facility] Cardiac and Pulmonary Rehab  Date 05/31/21  Educator [facility]  Instruction Review Code 1- Verbalizes Understanding       AED/CPR: - Group verbal and written instruction with the use of models to demonstrate the basic use of the AED with the basic ABC's of resuscitation.    Anatomy and Cardiac Procedures: - Group verbal and visual presentation and models provide information about basic cardiac anatomy and function. Reviews the testing methods done to diagnose heart disease and the outcomes of the test results. Describes the treatment choices: Medical Management, Angioplasty, or Coronary Bypass Surgery for treating various heart conditions including Myocardial Infarction, Angina, Valve Disease, and Cardiac Arrhythmias.  Written material given at graduation.   Medication Safety: - Group verbal and visual instruction to review commonly prescribed medications for heart and lung disease. Reviews the medication, class of the drug, and side effects. Includes the steps to properly store meds and maintain the prescription regimen.  Written material given at graduation.   Other: -Provides group and verbal instruction on various topics (see comments)   Knowledge Questionnaire Score:  Knowledge Questionnaire Score - 05/18/21 1555       Knowledge Questionnaire Score   Pre Score 15/18              Core Components/Risk Factors/Patient Goals at Admission:  Personal Goals and Risk Factors at Admission - 05/18/21 1600       Core Components/Risk Factors/Patient Goals on Admission    Weight Management Yes    Intervention Weight Management: Develop a combined nutrition and exercise program designed to reach desired caloric intake, while maintaining appropriate intake of nutrient and fiber, sodium and fats, and appropriate energy expenditure required for the weight goal.;Weight Management/Obesity: Establish reasonable  short term and long term weight goals.    Admit Weight 204 lb (92.5 kg)    Goal Weight: Short Term 200 lb (90.7 kg)    Goal Weight: Long Term 150 lb (68 kg)    Expected Outcomes Short Term: Continue to assess and modify interventions until short term weight is achieved;Long Term: Adherence to nutrition and physical activity/exercise program aimed toward attainment of established weight goal;Weight Loss: Understanding of general recommendations for a balanced deficit meal plan, which promotes 1-2 lb weight loss per week and includes a negative energy balance of [phone] kcal/d    Improve shortness of breath with ADL's Yes  Intervention Provide education, individualized exercise plan and daily activity instruction to help decrease symptoms of SOB with activities of daily living.    Expected Outcomes Short Term: Improve cardiorespiratory fitness to achieve a reduction of symptoms when performing ADLs;Long Term: Be able to perform more ADLs without symptoms or delay the onset of symptoms    Intervention Provide education, demonstration and support about specific breathing techniuqes utilized for more efficient breathing. Include techniques such as pursed lipped breathing, diaphragmatic breathing and self-pacing activity.    Expected Outcomes Short Term: Participant will be able to demonstrate and use breathing techniques as needed throughout daily activities.    Increase knowledge of respiratory medications and ability to use respiratory devices properly  Yes    Intervention Provide education and demonstration as needed of appropriate use of medications, inhalers, and oxygen therapy.    Expected Outcomes Short Term: Achieves understanding of medications use. Understands that oxygen is a medication prescribed by physician. Demonstrates appropriate use of inhaler and oxygen therapy.;Long Term: Maintain appropriate use of medications, inhalers, and oxygen therapy.    Diabetes Yes    Intervention Provide  education about signs/symptoms and action to take for hypo/hyperglycemia.;Provide education about proper nutrition, including hydration, and aerobic/resistive exercise prescription along with prescribed medications to achieve blood glucose in normal ranges: Fasting glucose 65-99 mg/dL    Expected Outcomes Short Term: Participant verbalizes understanding of the signs/symptoms and immediate care of hyper/hypoglycemia, proper foot care and importance of medication, aerobic/resistive exercise and nutrition plan for blood glucose control.;Long Term: Attainment of HbA1C < 7%.    Heart Failure Yes    Intervention Provide a combined exercise and nutrition program that is supplemented with education, support and counseling about heart failure. Directed toward relieving symptoms such as shortness of breath, decreased exercise tolerance, and extremity edema.    Expected Outcomes Improve functional capacity of life;Short term: Attendance in program 2-3 days a week with increased exercise capacity. Reported lower sodium intake. Reported increased fruit and vegetable intake. Reports medication compliance.;Short term: Daily weights obtained and reported for increase. Utilizing diuretic protocols set by physician.;Long term: Adoption of self-care skills and reduction of barriers for early signs and symptoms recognition and intervention leading to self-care maintenance.    Hypertension Yes    Intervention Provide education on lifestyle modifcations including regular physical activity/exercise, weight management, moderate sodium restriction and increased consumption of fresh fruit, vegetables, and low fat dairy, alcohol moderation, and smoking cessation.;Monitor prescription use compliance.    Expected Outcomes Short Term: Continued assessment and intervention until BP is < 140/72mm HG in hypertensive participants. < 130/72mm HG in hypertensive participants with diabetes, heart failure or chronic kidney disease.;Long Term:  Maintenance of blood pressure at goal levels.    Lipids Yes    Intervention Provide education and support for participant on nutrition & aerobic/resistive exercise along with prescribed medications to achieve LDL 70mg , HDL >40mg .    Expected Outcomes Short Term: Participant states understanding of desired cholesterol values and is compliant with medications prescribed. Participant is following exercise prescription and nutrition guidelines.;Long Term: Cholesterol controlled with medications as prescribed, with individualized exercise RX and with personalized nutrition plan. Value goals: LDL < 70mg , HDL > 40 mg.             Education:Diabetes - Individual verbal and written instruction to review signs/symptoms of diabetes, desired ranges of glucose level fasting, after meals and with exercise. Acknowledge that pre and post exercise glucose checks will be done for 3 sessions at entry of  program. Flowsheet Row Pulmonary Rehab from 05/31/2021 in [facility] Cardiac and Pulmonary Rehab  Date 05/18/21  Educator AS  Instruction Review Code 1- Verbalizes Understanding       Know Your Numbers and Heart Failure: - Group verbal and visual instruction to discuss disease risk factors for cardiac and pulmonary disease and treatment options.  Reviews associated critical values for Overweight/Obesity, Hypertension, Cholesterol, and Diabetes.  Discusses basics of heart failure: signs/symptoms and treatments.  Introduces Heart Failure Zone chart for action plan for heart failure.  Written material given at graduation.   Core Components/Risk Factors/Patient Goals Review:    Core Components/Risk Factors/Patient Goals at Discharge (Final Review):    ITP Comments:  ITP Comments     Row Name 05/04/21 1511 05/18/21 1606 05/29/21 0954 05/31/21 1429     ITP Comments Virtual orientation call completed today. shehas an appointment on Date: 05/18/2021  for EP eval and gym Orientation.  Documentation of diagnosis can  be found in [facility] Date:   02/07/2021 Completed 6MWT and gym orientation. Initial ITP created and sent for review to Dr. Ottie Glazier, Medical Director. First full day of exercise!  Patient was oriented to gym and equipment including functions, settings, policies, and procedures.  Patient's individual exercise prescription and treatment plan were reviewed.  All starting workloads were established based on the results of the 6 minute walk test done at initial orientation visit.  The plan for exercise progression was also introduced and progression will be customized based on patient's performance and goals. 30 day review completed. ITP sent to Dr. Zetta Bills, Medical Director of  Pulmonary Rehab. Continue with ITP unless changes are made by physician.             Comments: 30 day review

## 2021-05-31 NOTE — Progress Notes (Signed)
Daily Session Note  Patient Details  Name: Betty [name] MRN: [id number] Date of Birth: 1945/09/22 Referring Provider:   April Manson Pulmonary Rehab from 05/18/2021 in [facility] Cardiac and Pulmonary Rehab  Referring Provider [name]       Encounter Date: 05/31/2021  Check In:  Session Check In - 05/31/21 1000       Check-In   Supervising physician immediately available to respond to emergencies See telemetry face sheet for immediately available ER MD    Location [facility]-Cardiac & Pulmonary Rehab    Staff Present [name], MPA, RN;Melissa Harmony, RDN, Rowe Pavy, BA, ACSM CEP, Exercise Physiologist    Virtual Visit No    Medication changes reported     No    Fall or balance concerns reported    No    Warm-up and Cool-down Performed on first and last piece of equipment    Resistance Training Performed Yes    VAD Patient? No    PAD/SET Patient? No      Pain Assessment   Currently in Pain? No/denies                Social History   Tobacco Use  Smoking Status Never  Smokeless Tobacco Never    Goals Met:  Independence with exercise equipment Exercise tolerated well No report of concerns or symptoms today Strength training completed today  Goals Unmet:  Not Applicable  Comments: Pt able to follow exercise prescription today without complaint.  Will continue to monitor for progression.    Dr. Emily Filbert is Medical Director for [place].  Dr. Ottie Glazier is Medical Director for [facility] Pulmonary Rehabilitation.

## 2021-06-02 ENCOUNTER — Encounter: Payer: Medicare PPO | Admitting: *Deleted

## 2021-06-02 ENCOUNTER — Other Ambulatory Visit: Payer: Self-pay

## 2021-06-02 DIAGNOSIS — J984 Other disorders of lung: Secondary | ICD-10-CM | POA: Diagnosis not present

## 2021-06-02 LAB — GLUCOSE, CAPILLARY
Glucose-Capillary: 218 mg/dL — ABNORMAL HIGH (ref 70–99)
Glucose-Capillary: 134 mg/dL — ABNORMAL HIGH (ref 70–99)

## 2021-06-02 NOTE — Progress Notes (Signed)
Daily Session Note  Patient Details  Name: Betty [name] MRN: [id number] Date of Birth: 02/24/1946 Referring Provider:   April Manson Pulmonary Rehab from 05/18/2021 in [facility] Cardiac and Pulmonary Rehab  Referring Provider [name]       Encounter Date: 06/02/2021  Check In:  Session Check In - 06/02/21 0951       Check-In   Supervising physician immediately available to respond to emergencies See telemetry face sheet for immediately available ER MD    Location [facility]-Cardiac & Pulmonary Rehab    Staff Present Renita Papa, RN BSN;Joseph Comanche, RCP,RRT,BSRT;Jessica [place], [place], RCEP, CCRP, CCET    Virtual Visit No    Medication changes reported     No    Fall or balance concerns reported    No    Warm-up and Cool-down Performed on first and last piece of equipment    Resistance Training Performed Yes    VAD Patient? No    PAD/SET Patient? No      Pain Assessment   Currently in Pain? No/denies                Social History   Tobacco Use  Smoking Status Never  Smokeless Tobacco Never    Goals Met:  Independence with exercise equipment Exercise tolerated well No report of concerns or symptoms today Strength training completed today  Goals Unmet:  Not Applicable  Comments: Pt able to follow exercise prescription today without complaint.  Will continue to monitor for progression.    Dr. Emily Filbert is Medical Director for [place].  Dr. Ottie Glazier is Medical Director for [facility] Pulmonary Rehabilitation.

## 2021-06-03 DIAGNOSIS — J9610 Chronic respiratory failure, unspecified whether with hypoxia or hypercapnia: Secondary | ICD-10-CM | POA: Diagnosis not present

## 2021-06-05 ENCOUNTER — Other Ambulatory Visit: Payer: Self-pay

## 2021-06-05 DIAGNOSIS — J984 Other disorders of lung: Secondary | ICD-10-CM | POA: Diagnosis not present

## 2021-06-05 NOTE — Progress Notes (Signed)
Daily Session Note  Patient Details  Name: Betty [name] MRN: [id number] Date of Birth: 08/16/1946 Referring Provider:   April Manson Pulmonary Rehab from 05/18/2021 in [facility] Cardiac and Pulmonary Rehab  Referring Provider [name]       Encounter Date: 06/05/2021  Check In:  Session Check In - 06/05/21 0957       Check-In   Supervising physician immediately available to respond to emergencies See telemetry face sheet for immediately available ER MD    Location [facility]-Cardiac & Pulmonary Rehab    Staff Present Will Bonnet, RN,BC,MSN;Kelly Amedeo Plenty, BS, ACSM CEP, Exercise Physiologist;Amanda Oletta Darter, [place], ACSM CEP, Exercise Physiologist    Virtual Visit No    Medication changes reported     No    Fall or balance concerns reported    No    Warm-up and Cool-down Performed on first and last piece of equipment    Resistance Training Performed Yes    VAD Patient? No    PAD/SET Patient? No      Pain Assessment   Currently in Pain? No/denies    Multiple Pain Sites No                Social History   Tobacco Use  Smoking Status Never  Smokeless Tobacco Never    Goals Met:  Independence with exercise equipment Exercise tolerated well No report of concerns or symptoms today Strength training completed today  Goals Unmet:  Not Applicable  Comments: Pt able to follow exercise prescription today without complaint.  Will continue to monitor for progression.    Dr. Emily Filbert is Medical Director for [place].  Dr. Ottie Glazier is Medical Director for [facility] Pulmonary Rehabilitation.

## 2021-06-06 DIAGNOSIS — L570 Actinic keratosis: Secondary | ICD-10-CM | POA: Diagnosis not present

## 2021-06-07 ENCOUNTER — Other Ambulatory Visit: Payer: Self-pay

## 2021-06-07 DIAGNOSIS — J984 Other disorders of lung: Secondary | ICD-10-CM

## 2021-06-07 NOTE — Progress Notes (Signed)
Daily Session Note  Patient Details  Name: Betty [name] MRN: [id number] Date of Birth: 07/04/1946 Referring Provider:   Flowsheet Row Pulmonary Rehab from 05/18/2021 in [facility] Cardiac and Pulmonary Rehab  Referring Provider [name]       Encounter Date: 06/07/2021  Check In:  Session Check In - 06/07/21 1012       Check-In   Supervising physician immediately available to respond to emergencies See telemetry face sheet for immediately available ER MD    Location [facility]-Cardiac & Pulmonary Rehab    Staff Present Kelly Bollinger, MPA, RN;Amanda Sommer, BA, ACSM CEP, Exercise Physiologist;Joseph Hood, RCP,RRT,BSRT    Virtual Visit No    Medication changes reported     No    Fall or balance concerns reported    No    Warm-up and Cool-down Performed on first and last piece of equipment    Resistance Training Performed Yes    VAD Patient? No    PAD/SET Patient? No      Pain Assessment   Currently in Pain? No/denies                Social History   Tobacco Use  Smoking Status Never  Smokeless Tobacco Never    Goals Met:  Independence with exercise equipment Exercise tolerated well No report of concerns or symptoms today Strength training completed today  Goals Unmet:  Not Applicable  Comments: Pt able to follow exercise prescription today without complaint.  Will continue to monitor for progression.    Dr. Mark Miller is Medical Director for [facility].  Dr. Fuad Aleskerov is Medical Director for [facility] Pulmonary Rehabilitation. 

## 2021-06-12 ENCOUNTER — Encounter: Payer: Self-pay | Admitting: Family Medicine

## 2021-06-12 MED ORDER — MOLNUPIRAVIR EUA 200MG CAPSULE: 4.0000 | ORAL_CAPSULE | Freq: Two times a day (BID) | ORAL | 0 refills | Status: AC

## 2021-06-12 NOTE — Telephone Encounter (Signed)
Please make sure the patient got the MyChart message.  Please also advise that she needs to remain hydrated.  If she starts to feel dehydrated she needs to be evaluated as well.  Can you please also see if she can check her oxygen at home?  If it is less than 90% she needs to go to the emergency room.

## 2021-06-12 NOTE — Telephone Encounter (Signed)
This needs to be triaged. I would need a lot more details than this to help determine what evaluation or treatment would be needed. What symptoms is she having? When did they start? What treatments has she tried to this point? There needs to be a separate note for the patients husband with the same questions asked and answered. I will like advise that they need to complete at least a virtual visit for this, though please get the answers to these questions first.

## 2021-06-12 NOTE — Telephone Encounter (Signed)
This patient stated her symptoms started Friday night, the symptoms are runny nose, headache, cough, and weakness no fever.  She has taken Claritin, delsym and tylenol so far. Betty [name],cma

## 2021-06-14 ENCOUNTER — Telehealth (INDEPENDENT_AMBULATORY_CARE_PROVIDER_SITE_OTHER): Payer: Medicare PPO | Admitting: Family Medicine

## 2021-06-14 ENCOUNTER — Other Ambulatory Visit: Payer: Self-pay

## 2021-06-14 DIAGNOSIS — U071 COVID-19: Secondary | ICD-10-CM | POA: Insufficient documentation

## 2021-06-14 NOTE — Assessment & Plan Note (Signed)
Patient with COVID-19.  Symptoms seem to be relatively mild.  She did develop diarrhea after starting on molnupiravir.  She subsequently discontinued that medication.  The diarrhea seems to be improving today.  I discussed adequate hydration.  Discussed signs of dehydration and advised to seek medical attention in the emergency department if those occurred.  Advised she could treat supportively with Tylenol for any discomfort or fevers.  Discussed she could use Delsym as needed as she has tolerated that for cough.  Advised to contact us with any concerns or worsening symptoms.  Advised on quarantine through 06/19/2021.

## 2021-06-14 NOTE — Progress Notes (Signed)
Virtual Visit via telephone Note  This visit type was conducted due to national recommendations for restrictions regarding the COVID-19 pandemic (e.g. social distancing).  This format is felt to be most appropriate for this patient at this time.  All issues noted in this document were discussed and addressed.  No physical exam was performed (except for noted visual exam findings with Video Visits).   I connected with Betty [name] today at  1:45 PM EDT by telephone and verified that I am speaking with the correct person using two identifiers. Location patient: home Location provider: work  Persons participating in the virtual visit: patient, provider  I discussed the limitations, risks, security and privacy concerns of performing an evaluation and management service by telephone and the availability of in person appointments. I also discussed with the patient that there may be a patient responsible charge related to this service. The patient expressed understanding and agreed to proceed.  Interactive audio and video telecommunications were attempted between this provider and patient, however failed, due to patient having technical difficulties OR patient did not have access to video capability.  We continued and completed visit with audio only.   Reason for visit: same day visit  HPI: COVID-19: Patient notes onset of symptoms on 10/14.  She tested positive on 10/16.  Symptoms include congestion, rhinorrhea, fatigue, decreased appetite.  She had a slight fever on 10/16.  She has had no change to her chronic shortness of breath.  I started her on molnupiravir the evening of the 17th.  She developed liquid stools the next day.  She only took 2 doses of the antiviral before stopping this related to the diarrhea.  She had 4 episodes of diarrhea with no blood.  She notes no abdominal pain, nausea, or vomiting.  Her urine is darker yellow than usual.  She is urinating 5-6 times a day.  She has only had  1 episode of diarrhea today.  She also decreased her metformin dose to 1 tablet in the morning and 1 tablet in the evening.   ROS: See pertinent positives and negatives per HPI.  Past Medical History:  Diagnosis Date   Allergic rhinitis    never tested, fall and spring   Arthritis    hands,knees, feet   Cataracts, bilateral    not surgical yet   CHOLECYSTECTOMY, HX OF 10/08/2007   Qualifier: Diagnosis of  By: Council Mechanic MD, [name]    Chronic diastolic CHF (congestive heart failure) (HCC)    Chronic respiratory failure ([place])    Diabetes mellitus without complication ([place])    Diverticulosis    problems with frequent gas, burping   Glucose intolerance (impaired glucose tolerance)    Gout    History of chicken pox    Hyperlipidemia    Hypertension    NICM (nonischemic cardiomyopathy) ([place]) 2002   EF 25%; improved to normal - echo 4/08: EF 50-55%, mild MR, mild LAE, mild TR;    cath 3/03: normal cors, EF 40%   Obesity    Oxygen deficiency 2017   at night   PONV (postoperative nausea and vomiting)    after wisdom tooth extraction   Restrictive lung disease    Skin cancer    skin cancers only   Sleep apnea    cpap settings 4    Past Surgical History:  Procedure Laterality Date   BILATERAL VATS ABLATION Right    biopsy done 2015- [facility]   BREAST EXCISIONAL BIOPSY Left 80's   NEG  CARDIAC CATHETERIZATION     CATARACT EXTRACTION W/PHACO Right 06/21/2020   Procedure: CATARACT EXTRACTION PHACO AND INTRAOCULAR LENS PLACEMENT ([place]) RIGHT DIABETIC;  Surgeon: Birder Robson, MD;  Location: [place];  Service: Ophthalmology;  Laterality: Right;  4.06 0:32.8   CATARACT EXTRACTION W/PHACO Left 07/12/2020   Procedure: CATARACT EXTRACTION PHACO AND INTRAOCULAR LENS PLACEMENT ([facility]) LEFT DIABETIC 6.96 00:46.3;  Surgeon: Birder Robson, MD;  Location: [place];  Service: Ophthalmology;  Laterality: Left;  Diabetic - oral meds   CHOLECYSTECTOMY      choleystectomy  02/2002   COLONOSCOPY WITH PROPOFOL N/A 12/20/2014   Procedure: COLONOSCOPY WITH PROPOFOL;  Surgeon: [name], MD;  Location: [facility];  Service: Gastroenterology;  Laterality: N/A;   COLONOSCOPY WITH PROPOFOL N/A 03/22/2020   Procedure: COLONOSCOPY WITH PROPOFOL;  Surgeon: [name], MD;  Location: [facility];  Service: Gastroenterology;  Laterality: N/A;   CYSTECTOMY  1996   l breast   DILATION AND CURETTAGE OF UTERUS  2011   Dr. Hulan Fray at [place]     x 2   POLYPECTOMY  03/22/2020   Procedure: POLYPECTOMY;  Surgeon: [name], MD;  Location: [facility];  Service: Gastroenterology;;   [place]     x2    Family History  Problem Relation Age of Onset   Lymphoma Mother 58   COPD Father        was a smoker   Stroke Father    Pneumonia Father    Diabetes Father    Hyperlipidemia Father    Heart disease Father    Heart attack Father    Hypertension Father    Diabetes Sister    Depression Sister    Meniere's disease Brother    Diabetes Paternal Grandfather    Heart disease Paternal Grandfather    Schizophrenia Daughter    Bladder Cancer Maternal Grandmother    Leukemia Maternal Grandfather    Hypertension Brother    Colon cancer Neg Hx    Stomach cancer Neg Hx    Breast cancer Neg Hx    Colon polyps Neg Hx    Esophageal cancer Neg Hx    Rectal cancer Neg Hx     SOCIAL HX: Non-smoker   Current Outpatient Medications:    acetic acid 2 % otic solution, Insert saturated wick of cotton; keep moist 24 hours by adding 3 to 5 drops every 4 to 6 hours; remove wick after 24 hours and instill 5 drops 3 to 4 times daily, Disp: 15 mL, Rfl: 0   albuterol (VENTOLIN HFA) 108 (90 Base) MCG/ACT inhaler, Inhale 2 puffs into the lungs every 4 (four) hours as needed for wheezing or shortness of breath., Disp: 8 g, Rfl: 6   aspirin 81 MG EC tablet, Take 1 tablet (81 mg total) by mouth daily.,  Disp: 90 tablet, Rfl: 2   CALCIUM-MAGNESIUM-VITAMIN D PO, Take 1 tablet by mouth daily., Disp: , Rfl:    carvedilol (COREG) 12.5 MG tablet, TAKE 1 TABLET BY MOUTH TWICE A DAY WITH MEALS, Disp: 60 tablet, Rfl: 11   clobetasol (TEMOVATE) 0.05 % GEL, Apply topically 2 (two) times daily., Disp: , Rfl:    Coenzyme Q10 (CO Q 10 PO), Take 200 mg by mouth at bedtime. , Disp: , Rfl:    Colchicine (MITIGARE) 0.6 MG CAPS, Day 1: take 1.2 mg (2 tablets) by mouth at the first  sign of flare, followed by 0.6 mg (1 tablet) by mouth after 1 hour. Day 2 and thereafter: take 0.6 mg (1 tablet) by mouth daily until flare has resolved., Disp: 30 capsule, Rfl: 0   Fish Oil OIL, Take 1,200 mg by mouth 2 (two) times daily. GUMMIES, Disp: , Rfl:    fluticasone (FLONASE) 50 MCG/ACT nasal spray, Place 2 sprays into both nostrils daily., Disp: 16 g, Rfl: 6   losartan (COZAAR) 50 MG tablet, TAKE 1/2 TABLET BY MOUTH DAILY, Disp: 45 tablet, Rfl: 3   metFORMIN (GLUCOPHAGE XR) 500 MG 24 hr tablet, Take 2 tablets (1,000 mg total) by mouth 2 (two) times daily with a meal., Disp: 360 tablet, Rfl: 1   molnupiravir EUA (LAGEVRIO) 200 mg CAPS capsule, Take 4 capsules (800 mg total) by mouth 2 (two) times daily for 5 days., Disp: 40 capsule, Rfl: 0   multivitamin (THERAGRAN) per tablet, Take 1 tablet by mouth daily., Disp: , Rfl:    nystatin cream (MYCOSTATIN), Apply 1 application topically 2 (two) times daily., Disp: 30 g, Rfl: 0   polyvinyl alcohol (LIQUIFILM TEARS) 1.4 % ophthalmic solution, Place 1 drop into both eyes every morning., Disp: , Rfl:    pravastatin (PRAVACHOL) 20 MG tablet, Take 1 tablet (20 mg total) by mouth daily., Disp: 90 tablet, Rfl: 1  EXAM: This was a telephone visit and thus no physical exam was completed.  ASSESSMENT AND PLAN:  Discussed the following assessment and plan:  Problem List Items Addressed This Visit     COVID-19    Patient with COVID-19.  Symptoms seem to be relatively mild.  She did develop  diarrhea after starting on molnupiravir.  She subsequently discontinued that medication.  The diarrhea seems to be improving today.  I discussed adequate hydration.  Discussed signs of dehydration and advised to seek medical attention in the emergency department if those occurred.  Advised she could treat supportively with Tylenol for any discomfort or fevers.  Discussed she could use Delsym as needed as she has tolerated that for cough.  Advised to contact us with any concerns or worsening symptoms.  Advised on quarantine through 06/19/2021.       Return if symptoms worsen or fail to improve.   I discussed the assessment and treatment plan with the patient. The patient was provided an opportunity to ask questions and all were answered. The patient agreed with the plan and demonstrated an understanding of the instructions.   The patient was advised to call back or seek an in-person evaluation if the symptoms worsen or if the condition fails to improve as anticipated.  I provided 9 minutes of non-face-to-face time during this encounter.   Tommi Rumps, MD

## 2021-06-14 NOTE — Telephone Encounter (Signed)
At this point it would be best if she was able to complete a virtual visit with me or anyone else in clinic or though mychart. She needs to have an evaluation to determine if she needs to be seen in person or monitored at home as this can't be adequately determined through mychart. Please call her an advise her of this. Thanks.

## 2021-06-15 NOTE — Telephone Encounter (Signed)
Patient was scheduled and seen by the provider. Valynn Schamberger,cma

## 2021-06-19 ENCOUNTER — Encounter: Payer: Self-pay | Admitting: *Deleted

## 2021-06-19 DIAGNOSIS — J984 Other disorders of lung: Secondary | ICD-10-CM

## 2021-06-22 ENCOUNTER — Telehealth: Payer: Self-pay

## 2021-06-22 NOTE — Telephone Encounter (Signed)
Spoke with patient, she feels much better and is ready to start exercise again. Patient will be back to Pulmonary Rehab on 10/31.

## 2021-06-26 ENCOUNTER — Encounter: Payer: Medicare PPO | Admitting: *Deleted

## 2021-06-26 ENCOUNTER — Other Ambulatory Visit: Payer: Self-pay

## 2021-06-26 DIAGNOSIS — J984 Other disorders of lung: Secondary | ICD-10-CM

## 2021-06-26 LAB — GLUCOSE, CAPILLARY
Glucose-Capillary: 160 mg/dL — ABNORMAL HIGH (ref 70–99)
Glucose-Capillary: 103 mg/dL — ABNORMAL HIGH (ref 70–99)

## 2021-06-26 NOTE — Progress Notes (Signed)
Daily Session Note  Patient Details  Name: Betty [name] MRN: [id number] Date of Birth: 1945/09/09 Referring Provider:   April Manson [facility] from 05/18/2021 in [facility]  Referring Provider [name]       Encounter Date: 06/26/2021  Check In:  Session Check In - 06/26/21 1012       Check-In   Supervising physician immediately available to respond to emergencies See telemetry face sheet for immediately available ER MD    Location [facility]    Staff Present Heath Lark, RN, BSN, CCRP;Laureen Owens Shark, BS, RRT, CPFT;Meredith Sherryll Burger, RN Moises Blood, BS, ACSM CEP, Exercise Physiologist    Virtual Visit No    Medication changes reported     No    Fall or balance concerns reported    No    Warm-up and Cool-down Performed on first and last piece of equipment    Resistance Training Performed Yes    VAD Patient? No    PAD/SET Patient? No                Social History   Tobacco Use  Smoking Status Never  Smokeless Tobacco Never    Goals Met:  Proper associated with RPD/PD & O2 Sat Independence with exercise equipment Exercise tolerated well Personal goals reviewed No report of concerns or symptoms today  Goals Unmet:  Not Applicable  Comments: Pt able to follow exercise prescription today without complaint.  Will continue to monitor for progression.    Dr. Emily Filbert is Medical Director for [facility].  Dr. Ottie Glazier is Medical Director for [facility].

## 2021-06-28 ENCOUNTER — Encounter: Payer: Self-pay | Admitting: *Deleted

## 2021-06-28 DIAGNOSIS — J984 Other disorders of lung: Secondary | ICD-10-CM

## 2021-06-28 NOTE — Progress Notes (Signed)
Pulmonary Individual Treatment Plan  Patient Details  Name: Betty [name] MRN: [id number] Date of Birth: [birth year] Referring Provider:   April Manson Pulmonary Rehab from 05/18/2021 in [facility] Cardiac and Pulmonary Rehab  Referring Provider [name]       Initial Encounter Date:  Flowsheet Row Pulmonary Rehab from 05/18/2021 in [facility] Cardiac and Pulmonary Rehab  Date 05/18/21       Visit Diagnosis: Restrictive lung disease  Patient's Home Medications on Admission:  Current Outpatient Medications:    acetic acid 2 % otic solution, Insert saturated wick of cotton; keep moist 24 hours by adding 3 to 5 drops every 4 to 6 hours; remove wick after 24 hours and instill 5 drops 3 to 4 times daily, Disp: 15 mL, Rfl: 0   albuterol (VENTOLIN HFA) 108 (90 Base) MCG/ACT inhaler, Inhale 2 puffs into the lungs every 4 (four) hours as needed for wheezing or shortness of breath., Disp: 8 g, Rfl: 6   aspirin 81 MG EC tablet, Take 1 tablet (81 mg total) by mouth daily., Disp: 90 tablet, Rfl: 2   CALCIUM-MAGNESIUM-VITAMIN D PO, Take 1 tablet by mouth daily., Disp: , Rfl:    carvedilol (COREG) 12.5 MG tablet, TAKE 1 TABLET BY MOUTH TWICE A DAY WITH MEALS, Disp: 60 tablet, Rfl: 11   clobetasol (TEMOVATE) 0.05 % GEL, Apply topically 2 (two) times daily., Disp: , Rfl:    Coenzyme Q10 (CO Q 10 PO), Take 200 mg by mouth at bedtime. , Disp: , Rfl:    Colchicine (MITIGARE) 0.6 MG CAPS, Day 1: take 1.2 mg (2 tablets) by mouth at the first sign of flare, followed by 0.6 mg (1 tablet) by mouth after 1 hour. Day 2 and thereafter: take 0.6 mg (1 tablet) by mouth daily until flare has resolved., Disp: 30 capsule, Rfl: 0   Fish Oil OIL, Take 1,200 mg by mouth 2 (two) times daily. GUMMIES, Disp: , Rfl:    fluticasone (FLONASE) 50 MCG/ACT nasal spray, Place 2 sprays into both nostrils daily., Disp: 16 g, Rfl: 6   losartan (COZAAR) 50 MG tablet, TAKE 1/2 TABLET BY MOUTH DAILY, Disp: 45 tablet, Rfl: 3   metFORMIN (GLUCOPHAGE  XR) 500 MG 24 hr tablet, Take 2 tablets (1,000 mg total) by mouth 2 (two) times daily with a meal., Disp: 360 tablet, Rfl: 1   multivitamin (THERAGRAN) per tablet, Take 1 tablet by mouth daily., Disp: , Rfl:    nystatin cream (MYCOSTATIN), Apply 1 application topically 2 (two) times daily., Disp: 30 g, Rfl: 0   polyvinyl alcohol (LIQUIFILM TEARS) 1.4 % ophthalmic solution, Place 1 drop into both eyes every morning., Disp: , Rfl:    pravastatin (PRAVACHOL) 20 MG tablet, Take 1 tablet (20 mg total) by mouth daily., Disp: 90 tablet, Rfl: 1  Past Medical History: Past Medical History:  Diagnosis Date   Allergic rhinitis    never tested, fall and spring   Arthritis    hands,knees, feet   Cataracts, bilateral    not surgical yet   CHOLECYSTECTOMY, HX OF 10/08/2007   Qualifier: Diagnosis of  By: Council Mechanic MD, [name]    Chronic diastolic CHF (congestive heart failure) (HCC)    Chronic respiratory failure ([place])    Diabetes mellitus without complication ([place])    Diverticulosis    problems with frequent gas, burping   Glucose intolerance (impaired glucose tolerance)    Gout    History of chicken pox    Hyperlipidemia    Hypertension  NICM (nonischemic cardiomyopathy) ([place]) 2002   EF 25%; improved to normal - echo 4/08: EF 50-55%, mild MR, mild LAE, mild TR;    cath 3/03: normal cors, EF 40%   Obesity    Oxygen deficiency 2017   at night   PONV (postoperative nausea and vomiting)    after wisdom tooth extraction   Restrictive lung disease    Skin cancer    skin cancers only   Sleep apnea    cpap settings 4    Tobacco Use: Social History   Tobacco Use  Smoking Status Never  Smokeless Tobacco Never    Labs: Recent Review Flowsheet Data     Labs for ITP Cardiac and Pulmonary Rehab Latest Ref Rng & Units 11/14/2020 12/26/2020 02/21/2021 04/11/2021 05/17/2021   Cholestrol 0 - 200 mg/dL - - - - -   LDLCALC 0 - 99 mg/dL - - - - -   LDLDIRECT mg/dL 114.0 86.0 79.0 63.0 -   HDL  >39.00 mg/dL - - - - -   Trlycerides 0.0 - 149.0 mg/dL - - - - -   Hemoglobin A1c 4.6 - 6.5 % 6.9(H) - - - 7.2(H)   HCO3 20.0 - 24.0 mEq/L - - - - -   TCO2 0 - 100 mmol/L - - - - -   O2SAT % - - - - -        Pulmonary Assessment Scores:  Pulmonary Assessment Scores     Row Name 05/18/21 1557         ADL UCSD   ADL Phase Entry     SOB Score total 51     Rest 0     Walk 2     Stairs 2     Bath 1     Dress 1     Shop 3       CAT Score   CAT Score 15       mMRC Score   mMRC Score 2              UCSD: Self-administered rating of dyspnea associated with activities of daily living (ADLs) 6-point scale (0 = "not at all" to 5 = "maximal or unable to do because of breathlessness")  Scoring Scores range from 0 to 120.  Minimally important difference is 5 units  CAT: CAT can identify the health impairment of COPD patients and is better correlated with disease progression.  CAT has a scoring range of zero to 40. The CAT score is classified into four groups of low (less than 10), medium (10 - 20), high (21-30) and very high (31-40) based on the impact level of disease on health status. A CAT score over 10 suggests significant symptoms.  A worsening CAT score could be explained by an exacerbation, poor medication adherence, poor inhaler technique, or progression of COPD or comorbid conditions.  CAT MCID is 2 points  mMRC: mMRC (Modified Medical Research Council) Dyspnea Scale is used to assess the degree of baseline functional disability in patients of respiratory disease due to dyspnea. No minimal important difference is established. A decrease in score of 1 point or greater is considered a positive change.   Pulmonary Function Assessment:   Exercise Target Goals: Exercise Program Goal: Individual exercise prescription set using results from initial 6 min walk test and THRR while considering  patient's activity barriers and safety.   Exercise Prescription Goal: Initial  exercise prescription builds to 30-45 minutes a day of aerobic activity, 2-3  days per week.  Home exercise guidelines will be given to patient during program as part of exercise prescription that the participant will acknowledge.  Education: Aerobic Exercise: - Group verbal and visual presentation on the components of exercise prescription. Introduces F.I.T.T principle from ACSM for exercise prescriptions.  Reviews F.I.T.T. principles of aerobic exercise including progression. Written material given at graduation.   Education: Resistance Exercise: - Group verbal and visual presentation on the components of exercise prescription. Introduces F.I.T.T principle from ACSM for exercise prescriptions  Reviews F.I.T.T. principles of resistance exercise including progression. Written material given at graduation.    Education: Exercise & Equipment Safety: - Individual verbal instruction and demonstration of equipment use and safety with use of the equipment. Flowsheet Row Pulmonary Rehab from 05/31/2021 in [facility] Cardiac and Pulmonary Rehab  Date 05/18/21  Educator AS  Instruction Review Code 1- Verbalizes Understanding       Education: Exercise Physiology & General Exercise Guidelines: - Group verbal and written instruction with models to review the exercise physiology of the cardiovascular system and associated critical values. Provides general exercise guidelines with specific guidelines to those with heart or lung disease.    Education: Flexibility, Balance, Mind/Body Relaxation: - Group verbal and visual presentation with interactive activity on the components of exercise prescription. Introduces F.I.T.T principle from ACSM for exercise prescriptions. Reviews F.I.T.T. principles of flexibility and balance exercise training including progression. Also discusses the mind body connection.  Reviews various relaxation techniques to help reduce and manage stress (i.e. Deep breathing, progressive muscle  relaxation, and visualization). Balance handout provided to take home. Written material given at graduation.   Activity Barriers & Risk Stratification:  Activity Barriers & Cardiac Risk Stratification - 05/04/21 1448       Activity Barriers & Cardiac Risk Stratification   Activity Barriers Deconditioning;Other (comment);Shortness of Breath    Comments neuropathy in feet             6 Minute Walk:  6 Minute Walk     Row Name 05/18/21 1540         6 Minute Walk   Phase Initial     Distance 1045 feet     Walk Time 6 minutes     # of Rest Breaks 0     MPH 1.98     METS 2.14     RPE 11     Perceived Dyspnea  2     VO2 Peak 7.5     Symptoms No     Resting HR 90 bpm     Resting BP 118/68     Resting Oxygen Saturation  95 %     Exercise Oxygen Saturation  during 6 min walk 89 %     Max Ex. HR 120 bpm     Max Ex. BP 138/84     2 Minute Post BP 126/78       Interval HR   1 Minute HR 107     2 Minute HR 73     3 Minute HR 107     4 Minute HR 117     5 Minute HR 119     6 Minute HR 120     2 Minute Post HR 100     Interval Heart Rate? Yes       Interval Oxygen   Interval Oxygen? Yes     Baseline Oxygen Saturation % 95 %     1 Minute Oxygen Saturation % 98 %  1 Minute Liters of Oxygen 3 L     2 Minute Oxygen Saturation % 95 %     2 Minute Liters of Oxygen 3 L     3 Minute Oxygen Saturation % 91 %     3 Minute Liters of Oxygen 3 L     4 Minute Oxygen Saturation % 92 %     4 Minute Liters of Oxygen 3 L     5 Minute Oxygen Saturation % 89 %     5 Minute Liters of Oxygen 3 L     6 Minute Oxygen Saturation % 89 %     6 Minute Liters of Oxygen 3 L     2 Minute Post Oxygen Saturation % 98 %     2 Minute Post Liters of Oxygen 3 L             Oxygen Initial Assessment:  Oxygen Initial Assessment - 05/04/21 1450       Home Oxygen   Sleep Oxygen Prescription CPAP;Continuous    Liters per minute 3    Home Exercise Oxygen Prescription Continuous    Liters  per minute 3    Home Resting Oxygen Prescription Continuous    Liters per minute 3    Compliance with Home Oxygen Use Yes      Intervention   Short Term Goals To learn and exhibit compliance with exercise, home and travel O2 prescription;To learn and understand importance of monitoring SPO2 with pulse oximeter and demonstrate accurate use of the pulse oximeter.;To learn and understand importance of maintaining oxygen saturations>88%;To learn and demonstrate proper pursed lip breathing techniques or other breathing techniques. ;To learn and demonstrate proper use of respiratory medications    Long  Term Goals Exhibits compliance with exercise, home  and travel O2 prescription;Verbalizes importance of monitoring SPO2 with pulse oximeter and return demonstration;Maintenance of O2 saturations>88%;Exhibits proper breathing techniques, such as pursed lip breathing or other method taught during program session;Compliance with respiratory medication;Demonstrates proper use of MDI's             Oxygen Re-Evaluation:  Oxygen Re-Evaluation     Row Name 05/29/21 0955 06/26/21 1051           Program Oxygen Prescription   Program Oxygen Prescription E-Tanks E-Tanks      Liters per minute 3 3      Comments as needed for oxygen saturation below 88% as needed for oxygen saturation below 88%        Home Oxygen   Sleep Oxygen Prescription CPAP;Continuous CPAP;Continuous      Liters per minute 3 3      Home Exercise Oxygen Prescription Continuous Continuous      Liters per minute 3 3      Home Resting Oxygen Prescription Continuous Continuous      Liters per minute 3 3      Compliance with Home Oxygen Use Yes Yes  Complient with night time use. Uses O2 as needed during the day, but not at all times.        Goals/Expected Outcomes   Short Term Goals To learn and exhibit compliance with exercise, home and travel O2 prescription;To learn and understand importance of monitoring SPO2 with pulse oximeter  and demonstrate accurate use of the pulse oximeter.;To learn and understand importance of maintaining oxygen saturations>88%;To learn and demonstrate proper pursed lip breathing techniques or other breathing techniques.  To learn and exhibit compliance with exercise, home and travel O2 prescription;To learn  and understand importance of monitoring SPO2 with pulse oximeter and demonstrate accurate use of the pulse oximeter.;To learn and understand importance of maintaining oxygen saturations>88%;To learn and demonstrate proper pursed lip breathing techniques or other breathing techniques.       Long  Term Goals Exhibits compliance with exercise, home  and travel O2 prescription;Verbalizes importance of monitoring SPO2 with pulse oximeter and return demonstration;Maintenance of O2 saturations>88%;Exhibits proper breathing techniques, such as pursed lip breathing or other method taught during program session;Compliance with respiratory medication Exhibits compliance with exercise, home  and travel O2 prescription;Verbalizes importance of monitoring SPO2 with pulse oximeter and return demonstration;Maintenance of O2 saturations>88%;Exhibits proper breathing techniques, such as pursed lip breathing or other method taught during program session;Compliance with respiratory medication      Comments Reviewed PLB technique with pt.  Talked about how it works and it's importance in maintaining their exercise saturations. Patient stated that she does not wear her oxygen at all times during the day, but monitors her SaO2 levels and wears her O2 if her numbers drop below 88%. She reports most of the time that her SaO2 remains in the low 90's. She does consistently wear her oxygen at night while sleeping.      Goals/Expected Outcomes Short: Become more profiecient at using PLB.   Long: Become independent at using PLB. Short: beocme more consistent with home O2 use. Long: become independent in controlling SOB with O2 useage and  PLB techniques.               Oxygen Discharge (Final Oxygen Re-Evaluation):  Oxygen Re-Evaluation - 06/26/21 1051       Program Oxygen Prescription   Program Oxygen Prescription E-Tanks    Liters per minute 3    Comments as needed for oxygen saturation below 88%      Home Oxygen   Sleep Oxygen Prescription CPAP;Continuous    Liters per minute 3    Home Exercise Oxygen Prescription Continuous    Liters per minute 3    Home Resting Oxygen Prescription Continuous    Liters per minute 3    Compliance with Home Oxygen Use Yes   Complient with night time use. Uses O2 as needed during the day, but not at all times.     Goals/Expected Outcomes   Short Term Goals To learn and exhibit compliance with exercise, home and travel O2 prescription;To learn and understand importance of monitoring SPO2 with pulse oximeter and demonstrate accurate use of the pulse oximeter.;To learn and understand importance of maintaining oxygen saturations>88%;To learn and demonstrate proper pursed lip breathing techniques or other breathing techniques.     Long  Term Goals Exhibits compliance with exercise, home  and travel O2 prescription;Verbalizes importance of monitoring SPO2 with pulse oximeter and return demonstration;Maintenance of O2 saturations>88%;Exhibits proper breathing techniques, such as pursed lip breathing or other method taught during program session;Compliance with respiratory medication    Comments Patient stated that she does not wear her oxygen at all times during the day, but monitors her SaO2 levels and wears her O2 if her numbers drop below 88%. She reports most of the time that her SaO2 remains in the low 90's. She does consistently wear her oxygen at night while sleeping.    Goals/Expected Outcomes Short: beocme more consistent with home O2 use. Long: become independent in controlling SOB with O2 useage and PLB techniques.             Initial Exercise Prescription:  Initial Exercise  Prescription -  05/18/21 1500       Date of Initial Exercise RX and Referring Provider   Date 05/18/21    Referring Provider [name]      Oxygen   Oxygen Continuous    Liters 3    Maintain Oxygen Saturation 88% or higher      Treadmill   MPH 1.5    Grade 0    Minutes 15    METs 2.15      NuStep   Level 1    SPM 80    Minutes 15    METs 2      REL-XR   Level 1    Speed 50    Minutes 15    METs 2      T5 Nustep   Level 1    SPM 80    Minutes 15    METs 2      Track   Laps 20    Minutes 15    METs 2      Prescription Details   Frequency (times per week) 3    Duration Progress to 30 minutes of continuous aerobic without signs/symptoms of physical distress      Intensity   THRR 40-80% of Max Heartrate 112-135    Ratings of Perceived Exertion 11-13    Perceived Dyspnea 0-4      Resistance Training   Training Prescription Yes    Weight 3 lb    Reps 10-15             Perform Capillary Blood Glucose checks as needed.  Exercise Prescription Changes:   Exercise Prescription Changes     Row Name 05/18/21 1500 06/06/21 1400 06/19/21 1500         Response to Exercise   Blood Pressure (Admit) 118/68 128/76 122/76     Blood Pressure (Exercise) 138/84 132/60 138/62     Blood Pressure (Exit) 126/78 124/68 118/62     Heart Rate (Admit) 90 bpm 103 bpm 91 bpm     Heart Rate (Exercise) 120 bpm 114 bpm 113 bpm     Heart Rate (Exit) 100 bpm 110 bpm 93 bpm     Oxygen Saturation (Admit) 95 % 96 % 94 %     Oxygen Saturation (Exercise) 89 % 94 % 93 %     Oxygen Saturation (Exit) 98 % 96 % 97 %     Rating of Perceived Exertion (Exercise) 11 11 11      Perceived Dyspnea (Exercise) 2 2 2      Symptoms -- -- SOB     Duration -- Progress to 30 minutes of  aerobic without signs/symptoms of physical distress Progress to 30 minutes of  aerobic without signs/symptoms of physical distress     Intensity -- THRR unchanged THRR unchanged       Progression   Progression --  Continue to progress workloads to maintain intensity without signs/symptoms of physical distress. Continue to progress workloads to maintain intensity without signs/symptoms of physical distress.     Average METs -- 2.6 2.2       Resistance Training   Training Prescription -- Yes Yes     Weight -- 3 lb 3 lb     Reps -- 10-15 10-15       Interval Training   Interval Training -- -- No       Oxygen   Oxygen -- Continuous Continuous     Liters -- 3 3       Treadmill  MPH -- 1.6 1.9     Grade -- 0 0     Minutes -- 15 15     METs -- 2.23 2.45       REL-XR   Level -- 1 --     Minutes -- 15 --     METs -- 3 --       T5 Nustep   Level -- -- 2     Minutes -- -- 15     METs -- -- 2       Oxygen   Maintain Oxygen Saturation -- 88% or higher 88% or higher              Exercise Comments:   Exercise Comments     Row Name 05/29/21 0954           Exercise Comments First full day of exercise!  Patient was oriented to gym and equipment including functions, settings, policies, and procedures.  Patient's individual exercise prescription and treatment plan were reviewed.  All starting workloads were established based on the results of the 6 minute walk test done at initial orientation visit.  The plan for exercise progression was also introduced and progression will be customized based on patient's performance and goals.                Exercise Goals and Review:   Exercise Goals     Row Name 05/18/21 1552             Exercise Goals   Increase Physical Activity Yes       Intervention Provide advice, education, support and counseling about physical activity/exercise needs.;Develop an individualized exercise prescription for aerobic and resistive training based on initial evaluation findings, risk stratification, comorbidities and participant's personal goals.       Expected Outcomes Short Term: Attend rehab on a regular basis to increase amount of physical  activity.;Long Term: Add in home exercise to make exercise part of routine and to increase amount of physical activity.;Long Term: Exercising regularly at least 3-5 days a week.       Increase Strength and Stamina Yes       Intervention Provide advice, education, support and counseling about physical activity/exercise needs.;Develop an individualized exercise prescription for aerobic and resistive training based on initial evaluation findings, risk stratification, comorbidities and participant's personal goals.       Expected Outcomes Short Term: Increase workloads from initial exercise prescription for resistance, speed, and METs.;Short Term: Perform resistance training exercises routinely during rehab and add in resistance training at home;Long Term: Improve cardiorespiratory fitness, muscular endurance and strength as measured by increased METs and functional capacity ( )       Able to understand and use rate of perceived exertion (RPE) scale Yes       Intervention Provide education and explanation on how to use RPE scale       Expected Outcomes Short Term: Able to use RPE daily in rehab to express subjective intensity level;Long Term:  Able to use RPE to guide intensity level when exercising independently       Able to understand and use Dyspnea scale Yes       Intervention Provide education and explanation on how to use Dyspnea scale       Expected Outcomes Short Term: Able to use Dyspnea scale daily in rehab to express subjective sense of shortness of breath during exertion;Long Term: Able to use Dyspnea scale to guide intensity level when exercising independently  Knowledge and understanding of Target Heart Rate Range (THRR) Yes       Intervention Provide education and explanation of THRR including how the numbers were predicted and where they are located for reference       Expected Outcomes Short Term: Able to state/look up THRR;Short Term: Able to use daily as guideline for intensity in  rehab;Long Term: Able to use THRR to govern intensity when exercising independently       Able to check pulse independently Yes       Intervention Provide education and demonstration on how to check pulse in carotid and radial arteries.;Review the importance of being able to check your own pulse for safety during independent exercise       Expected Outcomes Short Term: Able to explain why pulse checking is important during independent exercise;Long Term: Able to check pulse independently and accurately       Understanding of Exercise Prescription Yes       Intervention Provide education, explanation, and written materials on patient's individual exercise prescription       Expected Outcomes Short Term: Able to explain program exercise prescription;Long Term: Able to explain home exercise prescription to exercise independently                Exercise Goals Re-Evaluation :  Exercise Goals Re-Evaluation     Row Name 05/29/21 0954 06/06/21 1422 06/19/21 1514 06/26/21 1034       Exercise Goal Re-Evaluation   Exercise Goals Review Increase Physical Activity;Able to understand and use rate of perceived exertion (RPE) scale;Knowledge and understanding of Target Heart Rate Range (THRR);Understanding of Exercise Prescription;Increase Strength and Stamina;Able to understand and use Dyspnea scale;Able to check pulse independently Increase Physical Activity;Increase Strength and Stamina Increase Physical Activity;Increase Strength and Stamina;Understanding of Exercise Prescription Increase Physical Activity;Increase Strength and Stamina;Understanding of Exercise Prescription    Comments Reviewed RPE and dyspnea scales, THR and program prescription with pt today.  Pt voiced understanding and was given a copy of goals to take home. Evaluna is doing well so far.  She has increased to 1.6 on TM.  Oxygen has stayed int ths 90s on 3 L during exercise. Lexy has only attended once since last review.  She is  currently out with COVID.  We will continue to monitor her progress upon return (hopefully next week). Maral has returned to class since having COVID. This was her first day back She tolerated exercise well and felt like she should be able to get back to a good start attending rehab on a regular basis.    Expected Outcomes Short: Use RPE daily to regulate intensity. Long: Follow program prescription in THR. Short:  attend consistently Long:improve overall stamina Short: Return to consistent attendance Long: Continue to improve stamina Short: return to consistent rehab attendance. Long: Continue to [employer].             Discharge Exercise Prescription (Final Exercise Prescription Changes):  Exercise Prescription Changes - 06/19/21 1500       Response to Exercise   Blood Pressure (Admit) 122/76    Blood Pressure (Exercise) 138/62    Blood Pressure (Exit) 118/62    Heart Rate (Admit) 91 bpm    Heart Rate (Exercise) 113 bpm    Heart Rate (Exit) 93 bpm    Oxygen Saturation (Admit) 94 %    Oxygen Saturation (Exercise) 93 %    Oxygen Saturation (Exit) 97 %    Rating of Perceived Exertion (Exercise) 11  Perceived Dyspnea (Exercise) 2    Symptoms SOB    Duration Progress to 30 minutes of  aerobic without signs/symptoms of physical distress    Intensity THRR unchanged      Progression   Progression Continue to progress workloads to maintain intensity without signs/symptoms of physical distress.    Average METs 2.2      Resistance Training   Training Prescription Yes    Weight 3 lb    Reps 10-15      Interval Training   Interval Training No      Oxygen   Oxygen Continuous    Liters 3      Treadmill   MPH 1.9    Grade 0    Minutes 15    METs 2.45      T5 Nustep   Level 2    Minutes 15    METs 2      Oxygen   Maintain Oxygen Saturation 88% or higher             Nutrition:  Target Goals: Understanding of nutrition guidelines, daily intake of sodium  '1500mg'$ , cholesterol '200mg'$ , calories 30% from fat and 7% or less from saturated fats, daily to have 5 or more servings of fruits and vegetables.  Education: All About Nutrition: -Group instruction provided by verbal, written material, interactive activities, discussions, models, and posters to present general guidelines for heart healthy nutrition including fat, fiber, MyPlate, the role of sodium in heart healthy nutrition, utilization of the nutrition label, and utilization of this knowledge for meal planning. Follow up email sent as well. Written material given at graduation.   Biometrics:  Pre Biometrics - 05/18/21 1552       Pre Biometrics   Height 5' 5.5" (1.664 m)    Weight 203 lb 3.2 oz (92.2 kg)    BMI (Calculated) 33.29    Single Leg Stand 2.35 seconds              Nutrition Therapy Plan and Nutrition Goals:   Nutrition Assessments:  MEDIFICTS Score Key: ?70 Need to make dietary changes  40-70 Heart Healthy Diet ? 40 Therapeutic Level Cholesterol Diet  Flowsheet Row Pulmonary Rehab from 05/18/2021 in [facility] Cardiac and Pulmonary Rehab  Picture Your Plate Total Score on Admission 61      Picture Your Plate Scores: <74 Unhealthy dietary pattern with much room for improvement. 41-50 Dietary pattern unlikely to meet recommendations for good health and room for improvement. 51-60 More healthful dietary pattern, with some room for improvement.  >60 Healthy dietary pattern, although there may be some specific behaviors that could be improved.   Nutrition Goals Re-Evaluation:  Nutrition Goals Re-Evaluation     [place] Name 06/26/21 1045             Goals   Comment Patient has not yet met with program dietician.       Expected Outcome Short: meed with dieticain.                Nutrition Goals Discharge (Final Nutrition Goals Re-Evaluation):  Nutrition Goals Re-Evaluation - 06/26/21 1045       Goals   Comment Patient has not yet met with program dietician.     Expected Outcome Short: meed with dieticain.             Psychosocial: Target Goals: Acknowledge presence or absence of significant depression and/or stress, maximize coping skills, provide positive support system. Participant is able to verbalize types and  ability to use techniques and skills needed for reducing stress and depression.   Education: Stress, Anxiety, and Depression - Group verbal and visual presentation to define topics covered.  Reviews how body is impacted by stress, anxiety, and depression.  Also discusses healthy ways to reduce stress and to treat/manage anxiety and depression.  Written material given at graduation.   Education: Sleep Hygiene -Provides group verbal and written instruction about how sleep can affect your health.  Define sleep hygiene, discuss sleep cycles and impact of sleep habits. Review good sleep hygiene tips.    Initial Review & Psychosocial Screening:  Initial Psych Review & Screening - 05/04/21 1453       Initial Review   Current issues with None Identified      Family Dynamics   Good Support System? Yes   husband and children  2 nearby  one in [place]     Barriers   Psychosocial barriers to participate in program There are no identifiable barriers or psychosocial needs.      Screening Interventions   Interventions Encouraged to exercise;To provide support and resources with identified psychosocial needs;Provide feedback about the scores to participant    Expected Outcomes Short Term goal: Utilizing psychosocial counselor, staff and physician to assist with identification of specific Stressors or current issues interfering with healing process. Setting desired goal for each stressor or current issue identified.;Long Term Goal: Stressors or current issues are controlled or eliminated.;Short Term goal: Identification and review with participant of any Quality of Life or Depression concerns found by scoring the questionnaire.;Long Term  goal: The participant improves quality of Life and PHQ9 Scores as seen by post scores and/or verbalization of changes             Quality of Life Scores:  Scores of 19 and below usually indicate a poorer quality of life in these areas.  A difference of  2-3 points is a clinically meaningful difference.  A difference of 2-3 points in the total score of the Quality of Life Index has been associated with significant improvement in overall quality of life, self-image, physical symptoms, and general health in studies assessing change in quality of life.  PHQ-9: Recent Review Flowsheet Data     Depression screen [facility] 2/9 06/26/2021 05/18/2021 04/13/2021 11/14/2020 04/20/2020   Decreased Interest 1 1 0 0 0   Down, Depressed, Hopeless 1 1 0 0 0   PHQ - 2 Score 2 2 0 0 0   Altered sleeping 1 1 - - -   Tired, decreased energy 2 2 - - -   Change in appetite 1 1 - - -   Feeling bad or failure about yourself  0 1 - - -   Trouble concentrating 1 1 - - -   Moving slowly or fidgety/restless 1 0 - - -   Suicidal thoughts 0 0 - - -   PHQ-9 Score 8 8 - - -   Difficult doing work/chores Not difficult at all Somewhat difficult - - -      Interpretation of Total Score  Total Score Depression Severity:  1-4 = Minimal depression, 5-9 = Mild depression, 10-14 = Moderate depression, 15-19 = Moderately severe depression, 20-27 = Severe depression   Psychosocial Evaluation and Intervention:  Psychosocial Evaluation - 05/04/21 1505       Psychosocial Evaluation & Interventions   Interventions Encouraged to exercise with the program and follow exercise prescription    Comments Yi has no barriers to attending the  program. She wants to gain back some of her staamina and improve her breathing. She lives with her husband of 24 years. She has him as part of her support, as well as 3 children- 2 nearby and one in [place]. She had been in the program in 2018 and is ready to do what she can to acheive her goals.     Expected Outcomes STG: Parlee is able to attend all scheduled sessions. She is able to see improvment in her breathing and stamina. LTG: Lexia continues with her exercise progress and symptom control    Continue Psychosocial Services  Follow up required by staff             Psychosocial Re-Evaluation:  Psychosocial Re-Evaluation     [place] Name 06/26/21 1046             Psychosocial Re-Evaluation   Comments Patient reports no new stress or sleep concerns. She did a PHQ reevaluation with a score of 8, but attributes most of her difficulties to her physical condition, not to mental health concerns.       Expected Outcomes Short: continue to attend pulmonary rehab consistently. Long: maintian good metal health habits.       Interventions Encouraged to attend Pulmonary Rehabilitation for the exercise       Continue Psychosocial Services  Follow up required by staff                Psychosocial Discharge (Final Psychosocial Re-Evaluation):  Psychosocial Re-Evaluation - 06/26/21 1046       Psychosocial Re-Evaluation   Comments Patient reports no new stress or sleep concerns. She did a PHQ reevaluation with a score of 8, but attributes most of her difficulties to her physical condition, not to mental health concerns.    Expected Outcomes Short: continue to attend pulmonary rehab consistently. Long: maintian good metal health habits.    Interventions Encouraged to attend Pulmonary Rehabilitation for the exercise    Continue Psychosocial Services  Follow up required by staff             Education: Education Goals: Education classes will be provided on a weekly basis, covering required topics. Participant will state understanding/return demonstration of topics presented.  Learning Barriers/Preferences:  Learning Barriers/Preferences - 05/04/21 1456       Learning Barriers/Preferences   Learning Barriers None    Learning Preferences None             General  Pulmonary Education Topics:  Infection Prevention: - Provides verbal and written material to individual with discussion of infection control including proper hand washing and proper equipment cleaning during exercise session. Flowsheet Row Pulmonary Rehab from 05/31/2021 in [facility] Cardiac and Pulmonary Rehab  Date 05/18/21  Educator AS  Instruction Review Code 1- Verbalizes Understanding       Falls Prevention: - Provides verbal and written material to individual with discussion of falls prevention and safety. Flowsheet Row Pulmonary Rehab from 05/31/2021 in [facility] Cardiac and Pulmonary Rehab  Date 05/18/21  Educator AS  Instruction Review Code 1- Verbalizes Understanding       Chronic Lung Disease Review: - Group verbal instruction with posters, models, PowerPoint presentations and videos,  to review new updates, new respiratory medications, new advancements in procedures and treatments. Providing information on websites and "800" numbers for continued self-education. Includes information about supplement oxygen, available portable oxygen systems, continuous and intermittent flow rates, oxygen safety, concentrators, and Medicare reimbursement for oxygen. Explanation of Pulmonary Drugs, including class,  frequency, complications, importance of spacers, rinsing mouth after steroid MDI's, and proper cleaning methods for nebulizers. Review of basic lung anatomy and physiology related to function, structure, and complications of lung disease. Review of risk factors. Discussion about methods for diagnosing sleep apnea and types of masks and machines for OSA. Includes a review of the use of types of environmental controls: home humidity, furnaces, filters, dust mite/pet prevention, HEPA vacuums. Discussion about weather changes, air quality and the benefits of nasal washing. Instruction on Warning signs, infection symptoms, calling MD promptly, preventive modes, and value of vaccinations. Review of  effective airway clearance, coughing and/or vibration techniques. Emphasizing that all should Create an Action Plan. Written material given at graduation. Flowsheet Row Pulmonary Rehab from 05/31/2021 in [facility] Cardiac and Pulmonary Rehab  Date 05/31/21  Educator [facility]  Instruction Review Code 1- Verbalizes Understanding       AED/CPR: - Group verbal and written instruction with the use of models to demonstrate the basic use of the AED with the basic ABC's of resuscitation.    Anatomy and Cardiac Procedures: - Group verbal and visual presentation and models provide information about basic cardiac anatomy and function. Reviews the testing methods done to diagnose heart disease and the outcomes of the test results. Describes the treatment choices: Medical Management, Angioplasty, or Coronary Bypass Surgery for treating various heart conditions including Myocardial Infarction, Angina, Valve Disease, and Cardiac Arrhythmias.  Written material given at graduation.   Medication Safety: - Group verbal and visual instruction to review commonly prescribed medications for heart and lung disease. Reviews the medication, class of the drug, and side effects. Includes the steps to properly store meds and maintain the prescription regimen.  Written material given at graduation.   Other: -Provides group and verbal instruction on various topics (see comments)   Knowledge Questionnaire Score:  Knowledge Questionnaire Score - 05/18/21 1555       Knowledge Questionnaire Score   Pre Score 15/18              Core Components/Risk Factors/Patient Goals at Admission:  Personal Goals and Risk Factors at Admission - 05/18/21 1600       Core Components/Risk Factors/Patient Goals on Admission    Weight Management Yes    Intervention Weight Management: Develop a combined nutrition and exercise program designed to reach desired caloric intake, while maintaining appropriate intake of nutrient and fiber,  sodium and fats, and appropriate energy expenditure required for the weight goal.;Weight Management/Obesity: Establish reasonable short term and long term weight goals.    Admit Weight 204 lb (92.5 kg)    Goal Weight: Short Term 200 lb (90.7 kg)    Goal Weight: Long Term 150 lb (68 kg)    Expected Outcomes Short Term: Continue to assess and modify interventions until short term weight is achieved;Long Term: Adherence to nutrition and physical activity/exercise program aimed toward attainment of established weight goal;Weight Loss: Understanding of general recommendations for a balanced deficit meal plan, which promotes 1-2 lb weight loss per week and includes a negative energy balance of [phone] kcal/d    Improve shortness of breath with ADL's Yes    Intervention Provide education, individualized exercise plan and daily activity instruction to help decrease symptoms of SOB with activities of daily living.    Expected Outcomes Short Term: Improve cardiorespiratory fitness to achieve a reduction of symptoms when performing ADLs;Long Term: Be able to perform more ADLs without symptoms or delay the onset of symptoms    Intervention Provide education,  demonstration and support about specific breathing techniuqes utilized for more efficient breathing. Include techniques such as pursed lipped breathing, diaphragmatic breathing and self-pacing activity.    Expected Outcomes Short Term: Participant will be able to demonstrate and use breathing techniques as needed throughout daily activities.    Increase knowledge of respiratory medications and ability to use respiratory devices properly  Yes    Intervention Provide education and demonstration as needed of appropriate use of medications, inhalers, and oxygen therapy.    Expected Outcomes Short Term: Achieves understanding of medications use. Understands that oxygen is a medication prescribed by physician. Demonstrates appropriate use of inhaler and oxygen  therapy.;Long Term: Maintain appropriate use of medications, inhalers, and oxygen therapy.    Diabetes Yes    Intervention Provide education about signs/symptoms and action to take for hypo/hyperglycemia.;Provide education about proper nutrition, including hydration, and aerobic/resistive exercise prescription along with prescribed medications to achieve blood glucose in normal ranges: Fasting glucose 65-99 mg/dL    Expected Outcomes Short Term: Participant verbalizes understanding of the signs/symptoms and immediate care of hyper/hypoglycemia, proper foot care and importance of medication, aerobic/resistive exercise and nutrition plan for blood glucose control.;Long Term: Attainment of HbA1C < 7%.    Heart Failure Yes    Intervention Provide a combined exercise and nutrition program that is supplemented with education, support and counseling about heart failure. Directed toward relieving symptoms such as shortness of breath, decreased exercise tolerance, and extremity edema.    Expected Outcomes Improve functional capacity of life;Short term: Attendance in program 2-3 days a week with increased exercise capacity. Reported lower sodium intake. Reported increased fruit and vegetable intake. Reports medication compliance.;Short term: Daily weights obtained and reported for increase. Utilizing diuretic protocols set by physician.;Long term: Adoption of self-care skills and reduction of barriers for early signs and symptoms recognition and intervention leading to self-care maintenance.    Hypertension Yes    Intervention Provide education on lifestyle modifcations including regular physical activity/exercise, weight management, moderate sodium restriction and increased consumption of fresh fruit, vegetables, and low fat dairy, alcohol moderation, and smoking cessation.;Monitor prescription use compliance.    Expected Outcomes Short Term: Continued assessment and intervention until BP is < 140/64mm HG in  hypertensive participants. < 130/29mm HG in hypertensive participants with diabetes, heart failure or chronic kidney disease.;Long Term: Maintenance of blood pressure at goal levels.    Lipids Yes    Intervention Provide education and support for participant on nutrition & aerobic/resistive exercise along with prescribed medications to achieve LDL '70mg'$ , HDL >$Remo'40mg'LmvAQ$ .    Expected Outcomes Short Term: Participant states understanding of desired cholesterol values and is compliant with medications prescribed. Participant is following exercise prescription and nutrition guidelines.;Long Term: Cholesterol controlled with medications as prescribed, with individualized exercise RX and with personalized nutrition plan. Value goals: LDL < $Rem'70mg'BUqe$ , HDL > 40 mg.             Education:Diabetes - Individual verbal and written instruction to review signs/symptoms of diabetes, desired ranges of glucose level fasting, after meals and with exercise. Acknowledge that pre and post exercise glucose checks will be done for 3 sessions at entry of program. Flowsheet Row Pulmonary Rehab from 05/31/2021 in [facility] Cardiac and Pulmonary Rehab  Date 05/18/21  Educator AS  Instruction Review Code 1- Verbalizes Understanding       Know Your Numbers and Heart Failure: - Group verbal and visual instruction to discuss disease risk factors for cardiac and pulmonary disease and treatment options.  Reviews associated critical  values for Overweight/Obesity, Hypertension, Cholesterol, and Diabetes.  Discusses basics of heart failure: signs/symptoms and treatments.  Introduces Heart Failure Zone chart for action plan for heart failure.  Written material given at graduation.   Core Components/Risk Factors/Patient Goals Review:   Goals and Risk Factor Review     Row Name 06/26/21 1041             Core Components/Risk Factors/Patient Goals Review   Personal Goals Review Weight Management/Obesity;Develop more efficient breathing  techniques such as purse lipped breathing and diaphragmatic breathing and practicing self-pacing with activity.;Heart Failure;Increase knowledge of respiratory medications and ability to use respiratory devices properly.;Hypertension;Improve shortness of breath with ADL's;Diabetes;Lipids       Review Patient reports reports that she is taking all her medications. She stated that her Diabetes medication bothers her stomach, and since having COVID it has made it even worse. She is working on building back to her full dose but at this current time she is long able to tolerate a half dose. She is keeping a close eye on her blood sugar as she works back up to her full dose. Patient does not weigh every day and she was encouraged to do so to keep an eye on weight and fluid levels.       Expected Outcomes Short: work back up to full dose of DM medications and communicate any concerns with doctor. Weight every day. Long: continue improvements with SOB and continue to manage disease with medical care team.                Core Components/Risk Factors/Patient Goals at Discharge (Final Review):   Goals and Risk Factor Review - 06/26/21 1041       Core Components/Risk Factors/Patient Goals Review   Personal Goals Review Weight Management/Obesity;Develop more efficient breathing techniques such as purse lipped breathing and diaphragmatic breathing and practicing self-pacing with activity.;Heart Failure;Increase knowledge of respiratory medications and ability to use respiratory devices properly.;Hypertension;Improve shortness of breath with ADL's;Diabetes;Lipids    Review Patient reports reports that she is taking all her medications. She stated that her Diabetes medication bothers her stomach, and since having COVID it has made it even worse. She is working on building back to her full dose but at this current time she is long able to tolerate a half dose. She is keeping a close eye on her blood sugar as she  works back up to her full dose. Patient does not weigh every day and she was encouraged to do so to keep an eye on weight and fluid levels.    Expected Outcomes Short: work back up to full dose of DM medications and communicate any concerns with doctor. Weight every day. Long: continue improvements with SOB and continue to manage disease with medical care team.             ITP Comments:  ITP Comments     Row Name 05/04/21 1511 05/18/21 1606 05/29/21 0954 05/31/21 1429 06/19/21 1513   ITP Comments Virtual orientation call completed today. shehas an appointment on Date: 05/18/2021  for EP eval and gym Orientation.  Documentation of diagnosis can be found in [facility] Date:   02/07/2021 Completed 6MWT and gym orientation. Initial ITP created and sent for review to Dr. Ottie Glazier, Medical Director. First full day of exercise!  Patient was oriented to gym and equipment including functions, settings, policies, and procedures.  Patient's individual exercise prescription and treatment plan were reviewed.  All starting workloads were established  based on the results of the 6 minute walk test done at initial orientation visit.  The plan for exercise progression was also introduced and progression will be customized based on patient's performance and goals. 30 day review completed. ITP sent to Dr. Zetta Bills, Medical Director of  Pulmonary Rehab. Continue with ITP unless changes are made by physician. Pt is currently out with COVID.  She is due to return on 10/31    Row Name 06/28/21 0815           ITP Comments 30 Day review completed. Medical Director ITP review done, changes made as directed, and signed approval by Medical Director.                Comments:  30 Day review completed. Medical Director ITP review done, changes made as directed, and signed approval by Medical Director.

## 2021-07-03 ENCOUNTER — Encounter: Payer: Medicare PPO | Attending: Emergency Medicine | Admitting: *Deleted

## 2021-07-03 ENCOUNTER — Other Ambulatory Visit: Payer: Self-pay

## 2021-07-03 DIAGNOSIS — J984 Other disorders of lung: Secondary | ICD-10-CM | POA: Insufficient documentation

## 2021-07-03 NOTE — Progress Notes (Signed)
Daily Session Note  Patient Details  Name: Betty [name] MRN: [id number] Date of Birth: 1945-10-05 Referring Provider:   April Manson Pulmonary Rehab from 05/18/2021 in [facility] Cardiac and Pulmonary Rehab  Referring Provider [name]       Encounter Date: 07/03/2021  Check In:  Session Check In - 07/03/21 0959       Check-In   Supervising physician immediately available to respond to emergencies See telemetry face sheet for immediately available ER MD    Location [facility]-Cardiac & Pulmonary Rehab    Staff Present Heath Lark, RN, BSN, Lance Sell, BA, ACSM CEP, Exercise Physiologist;Kelly Amedeo Plenty, BS, ACSM CEP, Exercise Physiologist    Virtual Visit No    Medication changes reported     No    Fall or balance concerns reported    No    Warm-up and Cool-down Performed on first and last piece of equipment    Resistance Training Performed Yes    VAD Patient? No    PAD/SET Patient? No      Pain Assessment   Currently in Pain? No/denies                Social History   Tobacco Use  Smoking Status Never  Smokeless Tobacco Never    Goals Met:  Proper associated with RPD/PD & O2 Sat Independence with exercise equipment Exercise tolerated well No report of concerns or symptoms today  Goals Unmet:  Not Applicable  Comments: Pt able to follow exercise prescription today without complaint.  Will continue to monitor for progression.    Dr. Emily Filbert is Medical Director for [place].  Dr. Ottie Glazier is Medical Director for [facility] Pulmonary Rehabilitation.

## 2021-07-04 DIAGNOSIS — G4733 Obstructive sleep apnea (adult) (pediatric): Secondary | ICD-10-CM | POA: Diagnosis not present

## 2021-07-04 DIAGNOSIS — J9610 Chronic respiratory failure, unspecified whether with hypoxia or hypercapnia: Secondary | ICD-10-CM | POA: Diagnosis not present

## 2021-07-05 ENCOUNTER — Other Ambulatory Visit: Payer: Self-pay

## 2021-07-05 DIAGNOSIS — J984 Other disorders of lung: Secondary | ICD-10-CM

## 2021-07-05 NOTE — Progress Notes (Signed)
Daily Session Note  Patient Details  Name: Betty [name] MRN: [id number] Date of Birth: [birth year] Referring Provider:   April Manson Pulmonary Rehab from 05/18/2021 in [facility] Cardiac and Pulmonary Rehab  Referring Provider [name]       Encounter Date: 07/05/2021  Check In:  Session Check In - 07/05/21 1005       Check-In   Supervising physician immediately available to respond to emergencies See telemetry face sheet for immediately available ER MD    Location [facility]-Cardiac & Pulmonary Rehab    Staff Present [name], MPA, RN;Jessica [place], MA, RCEP, CCRP, CCET;Joseph [place], [place]    Virtual Visit No    Medication changes reported     No    Fall or balance concerns reported    No    Warm-up and Cool-down Performed on first and last piece of equipment    Resistance Training Performed Yes    VAD Patient? No    PAD/SET Patient? No      Pain Assessment   Currently in Pain? No/denies                Social History   Tobacco Use  Smoking Status Never  Smokeless Tobacco Never    Goals Met:  Independence with exercise equipment Exercise tolerated well No report of concerns or symptoms today Strength training completed today  Goals Unmet:  Not Applicable  Comments: Pt able to follow exercise prescription today without complaint.  Will continue to monitor for progression.    Dr. Emily Filbert is Medical Director for [place].  Dr. Ottie Glazier is Medical Director for [facility] Pulmonary Rehabilitation.

## 2021-07-07 ENCOUNTER — Ambulatory Visit: Payer: Medicare PPO | Admitting: Family Medicine

## 2021-07-07 ENCOUNTER — Encounter: Payer: Medicare PPO | Admitting: *Deleted

## 2021-07-07 ENCOUNTER — Other Ambulatory Visit: Payer: Self-pay

## 2021-07-07 DIAGNOSIS — J984 Other disorders of lung: Secondary | ICD-10-CM

## 2021-07-07 NOTE — Progress Notes (Signed)
Daily Session Note  Patient Details  Name: Betty [name] MRN: [id number] Date of Birth: [birth year] Referring Provider:   April Manson Pulmonary Rehab from 05/18/2021 in [facility] Cardiac and Pulmonary Rehab  Referring Provider [name]       Encounter Date: 07/07/2021  Check In:  Session Check In - 07/07/21 0958       Check-In   Supervising physician immediately available to respond to emergencies See telemetry face sheet for immediately available ER MD    Location [facility]-Cardiac & Pulmonary Rehab    Staff Present Renita Papa, RN BSN;Joseph [place], RCP,RRT,BSRT;Laureen Yale, [place], RRT, CPFT    Virtual Visit No    Medication changes reported     No    Fall or balance concerns reported    No    Warm-up and Cool-down Performed on first and last piece of equipment    Resistance Training Performed Yes    VAD Patient? No    PAD/SET Patient? No      Pain Assessment   Currently in Pain? No/denies                Social History   Tobacco Use  Smoking Status Never  Smokeless Tobacco Never    Goals Met:  Independence with exercise equipment Exercise tolerated well No report of concerns or symptoms today Strength training completed today  Goals Unmet:  Not Applicable  Comments: Pt able to follow exercise prescription today without complaint.  Will continue to monitor for progression.    Dr. Emily Filbert is Medical Director for [place].  Dr. Ottie Glazier is Medical Director for [facility] Pulmonary Rehabilitation.

## 2021-07-10 ENCOUNTER — Encounter: Payer: Medicare PPO | Admitting: *Deleted

## 2021-07-10 ENCOUNTER — Other Ambulatory Visit: Payer: Self-pay

## 2021-07-10 DIAGNOSIS — J984 Other disorders of lung: Secondary | ICD-10-CM

## 2021-07-10 NOTE — Progress Notes (Signed)
Daily Session Note  Patient Details  Name: Betty [name] MRN: [id number] Date of Birth: Sep 25, 1945 Referring Provider:   April Manson Pulmonary Rehab from 05/18/2021 in [facility] Cardiac and Pulmonary Rehab  Referring Provider [name]       Encounter Date: 07/10/2021  Check In:  Session Check In - 07/10/21 1013       Check-In   Supervising physician immediately available to respond to emergencies See telemetry face sheet for immediately available ER MD    Location [facility]-Cardiac & Pulmonary Rehab    Staff Present Renita Papa, RN Moises Blood, BS, ACSM CEP, Exercise Physiologist;Amanda Oletta Darter, [place], ACSM CEP, Exercise Physiologist    Virtual Visit No    Medication changes reported     No    Fall or balance concerns reported    No    Warm-up and Cool-down Performed on first and last piece of equipment    Resistance Training Performed Yes    VAD Patient? No    PAD/SET Patient? No      Pain Assessment   Currently in Pain? No/denies                Social History   Tobacco Use  Smoking Status Never  Smokeless Tobacco Never    Goals Met:  Independence with exercise equipment Exercise tolerated well No report of concerns or symptoms today Strength training completed today  Goals Unmet:  Not Applicable  Comments: Pt able to follow exercise prescription today without complaint.  Will continue to monitor for progression.    Dr. Emily Filbert is Medical Director for [place].  Dr. Ottie Glazier is Medical Director for [facility] Pulmonary Rehabilitation.

## 2021-07-12 ENCOUNTER — Other Ambulatory Visit: Payer: Self-pay

## 2021-07-12 DIAGNOSIS — J984 Other disorders of lung: Secondary | ICD-10-CM

## 2021-07-12 NOTE — Progress Notes (Signed)
Daily Session Note  Patient Details  Name: Betty [name] MRN: [id number] Date of Birth: 04-17-1946 Referring Provider:   April Manson Pulmonary Rehab from 05/18/2021 in [facility] Cardiac and Pulmonary Rehab  Referring Provider [name]       Encounter Date: 07/12/2021  Check In:  Session Check In - 07/12/21 0947       Check-In   Supervising physician immediately available to respond to emergencies See telemetry face sheet for immediately available ER MD    Location [facility]-Cardiac & Pulmonary Rehab    Staff Present [name], MPA, Elveria Rising, BA, ACSM CEP, Exercise Physiologist;Joseph Tessie Fass, [place]    Virtual Visit No    Medication changes reported     No    Fall or balance concerns reported    No    Warm-up and Cool-down Performed on first and last piece of equipment    Resistance Training Performed Yes    VAD Patient? No    PAD/SET Patient? No      Pain Assessment   Currently in Pain? No/denies                Social History   Tobacco Use  Smoking Status Never  Smokeless Tobacco Never    Goals Met:  Independence with exercise equipment Exercise tolerated well No report of concerns or symptoms today Strength training completed today  Goals Unmet:  Not Applicable  Comments: Pt able to follow exercise prescription today without complaint.  Will continue to monitor for progression.    Dr. Emily Filbert is Medical Director for [place].  Dr. Ottie Glazier is Medical Director for [facility] Pulmonary Rehabilitation.

## 2021-07-14 ENCOUNTER — Encounter: Payer: Medicare PPO | Admitting: *Deleted

## 2021-07-14 ENCOUNTER — Other Ambulatory Visit: Payer: Self-pay

## 2021-07-14 DIAGNOSIS — J984 Other disorders of lung: Secondary | ICD-10-CM

## 2021-07-14 NOTE — Progress Notes (Signed)
Daily Session Note  Patient Details  Name: Betty [name] MRN: [id number] Date of Birth: [birth year] Referring Provider:   April Manson Pulmonary Rehab from 05/18/2021 in [facility] Cardiac and Pulmonary Rehab  Referring Provider [name]       Encounter Date: 07/14/2021  Check In:  Session Check In - 07/14/21 0958       Check-In   Supervising physician immediately available to respond to emergencies See telemetry face sheet for immediately available ER MD    Location [facility]-Cardiac & Pulmonary Rehab    Staff Present Renita Papa, RN BSN;Joseph [place], RCP,RRT,BSRT;Jessica [place], [place], RCEP, CCRP, CCET    Virtual Visit No    Medication changes reported     No    Fall or balance concerns reported    No    Warm-up and Cool-down Performed on first and last piece of equipment    Resistance Training Performed Yes    VAD Patient? No    PAD/SET Patient? No      Pain Assessment   Currently in Pain? No/denies                Social History   Tobacco Use  Smoking Status Never  Smokeless Tobacco Never    Goals Met:  Independence with exercise equipment Exercise tolerated well No report of concerns or symptoms today Strength training completed today  Goals Unmet:  Not Applicable  Comments: Pt able to follow exercise prescription today without complaint.  Will continue to monitor for progression.    Dr. Emily Filbert is Medical Director for [place].  Dr. Ottie Glazier is Medical Director for [facility] Pulmonary Rehabilitation.

## 2021-07-17 ENCOUNTER — Encounter: Payer: Medicare PPO | Admitting: *Deleted

## 2021-07-17 ENCOUNTER — Other Ambulatory Visit: Payer: Self-pay

## 2021-07-17 DIAGNOSIS — J984 Other disorders of lung: Secondary | ICD-10-CM | POA: Diagnosis not present

## 2021-07-17 NOTE — Progress Notes (Signed)
Daily Session Note  Patient Details  Name: Betty [name] MRN: [id number] Date of Birth: 1945/11/13 Referring Provider:   April Manson Pulmonary Rehab from 05/18/2021 in [facility] Cardiac and Pulmonary Rehab  Referring Provider [name]       Encounter Date: 07/17/2021  Check In:  Session Check In - 07/17/21 1027       Check-In   Supervising physician immediately available to respond to emergencies See telemetry face sheet for immediately available ER MD    Location [facility]-Cardiac & Pulmonary Rehab    Staff Present Renita Papa, RN Moises Blood, BS, ACSM CEP, Exercise Physiologist;Amanda Oletta Darter, [place], ACSM CEP, Exercise Physiologist    Virtual Visit No    Medication changes reported     No    Fall or balance concerns reported    No    Warm-up and Cool-down Performed on first and last piece of equipment    Resistance Training Performed Yes    VAD Patient? No    PAD/SET Patient? No      Pain Assessment   Currently in Pain? No/denies                Social History   Tobacco Use  Smoking Status Never  Smokeless Tobacco Never    Goals Met:  Independence with exercise equipment Exercise tolerated well No report of concerns or symptoms today Strength training completed today  Goals Unmet:  Not Applicable  Comments: Pt able to follow exercise prescription today without complaint.  Will continue to monitor for progression.    Dr. Emily Filbert is Medical Director for [place].  Dr. Ottie Glazier is Medical Director for [facility] Pulmonary Rehabilitation.

## 2021-07-24 ENCOUNTER — Other Ambulatory Visit: Payer: Self-pay

## 2021-07-24 ENCOUNTER — Encounter: Payer: Medicare PPO | Admitting: *Deleted

## 2021-07-24 DIAGNOSIS — J984 Other disorders of lung: Secondary | ICD-10-CM

## 2021-07-24 NOTE — Progress Notes (Signed)
Daily Session Note  Patient Details  Name: Betty [name] MRN: [id number] Date of Birth: 08-24-1946 Referring Provider:   April Manson Pulmonary Rehab from 05/18/2021 in [facility] Cardiac and Pulmonary Rehab  Referring Provider [name]       Encounter Date: 07/24/2021  Check In:  Session Check In - 07/24/21 1021       Check-In   Supervising physician immediately available to respond to emergencies See telemetry face sheet for immediately available ER MD    Location [facility]-Cardiac & Pulmonary Rehab    Staff Present Renita Papa, RN Moises Blood, BS, ACSM CEP, Exercise Physiologist;Amanda Oletta Darter, [place], ACSM CEP, Exercise Physiologist    Virtual Visit No    Medication changes reported     No    Fall or balance concerns reported    No    Warm-up and Cool-down Performed on first and last piece of equipment    Resistance Training Performed Yes    VAD Patient? No    PAD/SET Patient? No      Pain Assessment   Currently in Pain? No/denies                Social History   Tobacco Use  Smoking Status Never  Smokeless Tobacco Never    Goals Met:  Independence with exercise equipment Exercise tolerated well No report of concerns or symptoms today Strength training completed today  Goals Unmet:  Not Applicable  Comments: Pt able to follow exercise prescription today without complaint.  Will continue to monitor for progression.    Dr. Emily Filbert is Medical Director for [place].  Dr. Ottie Glazier is Medical Director for [facility] Pulmonary Rehabilitation.

## 2021-07-24 NOTE — Progress Notes (Signed)
Completed initial RD consultation ?

## 2021-07-26 ENCOUNTER — Other Ambulatory Visit: Payer: Self-pay

## 2021-07-26 ENCOUNTER — Encounter: Payer: Self-pay | Admitting: *Deleted

## 2021-07-26 DIAGNOSIS — J984 Other disorders of lung: Secondary | ICD-10-CM

## 2021-07-26 NOTE — Progress Notes (Signed)
Daily Session Note  Patient Details  Name: Betty [name] MRN: [id number] Date of Birth: April 26, 1946 Referring Provider:   April Manson Pulmonary Rehab from 05/18/2021 in [facility] Cardiac and Pulmonary Rehab  Referring Provider [name]       Encounter Date: 07/26/2021  Check In:  Session Check In - 07/26/21 0957       Check-In   Supervising physician immediately available to respond to emergencies See telemetry face sheet for immediately available ER MD    Location [facility]-Cardiac & Pulmonary Rehab    Staff Present [name], MPA, Elveria Rising, BA, ACSM CEP, Exercise Physiologist;Joseph Tessie Fass, [place]    Virtual Visit No    Medication changes reported     No    Fall or balance concerns reported    No    Warm-up and Cool-down Performed on first and last piece of equipment    Resistance Training Performed Yes    VAD Patient? No    PAD/SET Patient? No      Pain Assessment   Currently in Pain? No/denies                Social History   Tobacco Use  Smoking Status Never  Smokeless Tobacco Never    Goals Met:  Independence with exercise equipment Exercise tolerated well No report of concerns or symptoms today Strength training completed today  Goals Unmet:  Not Applicable  Comments: Pt able to follow exercise prescription today without complaint.  Will continue to monitor for progression.    Dr. Emily Filbert is Medical Director for [place].  Dr. Ottie Glazier is Medical Director for [facility] Pulmonary Rehabilitation.

## 2021-07-26 NOTE — Progress Notes (Signed)
Pulmonary Individual Treatment Plan  Patient Details  Name: ARDEL JAGGER MRN: [id number] Date of Birth: [birth year] Referring Provider:   April Manson Pulmonary Rehab from 05/18/2021 in [facility] Cardiac and Pulmonary Rehab  Referring Provider [name]       Initial Encounter Date:  Flowsheet Row Pulmonary Rehab from 05/18/2021 in [facility] Cardiac and Pulmonary Rehab  Date 05/18/21       Visit Diagnosis: Restrictive lung disease  Patient's Home Medications on Admission:  Current Outpatient Medications:    acetic acid 2 % otic solution, Insert saturated wick of cotton; keep moist 24 hours by adding 3 to 5 drops every 4 to 6 hours; remove wick after 24 hours and instill 5 drops 3 to 4 times daily, Disp: 15 mL, Rfl: 0   albuterol (VENTOLIN HFA) 108 (90 Base) MCG/ACT inhaler, Inhale 2 puffs into the lungs every 4 (four) hours as needed for wheezing or shortness of breath., Disp: 8 g, Rfl: 6   aspirin 81 MG EC tablet, Take 1 tablet (81 mg total) by mouth daily., Disp: 90 tablet, Rfl: 2   CALCIUM-MAGNESIUM-VITAMIN D PO, Take 1 tablet by mouth daily., Disp: , Rfl:    carvedilol (COREG) 12.5 MG tablet, TAKE 1 TABLET BY MOUTH TWICE A DAY WITH MEALS, Disp: 60 tablet, Rfl: 11   clobetasol (TEMOVATE) 0.05 % GEL, Apply topically 2 (two) times daily., Disp: , Rfl:    Coenzyme Q10 (CO Q 10 PO), Take 200 mg by mouth at bedtime. , Disp: , Rfl:    Colchicine (MITIGARE) 0.6 MG CAPS, Day 1: take 1.2 mg (2 tablets) by mouth at the first sign of flare, followed by 0.6 mg (1 tablet) by mouth after 1 hour. Day 2 and thereafter: take 0.6 mg (1 tablet) by mouth daily until flare has resolved., Disp: 30 capsule, Rfl: 0   Fish Oil OIL, Take 1,200 mg by mouth 2 (two) times daily. GUMMIES, Disp: , Rfl:    fluticasone (FLONASE) 50 MCG/ACT nasal spray, Place 2 sprays into both nostrils daily., Disp: 16 g, Rfl: 6   losartan (COZAAR) 50 MG tablet, TAKE 1/2 TABLET BY MOUTH DAILY, Disp: 45 tablet, Rfl: 3   metFORMIN (GLUCOPHAGE  XR) 500 MG 24 hr tablet, Take 2 tablets (1,000 mg total) by mouth 2 (two) times daily with a meal., Disp: 360 tablet, Rfl: 1   multivitamin (THERAGRAN) per tablet, Take 1 tablet by mouth daily., Disp: , Rfl:    nystatin cream (MYCOSTATIN), Apply 1 application topically 2 (two) times daily., Disp: 30 g, Rfl: 0   polyvinyl alcohol (LIQUIFILM TEARS) 1.4 % ophthalmic solution, Place 1 drop into both eyes every morning., Disp: , Rfl:    pravastatin (PRAVACHOL) 20 MG tablet, Take 1 tablet (20 mg total) by mouth daily., Disp: 90 tablet, Rfl: 1  Past Medical History: Past Medical History:  Diagnosis Date   Allergic rhinitis    never tested, fall and spring   Arthritis    hands,knees, feet   Cataracts, bilateral    not surgical yet   CHOLECYSTECTOMY, HX OF 10/08/2007   Qualifier: Diagnosis of  By: Council Mechanic MD, [name]    Chronic diastolic CHF (congestive heart failure) (HCC)    Chronic respiratory failure ([place])    Diabetes mellitus without complication ([place])    Diverticulosis    problems with frequent gas, burping   Glucose intolerance (impaired glucose tolerance)    Gout    History of chicken pox    Hyperlipidemia    Hypertension  NICM (nonischemic cardiomyopathy) ([place]) 2002   EF 25%; improved to normal - echo 4/08: EF 50-55%, mild MR, mild LAE, mild TR;    cath 3/03: normal cors, EF 40%   Obesity    Oxygen deficiency 2017   at night   PONV (postoperative nausea and vomiting)    after wisdom tooth extraction   Restrictive lung disease    Skin cancer    skin cancers only   Sleep apnea    cpap settings 4    Tobacco Use: Social History   Tobacco Use  Smoking Status Never  Smokeless Tobacco Never    Labs: Recent Review Flowsheet Data     Labs for ITP Cardiac and Pulmonary Rehab Latest Ref Rng & Units 11/14/2020 12/26/2020 02/21/2021 04/11/2021 05/17/2021   Cholestrol 0 - 200 mg/dL - - - - -   LDLCALC 0 - 99 mg/dL - - - - -   LDLDIRECT mg/dL 114.0 86.0 79.0 63.0 -   HDL  >39.00 mg/dL - - - - -   Trlycerides 0.0 - 149.0 mg/dL - - - - -   Hemoglobin A1c 4.6 - 6.5 % 6.9(H) - - - 7.2(H)   HCO3 20.0 - 24.0 mEq/L - - - - -   TCO2 0 - 100 mmol/L - - - - -   O2SAT % - - - - -        Pulmonary Assessment Scores:  Pulmonary Assessment Scores     Row Name 05/18/21 1557         ADL UCSD   ADL Phase Entry     SOB Score total 51     Rest 0     Walk 2     Stairs 2     Bath 1     Dress 1     Shop 3       CAT Score   CAT Score 15       mMRC Score   mMRC Score 2              UCSD: Self-administered rating of dyspnea associated with activities of daily living (ADLs) 6-point scale (0 = "not at all" to 5 = "maximal or unable to do because of breathlessness")  Scoring Scores range from 0 to 120.  Minimally important difference is 5 units  CAT: CAT can identify the health impairment of COPD patients and is better correlated with disease progression.  CAT has a scoring range of zero to 40. The CAT score is classified into four groups of low (less than 10), medium (10 - 20), high (21-30) and very high (31-40) based on the impact level of disease on health status. A CAT score over 10 suggests significant symptoms.  A worsening CAT score could be explained by an exacerbation, poor medication adherence, poor inhaler technique, or progression of COPD or comorbid conditions.  CAT MCID is 2 points  mMRC: mMRC (Modified Medical Research Council) Dyspnea Scale is used to assess the degree of baseline functional disability in patients of respiratory disease due to dyspnea. No minimal important difference is established. A decrease in score of 1 point or greater is considered a positive change.   Pulmonary Function Assessment:   Exercise Target Goals: Exercise Program Goal: Individual exercise prescription set using results from initial 6 min walk test and THRR while considering  patient's activity barriers and safety.   Exercise Prescription Goal: Initial  exercise prescription builds to 30-45 minutes a day of aerobic activity, 2-3  days per week.  Home exercise guidelines will be given to patient during program as part of exercise prescription that the participant will acknowledge.  Education: Aerobic Exercise: - Group verbal and visual presentation on the components of exercise prescription. Introduces F.I.T.T principle from ACSM for exercise prescriptions.  Reviews F.I.T.T. principles of aerobic exercise including progression. Written material given at graduation.   Education: Resistance Exercise: - Group verbal and visual presentation on the components of exercise prescription. Introduces F.I.T.T principle from ACSM for exercise prescriptions  Reviews F.I.T.T. principles of resistance exercise including progression. Written material given at graduation.    Education: Exercise & Equipment Safety: - Individual verbal instruction and demonstration of equipment use and safety with use of the equipment. Flowsheet Row Pulmonary Rehab from 07/12/2021 in [facility] Cardiac and Pulmonary Rehab  Date 05/18/21  Educator AS  Instruction Review Code 1- Verbalizes Understanding       Education: Exercise Physiology & General Exercise Guidelines: - Group verbal and written instruction with models to review the exercise physiology of the cardiovascular system and associated critical values. Provides general exercise guidelines with specific guidelines to those with heart or lung disease.    Education: Flexibility, Balance, Mind/Body Relaxation: - Group verbal and visual presentation with interactive activity on the components of exercise prescription. Introduces F.I.T.T principle from ACSM for exercise prescriptions. Reviews F.I.T.T. principles of flexibility and balance exercise training including progression. Also discusses the mind body connection.  Reviews various relaxation techniques to help reduce and manage stress (i.e. Deep breathing, progressive muscle  relaxation, and visualization). Balance handout provided to take home. Written material given at graduation.   Activity Barriers & Risk Stratification:  Activity Barriers & Cardiac Risk Stratification - 05/04/21 1448       Activity Barriers & Cardiac Risk Stratification   Activity Barriers Deconditioning;Other (comment);Shortness of Breath    Comments neuropathy in feet             6 Minute Walk:  6 Minute Walk     Row Name 05/18/21 1540         6 Minute Walk   Phase Initial     Distance 1045 feet     Walk Time 6 minutes     # of Rest Breaks 0     MPH 1.98     METS 2.14     RPE 11     Perceived Dyspnea  2     VO2 Peak 7.5     Symptoms No     Resting HR 90 bpm     Resting BP 118/68     Resting Oxygen Saturation  95 %     Exercise Oxygen Saturation  during 6 min walk 89 %     Max Ex. HR 120 bpm     Max Ex. BP 138/84     2 Minute Post BP 126/78       Interval HR   1 Minute HR 107     2 Minute HR 73     3 Minute HR 107     4 Minute HR 117     5 Minute HR 119     6 Minute HR 120     2 Minute Post HR 100     Interval Heart Rate? Yes       Interval Oxygen   Interval Oxygen? Yes     Baseline Oxygen Saturation % 95 %     1 Minute Oxygen Saturation % 98 %  1 Minute Liters of Oxygen 3 L     2 Minute Oxygen Saturation % 95 %     2 Minute Liters of Oxygen 3 L     3 Minute Oxygen Saturation % 91 %     3 Minute Liters of Oxygen 3 L     4 Minute Oxygen Saturation % 92 %     4 Minute Liters of Oxygen 3 L     5 Minute Oxygen Saturation % 89 %     5 Minute Liters of Oxygen 3 L     6 Minute Oxygen Saturation % 89 %     6 Minute Liters of Oxygen 3 L     2 Minute Post Oxygen Saturation % 98 %     2 Minute Post Liters of Oxygen 3 L             Oxygen Initial Assessment:  Oxygen Initial Assessment - 05/04/21 1450       Home Oxygen   Sleep Oxygen Prescription CPAP;Continuous    Liters per minute 3    Home Exercise Oxygen Prescription Continuous    Liters  per minute 3    Home Resting Oxygen Prescription Continuous    Liters per minute 3    Compliance with Home Oxygen Use Yes      Intervention   Short Term Goals To learn and exhibit compliance with exercise, home and travel O2 prescription;To learn and understand importance of monitoring SPO2 with pulse oximeter and demonstrate accurate use of the pulse oximeter.;To learn and understand importance of maintaining oxygen saturations>88%;To learn and demonstrate proper pursed lip breathing techniques or other breathing techniques. ;To learn and demonstrate proper use of respiratory medications    Long  Term Goals Exhibits compliance with exercise, home  and travel O2 prescription;Verbalizes importance of monitoring SPO2 with pulse oximeter and return demonstration;Maintenance of O2 saturations>88%;Exhibits proper breathing techniques, such as pursed lip breathing or other method taught during program session;Compliance with respiratory medication;Demonstrates proper use of MDI's             Oxygen Re-Evaluation:  Oxygen Re-Evaluation     Row Name 05/29/21 0955 06/26/21 1051 07/10/21 1005         Program Oxygen Prescription   Program Oxygen Prescription E-Tanks E-Tanks E-Tanks     Liters per minute _0 Comments as needed for oxygen saturation below 88% as needed for oxygen saturation below 88% --       Home Oxygen   Home Oxygen Device -- -- Home Concentrator;Portable Concentrator     Sleep Oxygen Prescription CPAP;Continuous CPAP;Continuous CPAP;Continuous     Liters per minute _1 Home Exercise Oxygen Prescription Continuous Continuous Continuous     Liters per minute _2 Home Resting Oxygen Prescription Continuous Continuous None     Liters per minute _3 Compliance with Home Oxygen Use Yes Yes  Complient with night time use. Uses O2 as needed during the day, but not at all times. Yes       Goals/Expected Outcomes   Short Term Goals To learn and exhibit  compliance with exercise, home and travel O2 prescription;To learn and understand importance of monitoring SPO2 with pulse oximeter and demonstrate accurate use of the pulse oximeter.;To learn and understand importance of maintaining oxygen saturations>88%;To learn and demonstrate proper pursed lip breathing techniques or other breathing techniques.  To  learn and exhibit compliance with exercise, home and travel O2 prescription;To learn and understand importance of monitoring SPO2 with pulse oximeter and demonstrate accurate use of the pulse oximeter.;To learn and understand importance of maintaining oxygen saturations>88%;To learn and demonstrate proper pursed lip breathing techniques or other breathing techniques.  To learn and understand importance of maintaining oxygen saturations>88%;To learn and understand importance of monitoring SPO2 with pulse oximeter and demonstrate accurate use of the pulse oximeter.     Long  Term Goals Exhibits compliance with exercise, home  and travel O2 prescription;Verbalizes importance of monitoring SPO2 with pulse oximeter and return demonstration;Maintenance of O2 saturations>88%;Exhibits proper breathing techniques, such as pursed lip breathing or other method taught during program session;Compliance with respiratory medication Exhibits compliance with exercise, home  and travel O2 prescription;Verbalizes importance of monitoring SPO2 with pulse oximeter and return demonstration;Maintenance of O2 saturations>88%;Exhibits proper breathing techniques, such as pursed lip breathing or other method taught during program session;Compliance with respiratory medication Verbalizes importance of monitoring SPO2 with pulse oximeter and return demonstration;Maintenance of O2 saturations>88%     Comments Reviewed PLB technique with pt.  Talked about how it works and it's importance in maintaining their exercise saturations. Patient stated that she does not wear her oxygen at all times  during the day, but monitors her SaO2 levels and wears her O2 if her numbers drop below 88%. She reports most of the time that her SaO2 remains in the low 90's. She does consistently wear her oxygen at night while sleeping. She has a pulse oximeter to check her oxygen saturation at home. Informed  and explained why it is important to have one. Reviewed that oxygen saturations should be 88 percent and above. Patient verbalizes understanding.     Goals/Expected Outcomes Short: Become more profiecient at using PLB.   Long: Become independent at using PLB. Short: beocme more consistent with home O2 use. Long: become independent in controlling SOB with O2 useage and PLB techniques. Short: monitor oxygen at home with exertion. Long: maintain oxygen saturations above 88 percent independently.              Oxygen Discharge (Final Oxygen Re-Evaluation):  Oxygen Re-Evaluation - 07/10/21 1005       Program Oxygen Prescription   Program Oxygen Prescription E-Tanks    Liters per minute 3      Home Oxygen   Home Oxygen Device Home Concentrator;Portable Concentrator    Sleep Oxygen Prescription CPAP;Continuous    Liters per minute 3    Home Exercise Oxygen Prescription Continuous    Liters per minute 3    Home Resting Oxygen Prescription None    Liters per minute 3    Compliance with Home Oxygen Use Yes      Goals/Expected Outcomes   Short Term Goals To learn and understand importance of maintaining oxygen saturations>88%;To learn and understand importance of monitoring SPO2 with pulse oximeter and demonstrate accurate use of the pulse oximeter.    Long  Term Goals Verbalizes importance of monitoring SPO2 with pulse oximeter and return demonstration;Maintenance of O2 saturations>88%    Comments She has a pulse oximeter to check her oxygen saturation at home. Informed  and explained why it is important to have one. Reviewed that oxygen saturations should be 88 percent and above. Patient verbalizes  understanding.    Goals/Expected Outcomes Short: monitor oxygen at home with exertion. Long: maintain oxygen saturations above 88 percent independently.             Initial  Exercise Prescription:  Initial Exercise Prescription - 05/18/21 1500       Date of Initial Exercise RX and Referring Provider   Date 05/18/21    Referring Provider [name]      Oxygen   Oxygen Continuous    Liters 3    Maintain Oxygen Saturation 88% or higher      Treadmill   MPH 1.5    Grade 0    Minutes 15    METs 2.15      NuStep   Level 1    SPM 80    Minutes 15    METs 2      REL-XR   Level 1    Speed 50    Minutes 15    METs 2      T5 Nustep   Level 1    SPM 80    Minutes 15    METs 2      Track   Laps 20    Minutes 15    METs 2      Prescription Details   Frequency (times per week) 3    Duration Progress to 30 minutes of continuous aerobic without signs/symptoms of physical distress      Intensity   THRR 40-80% of Max Heartrate 112-135    Ratings of Perceived Exertion 11-13    Perceived Dyspnea 0-4      Resistance Training   Training Prescription Yes    Weight 3 lb    Reps 10-15             Perform Capillary Blood Glucose checks as needed.  Exercise Prescription Changes:   Exercise Prescription Changes     Row Name 05/18/21 1500 06/06/21 1400 06/19/21 1500 07/05/21 1100 07/17/21 1000     Response to Exercise   Blood Pressure (Admit) 118/68 128/76 122/76 110/70 122/64   Blood Pressure (Exercise) 138/84 132/60 138/62 130/60 122/62   Blood Pressure (Exit) 126/78 124/68 118/62 122/68 122/60   Heart Rate (Admit) 90 bpm 103 bpm 91 bpm 90 bpm 94 bpm   Heart Rate (Exercise) 120 bpm 114 bpm 113 bpm 115 bpm 116 bpm   Heart Rate (Exit) 100 bpm 110 bpm 93 bpm 98 bpm 107 bpm   Oxygen Saturation (Admit) 95 % 96 % 94 % 96 % 96 %   Oxygen Saturation (Exercise) 89 % 94 % 93 % 95 % 94 %   Oxygen Saturation (Exit) 98 % 96 % 97 % 96 % 95 %   Rating of Perceived Exertion  (Exercise) _0 Perceived Dyspnea (Exercise) _1 Symptoms -- -- SOB -- --   Duration -- Progress to 30 minutes of  aerobic without signs/symptoms of physical distress Progress to 30 minutes of  aerobic without signs/symptoms of physical distress Continue with 30 min of aerobic exercise without signs/symptoms of physical distress. Continue with 30 min of aerobic exercise without signs/symptoms of physical distress.   Intensity -- THRR unchanged THRR unchanged THRR unchanged THRR unchanged     Progression   Progression -- Continue to progress workloads to maintain intensity without signs/symptoms of physical distress. Continue to progress workloads to maintain intensity without signs/symptoms of physical distress. Continue to progress workloads to maintain intensity without signs/symptoms of physical distress. Continue to progress workloads to maintain intensity without signs/symptoms of physical distress.   Average METs -- 2.6 2.2 1.8 2.49     Resistance Training   Training  Prescription -- Yes Yes Yes Yes   Weight -- 3 lb 3 lb 3 lb 3 lb   Reps -- 10-15 10-15 10-15 10-15     Interval Training   Interval Training -- -- No No No     Oxygen   Oxygen -- Continuous Continuous Continuous Continuous   Liters -- _0 Treadmill   MPH -- 1.6 1.9 1.9 2.5   Grade -- 0 0 0 0   Minutes -- _1 METs -- 2.23 2.45 2.45 2.91     NuStep   Level -- -- -- -- 3   Minutes -- -- -- -- 15   METs -- -- -- -- 2.4     Arm Ergometer   Level -- -- -- -- 4   Minutes -- -- -- -- 15   METs -- -- -- -- 1.2     REL-XR   Level -- 1 -- 1 1   Minutes -- 15 -- 15 15   METs -- 3 -- 1.2 2     T5 Nustep   Level -- -- 2 -- 2   Minutes -- -- 15 -- 15   METs -- -- 2 -- 2     Oxygen   Maintain Oxygen Saturation -- 88% or higher 88% or higher 88% or higher 88% or higher            Exercise Comments:   Exercise Comments     Row Name 05/29/21 0954           Exercise  Comments First full day of exercise!  Patient was oriented to gym and equipment including functions, settings, policies, and procedures.  Patient's individual exercise prescription and treatment plan were reviewed.  All starting workloads were established based on the results of the 6 minute walk test done at initial orientation visit.  The plan for exercise progression was also introduced and progression will be customized based on patient's performance and goals.                Exercise Goals and Review:   Exercise Goals     Row Name 05/18/21 1552             Exercise Goals   Increase Physical Activity Yes       Intervention Provide advice, education, support and counseling about physical activity/exercise needs.;Develop an individualized exercise prescription for aerobic and resistive training based on initial evaluation findings, risk stratification, comorbidities and participant's personal goals.       Expected Outcomes Short Term: Attend rehab on a regular basis to increase amount of physical activity.;Long Term: Add in home exercise to make exercise part of routine and to increase amount of physical activity.;Long Term: Exercising regularly at least 3-5 days a week.       Increase Strength and Stamina Yes       Intervention Provide advice, education, support and counseling about physical activity/exercise needs.;Develop an individualized exercise prescription for aerobic and resistive training based on initial evaluation findings, risk stratification, comorbidities and participant's personal goals.       Expected Outcomes Short Term: Increase workloads from initial exercise prescription for resistance, speed, and METs.;Short Term: Perform resistance training exercises routinely during rehab and add in resistance training at home;Long Term: Improve cardiorespiratory fitness, muscular endurance and strength as measured by increased METs and functional capacity (6MWT)       Able to  understand and use  rate of perceived exertion (RPE) scale Yes       Intervention Provide education and explanation on how to use RPE scale       Expected Outcomes Short Term: Able to use RPE daily in rehab to express subjective intensity level;Long Term:  Able to use RPE to guide intensity level when exercising independently       Able to understand and use Dyspnea scale Yes       Intervention Provide education and explanation on how to use Dyspnea scale       Expected Outcomes Short Term: Able to use Dyspnea scale daily in rehab to express subjective sense of shortness of breath during exertion;Long Term: Able to use Dyspnea scale to guide intensity level when exercising independently       Knowledge and understanding of Target Heart Rate Range (THRR) Yes       Intervention Provide education and explanation of THRR including how the numbers were predicted and where they are located for reference       Expected Outcomes Short Term: Able to state/look up THRR;Short Term: Able to use daily as guideline for intensity in rehab;Long Term: Able to use THRR to govern intensity when exercising independently       Able to check pulse independently Yes       Intervention Provide education and demonstration on how to check pulse in carotid and radial arteries.;Review the importance of being able to check your own pulse for safety during independent exercise       Expected Outcomes Short Term: Able to explain why pulse checking is important during independent exercise;Long Term: Able to check pulse independently and accurately       Understanding of Exercise Prescription Yes       Intervention Provide education, explanation, and written materials on patient's individual exercise prescription       Expected Outcomes Short Term: Able to explain program exercise prescription;Long Term: Able to explain home exercise prescription to exercise independently                Exercise Goals Re-Evaluation :  Exercise  Goals Re-Evaluation     Row Name 05/29/21 0954 06/06/21 1422 06/19/21 1514 06/26/21 1034 07/05/21 1200     Exercise Goal Re-Evaluation   Exercise Goals Review Increase Physical Activity;Able to understand and use rate of perceived exertion (RPE) scale;Knowledge and understanding of Target Heart Rate Range (THRR);Understanding of Exercise Prescription;Increase Strength and Stamina;Able to understand and use Dyspnea scale;Able to check pulse independently Increase Physical Activity;Increase Strength and Stamina Increase Physical Activity;Increase Strength and Stamina;Understanding of Exercise Prescription Increase Physical Activity;Increase Strength and Stamina;Understanding of Exercise Prescription --   Comments Reviewed RPE and dyspnea scales, THR and program prescription with pt today.  Pt voiced understanding and was given a copy of goals to take home. Payal is doing well so far.  She has increased to 1.6 on TM.  Oxygen has stayed int ths 90s on 3 L during exercise. [place] has only attended once since last review.  She is currently out with COVID.  We will continue to monitor her progress upon return (hopefully next week). Trinady has returned to class since having COVID. This was her first day back She tolerated exercise well and felt like she should be able to get back to a good start attending rehab on a regular basis. Maizey has increased TM to 1.9 mph.  She is ready to increase load on XR.  Staff will encourage moving to level  2-3 on XR.   Expected Outcomes Short: Use RPE daily to regulate intensity. Long: Follow program prescription in THR. Short:  attend consistently Long:improve overall stamina Short: Return to consistent attendance Long: Continue to improve stamina Short: return to consistent rehab attendance. Long: Continue to [employer]. Short: increase level on XR Long:  increase overall MET level    Row Name 07/17/21 1030             Exercise Goal Re-Evaluation   Exercise  Goals Review Increase Physical Activity;Increase Strength and Stamina;Understanding of Exercise Prescription       Comments Aniya is doing well in rehab. She has already increased her treadmill speed to 2.5 mph. She should be encouraged to add a small incline to the treadmill. She does hit her [place] most sessions. Will continue to monitor.       Expected Outcomes Short: Add incline to treadmill Long: Continue to build up overall strength and stamina                Discharge Exercise Prescription (Final Exercise Prescription Changes):  Exercise Prescription Changes - 07/17/21 1000       Response to Exercise   Blood Pressure (Admit) 122/64    Blood Pressure (Exercise) 122/62    Blood Pressure (Exit) 122/60    Heart Rate (Admit) 94 bpm    Heart Rate (Exercise) 116 bpm    Heart Rate (Exit) 107 bpm    Oxygen Saturation (Admit) 96 %    Oxygen Saturation (Exercise) 94 %    Oxygen Saturation (Exit) 95 %    Rating of Perceived Exertion (Exercise) 11    Perceived Dyspnea (Exercise) 1    Duration Continue with 30 min of aerobic exercise without signs/symptoms of physical distress.    Intensity THRR unchanged      Progression   Progression Continue to progress workloads to maintain intensity without signs/symptoms of physical distress.    Average METs 2.49      Resistance Training   Training Prescription Yes    Weight 3 lb    Reps 10-15      Interval Training   Interval Training No      Oxygen   Oxygen Continuous    Liters 3      Treadmill   MPH 2.5    Grade 0    Minutes 15    METs 2.91      NuStep   Level 3    Minutes 15    METs 2.4      Arm Ergometer   Level 4    Minutes 15    METs 1.2      REL-XR   Level 1    Minutes 15    METs 2      T5 Nustep   Level 2    Minutes 15    METs 2      Oxygen   Maintain Oxygen Saturation 88% or higher             Nutrition:  Target Goals: Understanding of nutrition guidelines, daily intake of sodium '1500mg'$ ,  cholesterol '200mg'$ , calories 30% from fat and 7% or less from saturated fats, daily to have 5 or more servings of fruits and vegetables.  Education: All About Nutrition: -Group instruction provided by verbal, written material, interactive activities, discussions, models, and posters to present general guidelines for heart healthy nutrition including fat, fiber, MyPlate, the role of sodium in heart healthy nutrition, utilization of the nutrition label, and  utilization of this knowledge for meal planning. Follow up email sent as well. Written material given at graduation. Flowsheet Row Pulmonary Rehab from 07/12/2021 in [facility] Cardiac and Pulmonary Rehab  Date 07/12/21  Educator [facility]  Instruction Review Code 1- Verbalizes Understanding       Biometrics:  Pre Biometrics - 05/18/21 1552       Pre Biometrics   Height 5' 5.5" (1.664 m)    Weight 203 lb 3.2 oz (92.2 kg)    BMI (Calculated) 33.29    Single Leg Stand 2.35 seconds              Nutrition Therapy Plan and Nutrition Goals:  Nutrition Therapy & Goals - 07/24/21 1048       Nutrition Therapy   Diet Heart healthy, low Na, pulmonary MNT, T2DM    Protein (specify units) 110g    Fiber 25 grams    Whole Grain Foods 3 servings    Saturated Fats 12 max. grams    Fruits and Vegetables 8 servings/day    Sodium 1.5 grams      Personal Nutrition Goals   Nutrition Goal ST: work on adding things to her snacks (protein, fiber, or fat)  LT: Continue to keep A1C </= 7, meet protein needs, combine fiber/fat/protein    Comments PYP 61. Last A1C 7.2 on metformin. Had a cholecystectomy in 2009 and continues to eat lower amounts of fat. She likes salty and sweet foods and she feels that gets in the way of her eating healthy sometimes. B: protein shake: almond milk, strawberris, banana, protein powder  or oikos yogurt with granola L: 1/2 sandwich with fries or chips D: meat (chicken mostly) and salad or a chicken salad. She uses olive oil, butter,  and sometimes bacon in her green beans. Doesn't eat many fried foods. Her weight is stable. She reports that if her husband and her were to go out to the movies they would split popcorn and candy, discussed maybe having some popcorn and then bringing some peanuts - the extra fiber, fat, and protein with help to balance this snack/meal better so it is less carbohydrates and she has a better blood sugar response as well as increasing her protein intake. Discussed heart healthy/T2DM eating as well as pulmonary MNT.      Intervention Plan   Intervention Prescribe, educate and counsel regarding individualized specific dietary modifications aiming towards targeted core components such as weight, hypertension, lipid management, diabetes, heart failure and other comorbidities.    Expected Outcomes Short Term Goal: Understand basic principles of dietary content, such as calories, fat, sodium, cholesterol and nutrients.;Short Term Goal: A plan has been developed with personal nutrition goals set during dietitian appointment.;Long Term Goal: Adherence to prescribed nutrition plan.             Nutrition Assessments:  MEDIFICTS Score Key: ?70 Need to make dietary changes  40-70 Heart Healthy Diet ? 40 Therapeutic Level Cholesterol Diet  Flowsheet Row Pulmonary Rehab from 05/18/2021 in [facility] Cardiac and Pulmonary Rehab  Picture Your Plate Total Score on Admission 61      Picture Your Plate Scores: <21 Unhealthy dietary pattern with much room for improvement. 41-50 Dietary pattern unlikely to meet recommendations for good health and room for improvement. 51-60 More healthful dietary pattern, with some room for improvement.  >60 Healthy dietary pattern, although there may be some specific behaviors that could be improved.   Nutrition Goals Re-Evaluation:  Nutrition Goals Re-Evaluation     Row  Name 06/26/21 1045 07/10/21 1003           Goals   Current Weight -- 201 lb (91.2 kg)      Nutrition  Goal -- meet with dietitian      Comment Patient has not yet met with program dietician. Patient has not yet met with program dietician.      Expected Outcome Short: meed with dieticain. Short: meed with dieticain. Long: adhere to a diet that pertains to patient.               Nutrition Goals Discharge (Final Nutrition Goals Re-Evaluation):  Nutrition Goals Re-Evaluation - 07/10/21 1003       Goals   Current Weight 201 lb (91.2 kg)    Nutrition Goal meet with dietitian    Comment Patient has not yet met with program dietician.    Expected Outcome Short: meed with dieticain. Long: adhere to a diet that pertains to patient.             Psychosocial: Target Goals: Acknowledge presence or absence of significant depression and/or stress, maximize coping skills, provide positive support system. Participant is able to verbalize types and ability to use techniques and skills needed for reducing stress and depression.   Education: Stress, Anxiety, and Depression - Group verbal and visual presentation to define topics covered.  Reviews how body is impacted by stress, anxiety, and depression.  Also discusses healthy ways to reduce stress and to treat/manage anxiety and depression.  Written material given at graduation.   Education: Sleep Hygiene -Provides group verbal and written instruction about how sleep can affect your health.  Define sleep hygiene, discuss sleep cycles and impact of sleep habits. Review good sleep hygiene tips.    Initial Review & Psychosocial Screening:  Initial Psych Review & Screening - 05/04/21 1453       Initial Review   Current issues with None Identified      Family Dynamics   Good Support System? Yes   husband and children  2 nearby  one in [place]     Barriers   Psychosocial barriers to participate in program There are no identifiable barriers or psychosocial needs.      Screening Interventions   Interventions Encouraged to exercise;To provide  support and resources with identified psychosocial needs;Provide feedback about the scores to participant    Expected Outcomes Short Term goal: Utilizing psychosocial counselor, staff and physician to assist with identification of specific Stressors or current issues interfering with healing process. Setting desired goal for each stressor or current issue identified.;Long Term Goal: Stressors or current issues are controlled or eliminated.;Short Term goal: Identification and review with participant of any Quality of Life or Depression concerns found by scoring the questionnaire.;Long Term goal: The participant improves quality of Life and PHQ9 Scores as seen by post scores and/or verbalization of changes             Quality of Life Scores:  Scores of 19 and below usually indicate a poorer quality of life in these areas.  A difference of  2-3 points is a clinically meaningful difference.  A difference of 2-3 points in the total score of the Quality of Life Index has been associated with significant improvement in overall quality of life, self-image, physical symptoms, and general health in studies assessing change in quality of life.  PHQ-9: Recent Review Flowsheet Data     Depression screen [facility] 2/9 07/17/2021 07/10/2021 06/26/2021 05/18/2021 04/13/2021   Decreased Interest 0  _0 0   Down, Depressed, Hopeless 0 0 1 1 0   PHQ - 2 Score 0 _1 0   Altered sleeping _2 -   Tired, decreased energy _3 -   Change in appetite _4 -   Feeling bad or failure about yourself  0 0 0 1 -   Trouble concentrating 0 0 1 1 -   Moving slowly or fidgety/restless 0 0 1 0 -   Suicidal thoughts 0 0 0 0 -   PHQ-9 Score _5 -   Difficult doing work/chores Not difficult at all Not difficult at all Not difficult at all Somewhat difficult -      Interpretation of Total Score  Total Score Depression Severity:  1-4 = Minimal depression, 5-9 = Mild depression, 10-14 = Moderate depression, 15-19  = Moderately severe depression, 20-27 = Severe depression   Psychosocial Evaluation and Intervention:  Psychosocial Evaluation - 05/04/21 1505       Psychosocial Evaluation & Interventions   Interventions Encouraged to exercise with the program and follow exercise prescription    Comments Meiko has no barriers to attending the program. She wants to gain back some of her staamina and improve her breathing. She lives with her husband of 15 years. She has him as part of her support, as well as 3 children- 2 nearby and one in [place]. She had been in the program in 2018 and is ready to do what she can to acheive her goals.    Expected Outcomes STG: Hala is able to attend all scheduled sessions. She is able to see improvment in her breathing and stamina. LTG: Loralee continues with her exercise progress and symptom control    Continue Psychosocial Services  Follow up required by staff             Psychosocial Re-Evaluation:  Psychosocial Re-Evaluation     Becker Name 06/26/21 1046 07/10/21 1001 07/17/21 1033         Psychosocial Re-Evaluation   Comments Patient reports no new stress or sleep concerns. She did a PHQ reevaluation with a score of 8, but attributes most of her difficulties to her physical condition, not to mental health concerns. Reviewed patient health questionnaire (PHQ-9) with patient for follow up. Previously, patients score indicated signs/symptoms of depression.  Reviewed to see if patient is improving symptom wise while in program.  Score declined and patient states that it is because she is not sleeping well and feels like she has little energy. Reviewed patient health questionnaire (PHQ-9) with patient for follow up. Previously, patients score indicated signs/symptoms of depression.  Reviewed to see if patient is improving symptom wise while in program.  Score declined and patient states that it is because she is not sleeping well and feels like she has little energy.      Expected Outcomes Short: continue to attend pulmonary rehab consistently. Long: maintian good metal health habits. Short: Continue to work toward an improvement in [place] scores by attending [facility] regularly. Long: Continue to improve stress and depression coping skills by talking with staff and attending [facility] regularly and work toward a positive mental state. Short: Continue to work toward an improvement in [place] scores by attending [facility] regularly. Long: Continue to improve stress and depression coping skills by talking with staff and attending [facility] regularly and work toward a positive mental state.     Interventions Encouraged to attend  Pulmonary Rehabilitation for the exercise Encouraged to attend Pulmonary Rehabilitation for the exercise --     Continue Psychosocial Services  Follow up required by staff Follow up required by staff --              Psychosocial Discharge (Final Psychosocial Re-Evaluation):  Psychosocial Re-Evaluation - 07/17/21 1033       Psychosocial Re-Evaluation   Comments Reviewed patient health questionnaire (PHQ-9) with patient for follow up. Previously, patients score indicated signs/symptoms of depression.  Reviewed to see if patient is improving symptom wise while in program.  Score declined and patient states that it is because she is not sleeping well and feels like she has little energy.    Expected Outcomes Short: Continue to work toward an improvement in [place] scores by attending [facility] regularly. Long: Continue to improve stress and depression coping skills by talking with staff and attending [facility] regularly and work toward a positive mental state.             Education: Education Goals: Education classes will be provided on a weekly basis, covering required topics. Participant will state understanding/return demonstration of topics presented.  Learning Barriers/Preferences:  Learning Barriers/Preferences - 05/04/21 1456        Learning Barriers/Preferences   Learning Barriers None    Learning Preferences None             General Pulmonary Education Topics:  Infection Prevention: - Provides verbal and written material to individual with discussion of infection control including proper hand washing and proper equipment cleaning during exercise session. Flowsheet Row Pulmonary Rehab from 07/12/2021 in [facility] Cardiac and Pulmonary Rehab  Date 05/18/21  Educator AS  Instruction Review Code 1- Verbalizes Understanding       Falls Prevention: - Provides verbal and written material to individual with discussion of falls prevention and safety. Flowsheet Row Pulmonary Rehab from 07/12/2021 in [facility] Cardiac and Pulmonary Rehab  Date 05/18/21  Educator AS  Instruction Review Code 1- Verbalizes Understanding       Chronic Lung Disease Review: - Group verbal instruction with posters, models, PowerPoint presentations and videos,  to review new updates, new respiratory medications, new advancements in procedures and treatments. Providing information on websites and "800" numbers for continued self-education. Includes information about supplement oxygen, available portable oxygen systems, continuous and intermittent flow rates, oxygen safety, concentrators, and Medicare reimbursement for oxygen. Explanation of Pulmonary Drugs, including class, frequency, complications, importance of spacers, rinsing mouth after steroid MDI's, and proper cleaning methods for nebulizers. Review of basic lung anatomy and physiology related to function, structure, and complications of lung disease. Review of risk factors. Discussion about methods for diagnosing sleep apnea and types of masks and machines for OSA. Includes a review of the use of types of environmental controls: home humidity, furnaces, filters, dust mite/pet prevention, HEPA vacuums. Discussion about weather changes, air quality and the benefits of nasal washing. Instruction on  Warning signs, infection symptoms, calling MD promptly, preventive modes, and value of vaccinations. Review of effective airway clearance, coughing and/or vibration techniques. Emphasizing that all should Create an Action Plan. Written material given at graduation. Flowsheet Row Pulmonary Rehab from 07/12/2021 in [facility] Cardiac and Pulmonary Rehab  Date 05/31/21  Educator [facility]  Instruction Review Code 1- Verbalizes Understanding       AED/CPR: - Group verbal and written instruction with the use of models to demonstrate the basic use of the AED with the basic ABC's of resuscitation.    Anatomy and Cardiac  Procedures: - Group verbal and visual presentation and models provide information about basic cardiac anatomy and function. Reviews the testing methods done to diagnose heart disease and the outcomes of the test results. Describes the treatment choices: Medical Management, Angioplasty, or Coronary Bypass Surgery for treating various heart conditions including Myocardial Infarction, Angina, Valve Disease, and Cardiac Arrhythmias.  Written material given at graduation.   Medication Safety: - Group verbal and visual instruction to review commonly prescribed medications for heart and lung disease. Reviews the medication, class of the drug, and side effects. Includes the steps to properly store meds and maintain the prescription regimen.  Written material given at graduation.   Other: -Provides group and verbal instruction on various topics (see comments)   Knowledge Questionnaire Score:  Knowledge Questionnaire Score - 05/18/21 1555       Knowledge Questionnaire Score   Pre Score 15/18              Core Components/Risk Factors/Patient Goals at Admission:  Personal Goals and Risk Factors at Admission - 05/18/21 1600       Core Components/Risk Factors/Patient Goals on Admission    Weight Management Yes    Intervention Weight Management: Develop a combined nutrition and exercise  program designed to reach desired caloric intake, while maintaining appropriate intake of nutrient and fiber, sodium and fats, and appropriate energy expenditure required for the weight goal.;Weight Management/Obesity: Establish reasonable short term and long term weight goals.    Admit Weight 204 lb (92.5 kg)    Goal Weight: Short Term 200 lb (90.7 kg)    Goal Weight: Long Term 150 lb (68 kg)    Expected Outcomes Short Term: Continue to assess and modify interventions until short term weight is achieved;Long Term: Adherence to nutrition and physical activity/exercise program aimed toward attainment of established weight goal;Weight Loss: Understanding of general recommendations for a balanced deficit meal plan, which promotes 1-2 lb weight loss per week and includes a negative energy balance of [phone] kcal/d    Improve shortness of breath with ADL's Yes    Intervention Provide education, individualized exercise plan and daily activity instruction to help decrease symptoms of SOB with activities of daily living.    Expected Outcomes Short Term: Improve cardiorespiratory fitness to achieve a reduction of symptoms when performing ADLs;Long Term: Be able to perform more ADLs without symptoms or delay the onset of symptoms    Intervention Provide education, demonstration and support about specific breathing techniuqes utilized for more efficient breathing. Include techniques such as pursed lipped breathing, diaphragmatic breathing and self-pacing activity.    Expected Outcomes Short Term: Participant will be able to demonstrate and use breathing techniques as needed throughout daily activities.    Increase knowledge of respiratory medications and ability to use respiratory devices properly  Yes    Intervention Provide education and demonstration as needed of appropriate use of medications, inhalers, and oxygen therapy.    Expected Outcomes Short Term: Achieves understanding of medications use. Understands  that oxygen is a medication prescribed by physician. Demonstrates appropriate use of inhaler and oxygen therapy.;Long Term: Maintain appropriate use of medications, inhalers, and oxygen therapy.    Diabetes Yes    Intervention Provide education about signs/symptoms and action to take for hypo/hyperglycemia.;Provide education about proper nutrition, including hydration, and aerobic/resistive exercise prescription along with prescribed medications to achieve blood glucose in normal ranges: Fasting glucose 65-99 mg/dL    Expected Outcomes Short Term: Participant verbalizes understanding of the signs/symptoms and immediate care of hyper/hypoglycemia,  proper foot care and importance of medication, aerobic/resistive exercise and nutrition plan for blood glucose control.;Long Term: Attainment of HbA1C < 7%.    Heart Failure Yes    Intervention Provide a combined exercise and nutrition program that is supplemented with education, support and counseling about heart failure. Directed toward relieving symptoms such as shortness of breath, decreased exercise tolerance, and extremity edema.    Expected Outcomes Improve functional capacity of life;Short term: Attendance in program 2-3 days a week with increased exercise capacity. Reported lower sodium intake. Reported increased fruit and vegetable intake. Reports medication compliance.;Short term: Daily weights obtained and reported for increase. Utilizing diuretic protocols set by physician.;Long term: Adoption of self-care skills and reduction of barriers for early signs and symptoms recognition and intervention leading to self-care maintenance.    Hypertension Yes    Intervention Provide education on lifestyle modifcations including regular physical activity/exercise, weight management, moderate sodium restriction and increased consumption of fresh fruit, vegetables, and low fat dairy, alcohol moderation, and smoking cessation.;Monitor prescription use compliance.     Expected Outcomes Short Term: Continued assessment and intervention until BP is < 140/86m HG in hypertensive participants. < 130/865mHG in hypertensive participants with diabetes, heart failure or chronic kidney disease.;Long Term: Maintenance of blood pressure at goal levels.    Lipids Yes    Intervention Provide education and support for participant on nutrition & aerobic/resistive exercise along with prescribed medications to achieve LDL <7037mHDL >68m7m  Expected Outcomes Short Term: Participant states understanding of desired cholesterol values and is compliant with medications prescribed. Participant is following exercise prescription and nutrition guidelines.;Long Term: Cholesterol controlled with medications as prescribed, with individualized exercise RX and with personalized nutrition plan. Value goals: LDL < 70mg59mL > 40 mg.             Education:Diabetes - Individual verbal and written instruction to review signs/symptoms of diabetes, desired ranges of glucose level fasting, after meals and with exercise. Acknowledge that pre and post exercise glucose checks will be done for 3 sessions at entry of program. Flowsheet [facility]  Date 05/18/21  Educator AS  Instruction Review Code 1- Verbalizes Understanding       Know Your Numbers and Heart Failure: - Group verbal and visual instruction to discuss disease risk factors for cardiac and pulmonary disease and treatment options.  Reviews associated critical values for Overweight/Obesity, Hypertension, Cholesterol, and Diabetes.  Discusses basics of heart failure: signs/symptoms and treatments.  Introduces Heart Failure Zone chart for action plan for heart failure.  Written material given at graduation.   Core Components/Risk Factors/Patient Goals Review:   Goals and Risk Factor Review     Row Name 06/26/21 1041 07/10/21 1004           Core Components/Risk  Factors/Patient Goals Review   Personal Goals Review Weight Management/Obesity;Develop more efficient breathing techniques such as purse lipped breathing and diaphragmatic breathing and practicing self-pacing with activity.;Heart Failure;Increase knowledge of respiratory medications and ability to use respiratory devices properly.;Hypertension;Improve shortness of breath with ADL's;Diabetes;Lipids Improve shortness of breath with ADL's      Review Patient reports reports that she is taking all her medications. She stated that her Diabetes medication bothers her stomach, and since having COVID it has made it even worse. She is working on building back to her full dose but at this current time she is long able to tolerate a half dose. She is keeping a  close eye on her blood sugar as she works back up to her full dose. Patient does not weigh every day and she was encouraged to do so to keep an eye on weight and fluid levels. Spoke to patient about their shortness of breath and what they can do to improve. Patient has been informed of breathing techniques when starting the program. Patient is informed to tell staff if they have had any med changes and that certain meds they are taking or not taking can be causing shortness of breath.      Expected Outcomes Short: work back up to full dose of DM medications and communicate any concerns with doctor. Weight every day. Long: continue improvements with SOB and continue to manage disease with medical care team. Short: Attend [facility] regularly to improve shortness of breath with ADL's. Long: maintain independence with ADL's               Core Components/Risk Factors/Patient Goals at Discharge (Final Review):   Goals and Risk Factor Review - 07/10/21 1004       Core Components/Risk Factors/Patient Goals Review   Personal Goals Review Improve shortness of breath with ADL's    Review Spoke to patient about their shortness of breath and what they can do to  improve. Patient has been informed of breathing techniques when starting the program. Patient is informed to tell staff if they have had any med changes and that certain meds they are taking or not taking can be causing shortness of breath.    Expected Outcomes Short: Attend [facility] regularly to improve shortness of breath with ADL's. Long: maintain independence with ADL's             ITP Comments:  ITP Comments     Row Name 05/04/21 1511 05/18/21 1606 05/29/21 0954 05/31/21 1429 06/19/21 1513   ITP Comments Virtual orientation call completed today. shehas an appointment on Date: 05/18/2021  for EP eval and gym Orientation.  Documentation of diagnosis can be found in [facility] Date:   02/07/2021 Completed 6MWT and gym orientation. Initial ITP created and sent for review to Dr. Ottie Glazier, Medical Director. First full day of exercise!  Patient was oriented to gym and equipment including functions, settings, policies, and procedures.  Patient's individual exercise prescription and treatment plan were reviewed.  All starting workloads were established based on the results of the 6 minute walk test done at initial orientation visit.  The plan for exercise progression was also introduced and progression will be customized based on patient's performance and goals. 30 day review completed. ITP sent to Dr. Zetta Bills, Medical Director of  Pulmonary Rehab. Continue with ITP unless changes are made by physician. Pt is currently out with COVID.  She is due to return on 10/31    Row Name 06/28/21 0815 07/24/21 1153 07/26/21 0751       ITP Comments 30 Day review completed. Medical Director ITP review done, changes made as directed, and signed approval by Medical Director. Completed initial RD consultation 30 Day review completed. Medical Director ITP review done, changes made as directed, and signed approval by Medical Director.              Comments:

## 2021-07-28 ENCOUNTER — Encounter: Payer: Medicare PPO | Attending: Emergency Medicine

## 2021-07-28 DIAGNOSIS — J984 Other disorders of lung: Secondary | ICD-10-CM | POA: Insufficient documentation

## 2021-07-31 ENCOUNTER — Encounter: Payer: Medicare PPO | Admitting: *Deleted

## 2021-07-31 ENCOUNTER — Other Ambulatory Visit: Payer: Self-pay

## 2021-07-31 DIAGNOSIS — J984 Other disorders of lung: Secondary | ICD-10-CM | POA: Diagnosis not present

## 2021-07-31 NOTE — Progress Notes (Signed)
Daily Session Note  Patient Details  Name: Betty [name] MRN: [id number] Date of Birth: [birth year] Referring Provider:   April Manson Pulmonary Rehab from 05/18/2021 in [facility] Cardiac and Pulmonary Rehab  Referring Provider [name]       Encounter Date: 07/31/2021  Check In:  Session Check In - 07/31/21 1053       Check-In   Supervising physician immediately available to respond to emergencies See telemetry face sheet for immediately available ER MD    Location [facility]-Cardiac & Pulmonary Rehab    Staff Present Heath Lark, RN, BSN, Laveda Norman, BS, ACSM CEP, Exercise Physiologist;Joseph [place], Fabio Neighbors, MPA, RN    Virtual Visit No    Medication changes reported     No    Fall or balance concerns reported    No    Warm-up and Cool-down Performed on first and last piece of equipment    Resistance Training Performed Yes    VAD Patient? No    PAD/SET Patient? No      Pain Assessment   Currently in Pain? No/denies                Social History   Tobacco Use  Smoking Status Never  Smokeless Tobacco Never    Goals Met:  Proper associated with RPD/PD & O2 Sat Independence with exercise equipment Exercise tolerated well No report of concerns or symptoms today  Goals Unmet:  Not Applicable  Comments: Pt able to follow exercise prescription today without complaint.  Will continue to monitor for progression.    Dr. Emily Filbert is Medical Director for [place].  Dr. Ottie Glazier is Medical Director for [facility] Pulmonary Rehabilitation.

## 2021-08-03 DIAGNOSIS — J9610 Chronic respiratory failure, unspecified whether with hypoxia or hypercapnia: Secondary | ICD-10-CM | POA: Diagnosis not present

## 2021-08-07 ENCOUNTER — Other Ambulatory Visit: Payer: Self-pay

## 2021-08-07 DIAGNOSIS — J984 Other disorders of lung: Secondary | ICD-10-CM | POA: Diagnosis not present

## 2021-08-07 NOTE — Progress Notes (Signed)
Daily Session Note  Patient Details  Name: Betty [name] MRN: [id number] Date of Birth: [birth year] Referring Provider:   April Manson Pulmonary Rehab from 05/18/2021 in [facility] Cardiac and Pulmonary Rehab  Referring Provider [name]       Encounter Date: 08/07/2021  Check In:  Session Check In - 08/07/21 1007       Check-In   Supervising physician immediately available to respond to emergencies See telemetry face sheet for immediately available ER MD    Location [facility]-Cardiac & Pulmonary Rehab    Staff Present [name], MPA, RN;Joseph Dundee, RCP,RRT,BSRT;Perle Brickhouse [place], BS, ACSM CEP, Exercise Physiologist    Virtual Visit No    Medication changes reported     No    Fall or balance concerns reported    No    Warm-up and Cool-down Performed on first and last piece of equipment    Resistance Training Performed Yes    VAD Patient? No    PAD/SET Patient? No      Pain Assessment   Currently in Pain? No/denies                Social History   Tobacco Use  Smoking Status Never  Smokeless Tobacco Never    Goals Met:  Independence with exercise equipment Exercise tolerated well No report of concerns or symptoms today Strength training completed today  Goals Unmet:  Not Applicable  Comments: Pt able to follow exercise prescription today without complaint.  Will continue to monitor for progression.    Dr. Emily Filbert is Medical Director for [facility].  Dr. Ottie Glazier is Medical Director for [facility] Pulmonary Rehabilitation.

## 2021-08-09 ENCOUNTER — Other Ambulatory Visit: Payer: Self-pay

## 2021-08-09 ENCOUNTER — Other Ambulatory Visit: Payer: Self-pay | Admitting: Family Medicine

## 2021-08-09 DIAGNOSIS — J984 Other disorders of lung: Secondary | ICD-10-CM | POA: Diagnosis not present

## 2021-08-09 DIAGNOSIS — E782 Mixed hyperlipidemia: Secondary | ICD-10-CM

## 2021-08-09 NOTE — Progress Notes (Signed)
Daily Session Note  Patient Details  Name: LACEY WALLMAN MRN: [id number] Date of Birth: 01/25/1946 Referring Provider:   April Manson Pulmonary Rehab from 05/18/2021 in [facility] Cardiac and Pulmonary Rehab  Referring Provider [name]       Encounter Date: 08/09/2021  Check In:  Session Check In - 08/09/21 0949       Check-In   Supervising physician immediately available to respond to emergencies See telemetry face sheet for immediately available ER MD    Location [facility]-Cardiac & Pulmonary Rehab    Staff Present [name], MPA, Elveria Rising, BA, ACSM CEP, Exercise Physiologist;Joseph Tessie Fass, [place]    Virtual Visit No    Medication changes reported     No    Fall or balance concerns reported    No    Warm-up and Cool-down Performed on first and last piece of equipment    Resistance Training Performed Yes    VAD Patient? No    PAD/SET Patient? No      Pain Assessment   Currently in Pain? No/denies                Social History   Tobacco Use  Smoking Status Never  Smokeless Tobacco Never    Goals Met:  Independence with exercise equipment Exercise tolerated well No report of concerns or symptoms today Strength training completed today  Goals Unmet:  Not Applicable  Comments: Pt able to follow exercise prescription today without complaint.  Will continue to monitor for progression.    Dr. Emily Filbert is Medical Director for [facility].  Dr. Ottie Glazier is Medical Director for [facility] Pulmonary Rehabilitation.

## 2021-08-23 ENCOUNTER — Encounter: Payer: Self-pay | Admitting: *Deleted

## 2021-08-23 ENCOUNTER — Telehealth: Payer: Self-pay

## 2021-08-23 DIAGNOSIS — J984 Other disorders of lung: Secondary | ICD-10-CM

## 2021-08-23 NOTE — Progress Notes (Signed)
Pulmonary Individual Treatment Plan  Patient Details  Name: Betty [name] MRN: [id number] Date of Birth: 04/16/1946 Referring Provider:   April Manson Pulmonary Rehab from 05/18/2021 in [facility] Cardiac and Pulmonary Rehab  Referring Provider [name]       Initial Encounter Date:  Flowsheet Row Pulmonary Rehab from 05/18/2021 in [facility] Cardiac and Pulmonary Rehab  Date 05/18/21       Visit Diagnosis: Restrictive lung disease  Patient's Home Medications on Admission:  Current Outpatient Medications:    acetic acid 2 % otic solution, Insert saturated wick of cotton; keep moist 24 hours by adding 3 to 5 drops every 4 to 6 hours; remove wick after 24 hours and instill 5 drops 3 to 4 times daily, Disp: 15 mL, Rfl: 0   albuterol (VENTOLIN HFA) 108 (90 Base) MCG/ACT inhaler, Inhale 2 puffs into the lungs every 4 (four) hours as needed for wheezing or shortness of breath., Disp: 8 g, Rfl: 6   aspirin 81 MG EC tablet, Take 1 tablet (81 mg total) by mouth daily., Disp: 90 tablet, Rfl: 2   CALCIUM-MAGNESIUM-VITAMIN D PO, Take 1 tablet by mouth daily., Disp: , Rfl:    carvedilol (COREG) 12.5 MG tablet, TAKE 1 TABLET BY MOUTH TWICE A DAY WITH MEALS, Disp: 60 tablet, Rfl: 11   clobetasol (TEMOVATE) 0.05 % GEL, Apply topically 2 (two) times daily., Disp: , Rfl:    Coenzyme Q10 (CO Q 10 PO), Take 200 mg by mouth at bedtime. , Disp: , Rfl:    Colchicine (MITIGARE) 0.6 MG CAPS, Day 1: take 1.2 mg (2 tablets) by mouth at the first sign of flare, followed by 0.6 mg (1 tablet) by mouth after 1 hour. Day 2 and thereafter: take 0.6 mg (1 tablet) by mouth daily until flare has resolved., Disp: 30 capsule, Rfl: 0   Fish Oil OIL, Take 1,200 mg by mouth 2 (two) times daily. GUMMIES, Disp: , Rfl:    fluticasone (FLONASE) 50 MCG/ACT nasal spray, Place 2 sprays into both nostrils daily., Disp: 16 g, Rfl: 6   losartan (COZAAR) 50 MG tablet, TAKE 1/2 TABLET BY MOUTH DAILY, Disp: 45 tablet, Rfl: 3   metFORMIN (GLUCOPHAGE  XR) 500 MG 24 hr tablet, Take 2 tablets (1,000 mg total) by mouth 2 (two) times daily with a meal., Disp: 360 tablet, Rfl: 1   multivitamin (THERAGRAN) per tablet, Take 1 tablet by mouth daily., Disp: , Rfl:    nystatin cream (MYCOSTATIN), Apply 1 application topically 2 (two) times daily., Disp: 30 g, Rfl: 0   polyvinyl alcohol (LIQUIFILM TEARS) 1.4 % ophthalmic solution, Place 1 drop into both eyes every morning., Disp: , Rfl:    pravastatin (PRAVACHOL) 20 MG tablet, TAKE 1 TABLET BY MOUTH EVERY DAY, Disp: 90 tablet, Rfl: 1  Past Medical History: Past Medical History:  Diagnosis Date   Allergic rhinitis    never tested, fall and spring   Arthritis    hands,knees, feet   Cataracts, bilateral    not surgical yet   CHOLECYSTECTOMY, HX OF 10/08/2007   Qualifier: Diagnosis of  By: Council Mechanic MD, [name]    Chronic diastolic CHF (congestive heart failure) (HCC)    Chronic respiratory failure ([place])    Diabetes mellitus without complication ([place])    Diverticulosis    problems with frequent gas, burping   Glucose intolerance (impaired glucose tolerance)    Gout    History of chicken pox    Hyperlipidemia    Hypertension  NICM (nonischemic cardiomyopathy) ([place]) 2002   EF 25%; improved to normal - echo 4/08: EF 50-55%, mild MR, mild LAE, mild TR;    cath 3/03: normal cors, EF 40%   Obesity    Oxygen deficiency 2017   at night   PONV (postoperative nausea and vomiting)    after wisdom tooth extraction   Restrictive lung disease    Skin cancer    skin cancers only   Sleep apnea    cpap settings 4    Tobacco Use: Social History   Tobacco Use  Smoking Status Never  Smokeless Tobacco Never    Labs: Recent Review Flowsheet Data     Labs for ITP Cardiac and Pulmonary Rehab Latest Ref Rng & Units 11/14/2020 12/26/2020 02/21/2021 04/11/2021 05/17/2021   Cholestrol 0 - 200 mg/dL - - - - -   LDLCALC 0 - 99 mg/dL - - - - -   LDLDIRECT mg/dL 114.0 86.0 79.0 63.0 -   HDL >39.00 mg/dL -  - - - -   Trlycerides 0.0 - 149.0 mg/dL - - - - -   Hemoglobin A1c 4.6 - 6.5 % 6.9(H) - - - 7.2(H)   HCO3 20.0 - 24.0 mEq/L - - - - -   TCO2 0 - 100 mmol/L - - - - -   O2SAT % - - - - -        Pulmonary Assessment Scores:  Pulmonary Assessment Scores     Row Name 05/18/21 1557         ADL UCSD   ADL Phase Entry     SOB Score total 51     Rest 0     Walk 2     Stairs 2     Bath 1     Dress 1     Shop 3       CAT Score   CAT Score 15       mMRC Score   mMRC Score 2              UCSD: Self-administered rating of dyspnea associated with activities of daily living (ADLs) 6-point scale (0 = "not at all" to 5 = "maximal or unable to do because of breathlessness")  Scoring Scores range from 0 to 120.  Minimally important difference is 5 units  CAT: CAT can identify the health impairment of COPD patients and is better correlated with disease progression.  CAT has a scoring range of zero to 40. The CAT score is classified into four groups of low (less than 10), medium (10 - 20), high (21-30) and very high (31-40) based on the impact level of disease on health status. A CAT score over 10 suggests significant symptoms.  A worsening CAT score could be explained by an exacerbation, poor medication adherence, poor inhaler technique, or progression of COPD or comorbid conditions.  CAT MCID is 2 points  mMRC: mMRC (Modified Medical Research Council) Dyspnea Scale is used to assess the degree of baseline functional disability in patients of respiratory disease due to dyspnea. No minimal important difference is established. A decrease in score of 1 point or greater is considered a positive change.   Pulmonary Function Assessment:   Exercise Target Goals: Exercise Program Goal: Individual exercise prescription set using results from initial 6 min walk test and THRR while considering  patients activity barriers and safety.   Exercise Prescription Goal: Initial exercise  prescription builds to 30-45 minutes a day of aerobic activity, 2-3  days per week.  Home exercise guidelines will be given to patient during program as part of exercise prescription that the participant will acknowledge.  Education: Aerobic Exercise: - Group verbal and visual presentation on the components of exercise prescription. Introduces F.I.T.T principle from ACSM for exercise prescriptions.  Reviews F.I.T.T. principles of aerobic exercise including progression. Written material given at graduation.   Education: Resistance Exercise: - Group verbal and visual presentation on the components of exercise prescription. Introduces F.I.T.T principle from ACSM for exercise prescriptions  Reviews F.I.T.T. principles of resistance exercise including progression. Written material given at graduation.    Education: Exercise & Equipment Safety: - Individual verbal instruction and demonstration of equipment use and safety with use of the equipment. Flowsheet Row Pulmonary Rehab from 08/09/2021 in [facility] Cardiac and Pulmonary Rehab  Date 05/18/21  Educator AS  Instruction Review Code 1- Verbalizes Understanding       Education: Exercise Physiology & General Exercise Guidelines: - Group verbal and written instruction with models to review the exercise physiology of the cardiovascular system and associated critical values. Provides general exercise guidelines with specific guidelines to those with heart or lung disease.    Education: Flexibility, Balance, Mind/Body Relaxation: - Group verbal and visual presentation with interactive activity on the components of exercise prescription. Introduces F.I.T.T principle from ACSM for exercise prescriptions. Reviews F.I.T.T. principles of flexibility and balance exercise training including progression. Also discusses the mind body connection.  Reviews various relaxation techniques to help reduce and manage stress (i.e. Deep breathing, progressive muscle  relaxation, and visualization). Balance handout provided to take home. Written material given at graduation.   Activity Barriers & Risk Stratification:  Activity Barriers & Cardiac Risk Stratification - 05/04/21 1448       Activity Barriers & Cardiac Risk Stratification   Activity Barriers Deconditioning;Other (comment);Shortness of Breath    Comments neuropathy in feet             6 Minute Walk:  6 Minute Walk     Row Name 05/18/21 1540         6 Minute Walk   Phase Initial     Distance 1045 feet     Walk Time 6 minutes     # of Rest Breaks 0     MPH 1.98     METS 2.14     RPE 11     Perceived Dyspnea  2     VO2 Peak 7.5     Symptoms No     Resting HR 90 bpm     Resting BP 118/68     Resting Oxygen Saturation  95 %     Exercise Oxygen Saturation  during 6 min walk 89 %     Max Ex. HR 120 bpm     Max Ex. BP 138/84     2 Minute Post BP 126/78       Interval HR   1 Minute HR 107     2 Minute HR 73     3 Minute HR 107     4 Minute HR 117     5 Minute HR 119     6 Minute HR 120     2 Minute Post HR 100     Interval Heart Rate? Yes       Interval Oxygen   Interval Oxygen? Yes     Baseline Oxygen Saturation % 95 %     1 Minute Oxygen Saturation % 98 %  1 Minute Liters of Oxygen 3 L     2 Minute Oxygen Saturation % 95 %     2 Minute Liters of Oxygen 3 L     3 Minute Oxygen Saturation % 91 %     3 Minute Liters of Oxygen 3 L     4 Minute Oxygen Saturation % 92 %     4 Minute Liters of Oxygen 3 L     5 Minute Oxygen Saturation % 89 %     5 Minute Liters of Oxygen 3 L     6 Minute Oxygen Saturation % 89 %     6 Minute Liters of Oxygen 3 L     2 Minute Post Oxygen Saturation % 98 %     2 Minute Post Liters of Oxygen 3 L             Oxygen Initial Assessment:  Oxygen Initial Assessment - 05/04/21 1450       Home Oxygen   Sleep Oxygen Prescription CPAP;Continuous    Liters per minute 3    Home Exercise Oxygen Prescription Continuous    Liters  per minute 3    Home Resting Oxygen Prescription Continuous    Liters per minute 3    Compliance with Home Oxygen Use Yes      Intervention   Short Term Goals To learn and exhibit compliance with exercise, home and travel O2 prescription;To learn and understand importance of monitoring SPO2 with pulse oximeter and demonstrate accurate use of the pulse oximeter.;To learn and understand importance of maintaining oxygen saturations>88%;To learn and demonstrate proper pursed lip breathing techniques or other breathing techniques. ;To learn and demonstrate proper use of respiratory medications    Long  Term Goals Exhibits compliance with exercise, home  and travel O2 prescription;Verbalizes importance of monitoring SPO2 with pulse oximeter and return demonstration;Maintenance of O2 saturations>88%;Exhibits proper breathing techniques, such as pursed lip breathing or other method taught during program session;Compliance with respiratory medication;Demonstrates proper use of MDIs             Oxygen Re-Evaluation:  Oxygen Re-Evaluation     Row Name 05/29/21 0955 06/26/21 1051 07/10/21 1005 07/31/21 1027       Program Oxygen Prescription   Program Oxygen Prescription E-Tanks E-Tanks E-Tanks --    Liters per minute 3 3 3 3     Comments as needed for oxygen saturation below 88% as needed for oxygen saturation below 88% -- as needed for oxygen saturation below 88%      Home Oxygen   Home Oxygen Device -- -- Home Concentrator;Portable Concentrator Home Concentrator;Portable Concentrator    Sleep Oxygen Prescription CPAP;Continuous CPAP;Continuous CPAP;Continuous CPAP;Continuous    Liters per minute 3 3 3 3     Home Exercise Oxygen Prescription Continuous Continuous Continuous Continuous    Liters per minute 3 3 3 3     Home Resting Oxygen Prescription Continuous Continuous None None    Liters per minute 3 3 3 3     Compliance with Home Oxygen Use Yes Yes  Complient with night time use. Uses O2 as  needed during the day, but not at all times. Yes Yes      Goals/Expected Outcomes   Short Term Goals To learn and exhibit compliance with exercise, home and travel O2 prescription;To learn and understand importance of monitoring SPO2 with pulse oximeter and demonstrate accurate use of the pulse oximeter.;To learn and understand importance of maintaining oxygen saturations>88%;To learn and demonstrate proper pursed lip  breathing techniques or other breathing techniques.  To learn and exhibit compliance with exercise, home and travel O2 prescription;To learn and understand importance of monitoring SPO2 with pulse oximeter and demonstrate accurate use of the pulse oximeter.;To learn and understand importance of maintaining oxygen saturations>88%;To learn and demonstrate proper pursed lip breathing techniques or other breathing techniques.  To learn and understand importance of maintaining oxygen saturations>88%;To learn and understand importance of monitoring SPO2 with pulse oximeter and demonstrate accurate use of the pulse oximeter. To learn and understand importance of maintaining oxygen saturations>88%;To learn and understand importance of monitoring SPO2 with pulse oximeter and demonstrate accurate use of the pulse oximeter.    Long  Term Goals Exhibits compliance with exercise, home  and travel O2 prescription;Verbalizes importance of monitoring SPO2 with pulse oximeter and return demonstration;Maintenance of O2 saturations>88%;Exhibits proper breathing techniques, such as pursed lip breathing or other method taught during program session;Compliance with respiratory medication Exhibits compliance with exercise, home  and travel O2 prescription;Verbalizes importance of monitoring SPO2 with pulse oximeter and return demonstration;Maintenance of O2 saturations>88%;Exhibits proper breathing techniques, such as pursed lip breathing or other method taught during program session;Compliance with respiratory  medication Verbalizes importance of monitoring SPO2 with pulse oximeter and return demonstration;Maintenance of O2 saturations>88% Verbalizes importance of monitoring SPO2 with pulse oximeter and return demonstration;Maintenance of O2 saturations>88%    Comments Reviewed PLB technique with pt.  Talked about how it works and it's importance in maintaining their exercise saturations. Patient stated that she does not wear her oxygen at all times during the day, but monitors her SaO2 levels and wears her O2 if her numbers drop below 88%. She reports most of the time that her SaO2 remains in the low 90's. She does consistently wear her oxygen at night while sleeping. She has a pulse oximeter to check her oxygen saturation at home. Informed  and explained why it is important to have one. Reviewed that oxygen saturations should be 88 percent and above. Patient verbalizes understanding. Finola uses oxygen if she is going out, or her husband pushes her in a Database administrator.  She does check oxygen at home and it is usauly 90 or above.    Goals/Expected Outcomes Short: Become more profiecient at using PLB.   Long: Become independent at using PLB. Short: beocme more consistent with home O2 use. Long: become independent in controlling SOB with O2 useage and PLB techniques. Short: monitor oxygen at home with exertion. Long: maintain oxygen saturations above 88 percent independently. Short/Long: continue to monitor at home and keep sats above 88%             Oxygen Discharge (Final Oxygen Re-Evaluation):  Oxygen Re-Evaluation - 07/31/21 1027       Program Oxygen Prescription   Liters per minute 3    Comments as needed for oxygen saturation below 88%      Home Oxygen   Home Oxygen Device Home Concentrator;Portable Concentrator    Sleep Oxygen Prescription CPAP;Continuous    Liters per minute 3    Home Exercise Oxygen Prescription Continuous    Liters per minute 3    Home Resting Oxygen Prescription None     Liters per minute 3    Compliance with Home Oxygen Use Yes      Goals/Expected Outcomes   Short Term Goals To learn and understand importance of maintaining oxygen saturations>88%;To learn and understand importance of monitoring SPO2 with pulse oximeter and demonstrate accurate use of the pulse oximeter.    Long  Term Goals Verbalizes importance of monitoring SPO2 with pulse oximeter and return demonstration;Maintenance of O2 saturations>88%    Comments Arna uses oxygen if she is going out, or her husband pushes her in a Database administrator.  She does check oxygen at home and it is usauly 90 or above.    Goals/Expected Outcomes Short/Long: continue to monitor at home and keep sats above 88%             Initial Exercise Prescription:  Initial Exercise Prescription - 05/18/21 1500       Date of Initial Exercise RX and Referring Provider   Date 05/18/21    Referring Provider [name]      Oxygen   Oxygen Continuous    Liters 3    Maintain Oxygen Saturation 88% or higher      Treadmill   MPH 1.5    Grade 0    Minutes 15    METs 2.15      NuStep   Level 1    SPM 80    Minutes 15    METs 2      REL-XR   Level 1    Speed 50    Minutes 15    METs 2      T5 Nustep   Level 1    SPM 80    Minutes 15    METs 2      Track   Laps 20    Minutes 15    METs 2      Prescription Details   Frequency (times per week) 3    Duration Progress to 30 minutes of continuous aerobic without signs/symptoms of physical distress      Intensity   THRR 40-80% of Max Heartrate 112-135    Ratings of Perceived Exertion 11-13    Perceived Dyspnea 0-4      Resistance Training   Training Prescription Yes    Weight 3 lb    Reps 10-15             Perform Capillary Blood Glucose checks as needed.  Exercise Prescription Changes:   Exercise Prescription Changes     Row Name 05/18/21 1500 06/06/21 1400 06/19/21 1500 07/05/21 1100 07/17/21 1000     Response to Exercise   Blood Pressure  (Admit) 118/68 128/76 122/76 110/70 122/64   Blood Pressure (Exercise) 138/84 132/60 138/62 130/60 122/62   Blood Pressure (Exit) 126/78 124/68 118/62 122/68 122/60   Heart Rate (Admit) 90 bpm 103 bpm 91 bpm 90 bpm 94 bpm   Heart Rate (Exercise) 120 bpm 114 bpm 113 bpm 115 bpm 116 bpm   Heart Rate (Exit) 100 bpm 110 bpm 93 bpm 98 bpm 107 bpm   Oxygen Saturation (Admit) 95 % 96 % 94 % 96 % 96 %   Oxygen Saturation (Exercise) 89 % 94 % 93 % 95 % 94 %   Oxygen Saturation (Exit) 98 % 96 % 97 % 96 % 95 %   Rating of Perceived Exertion (Exercise) 11 11 11 11 11    Perceived Dyspnea (Exercise) 2 2 2 2 1    Symptoms -- -- SOB -- --   Duration -- Progress to 30 minutes of  aerobic without signs/symptoms of physical distress Progress to 30 minutes of  aerobic without signs/symptoms of physical distress Continue with 30 min of aerobic exercise without signs/symptoms of physical distress. Continue with 30 min of aerobic exercise without signs/symptoms of physical distress.   Intensity -- THRR unchanged THRR unchanged  THRR unchanged THRR unchanged     Progression   Progression -- Continue to progress workloads to maintain intensity without signs/symptoms of physical distress. Continue to progress workloads to maintain intensity without signs/symptoms of physical distress. Continue to progress workloads to maintain intensity without signs/symptoms of physical distress. Continue to progress workloads to maintain intensity without signs/symptoms of physical distress.   Average METs -- 2.6 2.2 1.8 2.49     Resistance Training   Training Prescription -- Yes Yes Yes Yes   Weight -- 3 lb 3 lb 3 lb 3 lb   Reps -- 10-15 10-15 10-15 10-15     Interval Training   Interval Training -- -- No No No     Oxygen   Oxygen -- Continuous Continuous Continuous Continuous   Liters -- 3 3 3 3      Treadmill   MPH -- 1.6 1.9 1.9 2.5   Grade -- 0 0 0 0   Minutes -- 15 15 15 15    METs -- 2.23 2.45 2.45 2.91     NuStep    Level -- -- -- -- 3   Minutes -- -- -- -- 15   METs -- -- -- -- 2.4     Arm Ergometer   Level -- -- -- -- 4   Minutes -- -- -- -- 15   METs -- -- -- -- 1.2     REL-XR   Level -- 1 -- 1 1   Minutes -- 15 -- 15 15   METs -- 3 -- 1.2 2     T5 Nustep   Level -- -- 2 -- 2   Minutes -- -- 15 -- 15   METs -- -- 2 -- 2     Oxygen   Maintain Oxygen Saturation -- 88% or higher 88% or higher 88% or higher 88% or higher    Row Name 07/31/21 1300 08/14/21 1300           Response to Exercise   Blood Pressure (Admit) 122/64 122/70      Blood Pressure (Exit) 118/62 102/66      Heart Rate (Admit) 99 bpm 99 bpm      Heart Rate (Exercise) 121 bpm 115 bpm      Heart Rate (Exit) 105 bpm 103 bpm      Oxygen Saturation (Admit) 99 % 99 %      Oxygen Saturation (Exercise) 97 % 96 %      Oxygen Saturation (Exit) 95 % 96 %      Rating of Perceived Exertion (Exercise) 12 11      Perceived Dyspnea (Exercise) 3 2      Symptoms -- SOB      Duration Continue with 30 min of aerobic exercise without signs/symptoms of physical distress. Continue with 30 min of aerobic exercise without signs/symptoms of physical distress.      Intensity THRR unchanged THRR unchanged        Progression   Progression Continue to progress workloads to maintain intensity without signs/symptoms of physical distress. Continue to progress workloads to maintain intensity without signs/symptoms of physical distress.      Average METs 2.35 2.32        Resistance Training   Training Prescription Yes Yes      Weight 3 lb 3 lb      Reps 10-15 10-15        Interval Training   Interval Training No No  Oxygen   Oxygen Continuous Continuous      Liters 3 3        Treadmill   MPH 2.2 2.2      Grade 0 0      Minutes 15 15      METs 2.69 2.68        REL-XR   Level -- 1      Minutes -- 15      METs -- 2.3        T5 Nustep   Level 2 2      Minutes 15 15      METs 2 2        Home Exercise Plan   Plans to  continue exercise at Home (comment) Home (comment)      Frequency Add 2 additional days to program exercise sessions. Add 2 additional days to program exercise sessions.      Initial Home Exercises Provided 07/31/21 07/31/21        Oxygen   Maintain Oxygen Saturation 88% or higher 88% or higher               Exercise Comments:   Exercise Comments     Row Name 05/29/21 0954           Exercise Comments First full day of exercise!  Patient was oriented to gym and equipment including functions, settings, policies, and procedures.  Patient's individual exercise prescription and treatment plan were reviewed.  All starting workloads were established based on the results of the 6 minute walk test done at initial orientation visit.  The plan for exercise progression was also introduced and progression will be customized based on patient's performance and goals.                Exercise Goals and Review:   Exercise Goals     Row Name 05/18/21 1552             Exercise Goals   Increase Physical Activity Yes       Intervention Provide advice, education, support and counseling about physical activity/exercise needs.;Develop an individualized exercise prescription for aerobic and resistive training based on initial evaluation findings, risk stratification, comorbidities and participant's personal goals.       Expected Outcomes Short Term: Attend rehab on a regular basis to increase amount of physical activity.;Long Term: Add in home exercise to make exercise part of routine and to increase amount of physical activity.;Long Term: Exercising regularly at least 3-5 days a week.       Increase Strength and Stamina Yes       Intervention Provide advice, education, support and counseling about physical activity/exercise needs.;Develop an individualized exercise prescription for aerobic and resistive training based on initial evaluation findings, risk stratification, comorbidities and  participant's personal goals.       Expected Outcomes Short Term: Increase workloads from initial exercise prescription for resistance, speed, and METs.;Short Term: Perform resistance training exercises routinely during rehab and add in resistance training at home;Long Term: Improve cardiorespiratory fitness, muscular endurance and strength as measured by increased METs and functional capacity (6MWT)       Able to understand and use rate of perceived exertion (RPE) scale Yes       Intervention Provide education and explanation on how to use RPE scale       Expected Outcomes Short Term: Able to use RPE daily in rehab to express subjective intensity level;Long Term:  Able to use RPE to guide  intensity level when exercising independently       Able to understand and use Dyspnea scale Yes       Intervention Provide education and explanation on how to use Dyspnea scale       Expected Outcomes Short Term: Able to use Dyspnea scale daily in rehab to express subjective sense of shortness of breath during exertion;Long Term: Able to use Dyspnea scale to guide intensity level when exercising independently       Knowledge and understanding of Target Heart Rate Range (THRR) Yes       Intervention Provide education and explanation of THRR including how the numbers were predicted and where they are located for reference       Expected Outcomes Short Term: Able to state/look up THRR;Short Term: Able to use daily as guideline for intensity in rehab;Long Term: Able to use THRR to govern intensity when exercising independently       Able to check pulse independently Yes       Intervention Provide education and demonstration on how to check pulse in carotid and radial arteries.;Review the importance of being able to check your own pulse for safety during independent exercise       Expected Outcomes Short Term: Able to explain why pulse checking is important during independent exercise;Long Term: Able to check pulse  independently and accurately       Understanding of Exercise Prescription Yes       Intervention Provide education, explanation, and written materials on patient's individual exercise prescription       Expected Outcomes Short Term: Able to explain program exercise prescription;Long Term: Able to explain home exercise prescription to exercise independently                Exercise Goals Re-Evaluation :  Exercise Goals Re-Evaluation     Row Name 05/29/21 0954 06/06/21 1422 06/19/21 1514 06/26/21 1034 07/05/21 1200     Exercise Goal Re-Evaluation   Exercise Goals Review Increase Physical Activity;Able to understand and use rate of perceived exertion (RPE) scale;Knowledge and understanding of Target Heart Rate Range (THRR);Understanding of Exercise Prescription;Increase Strength and Stamina;Able to understand and use Dyspnea scale;Able to check pulse independently Increase Physical Activity;Increase Strength and Stamina Increase Physical Activity;Increase Strength and Stamina;Understanding of Exercise Prescription Increase Physical Activity;Increase Strength and Stamina;Understanding of Exercise Prescription --   Comments Reviewed RPE and dyspnea scales, THR and program prescription with pt today.  Pt voiced understanding and was given a copy of goals to take home. Taneil is doing well so far.  She has increased to 1.6 on TM.  Oxygen has stayed int ths 90s on 3 L during exercise. Teryn has only attended once since last review.  She is currently out with COVID.  We will continue to monitor her progress upon return (hopefully next week). Leni has returned to class since having COVID. This was her first day back She tolerated exercise well and felt like she should be able to get back to a good start attending rehab on a regular basis. Marlo has increased TM to 1.9 mph.  She is ready to increase load on XR.  Staff will encourage moving to level 2-3 on XR.   Expected Outcomes Short: Use RPE daily  to regulate intensity. Long: Follow program prescription in THR. Short:  attend consistently Long:improve overall stamina Short: Return to consistent attendance Long: Continue to improve stamina Short: return to consistent rehab attendance. Long: Continue to [employer]. Short: increase level on XR  Long:  increase overall MET level    Row Name 07/17/21 1030 07/31/21 1036 08/14/21 1304         Exercise Goal Re-Evaluation   Exercise Goals Review Increase Physical Activity;Increase Strength and Stamina;Understanding of Exercise Prescription Increase Physical Activity;Increase Strength and Stamina Increase Physical Activity;Increase Strength and Stamina;Understanding of Exercise Prescription     Comments Ebba is doing well in rehab. She has already increased her treadmill speed to 2.5 mph. She should be encouraged to add a small incline to the treadmill. She does hit her [place] most sessions. Will continue to monitor. Reviewed home exercise with pt today.  Pt plans to walk and use stationary bike for exercise.  Reviewed THR, pulse, RPE, sign and symptoms, pulse oximetery and when to call 911 or MD.  Also discussed weather considerations and indoor options.  Pt voiced understanding. Tamla is doing well in rehab.  She is currently at 2.2 mph on the treadmill.  We will encourage her to move up her workloads to continue to make improvements.  We will continue to monitor her progress.     Expected Outcomes Short: Add incline to treadmill Long: Continue to build up overall strength and stamina Short: monitor HR and O2 whe exercising at home Long: become independent with exercise Short: Increase workloads Long: Conitnue to improve stamina              Discharge Exercise Prescription (Final Exercise Prescription Changes):  Exercise Prescription Changes - 08/14/21 1300       Response to Exercise   Blood Pressure (Admit) 122/70    Blood Pressure (Exit) 102/66    Heart Rate (Admit) 99 bpm    Heart  Rate (Exercise) 115 bpm    Heart Rate (Exit) 103 bpm    Oxygen Saturation (Admit) 99 %    Oxygen Saturation (Exercise) 96 %    Oxygen Saturation (Exit) 96 %    Rating of Perceived Exertion (Exercise) 11    Perceived Dyspnea (Exercise) 2    Symptoms SOB    Duration Continue with 30 min of aerobic exercise without signs/symptoms of physical distress.    Intensity THRR unchanged      Progression   Progression Continue to progress workloads to maintain intensity without signs/symptoms of physical distress.    Average METs 2.32      Resistance Training   Training Prescription Yes    Weight 3 lb    Reps 10-15      Interval Training   Interval Training No      Oxygen   Oxygen Continuous    Liters 3      Treadmill   MPH 2.2    Grade 0    Minutes 15    METs 2.68      REL-XR   Level 1    Minutes 15    METs 2.3      T5 Nustep   Level 2    Minutes 15    METs 2      Home Exercise Plan   Plans to continue exercise at Home (comment)    Frequency Add 2 additional days to program exercise sessions.    Initial Home Exercises Provided 07/31/21      Oxygen   Maintain Oxygen Saturation 88% or higher             Nutrition:  Target Goals: Understanding of nutrition guidelines, daily intake of sodium <1560m, cholesterol <2061m calories 30% from fat and 7% or less from saturated  fats, daily to have 5 or more servings of fruits and vegetables.  Education: All About Nutrition: -Group instruction provided by verbal, written material, interactive activities, discussions, models, and posters to present general guidelines for heart healthy nutrition including fat, fiber, MyPlate, the role of sodium in heart healthy nutrition, utilization of the nutrition label, and utilization of this knowledge for meal planning. Follow up email sent as well. Written material given at graduation. Flowsheet Row Pulmonary Rehab from 08/09/2021 in [facility] Cardiac and Pulmonary Rehab  Date 07/12/21   Educator [facility]  Instruction Review Code 1- Verbalizes Understanding       Biometrics:  Pre Biometrics - 05/18/21 1552       Pre Biometrics   Height 5' 5.5" (1.664 m)    Weight 203 lb 3.2 oz (92.2 kg)    BMI (Calculated) 33.29    Single Leg Stand 2.35 seconds              Nutrition Therapy Plan and Nutrition Goals:  Nutrition Therapy & Goals - 07/24/21 1048       Nutrition Therapy   Diet Heart healthy, low Na, pulmonary MNT, T2DM    Protein (specify units) 110g    Fiber 25 grams    Whole Grain Foods 3 servings    Saturated Fats 12 max. grams    Fruits and Vegetables 8 servings/day    Sodium 1.5 grams      Personal Nutrition Goals   Nutrition Goal ST: work on adding things to her snacks (protein, fiber, or fat)  LT: Continue to keep A1C </= 7, meet protein needs, combine fiber/fat/protein    Comments PYP 61. Last A1C 7.2 on metformin. Had a cholecystectomy in 2009 and continues to eat lower amounts of fat. She likes salty and sweet foods and she feels that gets in the way of her eating healthy sometimes. B: protein shake: almond milk, strawberris, banana, protein powder  or oikos yogurt with granola L: 1/2 sandwich with fries or chips D: meat (chicken mostly) and salad or a chicken salad. She uses olive oil, butter, and sometimes bacon in her green beans. Doesn't eat many fried foods. Her weight is stable. She reports that if her husband and her were to go out to the movies they would split popcorn and candy, discussed maybe having some popcorn and then bringing some peanuts - the extra fiber, fat, and protein with help to balance this snack/meal better so it is less carbohydrates and she has a better blood sugar response as well as increasing her protein intake. Discussed heart healthy/T2DM eating as well as pulmonary MNT.      Intervention Plan   Intervention Prescribe, educate and counsel regarding individualized specific dietary modifications aiming towards targeted core  components such as weight, hypertension, lipid management, diabetes, heart failure and other comorbidities.    Expected Outcomes Short Term Goal: Understand basic principles of dietary content, such as calories, fat, sodium, cholesterol and nutrients.;Short Term Goal: A plan has been developed with personal nutrition goals set during dietitian appointment.;Long Term Goal: Adherence to prescribed nutrition plan.             Nutrition Assessments:  MEDIFICTS Score Key: ?70 Need to make dietary changes  40-70 Heart Healthy Diet ? 40 Therapeutic Level Cholesterol Diet  Flowsheet Row Pulmonary Rehab from 05/18/2021 in [facility] Cardiac and Pulmonary Rehab  Picture Your Plate Total Score on Admission 61      Picture Your Plate Scores: <16 Unhealthy dietary pattern with much  room for improvement. 41-50 Dietary pattern unlikely to meet recommendations for good health and room for improvement. 51-60 More healthful dietary pattern, with some room for improvement.  >60 Healthy dietary pattern, although there may be some specific behaviors that could be improved.   Nutrition Goals Re-Evaluation:  Nutrition Goals Re-Evaluation     [place] Name 06/26/21 1045 07/10/21 1003 07/31/21 1023         Goals   Current Weight -- 201 lb (91.2 kg) --     Nutrition Goal -- meet with dietitian --     Comment Patient has not yet met with program dietician. Patient has not yet met with program dietician. Ilyse is stil working on dietary changes.  She says it is more difficult this time of year.     Expected Outcome Short: meed with dieticain. Short: meed with dieticain. Long: adhere to a diet that pertains to patient. Short:  continue to make small changes Long: reach dietary goals as suggested by RD              Nutrition Goals Discharge (Final Nutrition Goals Re-Evaluation):  Nutrition Goals Re-Evaluation - 07/31/21 1023       Goals   Comment Ben is stil working on dietary changes.  She says it is  more difficult this time of year.    Expected Outcome Short:  continue to make small changes Long: reach dietary goals as suggested by RD             Psychosocial: Target Goals: Acknowledge presence or absence of significant depression and/or stress, maximize coping skills, provide positive support system. Participant is able to verbalize types and ability to use techniques and skills needed for reducing stress and depression.   Education: Stress, Anxiety, and Depression - Group verbal and visual presentation to define topics covered.  Reviews how body is impacted by stress, anxiety, and depression.  Also discusses healthy ways to reduce stress and to treat/manage anxiety and depression.  Written material given at graduation.   Education: Sleep Hygiene -Provides group verbal and written instruction about how sleep can affect your health.  Define sleep hygiene, discuss sleep cycles and impact of sleep habits. Review good sleep hygiene tips.    Initial Review & Psychosocial Screening:  Initial Psych Review & Screening - 05/04/21 1453       Initial Review   Current issues with None Identified      Family Dynamics   Good Support System? Yes   husband and children  2 nearby  one in [place]     Barriers   Psychosocial barriers to participate in program There are no identifiable barriers or psychosocial needs.      Screening Interventions   Interventions Encouraged to exercise;To provide support and resources with identified psychosocial needs;Provide feedback about the scores to participant    Expected Outcomes Short Term goal: Utilizing psychosocial counselor, staff and physician to assist with identification of specific Stressors or current issues interfering with healing process. Setting desired goal for each stressor or current issue identified.;Long Term Goal: Stressors or current issues are controlled or eliminated.;Short Term goal: Identification and review with participant of any  Quality of Life or Depression concerns found by scoring the questionnaire.;Long Term goal: The participant improves quality of Life and PHQ9 Scores as seen by post scores and/or verbalization of changes             Quality of Life Scores:  Scores of 19 and below usually indicate a poorer  quality of life in these areas.  A difference of  2-3 points is a clinically meaningful difference.  A difference of 2-3 points in the total score of the Quality of Life Index has been associated with significant improvement in overall quality of life, self-image, physical symptoms, and general health in studies assessing change in quality of life.  PHQ-9: Recent Review Flowsheet Data     Depression screen [facility] 2/9 07/17/2021 07/10/2021 06/26/2021 05/18/2021 04/13/2021   Decreased Interest 0 1 1 1  0   Down, Depressed, Hopeless 0 0 1 1 0   PHQ - 2 Score 0 1 2 2  0   Altered sleeping 1 3 1 1  -   Tired, decreased energy 2 2 2 2  -   Change in appetite 1 3 1 1  -   Feeling bad or failure about yourself  0 0 0 1 -   Trouble concentrating 0 0 1 1 -   Moving slowly or fidgety/restless 0 0 1 0 -   Suicidal thoughts 0 0 0 0 -   PHQ-9 Score 4 9 8 8  -   Difficult doing work/chores Not difficult at all Not difficult at all Not difficult at all Somewhat difficult -      Interpretation of Total Score  Total Score Depression Severity:  1-4 = Minimal depression, 5-9 = Mild depression, 10-14 = Moderate depression, 15-19 = Moderately severe depression, 20-27 = Severe depression   Psychosocial Evaluation and Intervention:  Psychosocial Evaluation - 05/04/21 1505       Psychosocial Evaluation & Interventions   Interventions Encouraged to exercise with the program and follow exercise prescription    Comments Rylann has no barriers to attending the program. She wants to gain back some of her staamina and improve her breathing. She lives with her husband of 10 years. She has him as part of her support, as well as 3  children- 2 nearby and one in [place]. She had been in the program in 2018 and is ready to do what she can to acheive her goals.    Expected Outcomes STG: Megumi is able to attend all scheduled sessions. She is able to see improvment in her breathing and stamina. LTG: Lainee continues with her exercise progress and symptom control    Continue Psychosocial Services  Follow up required by staff             Psychosocial Re-Evaluation:  Psychosocial Re-Evaluation     [place] Name 06/26/21 1046 07/10/21 1001 07/17/21 1033 07/31/21 1025       Psychosocial Re-Evaluation   Current issues with -- -- -- Current Sleep Concerns    Comments Patient reports no new stress or sleep concerns. She did a PHQ reevaluation with a score of 8, but attributes most of her difficulties to her physical condition, not to mental health concerns. Reviewed patient health questionnaire (PHQ-9) with patient for follow up. Previously, patients score indicated signs/symptoms of depression.  Reviewed to see if patient is improving symptom wise while in program.  Score declined and patient states that it is because she is not sleeping well and feels like she has little energy. Reviewed patient health questionnaire (PHQ-9) with patient for follow up. Previously, patients score indicated signs/symptoms of depression.  Reviewed to see if patient is improving symptom wise while in program.  Score declined and patient states that it is because she is not sleeping well and feels like she has little energy. Remie is sleeping a little better now.  She  does usually nap during the day and stay up later at night.    Expected Outcomes Short: continue to attend pulmonary rehab consistently. Long: maintian good metal health habits. Short: Continue to work toward an improvement in [place] scores by attending [facility] regularly. Long: Continue to improve stress and depression coping skills by talking with staff and attending [facility] regularly and  work toward a positive mental state. Short: Continue to work toward an improvement in [place] scores by attending [facility] regularly. Long: Continue to improve stress and depression coping skills by talking with staff and attending [facility] regularly and work toward a positive mental state. Short:   continue to work on sleep patterns Long: feel rested day to day    Interventions Encouraged to attend Pulmonary Rehabilitation for the exercise Encouraged to attend Pulmonary Rehabilitation for the exercise -- --    Continue Psychosocial Services  Follow up required by staff Follow up required by staff -- --             Psychosocial Discharge (Final Psychosocial Re-Evaluation):  Psychosocial Re-Evaluation - 07/31/21 1025       Psychosocial Re-Evaluation   Current issues with Current Sleep Concerns    Comments Aymee is sleeping a little better now.  She does usually nap during the day and stay up later at night.    Expected Outcomes Short:   continue to work on sleep patterns Long: feel rested day to day             Education: Education Goals: Education classes will be provided on a weekly basis, covering required topics. Participant will state understanding/return demonstration of topics presented.  Learning Barriers/Preferences:  Learning Barriers/Preferences - 05/04/21 1456       Learning Barriers/Preferences   Learning Barriers None    Learning Preferences None             General Pulmonary Education Topics:  Infection Prevention: - Provides verbal and written material to individual with discussion of infection control including proper hand washing and proper equipment cleaning during exercise session. Flowsheet Row Pulmonary Rehab from 08/09/2021 in [facility] Cardiac and Pulmonary Rehab  Date 05/18/21  Educator AS  Instruction Review Code 1- Verbalizes Understanding       Falls Prevention: - Provides verbal and written material to individual with discussion of falls  prevention and safety. Flowsheet Row Pulmonary Rehab from 08/09/2021 in [facility] Cardiac and Pulmonary Rehab  Date 05/18/21  Educator AS  Instruction Review Code 1- Verbalizes Understanding       Chronic Lung Disease Review: - Group verbal instruction with posters, models, PowerPoint presentations and videos,  to review new updates, new respiratory medications, new advancements in procedures and treatments. Providing information on websites and "800" numbers for continued self-education. Includes information about supplement oxygen, available portable oxygen systems, continuous and intermittent flow rates, oxygen safety, concentrators, and Medicare reimbursement for oxygen. Explanation of Pulmonary Drugs, including class, frequency, complications, importance of spacers, rinsing mouth after steroid MDI's, and proper cleaning methods for nebulizers. Review of basic lung anatomy and physiology related to function, structure, and complications of lung disease. Review of risk factors. Discussion about methods for diagnosing sleep apnea and types of masks and machines for OSA. Includes a review of the use of types of environmental controls: home humidity, furnaces, filters, dust mite/pet prevention, HEPA vacuums. Discussion about weather changes, air quality and the benefits of nasal washing. Instruction on Warning signs, infection symptoms, calling MD promptly, preventive modes, and value of vaccinations. Review of  effective airway clearance, coughing and/or vibration techniques. Emphasizing that all should Create an Action Plan. Written material given at graduation. Flowsheet Row Pulmonary Rehab from 08/09/2021 in [facility] Cardiac and Pulmonary Rehab  Date 05/31/21  Educator [facility]  Instruction Review Code 1- Verbalizes Understanding       AED/CPR: - Group verbal and written instruction with the use of models to demonstrate the basic use of the AED with the basic ABC's of resuscitation.    Anatomy and  Cardiac Procedures: - Group verbal and visual presentation and models provide information about basic cardiac anatomy and function. Reviews the testing methods done to diagnose heart disease and the outcomes of the test results. Describes the treatment choices: Medical Management, Angioplasty, or Coronary Bypass Surgery for treating various heart conditions including Myocardial Infarction, Angina, Valve Disease, and Cardiac Arrhythmias.  Written material given at graduation.   Medication Safety: - Group verbal and visual instruction to review commonly prescribed medications for heart and lung disease. Reviews the medication, class of the drug, and side effects. Includes the steps to properly store meds and maintain the prescription regimen.  Written material given at graduation.   Other: -Provides group and verbal instruction on various topics (see comments)   Knowledge Questionnaire Score:  Knowledge Questionnaire Score - 05/18/21 1555       Knowledge Questionnaire Score   Pre Score 15/18              Core Components/Risk Factors/Patient Goals at Admission:  Personal Goals and Risk Factors at Admission - 05/18/21 1600       Core Components/Risk Factors/Patient Goals on Admission    Weight Management Yes    Intervention Weight Management: Develop a combined nutrition and exercise program designed to reach desired caloric intake, while maintaining appropriate intake of nutrient and fiber, sodium and fats, and appropriate energy expenditure required for the weight goal.;Weight Management/Obesity: Establish reasonable short term and long term weight goals.    Admit Weight 204 lb (92.5 kg)    Goal Weight: Short Term 200 lb (90.7 kg)    Goal Weight: Long Term 150 lb (68 kg)    Expected Outcomes Short Term: Continue to assess and modify interventions until short term weight is achieved;Long Term: Adherence to nutrition and physical activity/exercise program aimed toward attainment of  established weight goal;Weight Loss: Understanding of general recommendations for a balanced deficit meal plan, which promotes 1-2 lb weight loss per week and includes a negative energy balance of [phone] kcal/d    Improve shortness of breath with ADL's Yes    Intervention Provide education, individualized exercise plan and daily activity instruction to help decrease symptoms of SOB with activities of daily living.    Expected Outcomes Short Term: Improve cardiorespiratory fitness to achieve a reduction of symptoms when performing ADLs;Long Term: Be able to perform more ADLs without symptoms or delay the onset of symptoms    Intervention Provide education, demonstration and support about specific breathing techniuqes utilized for more efficient breathing. Include techniques such as pursed lipped breathing, diaphragmatic breathing and self-pacing activity.    Expected Outcomes Short Term: Participant will be able to demonstrate and use breathing techniques as needed throughout daily activities.    Increase knowledge of respiratory medications and ability to use respiratory devices properly  Yes    Intervention Provide education and demonstration as needed of appropriate use of medications, inhalers, and oxygen therapy.    Expected Outcomes Short Term: Achieves understanding of medications use. Understands that oxygen is a medication  prescribed by physician. Demonstrates appropriate use of inhaler and oxygen therapy.;Long Term: Maintain appropriate use of medications, inhalers, and oxygen therapy.    Diabetes Yes    Intervention Provide education about signs/symptoms and action to take for hypo/hyperglycemia.;Provide education about proper nutrition, including hydration, and aerobic/resistive exercise prescription along with prescribed medications to achieve blood glucose in normal ranges: Fasting glucose 65-99 mg/dL    Expected Outcomes Short Term: Participant verbalizes understanding of the  signs/symptoms and immediate care of hyper/hypoglycemia, proper foot care and importance of medication, aerobic/resistive exercise and nutrition plan for blood glucose control.;Long Term: Attainment of HbA1C < 7%.    Heart Failure Yes    Intervention Provide a combined exercise and nutrition program that is supplemented with education, support and counseling about heart failure. Directed toward relieving symptoms such as shortness of breath, decreased exercise tolerance, and extremity edema.    Expected Outcomes Improve functional capacity of life;Short term: Attendance in program 2-3 days a week with increased exercise capacity. Reported lower sodium intake. Reported increased fruit and vegetable intake. Reports medication compliance.;Short term: Daily weights obtained and reported for increase. Utilizing diuretic protocols set by physician.;Long term: Adoption of self-care skills and reduction of barriers for early signs and symptoms recognition and intervention leading to self-care maintenance.    Hypertension Yes    Intervention Provide education on lifestyle modifcations including regular physical activity/exercise, weight management, moderate sodium restriction and increased consumption of fresh fruit, vegetables, and low fat dairy, alcohol moderation, and smoking cessation.;Monitor prescription use compliance.    Expected Outcomes Short Term: Continued assessment and intervention until BP is < 140/12m HG in hypertensive participants. < 130/824mHG in hypertensive participants with diabetes, heart failure or chronic kidney disease.;Long Term: Maintenance of blood pressure at goal levels.    Lipids Yes    Intervention Provide education and support for participant on nutrition & aerobic/resistive exercise along with prescribed medications to achieve LDL <7044mHDL >17m55m  Expected Outcomes Short Term: Participant states understanding of desired cholesterol values and is compliant with medications  prescribed. Participant is following exercise prescription and nutrition guidelines.;Long Term: Cholesterol controlled with medications as prescribed, with individualized exercise RX and with personalized nutrition plan. Value goals: LDL < 70mg13mL > 40 mg.             Education:Diabetes - Individual verbal and written instruction to review signs/symptoms of diabetes, desired ranges of glucose level fasting, after meals and with exercise. Acknowledge that pre and post exercise glucose checks will be done for 3 sessions at entry of program. Flowsheet Row [facility]  Date 05/18/21  Educator AS  Instruction Review Code 1- Verbalizes Understanding       Know Your Numbers and Heart Failure: - Group verbal and visual instruction to discuss disease risk factors for cardiac and pulmonary disease and treatment options.  Reviews associated critical values for Overweight/Obesity, Hypertension, Cholesterol, and Diabetes.  Discusses basics of heart failure: signs/symptoms and treatments.  Introduces Heart Failure Zone chart for action plan for heart failure.  Written material given at graduation.   Core Components/Risk Factors/Patient Goals Review:   Goals and Risk Factor Review     Row Name 06/26/21 1041 07/10/21 1004 07/31/21 1020         Core Components/Risk Factors/Patient Goals Review   Personal Goals Review Weight Management/Obesity;Develop more efficient breathing techniques such as purse lipped breathing and diaphragmatic breathing and practicing self-pacing with activity.;Heart Failure;Increase knowledge of  respiratory medications and ability to use respiratory devices properly.;Hypertension;Improve shortness of breath with ADL's;Diabetes;Lipids Improve shortness of breath with ADL's Improve shortness of breath with ADL's;Develop more efficient breathing techniques such as purse lipped breathing and diaphragmatic breathing and  practicing self-pacing with activity.     Review Patient reports reports that she is taking all her medications. She stated that her Diabetes medication bothers her stomach, and since having COVID it has made it even worse. She is working on building back to her full dose but at this current time she is long able to tolerate a half dose. She is keeping a close eye on her blood sugar as she works back up to her full dose. Patient does not weigh every day and she was encouraged to do so to keep an eye on weight and fluid levels. Spoke to patient about their shortness of breath and what they can do to improve. Patient has been informed of breathing techniques when starting the program. Patient is informed to tell staff if they have had any med changes and that certain meds they are taking or not taking can be causing shortness of breath. Ercie has not noticed any big changes in her breathing since starting to exercise.  She has had some stiffness from arthritis.  She does practice PLB at home     Expected Outcomes Short: work back up to full dose of DM medications and communicate any concerns with doctor. Weight every day. Long: continue improvements with SOB and continue to manage disease with medical care team. Short: Attend [facility] regularly to improve shortness of breath with ADLs. Long: maintain independence with ADLs Short: continue to exercise to help with breathing Long:  beccome proficient at PLB              Core Components/Risk Factors/Patient Goals at Discharge (Final Review):   Goals and Risk Factor Review - 07/31/21 1020       Core Components/Risk Factors/Patient Goals Review   Personal Goals Review Improve shortness of breath with ADL's;Develop more efficient breathing techniques such as purse lipped breathing and diaphragmatic breathing and practicing self-pacing with activity.    Review Amayah has not noticed any big changes in her breathing since starting to exercise.  She has  had some stiffness from arthritis.  She does practice PLB at home    Expected Outcomes Short: continue to exercise to help with breathing Long:  beccome proficient at PLB             ITP Comments:  ITP Comments     Row Name 05/04/21 1511 05/18/21 1606 05/29/21 0954 05/31/21 1429 06/19/21 1513   ITP Comments Virtual orientation call completed today. shehas an appointment on Date: 05/18/2021  for EP eval and gym Orientation.  Documentation of diagnosis can be found in [facility] Date:   02/07/2021 Completed 6MWT and gym orientation. Initial ITP created and sent for review to Dr. Ottie Glazier, Medical Director. First full day of exercise!  Patient was oriented to gym and equipment including functions, settings, policies, and procedures.  Patient's individual exercise prescription and treatment plan were reviewed.  All starting workloads were established based on the results of the 6 minute walk test done at initial orientation visit.  The plan for exercise progression was also introduced and progression will be customized based on patient's performance and goals. 30 day review completed. ITP sent to Dr. Zetta Bills, Medical Director of  Pulmonary Rehab. Continue with ITP unless changes are made by physician.  Pt is currently out with COVID.  She is due to return on 10/31    Row Name 06/28/21 0815 07/24/21 1153 07/26/21 0751 08/23/21 1121     ITP Comments 30 Day review completed. Medical Director ITP review done, changes made as directed, and signed approval by Medical Director. Completed initial RD consultation 30 Day review completed. Medical Director ITP review done, changes made as directed, and signed approval by Medical Director. 30 Day review completed. Medical Director ITP review done, changes made as directed, and signed approval by Medical Director.             Comments:

## 2021-08-23 NOTE — Telephone Encounter (Signed)
LMOM

## 2021-08-30 ENCOUNTER — Encounter: Payer: Medicare PPO | Attending: Emergency Medicine

## 2021-08-30 ENCOUNTER — Other Ambulatory Visit: Payer: Self-pay

## 2021-08-30 DIAGNOSIS — J984 Other disorders of lung: Secondary | ICD-10-CM | POA: Diagnosis not present

## 2021-08-30 NOTE — Progress Notes (Signed)
Daily Session Note  Patient Details  Name: Betty [name] MRN: [id number] Date of Birth: [birth year] Referring Provider:   April Manson Pulmonary Rehab from 05/18/2021 in [facility]  Referring Provider [name]       Encounter Date: 08/30/2021  Check In:  Session Check In - 08/30/21 0955       Check-In   Supervising physician immediately available to respond to emergencies See telemetry face sheet for immediately available ER MD    Location [facility]    Staff Present [name], MPA, Elveria Rising, BA, ACSM CEP, Exercise Physiologist;Joseph Tessie Fass, [place]    Virtual Visit No    Medication changes reported     No    Fall or balance concerns reported    No    Warm-up and Cool-down Performed on first and last piece of equipment    Resistance Training Performed Yes    VAD Patient? No    PAD/SET Patient? No      Pain Assessment   Currently in Pain? No/denies                Social History   Tobacco Use  Smoking Status Never  Smokeless Tobacco Never    Goals Met:  Independence with exercise equipment Exercise tolerated well No report of concerns or symptoms today Strength training completed today  Goals Unmet:  Not Applicable  Comments: Pt able to follow exercise prescription today without complaint.  Will continue to monitor for progression.    Dr. Emily Filbert is Medical Director for [facility].  Dr. Ottie Glazier is Medical Director for [facility].

## 2021-09-01 ENCOUNTER — Encounter: Payer: Medicare PPO | Admitting: *Deleted

## 2021-09-01 ENCOUNTER — Other Ambulatory Visit: Payer: Self-pay

## 2021-09-01 DIAGNOSIS — J984 Other disorders of lung: Secondary | ICD-10-CM | POA: Diagnosis not present

## 2021-09-01 NOTE — Progress Notes (Signed)
Daily Session Note ° °Patient Details  °Name: Betty [name] °MRN: [id number] °Date of Birth: 02/03/1946 °Referring Provider:   °Flowsheet Row Pulmonary Rehab from 05/18/2021 in [facility]  °Referring Provider [name]  ° °  ° ° °Encounter Date: 09/01/2021 ° °Check In: ° Session Check In - 09/01/21 1000   ° °  ° Check-In  ° Supervising physician immediately available to respond to emergencies See telemetry face sheet for immediately available ER MD   ° Location [facility]   ° Staff Present Meredith Craven, RN BSN;Joseph Hood, RCP,RRT,BSRT;Jessica Hawkins, MA, RCEP, CCRP, CCET   ° Virtual Visit No   ° Medication changes reported     No   ° Fall or balance concerns reported    No   ° Warm-up and Cool-down Performed on first and last piece of equipment   ° Resistance Training Performed Yes   ° VAD Patient? No   ° PAD/SET Patient? No   °  ° Pain Assessment  ° Currently in Pain? No/denies   ° °  °  ° °  ° ° ° ° ° °Social History  ° °Tobacco Use  °Smoking Status Never  °Smokeless Tobacco Never  ° ° °Goals Met:  °Independence with exercise equipment °Exercise tolerated well °No report of concerns or symptoms today °Strength training completed today ° °Goals Unmet:  °Not Applicable ° °Comments: Pt able to follow exercise prescription today without complaint.  Will continue to monitor for progression. ° ° ° °Dr. Mark Miller is Medical Director for [facility].  °Dr. Fuad Aleskerov is Medical Director for [facility]. °

## 2021-09-03 DIAGNOSIS — J9610 Chronic respiratory failure, unspecified whether with hypoxia or hypercapnia: Secondary | ICD-10-CM | POA: Diagnosis not present

## 2021-09-04 ENCOUNTER — Encounter: Payer: Medicare PPO | Admitting: *Deleted

## 2021-09-04 ENCOUNTER — Other Ambulatory Visit: Payer: Self-pay

## 2021-09-04 DIAGNOSIS — J984 Other disorders of lung: Secondary | ICD-10-CM | POA: Diagnosis not present

## 2021-09-04 NOTE — Progress Notes (Signed)
Daily Session Note  Patient Details  Name: KYOMI HECTOR MRN: [id number] Date of Birth: 1945/10/22 Referring Provider:   April Manson Pulmonary Rehab from 05/18/2021 in [facility]  Referring Provider [name]       Encounter Date: 09/04/2021  Check In:  Session Check In - 09/04/21 1027       Check-In   Supervising physician immediately available to respond to emergencies See telemetry face sheet for immediately available ER MD    Location [facility]    Staff Present Renita Papa, RN Moises Blood, BS, ACSM CEP, Exercise Physiologist;Amanda Oletta Darter, [place], ACSM CEP, Exercise Physiologist    Virtual Visit No    Medication changes reported     No    Fall or balance concerns reported    No    Warm-up and Cool-down Performed on first and last piece of equipment    Resistance Training Performed Yes    VAD Patient? No    PAD/SET Patient? No      Pain Assessment   Currently in Pain? No/denies                Social History   Tobacco Use  Smoking Status Never  Smokeless Tobacco Never    Goals Met:  Independence with exercise equipment Exercise tolerated well No report of concerns or symptoms today Strength training completed today  Goals Unmet:  Not Applicable  Comments: Pt able to follow exercise prescription today without complaint.  Will continue to monitor for progression.    Dr. Emily Filbert is Medical Director for [facility].  Dr. Ottie Glazier is Medical Director for [facility].

## 2021-09-20 ENCOUNTER — Encounter: Payer: Self-pay | Admitting: *Deleted

## 2021-09-20 DIAGNOSIS — J984 Other disorders of lung: Secondary | ICD-10-CM

## 2021-09-20 NOTE — Progress Notes (Signed)
Pulmonary Individual Treatment Plan  Patient Details  Name: Betty [name] MRN: [id number] Date of Birth: 1945-09-11 Referring Provider:   April Manson Pulmonary Rehab from 05/18/2021 in [facility] Cardiac and Pulmonary Rehab  Referring Provider [name]       Initial Encounter Date:  Flowsheet Row Pulmonary Rehab from 05/18/2021 in [facility] Cardiac and Pulmonary Rehab  Date 05/18/21       Visit Diagnosis: Restrictive lung disease  Patient's Home Medications on Admission:  Current Outpatient Medications:    acetic acid 2 % otic solution, Insert saturated wick of cotton; keep moist 24 hours by adding 3 to 5 drops every 4 to 6 hours; remove wick after 24 hours and instill 5 drops 3 to 4 times daily, Disp: 15 mL, Rfl: 0   albuterol (VENTOLIN HFA) 108 (90 Base) MCG/ACT inhaler, Inhale 2 puffs into the lungs every 4 (four) hours as needed for wheezing or shortness of breath., Disp: 8 g, Rfl: 6   aspirin 81 MG EC tablet, Take 1 tablet (81 mg total) by mouth daily., Disp: 90 tablet, Rfl: 2   CALCIUM-MAGNESIUM-VITAMIN D PO, Take 1 tablet by mouth daily., Disp: , Rfl:    carvedilol (COREG) 12.5 MG tablet, TAKE 1 TABLET BY MOUTH TWICE A DAY WITH MEALS, Disp: 60 tablet, Rfl: 11   clobetasol (TEMOVATE) 0.05 % GEL, Apply topically 2 (two) times daily., Disp: , Rfl:    Coenzyme Q10 (CO Q 10 PO), Take 200 mg by mouth at bedtime. , Disp: , Rfl:    Colchicine (MITIGARE) 0.6 MG CAPS, Day 1: take 1.2 mg (2 tablets) by mouth at the first sign of flare, followed by 0.6 mg (1 tablet) by mouth after 1 hour. Day 2 and thereafter: take 0.6 mg (1 tablet) by mouth daily until flare has resolved., Disp: 30 capsule, Rfl: 0   Fish Oil OIL, Take 1,200 mg by mouth 2 (two) times daily. GUMMIES, Disp: , Rfl:    fluticasone (FLONASE) 50 MCG/ACT nasal spray, Place 2 sprays into both nostrils daily., Disp: 16 g, Rfl: 6   losartan (COZAAR) 50 MG tablet, TAKE 1/2 TABLET BY MOUTH DAILY, Disp: 45 tablet, Rfl: 3   metFORMIN (GLUCOPHAGE  XR) 500 MG 24 hr tablet, Take 2 tablets (1,000 mg total) by mouth 2 (two) times daily with a meal., Disp: 360 tablet, Rfl: 1   multivitamin (THERAGRAN) per tablet, Take 1 tablet by mouth daily., Disp: , Rfl:    nystatin cream (MYCOSTATIN), Apply 1 application topically 2 (two) times daily., Disp: 30 g, Rfl: 0   polyvinyl alcohol (LIQUIFILM TEARS) 1.4 % ophthalmic solution, Place 1 drop into both eyes every morning., Disp: , Rfl:    pravastatin (PRAVACHOL) 20 MG tablet, TAKE 1 TABLET BY MOUTH EVERY DAY, Disp: 90 tablet, Rfl: 1  Past Medical History: Past Medical History:  Diagnosis Date   Allergic rhinitis    never tested, fall and spring   Arthritis    hands,knees, feet   Cataracts, bilateral    not surgical yet   CHOLECYSTECTOMY, HX OF 10/08/2007   Qualifier: Diagnosis of  By: Council Mechanic MD, [name]    Chronic diastolic CHF (congestive heart failure) (HCC)    Chronic respiratory failure ([place])    Diabetes mellitus without complication ([place])    Diverticulosis    problems with frequent gas, burping   Glucose intolerance (impaired glucose tolerance)    Gout    History of chicken pox    Hyperlipidemia    Hypertension  NICM (nonischemic cardiomyopathy) ([place]) 2002   EF 25%; improved to normal - echo 4/08: EF 50-55%, mild MR, mild LAE, mild TR;    cath 3/03: normal cors, EF 40%   Obesity    Oxygen deficiency 2017   at night   PONV (postoperative nausea and vomiting)    after wisdom tooth extraction   Restrictive lung disease    Skin cancer    skin cancers only   Sleep apnea    cpap settings 4    Tobacco Use: Social History   Tobacco Use  Smoking Status Never  Smokeless Tobacco Never    Labs: Recent Review Flowsheet Data     Labs for ITP Cardiac and Pulmonary Rehab Latest Ref Rng & Units 11/14/2020 12/26/2020 02/21/2021 04/11/2021 05/17/2021   Cholestrol 0 - 200 mg/dL - - - - -   LDLCALC 0 - 99 mg/dL - - - - -   LDLDIRECT mg/dL 114.0 86.0 79.0 63.0 -   HDL >39.00 mg/dL -  - - - -   Trlycerides 0.0 - 149.0 mg/dL - - - - -   Hemoglobin A1c 4.6 - 6.5 % 6.9(H) - - - 7.2(H)   HCO3 20.0 - 24.0 mEq/L - - - - -   TCO2 0 - 100 mmol/L - - - - -   O2SAT % - - - - -        Pulmonary Assessment Scores:  Pulmonary Assessment Scores     Row Name 05/18/21 1557         ADL UCSD   ADL Phase Entry     SOB Score total 51     Rest 0     Walk 2     Stairs 2     Bath 1     Dress 1     Shop 3       CAT Score   CAT Score 15       mMRC Score   mMRC Score 2              UCSD: Self-administered rating of dyspnea associated with activities of daily living (ADLs) 6-point scale (0 = "not at all" to 5 = "maximal or unable to do because of breathlessness")  Scoring Scores range from 0 to 120.  Minimally important difference is 5 units  CAT: CAT can identify the health impairment of COPD patients and is better correlated with disease progression.  CAT has a scoring range of zero to 40. The CAT score is classified into four groups of low (less than 10), medium (10 - 20), high (21-30) and very high (31-40) based on the impact level of disease on health status. A CAT score over 10 suggests significant symptoms.  A worsening CAT score could be explained by an exacerbation, poor medication adherence, poor inhaler technique, or progression of COPD or comorbid conditions.  CAT MCID is 2 points  mMRC: mMRC (Modified Medical Research Council) Dyspnea Scale is used to assess the degree of baseline functional disability in patients of respiratory disease due to dyspnea. No minimal important difference is established. A decrease in score of 1 point or greater is considered a positive change.   Pulmonary Function Assessment:   Exercise Target Goals: Exercise Program Goal: Individual exercise prescription set using results from initial 6 min walk test and THRR while considering  patients activity barriers and safety.   Exercise Prescription Goal: Initial exercise  prescription builds to 30-45 minutes a day of aerobic activity, 2-3  days per week.  Home exercise guidelines will be given to patient during program as part of exercise prescription that the participant will acknowledge.  Education: Aerobic Exercise: - Group verbal and visual presentation on the components of exercise prescription. Introduces F.I.T.T principle from ACSM for exercise prescriptions.  Reviews F.I.T.T. principles of aerobic exercise including progression. Written material given at graduation.   Education: Resistance Exercise: - Group verbal and visual presentation on the components of exercise prescription. Introduces F.I.T.T principle from ACSM for exercise prescriptions  Reviews F.I.T.T. principles of resistance exercise including progression. Written material given at graduation. Flowsheet Row Pulmonary Rehab from 08/30/2021 in [facility] Cardiac and Pulmonary Rehab  Date 08/30/21  Educator [facility]  Instruction Review Code 1- Verbalizes Understanding        Education: Exercise & Equipment Safety: - Individual verbal instruction and demonstration of equipment use and safety with use of the equipment. Flowsheet Row Pulmonary Rehab from 08/30/2021 in [facility] Pulmonary Rehab  Date 05/18/21  Educator AS  Instruction Review Code 1- Verbalizes Understanding       Education: Exercise Physiology & General Exercise Guidelines: - Group verbal and written instruction with models to review the exercise physiology of the cardiovascular system and associated critical values. Provides general exercise guidelines with specific guidelines to those with heart or lung disease.    Education: Flexibility, Balance, Mind/Body Relaxation: - Group verbal and visual presentation with interactive activity on the components of exercise prescription. Introduces F.I.T.T principle from ACSM for exercise prescriptions. Reviews F.I.T.T. principles of flexibility and balance exercise training including  progression. Also discusses the mind body connection.  Reviews various relaxation techniques to help reduce and manage stress (i.e. Deep breathing, progressive muscle relaxation, and visualization). Balance handout provided to take home. Written material given at graduation.   Activity Barriers & Risk Stratification:  Activity Barriers & Cardiac Risk Stratification - 05/04/21 1448       Activity Barriers & Cardiac Risk Stratification   Activity Barriers Deconditioning;Other (comment);Shortness of Breath    Comments neuropathy in feet             6 Minute Walk:  6 Minute Walk     Row Name 05/18/21 1540         6 Minute Walk   Phase Initial     Distance 1045 feet     Walk Time 6 minutes     # of Rest Breaks 0     MPH 1.98     METS 2.14     RPE 11     Perceived Dyspnea  2     VO2 Peak 7.5     Symptoms No     Resting HR 90 bpm     Resting BP 118/68     Resting Oxygen Saturation  95 %     Exercise Oxygen Saturation  during 6 min walk 89 %     Max Ex. HR 120 bpm     Max Ex. BP 138/84     2 Minute Post BP 126/78       Interval HR   1 Minute HR 107     2 Minute HR 73     3 Minute HR 107     4 Minute HR 117     5 Minute HR 119     6 Minute HR 120     2 Minute Post HR 100     Interval Heart Rate? Yes       Interval Oxygen  Interval Oxygen? Yes     Baseline Oxygen Saturation % 95 %     1 Minute Oxygen Saturation % 98 %     1 Minute Liters of Oxygen 3 L     2 Minute Oxygen Saturation % 95 %     2 Minute Liters of Oxygen 3 L     3 Minute Oxygen Saturation % 91 %     3 Minute Liters of Oxygen 3 L     4 Minute Oxygen Saturation % 92 %     4 Minute Liters of Oxygen 3 L     5 Minute Oxygen Saturation % 89 %     5 Minute Liters of Oxygen 3 L     6 Minute Oxygen Saturation % 89 %     6 Minute Liters of Oxygen 3 L     2 Minute Post Oxygen Saturation % 98 %     2 Minute Post Liters of Oxygen 3 L             Oxygen Initial Assessment:  Oxygen Initial Assessment  - 05/04/21 1450       Home Oxygen   Sleep Oxygen Prescription CPAP;Continuous    Liters per minute 3    Home Exercise Oxygen Prescription Continuous    Liters per minute 3    Home Resting Oxygen Prescription Continuous    Liters per minute 3    Compliance with Home Oxygen Use Yes      Intervention   Short Term Goals To learn and exhibit compliance with exercise, home and travel O2 prescription;To learn and understand importance of monitoring SPO2 with pulse oximeter and demonstrate accurate use of the pulse oximeter.;To learn and understand importance of maintaining oxygen saturations>88%;To learn and demonstrate proper pursed lip breathing techniques or other breathing techniques. ;To learn and demonstrate proper use of respiratory medications    Long  Term Goals Exhibits compliance with exercise, home  and travel O2 prescription;Verbalizes importance of monitoring SPO2 with pulse oximeter and return demonstration;Maintenance of O2 saturations>88%;Exhibits proper breathing techniques, such as pursed lip breathing or other method taught during program session;Compliance with respiratory medication;Demonstrates proper use of MDIs             Oxygen Re-Evaluation:  Oxygen Re-Evaluation     [place] Name 05/29/21 0955 06/26/21 1051 07/10/21 1005 07/31/21 1027 09/04/21 1019     Program Oxygen Prescription   Program Oxygen Prescription E-Tanks E-Tanks E-Tanks -- E-Tanks   Liters per minute 3 3 3 3 3    Comments as needed for oxygen saturation below 88% as needed for oxygen saturation below 88% -- as needed for oxygen saturation below 88% as needed for oxygen saturation below 88%     Home Oxygen   Home Oxygen Device -- -- Home Concentrator;Portable Concentrator Home Concentrator;Portable Concentrator Home Concentrator;Portable Concentrator   Sleep Oxygen Prescription CPAP;Continuous CPAP;Continuous CPAP;Continuous CPAP;Continuous CPAP;Continuous   Liters per minute 3 3 3 3 3    Home Exercise  Oxygen Prescription Continuous Continuous Continuous Continuous Continuous   Liters per minute 3 3 3 3 3    Home Resting Oxygen Prescription Continuous Continuous None None None   Liters per minute 3 3 3 3  --   Compliance with Home Oxygen Use Yes Yes  Complient with night time use. Uses O2 as needed during the day, but not at all times. Yes Yes Yes     Goals/Expected Outcomes   Short Term Goals To learn and exhibit compliance with exercise, home  and travel O2 prescription;To learn and understand importance of monitoring SPO2 with pulse oximeter and demonstrate accurate use of the pulse oximeter.;To learn and understand importance of maintaining oxygen saturations>88%;To learn and demonstrate proper pursed lip breathing techniques or other breathing techniques.  To learn and exhibit compliance with exercise, home and travel O2 prescription;To learn and understand importance of monitoring SPO2 with pulse oximeter and demonstrate accurate use of the pulse oximeter.;To learn and understand importance of maintaining oxygen saturations>88%;To learn and demonstrate proper pursed lip breathing techniques or other breathing techniques.  To learn and understand importance of maintaining oxygen saturations>88%;To learn and understand importance of monitoring SPO2 with pulse oximeter and demonstrate accurate use of the pulse oximeter. To learn and understand importance of maintaining oxygen saturations>88%;To learn and understand importance of monitoring SPO2 with pulse oximeter and demonstrate accurate use of the pulse oximeter. To learn and understand importance of maintaining oxygen saturations>88%;To learn and understand importance of monitoring SPO2 with pulse oximeter and demonstrate accurate use of the pulse oximeter.;To learn and exhibit compliance with exercise, home and travel O2 prescription;To learn and demonstrate proper pursed lip breathing techniques or other breathing techniques. ;To learn and demonstrate  proper use of respiratory medications   Long  Term Goals Exhibits compliance with exercise, home  and travel O2 prescription;Verbalizes importance of monitoring SPO2 with pulse oximeter and return demonstration;Maintenance of O2 saturations>88%;Exhibits proper breathing techniques, such as pursed lip breathing or other method taught during program session;Compliance with respiratory medication Exhibits compliance with exercise, home  and travel O2 prescription;Verbalizes importance of monitoring SPO2 with pulse oximeter and return demonstration;Maintenance of O2 saturations>88%;Exhibits proper breathing techniques, such as pursed lip breathing or other method taught during program session;Compliance with respiratory medication Verbalizes importance of monitoring SPO2 with pulse oximeter and return demonstration;Maintenance of O2 saturations>88% Verbalizes importance of monitoring SPO2 with pulse oximeter and return demonstration;Maintenance of O2 saturations>88% Verbalizes importance of monitoring SPO2 with pulse oximeter and return demonstration;Maintenance of O2 saturations>88%;Exhibits compliance with exercise, home  and travel O2 prescription;Exhibits proper breathing techniques, such as pursed lip breathing or other method taught during program session;Compliance with respiratory medication;Demonstrates proper use of MDIs   Comments Reviewed PLB technique with pt.  Talked about how it works and it's importance in maintaining their exercise saturations. Patient stated that she does not wear her oxygen at all times during the day, but monitors her SaO2 levels and wears her O2 if her numbers drop below 88%. She reports most of the time that her SaO2 remains in the low 90's. She does consistently wear her oxygen at night while sleeping. She has a pulse oximeter to check her oxygen saturation at home. Informed  and explained why it is important to have one. Reviewed that oxygen saturations should be 88 percent  and above. Patient verbalizes understanding. Romi uses oxygen if she is going out, or her husband pushes her in a Database administrator.  She does check oxygen at home and it is usauly 90 or above. Tamaiya is doing well with her oxygen compliance.  She uses her CPAP nightly.  She has found now that she does not need her oxygen as much at home, but will use it when she goes out and about.  Her saturations are staying up and she will put on her oxygen if she feels like it is getting low.   Goals/Expected Outcomes Short: Become more profiecient at using PLB.   Long: Become independent at using PLB. Short: beocme more consistent with home O2 use.  Long: become independent in controlling SOB with O2 useage and PLB techniques. Short: monitor oxygen at home with exertion. Long: maintain oxygen saturations above 88 percent independently. Short/Long: continue to monitor at home and keep sats above 88% Short: Continue to use PLB to help with breathing Long: Conitnue to improve SOB            Oxygen Discharge (Final Oxygen Re-Evaluation):  Oxygen Re-Evaluation - 09/04/21 1019       Program Oxygen Prescription   Program Oxygen Prescription E-Tanks    Liters per minute 3    Comments as needed for oxygen saturation below 88%      Home Oxygen   Home Oxygen Device Home Concentrator;Portable Concentrator    Sleep Oxygen Prescription CPAP;Continuous    Liters per minute 3    Home Exercise Oxygen Prescription Continuous    Liters per minute 3    Home Resting Oxygen Prescription None    Compliance with Home Oxygen Use Yes      Goals/Expected Outcomes   Short Term Goals To learn and understand importance of maintaining oxygen saturations>88%;To learn and understand importance of monitoring SPO2 with pulse oximeter and demonstrate accurate use of the pulse oximeter.;To learn and exhibit compliance with exercise, home and travel O2 prescription;To learn and demonstrate proper pursed lip breathing techniques or other  breathing techniques. ;To learn and demonstrate proper use of respiratory medications    Long  Term Goals Verbalizes importance of monitoring SPO2 with pulse oximeter and return demonstration;Maintenance of O2 saturations>88%;Exhibits compliance with exercise, home  and travel O2 prescription;Exhibits proper breathing techniques, such as pursed lip breathing or other method taught during program session;Compliance with respiratory medication;Demonstrates proper use of MDIs    Comments Dorethia is doing well with her oxygen compliance.  She uses her CPAP nightly.  She has found now that she does not need her oxygen as much at home, but will use it when she goes out and about.  Her saturations are staying up and she will put on her oxygen if she feels like it is getting low.    Goals/Expected Outcomes Short: Continue to use PLB to help with breathing Long: Conitnue to improve SOB             Initial Exercise Prescription:  Initial Exercise Prescription - 05/18/21 1500       Date of Initial Exercise RX and Referring Provider   Date 05/18/21    Referring Provider [name]      Oxygen   Oxygen Continuous    Liters 3    Maintain Oxygen Saturation 88% or higher      Treadmill   MPH 1.5    Grade 0    Minutes 15    METs 2.15      NuStep   Level 1    SPM 80    Minutes 15    METs 2      REL-XR   Level 1    Speed 50    Minutes 15    METs 2      T5 Nustep   Level 1    SPM 80    Minutes 15    METs 2      Track   Laps 20    Minutes 15    METs 2      Prescription Details   Frequency (times per week) 3    Duration Progress to 30 minutes of continuous aerobic without signs/symptoms of physical distress  Intensity   THRR 40-80% of Max Heartrate 112-135    Ratings of Perceived Exertion 11-13    Perceived Dyspnea 0-4      Resistance Training   Training Prescription Yes    Weight 3 lb    Reps 10-15             Perform Capillary Blood Glucose checks as  needed.  Exercise Prescription Changes:   Exercise Prescription Changes     Row Name 05/18/21 1500 06/06/21 1400 06/19/21 1500 07/05/21 1100 07/17/21 1000     Response to Exercise   Blood Pressure (Admit) 118/68 128/76 122/76 110/70 122/64   Blood Pressure (Exercise) 138/84 132/60 138/62 130/60 122/62   Blood Pressure (Exit) 126/78 124/68 118/62 122/68 122/60   Heart Rate (Admit) 90 bpm 103 bpm 91 bpm 90 bpm 94 bpm   Heart Rate (Exercise) 120 bpm 114 bpm 113 bpm 115 bpm 116 bpm   Heart Rate (Exit) 100 bpm 110 bpm 93 bpm 98 bpm 107 bpm   Oxygen Saturation (Admit) 95 % 96 % 94 % 96 % 96 %   Oxygen Saturation (Exercise) 89 % 94 % 93 % 95 % 94 %   Oxygen Saturation (Exit) 98 % 96 % 97 % 96 % 95 %   Rating of Perceived Exertion (Exercise) 11 11 11 11 11    Perceived Dyspnea (Exercise) 2 2 2 2 1    Symptoms -- -- SOB -- --   Duration -- Progress to 30 minutes of  aerobic without signs/symptoms of physical distress Progress to 30 minutes of  aerobic without signs/symptoms of physical distress Continue with 30 min of aerobic exercise without signs/symptoms of physical distress. Continue with 30 min of aerobic exercise without signs/symptoms of physical distress.   Intensity -- THRR unchanged THRR unchanged THRR unchanged THRR unchanged     Progression   Progression -- Continue to progress workloads to maintain intensity without signs/symptoms of physical distress. Continue to progress workloads to maintain intensity without signs/symptoms of physical distress. Continue to progress workloads to maintain intensity without signs/symptoms of physical distress. Continue to progress workloads to maintain intensity without signs/symptoms of physical distress.   Average METs -- 2.6 2.2 1.8 2.49     Resistance Training   Training Prescription -- Yes Yes Yes Yes   Weight -- 3 lb 3 lb 3 lb 3 lb   Reps -- 10-15 10-15 10-15 10-15     Interval Training   Interval Training -- -- No No No     Oxygen    Oxygen -- Continuous Continuous Continuous Continuous   Liters -- 3 3 3 3      Treadmill   MPH -- 1.6 1.9 1.9 2.5   Grade -- 0 0 0 0   Minutes -- 15 15 15 15    METs -- 2.23 2.45 2.45 2.91     NuStep   Level -- -- -- -- 3   Minutes -- -- -- -- 15   METs -- -- -- -- 2.4     Arm Ergometer   Level -- -- -- -- 4   Minutes -- -- -- -- 15   METs -- -- -- -- 1.2     REL-XR   Level -- 1 -- 1 1   Minutes -- 15 -- 15 15   METs -- 3 -- 1.2 2     T5 Nustep   Level -- -- 2 -- 2   Minutes -- -- 15 -- 15   METs -- --  2 -- 2     Oxygen   Maintain Oxygen Saturation -- 88% or higher 88% or higher 88% or higher 88% or higher    Row Name 07/31/21 1300 08/14/21 1300 08/30/21 1200 09/11/21 1000       Response to Exercise   Blood Pressure (Admit) 122/64 122/70 118/64 124/70    Blood Pressure (Exit) 118/62 102/66 112/66 104/62    Heart Rate (Admit) 99 bpm 99 bpm 84 bpm 89 bpm    Heart Rate (Exercise) 121 bpm 115 bpm 103 bpm 118 bpm    Heart Rate (Exit) 105 bpm 103 bpm 90 bpm 103 bpm    Oxygen Saturation (Admit) 99 % 99 % 97 % 100 %    Oxygen Saturation (Exercise) 97 % 96 % 98 % 98 %    Oxygen Saturation (Exit) 95 % 96 % 97 % 98 %    Rating of Perceived Exertion (Exercise) 12 11 11 13     Perceived Dyspnea (Exercise) 3 2 1 2     Symptoms -- SOB -- SOB    Duration Continue with 30 min of aerobic exercise without signs/symptoms of physical distress. Continue with 30 min of aerobic exercise without signs/symptoms of physical distress. Continue with 30 min of aerobic exercise without signs/symptoms of physical distress. Continue with 30 min of aerobic exercise without signs/symptoms of physical distress.    Intensity THRR unchanged THRR unchanged THRR unchanged THRR unchanged      Progression   Progression Continue to progress workloads to maintain intensity without signs/symptoms of physical distress. Continue to progress workloads to maintain intensity without signs/symptoms of physical distress.  Continue to progress workloads to maintain intensity without signs/symptoms of physical distress. Continue to progress workloads to maintain intensity without signs/symptoms of physical distress.    Average METs 2.35 2.32 2.35 2.34      Resistance Training   Training Prescription Yes Yes Yes Yes    Weight 3 lb 3 lb 3 lb 3 lb    Reps 10-15 10-15 10-15 10-15      Interval Training   Interval Training No No No No      Oxygen   Oxygen Continuous Continuous Continuous Continuous    Liters 3 3 3 3       Treadmill   MPH 2.2 2.2 2.2 2.2    Grade 0 0 0 0    Minutes 15 15 15 15     METs 2.69 2.68 2.68 2.69      REL-XR   Level -- 1 2 2     Minutes -- 15 15 15     METs -- 2.3 2 2       T5 Nustep   Level 2 2 -- 2    Minutes 15 15 -- 15    METs 2 2 -- 2      Home Exercise Plan   Plans to continue exercise at Home (comment) Home (comment) Home (comment) Home (comment)    Frequency Add 2 additional days to program exercise sessions. Add 2 additional days to program exercise sessions. Add 2 additional days to program exercise sessions. Add 2 additional days to program exercise sessions.    Initial Home Exercises Provided 07/31/21 07/31/21 07/31/21 07/31/21      Oxygen   Maintain Oxygen Saturation 88% or higher 88% or higher 88% or higher 88% or higher             Exercise Comments:   Exercise Comments     Row Name 05/29/21 [phone]  Exercise Comments First full day of exercise!  Patient was oriented to gym and equipment including functions, settings, policies, and procedures.  Patient's individual exercise prescription and treatment plan were reviewed.  All starting workloads were established based on the results of the 6 minute walk test done at initial orientation visit.  The plan for exercise progression was also introduced and progression will be customized based on patient's performance and goals.                Exercise Goals and Review:   Exercise Goals     Row  Name 05/18/21 1552             Exercise Goals   Increase Physical Activity Yes       Intervention Provide advice, education, support and counseling about physical activity/exercise needs.;Develop an individualized exercise prescription for aerobic and resistive training based on initial evaluation findings, risk stratification, comorbidities and participant's personal goals.       Expected Outcomes Short Term: Attend rehab on a regular basis to increase amount of physical activity.;Long Term: Add in home exercise to make exercise part of routine and to increase amount of physical activity.;Long Term: Exercising regularly at least 3-5 days a week.       Increase Strength and Stamina Yes       Intervention Provide advice, education, support and counseling about physical activity/exercise needs.;Develop an individualized exercise prescription for aerobic and resistive training based on initial evaluation findings, risk stratification, comorbidities and participant's personal goals.       Expected Outcomes Short Term: Increase workloads from initial exercise prescription for resistance, speed, and METs.;Short Term: Perform resistance training exercises routinely during rehab and add in resistance training at home;Long Term: Improve cardiorespiratory fitness, muscular endurance and strength as measured by increased METs and functional capacity (6MWT)       Able to understand and use rate of perceived exertion (RPE) scale Yes       Intervention Provide education and explanation on how to use RPE scale       Expected Outcomes Short Term: Able to use RPE daily in rehab to express subjective intensity level;Long Term:  Able to use RPE to guide intensity level when exercising independently       Able to understand and use Dyspnea scale Yes       Intervention Provide education and explanation on how to use Dyspnea scale       Expected Outcomes Short Term: Able to use Dyspnea scale daily in rehab to express  subjective sense of shortness of breath during exertion;Long Term: Able to use Dyspnea scale to guide intensity level when exercising independently       Knowledge and understanding of Target Heart Rate Range (THRR) Yes       Intervention Provide education and explanation of THRR including how the numbers were predicted and where they are located for reference       Expected Outcomes Short Term: Able to state/look up THRR;Short Term: Able to use daily as guideline for intensity in rehab;Long Term: Able to use THRR to govern intensity when exercising independently       Able to check pulse independently Yes       Intervention Provide education and demonstration on how to check pulse in carotid and radial arteries.;Review the importance of being able to check your own pulse for safety during independent exercise       Expected Outcomes Short Term: Able to explain why pulse checking is  important during independent exercise;Long Term: Able to check pulse independently and accurately       Understanding of Exercise Prescription Yes       Intervention Provide education, explanation, and written materials on patient's individual exercise prescription       Expected Outcomes Short Term: Able to explain program exercise prescription;Long Term: Able to explain home exercise prescription to exercise independently                Exercise Goals Re-Evaluation :  Exercise Goals Re-Evaluation     Row Name 05/29/21 0954 06/06/21 1422 06/19/21 1514 06/26/21 1034 07/05/21 1200     Exercise Goal Re-Evaluation   Exercise Goals Review Increase Physical Activity;Able to understand and use rate of perceived exertion (RPE) scale;Knowledge and understanding of Target Heart Rate Range (THRR);Understanding of Exercise Prescription;Increase Strength and Stamina;Able to understand and use Dyspnea scale;Able to check pulse independently Increase Physical Activity;Increase Strength and Stamina Increase Physical  Activity;Increase Strength and Stamina;Understanding of Exercise Prescription Increase Physical Activity;Increase Strength and Stamina;Understanding of Exercise Prescription --   Comments Reviewed RPE and dyspnea scales, THR and program prescription with pt today.  Pt voiced understanding and was given a copy of goals to take home. Taniah is doing well so far.  She has increased to 1.6 on TM.  Oxygen has stayed int ths 90s on 3 L during exercise. Atley has only attended once since last review.  She is currently out with COVID.  We will continue to monitor her progress upon return (hopefully next week). Nimo has returned to class since having COVID. This was her first day back She tolerated exercise well and felt like she should be able to get back to a good start attending rehab on a regular basis. [place] has increased TM to 1.9 mph.  She is ready to increase load on XR.  Staff will encourage moving to level 2-3 on XR.   Expected Outcomes Short: Use RPE daily to regulate intensity. Long: Follow program prescription in THR. Short:  attend consistently Long:improve overall stamina Short: Return to consistent attendance Long: Continue to improve stamina Short: return to consistent rehab attendance. Long: Continue to [employer]. Short: increase level on XR Long:  increase overall MET level    Row Name 07/17/21 1030 07/31/21 1036 08/14/21 1304 08/30/21 1234 09/04/21 1010     Exercise Goal Re-Evaluation   Exercise Goals Review Increase Physical Activity;Increase Strength and Stamina;Understanding of Exercise Prescription Increase Physical Activity;Increase Strength and Stamina Increase Physical Activity;Increase Strength and Stamina;Understanding of Exercise Prescription Increase Strength and Stamina;Increase Physical Activity Increase Strength and Stamina;Increase Physical Activity;Understanding of Exercise Prescription   Comments Jacyln is doing well in rehab. She has already increased her treadmill  speed to 2.5 mph. She should be encouraged to add a small incline to the treadmill. She does hit her [place] most sessions. Will continue to monitor. Reviewed home exercise with pt today.  Pt plans to walk and use stationary bike for exercise.  Reviewed THR, pulse, RPE, sign and symptoms, pulse oximetery and when to call 911 or MD.  Also discussed weather considerations and indoor options.  Pt voiced understanding. Trixie is doing well in rehab.  She is currently at 2.2 mph on the treadmill.  We will encourage her to move up her workloads to continue to make improvements.  We will continue to monitor her progress. Concepcion has missed several sessions due to the holidays but is ready to get back.  She was able to work at  previous levels this session. We will continue to monitor progress. Clemencia is doing well in rehab. She is not doing much for exercise at home on her off days.  However, she does her best to stay active and keep moving up until 6pm and then she is tired for the day. She is starting to get back her strength and stamina.   Expected Outcomes Short: Add incline to treadmill Long: Continue to build up overall strength and stamina Short: monitor HR and O2 whe exercising at home Long: become independent with exercise Short: Increase workloads Long: Conitnue to improve stamina Short: get back to consistent attendance Long:  build overall stamina Short: try to add in exercise at home Long: continue to improve stamina.    [place] Name 09/11/21 1011             Exercise Goal Re-Evaluation   Exercise Goals Review Increase Physical Activity;Increase Strength and Stamina       Comments Kylinn's attendance has been sporadic and she will be out the next couple of weeks due to doctor appointments.  She has been working at the same level on the T5 and XR as well as the same speed with no incline on the treadmill. She would at least benefit from adding incline to the treadmill in addition to increase speed as  tolerated. Will continue to monitor       Expected Outcomes Short: Add incline to the treadmill Long: Increase overall MET level                Discharge Exercise Prescription (Final Exercise Prescription Changes):  Exercise Prescription Changes - 09/11/21 1000       Response to Exercise   Blood Pressure (Admit) 124/70    Blood Pressure (Exit) 104/62    Heart Rate (Admit) 89 bpm    Heart Rate (Exercise) 118 bpm    Heart Rate (Exit) 103 bpm    Oxygen Saturation (Admit) 100 %    Oxygen Saturation (Exercise) 98 %    Oxygen Saturation (Exit) 98 %    Rating of Perceived Exertion (Exercise) 13    Perceived Dyspnea (Exercise) 2    Symptoms SOB    Duration Continue with 30 min of aerobic exercise without signs/symptoms of physical distress.    Intensity THRR unchanged      Progression   Progression Continue to progress workloads to maintain intensity without signs/symptoms of physical distress.    Average METs 2.34      Resistance Training   Training Prescription Yes    Weight 3 lb    Reps 10-15      Interval Training   Interval Training No      Oxygen   Oxygen Continuous    Liters 3      Treadmill   MPH 2.2    Grade 0    Minutes 15    METs 2.69      REL-XR   Level 2    Minutes 15    METs 2      T5 Nustep   Level 2    Minutes 15    METs 2      Home Exercise Plan   Plans to continue exercise at Home (comment)    Frequency Add 2 additional days to program exercise sessions.    Initial Home Exercises Provided 07/31/21      Oxygen   Maintain Oxygen Saturation 88% or higher  Nutrition:  Target Goals: Understanding of nutrition guidelines, daily intake of sodium <1551m, cholesterol <202m calories 30% from fat and 7% or less from saturated fats, daily to have 5 or more servings of fruits and vegetables.  Education: All About Nutrition: -Group instruction provided by verbal, written material, interactive activities, discussions, models, and  posters to present general guidelines for heart healthy nutrition including fat, fiber, MyPlate, the role of sodium in heart healthy nutrition, utilization of the nutrition label, and utilization of this knowledge for meal planning. Follow up email sent as well. Written material given at graduation. [facility]  Date 07/12/21  [facility] Code 1- Verbalizes Understanding       Biometrics:  Pre Biometrics - 05/18/21 1552       Pre Biometrics   Height 5' 5.5" (1.664 m)    Weight 203 lb 3.2 oz (92.2 kg)    BMI (Calculated) 33.29    Single Leg Stand 2.35 seconds              Nutrition Therapy Plan and Nutrition Goals:  Nutrition Therapy & Goals - 07/24/21 1048       Nutrition Therapy   Diet Heart healthy, low Na, pulmonary MNT, T2DM    Protein (specify units) 110g    Fiber 25 grams    Whole Grain Foods 3 servings    Saturated Fats 12 max. grams    Fruits and Vegetables 8 servings/day    Sodium 1.5 grams      Personal Nutrition Goals   Nutrition Goal ST: work on adding things to her snacks (protein, fiber, or fat)  LT: Continue to keep A1C </= 7, meet protein needs, combine fiber/fat/protein    Comments PYP 61. Last A1C 7.2 on metformin. Had a cholecystectomy in 2009 and continues to eat lower amounts of fat. She likes salty and sweet foods and she feels that gets in the way of her eating healthy sometimes. B: protein shake: almond milk, strawberris, banana, protein powder  or oikos yogurt with granola L: 1/2 sandwich with fries or chips D: meat (chicken mostly) and salad or a chicken salad. She uses olive oil, butter, and sometimes bacon in her green beans. Doesn't eat many fried foods. Her weight is stable. She reports that if her husband and her were to go out to the movies they would split popcorn and candy, discussed maybe having some popcorn and then bringing some peanuts - the extra  fiber, fat, and protein with help to balance this snack/meal better so it is less carbohydrates and she has a better blood sugar response as well as increasing her protein intake. Discussed heart healthy/T2DM eating as well as pulmonary MNT.      Intervention Plan   Intervention Prescribe, educate and counsel regarding individualized specific dietary modifications aiming towards targeted core components such as weight, hypertension, lipid management, diabetes, heart failure and other comorbidities.    Expected Outcomes Short Term Goal: Understand basic principles of dietary content, such as calories, fat, sodium, cholesterol and nutrients.;Short Term Goal: A plan has been developed with personal nutrition goals set during dietitian appointment.;Long Term Goal: Adherence to prescribed nutrition plan.             Nutrition Assessments:  MEDIFICTS Score Key: ?70 Need to make dietary changes  40-70 Heart Healthy Diet ? 40 Therapeutic Level Cholesterol Diet  [facility]  Picture Your Plate Total Score on Admission 61      Picture Your Plate Scores: <71 Unhealthy dietary pattern with much room for improvement. 41-50 Dietary pattern unlikely to meet recommendations for good health and room for improvement. 51-60 More healthful dietary pattern, with some room for improvement.  >60 Healthy dietary pattern, although there may be some specific behaviors that could be improved.   Nutrition Goals Re-Evaluation:  Nutrition Goals Re-Evaluation     [place] Name 06/26/21 1045 07/10/21 1003 07/31/21 1023 09/04/21 1012       Goals   Current Weight -- 201 lb (91.2 kg) -- --    Nutrition Goal -- meet with dietitian -- ST: work on adding things to her snacks (protein, fiber, or fat)  LT: Continue to keep A1C </= 7, meet protein needs, combine fiber/fat/protein    Comment Patient has not yet met with program dietician. Patient has not  yet met with program dietician. Lyndsay is stil working on dietary changes.  She says it is more difficult this time of year. Cynthis is doing well in rehab. She has lost some weight and doing pretty well with her diet.  She is back on track now that the holidays behind her.  She continues to improve her diet and trying to find more balance again    Expected Outcome Short: meed with dieticain. Short: meed with dieticain. Long: adhere to a diet that pertains to patient. Short:  continue to make small changes Long: reach dietary goals as suggested by RD Short: Continue to adjust back to diet Long: Continue to try to get more variety into her diet.             Nutrition Goals Discharge (Final Nutrition Goals Re-Evaluation):  Nutrition Goals Re-Evaluation - 09/04/21 1012       Goals   Nutrition Goal ST: work on adding things to her snacks (protein, fiber, or fat)  LT: Continue to keep A1C </= 7, meet protein needs, combine fiber/fat/protein    Comment Cynthis is doing well in rehab. She has lost some weight and doing pretty well with her diet.  She is back on track now that the holidays behind her.  She continues to improve her diet and trying to find more balance again    Expected Outcome Short: Continue to adjust back to diet Long: Continue to try to get more variety into her diet.             Psychosocial: Target Goals: Acknowledge presence or absence of significant depression and/or stress, maximize coping skills, provide positive support system. Participant is able to verbalize types and ability to use techniques and skills needed for reducing stress and depression.   Education: Stress, Anxiety, and Depression - Group verbal and visual presentation to define topics covered.  Reviews how body is impacted by stress, anxiety, and depression.  Also discusses healthy ways to reduce stress and to treat/manage anxiety and depression.  Written material given at graduation.   Education: Sleep  Hygiene -Provides group verbal and written instruction about how sleep can affect your health.  Define sleep hygiene, discuss sleep cycles and impact of sleep habits. Review good sleep hygiene tips.    Initial Review & Psychosocial Screening:  Initial Psych Review & Screening - 05/04/21 1453       Initial Review   Current issues with None Identified      Family Dynamics   Good Support System? Yes   husband and children  2 nearby  one in [place]     Barriers   Psychosocial barriers to participate in program There are no identifiable barriers or psychosocial needs.      Screening Interventions   Interventions Encouraged to exercise;To provide support and resources with identified psychosocial needs;Provide feedback about the scores to participant    Expected Outcomes Short Term goal: Utilizing psychosocial counselor, staff and physician to assist with identification of specific Stressors or current issues interfering with healing process. Setting desired goal for each stressor or current issue identified.;Long Term Goal: Stressors or current issues are controlled or eliminated.;Short Term goal: Identification and review with participant of any Quality of Life or Depression concerns found by scoring the questionnaire.;Long Term goal: The participant improves quality of Life and PHQ9 Scores as seen by post scores and/or verbalization of changes             Quality of Life Scores:  Scores of 19 and below usually indicate a poorer quality of life in these areas.  A difference of  2-3 points is a clinically meaningful difference.  A difference of 2-3 points in the total score of the Quality of Life Index has been associated with significant improvement in overall quality of life, self-image, physical symptoms, and general health in studies assessing change in quality of life.  PHQ-9: Recent Review Flowsheet Data     Depression screen [facility] 2/9 09/01/2021 07/17/2021 07/10/2021 06/26/2021  05/18/2021   Decreased Interest 1 0 1 1 1    Down, Depressed, Hopeless 0 0 0 1 1   PHQ - 2 Score 1 0 1 2 2    Altered sleeping 1 1 3 1 1    Tired, decreased energy 2 2 2 2 2    Change in appetite 1 1 3 1 1    Feeling bad or failure about yourself  0 0 0 0 1   Trouble concentrating 2 0 0 1 1   Moving slowly or fidgety/restless 0 0 0 1 0   Suicidal thoughts 0 0 0 0 0   PHQ-9 Score 7 4 9 8 8    Difficult doing work/chores Not difficult at all Not difficult at all Not difficult at all Not difficult at all Somewhat difficult      Interpretation of Total Score  Total Score Depression Severity:  1-4 = Minimal depression, 5-9 = Mild depression, 10-14 = Moderate depression, 15-19 = Moderately severe depression, 20-27 = Severe depression   Psychosocial Evaluation and Intervention:  Psychosocial Evaluation - 05/04/21 1505       Psychosocial Evaluation & Interventions   Interventions Encouraged to exercise with the program and follow exercise prescription    Comments Paxton has no barriers to attending the program. She wants to gain back some of her staamina and improve her breathing. She lives with her husband of 73 years. She has him as part of her support, as well as 3 children- 2 nearby and one in [place]. She had been in the program in 2018 and is ready to do what she can to acheive her goals.    Expected Outcomes STG: Daleiza is able to attend all scheduled sessions. She is able to see improvment in her breathing and stamina. LTG: Zephyr continues with her exercise progress and symptom control    Continue Psychosocial Services  Follow up required by staff             Psychosocial Re-Evaluation:  Psychosocial Re-Evaluation     [place] Name 06/26/21 1046 07/10/21 1001 07/17/21 1033  07/31/21 1025 09/01/21 1039     Psychosocial Re-Evaluation   Current issues with -- -- -- Current Sleep Concerns Current Stress Concerns   Comments Patient reports no new stress or sleep concerns. She did a PHQ  reevaluation with a score of 8, but attributes most of her difficulties to her physical condition, not to mental health concerns. Reviewed patient health questionnaire (PHQ-9) with patient for follow up. Previously, patients score indicated signs/symptoms of depression.  Reviewed to see if patient is improving symptom wise while in program.  Score declined and patient states that it is because she is not sleeping well and feels like she has little energy. Reviewed patient health questionnaire (PHQ-9) with patient for follow up. Previously, patients score indicated signs/symptoms of depression.  Reviewed to see if patient is improving symptom wise while in program.  Score declined and patient states that it is because she is not sleeping well and feels like she has little energy. [place] is sleeping a little better now.  She does usually nap during the day and stay up later at night. Reviewed patient health questionnaire (PHQ-9) with patient for follow up. Previously, patients score indicated signs/symptoms of depression.  Reviewed to see if patient is improving symptom wise while in program.  Score declined and patient states that it is because her energy levels have been lower.   Expected Outcomes Short: continue to attend pulmonary rehab consistently. Long: maintian good metal health habits. Short: Continue to work toward an improvement in [place] scores by attending [facility] regularly. Long: Continue to improve stress and depression coping skills by talking with staff and attending [facility] regularly and work toward a positive mental state. Short: Continue to work toward an improvement in [place] scores by attending [facility] regularly. Long: Continue to improve stress and depression coping skills by talking with staff and attending [facility] regularly and work toward a positive mental state. Short:   continue to work on sleep patterns Long: feel rested day to day Short: Continue to work toward an improvement in  [place] scores by attending [facility] regularly. Long: Continue to improve stress and depression coping skills by talking with staff and attending [facility] regularly and work toward a positive mental state.   Interventions Encouraged to attend Pulmonary Rehabilitation for the exercise Encouraged to attend Pulmonary Rehabilitation for the exercise -- -- Encouraged to attend Pulmonary Rehabilitation for the exercise   Continue Psychosocial Services  Follow up required by staff Follow up required by staff -- -- Follow up required by staff            Psychosocial Discharge (Final Psychosocial Re-Evaluation):  Psychosocial Re-Evaluation - 09/01/21 1039       Psychosocial Re-Evaluation   Current issues with Current Stress Concerns    Comments Reviewed patient health questionnaire (PHQ-9) with patient for follow up. Previously, patients score indicated signs/symptoms of depression.  Reviewed to see if patient is improving symptom wise while in program.  Score declined and patient states that it is because her energy levels have been lower.    Expected Outcomes Short: Continue to work toward an improvement in [place] scores by attending [facility] regularly. Long: Continue to improve stress and depression coping skills by talking with staff and attending [facility] regularly and work toward a positive mental state.    Interventions Encouraged to attend Pulmonary Rehabilitation for the exercise    Continue Psychosocial Services  Follow up required by staff             Education: Education  Goals: Education classes will be provided on a weekly basis, covering required topics. Participant will state understanding/return demonstration of topics presented.  Learning Barriers/Preferences:  Learning Barriers/Preferences - 05/04/21 1456       Learning Barriers/Preferences   Learning Barriers None    Learning Preferences None             General  Pulmonary Education Topics:  Infection Prevention: - Provides verbal and written material to individual with discussion of infection control including proper hand washing and proper equipment cleaning during exercise session. Flowsheet Row Pulmonary Rehab from 08/30/2021 in [facility] Cardiac and Pulmonary Rehab  Date 05/18/21  Educator AS  Instruction Review Code 1- Verbalizes Understanding       Falls Prevention: - Provides verbal and written material to individual with discussion of falls prevention and safety. Flowsheet Row Pulmonary Rehab from 08/30/2021 in [facility] Cardiac and Pulmonary Rehab  Date 05/18/21  Educator AS  Instruction Review Code 1- Verbalizes Understanding       Chronic Lung Disease Review: - Group verbal instruction with posters, models, PowerPoint presentations and videos,  to review new updates, new respiratory medications, new advancements in procedures and treatments. Providing information on websites and "800" numbers for continued self-education. Includes information about supplement oxygen, available portable oxygen systems, continuous and intermittent flow rates, oxygen safety, concentrators, and Medicare reimbursement for oxygen. Explanation of Pulmonary Drugs, including class, frequency, complications, importance of spacers, rinsing mouth after steroid MDI's, and proper cleaning methods for nebulizers. Review of basic lung anatomy and physiology related to function, structure, and complications of lung disease. Review of risk factors. Discussion about methods for diagnosing sleep apnea and types of masks and machines for OSA. Includes a review of the use of types of environmental controls: home humidity, furnaces, filters, dust mite/pet prevention, HEPA vacuums. Discussion about weather changes, air quality and the benefits of nasal washing. Instruction on Warning signs, infection symptoms, calling MD promptly, preventive modes, and value of vaccinations. Review of effective  airway clearance, coughing and/or vibration techniques. Emphasizing that all should Create an Action Plan. Written material given at graduation. Flowsheet Row Pulmonary Rehab from 08/30/2021 in [facility] Cardiac and Pulmonary Rehab  Date 05/31/21  Educator [facility]  Instruction Review Code 1- Verbalizes Understanding       AED/CPR: - Group verbal and written instruction with the use of models to demonstrate the basic use of the AED with the basic ABC's of resuscitation.    Anatomy and Cardiac Procedures: - Group verbal and visual presentation and models provide information about basic cardiac anatomy and function. Reviews the testing methods done to diagnose heart disease and the outcomes of the test results. Describes the treatment choices: Medical Management, Angioplasty, or Coronary Bypass Surgery for treating various heart conditions including Myocardial Infarction, Angina, Valve Disease, and Cardiac Arrhythmias.  Written material given at graduation. Flowsheet Row Pulmonary Rehab from 08/30/2021 in [facility] Cardiac and Pulmonary Rehab  Date 08/30/21  Educator SB  Instruction Review Code 1- Verbalizes Understanding       Medication Safety: - Group verbal and visual instruction to review commonly prescribed medications for heart and lung disease. Reviews the medication, class of the drug, and side effects. Includes the steps to properly store meds and maintain the prescription regimen.  Written material given at graduation.   Other: -Provides group and verbal instruction on various topics (see comments)   Knowledge Questionnaire Score:  Knowledge Questionnaire Score - 05/18/21 1555       Knowledge Questionnaire Score  Pre Score 15/18              Core Components/Risk Factors/Patient Goals at Admission:  Personal Goals and Risk Factors at Admission - 05/18/21 1600       Core Components/Risk Factors/Patient Goals on Admission    Weight Management Yes    Intervention Weight  Management: Develop a combined nutrition and exercise program designed to reach desired caloric intake, while maintaining appropriate intake of nutrient and fiber, sodium and fats, and appropriate energy expenditure required for the weight goal.;Weight Management/Obesity: Establish reasonable short term and long term weight goals.    Admit Weight 204 lb (92.5 kg)    Goal Weight: Short Term 200 lb (90.7 kg)    Goal Weight: Long Term 150 lb (68 kg)    Expected Outcomes Short Term: Continue to assess and modify interventions until short term weight is achieved;Long Term: Adherence to nutrition and physical activity/exercise program aimed toward attainment of established weight goal;Weight Loss: Understanding of general recommendations for a balanced deficit meal plan, which promotes 1-2 lb weight loss per week and includes a negative energy balance of [phone] kcal/d    Improve shortness of breath with ADL's Yes    Intervention Provide education, individualized exercise plan and daily activity instruction to help decrease symptoms of SOB with activities of daily living.    Expected Outcomes Short Term: Improve cardiorespiratory fitness to achieve a reduction of symptoms when performing ADLs;Long Term: Be able to perform more ADLs without symptoms or delay the onset of symptoms    Intervention Provide education, demonstration and support about specific breathing techniuqes utilized for more efficient breathing. Include techniques such as pursed lipped breathing, diaphragmatic breathing and self-pacing activity.    Expected Outcomes Short Term: Participant will be able to demonstrate and use breathing techniques as needed throughout daily activities.    Increase knowledge of respiratory medications and ability to use respiratory devices properly  Yes    Intervention Provide education and demonstration as needed of appropriate use of medications, inhalers, and oxygen therapy.    Expected Outcomes Short Term:  Achieves understanding of medications use. Understands that oxygen is a medication prescribed by physician. Demonstrates appropriate use of inhaler and oxygen therapy.;Long Term: Maintain appropriate use of medications, inhalers, and oxygen therapy.    Diabetes Yes    Intervention Provide education about signs/symptoms and action to take for hypo/hyperglycemia.;Provide education about proper nutrition, including hydration, and aerobic/resistive exercise prescription along with prescribed medications to achieve blood glucose in normal ranges: Fasting glucose 65-99 mg/dL    Expected Outcomes Short Term: Participant verbalizes understanding of the signs/symptoms and immediate care of hyper/hypoglycemia, proper foot care and importance of medication, aerobic/resistive exercise and nutrition plan for blood glucose control.;Long Term: Attainment of HbA1C < 7%.    Heart Failure Yes    Intervention Provide a combined exercise and nutrition program that is supplemented with education, support and counseling about heart failure. Directed toward relieving symptoms such as shortness of breath, decreased exercise tolerance, and extremity edema.    Expected Outcomes Improve functional capacity of life;Short term: Attendance in program 2-3 days a week with increased exercise capacity. Reported lower sodium intake. Reported increased fruit and vegetable intake. Reports medication compliance.;Short term: Daily weights obtained and reported for increase. Utilizing diuretic protocols set by physician.;Long term: Adoption of self-care skills and reduction of barriers for early signs and symptoms recognition and intervention leading to self-care maintenance.    Hypertension Yes    Intervention Provide education on lifestyle  modifcations including regular physical activity/exercise, weight management, moderate sodium restriction and increased consumption of fresh fruit, vegetables, and low fat dairy, alcohol moderation, and  smoking cessation.;Monitor prescription use compliance.    Expected Outcomes Short Term: Continued assessment and intervention until BP is < 140/64m HG in hypertensive participants. < 130/860mHG in hypertensive participants with diabetes, heart failure or chronic kidney disease.;Long Term: Maintenance of blood pressure at goal levels.    Lipids Yes    Intervention Provide education and support for participant on nutrition & aerobic/resistive exercise along with prescribed medications to achieve LDL <70106mHDL >61m51m  Expected Outcomes Short Term: Participant states understanding of desired cholesterol values and is compliant with medications prescribed. Participant is following exercise prescription and nutrition guidelines.;Long Term: Cholesterol controlled with medications as prescribed, with individualized exercise RX and with personalized nutrition plan. Value goals: LDL < 70mg70mL > 40 mg.             Education:Diabetes - Individual verbal and written instruction to review signs/symptoms of diabetes, desired ranges of glucose level fasting, after meals and with exercise. Acknowledge that pre and post exercise glucose checks will be done for 3 sessions at entry of program. Flowsheet Row Pulmonary Rehab from 08/30/2021 in [facility] and Pulmonary Rehab  Date 05/18/21  Educator AS  Instruction Review Code 1- Verbalizes Understanding       Know Your Numbers and Heart Failure: - Group verbal and visual instruction to discuss disease risk factors for cardiac and pulmonary disease and treatment options.  Reviews associated critical values for Overweight/Obesity, Hypertension, Cholesterol, and Diabetes.  Discusses basics of heart failure: signs/symptoms and treatments.  Introduces Heart Failure Zone chart for action plan for heart failure.  Written material given at graduation.   Core Components/Risk Factors/Patient Goals Review:   Goals and Risk Factor Review     Row Name 06/26/21  1041 07/10/21 1004 07/31/21 1020 09/04/21 1014       Core Components/Risk Factors/Patient Goals Review   Personal Goals Review Weight Management/Obesity;Develop more efficient breathing techniques such as purse lipped breathing and diaphragmatic breathing and practicing self-pacing with activity.;Heart Failure;Increase knowledge of respiratory medications and ability to use respiratory devices properly.;Hypertension;Improve shortness of breath with ADL's;Diabetes;Lipids Improve shortness of breath with ADL's Improve shortness of breath with ADL's;Develop more efficient breathing techniques such as purse lipped breathing and diaphragmatic breathing and practicing self-pacing with activity. Improve shortness of breath with ADL's;Weight Management/Obesity;Hypertension;Heart Failure;Diabetes;Lipids;Increase knowledge of respiratory medications and ability to use respiratory devices properly.    Review Patient reports reports that she is taking all her medications. She stated that her Diabetes medication bothers her stomach, and since having COVID it has made it even worse. She is working on building back to her full dose but at this current time she is long able to tolerate a half dose. She is keeping a close eye on her blood sugar as she works back up to her full dose. Patient does not weigh every day and she was encouraged to do so to keep an eye on weight and fluid levels. Spoke to patient about their shortness of breath and what they can do to improve. Patient has been informed of breathing techniques when starting the program. Patient is informed to tell staff if they have had any med changes and that certain meds they are taking or not taking can be causing shortness of breath. CynthTedranot noticed any big changes in her breathing since starting to exercise.  She  has had some stiffness from arthritis.  She does practice PLB at home Zamaria is doing well  in rehab. Her weight has gone down some and she  hopes that it will continue to improve now that the holidays are over.  She does not check her sugars at home, but has not noticed any major swings.  Her pressures are getting better and she does check them on occasion at home. She has chronic swelling in her left foot but she notes that it is lower than in the past.  She is doing well with her medications. Her metformin messes with her stomach with double dose so  she has halved her dose and symptoms are more manageble and her doctor is aware. Her breathing is doing fairly well.  She does not need her oxygen at home but will use it when she goes out and about.    Expected Outcomes Short: work back up to full dose of DM medications and communicate any concerns with doctor. Weight every day. Long: continue improvements with SOB and continue to manage disease with medical care team. Short: Attend [facility] regularly to improve shortness of breath with ADLs. Long: maintain independence with ADLs Short: continue to exercise to help with breathing Long:  beccome proficient at PLB Short: continue to work on breathing Long: continue to monitor risk factors.             Core Components/Risk Factors/Patient Goals at Discharge (Final Review):   Goals and Risk Factor Review - 09/04/21 1014       Core Components/Risk Factors/Patient Goals Review   Personal Goals Review Improve shortness of breath with ADL's;Weight Management/Obesity;Hypertension;Heart Failure;Diabetes;Lipids;Increase knowledge of respiratory medications and ability to use respiratory devices properly.    Review Shella is doing well  in rehab. Her weight has gone down some and she hopes that it will continue to improve now that the holidays are over.  She does not check her sugars at home, but has not noticed any major swings.  Her pressures are getting better and she does check them on occasion at home. She has chronic swelling in her left foot but she notes that it is lower than in the past.   She is doing well with her medications. Her metformin messes with her stomach with double dose so  she has halved her dose and symptoms are more manageble and her doctor is aware. Her breathing is doing fairly well.  She does not need her oxygen at home but will use it when she goes out and about.    Expected Outcomes Short: continue to work on breathing Long: continue to monitor risk factors.             ITP Comments:  ITP Comments     Row Name 05/04/21 1511 05/18/21 1606 05/29/21 0954 05/31/21 1429 06/19/21 1513   ITP Comments Virtual orientation call completed today. shehas an appointment on Date: 05/18/2021  for EP eval and gym Orientation.  Documentation of diagnosis can be found in [facility] Date:   02/07/2021 Completed 6MWT and gym orientation. Initial ITP created and sent for review to Dr. Ottie Glazier, Medical Director. First full day of exercise!  Patient was oriented to gym and equipment including functions, settings, policies, and procedures.  Patient's individual exercise prescription and treatment plan were reviewed.  All starting workloads were established based on the results of the 6 minute walk test done at initial orientation visit.  The plan for exercise progression was also introduced  and progression will be customized based on patient's performance and goals. 30 day review completed. ITP sent to Dr. Zetta Bills, Medical Director of  Pulmonary Rehab. Continue with ITP unless changes are made by physician. Pt is currently out with COVID.  She is due to return on 10/31    Row Name 06/28/21 0815 07/24/21 1153 07/26/21 0751 08/23/21 1121 09/20/21 0926   ITP Comments 30 Day review completed. Medical Director ITP review done, changes made as directed, and signed approval by Medical Director. Completed initial RD consultation 30 Day review completed. Medical Director ITP review done, changes made as directed, and signed approval by Medical Director. 30 Day review completed. Medical  Director ITP review done, changes made as directed, and signed approval by Medical Director. 30 Day review completed. Medical Director ITP review done, changes made as directed, and signed approval by Medical Director.   3 visits since last review            Comments:

## 2021-09-25 ENCOUNTER — Encounter: Payer: Self-pay | Admitting: Family Medicine

## 2021-09-27 ENCOUNTER — Encounter: Payer: Medicare PPO | Attending: Emergency Medicine

## 2021-09-27 DIAGNOSIS — J984 Other disorders of lung: Secondary | ICD-10-CM | POA: Insufficient documentation

## 2021-09-28 ENCOUNTER — Telehealth: Payer: Self-pay

## 2021-09-28 NOTE — Telephone Encounter (Signed)
Betty [name] has been out due to her husbands cataract surgery - plans to return tomorrow

## 2021-09-29 ENCOUNTER — Encounter: Payer: Medicare PPO | Admitting: *Deleted

## 2021-09-29 ENCOUNTER — Other Ambulatory Visit: Payer: Self-pay

## 2021-09-29 DIAGNOSIS — J984 Other disorders of lung: Secondary | ICD-10-CM | POA: Diagnosis not present

## 2021-09-29 NOTE — Progress Notes (Signed)
Daily Session Note  Patient Details  Name: Betty [name] MRN: [id number] Date of Birth: [birth year] Referring Provider:   April Manson Pulmonary Rehab from 05/18/2021 in [facility] Cardiac and Pulmonary Rehab  Referring Provider [name]       Encounter Date: 09/29/2021  Check In:  Session Check In - 09/29/21 1001       Check-In   Supervising physician immediately available to respond to emergencies See telemetry face sheet for immediately available ER MD    Location [facility]-Cardiac & Pulmonary Rehab    Staff Present Renita Papa, RN BSN;Joseph [place], RCP,RRT,BSRT;Jessica [place], [place], RCEP, CCRP, CCET    Virtual Visit No    Medication changes reported     No    Fall or balance concerns reported    No    Warm-up and Cool-down Performed on first and last piece of equipment    Resistance Training Performed Yes    VAD Patient? No    PAD/SET Patient? No      Pain Assessment   Currently in Pain? No/denies                Social History   Tobacco Use  Smoking Status Never  Smokeless Tobacco Never    Goals Met:  Independence with exercise equipment Exercise tolerated well No report of concerns or symptoms today Strength training completed today  Goals Unmet:  Not Applicable  Comments: Pt able to follow exercise prescription today without complaint.  Will continue to monitor for progression.    Dr. Emily Filbert is Medical Director for [place].  Dr. Ottie Glazier is Medical Director for [facility] Pulmonary Rehabilitation.

## 2021-10-02 ENCOUNTER — Other Ambulatory Visit: Payer: Self-pay

## 2021-10-02 ENCOUNTER — Encounter: Payer: Medicare PPO | Admitting: *Deleted

## 2021-10-02 DIAGNOSIS — J984 Other disorders of lung: Secondary | ICD-10-CM | POA: Diagnosis not present

## 2021-10-02 NOTE — Progress Notes (Signed)
Daily Session Note  Patient Details  Name: Betty [name] MRN: [id number] Date of Birth: 1945-09-28 Referring Provider:   April Manson Pulmonary Rehab from 05/18/2021 in [facility] Cardiac and Pulmonary Rehab  Referring Provider [name]       Encounter Date: 10/02/2021  Check In:  Session Check In - 10/02/21 1028       Check-In   Supervising physician immediately available to respond to emergencies See telemetry face sheet for immediately available ER MD    Location [facility]-Cardiac & Pulmonary Rehab    Staff Present Renita Papa, RN Moises Blood, BS, ACSM CEP, Exercise Physiologist;Amanda Oletta Darter, [place], ACSM CEP, Exercise Physiologist    Virtual Visit No    Medication changes reported     No    Fall or balance concerns reported    No    Warm-up and Cool-down Performed on first and last piece of equipment    Resistance Training Performed Yes    VAD Patient? No    PAD/SET Patient? No      Pain Assessment   Currently in Pain? No/denies                Social History   Tobacco Use  Smoking Status Never  Smokeless Tobacco Never    Goals Met:  Independence with exercise equipment Exercise tolerated well No report of concerns or symptoms today Strength training completed today  Goals Unmet:  Not Applicable  Comments: Pt able to follow exercise prescription today without complaint.  Will continue to monitor for progression.    Dr. Emily Filbert is Medical Director for [place].  Dr. Ottie Glazier is Medical Director for [facility] Pulmonary Rehabilitation.

## 2021-10-04 ENCOUNTER — Other Ambulatory Visit: Payer: Self-pay

## 2021-10-04 DIAGNOSIS — J984 Other disorders of lung: Secondary | ICD-10-CM

## 2021-10-04 DIAGNOSIS — J9610 Chronic respiratory failure, unspecified whether with hypoxia or hypercapnia: Secondary | ICD-10-CM | POA: Diagnosis not present

## 2021-10-04 NOTE — Progress Notes (Signed)
Daily Session Note  Patient Details  Name: MANJU KULKARNI MRN: [id number] Date of Birth: [birth year] Referring Provider:   April Manson Pulmonary Rehab from 05/18/2021 in [facility] Cardiac and Pulmonary Rehab  Referring Provider [name]       Encounter Date: 10/04/2021  Check In:  Session Check In - 10/04/21 0953       Check-In   Supervising physician immediately available to respond to emergencies See telemetry face sheet for immediately available ER MD    Location [facility]-Cardiac & Pulmonary Rehab    Staff Present [name], MPA, RN;Melissa [place], RDN, Rowe Pavy, BA, ACSM CEP, Exercise Physiologist    Virtual Visit No    Medication changes reported     No    Fall or balance concerns reported    No    Warm-up and Cool-down Performed on first and last piece of equipment    Resistance Training Performed Yes    VAD Patient? No    PAD/SET Patient? No      Pain Assessment   Currently in Pain? No/denies                Social History   Tobacco Use  Smoking Status Never  Smokeless Tobacco Never    Goals Met:  Independence with exercise equipment Exercise tolerated well No report of concerns or symptoms today Strength training completed today  Goals Unmet:  Not Applicable  Comments: Pt able to follow exercise prescription today without complaint.  Will continue to monitor for progression.    Dr. Emily Filbert is Medical Director for [place].  Dr. Ottie Glazier is Medical Director for [facility] Pulmonary Rehabilitation.

## 2021-10-06 ENCOUNTER — Other Ambulatory Visit: Payer: Self-pay

## 2021-10-06 ENCOUNTER — Encounter: Payer: Medicare PPO | Admitting: *Deleted

## 2021-10-06 DIAGNOSIS — J984 Other disorders of lung: Secondary | ICD-10-CM | POA: Diagnosis not present

## 2021-10-06 NOTE — Progress Notes (Signed)
Daily Session Note  Patient Details  Name: Betty [name] MRN: [id number] Date of Birth: September 25, 1945 Referring Provider:   April Manson Pulmonary Rehab from 05/18/2021 in [facility] Cardiac and Pulmonary Rehab  Referring Provider [name]       Encounter Date: 10/06/2021  Check In:  Session Check In - 10/06/21 1002       Check-In   Supervising physician immediately available to respond to emergencies See telemetry face sheet for immediately available ER MD    Location [facility]-Cardiac & Pulmonary Rehab    Staff Present Renita Papa, RN BSN;Joseph [place], RCP,RRT,BSRT;Jessica Max, [place], RCEP, CCRP, CCET    Virtual Visit No    Medication changes reported     No    Warm-up and Cool-down Performed on first and last piece of equipment    Resistance Training Performed Yes    VAD Patient? No    PAD/SET Patient? No      Pain Assessment   Currently in Pain? No/denies                Social History   Tobacco Use  Smoking Status Never  Smokeless Tobacco Never    Goals Met:  Independence with exercise equipment Exercise tolerated well No report of concerns or symptoms today Strength training completed today  Goals Unmet:  Not Applicable  Comments: Pt able to follow exercise prescription today without complaint.  Will continue to monitor for progression.    Dr. Emily Filbert is Medical Director for [place].  Dr. Ottie Glazier is Medical Director for [facility] Pulmonary Rehabilitation.

## 2021-10-10 ENCOUNTER — Other Ambulatory Visit: Payer: Self-pay

## 2021-10-10 ENCOUNTER — Ambulatory Visit: Payer: Medicare PPO | Admitting: Emergency Medicine

## 2021-10-10 ENCOUNTER — Ambulatory Visit (INDEPENDENT_AMBULATORY_CARE_PROVIDER_SITE_OTHER): Payer: Medicare PPO

## 2021-10-10 ENCOUNTER — Encounter: Payer: Self-pay | Admitting: Emergency Medicine

## 2021-10-10 VITALS — BP 124/68 | HR 86 | Temp 97.7°F | Ht 65.0 in | Wt 198.6 lb

## 2021-10-10 DIAGNOSIS — R0609 Other forms of dyspnea: Secondary | ICD-10-CM | POA: Diagnosis not present

## 2021-10-10 DIAGNOSIS — J849 Interstitial pulmonary disease, unspecified: Secondary | ICD-10-CM | POA: Diagnosis not present

## 2021-10-10 DIAGNOSIS — J984 Other disorders of lung: Secondary | ICD-10-CM

## 2021-10-10 DIAGNOSIS — G4733 Obstructive sleep apnea (adult) (pediatric): Secondary | ICD-10-CM | POA: Diagnosis not present

## 2021-10-10 DIAGNOSIS — J9611 Chronic respiratory failure with hypoxia: Secondary | ICD-10-CM

## 2021-10-10 NOTE — Patient Instructions (Addendum)
We will perform a chest x-ray today Please continue to use your oxygen at 3 L/min when you exert yourself. Continue CPAP every night Complete your pulmonary rehab as planned. Okay to keep albuterol available to use 2 puffs if needed for shortness of breath.  Keep track of whether it helps you so we can decide whether to continue it or any other inhaled medication in the future. Follow Dr. Lamonte Sakai in 1 year or sooner if you have any problems.

## 2021-10-10 NOTE — Assessment & Plan Note (Signed)
She has confirmed desaturations when she exerts.  We will continue 3 L/min with exertion.  Also bled into her CPAP at night.

## 2021-10-10 NOTE — Assessment & Plan Note (Signed)
Great compliance with her CPAP.  Plan to continue nightly

## 2021-10-10 NOTE — Progress Notes (Signed)
Subjective:    Patient ID: Betty [name], female    DOB: 06-Nov-1945, 76 y.o.   MRN: [id number]  HPI 76 year old never smoker who has longstanding shortness of breath.  History of OSA on CPAP, diabetes, hypertension, nonischemic cardiomyopathy with subsequent improvement in LV function.  Her pulmonary function testing has been consistent with restrictive lung disease and a markedly decreased diffusion capacity.  Most recently with some evidence of mixed restriction and obstruction, possible small airways disease  She underwent a surgical lung biopsy to evaluate possible ILD (diffuse mosaic attenuation).  No evidence for connective tissue disease so she underwent surgical lung biopsy that showed mild peribronchial fibrotic change without inflammation and few areas of significant arterial pathic PAH.  She has had right heart catheterization that was normal.  She has diastolic dysfunction that was noted on exercise right heart catheterization.    She has O2 that she has been told to wear at 3L/min at all times, but she does not use reliably. Not on any BD's - didn't really benefit. Some asymmetric swelling LE L>R with negative doppler in the past. Wt stable. Uses diuretics prn. She feels that her breathing is at baseline, remains sedentary, breathing does significantly limit her activity. Great compliance with her CPAP and good clinical benefit.   ROV 10/10/21 --follow-up visit for 76 year old woman with history of OSA on CPAP, restrictive lung disease by pulmonary function testing and exertional hypoxemia.  She is on 3 L/min with exertion.  No significant response from bronchodilators in the past, did a trial of albuterol after her last visit 6 months ago - has not felt that she needed it.  Also I referred her to pulmonary rehab at [facility].  She reports that she started it in 10/22 but then had a break from it in for COVID (minimal symptoms). She has restarted - does feel that she has benefited.      Review of Systems As per HPI  Past Medical History:  Diagnosis Date   Allergic rhinitis    never tested, fall and spring   Arthritis    hands,knees, feet   Cataracts, bilateral    not surgical yet   CHOLECYSTECTOMY, HX OF 10/08/2007   Qualifier: Diagnosis of  By: Council Mechanic MD, [name]    Chronic diastolic CHF (congestive heart failure) (HCC)    Chronic respiratory failure ([place])    Diabetes mellitus without complication ([place])    Diverticulosis    problems with frequent gas, burping   Glucose intolerance (impaired glucose tolerance)    Gout    History of chicken pox    Hyperlipidemia    Hypertension    NICM (nonischemic cardiomyopathy) ([place]) 2002   EF 25%; improved to normal - echo 4/08: EF 50-55%, mild MR, mild LAE, mild TR;    cath 3/03: normal cors, EF 40%   Obesity    Oxygen deficiency 2017   at night   PONV (postoperative nausea and vomiting)    after wisdom tooth extraction   Restrictive lung disease    Skin cancer    skin cancers only   Sleep apnea    cpap settings 4     Family History  Problem Relation Age of Onset   Lymphoma Mother 14   COPD Father        was a smoker   Stroke Father    Pneumonia Father    Diabetes Father    Hyperlipidemia Father    Heart disease Father    Heart  attack Father    Hypertension Father    Diabetes Sister    Depression Sister    Meniere's disease Brother    Diabetes Paternal Grandfather    Heart disease Paternal Grandfather    Schizophrenia Daughter    Bladder Cancer Maternal Grandmother    Leukemia Maternal Grandfather    Hypertension Brother    Colon cancer Neg Hx    Stomach cancer Neg Hx    Breast cancer Neg Hx    Colon polyps Neg Hx    Esophageal cancer Neg Hx    Rectal cancer Neg Hx      Social History   Socioeconomic History   Marital status: Married    Spouse name: Not on file   Number of children: 2   Years of education: Not on file   Highest education level: Not on file  Occupational  History   Occupation: Retired Product manager: retired   Occupation: Works at [employer] Use   Smoking status: Never   Smokeless tobacco: Never  Vaping Use   Vaping Use: Never used  Substance and Sexual Activity   Alcohol use: No    Alcohol/week: 0.0 standard drinks   Drug use: No   Sexual activity: Not Currently  Other Topics Concern   Not on file  Social History Narrative   Lives in [place] with husband. 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 No pets.   Social Determinants of Health   Financial Resource Strain: Low Risk    Difficulty of Paying Living Expenses: Not hard at all  Food Insecurity: No Food Insecurity   Worried About Charity fundraiser in the Last Year: Never true   [place] in the Last Year: Never true  Transportation Needs: No Transportation Needs   Lack of Transportation (Medical): No   Lack of Transportation (Non-Medical): No  Physical Activity: Not on file  Stress: No Stress Concern Present   Feeling of Stress : Not at all  Social Connections: Socially Integrated   Frequency of Communication with Friends and Family: More than three times a week   Frequency of Social Gatherings with Friends and Family: More than three times a week   Attends Religious Services: More than 4 times per year   Active Member of [employer] or Organizations: Yes   Attends Music therapist: More than 4 times per year   Marital Status: Married  Human resources officer Violence: Not At Risk   Fear of Current or Ex-Partner: No   Emotionally Abused: No   Physically Abused: No   Sexually Abused: No     Allergies  Allergen Reactions   Amoxicillin Rash    REACTION: RASH   Etodolac Other (See Comments)    Bloody stool    Hydrochlorothiazide Other (See Comments)    Increases gout flare   Meloxicam Other (See Comments)    constipation   Sulfa Antibiotics Other (See Comments)    rash   Allopurinol    Metformin And Related     diarrhea    Rosuvastatin Other (See Comments)    "extreme joint pain"   Sulfonamide Derivatives     REACTION: RASH   Jardiance [Empagliflozin] Other (See Comments)    Yeast infection   Lasix [Furosemide] Other (See Comments)    Pt states "it dried everything out of me"     Outpatient Medications Prior to Visit  Medication Sig Dispense Refill   acetic acid 2 % otic  solution Insert saturated wick of cotton; keep moist 24 hours by adding 3 to 5 drops every 4 to 6 hours; remove wick after 24 hours and instill 5 drops 3 to 4 times daily 15 mL 0   albuterol (VENTOLIN HFA) 108 (90 Base) MCG/ACT inhaler Inhale 2 puffs into the lungs every 4 (four) hours as needed for wheezing or shortness of breath. 8 g 6   aspirin 81 MG EC tablet Take 1 tablet (81 mg total) by mouth daily. 90 tablet 2   CALCIUM-MAGNESIUM-VITAMIN D PO Take 1 tablet by mouth daily.     carvedilol (COREG) 12.5 MG tablet TAKE 1 TABLET BY MOUTH TWICE A DAY WITH MEALS 60 tablet 11   clobetasol (TEMOVATE) 0.05 % GEL Apply topically 2 (two) times daily.     Coenzyme Q10 (CO Q 10 PO) Take 200 mg by mouth at bedtime.      Colchicine (MITIGARE) 0.6 MG CAPS Day 1: take 1.2 mg (2 tablets) by mouth at the first sign of flare, followed by 0.6 mg (1 tablet) by mouth after 1 hour. Day 2 and thereafter: take 0.6 mg (1 tablet) by mouth daily until flare has resolved. 30 capsule 0   Fish Oil OIL Take 1,200 mg by mouth 2 (two) times daily. GUMMIES     fluticasone (FLONASE) 50 MCG/ACT nasal spray Place 2 sprays into both nostrils daily. 16 g 6   losartan (COZAAR) 50 MG tablet TAKE 1/2 TABLET BY MOUTH DAILY 45 tablet 3   metFORMIN (GLUCOPHAGE XR) 500 MG 24 hr tablet Take 2 tablets (1,000 mg total) by mouth 2 (two) times daily with a meal. (Patient taking differently: Take 500 mg by mouth 2 (two) times daily with a meal.) 360 tablet 1   multivitamin (THERAGRAN) per tablet Take 1 tablet by mouth daily.     nystatin cream (MYCOSTATIN) Apply 1 application topically 2  (two) times daily. 30 g 0   polyvinyl alcohol (LIQUIFILM TEARS) 1.4 % ophthalmic solution Place 1 drop into both eyes every morning.     pravastatin (PRAVACHOL) 20 MG tablet TAKE 1 TABLET BY MOUTH EVERY DAY 90 tablet 1   No facility-administered medications prior to visit.           Objective:   Physical Exam Vitals:   10/10/21 1129  BP: 124/68  Pulse: 86  Temp: 97.7 F (36.5 C)  TempSrc: Oral  SpO2: 98%  Weight: 198 lb 9.6 oz (90.1 kg)  Height: 5\' 5"  (1.651 m)   Gen: Pleasant, obese woman, in no distress,  normal affect  ENT: No lesions,  mouth clear,  oropharynx clear, no postnasal drip  Neck: No JVD, no stridor  Lungs: No use of accessory muscles, distant, no crackles or wheezing on normal respiration, no wheeze on forced expiration  Cardiovascular: RRR, heart sounds normal, no murmur or gallops, no peripheral edema  Musculoskeletal: No deformities, no cyanosis or clubbing  Neuro: alert, awake, non focal  Skin: Warm, no lesions or rash       Assessment & Plan:  Restrictive lung disease Chest x-ray today to follow for any resolution, evolving changes from ILD.  She is clinically stable so suspect that her imaging will be stable as well.  She is benefiting from pulmonary rehab.  No plans to make any changes at this time  Obstructive sleep apnea Great compliance with her CPAP.  Plan to continue nightly  Chronic hypoxemic respiratory failure ([place]) She has confirmed desaturations when she exerts.  We will  continue 3 L/min with exertion.  Also bled into her CPAP at night.  DOE (dyspnea on exertion) Multifactorial with significant contribution from her restrictive lung disease, deconditioning.  She follows reliably with cardiology.  I gave her albuterol to try but she has never taken it.  Not clear to me that she needs a bronchodilator.  She will keep it available, if she gets significant benefit then we can consider possible scheduled BD in the future   Baltazar Apo, MD, PhD 10/10/2021, 11:50 AM [facility] [phone] or if no answer before 7:00PM call [phone] For any issues after 7:00PM please call eLink [phone]

## 2021-10-10 NOTE — Assessment & Plan Note (Signed)
Chest x-ray today to follow for any resolution, evolving changes from ILD.  She is clinically stable so suspect that her imaging will be stable as well.  She is benefiting from pulmonary rehab.  No plans to make any changes at this time

## 2021-10-10 NOTE — Assessment & Plan Note (Signed)
Multifactorial with significant contribution from her restrictive lung disease, deconditioning.  She follows reliably with cardiology.  I gave her albuterol to try but she has never taken it.  Not clear to me that she needs a bronchodilator.  She will keep it available, if she gets significant benefit then we can consider possible scheduled BD in the future

## 2021-10-11 DIAGNOSIS — J984 Other disorders of lung: Secondary | ICD-10-CM

## 2021-10-11 NOTE — Progress Notes (Signed)
Daily Session Note  Patient Details  Name: Betty [name] MRN: [id number] Date of Birth: 02-Dec-1945 Referring Provider:   April Manson Pulmonary Rehab from 05/18/2021 in [facility] Card[facility] and [facility] Rehab  Referring Provider [name]       Encounter Date: 10/11/2021  Check In:  Session Check In - 10/11/21 0958       Check-In   Supervising physician immediately available to respond to emergencies See telemetry face sheet for immediately available ER MD    Location [facility]-Cardiac & Pulmonary Rehab    Staff Present [name], MPA, RN;Joseph Tessie Fass, RCP,RRT,BSRT;Amanda Oletta Darter, [place], ACSM CEP, Exercise Physiologist    Virtual Visit No    Medication changes reported     No    Fall or balance concerns reported    No    Warm-up and Cool-down Performed on first and last piece of equipment    Resistance Training Performed Yes    VAD Patient? No    PAD/SET Patient? No      Pain Assessment   Currently in Pain? No/denies                Social History   Tobacco Use  Smoking Status Never  Smokeless Tobacco Never    Goals Met:  Independence with exercise equipment Exercise tolerated well No report of concerns or symptoms today Strength training completed today  Goals Unmet:  Not Applicable  Comments: Pt able to follow exercise prescription today without complaint.  Will continue to monitor for progression.    Dr. Emily Filbert is Medical Director for [place].  Dr. Ottie Glazier is Medical Director for [facility] Pulmonary Rehabilitation.

## 2021-10-13 ENCOUNTER — Other Ambulatory Visit: Payer: Self-pay

## 2021-10-13 ENCOUNTER — Encounter: Payer: Medicare PPO | Admitting: *Deleted

## 2021-10-13 DIAGNOSIS — J984 Other disorders of lung: Secondary | ICD-10-CM | POA: Diagnosis not present

## 2021-10-13 NOTE — Progress Notes (Signed)
Daily Session Note  Patient Details  Name: Betty [name] MRN: [id number] Date of Birth: 10-Feb-1946 Referring Provider:   April Manson Pulmonary Rehab from 05/18/2021 in [facility] Card[facility] and Pulmonary Rehab  Referring Provider [name]       Encounter Date: 10/13/2021  Check In:  Session Check In - 10/13/21 0949       Check-In   Supervising physician immediately available to respond to emergencies See telemetry face sheet for immediately available ER MD    Location [facility]-Cardiac & Pulmonary Rehab    Staff Present Renita Papa, RN BSN;Joseph [place], RCP,RRT,BSRT;Jessica [place]el[place], [place], RCEP, CCRP, CCET    Virtual Visit No    Medication changes reported     No    Fall or balance concerns reported    No    Warm-up and Cool-down Performed on first and last piece of equipment    Resistance Training Performed Yes    VAD Patient? No    PAD/SET Patient? No      Pain Assessment   Currently in Pain? No/denies                Social History   Tobacco Use  Smoking Status Never  Smokeless Tobacco Never    Goals Met:  Independence with exercise equipment Exercise tolerated well No report of concerns or symptoms today Strength training completed today  Goals Unmet:  Not Applicable  Comments: Pt able to follow exercise prescription today without complaint.  Will continue to monitor for progression.    Dr. Emily Filbert is Medical Director for [place].  Dr. Ottie Glazier is Medical Director for [facility] Pulmonary Rehabilitation.

## 2021-10-16 ENCOUNTER — Other Ambulatory Visit: Payer: Self-pay

## 2021-10-16 ENCOUNTER — Encounter: Payer: Medicare PPO | Admitting: *Deleted

## 2021-10-16 DIAGNOSIS — J984 Other disorders of lung: Secondary | ICD-10-CM | POA: Diagnosis not present

## 2021-10-16 NOTE — Progress Notes (Signed)
Daily Session Note  Patient Details  Name: Betty [name] MRN: [id number] Date of Birth: 06-12-1946 Referring Provider:   April Manson Pulmonary Rehab from 05/18/2021 in [facility] Cardiac and Pulmonary Rehab  Referring Provider [name]       Encounter Date: 10/16/2021  Check In:  Session Check In - 10/16/21 1110       Check-In   Supervising physician immediately available to respond to emergencies See telemetry face sheet for immediately available ER MD    Location [facility]-Cardiac & Pulmonary Rehab    Staff Present Renita Papa, RN BSN;Joseph [place], RCP,RRT,BSRT;Kelly [place], [place], ACSM CEP, Exercise Physiologist    Virtual Visit No    Medication changes reported     No    Fall or balance concerns reported    No    Warm-up and Cool-down Performed on first and last piece of equipment    Resistance Training Performed Yes    VAD Patient? No    PAD/SET Patient? No      Pain Assessment   Currently in Pain? No/denies                Social History   Tobacco Use  Smoking Status Never  Smokeless Tobacco Never    Goals Met:  Independence with exercise equipment Exercise tolerated well No report of concerns or symptoms today Strength training completed today  Goals Unmet:  Not Applicable  Comments: Pt able to follow exercise prescription today without complaint.  Will continue to monitor for progression.    Dr. Emily Filbert is Medical Director for [place].  Dr. Ottie Glazier is Medical Director for [facility] Pulmonary Rehabilitation.

## 2021-10-18 ENCOUNTER — Other Ambulatory Visit: Payer: Self-pay

## 2021-10-18 ENCOUNTER — Encounter: Payer: Self-pay | Admitting: *Deleted

## 2021-10-18 DIAGNOSIS — J984 Other disorders of lung: Secondary | ICD-10-CM | POA: Diagnosis not present

## 2021-10-18 NOTE — Progress Notes (Signed)
Daily Session Note  Patient Details  Name: Betty [name] MRN: [id number] Date of Birth: [birth year] Referring Provider:   April Manson Pulmonary Rehab from 05/18/2021 in [facility] Rehab  Referring Provider [name]       Encounter Date: 10/18/2021  Check In:  Session Check In - 10/18/21 1007       Check-In   Supervising physician immediately available to respond to emergencies See telemetry face sheet for immediately available ER MD    Location [facility]-Cardiac & Pulmonary Rehab    Staff Present [name], MPA, RN;Joseph Tessie Fass, RCP,RRT,BSRT;Amanda Oletta Darter, [place], ACSM CEP, Exercise Physiologist    Virtual Visit No    Medication changes reported     No    Fall or balance concerns reported    No    Warm-up and Cool-down Performed on first and last piece of equipment    Resistance Training Performed Yes    VAD Patient? No    PAD/SET Patient? No      Pain Assessment   Currently in Pain? No/denies                Social History   Tobacco Use  Smoking Status Never  Smokeless Tobacco Never    Goals Met:  Independence with exercise equipment Exercise tolerated well No report of concerns or symptoms today Strength training completed today  Goals Unmet:  Not Applicable  Comments: Pt able to follow exercise prescription today without complaint.  Will continue to monitor for progression.    Dr. Emily Filbert is Medical Director for [place].  Dr. Ottie Glazier is Medical Director for [facility] Pulmonary Rehabilitation.

## 2021-10-18 NOTE — Progress Notes (Signed)
Pulmonary Individual Treatment Plan  Patient Details  Name: Betty [name] MRN: [id number] Date of Birth: 09/21/1945 Referring Provider:   April Manson Pulmonary Rehab from 05/18/2021 in [facility] Cardiac and Pulmonary Rehab  Referring Provider [name]       Initial Encounter Date:  Flowsheet Row Pulmonary Rehab from 05/18/2021 in [facility] Cardiac and Pulmonary Rehab  Date 05/18/21       Visit Diagnosis: Restrictive lung disease  Patient's Home Medications on Admission:  Current Outpatient Medications:    acetic acid 2 % otic solution, Insert saturated wick of cotton; keep moist 24 hours by adding 3 to 5 drops every 4 to 6 hours; remove wick after 24 hours and instill 5 drops 3 to 4 times daily, Disp: 15 mL, Rfl: 0   albuterol (VENTOLIN HFA) 108 (90 Base) MCG/ACT inhaler, Inhale 2 puffs into the lungs every 4 (four) hours as needed for wheezing or shortness of breath., Disp: 8 g, Rfl: 6   aspirin 81 MG EC tablet, Take 1 tablet (81 mg total) by mouth daily., Disp: 90 tablet, Rfl: 2   CALCIUM-MAGNESIUM-VITAMIN D PO, Take 1 tablet by mouth daily., Disp: , Rfl:    carvedilol (COREG) 12.5 MG tablet, TAKE 1 TABLET BY MOUTH TWICE A DAY WITH MEALS, Disp: 60 tablet, Rfl: 11   clobetasol (TEMOVATE) 0.05 % GEL, Apply topically 2 (two) times daily., Disp: , Rfl:    Coenzyme Q10 (CO Q 10 PO), Take 200 mg by mouth at bedtime. , Disp: , Rfl:    Colchicine (MITIGARE) 0.6 MG CAPS, Day 1: take 1.2 mg (2 tablets) by mouth at the first sign of flare, followed by 0.6 mg (1 tablet) by mouth after 1 hour. Day 2 and thereafter: take 0.6 mg (1 tablet) by mouth daily until flare has resolved., Disp: 30 capsule, Rfl: 0   Fish Oil OIL, Take 1,200 mg by mouth 2 (two) times daily. GUMMIES, Disp: , Rfl:    fluticasone (FLONASE) 50 MCG/ACT nasal spray, Place 2 sprays into both nostrils daily., Disp: 16 g, Rfl: 6   losartan (COZAAR) 50 MG tablet, TAKE 1/2 TABLET BY MOUTH DAILY, Disp: 45 tablet, Rfl: 3   metFORMIN (GLUCOPHAGE  XR) 500 MG 24 hr tablet, Take 2 tablets (1,000 mg total) by mouth 2 (two) times daily with a meal. (Patient taking differently: Take 500 mg by mouth 2 (two) times daily with a meal.), Disp: 360 tablet, Rfl: 1   multivitamin (THERAGRAN) per tablet, Take 1 tablet by mouth daily., Disp: , Rfl:    nystatin cream (MYCOSTATIN), Apply 1 application topically 2 (two) times daily., Disp: 30 g, Rfl: 0   polyvinyl alcohol (LIQUIFILM TEARS) 1.4 % ophthalmic solution, Place 1 drop into both eyes every morning., Disp: , Rfl:    pravastatin (PRAVACHOL) 20 MG tablet, TAKE 1 TABLET BY MOUTH EVERY DAY, Disp: 90 tablet, Rfl: 1  Past Medical History: Past Medical History:  Diagnosis Date   Allergic rhinitis    never tested, fall and spring   Arthritis    hands,knees, feet   Cataracts, bilateral    not surgical yet   CHOLECYSTECTOMY, HX OF 10/08/2007   Qualifier: Diagnosis of  By: Council Mechanic MD, [name]    Chronic diastolic CHF (congestive heart failure) (HCC)    Chronic respiratory failure ([place])    Diabetes mellitus without complication ([place])    Diverticulosis    problems with frequent gas, burping   Glucose intolerance (impaired glucose tolerance)    Gout  History of chicken pox    Hyperlipidemia    Hypertension    NICM (nonischemic cardiomyopathy) ([place]) 2002   EF 25%; improved to normal - echo 4/08: EF 50-55%, mild MR, mild LAE, mild TR;    cath 3/03: normal cors, EF 40%   Obesity    Oxygen deficiency 2017   at night   PONV (postoperative nausea and vomiting)    after wisdom tooth extraction   Restrictive lung disease    Skin cancer    skin cancers only   Sleep apnea    cpap settings 4    Tobacco Use: Social History   Tobacco Use  Smoking Status Never  Smokeless Tobacco Never    Labs: Recent Review Flowsheet Data     Labs for ITP Cardiac and Pulmonary Rehab Latest Ref Rng & Units 11/14/2020 12/26/2020 02/21/2021 04/11/2021 05/17/2021   Cholestrol 0 - 200 mg/dL - - - - -   LDLCALC 0 -  99 mg/dL - - - - -   LDLDIRECT mg/dL 114.0 86.0 79.0 63.0 -   HDL >39.00 mg/dL - - - - -   Trlycerides 0.0 - 149.0 mg/dL - - - - -   Hemoglobin A1c 4.6 - 6.5 % 6.9(H) - - - 7.2(H)   HCO3 20.0 - 24.0 mEq/L - - - - -   TCO2 0 - 100 mmol/L - - - - -   O2SAT % - - - - -        Pulmonary Assessment Scores:  Pulmonary Assessment Scores     Row Name 05/18/21 1557         ADL UCSD   ADL Phase Entry     SOB Score total 51     Rest 0     Walk 2     Stairs 2     Bath 1     Dress 1     Shop 3       CAT Score   CAT Score 15       mMRC Score   mMRC Score 2              UCSD: Self-administered rating of dyspnea associated with activities of daily living (ADLs) 6-point scale (0 = "not at all" to 5 = "maximal or unable to do because of breathlessness")  Scoring Scores range from 0 to 120.  Minimally important difference is 5 units  CAT: CAT can identify the health impairment of COPD patients and is better correlated with disease progression.  CAT has a scoring range of zero to 40. The CAT score is classified into four groups of low (less than 10), medium (10 - 20), high (21-30) and very high (31-40) based on the impact level of disease on health status. A CAT score over 10 suggests significant symptoms.  A worsening CAT score could be explained by an exacerbation, poor medication adherence, poor inhaler technique, or progression of COPD or comorbid conditions.  CAT MCID is 2 points  mMRC: mMRC (Modified Medical Research Council) Dyspnea Scale is used to assess the degree of baseline functional disability in patients of respiratory disease due to dyspnea. No minimal important difference is established. A decrease in score of 1 point or greater is considered a positive change.   Pulmonary Function Assessment:   Exercise Target Goals: Exercise Program Goal: Individual exercise prescription set using results from initial 6 min walk test and THRR while considering  patients  activity barriers and safety.   Exercise  Prescription Goal: Initial exercise prescription builds to 30-45 minutes a day of aerobic activity, 2-3 days per week.  Home exercise guidelines will be given to patient during program as part of exercise prescription that the participant will acknowledge.  Education: Aerobic Exercise: - Group verbal and visual presentation on the components of exercise prescription. Introduces F.I.T.T principle from ACSM for exercise prescriptions.  Reviews F.I.T.T. principles of aerobic exercise including progression. Written material given at graduation.   Education: Resistance Exercise: - Group verbal and visual presentation on the components of exercise prescription. Introduces F.I.T.T principle from ACSM for exercise prescriptions  Reviews F.I.T.T. principles of resistance exercise including progression. Written material given at graduation. Flowsheet Row Pulmonary Rehab from 10/11/2021 in [facility] Cardiac and Pulmonary Rehab  Date 08/30/21  Educator [facility]  Instruction Review Code 1- Verbalizes Understanding        Education: Exercise & Equipment Safety: - Individual verbal instruction and demonstration of equipment use and safety with use of the equipment. Flowsheet Row Pulmonary Rehab from 10/11/2021 in [facility] Cardiac and Pulmonary Rehab  Date 05/18/21  Educator AS  Instruction Review Code 1- Verbalizes Understanding       Education: Exercise Physiology & General Exercise Guidelines: - Group verbal and written instruction with models to review the exercise physiology of the cardiovascular system and associated critical values. Provides general exercise guidelines with specific guidelines to those with heart or lung disease.    Education: Flexibility, Balance, Mind/Body Relaxation: - Group verbal and visual presentation with interactive activity on the components of exercise prescription. Introduces F.I.T.T principle from ACSM for exercise prescriptions. Reviews  F.I.T.T. principles of flexibility and balance exercise training including progression. Also discusses the mind body connection.  Reviews various relaxation techniques to help reduce and manage stress (i.e. Deep breathing, progressive muscle relaxation, and visualization). Balance handout provided to take home. Written material given at graduation.   Activity Barriers & Risk Stratification:  Activity Barriers & Cardiac Risk Stratification - 05/04/21 1448       Activity Barriers & Cardiac Risk Stratification   Activity Barriers Deconditioning;Other (comment);Shortness of Breath    Comments neuropathy in feet             6 Minute Walk:  6 Minute Walk     Row Name 05/18/21 1540         6 Minute Walk   Phase Initial     Distance 1045 feet     Walk Time 6 minutes     # of Rest Breaks 0     MPH 1.98     METS 2.14     RPE 11     Perceived Dyspnea  2     VO2 Peak 7.5     Symptoms No     Resting HR 90 bpm     Resting BP 118/68     Resting Oxygen Saturation  95 %     Exercise Oxygen Saturation  during 6 min walk 89 %     Max Ex. HR 120 bpm     Max Ex. BP 138/84     2 Minute Post BP 126/78       Interval HR   1 Minute HR 107     2 Minute HR 73     3 Minute HR 107     4 Minute HR 117     5 Minute HR 119     6 Minute HR 120     2 Minute Post HR 100  Interval Heart Rate? Yes       Interval Oxygen   Interval Oxygen? Yes     Baseline Oxygen Saturation % 95 %     1 Minute Oxygen Saturation % 98 %     1 Minute Liters of Oxygen 3 L     2 Minute Oxygen Saturation % 95 %     2 Minute Liters of Oxygen 3 L     3 Minute Oxygen Saturation % 91 %     3 Minute Liters of Oxygen 3 L     4 Minute Oxygen Saturation % 92 %     4 Minute Liters of Oxygen 3 L     5 Minute Oxygen Saturation % 89 %     5 Minute Liters of Oxygen 3 L     6 Minute Oxygen Saturation % 89 %     6 Minute Liters of Oxygen 3 L     2 Minute Post Oxygen Saturation % 98 %     2 Minute Post Liters of Oxygen 3  L             Oxygen Initial Assessment:  Oxygen Initial Assessment - 05/04/21 1450       Home Oxygen   Sleep Oxygen Prescription CPAP;Continuous    Liters per minute 3    Home Exercise Oxygen Prescription Continuous    Liters per minute 3    Home Resting Oxygen Prescription Continuous    Liters per minute 3    Compliance with Home Oxygen Use Yes      Intervention   Short Term Goals To learn and exhibit compliance with exercise, home and travel O2 prescription;To learn and understand importance of monitoring SPO2 with pulse oximeter and demonstrate accurate use of the pulse oximeter.;To learn and understand importance of maintaining oxygen saturations>88%;To learn and demonstrate proper pursed lip breathing techniques or other breathing techniques. ;To learn and demonstrate proper use of respiratory medications    Long  Term Goals Exhibits compliance with exercise, home  and travel O2 prescription;Verbalizes importance of monitoring SPO2 with pulse oximeter and return demonstration;Maintenance of O2 saturations>88%;Exhibits proper breathing techniques, such as pursed lip breathing or other method taught during program session;Compliance with respiratory medication;Demonstrates proper use of MDIs             Oxygen Re-Evaluation:  Oxygen Re-Evaluation     [place] Name 05/29/21 0955 06/26/21 1051 07/10/21 1005 07/31/21 1027 09/04/21 1019     Program Oxygen Prescription   Program Oxygen Prescription E-Tanks E-Tanks E-Tanks -- E-Tanks   Liters per minute 3 3 3 3 3    Comments as needed for oxygen saturation below 88% as needed for oxygen saturation below 88% -- as needed for oxygen saturation below 88% as needed for oxygen saturation below 88%     Home Oxygen   Home Oxygen Device -- -- Home Concentrator;Portable Concentrator Home Concentrator;Portable Concentrator Home Concentrator;Portable Concentrator   Sleep Oxygen Prescription CPAP;Continuous CPAP;Continuous CPAP;Continuous  CPAP;Continuous CPAP;Continuous   Liters per minute 3 3 3 3 3    Home Exercise Oxygen Prescription Continuous Continuous Continuous Continuous Continuous   Liters per minute 3 3 3 3 3    Home Resting Oxygen Prescription Continuous Continuous None None None   Liters per minute 3 3 3 3  --   Compliance with Home Oxygen Use Yes Yes  Complient with night time use. Uses O2 as needed during the day, but not at all times. Yes Yes Yes     Goals/Expected  Outcomes   Short Term Goals To learn and exhibit compliance with exercise, home and travel O2 prescription;To learn and understand importance of monitoring SPO2 with pulse oximeter and demonstrate accurate use of the pulse oximeter.;To learn and understand importance of maintaining oxygen saturations>88%;To learn and demonstrate proper pursed lip breathing techniques or other breathing techniques.  To learn and exhibit compliance with exercise, home and travel O2 prescription;To learn and understand importance of monitoring SPO2 with pulse oximeter and demonstrate accurate use of the pulse oximeter.;To learn and understand importance of maintaining oxygen saturations>88%;To learn and demonstrate proper pursed lip breathing techniques or other breathing techniques.  To learn and understand importance of maintaining oxygen saturations>88%;To learn and understand importance of monitoring SPO2 with pulse oximeter and demonstrate accurate use of the pulse oximeter. To learn and understand importance of maintaining oxygen saturations>88%;To learn and understand importance of monitoring SPO2 with pulse oximeter and demonstrate accurate use of the pulse oximeter. To learn and understand importance of maintaining oxygen saturations>88%;To learn and understand importance of monitoring SPO2 with pulse oximeter and demonstrate accurate use of the pulse oximeter.;To learn and exhibit compliance with exercise, home and travel O2 prescription;To learn and demonstrate proper pursed  lip breathing techniques or other breathing techniques. ;To learn and demonstrate proper use of respiratory medications   Long  Term Goals Exhibits compliance with exercise, home  and travel O2 prescription;Verbalizes importance of monitoring SPO2 with pulse oximeter and return demonstration;Maintenance of O2 saturations>88%;Exhibits proper breathing techniques, such as pursed lip breathing or other method taught during program session;Compliance with respiratory medication Exhibits compliance with exercise, home  and travel O2 prescription;Verbalizes importance of monitoring SPO2 with pulse oximeter and return demonstration;Maintenance of O2 saturations>88%;Exhibits proper breathing techniques, such as pursed lip breathing or other method taught during program session;Compliance with respiratory medication Verbalizes importance of monitoring SPO2 with pulse oximeter and return demonstration;Maintenance of O2 saturations>88% Verbalizes importance of monitoring SPO2 with pulse oximeter and return demonstration;Maintenance of O2 saturations>88% Verbalizes importance of monitoring SPO2 with pulse oximeter and return demonstration;Maintenance of O2 saturations>88%;Exhibits compliance with exercise, home  and travel O2 prescription;Exhibits proper breathing techniques, such as pursed lip breathing or other method taught during program session;Compliance with respiratory medication;Demonstrates proper use of MDIs   Comments Reviewed PLB technique with pt.  Talked about how it works and it's importance in maintaining their exercise saturations. Patient stated that she does not wear her oxygen at all times during the day, but monitors her SaO2 levels and wears her O2 if her numbers drop below 88%. She reports most of the time that her SaO2 remains in the low 90's. She does consistently wear her oxygen at night while sleeping. She has a pulse oximeter to check her oxygen saturation at home. Informed  and explained why it  is important to have one. Reviewed that oxygen saturations should be 88 percent and above. Patient verbalizes understanding. Etherine uses oxygen if she is going out, or her husband pushes her in a Database administrator.  She does check oxygen at home and it is usauly 90 or above. Mardell is doing well with her oxygen compliance.  She uses her CPAP nightly.  She has found now that she does not need her oxygen as much at home, but will use it when she goes out and about.  Her saturations are staying up and she will put on her oxygen if she feels like it is getting low.   Goals/Expected Outcomes Short: Become more profiecient at using PLB.  Long: Become independent at using PLB. Short: beocme more consistent with home O2 use. Long: become independent in controlling SOB with O2 useage and PLB techniques. Short: monitor oxygen at home with exertion. Long: maintain oxygen saturations above 88 percent independently. Short/Long: continue to monitor at home and keep sats above 88% Short: Continue to use PLB to help with breathing Long: Conitnue to improve SOB    Row Name 10/06/21 1012             Program Oxygen Prescription   Program Oxygen Prescription E-Tanks       Liters per minute 3         Home Oxygen   Home Oxygen Device Home Concentrator;Portable Concentrator       Sleep Oxygen Prescription CPAP;Continuous       Liters per minute 3       Home Exercise Oxygen Prescription Continuous       Liters per minute 3       Home Resting Oxygen Prescription None       Liters per minute 3  PRN       Compliance with Home Oxygen Use Yes         Goals/Expected Outcomes   Short Term Goals To learn and understand importance of maintaining oxygen saturations>88%;To learn and understand importance of monitoring SPO2 with pulse oximeter and demonstrate accurate use of the pulse oximeter.       Long  Term Goals Maintenance of O2 saturations>88%;Verbalizes importance of monitoring SPO2 with pulse oximeter and return  demonstration       Comments She has a pulse oximeter to check her oxygen saturation at home. Informed and explained why it is important to have one. Reviewed that oxygen saturations should be 88 percent and above. Patient Verbalizes understanding.       Goals/Expected Outcomes Short: monitor oxygen at home with exertion. Long: maintain oxygen saturations above 88 percent independently.                Oxygen Discharge (Final Oxygen Re-Evaluation):  Oxygen Re-Evaluation - 10/06/21 1012       Program Oxygen Prescription   Program Oxygen Prescription E-Tanks    Liters per minute 3      Home Oxygen   Home Oxygen Device Home Concentrator;Portable Concentrator    Sleep Oxygen Prescription CPAP;Continuous    Liters per minute 3    Home Exercise Oxygen Prescription Continuous    Liters per minute 3    Home Resting Oxygen Prescription None    Liters per minute 3   PRN   Compliance with Home Oxygen Use Yes      Goals/Expected Outcomes   Short Term Goals To learn and understand importance of maintaining oxygen saturations>88%;To learn and understand importance of monitoring SPO2 with pulse oximeter and demonstrate accurate use of the pulse oximeter.    Long  Term Goals Maintenance of O2 saturations>88%;Verbalizes importance of monitoring SPO2 with pulse oximeter and return demonstration    Comments She has a pulse oximeter to check her oxygen saturation at home. Informed and explained why it is important to have one. Reviewed that oxygen saturations should be 88 percent and above. Patient Verbalizes understanding.    Goals/Expected Outcomes Short: monitor oxygen at home with exertion. Long: maintain oxygen saturations above 88 percent independently.             Initial Exercise Prescription:  Initial Exercise Prescription - 05/18/21 1500       Date of Initial Exercise RX  and Referring Provider   Date 05/18/21    Referring Provider [name]      Oxygen   Oxygen Continuous    Liters  3    Maintain Oxygen Saturation 88% or higher      Treadmill   MPH 1.5    Grade 0    Minutes 15    METs 2.15      NuStep   Level 1    SPM 80    Minutes 15    METs 2      REL-XR   Level 1    Speed 50    Minutes 15    METs 2      T5 Nustep   Level 1    SPM 80    Minutes 15    METs 2      Track   Laps 20    Minutes 15    METs 2      Prescription Details   Frequency (times per week) 3    Duration Progress to 30 minutes of continuous aerobic without signs/symptoms of physical distress      Intensity   THRR 40-80% of Max Heartrate 112-135    Ratings of Perceived Exertion 11-13    Perceived Dyspnea 0-4      Resistance Training   Training Prescription Yes    Weight 3 lb    Reps 10-15             Perform Capillary Blood Glucose checks as needed.  Exercise Prescription Changes:   Exercise Prescription Changes     Row Name 05/18/21 1500 06/06/21 1400 06/19/21 1500 07/05/21 1100 07/17/21 1000     Response to Exercise   Blood Pressure (Admit) 118/68 128/76 122/76 110/70 122/64   Blood Pressure (Exercise) 138/84 132/60 138/62 130/60 122/62   Blood Pressure (Exit) 126/78 124/68 118/62 122/68 122/60   Heart Rate (Admit) 90 bpm 103 bpm 91 bpm 90 bpm 94 bpm   Heart Rate (Exercise) 120 bpm 114 bpm 113 bpm 115 bpm 116 bpm   Heart Rate (Exit) 100 bpm 110 bpm 93 bpm 98 bpm 107 bpm   Oxygen Saturation (Admit) 95 % 96 % 94 % 96 % 96 %   Oxygen Saturation (Exercise) 89 % 94 % 93 % 95 % 94 %   Oxygen Saturation (Exit) 98 % 96 % 97 % 96 % 95 %   Rating of Perceived Exertion (Exercise) 11 11 11 11 11    Perceived Dyspnea (Exercise) 2 2 2 2 1    Symptoms -- -- SOB -- --   Duration -- Progress to 30 minutes of  aerobic without signs/symptoms of physical distress Progress to 30 minutes of  aerobic without signs/symptoms of physical distress Continue with 30 min of aerobic exercise without signs/symptoms of physical distress. Continue with 30 min of aerobic exercise without  signs/symptoms of physical distress.   Intensity -- THRR unchanged THRR unchanged THRR unchanged THRR unchanged     Progression   Progression -- Continue to progress workloads to maintain intensity without signs/symptoms of physical distress. Continue to progress workloads to maintain intensity without signs/symptoms of physical distress. Continue to progress workloads to maintain intensity without signs/symptoms of physical distress. Continue to progress workloads to maintain intensity without signs/symptoms of physical distress.   Average METs -- 2.6 2.2 1.8 2.49     Resistance Training   Training Prescription -- Yes Yes Yes Yes   Weight -- 3 lb 3 lb 3 lb 3 lb  Reps -- 10-15 10-15 10-15 10-15     Interval Training   Interval Training -- -- No No No     Oxygen   Oxygen -- Continuous Continuous Continuous Continuous   Liters -- 3 3 3 3      Treadmill   MPH -- 1.6 1.9 1.9 2.5   Grade -- 0 0 0 0   Minutes -- 15 15 15 15    METs -- 2.23 2.45 2.45 2.91     NuStep   Level -- -- -- -- 3   Minutes -- -- -- -- 15   METs -- -- -- -- 2.4     Arm Ergometer   Level -- -- -- -- 4   Minutes -- -- -- -- 15   METs -- -- -- -- 1.2     REL-XR   Level -- 1 -- 1 1   Minutes -- 15 -- 15 15   METs -- 3 -- 1.2 2     T5 Nustep   Level -- -- 2 -- 2   Minutes -- -- 15 -- 15   METs -- -- 2 -- 2     Oxygen   Maintain Oxygen Saturation -- 88% or higher 88% or higher 88% or higher 88% or higher    Row Name 07/31/21 1300 08/14/21 1300 08/30/21 1200 09/11/21 1000 10/09/21 1600     Response to Exercise   Blood Pressure (Admit) 122/64 122/70 118/64 124/70 118/72   Blood Pressure (Exit) 118/62 102/66 112/66 104/62 102/58   Heart Rate (Admit) 99 bpm 99 bpm 84 bpm 89 bpm 88 bpm   Heart Rate (Exercise) 121 bpm 115 bpm 103 bpm 118 bpm 119 bpm   Heart Rate (Exit) 105 bpm 103 bpm 90 bpm 103 bpm 90 bpm   Oxygen Saturation (Admit) 99 % 99 % 97 % 100 % 93 %   Oxygen Saturation (Exercise) 97 % 96 % 98 %  98 % 98 %   Oxygen Saturation (Exit) 95 % 96 % 97 % 98 % 93 %   Rating of Perceived Exertion (Exercise) 12 11 11 13 11    Perceived Dyspnea (Exercise) 3 2 1 2 2    Symptoms -- SOB -- SOB SOB   Duration Continue with 30 min of aerobic exercise without signs/symptoms of physical distress. Continue with 30 min of aerobic exercise without signs/symptoms of physical distress. Continue with 30 min of aerobic exercise without signs/symptoms of physical distress. Continue with 30 min of aerobic exercise without signs/symptoms of physical distress. Continue with 30 min of aerobic exercise without signs/symptoms of physical distress.   Intensity THRR unchanged THRR unchanged THRR unchanged THRR unchanged THRR unchanged     Progression   Progression Continue to progress workloads to maintain intensity without signs/symptoms of physical distress. Continue to progress workloads to maintain intensity without signs/symptoms of physical distress. Continue to progress workloads to maintain intensity without signs/symptoms of physical distress. Continue to progress workloads to maintain intensity without signs/symptoms of physical distress. Continue to progress workloads to maintain intensity without signs/symptoms of physical distress.   Average METs 2.35 2.32 2.35 2.34 2.39     Resistance Training   Training Prescription Yes Yes Yes Yes Yes   Weight 3 lb 3 lb 3 lb 3 lb 3 lb   Reps 10-15 10-15 10-15 10-15 10-15     Interval Training   Interval Training No No No No No     Oxygen   Oxygen Continuous Continuous Continuous Continuous Continuous  Liters 3 3 3 3 3      Treadmill   MPH 2.2 2.2 2.2 2.2 2.3   Grade 0 0 0 0 0   Minutes 15 15 15 15 15    METs 2.69 2.68 2.68 2.69 2.76     NuStep   Level -- -- -- -- 3   Minutes -- -- -- -- 15   METs -- -- -- -- 2.2     REL-XR   Level -- 1 2 2 2    Minutes -- 15 15 15 15    METs -- 2.3 2 2  2.6     T5 Nustep   Level 2 2 -- 2 3   Minutes 15 15 -- 15 15   METs 2  2 -- 2 2     Home Exercise Plan   Plans to continue exercise at Home (comment) Home (comment) Home (comment) Home (comment) Home (comment)   Frequency Add 2 additional days to program exercise sessions. Add 2 additional days to program exercise sessions. Add 2 additional days to program exercise sessions. Add 2 additional days to program exercise sessions. Add 2 additional days to program exercise sessions.   Initial Home Exercises Provided 07/31/21 07/31/21 07/31/21 07/31/21 07/31/21     Oxygen   Maintain Oxygen Saturation 88% or higher 88% or higher 88% or higher 88% or higher 88% or higher            Exercise Comments:   Exercise Comments     Row Name 05/29/21 0954           Exercise Comments First full day of exercise!  Patient was oriented to gym and equipment including functions, settings, policies, and procedures.  Patient's individual exercise prescription and treatment plan were reviewed.  All starting workloads were established based on the results of the 6 minute walk test done at initial orientation visit.  The plan for exercise progression was also introduced and progression will be customized based on patient's performance and goals.                Exercise Goals and Review:   Exercise Goals     Row Name 05/18/21 1552             Exercise Goals   Increase Physical Activity Yes       Intervention Provide advice, education, support and counseling about physical activity/exercise needs.;Develop an individualized exercise prescription for aerobic and resistive training based on initial evaluation findings, risk stratification, comorbidities and participant's personal goals.       Expected Outcomes Short Term: Attend rehab on a regular basis to increase amount of physical activity.;Long Term: Add in home exercise to make exercise part of routine and to increase amount of physical activity.;Long Term: Exercising regularly at least 3-5 days a week.       Increase  Strength and Stamina Yes       Intervention Provide advice, education, support and counseling about physical activity/exercise needs.;Develop an individualized exercise prescription for aerobic and resistive training based on initial evaluation findings, risk stratification, comorbidities and participant's personal goals.       Expected Outcomes Short Term: Increase workloads from initial exercise prescription for resistance, speed, and METs.;Short Term: Perform resistance training exercises routinely during rehab and add in resistance training at home;Long Term: Improve cardiorespiratory fitness, muscular endurance and strength as measured by increased METs and functional capacity (6MWT)       Able to understand and use rate of perceived exertion (RPE) scale  Yes       Intervention Provide education and explanation on how to use RPE scale       Expected Outcomes Short Term: Able to use RPE daily in rehab to express subjective intensity level;Long Term:  Able to use RPE to guide intensity level when exercising independently       Able to understand and use Dyspnea scale Yes       Intervention Provide education and explanation on how to use Dyspnea scale       Expected Outcomes Short Term: Able to use Dyspnea scale daily in rehab to express subjective sense of shortness of breath during exertion;Long Term: Able to use Dyspnea scale to guide intensity level when exercising independently       Knowledge and understanding of Target Heart Rate Range (THRR) Yes       Intervention Provide education and explanation of THRR including how the numbers were predicted and where they are located for reference       Expected Outcomes Short Term: Able to state/look up THRR;Short Term: Able to use daily as guideline for intensity in rehab;Long Term: Able to use THRR to govern intensity when exercising independently       Able to check pulse independently Yes       Intervention Provide education and demonstration on how  to check pulse in carotid and radial arteries.;Review the importance of being able to check your own pulse for safety during independent exercise       Expected Outcomes Short Term: Able to explain why pulse checking is important during independent exercise;Long Term: Able to check pulse independently and accurately       Understanding of Exercise Prescription Yes       Intervention Provide education, explanation, and written materials on patient's individual exercise prescription       Expected Outcomes Short Term: Able to explain program exercise prescription;Long Term: Able to explain home exercise prescription to exercise independently                Exercise Goals Re-Evaluation :  Exercise Goals Re-Evaluation     Row Name 05/29/21 0954 06/06/21 1422 06/19/21 1514 06/26/21 1034 07/05/21 1200     Exercise Goal Re-Evaluation   Exercise Goals Review Increase Physical Activity;Able to understand and use rate of perceived exertion (RPE) scale;Knowledge and understanding of Target Heart Rate Range (THRR);Understanding of Exercise Prescription;Increase Strength and Stamina;Able to understand and use Dyspnea scale;Able to check pulse independently Increase Physical Activity;Increase Strength and Stamina Increase Physical Activity;Increase Strength and Stamina;Understanding of Exercise Prescription Increase Physical Activity;Increase Strength and Stamina;Understanding of Exercise Prescription --   Comments Reviewed RPE and dyspnea scales, THR and program prescription with pt today.  Pt voiced understanding and was given a copy of goals to take home. Shawniece is doing well so far.  She has increased to 1.6 on TM.  Oxygen has stayed int ths 90s on 3 L during exercise. Welma has only attended once since last review.  She is currently out with COVID.  We will continue to monitor her progress upon return (hopefully next week). Taneia has returned to class since having COVID. This was her first day back  She tolerated exercise well and felt like she should be able to get back to a good start attending rehab on a regular basis. Alaycia has increased TM to 1.9 mph.  She is ready to increase load on XR.  Staff will encourage moving to level 2-3 on XR.  Expected Outcomes Short: Use RPE daily to regulate intensity. Long: Follow program prescription in THR. Short:  attend consistently Long:improve overall stamina Short: Return to consistent attendance Long: Continue to improve stamina Short: return to consistent rehab attendance. Long: Continue to [employer]. Short: increase level on XR Long:  increase overall MET level    Row Name 07/17/21 1030 07/31/21 1036 08/14/21 1304 08/30/21 1234 09/04/21 1010     Exercise Goal Re-Evaluation   Exercise Goals Review Increase Physical Activity;Increase Strength and Stamina;Understanding of Exercise Prescription Increase Physical Activity;Increase Strength and Stamina Increase Physical Activity;Increase Strength and Stamina;Understanding of Exercise Prescription Increase Strength and Stamina;Increase Physical Activity Increase Strength and Stamina;Increase Physical Activity;Understanding of Exercise Prescription   Comments Jonte is doing well in rehab. She has already increased her treadmill speed to 2.5 mph. She should be encouraged to add a small incline to the treadmill. She does hit her [place] most sessions. Will continue to monitor. Reviewed home exercise with pt today.  Pt plans to walk and use stationary bike for exercise.  Reviewed THR, pulse, RPE, sign and symptoms, pulse oximetery and when to call 911 or MD.  Also discussed weather considerations and indoor options.  Pt voiced understanding. Alise is doing well in rehab.  She is currently at 2.2 mph on the treadmill.  We will encourage her to move up her workloads to continue to make improvements.  We will continue to monitor her progress. Anaalicia has missed several sessions due to the holidays but is ready to  get back.  She was able to work at previous levels this session. We will continue to monitor progress. Saba is doing well in rehab. She is not doing much for exercise at home on her off days.  However, she does her best to stay active and keep moving up until 6pm and then she is tired for the day. She is starting to get back her strength and stamina.   Expected Outcomes Short: Add incline to treadmill Long: Continue to build up overall strength and stamina Short: monitor HR and O2 whe exercising at home Long: become independent with exercise Short: Increase workloads Long: Conitnue to improve stamina Short: get back to consistent attendance Long:  build overall stamina Short: try to add in exercise at home Long: continue to improve stamina.    [place] Name 09/11/21 1011 10/06/21 1010 10/09/21 1559         Exercise Goal Re-Evaluation   Exercise Goals Review Increase Physical Activity;Increase Strength and Stamina Increase Physical Activity;Increase Strength and Stamina;Understanding of Exercise Prescription Increase Physical Activity;Increase Strength and Stamina;Understanding of Exercise Prescription     Comments Rahmah's attendance has been sporadic and she will be out the next couple of weeks due to doctor appointments.  She has been working at the same level on the T5 and XR as well as the same speed with no incline on the treadmill. She would at least benefit from adding incline to the treadmill in addition to increase speed as tolerated. Will continue to monitor When Cynthis is done she is not sure where she wants to exercise. She is thinking about joining the [facility] down the hall. She wants to continue to exercise after the program is over. Shakeisha is doing well in rehab.  She is up to 2.3 mph on the treadmill and up to level 3 on the steppers.  We will conitnue to monitor her progress.     Expected Outcomes Short: Add incline to the treadmill Long: Increase overall  MET level Short: think about  signing up for a gym membership. Long: join a gym. Short: Keep steppers at level 3 consistently Long; Conitnue to look at how to keep exercising after graduation              Discharge Exercise Prescription (Final Exercise Prescription Changes):  Exercise Prescription Changes - 10/09/21 1600       Response to Exercise   Blood Pressure (Admit) 118/72    Blood Pressure (Exit) 102/58    Heart Rate (Admit) 88 bpm    Heart Rate (Exercise) 119 bpm    Heart Rate (Exit) 90 bpm    Oxygen Saturation (Admit) 93 %    Oxygen Saturation (Exercise) 98 %    Oxygen Saturation (Exit) 93 %    Rating of Perceived Exertion (Exercise) 11    Perceived Dyspnea (Exercise) 2    Symptoms SOB    Duration Continue with 30 min of aerobic exercise without signs/symptoms of physical distress.    Intensity THRR unchanged      Progression   Progression Continue to progress workloads to maintain intensity without signs/symptoms of physical distress.    Average METs 2.39      Resistance Training   Training Prescription Yes    Weight 3 lb    Reps 10-15      Interval Training   Interval Training No      Oxygen   Oxygen Continuous    Liters 3      Treadmill   MPH 2.3    Grade 0    Minutes 15    METs 2.76      NuStep   Level 3    Minutes 15    METs 2.2      REL-XR   Level 2    Minutes 15    METs 2.6      T5 Nustep   Level 3    Minutes 15    METs 2      Home Exercise Plan   Plans to continue exercise at Home (comment)    Frequency Add 2 additional days to program exercise sessions.    Initial Home Exercises Provided 07/31/21      Oxygen   Maintain Oxygen Saturation 88% or higher             Nutrition:  Target Goals: Understanding of nutrition guidelines, daily intake of sodium <1540m, cholesterol <2037m calories 30% from fat and 7% or less from saturated fats, daily to have 5 or more servings of fruits and vegetables.  Education: All About Nutrition: -Group instruction  provided by verbal, written material, interactive activities, discussions, models, and posters to present general guidelines for heart healthy nutrition including fat, fiber, MyPlate, the role of sodium in heart healthy nutrition, utilization of the nutrition label, and utilization of this knowledge for meal planning. Follow up email sent as well. Written material given at graduation. [facility]  Date 07/12/21  [facility] 1- Verbalizes Understanding       Biometrics:  Pre Biometrics - 05/18/21 1552       Pre Biometrics   Height 5' 5.5" (1.664 m)    Weight 203 lb 3.2 oz (92.2 kg)    BMI (Calculated) 33.29    Single Leg Stand 2.35 seconds              Nutrition Therapy Plan and Nutrition Goals:  Nutrition Therapy &  Goals - 07/24/21 1048       Nutrition Therapy   Diet Heart healthy, low Na, pulmonary MNT, T2DM    Protein (specify units) 110g    Fiber 25 grams    Whole Grain Foods 3 servings    Saturated Fats 12 max. grams    Fruits and Vegetables 8 servings/day    Sodium 1.5 grams      Personal Nutrition Goals   Nutrition Goal ST: work on adding things to her snacks (protein, fiber, or fat)  LT: Continue to keep A1C </= 7, meet protein needs, combine fiber/fat/protein    Comments PYP 61. Last A1C 7.2 on metformin. Had a cholecystectomy in 2009 and continues to eat lower amounts of fat. She likes salty and sweet foods and she feels that gets in the way of her eating healthy sometimes. B: protein shake: almond milk, strawberris, banana, protein powder  or oikos yogurt with granola L: 1/2 sandwich with fries or chips D: meat (chicken mostly) and salad or a chicken salad. She uses olive oil, butter, and sometimes bacon in her green beans. Doesn't eat many fried foods. Her weight is stable. She reports that if her husband and her were to go out to the movies they would split popcorn and  candy, discussed maybe having some popcorn and then bringing some peanuts - the extra fiber, fat, and protein with help to balance this snack/meal better so it is less carbohydrates and she has a better blood sugar response as well as increasing her protein intake. Discussed heart healthy/T2DM eating as well as pulmonary MNT.      Intervention Plan   Intervention Prescribe, educate and counsel regarding individualized specific dietary modifications aiming towards targeted core components such as weight, hypertension, lipid management, diabetes, heart failure and other comorbidities.    Expected Outcomes Short Term Goal: Understand basic principles of dietary content, such as calories, fat, sodium, cholesterol and nutrients.;Short Term Goal: A plan has been developed with personal nutrition goals set during dietitian appointment.;Long Term Goal: Adherence to prescribed nutrition plan.             Nutrition Assessments:  MEDIFICTS Score Key: ?70 Need to make dietary changes  40-70 Heart Healthy Diet ? 40 Therapeutic Level Cholesterol Diet  Flowsheet Row Pulmonary Rehab from 05/18/2021 in [facility]  Picture Your Plate Total Score on Admission 61      Picture Your Plate Scores: <57 Unhealthy dietary pattern with much room for improvement. 41-50 Dietary pattern unlikely to meet recommendations for good health and room for improvement. 51-60 More healthful dietary pattern, with some room for improvement.  >60 Healthy dietary pattern, although there may be some specific behaviors that could be improved.   Nutrition Goals Re-Evaluation:  Nutrition Goals Re-Evaluation     [place] Name 06/26/21 1045 07/10/21 1003 07/31/21 1023 09/04/21 1012 10/06/21 1018     Goals   Current Weight -- 201 lb (91.2 kg) -- -- 198 lb (89.8 kg)   Nutrition Goal -- meet with dietitian -- ST: work on adding things to her snacks (protein, fiber, or fat)  LT: Continue to keep A1C </= 7, meet  protein needs, combine fiber/fat/protein Eat less. Watch sugar and Salt intake.   Comment Patient has not yet met with program dietician. Patient has not yet met with program dietician. Megumi is stil working on dietary changes.  She says it is more difficult this time of year. Cynthis is doing well in rehab. She has  lost some weight and doing pretty well with her diet.  She is back on track now that the holidays behind her.  She continues to improve her diet and trying to find more balance again Patient was informed on why it is important to maintain a balanced diet when dealing with Respiratory issues. Explained that it takes a lot of energy to breath and when they are short of breath often they will need to have a good diet to help keep up with the calories they are expending for breathing.   Expected Outcome Short: meed with dieticain. Short: meed with dieticain. Long: adhere to a diet that pertains to patient. Short:  continue to make small changes Long: reach dietary goals as suggested by RD Short: Continue to adjust back to diet Long: Continue to try to get more variety into her diet. Short: Choose and plan snacks accordingly to patients caloric intake to improve breathing. Long: Maintain a diet independently that meets their caloric intake to aid in daily shortness of breath.            Nutrition Goals Discharge (Final Nutrition Goals Re-Evaluation):  Nutrition Goals Re-Evaluation - 10/06/21 1018       Goals   Current Weight 198 lb (89.8 kg)    Nutrition Goal Eat less. Watch sugar and Salt intake.    Comment Patient was informed on why it is important to maintain a balanced diet when dealing with Respiratory issues. Explained that it takes a lot of energy to breath and when they are short of breath often they will need to have a good diet to help keep up with the calories they are expending for breathing.    Expected Outcome Short: Choose and plan snacks accordingly to patients caloric  intake to improve breathing. Long: Maintain a diet independently that meets their caloric intake to aid in daily shortness of breath.             Psychosocial: Target Goals: Acknowledge presence or absence of significant depression and/or stress, maximize coping skills, provide positive support system. Participant is able to verbalize types and ability to use techniques and skills needed for reducing stress and depression.   Education: Stress, Anxiety, and Depression - Group verbal and visual presentation to define topics covered.  Reviews how body is impacted by stress, anxiety, and depression.  Also discusses healthy ways to reduce stress and to treat/manage anxiety and depression.  Written material given at graduation.   Education: Sleep Hygiene -Provides group verbal and written instruction about how sleep can affect your health.  Define sleep hygiene, discuss sleep cycles and impact of sleep habits. Review good sleep hygiene tips.    Initial Review & Psychosocial Screening:  Initial Psych Review & Screening - 05/04/21 1453       Initial Review   Current issues with None Identified      Family Dynamics   Good Support System? Yes   husband and children  2 nearby  one in [place]     Barriers   Psychosocial barriers to participate in program There are no identifiable barriers or psychosocial needs.      Screening Interventions   Interventions Encouraged to exercise;To provide support and resources with identified psychosocial needs;Provide feedback about the scores to participant    Expected Outcomes Short Term goal: Utilizing psychosocial counselor, staff and physician to assist with identification of specific Stressors or current issues interfering with healing process. Setting desired goal for each stressor or current issue identified.;Long  Term Goal: Stressors or current issues are controlled or eliminated.;Short Term goal: Identification and review with participant of any  Quality of Life or Depression concerns found by scoring the questionnaire.;Long Term goal: The participant improves quality of Life and PHQ9 Scores as seen by post scores and/or verbalization of changes             Quality of Life Scores:  Scores of 19 and below usually indicate a poorer quality of life in these areas.  A difference of  2-3 points is a clinically meaningful difference.  A difference of 2-3 points in the total score of the Quality of Life Index has been associated with significant improvement in overall quality of life, self-image, physical symptoms, and general health in studies assessing change in quality of life.  PHQ-9: Recent Review Flowsheet Data     Depression screen [facility] 2/9 10/02/2021 09/01/2021 07/17/2021 07/10/2021 06/26/2021   Decreased Interest 1 1 0 1 1   Down, Depressed, Hopeless 1 0 0 0 1   PHQ - 2 Score 2 1 0 1 2   Altered sleeping 2 1 1 3 1    Tired, decreased energy 2 2 2 2 2    Change in appetite 1 1 1 3 1    Feeling bad or failure about yourself  0 0 0 0 0   Trouble concentrating 0 2 0 0 1   Moving slowly or fidgety/restless 0 0 0 0 1   Suicidal thoughts 0 0 0 0 0   PHQ-9 Score 7 7 4 9 8    Difficult doing work/chores Somewhat difficult Not difficult at all Not difficult at all Not difficult at all Not difficult at all      Interpretation of Total Score  Total Score Depression Severity:  1-4 = Minimal depression, 5-9 = Mild depression, 10-14 = Moderate depression, 15-19 = Moderately severe depression, 20-27 = Severe depression   Psychosocial Evaluation and Intervention:  Psychosocial Evaluation - 05/04/21 1505       Psychosocial Evaluation & Interventions   Interventions Encouraged to exercise with the program and follow exercise prescription    Comments Nikki has no barriers to attending the program. She wants to gain back some of her staamina and improve her breathing. She lives with her husband of 75 years. She has him as part of her support,  as well as 3 children- 2 nearby and one in [place]. She had been in the program in 2018 and is ready to do what she can to acheive her goals.    Expected Outcomes STG: Graycee is able to attend all scheduled sessions. She is able to see improvment in her breathing and stamina. LTG: Maxwell continues with her exercise progress and symptom control    Continue Psychosocial Services  Follow up required by staff             Psychosocial Re-Evaluation:  Psychosocial Re-Evaluation     [place] Name 06/26/21 1046 07/10/21 1001 07/17/21 1033 07/31/21 1025 09/01/21 1039     Psychosocial Re-Evaluation   Current issues with -- -- -- Current Sleep Concerns Current Stress Concerns   Comments Patient reports no new stress or sleep concerns. She did a PHQ reevaluation with a score of 8, but attributes most of her difficulties to her physical condition, not to mental health concerns. Reviewed patient health questionnaire (PHQ-9) with patient for follow up. Previously, patients score indicated signs/symptoms of depression.  Reviewed to see if patient is improving symptom wise while in program.  Score declined and patient states that it is because she is not sleeping well and feels like she has little energy. Reviewed patient health questionnaire (PHQ-9) with patient for follow up. Previously, patients score indicated signs/symptoms of depression.  Reviewed to see if patient is improving symptom wise while in program.  Score declined and patient states that it is because she is not sleeping well and feels like she has little energy. Kaliegh is sleeping a little better now.  She does usually nap during the day and stay up later at night. Reviewed patient health questionnaire (PHQ-9) with patient for follow up. Previously, patients score indicated signs/symptoms of depression.  Reviewed to see if patient is improving symptom wise while in program.  Score declined and patient states that it is because her energy levels have  been lower.   Expected Outcomes Short: continue to attend pulmonary rehab consistently. Long: maintian good metal health habits. Short: Continue to work toward an improvement in [place] scores by attending [facility] regularly. Long: Continue to improve stress and depression coping skills by talking with staff and attending [facility] regularly and work toward a positive mental state. Short: Continue to work toward an improvement in [place] scores by attending [facility] regularly. Long: Continue to improve stress and depression coping skills by talking with staff and attending [facility] regularly and work toward a positive mental state. Short:   continue to work on sleep patterns Long: feel rested day to day Short: Continue to work toward an improvement in [place] scores by attending [facility] regularly. Long: Continue to improve stress and depression coping skills by talking with staff and attending [facility] regularly and work toward a positive mental state.   Interventions Encouraged to attend Pulmonary Rehabilitation for the exercise Encouraged to attend Pulmonary Rehabilitation for the exercise -- -- Encouraged to attend Pulmonary Rehabilitation for the exercise   Continue Psychosocial Services  Follow up required by staff Follow up required by staff -- -- Follow up required by staff    [place] Name 10/06/21 1024             Psychosocial Re-Evaluation   Current issues with Current Stress Concerns       Comments Reviewed patient health questionnaire (PHQ-9) with patient for follow up. Previously, patients score indicated signs/symptoms of depression.  Reviewed to see if patient is improving symptoms wise while in program.  Score stayed and patient states that it is because she has other things going on with her husbans health.       Expected Outcomes Short: Continue to work toward an improvement in [place] scores by attending [facility] regularly. Long: Continue to improve stress and depression  coping skills by talking with staff and attending [facility] regularly and work toward a positive mental state.       Interventions Encouraged to attend Pulmonary Rehabilitation for the exercise       Continue Psychosocial Services  Follow up required by staff                Psychosocial Discharge (Final Psychosocial Re-Evaluation):  Psychosocial Re-Evaluation - 10/06/21 1024       Psychosocial Re-Evaluation   Current issues with Current Stress Concerns    Comments Reviewed patient health questionnaire (PHQ-9) with patient for follow up. Previously, patients score indicated signs/symptoms of depression.  Reviewed to see if patient is improving symptoms wise while in program.  Score stayed and patient states that it is because she has other things going on with her husbans health.  Expected Outcomes Short: Continue to work toward an improvement in [place] scores by attending [facility] regularly. Long: Continue to improve stress and depression coping skills by talking with staff and attending [facility] regularly and work toward a positive mental state.    Interventions Encouraged to attend Pulmonary Rehabilitation for the exercise    Continue Psychosocial Services  Follow up required by staff             Education: Education Goals: Education classes will be provided on a weekly basis, covering required topics. Participant will state understanding/return demonstration of topics presented.  Learning Barriers/Preferences:  Learning Barriers/Preferences - 05/04/21 1456       Learning Barriers/Preferences   Learning Barriers None    Learning Preferences None             General Pulmonary Education Topics:  Infection Prevention: - Provides verbal and written material to individual with discussion of infection control including proper hand washing and proper equipment cleaning during exercise session. Flowsheet Row Pulmonary Rehab from 10/11/2021 in [facility] Cardiac and Pulmonary  Rehab  Date 05/18/21  Educator AS  Instruction Review Code 1- Verbalizes Understanding       Falls Prevention: - Provides verbal and written material to individual with discussion of falls prevention and safety. Flowsheet Row Pulmonary Rehab from 10/11/2021 in [facility] Cardiac and Pulmonary Rehab  Date 05/18/21  Educator AS  Instruction Review Code 1- Verbalizes Understanding       Chronic Lung Disease Review: - Group verbal instruction with posters, models, PowerPoint presentations and videos,  to review new updates, new respiratory medications, new advancements in procedures and treatments. Providing information on websites and "800" numbers for continued self-education. Includes information about supplement oxygen, available portable oxygen systems, continuous and intermittent flow rates, oxygen safety, concentrators, and Medicare reimbursement for oxygen. Explanation of Pulmonary Drugs, including class, frequency, complications, importance of spacers, rinsing mouth after steroid MDI's, and proper cleaning methods for nebulizers. Review of basic lung anatomy and physiology related to function, structure, and complications of lung disease. Review of risk factors. Discussion about methods for diagnosing sleep apnea and types of masks and machines for OSA. Includes a review of the use of types of environmental controls: home humidity, furnaces, filters, dust mite/pet prevention, HEPA vacuums. Discussion about weather changes, air quality and the benefits of nasal washing. Instruction on Warning signs, infection symptoms, calling MD promptly, preventive modes, and value of vaccinations. Review of effective airway clearance, coughing and/or vibration techniques. Emphasizing that all should Create an Action Plan. Written material given at graduation. Flowsheet Row Pulmonary Rehab from 10/11/2021 in [facility] Cardiac and Pulmonary Rehab  Date 05/31/21  Educator [facility]  Instruction Review Code 1- Verbalizes  Understanding       AED/CPR: - Group verbal and written instruction with the use of models to demonstrate the basic use of the AED with the basic ABC's of resuscitation.    Anatomy and Cardiac Procedures: - Group verbal and visual presentation and models provide information about basic cardiac anatomy and function. Reviews the testing methods done to diagnose heart disease and the outcomes of the test results. Describes the treatment choices: Medical Management, Angioplasty, or Coronary Bypass Surgery for treating various heart conditions including Myocardial Infarction, Angina, Valve Disease, and Cardiac Arrhythmias.  Written material given at graduation. Flowsheet Row Pulmonary Rehab from 10/11/2021 in [facility] Cardiac and Pulmonary Rehab  Date 08/30/21  Educator SB  Instruction Review Code 1- Verbalizes Understanding       Medication Safety: - Group  verbal and visual instruction to review commonly prescribed medications for heart and lung disease. Reviews the medication, class of the drug, and side effects. Includes the steps to properly store meds and maintain the prescription regimen.  Written material given at graduation.   Other: -Provides group and verbal instruction on various topics (see comments)   Knowledge Questionnaire Score:  Knowledge Questionnaire Score - 05/18/21 1555       Knowledge Questionnaire Score   Pre Score 15/18              Core Components/Risk Factors/Patient Goals at Admission:  Personal Goals and Risk Factors at Admission - 05/18/21 1600       Core Components/Risk Factors/Patient Goals on Admission    Weight Management Yes    Intervention Weight Management: Develop a combined nutrition and exercise program designed to reach desired caloric intake, while maintaining appropriate intake of nutrient and fiber, sodium and fats, and appropriate energy expenditure required for the weight goal.;Weight Management/Obesity: Establish reasonable short term  and long term weight goals.    Admit Weight 204 lb (92.5 kg)    Goal Weight: Short Term 200 lb (90.7 kg)    Goal Weight: Long Term 150 lb (68 kg)    Expected Outcomes Short Term: Continue to assess and modify interventions until short term weight is achieved;Long Term: Adherence to nutrition and physical activity/exercise program aimed toward attainment of established weight goal;Weight Loss: Understanding of general recommendations for a balanced deficit meal plan, which promotes 1-2 lb weight loss per week and includes a negative energy balance of [id number] kcal/d    Improve shortness of breath with ADL's Yes    Intervention Provide education, individualized exercise plan and daily activity instruction to help decrease symptoms of SOB with activities of daily living.    Expected Outcomes Short Term: Improve cardiorespiratory fitness to achieve a reduction of symptoms when performing ADLs;Long Term: Be able to perform more ADLs without symptoms or delay the onset of symptoms    Intervention Provide education, demonstration and support about specific breathing techniuqes utilized for more efficient breathing. Include techniques such as pursed lipped breathing, diaphragmatic breathing and self-pacing activity.    Expected Outcomes Short Term: Participant will be able to demonstrate and use breathing techniques as needed throughout daily activities.    Increase knowledge of respiratory medications and ability to use respiratory devices properly  Yes    Intervention Provide education and demonstration as needed of appropriate use of medications, inhalers, and oxygen therapy.    Expected Outcomes Short Term: Achieves understanding of medications use. Understands that oxygen is a medication prescribed by physician. Demonstrates appropriate use of inhaler and oxygen therapy.;Long Term: Maintain appropriate use of medications, inhalers, and oxygen therapy.    Diabetes Yes    Intervention Provide education  about signs/symptoms and action to take for hypo/hyperglycemia.;Provide education about proper nutrition, including hydration, and aerobic/resistive exercise prescription along with prescribed medications to achieve blood glucose in normal ranges: Fasting glucose 65-99 mg/dL    Expected Outcomes Short Term: Participant verbalizes understanding of the signs/symptoms and immediate care of hyper/hypoglycemia, proper foot care and importance of medication, aerobic/resistive exercise and nutrition plan for blood glucose control.;Long Term: Attainment of HbA1C < 7%.    Heart Failure Yes    Intervention Provide a combined exercise and nutrition program that is supplemented with education, support and counseling about heart failure. Directed toward relieving symptoms such as shortness of breath, decreased exercise tolerance, and extremity edema.    Expected Outcomes  Improve functional capacity of life;Short term: Attendance in program 2-3 days a week with increased exercise capacity. Reported lower sodium intake. Reported increased fruit and vegetable intake. Reports medication compliance.;Short term: Daily weights obtained and reported for increase. Utilizing diuretic protocols set by physician.;Long term: Adoption of self-care skills and reduction of barriers for early signs and symptoms recognition and intervention leading to self-care maintenance.    Hypertension Yes    Intervention Provide education on lifestyle modifcations including regular physical activity/exercise, weight management, moderate sodium restriction and increased consumption of fresh fruit, vegetables, and low fat dairy, alcohol moderation, and smoking cessation.;Monitor prescription use compliance.    Expected Outcomes Short Term: Continued assessment and intervention until BP is < 140/93m HG in hypertensive participants. < 130/872mHG in hypertensive participants with diabetes, heart failure or chronic kidney disease.;Long Term: Maintenance  of blood pressure at goal levels.    Lipids Yes    Intervention Provide education and support for participant on nutrition & aerobic/resistive exercise along with prescribed medications to achieve LDL <7084mHDL >65m56m  Expected Outcomes Short Term: Participant states understanding of desired cholesterol values and is compliant with medications prescribed. Participant is following exercise prescription and nutrition guidelines.;Long Term: Cholesterol controlled with medications as prescribed, with individualized exercise RX and with personalized nutrition plan. Value goals: LDL < 70mg21mL > 40 mg.             Education:Diabetes - Individual verbal and written instruction to review signs/symptoms of diabetes, desired ranges of glucose level fasting, after meals and with exercise. Acknowledge that pre and post exercise glucose checks will be done for 3 sessions at entry of program. Flowsheet Row [facility]monary [facility]  Date 05/18/21  Educator AS  Instruction Review Code 1- Verbalizes Understanding       Know Your Numbers and Heart Failure: - Group verbal and visual instruction to discuss disease risk factors for cardiac and pulmonary disease and treatment options.  Reviews associated critical values for Overweight/Obesity, Hypertension, Cholesterol, and Diabetes.  Discusses basics of heart failure: signs/symptoms and treatments.  Introduces Heart Failure Zone chart for action plan for heart failure.  Written material given at graduation.   Core Components/Risk Factors/Patient Goals Review:   Goals and Risk Factor Review     Row Name 06/26/21 1041 07/10/21 1004 07/31/21 1020 09/04/21 1014 10/06/21 1017     Core Components/Risk Factors/Patient Goals Review   Personal Goals Review Weight Management/Obesity;Develop more efficient breathing techniques such as purse lipped breathing and diaphragmatic breathing and practicing self-pacing with  activity.;Heart Failure;Increase knowledge of respiratory medications and ability to use respiratory devices properly.;Hypertension;Improve shortness of breath with ADL's;Diabetes;Lipids Improve shortness of breath with ADL's Improve shortness of breath with ADL's;Develop more efficient breathing techniques such as purse lipped breathing and diaphragmatic breathing and practicing self-pacing with activity. Improve shortness of breath with ADL's;Weight Management/Obesity;Hypertension;Heart Failure;Diabetes;Lipids;Increase knowledge of respiratory medications and ability to use respiratory devices properly. Improve shortness of breath with ADL's   Review Patient reports reports that she is taking all her medications. She stated that her Diabetes medication bothers her stomach, and since having COVID it has made it even worse. She is working on building back to her full dose but at this current time she is long able to tolerate a half dose. She is keeping a close eye on her blood sugar as she works back up to her full dose. Patient does not weigh every day and she was encouraged  to do so to keep an eye on weight and fluid levels. Spoke to patient about their shortness of breath and what they can do to improve. Patient has been informed of breathing techniques when starting the program. Patient is informed to tell staff if they have had any med changes and that certain meds they are taking or not taking can be causing shortness of breath. Marrie has not noticed any big changes in her breathing since starting to exercise.  She has had some stiffness from arthritis.  She does practice PLB at home Shewanda is doing well  in rehab. Her weight has gone down some and she hopes that it will continue to improve now that the holidays are over.  She does not check her sugars at home, but has not noticed any major swings.  Her pressures are getting better and she does check them on occasion at home. She has chronic swelling in  her left foot but she notes that it is lower than in the past.  She is doing well with her medications. Her metformin messes with her stomach with double dose so  she has halved her dose and symptoms are more manageble and her doctor is aware. Her breathing is doing fairly well.  She does not need her oxygen at home but will use it when she goes out and about. Spoke to patient about their shortness of breath and what they can do to improve. Patient has been informed of breathing techniques when starting the program. Patient is informed to tell staff if they have had any med changes and that certain meds they are taking or not taking can be causing shortness of breath.   Expected Outcomes Short: work back up to full dose of DM medications and communicate any concerns with doctor. Weight every day. Long: continue improvements with SOB and continue to manage disease with medical care team. Short: Attend [facility] regularly to improve shortness of breath with ADLs. Long: maintain independence with ADLs Short: continue to exercise to help with breathing Long:  beccome proficient at PLB Short: continue to work on breathing Long: continue to monitor risk factors. Short: Attend [facility] regularly to improve shortness of breath with ADLs. Long: maintain independence with ADLs            Core Components/Risk Factors/Patient Goals at Discharge (Final Review):   Goals and Risk Factor Review - 10/06/21 1017       Core Components/Risk Factors/Patient Goals Review   Personal Goals Review Improve shortness of breath with ADL's    Review Spoke to patient about their shortness of breath and what they can do to improve. Patient has been informed of breathing techniques when starting the program. Patient is informed to tell staff if they have had any med changes and that certain meds they are taking or not taking can be causing shortness of breath.    Expected Outcomes Short: Attend [facility] regularly to improve  shortness of breath with ADLs. Long: maintain independence with ADLs             ITP Comments:  ITP Comments     Row Name 05/04/21 1511 05/18/21 1606 05/29/21 0954 05/31/21 1429 06/19/21 1513   ITP Comments Virtual orientation call completed today. shehas an appointment on Date: 05/18/2021  for EP eval and gym Orientation.  Documentation of diagnosis can be found in [facility] Date:   02/07/2021 Completed 6MWT and gym orientation. Initial ITP created and sent for review to Dr. Ottie Glazier,  Medical Director. First full day of exercise!  Patient was oriented to gym and equipment including functions, settings, policies, and procedures.  Patient's individual exercise prescription and treatment plan were reviewed.  All starting workloads were established based on the results of the 6 minute walk test done at initial orientation visit.  The plan for exercise progression was also introduced and progression will be customized based on patient's performance and goals. 30 day review completed. ITP sent to Dr. Zetta Bills, Medical Director of  Pulmonary Rehab. Continue with ITP unless changes are made by physician. Pt is currently out with COVID.  She is due to return on 10/31    Row Name 06/28/21 0815 07/24/21 1153 07/26/21 0751 08/23/21 1121 09/20/21 0926   ITP Comments 30 Day review completed. Medical Director ITP review done, changes made as directed, and signed approval by Medical Director. Completed initial RD consultation 30 Day review completed. Medical Director ITP review done, changes made as directed, and signed approval by Medical Director. 30 Day review completed. Medical Director ITP review done, changes made as directed, and signed approval by Medical Director. 30 Day review completed. Medical Director ITP review done, changes made as directed, and signed approval by Medical Director.   3 visits since last review    Row Name 10/18/21 0924           ITP Comments 30 Day review completed.  Medical Director ITP review done, changes made as directed, and signed approval by Medical Director.                Comments:

## 2021-10-23 ENCOUNTER — Encounter: Payer: Medicare PPO | Admitting: *Deleted

## 2021-10-23 ENCOUNTER — Other Ambulatory Visit: Payer: Self-pay

## 2021-10-23 DIAGNOSIS — J984 Other disorders of lung: Secondary | ICD-10-CM | POA: Diagnosis not present

## 2021-10-23 NOTE — Progress Notes (Signed)
Daily Session Note  Patient Details  Name: Betty [name] MRN: [id number] Date of Birth: Jan 04, 1946 Referring Provider:   April Manson Pulmonary Rehab from 05/18/2021 in [facility] Cardiac and Pulmonary Rehab  Referring Provider [name]       Encounter Date: 10/23/2021  Check In:  Session Check In - 10/23/21 1017       Check-In   Supervising physician immediately available to respond to emergencies See telemetry face sheet for immediately available ER MD    Location [facility]-Cardiac & Pulmonary Rehab    Staff Present Renita Papa, RN Vickki Hearing, BA, ACSM CEP, Exercise Physiologist;Kelly Amedeo Plenty, BS, ACSM CEP, Exercise Physiologist    Virtual Visit No    Medication changes reported     No    Fall or balance concerns reported    No    Warm-up and Cool-down Performed on first and last piece of equipment    Resistance Training Performed Yes    VAD Patient? No    PAD/SET Patient? No      Pain Assessment   Currently in Pain? No/denies                Social History   Tobacco Use  Smoking Status Never  Smokeless Tobacco Never    Goals Met:  Independence with exercise equipment Exercise tolerated well No report of concerns or symptoms today Strength training completed today  Goals Unmet:  Not Applicable  Comments: Pt able to follow exercise prescription today without complaint.  Will continue to monitor for progression.    Dr. Emily Filbert is Medical Director for [place].  Dr. Ottie Glazier is Medical Director for [facility] Pulmonary Rehabilitation.

## 2021-10-25 ENCOUNTER — Other Ambulatory Visit: Payer: Self-pay

## 2021-10-25 ENCOUNTER — Encounter: Payer: Medicare PPO | Attending: Emergency Medicine

## 2021-10-25 DIAGNOSIS — J984 Other disorders of lung: Secondary | ICD-10-CM | POA: Diagnosis not present

## 2021-10-25 NOTE — Progress Notes (Signed)
Daily Session Note ? ?Patient Details  ?Name: Betty [name] ?MRN: [id number] ?Date of Birth: 21-Dec-1945 ?Referring Provider:   ?Flowsheet Row Pulmonary Rehab from 05/18/2021 in [facility] Cardiac and Pulmonary Rehab  ?Referring Provider [name]  ? ?  ? ? ?Encounter Date: 10/25/2021 ? ?Check In: ? Session Check In - 10/25/21 0956   ? ?  ? Check-In  ? Supervising physician immediately available to respond to emergencies See telemetry face sheet for immediately available ER MD   ? Location [facility]-Cardiac & Pulmonary Rehab   ? Staff Present [name], MPA, RN;Joseph [place], RCP,RRT,BSRT;Amanda Sommer, BA, ACSM CEP, Exercise Physiologist   ? Virtual Visit No   ? Medication changes reported     No   ? Fall or balance concerns reported    No   ? Warm-up and Cool-down Performed on first and last piece of equipment   ? Resistance Training Performed Yes   ? VAD Patient? No   ? PAD/SET Patient? No   ?  ? Pain Assessment  ? Currently in Pain? No/denies   ? ?  ?  ? ?  ? ? ? ? ? ?Social History  ? ?Tobacco Use  ?Smoking Status Never  ?Smokeless Tobacco Never  ? ? ?Goals Met:  ?Independence with exercise equipment ?Exercise tolerated well ?No report of concerns or symptoms today ?Strength training completed today ? ?Goals Unmet:  ?Not Applicable ? ?Comments: Pt able to follow exercise prescription today without complaint.  Will continue to monitor for progression. ? ? ? ?Dr. Emily Filbert is Medical Director for [place].  ?Dr. Ottie Glazier is Medical Director for [facility] Pulmonary Rehabilitation. ?

## 2021-10-27 ENCOUNTER — Encounter: Payer: Medicare PPO | Admitting: *Deleted

## 2021-10-27 ENCOUNTER — Other Ambulatory Visit: Payer: Self-pay

## 2021-10-27 VITALS — Ht 65.5 in | Wt 197.6 lb

## 2021-10-27 DIAGNOSIS — J984 Other disorders of lung: Secondary | ICD-10-CM | POA: Diagnosis not present

## 2021-10-27 NOTE — Progress Notes (Signed)
Daily Session Note ? ?Patient Details  ?Name: Betty [name] ?MRN: [id number] ?Date of Birth: 1945-11-29 ?Referring Provider:   ?Flowsheet Row Pulmonary Rehab from 05/18/2021 in [facility] Cardiac and Pulmonary Rehab  ?Referring Provider [name]  ? ?  ? ? ?Encounter Date: 10/27/2021 ? ?Check In: ? Session Check In - 10/27/21 0955   ? ?  ? Check-In  ? Supervising physician immediately available to respond to emergencies See telemetry face sheet for immediately available ER MD   ? Location [facility]-Cardiac & Pulmonary Rehab   ? Staff Present Renita Papa, RN BSN;Joseph [place], RCP,RRT,BSRT;Jessica [place], [place], [place], [place], CCET   ? Virtual Visit No   ? Medication changes reported     No   ? Fall or balance concerns reported    No   ? Warm-up and Cool-down Performed on first and last piece of equipment   ? Resistance Training Performed Yes   ? VAD Patient? No   ? PAD/SET Patient? No   ?  ? Pain Assessment  ? Currently in Pain? No/denies   ? ?  ?  ? ?  ? ? ? ? ? ?Social History  ? ?Tobacco Use  ?Smoking Status Never  ?Smokeless Tobacco Never  ? ? ?Goals Met:  ?Independence with exercise equipment ?Exercise tolerated well ?No report of concerns or symptoms today ?Strength training completed today ? ?Goals Unmet:  ?Not Applicable ? ?Comments: Pt able to follow exercise prescription today without complaint.  Will continue to monitor for progression. ? ? 6 Minute Walk   ? ? [place] Name 05/18/21 1540 10/27/21 1024  ?  ?  ? 6 Minute Walk  ? Phase Initial Discharge   ? Distance 1045 feet 1195 feet   ? Distance % Change -- 14.35 %   ? Distance Feet Change -- 150 ft   ? Walk Time 6 minutes 6 minutes   ? # of Rest Breaks 0 0   ? MPH 1.98 2.26   ? METS 2.14 2.3   ? RPE 11 13   ? Perceived Dyspnea  2 2   ? VO2 Peak 7.5 8.05   ? Symptoms No No   ? Resting HR 90 bpm 86 bpm   ? Resting BP 118/68 132/64   ? Resting Oxygen Saturation  95 % 98 %   ? Exercise Oxygen Saturation  during 6 min walk 89 % 94 %   ? Max Ex. HR 120 bpm 108 bpm   ? Max Ex. BP  138/84 134/72   ? 2 Minute Post BP 126/78 130/70   ?  ? Interval HR  ? 1 Minute HR 107 108   ? 2 Minute HR 73 99   ? 3 Minute HR 107 77   ? 4 Minute HR 117 62   ? 5 Minute HR 119 71   ? 6 Minute HR 120 92   ? 2 Minute Post HR 100 102   ? Interval Heart Rate? Yes Yes   ?  ? Interval Oxygen  ? Interval Oxygen? Yes Yes   ? Baseline Oxygen Saturation % 95 % 98 %   ? 1 Minute Oxygen Saturation % 98 % 98 %   ? 1 Minute Liters of Oxygen 3 L 3 L  continuous   ? 2 Minute Oxygen Saturation % 95 % 97 %   ? 2 Minute Liters of Oxygen 3 L 3 L   ? 3 Minute Oxygen Saturation % 91 % 96 %   ?  3 Minute Liters of Oxygen 3 L 3 L   ? 4 Minute Oxygen Saturation % 92 % 95 %   ? 4 Minute Liters of Oxygen 3 L 3 L   ? 5 Minute Oxygen Saturation % 89 % 94 %   ? 5 Minute Liters of Oxygen 3 L 3 L   ? 6 Minute Oxygen Saturation % 89 % 94 %   ? 6 Minute Liters of Oxygen 3 L 3 L   ? 2 Minute Post Oxygen Saturation % 98 % 97 %   ? 2 Minute Post Liters of Oxygen 3 L 3 L   ? ?  ?  ? ?  ? ? ? ?Dr. Emily Filbert is Medical Director for [place].  ?Dr. Ottie Glazier is Medical Director for [facility] Pulmonary Rehabilitation. ?

## 2021-10-30 ENCOUNTER — Encounter: Payer: Medicare PPO | Admitting: *Deleted

## 2021-10-30 ENCOUNTER — Other Ambulatory Visit: Payer: Self-pay

## 2021-10-30 DIAGNOSIS — J984 Other disorders of lung: Secondary | ICD-10-CM | POA: Diagnosis not present

## 2021-10-30 NOTE — Progress Notes (Signed)
Daily Session Note ? ?Patient Details  ?Name: Betty [name] ?MRN: [id number] ?Date of Birth: [birth year] ?Referring Provider:   ?Flowsheet Row Pulmonary Rehab from 05/18/2021 in [facility] Cardiac and Pulmonary Rehab  ?Referring Provider [name]  ? ?  ? ? ?Encounter Date: 10/30/2021 ? ?Check In: ? Session Check In - 10/30/21 1007   ? ?  ? Check-In  ? Supervising physician immediately available to respond to emergencies See telemetry face sheet for immediately available ER MD   ? Location [facility]-Cardiac & Pulmonary Rehab   ? Staff Present Renita Papa, RN Moises Blood, BS, ACSM CEP, Exercise Physiologist;Jessica Luan [place], MA, RCEP, CCRP, CCET   ? Virtual Visit No   ? Medication changes reported     No   ? Fall or balance concerns reported    No   ? Warm-up and Cool-down Performed on first and last piece of equipment   ? Resistance Training Performed Yes   ? VAD Patient? No   ? PAD/SET Patient? No   ?  ? Pain Assessment  ? Currently in Pain? No/denies   ? ?  ?  ? ?  ? ? ? ? ? ?Social History  ? ?Tobacco Use  ?Smoking Status Never  ?Smokeless Tobacco Never  ? ? ?Goals Met:  ?Independence with exercise equipment ?Exercise tolerated well ?No report of concerns or symptoms today ?Strength training completed today ? ?Goals Unmet:  ?Not Applicable ? ?Comments: Pt able to follow exercise prescription today without complaint.  Will continue to monitor for progression. ? ? ? ?Dr. Emily Filbert is Medical Director for [place].  ?Dr. Ottie Glazier is Medical Director for [facility] Pulmonary Rehabilitation. ?

## 2021-11-01 ENCOUNTER — Other Ambulatory Visit: Payer: Self-pay

## 2021-11-01 DIAGNOSIS — J984 Other disorders of lung: Secondary | ICD-10-CM

## 2021-11-01 DIAGNOSIS — J9610 Chronic respiratory failure, unspecified whether with hypoxia or hypercapnia: Secondary | ICD-10-CM | POA: Diagnosis not present

## 2021-11-01 NOTE — Progress Notes (Signed)
Daily Session Note ? ?Patient Details  ?Name: CATHLENE GARDELLA ?MRN: [id number] ?Date of Birth: 08/14/1946 ?Referring Provider:   ?Flowsheet Row Pulmonary Rehab from 05/18/2021 in [facility] Cardiac and Pulmonary Rehab  ?Referring Provider [name]  ? ?  ? ? ?Encounter Date: 11/01/2021 ? ?Check In: ? Session Check In - 11/01/21 1040   ? ?  ? Check-In  ? Supervising physician immediately available to respond to emergencies See telemetry face sheet for immediately available ER MD   ? Location [facility]-Cardiac & Pulmonary Rehab   ? Staff Present [name], MPA, RN;Joseph [place], RCP,RRT,BSRT;Jessica [place], MA, RCEP, CCRP, CCET   ? Virtual Visit No   ? Medication changes reported     No   ? Fall or balance concerns reported    No   ? Warm-up and Cool-down Performed on first and last piece of equipment   ? Resistance Training Performed Yes   ? VAD Patient? No   ? PAD/SET Patient? No   ?  ? Pain Assessment  ? Currently in Pain? No/denies   ? ?  ?  ? ?  ? ? ? ? ? ?Social History  ? ?Tobacco Use  ?Smoking Status Never  ?Smokeless Tobacco Never  ? ? ?Goals Met:  ?Independence with exercise equipment ?Exercise tolerated well ?No report of concerns or symptoms today ?Strength training completed today ? ?Goals Unmet:  ?Not Applicable ? ?Comments: Pt able to follow exercise prescription today without complaint.  Will continue to monitor for progression. ? ? ? ?Dr. Emily Filbert is Medical Director for [place].  ?Dr. Ottie Glazier is Medical Director for [facility] Pulmonary Rehabilitation. ?

## 2021-11-02 NOTE — Patient Instructions (Signed)
Discharge Patient Instructions ? ?Patient Details  ?Name: Betty [name] ?MRN: [id number] ?Date of Birth: Jul 04, 1946 ?Referring Provider:  Collene Gobble, MD ? ? ?Number of Visits: 30 ? ?Reason for Discharge:  ?Patient reached a stable level of exercise. ?Patient independent in their exercise. ?Patient has met program and personal goals. ? ?Smoking History:  ?Social History  ? ?Tobacco Use  ?Smoking Status Never  ?Smokeless Tobacco Never  ? ? ?Diagnosis:  ?Restrictive lung disease ? ?Initial Exercise Prescription: ? Initial Exercise Prescription - 05/18/21 1500   ? ?  ? Date of Initial Exercise RX and Referring Provider  ? Date 05/18/21   ? Referring Provider [name]   ?  ? Oxygen  ? Oxygen Continuous   ? Liters 3   ? Maintain Oxygen Saturation 88% or higher   ?  ? Treadmill  ? MPH 1.5   ? Grade 0   ? Minutes 15   ? METs 2.15   ?  ? NuStep  ? Level 1   ? SPM 80   ? Minutes 15   ? METs 2   ?  ? REL-XR  ? Level 1   ? Speed 50   ? Minutes 15   ? METs 2   ?  ? T5 Nustep  ? Level 1   ? SPM 80   ? Minutes 15   ? METs 2   ?  ? Track  ? Laps 20   ? Minutes 15   ? METs 2   ?  ? Prescription Details  ? Frequency (times per week) 3   ? Duration Progress to 30 minutes of continuous aerobic without signs/symptoms of physical distress   ?  ? Intensity  ? THRR 40-80% of Max Heartrate 112-135   ? Ratings of Perceived Exertion 11-13   ? Perceived Dyspnea 0-4   ?  ? Resistance Training  ? Training Prescription Yes   ? Weight 3 lb   ? Reps 10-15   ? ?  ?  ? ?  ? ? ?Discharge Exercise Prescription (Final Exercise Prescription Changes): ? Exercise Prescription Changes - 10/24/21 0800   ? ?  ? Response to Exercise  ? Blood Pressure (Admit) 112/74   ? Blood Pressure (Exit) 104/62   ? Heart Rate (Admit) 100 bpm   ? Heart Rate (Exercise) 114 bpm   ? Heart Rate (Exit) 108 bpm   ? Oxygen Saturation (Admit) 96 %   ? Oxygen Saturation (Exercise) 94 %   ? Oxygen Saturation (Exit) 96 %   ? Rating of Perceived Exertion (Exercise) 11   ? Perceived  Dyspnea (Exercise) 2   ? Symptoms SOB   ? Duration Continue with 30 min of aerobic exercise without signs/symptoms of physical distress.   ? Intensity THRR unchanged   ?  ? Progression  ? Progression Continue to progress workloads to maintain intensity without signs/symptoms of physical distress.   ? Average METs 2.5   ?  ? Resistance Training  ? Training Prescription Yes   ? Weight 3 lb   ? Reps 10-15   ?  ? Interval Training  ? Interval Training No   ?  ? Oxygen  ? Oxygen Continuous   ? Liters 3   ?  ? Treadmill  ? MPH 2.4   ? Grade 0   ? Minutes 15   ? METs 2.84   ?  ? T5 Nustep  ? Level 3   ? Minutes 15   ?  ?  Home Exercise Plan  ? Plans to continue exercise at Home (comment)   ? Frequency Add 2 additional days to program exercise sessions.   ? Initial Home Exercises Provided 07/31/21   ?  ? Oxygen  ? Maintain Oxygen Saturation 88% or higher   ? ?  ?  ? ?  ? ? ?Functional Capacity: ? 6 Minute Walk   ? ? [place] Name 05/18/21 1540 10/27/21 1024  ?  ?  ? 6 Minute Walk  ? Phase Initial Discharge   ? Distance 1045 feet 1195 feet   ? Distance % Change -- 14.35 %   ? Distance Feet Change -- 150 ft   ? Walk Time 6 minutes 6 minutes   ? # of Rest Breaks 0 0   ? MPH 1.98 2.26   ? METS 2.14 2.3   ? RPE 11 13   ? Perceived Dyspnea  2 2   ? VO2 Peak 7.5 8.05   ? Symptoms No No   ? Resting HR 90 bpm 86 bpm   ? Resting BP 118/68 132/64   ? Resting Oxygen Saturation  95 % 98 %   ? Exercise Oxygen Saturation  during 6 min walk 89 % 94 %   ? Max Ex. HR 120 bpm 108 bpm   ? Max Ex. BP 138/84 134/72   ? 2 Minute Post BP 126/78 130/70   ?  ? Interval HR  ? 1 Minute HR 107 108   ? 2 Minute HR 73 99   ? 3 Minute HR 107 77   ? 4 Minute HR 117 62   ? 5 Minute HR 119 71   ? 6 Minute HR 120 92   ? 2 Minute Post HR 100 102   ? Interval Heart Rate? Yes Yes   ?  ? Interval Oxygen  ? Interval Oxygen? Yes Yes   ? Baseline Oxygen Saturation % 95 % 98 %   ? 1 Minute Oxygen Saturation % 98 % 98 %   ? 1 Minute Liters of Oxygen 3 L 3 L  continuous   ?  2 Minute Oxygen Saturation % 95 % 97 %   ? 2 Minute Liters of Oxygen 3 L 3 L   ? 3 Minute Oxygen Saturation % 91 % 96 %   ? 3 Minute Liters of Oxygen 3 L 3 L   ? 4 Minute Oxygen Saturation % 92 % 95 %   ? 4 Minute Liters of Oxygen 3 L 3 L   ? 5 Minute Oxygen Saturation % 89 % 94 %   ? 5 Minute Liters of Oxygen 3 L 3 L   ? 6 Minute Oxygen Saturation % 89 % 94 %   ? 6 Minute Liters of Oxygen 3 L 3 L   ? 2 Minute Post Oxygen Saturation % 98 % 97 %   ? 2 Minute Post Liters of Oxygen 3 L 3 L   ? ?  ?  ? ?  ? ? ? ?Nutrition & Weight - Outcomes: ? Pre Biometrics - 05/18/21 1552   ? ?  ? Pre Biometrics  ? Height 5' 5.5" (1.664 m)   ? Weight 203 lb 3.2 oz (92.2 kg)   ? BMI (Calculated) 33.29   ? Single Leg Stand 2.35 seconds   ? ?  ?  ? ?  ? ? Post Biometrics - 10/27/21 1026   ? ?  ?  Post  Biometrics  ?  Height 5' 5.5" (1.664 m)   ? Weight 197 lb 9.6 oz (89.6 kg)   ? BMI (Calculated) 32.37   ? Single Leg Stand 4.5 seconds   ? ?  ?  ? ?  ? ? ?Nutrition: ? Nutrition Therapy & Goals - 07/24/21 1048   ? ?  ? Nutrition Therapy  ? Diet Heart healthy, low Na, pulmonary MNT, T2DM   ? Protein (specify units) 110g   ? Fiber 25 grams   ? Whole Grain Foods 3 servings   ? Saturated Fats 12 max. grams   ? Fruits and Vegetables 8 servings/day   ? Sodium 1.5 grams   ?  ? Personal Nutrition Goals  ? Nutrition Goal ST: work on adding things to her snacks (protein, fiber, or fat)  LT: Continue to keep A1C </= 7, meet protein needs, combine fiber/fat/protein   ? Comments PYP 61. Last A1C 7.2 on metformin. Had a cholecystectomy in 2009 and continues to eat lower amounts of fat. She likes salty and sweet foods and she feels that gets in the way of her eating healthy sometimes. B: protein shake: almond milk, strawberris, banana, protein powder  or oikos yogurt with granola L: 1/2 sandwich with fries or chips D: meat (chicken mostly) and salad or a chicken salad. She uses olive oil, butter, and sometimes bacon in her green beans. Doesn't eat many  fried foods. Her weight is stable. She reports that if her husband and her were to go out to the movies they would split popcorn and candy, discussed maybe having some popcorn and then bringing some peanuts - the extra fiber, fat, and protein with help to balance this snack/meal better so it is less carbohydrates and she has a better blood sugar response as well as increasing her protein intake. Discussed heart healthy/T2DM eating as well as pulmonary MNT.   ?  ? Intervention Plan  ? Intervention Prescribe, educate and counsel regarding individualized specific dietary modifications aiming towards targeted core components such as weight, hypertension, lipid management, diabetes, heart failure and other comorbidities.   ? Expected Outcomes Short Term Goal: Understand basic principles of dietary content, such as calories, fat, sodium, cholesterol and nutrients.;Short Term Goal: A plan has been developed with personal nutrition goals set during dietitian appointment.;Long Term Goal: Adherence to prescribed nutrition plan.   ? ?  ?  ? ?  ? ? ? ?Goals reviewed with patient; copy given to patient. ?

## 2021-11-03 ENCOUNTER — Other Ambulatory Visit: Payer: Self-pay

## 2021-11-03 ENCOUNTER — Encounter: Payer: Medicare PPO | Admitting: *Deleted

## 2021-11-03 DIAGNOSIS — J984 Other disorders of lung: Secondary | ICD-10-CM

## 2021-11-03 NOTE — Progress Notes (Signed)
Pulmonary Individual Treatment Plan  Patient Details  Name: Betty [name] MRN: [id number] Date of Birth: 1945-12-07 Referring Provider:   April Manson Pulmonary Rehab from 05/18/2021 in [facility] Cardiac and Pulmonary Rehab  Referring Provider [name]       Initial Encounter Date:  Flowsheet Row Pulmonary Rehab from 05/18/2021 in [facility] Cardiac and Pulmonary Rehab  Date 05/18/21       Visit Diagnosis: Restrictive lung disease  Patient's Home Medications on Admission:  Current Outpatient Medications:    acetic acid 2 % otic solution, Insert saturated wick of cotton; keep moist 24 hours by adding 3 to 5 drops every 4 to 6 hours; remove wick after 24 hours and instill 5 drops 3 to 4 times daily, Disp: 15 mL, Rfl: 0   albuterol (VENTOLIN HFA) 108 (90 Base) MCG/ACT inhaler, Inhale 2 puffs into the lungs every 4 (four) hours as needed for wheezing or shortness of breath., Disp: 8 g, Rfl: 6   aspirin 81 MG EC tablet, Take 1 tablet (81 mg total) by mouth daily., Disp: 90 tablet, Rfl: 2   CALCIUM-MAGNESIUM-VITAMIN D PO, Take 1 tablet by mouth daily., Disp: , Rfl:    carvedilol (COREG) 12.5 MG tablet, TAKE 1 TABLET BY MOUTH TWICE A DAY WITH MEALS, Disp: 60 tablet, Rfl: 11   clobetasol (TEMOVATE) 0.05 % GEL, Apply topically 2 (two) times daily., Disp: , Rfl:    Coenzyme Q10 (CO Q 10 PO), Take 200 mg by mouth at bedtime. , Disp: , Rfl:    Colchicine (MITIGARE) 0.6 MG CAPS, Day 1: take 1.2 mg (2 tablets) by mouth at the first sign of flare, followed by 0.6 mg (1 tablet) by mouth after 1 hour. Day 2 and thereafter: take 0.6 mg (1 tablet) by mouth daily until flare has resolved., Disp: 30 capsule, Rfl: 0   Fish Oil OIL, Take 1,200 mg by mouth 2 (two) times daily. GUMMIES, Disp: , Rfl:    fluticasone (FLONASE) 50 MCG/ACT nasal spray, Place 2 sprays into both nostrils daily., Disp: 16 g, Rfl: 6   losartan (COZAAR) 50 MG tablet, TAKE 1/2 TABLET BY MOUTH DAILY, Disp: 45 tablet, Rfl: 3   metFORMIN (GLUCOPHAGE  XR) 500 MG 24 hr tablet, Take 2 tablets (1,000 mg total) by mouth 2 (two) times daily with a meal. (Patient taking differently: Take 500 mg by mouth 2 (two) times daily with a meal.), Disp: 360 tablet, Rfl: 1   multivitamin (THERAGRAN) per tablet, Take 1 tablet by mouth daily., Disp: , Rfl:    nystatin cream (MYCOSTATIN), Apply 1 application topically 2 (two) times daily., Disp: 30 g, Rfl: 0   polyvinyl alcohol (LIQUIFILM TEARS) 1.4 % ophthalmic solution, Place 1 drop into both eyes every morning., Disp: , Rfl:    pravastatin (PRAVACHOL) 20 MG tablet, TAKE 1 TABLET BY MOUTH EVERY DAY, Disp: 90 tablet, Rfl: 1  Past Medical History: Past Medical History:  Diagnosis Date   Allergic rhinitis    never tested, fall and spring   Arthritis    hands,knees, feet   Cataracts, bilateral    not surgical yet   CHOLECYSTECTOMY, HX OF 10/08/2007   Qualifier: Diagnosis of  By: Council Mechanic MD, [name]    Chronic diastolic CHF (congestive heart failure) (HCC)    Chronic respiratory failure ([place])    Diabetes mellitus without complication ([place])    Diverticulosis    problems with frequent gas, burping   Glucose intolerance (impaired glucose tolerance)    Gout  History of chicken pox    Hyperlipidemia    Hypertension    NICM (nonischemic cardiomyopathy) ([place]) 2002   EF 25%; improved to normal - echo 4/08: EF 50-55%, mild MR, mild LAE, mild TR;    cath 3/03: normal cors, EF 40%   Obesity    Oxygen deficiency 2017   at night   PONV (postoperative nausea and vomiting)    after wisdom tooth extraction   Restrictive lung disease    Skin cancer    skin cancers only   Sleep apnea    cpap settings 4    Tobacco Use: Social History   Tobacco Use  Smoking Status Never  Smokeless Tobacco Never    Labs: Recent Review Flowsheet Data     Labs for ITP Cardiac and Pulmonary Rehab Latest Ref Rng & Units 11/14/2020 12/26/2020 02/21/2021 04/11/2021 05/17/2021   Cholestrol 0 - 200 mg/dL - - - - -   LDLCALC 0 -  99 mg/dL - - - - -   LDLDIRECT mg/dL 114.0 86.0 79.0 63.0 -   HDL >39.00 mg/dL - - - - -   Trlycerides 0.0 - 149.0 mg/dL - - - - -   Hemoglobin A1c 4.6 - 6.5 % 6.9(H) - - - 7.2(H)   HCO3 20.0 - 24.0 mEq/L - - - - -   TCO2 0 - 100 mmol/L - - - - -   O2SAT % - - - - -        Pulmonary Assessment Scores:  Pulmonary Assessment Scores     Row Name 05/18/21 1557 10/30/21 1009       ADL UCSD   ADL Phase Entry Exit    SOB Score total 51 68    Rest 0 1    Walk 2 2    Stairs 2 4    Bath 1 2    Dress 1 2    Shop 3 3      CAT Score   CAT Score 15 16      mMRC Score   mMRC Score 2 --             UCSD: Self-administered rating of dyspnea associated with activities of daily living (ADLs) 6-point scale (0 = "not at all" to 5 = "maximal or unable to do because of breathlessness")  Scoring Scores range from 0 to 120.  Minimally important difference is 5 units  CAT: CAT can identify the health impairment of COPD patients and is better correlated with disease progression.  CAT has a scoring range of zero to 40. The CAT score is classified into four groups of low (less than 10), medium (10 - 20), high (21-30) and very high (31-40) based on the impact level of disease on health status. A CAT score over 10 suggests significant symptoms.  A worsening CAT score could be explained by an exacerbation, poor medication adherence, poor inhaler technique, or progression of COPD or comorbid conditions.  CAT MCID is 2 points  mMRC: mMRC (Modified Medical Research Council) Dyspnea Scale is used to assess the degree of baseline functional disability in patients of respiratory disease due to dyspnea. No minimal important difference is established. A decrease in score of 1 point or greater is considered a positive change.   Pulmonary Function Assessment:   Exercise Target Goals: Exercise Program Goal: Individual exercise prescription set using results from initial 6 min walk test and THRR while  considering  patients activity barriers and safety.   Exercise  Prescription Goal: Initial exercise prescription builds to 30-45 minutes a day of aerobic activity, 2-3 days per week.  Home exercise guidelines will be given to patient during program as part of exercise prescription that the participant will acknowledge.  Education: Aerobic Exercise: - Group verbal and visual presentation on the components of exercise prescription. Introduces F.I.T.T principle from ACSM for exercise prescriptions.  Reviews F.I.T.T. principles of aerobic exercise including progression. Written material given at graduation.   Education: Resistance Exercise: - Group verbal and visual presentation on the components of exercise prescription. Introduces F.I.T.T principle from ACSM for exercise prescriptions  Reviews F.I.T.T. principles of resistance exercise including progression. Written material given at graduation. Flowsheet Row Pulmonary Rehab from 10/11/2021 in [facility] Cardiac and Pulmonary Rehab  Date 08/30/21  Educator [facility]  Instruction Review Code 1- Verbalizes Understanding        Education: Exercise & Equipment Safety: - Individual verbal instruction and demonstration of equipment use and safety with use of the equipment. Flowsheet Row Pulmonary Rehab from 10/11/2021 in [facility] Cardiac and Pulmonary Rehab  Date 05/18/21  Educator AS  Instruction Review Code 1- Verbalizes Understanding       Education: Exercise Physiology & General Exercise Guidelines: - Group verbal and written instruction with models to review the exercise physiology of the cardiovascular system and associated critical values. Provides general exercise guidelines with specific guidelines to those with heart or lung disease.    Education: Flexibility, Balance, Mind/Body Relaxation: - Group verbal and visual presentation with interactive activity on the components of exercise prescription. Introduces F.I.T.T principle from ACSM for exercise  prescriptions. Reviews F.I.T.T. principles of flexibility and balance exercise training including progression. Also discusses the mind body connection.  Reviews various relaxation techniques to help reduce and manage stress (i.e. Deep breathing, progressive muscle relaxation, and visualization). Balance handout provided to take home. Written material given at graduation.   Activity Barriers & Risk Stratification:   6 Minute Walk:  6 Minute Walk     Row Name 05/18/21 1540 10/27/21 1024       6 Minute Walk   Phase Initial Discharge    Distance 1045 feet 1195 feet    Distance % Change -- 14.35 %    Distance Feet Change -- 150 ft    Walk Time 6 minutes 6 minutes    # of Rest Breaks 0 0    MPH 1.98 2.26    METS 2.14 2.3    RPE 11 13    Perceived Dyspnea  2 2    VO2 Peak 7.5 8.05    Symptoms No No    Resting HR 90 bpm 86 bpm    Resting BP 118/68 132/64    Resting Oxygen Saturation  95 % 98 %    Exercise Oxygen Saturation  during 6 min walk 89 % 94 %    Max Ex. HR 120 bpm 108 bpm    Max Ex. BP 138/84 134/72    2 Minute Post BP 126/78 130/70      Interval HR   1 Minute HR 107 108    2 Minute HR 73 99    3 Minute HR 107 77    4 Minute HR 117 62    5 Minute HR 119 71    6 Minute HR 120 92    2 Minute Post HR 100 102    Interval Heart Rate? Yes Yes      Interval Oxygen   Interval Oxygen? Yes Yes  Baseline Oxygen Saturation % 95 % 98 %    1 Minute Oxygen Saturation % 98 % 98 %    1 Minute Liters of Oxygen 3 L 3 L  continuous    2 Minute Oxygen Saturation % 95 % 97 %    2 Minute Liters of Oxygen 3 L 3 L    3 Minute Oxygen Saturation % 91 % 96 %    3 Minute Liters of Oxygen 3 L 3 L    4 Minute Oxygen Saturation % 92 % 95 %    4 Minute Liters of Oxygen 3 L 3 L    5 Minute Oxygen Saturation % 89 % 94 %    5 Minute Liters of Oxygen 3 L 3 L    6 Minute Oxygen Saturation % 89 % 94 %    6 Minute Liters of Oxygen 3 L 3 L    2 Minute Post Oxygen Saturation % 98 % 97 %    2  Minute Post Liters of Oxygen 3 L 3 L            Oxygen Initial Assessment:   Oxygen Re-Evaluation:  Oxygen Re-Evaluation     Row Name 05/29/21 0955 06/26/21 1051 07/10/21 1005 07/31/21 1027 09/04/21 1019     Program Oxygen Prescription   Program Oxygen Prescription E-Tanks E-Tanks E-Tanks -- E-Tanks   Liters per minute 3 3 3 3 3    Comments as needed for oxygen saturation below 88% as needed for oxygen saturation below 88% -- as needed for oxygen saturation below 88% as needed for oxygen saturation below 88%     Home Oxygen   Home Oxygen Device -- -- Home Concentrator;Portable Concentrator Home Concentrator;Portable Concentrator Home Concentrator;Portable Concentrator   Sleep Oxygen Prescription CPAP;Continuous CPAP;Continuous CPAP;Continuous CPAP;Continuous CPAP;Continuous   Liters per minute 3 3 3 3 3    Home Exercise Oxygen Prescription Continuous Continuous Continuous Continuous Continuous   Liters per minute 3 3 3 3 3    Home Resting Oxygen Prescription Continuous Continuous None None None   Liters per minute 3 3 3 3  --   Compliance with Home Oxygen Use Yes Yes  Complient with night time use. Uses O2 as needed during the day, but not at all times. Yes Yes Yes     Goals/Expected Outcomes   Short Term Goals To learn and exhibit compliance with exercise, home and travel O2 prescription;To learn and understand importance of monitoring SPO2 with pulse oximeter and demonstrate accurate use of the pulse oximeter.;To learn and understand importance of maintaining oxygen saturations>88%;To learn and demonstrate proper pursed lip breathing techniques or other breathing techniques.  To learn and exhibit compliance with exercise, home and travel O2 prescription;To learn and understand importance of monitoring SPO2 with pulse oximeter and demonstrate accurate use of the pulse oximeter.;To learn and understand importance of maintaining oxygen saturations>88%;To learn and demonstrate proper pursed  lip breathing techniques or other breathing techniques.  To learn and understand importance of maintaining oxygen saturations>88%;To learn and understand importance of monitoring SPO2 with pulse oximeter and demonstrate accurate use of the pulse oximeter. To learn and understand importance of maintaining oxygen saturations>88%;To learn and understand importance of monitoring SPO2 with pulse oximeter and demonstrate accurate use of the pulse oximeter. To learn and understand importance of maintaining oxygen saturations>88%;To learn and understand importance of monitoring SPO2 with pulse oximeter and demonstrate accurate use of the pulse oximeter.;To learn and exhibit compliance with exercise, home and travel O2 prescription;To learn and demonstrate  proper pursed lip breathing techniques or other breathing techniques. ;To learn and demonstrate proper use of respiratory medications   Long  Term Goals Exhibits compliance with exercise, home  and travel O2 prescription;Verbalizes importance of monitoring SPO2 with pulse oximeter and return demonstration;Maintenance of O2 saturations>88%;Exhibits proper breathing techniques, such as pursed lip breathing or other method taught during program session;Compliance with respiratory medication Exhibits compliance with exercise, home  and travel O2 prescription;Verbalizes importance of monitoring SPO2 with pulse oximeter and return demonstration;Maintenance of O2 saturations>88%;Exhibits proper breathing techniques, such as pursed lip breathing or other method taught during program session;Compliance with respiratory medication Verbalizes importance of monitoring SPO2 with pulse oximeter and return demonstration;Maintenance of O2 saturations>88% Verbalizes importance of monitoring SPO2 with pulse oximeter and return demonstration;Maintenance of O2 saturations>88% Verbalizes importance of monitoring SPO2 with pulse oximeter and return demonstration;Maintenance of O2  saturations>88%;Exhibits compliance with exercise, home  and travel O2 prescription;Exhibits proper breathing techniques, such as pursed lip breathing or other method taught during program session;Compliance with respiratory medication;Demonstrates proper use of MDIs   Comments Reviewed PLB technique with pt.  Talked about how it works and it's importance in maintaining their exercise saturations. Patient stated that she does not wear her oxygen at all times during the day, but monitors her SaO2 levels and wears her O2 if her numbers drop below 88%. She reports most of the time that her SaO2 remains in the low 90's. She does consistently wear her oxygen at night while sleeping. She has a pulse oximeter to check her oxygen saturation at home. Informed  and explained why it is important to have one. Reviewed that oxygen saturations should be 88 percent and above. Patient verbalizes understanding. Parul uses oxygen if she is going out, or her husband pushes her in a Database administrator.  She does check oxygen at home and it is usauly 90 or above. Ashliegh is doing well with her oxygen compliance.  She uses her CPAP nightly.  She has found now that she does not need her oxygen as much at home, but will use it when she goes out and about.  Her saturations are staying up and she will put on her oxygen if she feels like it is getting low.   Goals/Expected Outcomes Short: Become more profiecient at using PLB.   Long: Become independent at using PLB. Short: beocme more consistent with home O2 use. Long: become independent in controlling SOB with O2 useage and PLB techniques. Short: monitor oxygen at home with exertion. Long: maintain oxygen saturations above 88 percent independently. Short/Long: continue to monitor at home and keep sats above 88% Short: Continue to use PLB to help with breathing Long: Conitnue to improve SOB    Row Name 10/06/21 1012             Program Oxygen Prescription   Program Oxygen Prescription  E-Tanks       Liters per minute 3         Home Oxygen   Home Oxygen Device Home Concentrator;Portable Concentrator       Sleep Oxygen Prescription CPAP;Continuous       Liters per minute 3       Home Exercise Oxygen Prescription Continuous       Liters per minute 3       Home Resting Oxygen Prescription None       Liters per minute 3  PRN       Compliance with Home Oxygen Use Yes  Goals/Expected Outcomes   Short Term Goals To learn and understand importance of maintaining oxygen saturations>88%;To learn and understand importance of monitoring SPO2 with pulse oximeter and demonstrate accurate use of the pulse oximeter.       Long  Term Goals Maintenance of O2 saturations>88%;Verbalizes importance of monitoring SPO2 with pulse oximeter and return demonstration       Comments She has a pulse oximeter to check her oxygen saturation at home. Informed and explained why it is important to have one. Reviewed that oxygen saturations should be 88 percent and above. Patient Verbalizes understanding.       Goals/Expected Outcomes Short: monitor oxygen at home with exertion. Long: maintain oxygen saturations above 88 percent independently.                Oxygen Discharge (Final Oxygen Re-Evaluation):  Oxygen Re-Evaluation - 10/06/21 1012       Program Oxygen Prescription   Program Oxygen Prescription E-Tanks    Liters per minute 3      Home Oxygen   Home Oxygen Device Home Concentrator;Portable Concentrator    Sleep Oxygen Prescription CPAP;Continuous    Liters per minute 3    Home Exercise Oxygen Prescription Continuous    Liters per minute 3    Home Resting Oxygen Prescription None    Liters per minute 3   PRN   Compliance with Home Oxygen Use Yes      Goals/Expected Outcomes   Short Term Goals To learn and understand importance of maintaining oxygen saturations>88%;To learn and understand importance of monitoring SPO2 with pulse oximeter and demonstrate accurate use of the  pulse oximeter.    Long  Term Goals Maintenance of O2 saturations>88%;Verbalizes importance of monitoring SPO2 with pulse oximeter and return demonstration    Comments She has a pulse oximeter to check her oxygen saturation at home. Informed and explained why it is important to have one. Reviewed that oxygen saturations should be 88 percent and above. Patient Verbalizes understanding.    Goals/Expected Outcomes Short: monitor oxygen at home with exertion. Long: maintain oxygen saturations above 88 percent independently.             Initial Exercise Prescription:  Initial Exercise Prescription - 05/18/21 1500       Date of Initial Exercise RX and Referring Provider   Date 05/18/21    Referring Provider [name]      Oxygen   Oxygen Continuous    Liters 3    Maintain Oxygen Saturation 88% or higher      Treadmill   MPH 1.5    Grade 0    Minutes 15    METs 2.15      NuStep   Level 1    SPM 80    Minutes 15    METs 2      REL-XR   Level 1    Speed 50    Minutes 15    METs 2      T5 Nustep   Level 1    SPM 80    Minutes 15    METs 2      Track   Laps 20    Minutes 15    METs 2      Prescription Details   Frequency (times per week) 3    Duration Progress to 30 minutes of continuous aerobic without signs/symptoms of physical distress      Intensity   THRR 40-80% of Max Heartrate 112-135    Ratings  of Perceived Exertion 11-13    Perceived Dyspnea 0-4      Resistance Training   Training Prescription Yes    Weight 3 lb    Reps 10-15             Perform Capillary Blood Glucose checks as needed.  Exercise Prescription Changes:   Exercise Prescription Changes     Row Name 05/18/21 1500 06/06/21 1400 06/19/21 1500 07/05/21 1100 07/17/21 1000     Response to Exercise   Blood Pressure (Admit) 118/68 128/76 122/76 110/70 122/64   Blood Pressure (Exercise) 138/84 132/60 138/62 130/60 122/62   Blood Pressure (Exit) 126/78 124/68 118/62 122/68 122/60    Heart Rate (Admit) 90 bpm 103 bpm 91 bpm 90 bpm 94 bpm   Heart Rate (Exercise) 120 bpm 114 bpm 113 bpm 115 bpm 116 bpm   Heart Rate (Exit) 100 bpm 110 bpm 93 bpm 98 bpm 107 bpm   Oxygen Saturation (Admit) 95 % 96 % 94 % 96 % 96 %   Oxygen Saturation (Exercise) 89 % 94 % 93 % 95 % 94 %   Oxygen Saturation (Exit) 98 % 96 % 97 % 96 % 95 %   Rating of Perceived Exertion (Exercise) 11 11 11 11 11    Perceived Dyspnea (Exercise) 2 2 2 2 1    Symptoms -- -- SOB -- --   Duration -- Progress to 30 minutes of  aerobic without signs/symptoms of physical distress Progress to 30 minutes of  aerobic without signs/symptoms of physical distress Continue with 30 min of aerobic exercise without signs/symptoms of physical distress. Continue with 30 min of aerobic exercise without signs/symptoms of physical distress.   Intensity -- THRR unchanged THRR unchanged THRR unchanged THRR unchanged     Progression   Progression -- Continue to progress workloads to maintain intensity without signs/symptoms of physical distress. Continue to progress workloads to maintain intensity without signs/symptoms of physical distress. Continue to progress workloads to maintain intensity without signs/symptoms of physical distress. Continue to progress workloads to maintain intensity without signs/symptoms of physical distress.   Average METs -- 2.6 2.2 1.8 2.49     Resistance Training   Training Prescription -- Yes Yes Yes Yes   Weight -- 3 lb 3 lb 3 lb 3 lb   Reps -- 10-15 10-15 10-15 10-15     Interval Training   Interval Training -- -- No No No     Oxygen   Oxygen -- Continuous Continuous Continuous Continuous   Liters -- 3 3 3 3      Treadmill   MPH -- 1.6 1.9 1.9 2.5   Grade -- 0 0 0 0   Minutes -- 15 15 15 15    METs -- 2.23 2.45 2.45 2.91     NuStep   Level -- -- -- -- 3   Minutes -- -- -- -- 15   METs -- -- -- -- 2.4     Arm Ergometer   Level -- -- -- -- 4   Minutes -- -- -- -- 15   METs -- -- -- -- 1.2      REL-XR   Level -- 1 -- 1 1   Minutes -- 15 -- 15 15   METs -- 3 -- 1.2 2     T5 Nustep   Level -- -- 2 -- 2   Minutes -- -- 15 -- 15   METs -- -- 2 -- 2     Oxygen   Maintain  Oxygen Saturation -- 88% or higher 88% or higher 88% or higher 88% or higher    Row Name 07/31/21 1300 08/14/21 1300 08/30/21 1200 09/11/21 1000 10/09/21 1600     Response to Exercise   Blood Pressure (Admit) 122/64 122/70 118/64 124/70 118/72   Blood Pressure (Exit) 118/62 102/66 112/66 104/62 102/58   Heart Rate (Admit) 99 bpm 99 bpm 84 bpm 89 bpm 88 bpm   Heart Rate (Exercise) 121 bpm 115 bpm 103 bpm 118 bpm 119 bpm   Heart Rate (Exit) 105 bpm 103 bpm 90 bpm 103 bpm 90 bpm   Oxygen Saturation (Admit) 99 % 99 % 97 % 100 % 93 %   Oxygen Saturation (Exercise) 97 % 96 % 98 % 98 % 98 %   Oxygen Saturation (Exit) 95 % 96 % 97 % 98 % 93 %   Rating of Perceived Exertion (Exercise) 12 11 11 13 11    Perceived Dyspnea (Exercise) 3 2 1 2 2    Symptoms -- SOB -- SOB SOB   Duration Continue with 30 min of aerobic exercise without signs/symptoms of physical distress. Continue with 30 min of aerobic exercise without signs/symptoms of physical distress. Continue with 30 min of aerobic exercise without signs/symptoms of physical distress. Continue with 30 min of aerobic exercise without signs/symptoms of physical distress. Continue with 30 min of aerobic exercise without signs/symptoms of physical distress.   Intensity THRR unchanged THRR unchanged THRR unchanged THRR unchanged THRR unchanged     Progression   Progression Continue to progress workloads to maintain intensity without signs/symptoms of physical distress. Continue to progress workloads to maintain intensity without signs/symptoms of physical distress. Continue to progress workloads to maintain intensity without signs/symptoms of physical distress. Continue to progress workloads to maintain intensity without signs/symptoms of physical distress. Continue to progress  workloads to maintain intensity without signs/symptoms of physical distress.   Average METs 2.35 2.32 2.35 2.34 2.39     Resistance Training   Training Prescription Yes Yes Yes Yes Yes   Weight 3 lb 3 lb 3 lb 3 lb 3 lb   Reps 10-15 10-15 10-15 10-15 10-15     Interval Training   Interval Training No No No No No     Oxygen   Oxygen Continuous Continuous Continuous Continuous Continuous   Liters 3 3 3 3 3      Treadmill   MPH 2.2 2.2 2.2 2.2 2.3   Grade 0 0 0 0 0   Minutes 15 15 15 15 15    METs 2.69 2.68 2.68 2.69 2.76     NuStep   Level -- -- -- -- 3   Minutes -- -- -- -- 15   METs -- -- -- -- 2.2     REL-XR   Level -- 1 2 2 2    Minutes -- 15 15 15 15    METs -- 2.3 2 2  2.6     T5 Nustep   Level 2 2 -- 2 3   Minutes 15 15 -- 15 15   METs 2 2 -- 2 2     Home Exercise Plan   Plans to continue exercise at Home (comment) Home (comment) Home (comment) Home (comment) Home (comment)   Frequency Add 2 additional days to program exercise sessions. Add 2 additional days to program exercise sessions. Add 2 additional days to program exercise sessions. Add 2 additional days to program exercise sessions. Add 2 additional days to program exercise sessions.   Initial Home Exercises  Provided 07/31/21 07/31/21 07/31/21 07/31/21 07/31/21     Oxygen   Maintain Oxygen Saturation 88% or higher 88% or higher 88% or higher 88% or higher 88% or higher    Row Name 10/24/21 0800             Response to Exercise   Blood Pressure (Admit) 112/74       Blood Pressure (Exit) 104/62       Heart Rate (Admit) 100 bpm       Heart Rate (Exercise) 114 bpm       Heart Rate (Exit) 108 bpm       Oxygen Saturation (Admit) 96 %       Oxygen Saturation (Exercise) 94 %       Oxygen Saturation (Exit) 96 %       Rating of Perceived Exertion (Exercise) 11       Perceived Dyspnea (Exercise) 2       Symptoms SOB       Duration Continue with 30 min of aerobic exercise without signs/symptoms of physical  distress.       Intensity THRR unchanged         Progression   Progression Continue to progress workloads to maintain intensity without signs/symptoms of physical distress.       Average METs 2.5         Resistance Training   Training Prescription Yes       Weight 3 lb       Reps 10-15         Interval Training   Interval Training No         Oxygen   Oxygen Continuous       Liters 3         Treadmill   MPH 2.4       Grade 0       Minutes 15       METs 2.84         T5 Nustep   Level 3       Minutes 15         Home Exercise Plan   Plans to continue exercise at Home (comment)       Frequency Add 2 additional days to program exercise sessions.       Initial Home Exercises Provided 07/31/21         Oxygen   Maintain Oxygen Saturation 88% or higher                Exercise Comments:   Exercise Comments     Row Name 05/29/21 0954           Exercise Comments First full day of exercise!  Patient was oriented to gym and equipment including functions, settings, policies, and procedures.  Patient's individual exercise prescription and treatment plan were reviewed.  All starting workloads were established based on the results of the 6 minute walk test done at initial orientation visit.  The plan for exercise progression was also introduced and progression will be customized based on patient's performance and goals.                Exercise Goals and Review:   Exercise Goals     Row Name 05/18/21 1552             Exercise Goals   Increase Physical Activity Yes       Intervention Provide advice, education, support and counseling about physical activity/exercise needs.;Develop an individualized exercise prescription  for aerobic and resistive training based on initial evaluation findings, risk stratification, comorbidities and participant's personal goals.       Expected Outcomes Short Term: Attend rehab on a regular basis to increase amount of physical  activity.;Long Term: Add in home exercise to make exercise part of routine and to increase amount of physical activity.;Long Term: Exercising regularly at least 3-5 days a week.       Increase Strength and Stamina Yes       Intervention Provide advice, education, support and counseling about physical activity/exercise needs.;Develop an individualized exercise prescription for aerobic and resistive training based on initial evaluation findings, risk stratification, comorbidities and participant's personal goals.       Expected Outcomes Short Term: Increase workloads from initial exercise prescription for resistance, speed, and METs.;Short Term: Perform resistance training exercises routinely during rehab and add in resistance training at home;Long Term: Improve cardiorespiratory fitness, muscular endurance and strength as measured by increased METs and functional capacity (6MWT)       Able to understand and use rate of perceived exertion (RPE) scale Yes       Intervention Provide education and explanation on how to use RPE scale       Expected Outcomes Short Term: Able to use RPE daily in rehab to express subjective intensity level;Long Term:  Able to use RPE to guide intensity level when exercising independently       Able to understand and use Dyspnea scale Yes       Intervention Provide education and explanation on how to use Dyspnea scale       Expected Outcomes Short Term: Able to use Dyspnea scale daily in rehab to express subjective sense of shortness of breath during exertion;Long Term: Able to use Dyspnea scale to guide intensity level when exercising independently       Knowledge and understanding of Target Heart Rate Range (THRR) Yes       Intervention Provide education and explanation of THRR including how the numbers were predicted and where they are located for reference       Expected Outcomes Short Term: Able to state/look up THRR;Short Term: Able to use daily as guideline for intensity in  rehab;Long Term: Able to use THRR to govern intensity when exercising independently       Able to check pulse independently Yes       Intervention Provide education and demonstration on how to check pulse in carotid and radial arteries.;Review the importance of being able to check your own pulse for safety during independent exercise       Expected Outcomes Short Term: Able to explain why pulse checking is important during independent exercise;Long Term: Able to check pulse independently and accurately       Understanding of Exercise Prescription Yes       Intervention Provide education, explanation, and written materials on patient's individual exercise prescription       Expected Outcomes Short Term: Able to explain program exercise prescription;Long Term: Able to explain home exercise prescription to exercise independently                Exercise Goals Re-Evaluation :  Exercise Goals Re-Evaluation     Row Name 05/29/21 0954 06/06/21 1422 06/19/21 1514 06/26/21 1034 07/05/21 1200     Exercise Goal Re-Evaluation   Exercise Goals Review Increase Physical Activity;Able to understand and use rate of perceived exertion (RPE) scale;Knowledge and understanding of Target Heart Rate Range (THRR);Understanding of Exercise Prescription;Increase Strength and  Stamina;Able to understand and use Dyspnea scale;Able to check pulse independently Increase Physical Activity;Increase Strength and Stamina Increase Physical Activity;Increase Strength and Stamina;Understanding of Exercise Prescription Increase Physical Activity;Increase Strength and Stamina;Understanding of Exercise Prescription --   Comments Reviewed RPE and dyspnea scales, THR and program prescription with pt today.  Pt voiced understanding and was given a copy of goals to take home. Golden is doing well so far.  She has increased to 1.6 on TM.  Oxygen has stayed int ths 90s on 3 L during exercise. Gordana has only attended once since last review.   She is currently out with COVID.  We will continue to monitor her progress upon return (hopefully next week). Lyndsie has returned to class since having COVID. This was her first day back She tolerated exercise well and felt like she should be able to get back to a good start attending rehab on a regular basis. Evamaria has increased TM to 1.9 mph.  She is ready to increase load on XR.  Staff will encourage moving to level 2-3 on XR.   Expected Outcomes Short: Use RPE daily to regulate intensity. Long: Follow program prescription in THR. Short:  attend consistently Long:improve overall stamina Short: Return to consistent attendance Long: Continue to improve stamina Short: return to consistent rehab attendance. Long: Continue to [employer]. Short: increase level on XR Long:  increase overall MET level    Row Name 07/17/21 1030 07/31/21 1036 08/14/21 1304 08/30/21 1234 09/04/21 1010     Exercise Goal Re-Evaluation   Exercise Goals Review Increase Physical Activity;Increase Strength and Stamina;Understanding of Exercise Prescription Increase Physical Activity;Increase Strength and Stamina Increase Physical Activity;Increase Strength and Stamina;Understanding of Exercise Prescription Increase Strength and Stamina;Increase Physical Activity Increase Strength and Stamina;Increase Physical Activity;Understanding of Exercise Prescription   Comments Ishika is doing well in rehab. She has already increased her treadmill speed to 2.5 mph. She should be encouraged to add a small incline to the treadmill. She does hit her [place] most sessions. Will continue to monitor. Reviewed home exercise with pt today.  Pt plans to walk and use stationary bike for exercise.  Reviewed THR, pulse, RPE, sign and symptoms, pulse oximetery and when to call 911 or MD.  Also discussed weather considerations and indoor options.  Pt voiced understanding. Chaelyn is doing well in rehab.  She is currently at 2.2 mph on the treadmill.  We  will encourage her to move up her workloads to continue to make improvements.  We will continue to monitor her progress. Yunuen has missed several sessions due to the holidays but is ready to get back.  She was able to work at previous levels this session. We will continue to monitor progress. Shenea is doing well in rehab. She is not doing much for exercise at home on her off days.  However, she does her best to stay active and keep moving up until 6pm and then she is tired for the day. She is starting to get back her strength and stamina.   Expected Outcomes Short: Add incline to treadmill Long: Continue to build up overall strength and stamina Short: monitor HR and O2 whe exercising at home Long: become independent with exercise Short: Increase workloads Long: Conitnue to improve stamina Short: get back to consistent attendance Long:  build overall stamina Short: try to add in exercise at home Long: continue to improve stamina.    [place] Name 09/11/21 1011 10/06/21 1010 10/09/21 1559 10/24/21 1155  Exercise Goal Re-Evaluation   Exercise Goals Review Increase Physical Activity;Increase Strength and Stamina Increase Physical Activity;Increase Strength and Stamina;Understanding of Exercise Prescription Increase Physical Activity;Increase Strength and Stamina;Understanding of Exercise Prescription Increase Physical Activity;Increase Strength and Stamina    Comments Veronda's attendance has been sporadic and she will be out the next couple of weeks due to doctor appointments.  She has been working at the same level on the T5 and XR as well as the same speed with no incline on the treadmill. She would at least benefit from adding incline to the treadmill in addition to increase speed as tolerated. Will continue to monitor When Cynthis is done she is not sure where she wants to exercise. She is thinking about joining the [facility] down the hall. She wants to continue to exercise after the program is over.  Ornella is doing well in rehab.  She is up to 2.3 mph on the treadmill and up to level 3 on the steppers.  We will conitnue to monitor her progress. Miosha is consistently at RPE 10-11 and lower end of THR range.  Staff will encourage trying incline on TM to reach RPE 13.  She is getting close to post walk test and we expect to see improvement.    Expected Outcomes Short: Add incline to the treadmill Long: Increase overall MET level Short: think about signing up for a gym membership. Long: join a gym. Short: Keep steppers at level 3 consistently Long; Conitnue to look at how to keep exercising after graduation Short:  try incline on TM, improve post walk Long:  complete LW program             Discharge Exercise Prescription (Final Exercise Prescription Changes):  Exercise Prescription Changes - 10/24/21 0800       Response to Exercise   Blood Pressure (Admit) 112/74    Blood Pressure (Exit) 104/62    Heart Rate (Admit) 100 bpm    Heart Rate (Exercise) 114 bpm    Heart Rate (Exit) 108 bpm    Oxygen Saturation (Admit) 96 %    Oxygen Saturation (Exercise) 94 %    Oxygen Saturation (Exit) 96 %    Rating of Perceived Exertion (Exercise) 11    Perceived Dyspnea (Exercise) 2    Symptoms SOB    Duration Continue with 30 min of aerobic exercise without signs/symptoms of physical distress.    Intensity THRR unchanged      Progression   Progression Continue to progress workloads to maintain intensity without signs/symptoms of physical distress.    Average METs 2.5      Resistance Training   Training Prescription Yes    Weight 3 lb    Reps 10-15      Interval Training   Interval Training No      Oxygen   Oxygen Continuous    Liters 3      Treadmill   MPH 2.4    Grade 0    Minutes 15    METs 2.84      T5 Nustep   Level 3    Minutes 15      Home Exercise Plan   Plans to continue exercise at Home (comment)    Frequency Add 2 additional days to program exercise sessions.     Initial Home Exercises Provided 07/31/21      Oxygen   Maintain Oxygen Saturation 88% or higher             Nutrition:  Target  Goals: Understanding of nutrition guidelines, daily intake of sodium <1579m, cholesterol <2072m calories 30% from fat and 7% or less from saturated fats, daily to have 5 or more servings of fruits and vegetables.  Education: All About Nutrition: -Group instruction provided by verbal, written material, interactive activities, discussions, models, and posters to present general guidelines for heart healthy nutrition including fat, fiber, MyPlate, the role of sodium in heart healthy nutrition, utilization of the nutrition label, and utilization of this knowledge for meal planning. Follow up email sent as well. Written material given at graduation. Flowsheet [facility]  Date 07/12/21  [facility] Code 1- Verbalizes Understanding       Biometrics:  Pre Biometrics - 05/18/21 1552       Pre Biometrics   Height 5' 5.5" (1.664 m)    Weight 203 lb 3.2 oz (92.2 kg)    BMI (Calculated) 33.29    Single Leg Stand 2.35 seconds             Post Biometrics - 10/27/21 1026        Post  Biometrics   Height 5' 5.5" (1.664 m)    Weight 197 lb 9.6 oz (89.6 kg)    BMI (Calculated) 32.37    Single Leg Stand 4.5 seconds             Nutrition Therapy Plan and Nutrition Goals:  Nutrition Therapy & Goals - 07/24/21 1048       Nutrition Therapy   Diet Heart healthy, low Na, pulmonary MNT, T2DM    Protein (specify units) 110g    Fiber 25 grams    Whole Grain Foods 3 servings    Saturated Fats 12 max. grams    Fruits and Vegetables 8 servings/day    Sodium 1.5 grams      Personal Nutrition Goals   Nutrition Goal ST: work on adding things to her snacks (protein, fiber, or fat)  LT: Continue to keep A1C </= 7, meet protein needs, combine fiber/fat/protein    Comments PYP 61.  Last A1C 7.2 on metformin. Had a cholecystectomy in 2009 and continues to eat lower amounts of fat. She likes salty and sweet foods and she feels that gets in the way of her eating healthy sometimes. B: protein shake: almond milk, strawberris, banana, protein powder  or oikos yogurt with granola L: 1/2 sandwich with fries or chips D: meat (chicken mostly) and salad or a chicken salad. She uses olive oil, butter, and sometimes bacon in her green beans. Doesn't eat many fried foods. Her weight is stable. She reports that if her husband and her were to go out to the movies they would split popcorn and candy, discussed maybe having some popcorn and then bringing some peanuts - the extra fiber, fat, and protein with help to balance this snack/meal better so it is less carbohydrates and she has a better blood sugar response as well as increasing her protein intake. Discussed heart healthy/T2DM eating as well as pulmonary MNT.      Intervention Plan   Intervention Prescribe, educate and counsel regarding individualized specific dietary modifications aiming towards targeted core components such as weight, hypertension, lipid management, diabetes, heart failure and other comorbidities.    Expected Outcomes Short Term Goal: Understand basic principles of dietary content, such as calories, fat, sodium, cholesterol and nutrients.;Short Term Goal: A plan has been developed with personal nutrition goals set during dietitian appointment.;Long Term  Goal: Adherence to prescribed nutrition plan.             Nutrition Assessments:  MEDIFICTS Score Key: ?70 Need to make dietary changes  40-70 Heart Healthy Diet ? 40 Therapeutic Level Cholesterol Diet  Flowsheet Row Pulmonary Rehab from 10/30/2021 in [facility]  Picture Your Plate Total Score on Discharge 53      Picture Your Plate Scores: <96 Unhealthy dietary pattern with much room for improvement. 41-50 Dietary pattern unlikely to meet  recommendations for good health and room for improvement. 51-60 More healthful dietary pattern, with some room for improvement.  >60 Healthy dietary pattern, although there may be some specific behaviors that could be improved.   Nutrition Goals Re-Evaluation:  Nutrition Goals Re-Evaluation     [place] Name 06/26/21 1045 07/10/21 1003 07/31/21 1023 09/04/21 1012 10/06/21 1018     Goals   Current Weight -- 201 lb (91.2 kg) -- -- 198 lb (89.8 kg)   Nutrition Goal -- meet with dietitian -- ST: work on adding things to her snacks (protein, fiber, or fat)  LT: Continue to keep A1C </= 7, meet protein needs, combine fiber/fat/protein Eat less. Watch sugar and Salt intake.   Comment Patient has not yet met with program dietician. Patient has not yet met with program dietician. Kadajah is stil working on dietary changes.  She says it is more difficult this time of year. Cynthis is doing well in rehab. She has lost some weight and doing pretty well with her diet.  She is back on track now that the holidays behind her.  She continues to improve her diet and trying to find more balance again Patient was informed on why it is important to maintain a balanced diet when dealing with Respiratory issues. Explained that it takes a lot of energy to breath and when they are short of breath often they will need to have a good diet to help keep up with the calories they are expending for breathing.   Expected Outcome Short: meed with dieticain. Short: meed with dieticain. Long: adhere to a diet that pertains to patient. Short:  continue to make small changes Long: reach dietary goals as suggested by RD Short: Continue to adjust back to diet Long: Continue to try to get more variety into her diet. Short: Choose and plan snacks accordingly to patients caloric intake to improve breathing. Long: Maintain a diet independently that meets their caloric intake to aid in daily shortness of breath.            Nutrition Goals  Discharge (Final Nutrition Goals Re-Evaluation):  Nutrition Goals Re-Evaluation - 10/06/21 1018       Goals   Current Weight 198 lb (89.8 kg)    Nutrition Goal Eat less. Watch sugar and Salt intake.    Comment Patient was informed on why it is important to maintain a balanced diet when dealing with Respiratory issues. Explained that it takes a lot of energy to breath and when they are short of breath often they will need to have a good diet to help keep up with the calories they are expending for breathing.    Expected Outcome Short: Choose and plan snacks accordingly to patients caloric intake to improve breathing. Long: Maintain a diet independently that meets their caloric intake to aid in daily shortness of breath.             Psychosocial: Target Goals: Acknowledge presence or absence of significant depression and/or  stress, maximize coping skills, provide positive support system. Participant is able to verbalize types and ability to use techniques and skills needed for reducing stress and depression.   Education: Stress, Anxiety, and Depression - Group verbal and visual presentation to define topics covered.  Reviews how body is impacted by stress, anxiety, and depression.  Also discusses healthy ways to reduce stress and to treat/manage anxiety and depression.  Written material given at graduation.   Education: Sleep Hygiene -Provides group verbal and written instruction about how sleep can affect your health.  Define sleep hygiene, discuss sleep cycles and impact of sleep habits. Review good sleep hygiene tips.    Initial Review & Psychosocial Screening:   Quality of Life Scores:  Scores of 19 and below usually indicate a poorer quality of life in these areas.  A difference of  2-3 points is a clinically meaningful difference.  A difference of 2-3 points in the total score of the Quality of Life Index has been associated with significant improvement in overall quality of life,  self-image, physical symptoms, and general health in studies assessing change in quality of life.  PHQ-9: Recent Review Flowsheet Data     Depression screen [facility] 2/9 10/02/2021 09/01/2021 07/17/2021 07/10/2021 06/26/2021   Decreased Interest 1 1 0 1 1   Down, Depressed, Hopeless 1 0 0 0 1   PHQ - 2 Score 2 1 0 1 2   Altered sleeping 2 1 1 3 1    Tired, decreased energy 2 2 2 2 2    Change in appetite 1 1 1 3 1    Feeling bad or failure about yourself  0 0 0 0 0   Trouble concentrating 0 2 0 0 1   Moving slowly or fidgety/restless 0 0 0 0 1   Suicidal thoughts 0 0 0 0 0   PHQ-9 Score 7 7 4 9 8    Difficult doing work/chores Somewhat difficult Not difficult at all Not difficult at all Not difficult at all Not difficult at all      Interpretation of Total Score  Total Score Depression Severity:  1-4 = Minimal depression, 5-9 = Mild depression, 10-14 = Moderate depression, 15-19 = Moderately severe depression, 20-27 = Severe depression   Psychosocial Evaluation and Intervention:   Psychosocial Re-Evaluation:  Psychosocial Re-Evaluation     Row Name 06/26/21 1046 07/10/21 1001 07/17/21 1033 07/31/21 1025 09/01/21 1039     Psychosocial Re-Evaluation   Current issues with -- -- -- Current Sleep Concerns Current Stress Concerns   Comments Patient reports no new stress or sleep concerns. She did a PHQ reevaluation with a score of 8, but attributes most of her difficulties to her physical condition, not to mental health concerns. Reviewed patient health questionnaire (PHQ-9) with patient for follow up. Previously, patients score indicated signs/symptoms of depression.  Reviewed to see if patient is improving symptom wise while in program.  Score declined and patient states that it is because she is not sleeping well and feels like she has little energy. Reviewed patient health questionnaire (PHQ-9) with patient for follow up. Previously, patients score indicated signs/symptoms of depression.   Reviewed to see if patient is improving symptom wise while in program.  Score declined and patient states that it is because she is not sleeping well and feels like she has little energy. Jerrilynn is sleeping a little better now.  She does usually nap during the day and stay up later at night. Reviewed patient health questionnaire (PHQ-9) with  patient for follow up. Previously, patients score indicated signs/symptoms of depression.  Reviewed to see if patient is improving symptom wise while in program.  Score declined and patient states that it is because her energy levels have been lower.   Expected Outcomes Short: continue to attend pulmonary rehab consistently. Long: maintian good metal health habits. Short: Continue to work toward an improvement in [place] scores by attending [facility] regularly. Long: Continue to improve stress and depression coping skills by talking with staff and attending [facility] regularly and work toward a positive mental state. Short: Continue to work toward an improvement in [place] scores by attending [facility] regularly. Long: Continue to improve stress and depression coping skills by talking with staff and attending [facility] regularly and work toward a positive mental state. Short:   continue to work on sleep patterns Long: feel rested day to day Short: Continue to work toward an improvement in [place] scores by attending [facility] regularly. Long: Continue to improve stress and depression coping skills by talking with staff and attending [facility] regularly and work toward a positive mental state.   Interventions Encouraged to attend Pulmonary Rehabilitation for the exercise Encouraged to attend Pulmonary Rehabilitation for the exercise -- -- Encouraged to attend Pulmonary Rehabilitation for the exercise   Continue Psychosocial Services  Follow up required by staff Follow up required by staff -- -- Follow up required by staff    [place] Name 10/06/21 1024              Psychosocial Re-Evaluation   Current issues with Current Stress Concerns       Comments Reviewed patient health questionnaire (PHQ-9) with patient for follow up. Previously, patients score indicated signs/symptoms of depression.  Reviewed to see if patient is improving symptoms wise while in program.  Score stayed and patient states that it is because she has other things going on with her husbans health.       Expected Outcomes Short: Continue to work toward an improvement in [place] scores by attending [facility] regularly. Long: Continue to improve stress and depression coping skills by talking with staff and attending [facility] regularly and work toward a positive mental state.       Interventions Encouraged to attend Pulmonary Rehabilitation for the exercise       Continue Psychosocial Services  Follow up required by staff                Psychosocial Discharge (Final Psychosocial Re-Evaluation):  Psychosocial Re-Evaluation - 10/06/21 1024       Psychosocial Re-Evaluation   Current issues with Current Stress Concerns    Comments Reviewed patient health questionnaire (PHQ-9) with patient for follow up. Previously, patients score indicated signs/symptoms of depression.  Reviewed to see if patient is improving symptoms wise while in program.  Score stayed and patient states that it is because she has other things going on with her husbans health.    Expected Outcomes Short: Continue to work toward an improvement in [place] scores by attending [facility] regularly. Long: Continue to improve stress and depression coping skills by talking with staff and attending [facility] regularly and work toward a positive mental state.    Interventions Encouraged to attend Pulmonary Rehabilitation for the exercise    Continue Psychosocial Services  Follow up required by staff             Education: Education Goals: Education classes will be provided on a weekly basis, covering required topics.  Participant will state understanding/return demonstration of topics presented.  Learning Barriers/Preferences:   General Pulmonary Education Topics:  Infection Prevention: - Provides verbal and written material to individual with discussion of infection control including proper hand washing and proper equipment cleaning during exercise session. Flowsheet Row Pulmonary Rehab from 10/11/2021 in [facility] Card[facility] and Pulmonary Rehab  Date 05/18/21  Educator AS  Instruction Review Code 1- Verbalizes Understanding       Falls Prevention: - Provides verbal and written material to individual with discussion of falls prevention and safety. Flowsheet Row Pulmonary Rehab from 10/11/2021 in [facility] Cardiac and Pulmonary Rehab  Date 05/18/21  Educator AS  Instruction Review Code 1- Verbalizes Understanding       Chronic Lung Disease Review: - Group verbal instruction with posters, models, PowerPoint presentations and videos,  to review new updates, new respiratory medications, new advancements in procedures and treatments. Providing information on websites and "800" numbers for continued self-education. Includes information about supplement oxygen, available portable oxygen systems, continuous and intermittent flow rates, oxygen safety, concentrators, and Medicare reimbursement for oxygen. Explanation of Pulmonary Drugs, including class, frequency, complications, importance of spacers, rinsing mouth after steroid MDI's, and proper cleaning methods for nebulizers. Review of basic lung anatomy and physiology related to function, structure, and complications of lung disease. Review of risk factors. Discussion about methods for diagnosing sleep apnea and types of masks and machines for OSA. Includes a review of the use of types of environmental controls: home humidity, furnaces, filters, dust mite/pet prevention, HEPA vacuums. Discussion about weather changes, air quality and the benefits of nasal washing.  Instruction on Warning signs, infection symptoms, calling MD promptly, preventive modes, and value of vaccinations. Review of effective airway clearance, coughing and/or vibration techniques. Emphasizing that all should Create an Action Plan. Written material given at graduation. Flowsheet Row Pulmonary Rehab from 10/11/2021 in [facility] Cardiac and Pulmonary Rehab  Date 05/31/21  Educator [facility]  Instruction Review Code 1- Verbalizes Understanding       AED/CPR: - Group verbal and written instruction with the use of models to demonstrate the basic use of the AED with the basic ABC's of resuscitation.    Anatomy and Cardiac Procedures: - Group verbal and visual presentation and models provide information about basic cardiac anatomy and function. Reviews the testing methods done to diagnose heart disease and the outcomes of the test results. Describes the treatment choices: Medical Management, Angioplasty, or Coronary Bypass Surgery for treating various heart conditions including Myocardial Infarction, Angina, Valve Disease, and Cardiac Arrhythmias.  Written material given at graduation. Flowsheet Row Pulmonary Rehab from 10/11/2021 in [facility] Cardiac and Pulmonary Rehab  Date 08/30/21  Educator SB  Instruction Review Code 1- Verbalizes Understanding       Medication Safety: - Group verbal and visual instruction to review commonly prescribed medications for heart and lung disease. Reviews the medication, class of the drug, and side effects. Includes the steps to properly store meds and maintain the prescription regimen.  Written material given at graduation.   Other: -Provides group and verbal instruction on various topics (see comments)   Knowledge Questionnaire Score:  Knowledge Questionnaire Score - 10/30/21 1010       Knowledge Questionnaire Score   Post Score 17/18              Core Components/Risk Factors/Patient Goals at Admission:  Personal Goals and Risk Factors at Admission -  05/18/21 1600       Core Components/Risk Factors/Patient Goals on Admission    Weight Management Yes    Intervention Weight Management:  Develop a combined nutrition and exercise program designed to reach desired caloric intake, while maintaining appropriate intake of nutrient and fiber, sodium and fats, and appropriate energy expenditure required for the weight goal.;Weight Management/Obesity: Establish reasonable short term and long term weight goals.    Admit Weight 204 lb (92.5 kg)    Goal Weight: Short Term 200 lb (90.7 kg)    Goal Weight: Long Term 150 lb (68 kg)    Expected Outcomes Short Term: Continue to assess and modify interventions until short term weight is achieved;Long Term: Adherence to nutrition and physical activity/exercise program aimed toward attainment of established weight goal;Weight Loss: Understanding of general recommendations for a balanced deficit meal plan, which promotes 1-2 lb weight loss per week and includes a negative energy balance of [phone] kcal/d    Improve shortness of breath with ADL's Yes    Intervention Provide education, individualized exercise plan and daily activity instruction to help decrease symptoms of SOB with activities of daily living.    Expected Outcomes Short Term: Improve cardiorespiratory fitness to achieve a reduction of symptoms when performing ADLs;Long Term: Be able to perform more ADLs without symptoms or delay the onset of symptoms    Intervention Provide education, demonstration and support about specific breathing techniuqes utilized for more efficient breathing. Include techniques such as pursed lipped breathing, diaphragmatic breathing and self-pacing activity.    Expected Outcomes Short Term: Participant will be able to demonstrate and use breathing techniques as needed throughout daily activities.    Increase knowledge of respiratory medications and ability to use respiratory devices properly  Yes    Intervention Provide education  and demonstration as needed of appropriate use of medications, inhalers, and oxygen therapy.    Expected Outcomes Short Term: Achieves understanding of medications use. Understands that oxygen is a medication prescribed by physician. Demonstrates appropriate use of inhaler and oxygen therapy.;Long Term: Maintain appropriate use of medications, inhalers, and oxygen therapy.    Diabetes Yes    Intervention Provide education about signs/symptoms and action to take for hypo/hyperglycemia.;Provide education about proper nutrition, including hydration, and aerobic/resistive exercise prescription along with prescribed medications to achieve blood glucose in normal ranges: Fasting glucose 65-99 mg/dL    Expected Outcomes Short Term: Participant verbalizes understanding of the signs/symptoms and immediate care of hyper/hypoglycemia, proper foot care and importance of medication, aerobic/resistive exercise and nutrition plan for blood glucose control.;Long Term: Attainment of HbA1C < 7%.    Heart Failure Yes    Intervention Provide a combined exercise and nutrition program that is supplemented with education, support and counseling about heart failure. Directed toward relieving symptoms such as shortness of breath, decreased exercise tolerance, and extremity edema.    Expected Outcomes Improve functional capacity of life;Short term: Attendance in program 2-3 days a week with increased exercise capacity. Reported lower sodium intake. Reported increased fruit and vegetable intake. Reports medication compliance.;Short term: Daily weights obtained and reported for increase. Utilizing diuretic protocols set by physician.;Long term: Adoption of self-care skills and reduction of barriers for early signs and symptoms recognition and intervention leading to self-care maintenance.    Hypertension Yes    Intervention Provide education on lifestyle modifcations including regular physical activity/exercise, weight management,  moderate sodium restriction and increased consumption of fresh fruit, vegetables, and low fat dairy, alcohol moderation, and smoking cessation.;Monitor prescription use compliance.    Expected Outcomes Short Term: Continued assessment and intervention until BP is < 140/58m HG in hypertensive participants. < 130/858mHG in hypertensive participants with diabetes,  heart failure or chronic kidney disease.;Long Term: Maintenance of blood pressure at goal levels.    Lipids Yes    Intervention Provide education and support for participant on nutrition & aerobic/resistive exercise along with prescribed medications to achieve LDL <72m, HDL >44m    Expected Outcomes Short Term: Participant states understanding of desired cholesterol values and is compliant with medications prescribed. Participant is following exercise prescription and nutrition guidelines.;Long Term: Cholesterol controlled with medications as prescribed, with individualized exercise RX and with personalized nutrition plan. Value goals: LDL < 7056mHDL > 40 mg.             Education:Diabetes - Individual verbal and written instruction to review signs/symptoms of diabetes, desired ranges of glucose level fasting, after meals and with exercise. Acknowledge that pre and post exercise glucose checks will be done for 3 sessions at entry of program. Flowsheet [facility]  Date 05/18/21  Educator AS  Instruction Review Code 1- Verbalizes Understanding       Know Your Numbers and Heart Failure: - Group verbal and visual instruction to discuss disease risk factors for cardiac and pulmonary disease and treatment options.  Reviews associated critical values for Overweight/Obesity, Hypertension, Cholesterol, and Diabetes.  Discusses basics of heart failure: signs/symptoms and treatments.  Introduces Heart Failure Zone chart for action plan for heart failure.  Written material given at  graduation.   Core Components/Risk Factors/Patient Goals Review:   Goals and Risk Factor Review     Row Name 06/26/21 1041 07/10/21 1004 07/31/21 1020 09/04/21 1014 10/06/21 1017     Core Components/Risk Factors/Patient Goals Review   Personal Goals Review Weight Management/Obesity;Develop more efficient breathing techniques such as purse lipped breathing and diaphragmatic breathing and practicing self-pacing with activity.;Heart Failure;Increase knowledge of respiratory medications and ability to use respiratory devices properly.;Hypertension;Improve shortness of breath with ADL's;Diabetes;Lipids Improve shortness of breath with ADL's Improve shortness of breath with ADL's;Develop more efficient breathing techniques such as purse lipped breathing and diaphragmatic breathing and practicing self-pacing with activity. Improve shortness of breath with ADL's;Weight Management/Obesity;Hypertension;Heart Failure;Diabetes;Lipids;Increase knowledge of respiratory medications and ability to use respiratory devices properly. Improve shortness of breath with ADL's   Review Patient reports reports that she is taking all her medications. She stated that her Diabetes medication bothers her stomach, and since having COVID it has made it even worse. She is working on building back to her full dose but at this current time she is long able to tolerate a half dose. She is keeping a close eye on her blood sugar as she works back up to her full dose. Patient does not weigh every day and she was encouraged to do so to keep an eye on weight and fluid levels. Spoke to patient about their shortness of breath and what they can do to improve. Patient has been informed of breathing techniques when starting the program. Patient is informed to tell staff if they have had any med changes and that certain meds they are taking or not taking can be causing shortness of breath. CynAlazias not noticed any big changes in her breathing since  starting to exercise.  She has had some stiffness from arthritis.  She does practice PLB at home CynDalani doing well  in rehab. Her weight has gone down some and she hopes that it will continue to improve now that the holidays are over.  She does not check her sugars at home, but has  not noticed any major swings.  Her pressures are getting better and she does check them on occasion at home. She has chronic swelling in her left foot but she notes that it is lower than in the past.  She is doing well with her medications. Her metformin messes with her stomach with double dose so  she has halved her dose and symptoms are more manageble and her doctor is aware. Her breathing is doing fairly well.  She does not need her oxygen at home but will use it when she goes out and about. Spoke to patient about their shortness of breath and what they can do to improve. Patient has been informed of breathing techniques when starting the program. Patient is informed to tell staff if they have had any med changes and that certain meds they are taking or not taking can be causing shortness of breath.   Expected Outcomes Short: work back up to full dose of DM medications and communicate any concerns with doctor. Weight every day. Long: continue improvements with SOB and continue to manage disease with medical care team. Short: Attend [facility] regularly to improve shortness of breath with ADLs. Long: maintain independence with ADLs Short: continue to exercise to help with breathing Long:  beccome proficient at PLB Short: continue to work on breathing Long: continue to monitor risk factors. Short: Attend [facility] regularly to improve shortness of breath with ADLs. Long: maintain independence with ADLs            Core Components/Risk Factors/Patient Goals at Discharge (Final Review):   Goals and Risk Factor Review - 10/06/21 1017       Core Components/Risk Factors/Patient Goals Review   Personal Goals Review Improve  shortness of breath with ADL's    Review Spoke to patient about their shortness of breath and what they can do to improve. Patient has been informed of breathing techniques when starting the program. Patient is informed to tell staff if they have had any med changes and that certain meds they are taking or not taking can be causing shortness of breath.    Expected Outcomes Short: Attend [facility] regularly to improve shortness of breath with ADLs. Long: maintain independence with ADLs             ITP Comments:  ITP Comments     Row Name 05/18/21 1606 05/29/21 0954 05/31/21 1429 06/19/21 1513 06/28/21 0815   ITP Comments Completed 6MWT and gym orientation. Initial ITP created and sent for review to Dr. Ottie Glazier, Medical Director. First full day of exercise!  Patient was oriented to gym and equipment including functions, settings, policies, and procedures.  Patient's individual exercise prescription and treatment plan were reviewed.  All starting workloads were established based on the results of the 6 minute walk test done at initial orientation visit.  The plan for exercise progression was also introduced and progression will be customized based on patient's performance and goals. 30 day review completed. ITP sent to Dr. Zetta Bills, Medical Director of  Pulmonary Rehab. Continue with ITP unless changes are made by physician. Pt is currently out with COVID.  She is due to return on 10/31 30 Day review completed. Medical Director ITP review done, changes made as directed, and signed approval by Medical Director.    [place] Name 07/24/21 1153 07/26/21 0751 08/23/21 1121 09/20/21 0926 10/18/21 0924   ITP Comments Completed initial RD consultation 30 Day review completed. Medical Director ITP review done, changes made as directed, and  signed approval by Medical Director. 30 Day review completed. Medical Director ITP review done, changes made as directed, and signed approval by Medical Director.  30 Day review completed. Medical Director ITP review done, changes made as directed, and signed approval by Medical Director.   3 visits since last review 30 Day review completed. Medical Director ITP review done, changes made as directed, and signed approval by Medical Director.    Row Name 11/03/21 0959           ITP Comments Kirstie graduated today from  rehab with 36 sessions completed.  Details of the patient's exercise prescription and what She needs to do in order to continue the prescription and progress were discussed with patient.  Patient was given a copy of prescription and goals.  Patient verbalized understanding.  Zikeria plans to continue to exercise by walking.                Comments: Discharge ITP

## 2021-11-03 NOTE — Progress Notes (Signed)
Discharge Progress Report  Patient Details  Name: Betty [name] MRN: [id number] Date of Birth: 04/19/1946 Referring Provider:   Flowsheet Row Pulmonary Rehab from 05/18/2021 in [facility]  Referring Provider [name]        Number of Visits: 29  Reason for Discharge:  Patient reached a stable level of exercise. Patient independent in their exercise. Patient has met program and personal goals.  Smoking History:  Social History   Tobacco Use  Smoking Status Never  Smokeless Tobacco Never    Diagnosis:  Restrictive lung disease  ADL UCSD:  Pulmonary Assessment Scores     Row Name 05/18/21 1557 10/30/21 1009       ADL UCSD   ADL Phase Entry Exit    SOB Score total 51 68    Rest 0 1    Walk 2 2    Stairs 2 4    Bath 1 2    Dress 1 2    Shop 3 3      CAT Score   CAT Score 15 16      mMRC Score   mMRC Score 2 --             Initial Exercise Prescription:  Initial Exercise Prescription - 05/18/21 1500       Date of Initial Exercise RX and Referring Provider   Date 05/18/21    Referring Provider [name]      Oxygen   Oxygen Continuous    Liters 3    Maintain Oxygen Saturation 88% or higher      Treadmill   MPH 1.5    Grade 0    Minutes 15    METs 2.15      NuStep   Level 1    SPM 80    Minutes 15    METs 2      REL-XR   Level 1    Speed 50    Minutes 15    METs 2      T5 Nustep   Level 1    SPM 80    Minutes 15    METs 2      Track   Laps 20    Minutes 15    METs 2      Prescription Details   Frequency (times per week) 3    Duration Progress to 30 minutes of continuous aerobic without signs/symptoms of physical distress      Intensity   THRR 40-80% of Max Heartrate 112-135    Ratings of Perceived Exertion 11-13    Perceived Dyspnea 0-4      Resistance Training   Training Prescription Yes    Weight 3 lb    Reps 10-15             Discharge Exercise Prescription (Final Exercise Prescription  Changes):  Exercise Prescription Changes - 10/24/21 0800       Response to Exercise   Blood Pressure (Admit) 112/74    Blood Pressure (Exit) 104/62    Heart Rate (Admit) 100 bpm    Heart Rate (Exercise) 114 bpm    Heart Rate (Exit) 108 bpm    Oxygen Saturation (Admit) 96 %    Oxygen Saturation (Exercise) 94 %    Oxygen Saturation (Exit) 96 %    Rating of Perceived Exertion (Exercise) 11    Perceived Dyspnea (Exercise) 2    Symptoms SOB    Duration Continue with 30 min of aerobic exercise  without signs/symptoms of physical distress.    Intensity THRR unchanged      Progression   Progression Continue to progress workloads to maintain intensity without signs/symptoms of physical distress.    Average METs 2.5      Resistance Training   Training Prescription Yes    Weight 3 lb    Reps 10-15      Interval Training   Interval Training No      Oxygen   Oxygen Continuous    Liters 3      Treadmill   MPH 2.4    Grade 0    Minutes 15    METs 2.84      T5 Nustep   Level 3    Minutes 15      Home Exercise Plan   Plans to continue exercise at Home (comment)    Frequency Add 2 additional days to program exercise sessions.    Initial Home Exercises Provided 07/31/21      Oxygen   Maintain Oxygen Saturation 88% or higher             Functional Capacity:  6 Minute Walk     Row Name 05/18/21 1540 10/27/21 1024       6 Minute Walk   Phase Initial Discharge    Distance 1045 feet 1195 feet    Distance % Change -- 14.35 %    Distance Feet Change -- 150 ft    Walk Time 6 minutes 6 minutes    # of Rest Breaks 0 0    MPH 1.98 2.26    METS 2.14 2.3    RPE 11 13    Perceived Dyspnea  2 2    VO2 Peak 7.5 8.05    Symptoms No No    Resting HR 90 bpm 86 bpm    Resting BP 118/68 132/64    Resting Oxygen Saturation  95 % 98 %    Exercise Oxygen Saturation  during 6 min walk 89 % 94 %    Max Ex. HR 120 bpm 108 bpm    Max Ex. BP 138/84 134/72    2 Minute Post BP 126/78  130/70      Interval HR   1 Minute HR 107 108    2 Minute HR 73 99    3 Minute HR 107 77    4 Minute HR 117 62    5 Minute HR 119 71    6 Minute HR 120 92    2 Minute Post HR 100 102    Interval Heart Rate? Yes Yes      Interval Oxygen   Interval Oxygen? Yes Yes    Baseline Oxygen Saturation % 95 % 98 %    1 Minute Oxygen Saturation % 98 % 98 %    1 Minute Liters of Oxygen 3 L 3 L  continuous    2 Minute Oxygen Saturation % 95 % 97 %    2 Minute Liters of Oxygen 3 L 3 L    3 Minute Oxygen Saturation % 91 % 96 %    3 Minute Liters of Oxygen 3 L 3 L    4 Minute Oxygen Saturation % 92 % 95 %    4 Minute Liters of Oxygen 3 L 3 L    5 Minute Oxygen Saturation % 89 % 94 %    5 Minute Liters of Oxygen 3 L 3 L    6 Minute Oxygen Saturation %  89 % 94 %    6 Minute Liters of Oxygen 3 L 3 L    2 Minute Post Oxygen Saturation % 98 % 97 %    2 Minute Post Liters of Oxygen 3 L 3 L             Psychological, QOL, Others - Outcomes: PHQ 2/9: Depression screen [facility] 2/9 10/02/2021 09/01/2021 07/17/2021 07/10/2021 06/26/2021  Decreased Interest 1 1 0 1 1  Down, Depressed, Hopeless 1 0 0 0 1  PHQ - 2 Score 2 1 0 1 2  Altered sleeping 2 1 1 3 1   Tired, decreased energy 2 2 2 2 2   Change in appetite 1 1 1 3 1   Feeling bad or failure about yourself  0 0 0 0 0  Trouble concentrating 0 2 0 0 1  Moving slowly or fidgety/restless 0 0 0 0 1  Suicidal thoughts 0 0 0 0 0  PHQ-9 Score 7 7 4 9 8   Difficult doing work/chores Somewhat difficult Not difficult at all Not difficult at all Not difficult at all Not difficult at all  Some recent data might be hidden    ]Nutrition & Weight - Outcomes:  Pre Biometrics - 05/18/21 1552       Pre Biometrics   Height 5' 5.5" (1.664 m)    Weight 203 lb 3.2 oz (92.2 kg)    BMI (Calculated) 33.29    Single Leg Stand 2.35 seconds             Post Biometrics - 10/27/21 1026        Post  Biometrics   Height 5' 5.5" (1.664 m)    Weight 197 lb 9.6 oz  (89.6 kg)    BMI (Calculated) 32.37    Single Leg Stand 4.5 seconds             Nutrition:  Nutrition Therapy & Goals - 07/24/21 1048       Nutrition Therapy   Diet Heart healthy, low Na, pulmonary MNT, T2DM    Protein (specify units) 110g    Fiber 25 grams    Whole Grain Foods 3 servings    Saturated Fats 12 max. grams    Fruits and Vegetables 8 servings/day    Sodium 1.5 grams      Personal Nutrition Goals   Nutrition Goal ST: work on adding things to her snacks (protein, fiber, or fat)  LT: Continue to keep A1C </= 7, meet protein needs, combine fiber/fat/protein    Comments PYP 61. Last A1C 7.2 on metformin. Had a cholecystectomy in 2009 and continues to eat lower amounts of fat. She likes salty and sweet foods and she feels that gets in the way of her eating healthy sometimes. B: protein shake: almond milk, strawberris, banana, protein powder  or oikos yogurt with granola L: 1/2 sandwich with fries or chips D: meat (chicken mostly) and salad or a chicken salad. She uses olive oil, butter, and sometimes bacon in her green beans. Doesn't eat many fried foods. Her weight is stable. She reports that if her husband and her were to go out to the movies they would split popcorn and candy, discussed maybe having some popcorn and then bringing some peanuts - the extra fiber, fat, and protein with help to balance this snack/meal better so it is less carbohydrates and she has a better blood sugar response as well as increasing her protein intake. Discussed heart healthy/T2DM eating as well as pulmonary MNT.  Intervention Plan   Intervention Prescribe, educate and counsel regarding individualized specific dietary modifications aiming towards targeted core components such as weight, hypertension, lipid management, diabetes, heart failure and other comorbidities.    Expected Outcomes Short Term Goal: Understand basic principles of dietary content, such as calories, fat, sodium, cholesterol  and nutrients.;Short Term Goal: A plan has been developed with personal nutrition goals set during dietitian appointment.;Long Term Goal: Adherence to prescribed nutrition plan.             Nutrition Discharge:   Education Questionnaire Score:  Knowledge Questionnaire Score - 10/30/21 1010       Knowledge Questionnaire Score   Post Score 17/18             Goals reviewed with patient; copy given to patient.

## 2021-11-03 NOTE — Progress Notes (Signed)
Daily Session Note ? ?Patient Details  ?Name: Betty [name] ?MRN: [id number] ?Date of Birth: 1946/05/12 ?Referring Provider:   ?Flowsheet Row Pulmonary Rehab from 05/18/2021 in [facility] Pulmonary Re[facility]  ?Referring Provider [name]  ? ?  ? ? ?Encounter Date: 11/03/2021 ? ?Check In: ? Session Check In - 11/03/21 0958   ? ?  ? Check-In  ? Supervising physician immediately available to respond to emergencies See telemetry face sheet for immediately available ER MD   ? Location [facility]-Cardiac & Pulmonary Rehab   ? Staff Present Renita Papa, RN BSN;Joseph Marmarth, RCP,RRT,BSRT;Jessica [place], [place], [place], [place], CCET   ? Virtual Visit No   ? Medication changes reported     No   ? Fall or balance concerns reported    No   ? Warm-up and Cool-down Performed on first and last piece of equipment   ? Resistance Training Performed Yes   ? VAD Patient? No   ? PAD/SET Patient? No   ?  ? Pain Assessment  ? Currently in Pain? No/denies   ? ?  ?  ? ?  ? ? ? ? ? ?Social History  ? ?Tobacco Use  ?Smoking Status Never  ?Smokeless Tobacco Never  ? ? ?Goals Met:  ?Independence with exercise equipment ?Exercise tolerated well ?No report of concerns or symptoms today ?Strength training completed today ? ?Goals Unmet:  ?Not Applicable ? ?Comments:  Betty [name] graduated today from  rehab with 36 sessions completed.  Details of the patient's exercise prescription and what She needs to do in order to continue the prescription and progress were discussed with patient.  Patient was given a copy of prescription and goals.  Patient verbalized understanding.  Betty [name] plans to continue to exercise by walking. ? ? ? ?Dr. Emily Filbert is Medical Director for [facility].  ?Dr. Ottie Glazier is Medical Director for [facility] Pulmonary Rehabilitation. ?

## 2021-11-10 ENCOUNTER — Telehealth: Payer: Self-pay | Admitting: Emergency Medicine

## 2021-11-10 DIAGNOSIS — G4733 Obstructive sleep apnea (adult) (pediatric): Secondary | ICD-10-CM

## 2021-11-10 NOTE — Telephone Encounter (Signed)
I need her to bring in Kenneth card before we can order a new machine

## 2021-11-10 NOTE — Telephone Encounter (Signed)
Called and spoke with patient. She verbalized understanding. She stated that she would like for the cpap order to go to [facility] instead of [facility] since she is already getting her oxygen from them. I advised her that I would go ahead and place the order for her.  ? ?Nothing further needed at time of call.  ?

## 2021-11-10 NOTE — Telephone Encounter (Signed)
Can you pull a download, if more than 70% compliant with use ok to place order at current pressure setting. Tell her she can continue to use machine

## 2021-11-10 NOTE — Telephone Encounter (Signed)
Called and spoke with pt who states a message has popped up on her CPAP machine stating that the motor has exceeded life expectancy. Asked pt how old her current machine was and she said that it was at least 75 years old.  Stated to pt that we would send an order to [facility] for her to receive a new machine and she verbalized understanding. ? ?Beth, you did see pt prior 10/2019. Please advise if we can place order under you with RB being off today. ?

## 2021-11-10 NOTE — Telephone Encounter (Signed)
Called [facility] and they stated that she receives her O2 from them but not the cpap machine.  ? ?Called [facility] and was hold on for over 10 mins. Was advised that they are not able [place] with a download nor look into her account to see what the last documented pressure setting was.  ? ?I looked through her chart for a pressure setting and found that the machine was set at 4cm.  ?

## 2021-11-10 NOTE — Telephone Encounter (Signed)
Ok that's fine, she will need visit 30 days after getting new machine to review compliance and settings

## 2021-11-10 NOTE — Telephone Encounter (Signed)
Per Betty [name] when she called and spoke with [facility] they told her that her machine is to old and there is no SD card and that they are not able to access what the settings are. I think she has a Resmed V8 VPAP maybe and if that is the case we will just need to place new order and use settings that are based off office notes.  ?

## 2021-11-14 ENCOUNTER — Ambulatory Visit: Payer: Medicare PPO | Admitting: Family Medicine

## 2021-11-14 ENCOUNTER — Other Ambulatory Visit: Payer: Self-pay

## 2021-11-14 ENCOUNTER — Encounter: Payer: Self-pay | Admitting: Family Medicine

## 2021-11-14 DIAGNOSIS — E1121 Type 2 diabetes mellitus with diabetic nephropathy: Secondary | ICD-10-CM | POA: Diagnosis not present

## 2021-11-14 DIAGNOSIS — R1011 Right upper quadrant pain: Secondary | ICD-10-CM | POA: Insufficient documentation

## 2021-11-14 DIAGNOSIS — H6063 Unspecified chronic otitis externa, bilateral: Secondary | ICD-10-CM

## 2021-11-14 DIAGNOSIS — H6090 Unspecified otitis externa, unspecified ear: Secondary | ICD-10-CM | POA: Insufficient documentation

## 2021-11-14 DIAGNOSIS — I5032 Chronic diastolic (congestive) heart failure: Secondary | ICD-10-CM

## 2021-11-14 DIAGNOSIS — R609 Edema, unspecified: Secondary | ICD-10-CM | POA: Diagnosis not present

## 2021-11-14 DIAGNOSIS — E782 Mixed hyperlipidemia: Secondary | ICD-10-CM | POA: Diagnosis not present

## 2021-11-14 DIAGNOSIS — R7989 Other specified abnormal findings of blood chemistry: Secondary | ICD-10-CM | POA: Diagnosis not present

## 2021-11-14 DIAGNOSIS — L299 Pruritus, unspecified: Secondary | ICD-10-CM | POA: Diagnosis not present

## 2021-11-14 LAB — LDL CHOLESTEROL, DIRECT: Direct LDL: 87 mg/dL

## 2021-11-14 LAB — COMPREHENSIVE METABOLIC PANEL
ALT: 15 U/L (ref 0–35)
AST: 17 U/L (ref 0–37)
Albumin: 4.1 g/dL (ref 3.5–5.2)
Alkaline Phosphatase: 87 U/L (ref 39–117)
BUN: 15 mg/dL (ref 6–23)
CO2: 32 mEq/L (ref 19–32)
Calcium: 9.9 mg/dL (ref 8.4–10.5)
Chloride: 98 mEq/L (ref 96–112)
Creatinine, Ser: 0.61 mg/dL (ref 0.40–1.20)
GFR: 87.86 mL/min (ref >60.00)
Glucose, Bld: 175 mg/dL — ABNORMAL HIGH (ref 70–99)
Potassium: 4 mEq/L (ref 3.5–5.1)
Sodium: 141 mEq/L (ref 135–145)
Total Bilirubin: 0.5 mg/dL (ref 0.2–1.2)
Total Protein: 7.2 g/dL (ref 6.0–8.3)

## 2021-11-14 LAB — LIPID PANEL
Cholesterol: 190 mg/dL (ref 0–200)
HDL: 70.4 mg/dL (ref >39.00)
NonHDL: 119.38
Total CHOL/HDL Ratio: 3
Triglycerides: 366 mg/dL — ABNORMAL HIGH (ref 0.0–149.0)
VLDL: 73.2 mg/dL — ABNORMAL HIGH (ref 0.0–40.0)

## 2021-11-14 LAB — HEMOGLOBIN A1C: Hgb A1c MFr Bld: 6.9 % — ABNORMAL HIGH (ref 4.6–6.5)

## 2021-11-14 LAB — T4, FREE: Free T4: 0.8 ng/dL (ref 0.60–1.60)

## 2021-11-14 LAB — TSH: TSH: 4.75 u[IU]/mL (ref 0.35–5.50)

## 2021-11-14 LAB — CBC
HCT: 41 % (ref 36.0–46.0)
Hemoglobin: 13.9 g/dL (ref 12.0–15.0)
MCHC: 33.9 g/dL (ref 30.0–36.0)
MCV: 98.5 fl (ref 78.0–100.0)
Platelets: 237 10*3/uL (ref 150.0–400.0)
RBC: 4.16 Mil/uL (ref 3.87–5.11)
RDW: 12.5 % (ref 11.5–15.5)
WBC: 6.7 10*3/uL (ref 4.0–10.5)

## 2021-11-14 LAB — BRAIN NATRIURETIC PEPTIDE: Pro B Natriuretic peptide (BNP): 17 pg/mL (ref 0.0–100.0)

## 2021-11-14 MED ORDER — ACETIC ACID 2 % OT SOLN: OTIC | 0 refills | Status: DC

## 2021-11-14 NOTE — Assessment & Plan Note (Signed)
Patient has chronic issues with apparent otitis externa.  No evidence of this on exam today.  Discussed she could try stopping the acetic acid drops and see how it goes.  Discussed that I did not think there was any significant adverse effects from long-term use though if she does not need them she should not use them. ?

## 2021-11-14 NOTE — Assessment & Plan Note (Signed)
Recheck labs 

## 2021-11-14 NOTE — Patient Instructions (Signed)
Nice to see you. We will get lab work today and contact you with the results. 

## 2021-11-14 NOTE — Assessment & Plan Note (Signed)
The pulling sensation may represent a pulled muscle.  She has a benign exam.  We will check a hepatic function panel to help dictate the next step in management.  Advised if she develops severe discomfort she needs to seek medical attention immediately. ?

## 2021-11-14 NOTE — Assessment & Plan Note (Signed)
Continue pravastatin 20 mg daily.  Check lipid panel. ?

## 2021-11-14 NOTE — Progress Notes (Signed)
?Tommi Rumps, MD ?Phone: [phone] ? ?Betty [name] is a 75 y.o. female who presents today for f/u. ? ?DIABETES ?Disease Monitoring: ?Blood Sugar ranges-not checking Polyuria/phagia/dipsia- no      Optho- UTD ?Medications: ?Compliance- taking metformin XR 500 mg BID Hypoglycemic symptoms- no ? ?HYPERLIPIDEMIA ?Symptoms ?Chest pain on exertion:  no   Leg claudication:   no ?Medications: ?Compliance- taking pravastatin Right upper quadrant pain- see below  Muscle aches- no ?Lipid Panel  ?   ?Component Value Date/Time  ? CHOL 206 (H) 08/15/2020 0846  ? TRIG 217.0 (H) 08/15/2020 0846  ? HDL 70.60 08/15/2020 0846  ? CHOLHDL 3 08/15/2020 0846  ? VLDL 43.4 (H) 08/15/2020 0846  ? [place] 58 04/30/2017 1048  ? LDLDIRECT 63.0 04/11/2021 0943  ? ?Right upper quadrant discomfort: Patient notes over the last week she has felt a stretching sensation in her right upper quadrant.  She notes it comes and goes.  There was no injury.  No vomiting or nausea.  No precipitating factors. ? ?Elevated TSH: She notes she remains fatigued chronically.  No skin changes.  Notes she is somewhat intolerant to heat. ? ?CHF: Patient is on carvedilol and losartan.  She has chronic dyspnea that is stable.  She has mild bilateral lower extremity edema.  She has some trouble breathing when laying down though only sleeps on 1 pillow.  No PND.  She reports she graduated from pulmonary rehab.  Patient reports she has been on a diuretic in the past. ? ?External ear irritation: Patient has been using acetic acid drops daily.  She feels as though this has been beneficial.  She has not tried stopping these. ? ? ?Social History  ? ?Tobacco Use  ?Smoking Status Never  ?Smokeless Tobacco Never  ? ? ?Current Outpatient Medications on File Prior to Visit  ?Medication Sig Dispense Refill  ? albuterol (VENTOLIN HFA) 108 (90 Base) MCG/ACT inhaler Inhale 2 puffs into the lungs every 4 (four) hours as needed for wheezing or shortness of breath. 8 g 6  ?  aspirin 81 MG EC tablet Take 1 tablet (81 mg total) by mouth daily. 90 tablet 2  ? CALCIUM-MAGNESIUM-VITAMIN D PO Take 1 tablet by mouth daily.    ? carvedilol (COREG) 12.5 MG tablet TAKE 1 TABLET BY MOUTH TWICE A DAY WITH MEALS 60 tablet 11  ? clobetasol (TEMOVATE) 0.05 % GEL Apply topically 2 (two) times daily.    ? Coenzyme Q10 (CO Q 10 PO) Take 200 mg by mouth at bedtime.     ? Colchicine (MITIGARE) 0.6 MG CAPS Day 1: take 1.2 mg (2 tablets) by mouth at the first sign of flare, followed by 0.6 mg (1 tablet) by mouth after 1 hour. Day 2 and thereafter: take 0.6 mg (1 tablet) by mouth daily until flare has resolved. 30 capsule 0  ? Fish Oil OIL Take 1,200 mg by mouth 2 (two) times daily. GUMMIES    ? fluticasone (FLONASE) 50 MCG/ACT nasal spray Place 2 sprays into both nostrils daily. 16 g 6  ? losartan (COZAAR) 50 MG tablet TAKE 1/2 TABLET BY MOUTH DAILY 45 tablet 3  ? metFORMIN (GLUCOPHAGE XR) 500 MG 24 hr tablet Take 2 tablets (1,000 mg total) by mouth 2 (two) times daily with a meal. (Patient taking differently: Take 500 mg by mouth 2 (two) times daily with a meal.) 360 tablet 1  ? multivitamin (THERAGRAN) per tablet Take 1 tablet by mouth daily.    ? nystatin cream (MYCOSTATIN)  Apply 1 application topically 2 (two) times daily. 30 g 0  ? polyvinyl alcohol (LIQUIFILM TEARS) 1.4 % ophthalmic solution Place 1 drop into both eyes every morning.    ? pravastatin (PRAVACHOL) 20 MG tablet TAKE 1 TABLET BY MOUTH EVERY DAY 90 tablet 1  ? ?No current facility-administered medications on file prior to visit.  ? ? ? ?ROS see history of present illness ? ?Objective ? ?Physical Exam ?Vitals:  ? 11/14/21 1033  ?BP: 130/70  ?Pulse: (!) 103  ?Temp: 97.9 ?F (36.6 ?C)  ?SpO2: 90%  ? ? ?BP Readings from Last 3 Encounters:  ?11/14/21 130/70  ?10/10/21 124/68  ?05/17/21 110/70  ? ?Wt Readings from Last 3 Encounters:  ?11/14/21 201 lb (91.2 kg)  ?10/27/21 197 lb 9.6 oz (89.6 kg)  ?10/10/21 198 lb 9.6 oz (90.1 kg)  ? ? ?Physical  Exam ?Constitutional:   ?   General: She is not in acute distress. ?   Appearance: She is not diaphoretic.  ?HENT:  ?   Right Ear: Tympanic membrane and ear canal normal.  ?   Left Ear: Tympanic membrane and ear canal normal.  ?Cardiovascular:  ?   Rate and Rhythm: Normal rate and regular rhythm.  ?   Heart sounds: Normal heart sounds.  ?Pulmonary:  ?   Effort: Pulmonary effort is normal.  ?   Breath sounds: Normal breath sounds.  ?Abdominal:  ?   General: Bowel sounds are normal. There is no distension.  ?   Palpations: Abdomen is soft.  ?   Tenderness: There is no abdominal tenderness.  ?Musculoskeletal:  ?   Comments: Trace pitting edema bilateral lower extremities  ?Skin: ?   General: Skin is warm and dry.  ?Neurological:  ?   Mental Status: She is alert.  ? ? ? ?Assessment/Plan: Please see individual problem list. ? ?Problem List Items Addressed This Visit   ? ? Chronic diastolic heart failure (HCC) (Chronic)  ?  Generally stable with regards to breathing.  She does have some swelling which could be related to this.  We will check a BNP.  She will continue carvedilol 12.5 mg twice daily and losartan 25 mg once daily. ?  ?  ? Relevant Orders  ? B Nat Peptide  ? Diabetes mellitus type 2, controlled ([place]) (Chronic)  ?  Check A1c.  She will continue metformin XR 500 mg twice daily. ?  ?  ? Relevant Orders  ? HgB A1c  ? Abdominal discomfort in right upper quadrant  ?  The pulling sensation may represent a pulled muscle.  She has a benign exam.  We will check a hepatic function panel to help dictate the next step in management.  Advised if she develops severe discomfort she needs to seek medical attention immediately. ?  ?  ? Edema  ?  Possibly venous insufficiency versus heart failure related.  We are checking additional labs to evaluate for other causes. ?  ?  ? Relevant Orders  ? CBC  ? Elevated TSH  ?  Recheck labs. ?  ?  ? Relevant Orders  ? T4, free  ? TSH  ? HYPERLIPIDEMIA, MIXED  ?  Continue pravastatin 20 mg  daily.  Check lipid panel. ?  ?  ? Relevant Orders  ? Comp Met (CMET)  ? Lipid panel  ? Otitis externa  ?  Patient has chronic issues with apparent otitis externa.  No evidence of this on exam today.  Discussed she could try stopping  the acetic acid drops and see how it goes.  Discussed that I did not think there was any significant adverse effects from long-term use though if she does not need them she should not use them. ?  ?  ? ?Other Visit Diagnoses   ? ? Ear itching      ? Relevant Medications  ? acetic acid 2 % otic solution  ? ?  ? ? ? ?Health Maintenance: Requesting ophthalmology records. ? ?Return in about 6 months (around 05/17/2022) for Diabetes/CHF. ? ?This visit occurred during the SARS-CoV-2 public health emergency.  Safety protocols were in place, including screening questions prior to the visit, additional usage of staff PPE, and extensive cleaning of exam room while observing appropriate contact time as indicated for disinfecting solutions.  ? ? ?Tommi Rumps, MD ?[place] ? ?

## 2021-11-14 NOTE — Assessment & Plan Note (Signed)
Check A1c.  She will continue metformin XR 500 mg twice daily. ?

## 2021-11-14 NOTE — Assessment & Plan Note (Signed)
Possibly venous insufficiency versus heart failure related.  We are checking additional labs to evaluate for other causes. ?

## 2021-11-14 NOTE — Assessment & Plan Note (Signed)
Generally stable with regards to breathing.  She does have some swelling which could be related to this.  We will check a BNP.  She will continue carvedilol 12.5 mg twice daily and losartan 25 mg once daily. ?

## 2021-11-16 ENCOUNTER — Other Ambulatory Visit: Payer: Self-pay | Admitting: Family Medicine

## 2021-11-16 ENCOUNTER — Telehealth: Payer: Self-pay

## 2021-11-16 DIAGNOSIS — E782 Mixed hyperlipidemia: Secondary | ICD-10-CM

## 2021-11-16 MED ORDER — PRAVASTATIN SODIUM 40 MG PO TABS: 40.0000 mg | ORAL_TABLET | Freq: Every day | ORAL | 1 refills | Status: DC

## 2021-11-16 NOTE — Telephone Encounter (Signed)
Lvm for pt to return call in regards to new prescription that was sent in to pharmacy and to schedule lab appt in 6 wks. ? ?Per [id number]: ?Pravastatin 40 mg once daily sent to the pharmacy. She needs labs in 6 weeks orders placed. If she develops any muscle or joint pain at this dose she needs to let us know.  ?

## 2021-11-23 ENCOUNTER — Other Ambulatory Visit: Payer: Self-pay | Admitting: Family Medicine

## 2021-11-23 DIAGNOSIS — E1121 Type 2 diabetes mellitus with diabetic nephropathy: Secondary | ICD-10-CM

## 2021-12-02 DIAGNOSIS — J9610 Chronic respiratory failure, unspecified whether with hypoxia or hypercapnia: Secondary | ICD-10-CM | POA: Diagnosis not present

## 2021-12-22 ENCOUNTER — Other Ambulatory Visit: Payer: Self-pay | Admitting: Family Medicine

## 2021-12-28 ENCOUNTER — Other Ambulatory Visit (INDEPENDENT_AMBULATORY_CARE_PROVIDER_SITE_OTHER): Payer: Medicare PPO

## 2021-12-28 DIAGNOSIS — E782 Mixed hyperlipidemia: Secondary | ICD-10-CM | POA: Diagnosis not present

## 2021-12-28 DIAGNOSIS — Z0100 Encounter for examination of eyes and vision without abnormal findings: Secondary | ICD-10-CM | POA: Diagnosis not present

## 2021-12-28 DIAGNOSIS — H26492 Other secondary cataract, left eye: Secondary | ICD-10-CM | POA: Diagnosis not present

## 2021-12-28 LAB — LDL CHOLESTEROL, DIRECT: Direct LDL: 85 mg/dL

## 2021-12-28 LAB — HEPATIC FUNCTION PANEL
ALT: 14 U/L (ref 0–35)
AST: 15 U/L (ref 0–37)
Albumin: 4.1 g/dL (ref 3.5–5.2)
Alkaline Phosphatase: 75 U/L (ref 39–117)
Bilirubin, Direct: 0.1 mg/dL (ref 0.0–0.3)
Total Bilirubin: 0.4 mg/dL (ref 0.2–1.2)
Total Protein: 7.3 g/dL (ref 6.0–8.3)

## 2021-12-28 LAB — HM DIABETES EYE EXAM

## 2022-01-01 DIAGNOSIS — J9610 Chronic respiratory failure, unspecified whether with hypoxia or hypercapnia: Secondary | ICD-10-CM | POA: Diagnosis not present

## 2022-01-02 ENCOUNTER — Telehealth: Payer: Self-pay | Admitting: Family Medicine

## 2022-01-02 DIAGNOSIS — E782 Mixed hyperlipidemia: Secondary | ICD-10-CM

## 2022-01-02 NOTE — Telephone Encounter (Signed)
Patient returned office phone for lab results. Note was read from Dr Caryl Bis. Patient stated she feels fine on pravastatin and no muscle aches. ?

## 2022-01-03 NOTE — Telephone Encounter (Signed)
Noted.  Would she be willing to increase the pravastatin to 80 mg once daily?  If she is willing to do this please send this in for her with 90 tablets and 1 refill.  She would need a hepatic function panel and LDL in 6 weeks.  If she develops any muscle aches with this increased dose she should let us know. ?

## 2022-01-03 NOTE — Telephone Encounter (Signed)
LVM for patient to call back.   Shardea Cwynar,cma  

## 2022-01-03 NOTE — Telephone Encounter (Signed)
I called and spoke with the patient and she agreed on the 80 mg dose of the pravastatin and I sent it in to the pharmacy, she is also scheduled for a lab recheck in 6 weeks. Betty [name],cma  ?

## 2022-01-03 NOTE — Addendum Note (Signed)
Addended by: Fulton Mole D on: 01/03/2022 02:37 PM ? ? Modules accepted: Orders ? ?

## 2022-01-05 DIAGNOSIS — H26492 Other secondary cataract, left eye: Secondary | ICD-10-CM | POA: Diagnosis not present

## 2022-01-06 ENCOUNTER — Other Ambulatory Visit (HOSPITAL_COMMUNITY): Payer: Self-pay | Admitting: Cardiology

## 2022-01-17 ENCOUNTER — Other Ambulatory Visit: Payer: Self-pay | Admitting: Family Medicine

## 2022-01-17 ENCOUNTER — Encounter: Payer: Self-pay | Admitting: Family Medicine

## 2022-01-17 ENCOUNTER — Encounter: Payer: Self-pay | Admitting: Cardiology

## 2022-01-18 MED ORDER — PRAVASTATIN SODIUM 80 MG PO TABS: 80.0000 mg | ORAL_TABLET | Freq: Every day | ORAL | 3 refills | Status: DC

## 2022-01-31 ENCOUNTER — Telehealth: Payer: Self-pay | Admitting: Cardiology

## 2022-01-31 NOTE — Telephone Encounter (Signed)
Just FYI--Amanda with [facility] states they received an order for a new CPAP, but she states it has expired and patient will need a new appointment prior to receiving new CPAP. She states she plans to call the patient to inform her that she will need to schedule as well. If any questions, please return call to [place] at [phone].

## 2022-02-01 DIAGNOSIS — J9610 Chronic respiratory failure, unspecified whether with hypoxia or hypercapnia: Secondary | ICD-10-CM | POA: Diagnosis not present

## 2022-02-02 NOTE — Telephone Encounter (Signed)
Reached out to the patient and she only sees Dr Radford Pax for cardiology.

## 2022-02-05 ENCOUNTER — Ambulatory Visit: Payer: Medicare PPO | Admitting: Nurse Practitioner

## 2022-02-05 ENCOUNTER — Encounter: Payer: Self-pay | Admitting: Nurse Practitioner

## 2022-02-05 VITALS — BP 120/76 | HR 91 | Temp 98.0°F | Ht 65.0 in | Wt 201.0 lb

## 2022-02-05 DIAGNOSIS — J984 Other disorders of lung: Secondary | ICD-10-CM

## 2022-02-05 DIAGNOSIS — G4733 Obstructive sleep apnea (adult) (pediatric): Secondary | ICD-10-CM | POA: Diagnosis not present

## 2022-02-05 NOTE — Patient Instructions (Addendum)
Continue to use CPAP every night, minimum of 4-6 hours a night.  Change equipment every 30 days or as directed by DME. Wash your tubing with warm soap and water daily, hang to dry. Wash humidifier portion weekly.  Be aware of reduced alertness and do not drive or operate heavy machinery if experiencing this or drowsiness.  Exercise encouraged, as tolerated. Healthy weight management discussed.  Avoid or decrease alcohol consumption and medications that make you more sleepy, if possible. Notify if persistent daytime sleepiness occurs even with consistent use of CPAP.  Orders sent for new machine with 3 lpm supplemental oxygen bled through  Continue supplemental oxygen 3 lpm as needed with activity to maintain levels >88-90% and at night  Continue pulmonary rehab  Continue Albuterol inhaler 2 puffs every 6 hours as needed for shortness of breath or wheezing. Notify if symptoms persist despite rescue inhaler/neb use.  Follow up within 31-90 days after you receive your new CPAP machine with Dr. Lamonte Sakai or Alanson Aly for compliance check. If symptoms do not improve or worsen, please contact office for sooner follow up or seek emergency care.

## 2022-02-05 NOTE — Progress Notes (Signed)
$'@Patient'U$  ID: Betty [name], female    DOB: November 03, 1945, 76 y.o.   MRN: [id number]  Chief Complaint  Patient presents with   Follow-up    Pt states she has been doing okay since last visit and denies any complaints. States she is wearing her CPAP machine every night averaging about 7 hours a night.    Referring provider: Leone Haven, MD  HPI: 76 year old female, never smoker followed for OSA on CPAP, restrictive lung disease/ILD, chronic respiratory failure.  She is a patient Dr. Lamonte Sakai [facility]2 lassitude office 10/10/2021.  Past medical history significant for cardiomyopathy, pulmonary hypertension, CHF, allergic rhinitis, DM2, HLD, obesity.  TEST/EVENTS:  01/07/2020 PFTs: FVC 49, FEV1 55, ratio 86, TLC 88, DLCOcor 96 10/10/2021 CXR 2 view: Diffuse interstitial coarsening is present.  No acute process noted.  10/10/2021: OV with Dr. Lamonte Sakai.  Reported excellent compliance with CPAP.  Continue nightly.  CXR did not show any evolving changes from ILD.  Clinically stable and continues to receive good benefit from pulmonary rehab.  History of confirmed desaturations with exertion-continue 3 L/min with activity and at night. She has albuterol available if she needs it but has never used it.  02/05/2022: Today-follow-up Patient presents today for follow-up to discuss CPAP therapy.  She was notified that she needed to have a updated appointment regarding her CPAP therapy since she needs a new machine.  She continues to wear her CPAP nightly and receives good benefit.  She has never had a break in therapy.  She denies any excessive daytime fatigue, morning headaches, drowsy driving.  We have been unable to get downloads in the past given the age of her machine.  She is unsure if she has an SD card but is going to check when she gets home.  She also has supplemental oxygen at 3 L/min bled through her CPAP machine.  Breathing is stable without any concerns or complaints today.  Allergies  Allergen  Reactions   Amoxicillin Rash    REACTION: RASH   Etodolac Other (See Comments)    Bloody stool    Hydrochlorothiazide Other (See Comments)    Increases gout flare   Meloxicam Other (See Comments)    constipation   Sulfa Antibiotics Other (See Comments)    rash   Allopurinol    Metformin And Related     diarrhea   Rosuvastatin Other (See Comments)    "extreme joint pain"   Sulfonamide Derivatives     REACTION: RASH   Jardiance [Empagliflozin] Other (See Comments)    Yeast infection   Lasix [Furosemide] Other (See Comments)    Pt states "it dried everything out of me"    Immunization History  Administered Date(s) Administered   Fluad Quad(high Dose 65+) 04/30/2019, 05/17/2021   H1N1 09/08/2008   Influenza Split 06/28/2011, 05/01/2012   Influenza Whole 07/11/2007, 06/07/2008, 07/12/2009   Influenza, High Dose Seasonal PF 05/12/2018, 05/19/2020   Influenza,inj,Quad PF,6+ Mos 04/23/2014, 04/21/2015   Influenza-Unspecified 04/27/2013, 05/04/2016, 04/24/2017   PFIZER(Purple Top)SARS-COV-2 Vaccination 09/10/2019, 10/01/2019, 05/30/2020, 12/23/2020   Pneumococcal Conjugate-13 01/21/2014   Pneumococcal Polysaccharide-23 08/28/2011, 03/06/2012   Td 10/03/2007, 10/14/2013   Tdap 10/11/2013   Zoster Recombinat (Shingrix) 04/13/2019, 05/18/2019, 07/16/2019   Zoster, Live 06/21/2010    Past Medical History:  Diagnosis Date   Allergic rhinitis    never tested, fall and spring   Arthritis    hands,knees, feet   Cataracts, bilateral    not surgical yet   CHOLECYSTECTOMY, HX OF 10/08/2007  Qualifier: Diagnosis of  By: Council Mechanic MD, [name]    Chronic diastolic CHF (congestive heart failure) ([place])    Chronic respiratory failure ([place])    Diabetes mellitus without complication ([employer])    Diverticulosis    problems with frequent gas, burping   Glucose intolerance (impaired glucose tolerance)    Gout    History of chicken pox    Hyperlipidemia    Hypertension    NICM  (nonischemic cardiomyopathy) ([place]) 2002   EF 25%; improved to normal - echo 4/08: EF 50-55%, mild MR, mild LAE, mild TR;    cath 3/03: normal cors, EF 40%   Obesity    Oxygen deficiency 2017   at night   PONV (postoperative nausea and vomiting)    after wisdom tooth extraction   Restrictive lung disease    Skin cancer    skin cancers only   Sleep apnea    cpap settings 4    Tobacco History: Social History   Tobacco Use  Smoking Status Never  Smokeless Tobacco Never   Counseling given: Not Answered   Outpatient Medications Prior to Visit  Medication Sig Dispense Refill   acetic acid 2 % otic solution Insert saturated wick of cotton; keep moist 24 hours by adding 3 to 5 drops every 4 to 6 hours; remove wick after 24 hours and instill 5 drops 3 to 4 times daily 15 mL 0   albuterol (VENTOLIN HFA) 108 (90 Base) MCG/ACT inhaler Inhale 2 puffs into the lungs every 4 (four) hours as needed for wheezing or shortness of breath. 8 g 6   aspirin 81 MG EC tablet Take 1 tablet (81 mg total) by mouth daily. 90 tablet 2   CALCIUM-MAGNESIUM-VITAMIN D PO Take 1 tablet by mouth daily.     carvedilol (COREG) 12.5 MG tablet TAKE 1 TABLET BY MOUTH TWICE A DAY WITH MEALS Need to call and schedule overdue appointment for further refills 1st attempt 60 tablet 0   clobetasol (TEMOVATE) 0.05 % GEL Apply topically once a week.     Coenzyme Q10 (CO Q 10 PO) Take 200 mg by mouth at bedtime.      Colchicine (MITIGARE) 0.6 MG CAPS Day 1: take 1.2 mg (2 tablets) by mouth at the first sign of flare, followed by 0.6 mg (1 tablet) by mouth after 1 hour. Day 2 and thereafter: take 0.6 mg (1 tablet) by mouth daily until flare has resolved. 30 capsule 0   Fish Oil OIL Take 1,200 mg by mouth 2 (two) times daily. GUMMIES     fluticasone (FLONASE) 50 MCG/ACT nasal spray SPRAY 2 SPRAYS INTO EACH NOSTRIL EVERY DAY 48 mL 2   losartan (COZAAR) 50 MG tablet TAKE 1/2 TABLET BY MOUTH EVERY DAY 45 tablet 3   metFORMIN  (GLUCOPHAGE-XR) 500 MG 24 hr tablet TAKE 2 TABLETS (1,000 MG TOTAL) BY MOUTH 2 (TWO) TIMES DAILY WITH A MEAL. 360 tablet 1   multivitamin (THERAGRAN) per tablet Take 1 tablet by mouth daily.     nystatin cream (MYCOSTATIN) Apply 1 application topically 2 (two) times daily. 30 g 0   polyvinyl alcohol (LIQUIFILM TEARS) 1.4 % ophthalmic solution Place 1 drop into both eyes every morning.     pravastatin (PRAVACHOL) 80 MG tablet Take 1 tablet (80 mg total) by mouth daily. 90 tablet 3   No facility-administered medications prior to visit.     Review of Systems:   Constitutional: No weight loss or gain, night sweats, fevers, chills, fatigue,  or lassitude. HEENT: No headaches, difficulty swallowing, tooth/dental problems, or sore throat. No sneezing, itching, ear ache, nasal congestion, or post nasal drip CV:  No chest pain, orthopnea, PND, swelling in lower extremities, anasarca, dizziness, palpitations, syncope Resp: +shortness of breath with exertion (baseline). No excess mucus or change in color of mucus. No productive or non-productive. No hemoptysis. No wheezing.  No chest wall deformity Skin: No rash, lesions, ulcerations MSK:  No joint pain or swelling.  No decreased range of motion.  No back pain. Neuro: No dizziness or lightheadedness.  Psych: No depression or anxiety. Mood stable.     Physical Exam:  BP 120/76 (BP Location: Right Wrist, Patient Position: Sitting, Cuff Size: Normal)   Pulse 91   Temp 98 F (36.7 C) (Oral)   Ht '5\' 5"'$  (1.651 m)   Wt 201 lb (91.2 kg)   SpO2 93% Comment: RA  BMI 33.45 kg/m   GEN: Pleasant, interactive, well-appearing; in no acute distress. HEENT:  Normocephalic and atraumatic. PERRLA. Sclera white. Nasal turbinates pink, moist and patent bilaterally. No rhinorrhea present. Oropharynx pink and moist, without exudate or edema. No lesions, ulcerations, or postnasal drip.  NECK:  Supple w/ fair ROM. No JVD present. Normal carotid impulses w/o bruits.  Thyroid symmetrical with no goiter or nodules palpated. No lymphadenopathy.   CV: RRR, no m/r/g, no peripheral edema. Pulses intact, +2 bilaterally. No cyanosis, pallor or clubbing. PULMONARY:  Unlabored, regular breathing. Clear bilaterally A&P w/o wheezes/rales/rhonchi. No accessory muscle use. No dullness to percussion. GI: BS present and normoactive. Soft, non-tender to palpation. No organomegaly or masses detected. No CVA tenderness. MSK: No erythema, warmth or tenderness. Cap refil <2 sec all extrem. No deformities or joint swelling noted.  Neuro: A/Ox3. No focal deficits noted.   Skin: Warm, no lesions or rashe Psych: Normal affect and behavior. Judgement and thought content appropriate.     Lab Results:  CBC    Component Value Date/Time   WBC 6.7 11/14/2021 1046   RBC 4.16 11/14/2021 1046   HGB 13.9 11/14/2021 1046   HCT 41.0 11/14/2021 1046   PLT 237.0 11/14/2021 1046   MCV 98.5 11/14/2021 1046   MCH 32.5 10/19/2019 1214   MCHC 33.9 11/14/2021 1046   RDW 12.5 11/14/2021 1046   LYMPHSABS 2.3 01/27/2016 1024   MONOABS 0.7 01/27/2016 1024   EOSABS 0.1 01/27/2016 1024   BASOSABS 0.0 01/27/2016 1024    BMET    Component Value Date/Time   NA 141 11/14/2021 1046   NA 139 03/09/2019 1058   K 4.0 11/14/2021 1046   CL 98 11/14/2021 1046   CO2 32 11/14/2021 1046   GLUCOSE 175 (H) 11/14/2021 1046   BUN 15 11/14/2021 1046   BUN 16 03/09/2019 1058   CREATININE 0.61 11/14/2021 1046   CREATININE 0.64 01/08/2014 1657   CALCIUM 9.9 11/14/2021 1046   GFRNONAA >60 10/19/2019 1214   GFRNONAA >89 01/08/2014 1657   GFRAA >60 10/19/2019 1214   GFRAA >89 01/08/2014 1657    BNP    Component Value Date/Time   BNP 28.0 10/19/2019 1214     Imaging:  No results found.       Latest Ref Rng & Units 01/07/2020    2:46 PM 11/21/2016    9:52 AM  PFT Results  FVC-Pre L 1.50  1.74   FVC-Predicted Pre % 49  55   FVC-Post L 1.33  1.76   FVC-Predicted Post % 43  56   Pre  FEV1/FVC % %  86  84   Post FEV1/FCV % % 86  85   FEV1-Pre L 1.28  1.45   FEV1-Predicted Pre % 55  61   FEV1-Post L 1.14  1.49   DLCO uncorrected ml/min/mmHg 19.28  16.09   DLCO UNC% % 96  62   DLCO corrected ml/min/mmHg 19.28  15.89   DLCO COR %Predicted % 96  62   DLVA Predicted % 176  104   TLC L 4.61  3.19   TLC % Predicted % 88  61   RV % Predicted % 137  83     No results found for: "NITRICOXIDE"      Assessment & Plan:   Obstructive sleep apnea Excellent compliance per patient and continues to receive good benefit. Her machine is quite old. Previous orders were sent for a new machine but she was told she needed an updated office visit. She is going to check when she gets home to see if her machine has an SD card in it for download. Orders sent for new CPAP auto 5-15 cmH2O today.   Patient Instructions  Continue to use CPAP every night, minimum of 4-6 hours a night.  Change equipment every 30 days or as directed by DME. Wash your tubing with warm soap and water daily, hang to dry. Wash humidifier portion weekly.  Be aware of reduced alertness and do not drive or operate heavy machinery if experiencing this or drowsiness.  Exercise encouraged, as tolerated. Healthy weight management discussed.  Avoid or decrease alcohol consumption and medications that make you more sleepy, if possible. Notify if persistent daytime sleepiness occurs even with consistent use of CPAP.  Orders sent for new machine with 3 lpm supplemental oxygen bled through  Continue supplemental oxygen 3 lpm as needed with activity to maintain levels >88-90% and at night  Continue pulmonary rehab  Continue Albuterol inhaler 2 puffs every 6 hours as needed for shortness of breath or wheezing. Notify if symptoms persist despite rescue inhaler/neb use.  Follow up within 31-90 days after you receive your new CPAP machine with Dr. Lamonte Sakai or Alanson Aly for compliance check. If symptoms do not improve or worsen,  please contact office for sooner follow up or seek emergency care.     Restrictive lung disease Clinically stable. Continue prn albuterol. Continue supplemental oxygen 3 lpm with activity and at night to maintain oxygen >88-90%   I spent 28 minutes of dedicated to the care of this patient on the date of this encounter to include pre-visit review of records, face-to-face time with the patient discussing conditions above, post visit ordering of testing, clinical documentation with the electronic health record, making appropriate referrals as documented, and communicating necessary findings to members of the patients care team.  Clayton Bibles, NP 02/05/2022  Pt aware and understands NP's role.

## 2022-02-05 NOTE — Assessment & Plan Note (Signed)
Excellent compliance per patient and continues to receive good benefit. Her machine is quite old. Previous orders were sent for a new machine but she was told she needed an updated office visit. She is going to check when she gets home to see if her machine has an SD card in it for download. Orders sent for new CPAP auto 5-15 cmH2O today.   Patient Instructions  Continue to use CPAP every night, minimum of 4-6 hours a night.  Change equipment every 30 days or as directed by DME. Wash your tubing with warm soap and water daily, hang to dry. Wash humidifier portion weekly.  Be aware of reduced alertness and do not drive or operate heavy machinery if experiencing this or drowsiness.  Exercise encouraged, as tolerated. Healthy weight management discussed.  Avoid or decrease alcohol consumption and medications that make you more sleepy, if possible. Notify if persistent daytime sleepiness occurs even with consistent use of CPAP.  Orders sent for new machine with 3 lpm supplemental oxygen bled through  Continue supplemental oxygen 3 lpm as needed with activity to maintain levels >88-90% and at night  Continue pulmonary rehab  Continue Albuterol inhaler 2 puffs every 6 hours as needed for shortness of breath or wheezing. Notify if symptoms persist despite rescue inhaler/neb use.  Follow up within 31-90 days after you receive your new CPAP machine with Dr. Lamonte Sakai or Alanson Aly for compliance check. If symptoms do not improve or worsen, please contact office for sooner follow up or seek emergency care.

## 2022-02-05 NOTE — Assessment & Plan Note (Addendum)
Clinically stable. Continue prn albuterol. Continue supplemental oxygen 3 lpm with activity and at night to maintain oxygen >88-90%

## 2022-02-06 ENCOUNTER — Telehealth: Payer: Self-pay | Admitting: Nurse Practitioner

## 2022-02-06 ENCOUNTER — Other Ambulatory Visit: Payer: Self-pay | Admitting: Family Medicine

## 2022-02-06 DIAGNOSIS — Z1231 Encounter for screening mammogram for malignant neoplasm of breast: Secondary | ICD-10-CM

## 2022-02-08 ENCOUNTER — Other Ambulatory Visit (HOSPITAL_COMMUNITY): Payer: Self-pay | Admitting: Cardiology

## 2022-02-08 NOTE — Telephone Encounter (Signed)
Called patient and clarified that she did not see a slot for an SD card on her cpap machine. I advised her I would get in contact with her DME company to see if we can get her a card sent out. Patient verbalized understanding and had no questions noted. Nothing further needed

## 2022-02-16 ENCOUNTER — Other Ambulatory Visit (HOSPITAL_COMMUNITY): Payer: Self-pay | Admitting: Cardiology

## 2022-02-19 ENCOUNTER — Other Ambulatory Visit (INDEPENDENT_AMBULATORY_CARE_PROVIDER_SITE_OTHER): Payer: Medicare PPO

## 2022-02-19 DIAGNOSIS — E782 Mixed hyperlipidemia: Secondary | ICD-10-CM | POA: Diagnosis not present

## 2022-02-19 LAB — HEPATIC FUNCTION PANEL
ALT: 15 U/L (ref 0–35)
AST: 16 U/L (ref 0–37)
Albumin: 3.9 g/dL (ref 3.5–5.2)
Alkaline Phosphatase: 74 U/L (ref 39–117)
Bilirubin, Direct: 0.1 mg/dL (ref 0.0–0.3)
Total Bilirubin: 0.4 mg/dL (ref 0.2–1.2)
Total Protein: 7.1 g/dL (ref 6.0–8.3)

## 2022-02-19 LAB — LDL CHOLESTEROL, DIRECT: Direct LDL: 73 mg/dL

## 2022-02-20 MED ORDER — EZETIMIBE 10 MG PO TABS: 10.0000 mg | ORAL_TABLET | Freq: Every day | ORAL | 1 refills | Status: DC

## 2022-02-21 ENCOUNTER — Other Ambulatory Visit: Payer: Self-pay | Admitting: Family Medicine

## 2022-02-21 DIAGNOSIS — E785 Hyperlipidemia, unspecified: Secondary | ICD-10-CM

## 2022-03-03 DIAGNOSIS — J9610 Chronic respiratory failure, unspecified whether with hypoxia or hypercapnia: Secondary | ICD-10-CM | POA: Diagnosis not present

## 2022-03-07 ENCOUNTER — Other Ambulatory Visit (HOSPITAL_COMMUNITY): Payer: Self-pay | Admitting: Cardiology

## 2022-03-07 ENCOUNTER — Encounter: Payer: Self-pay | Admitting: Family Medicine

## 2022-03-07 ENCOUNTER — Encounter: Payer: Self-pay | Admitting: Cardiology

## 2022-03-10 NOTE — Progress Notes (Unsigned)
Office Visit    Patient Name: Betty [name] Date of Encounter: 03/13/2022  Primary Care Provider:  Leone Haven, MD Primary Cardiologist:  Fransico Him, MD Primary Electrophysiologist: None  Chief Complaint    Betty [name] is a 76 y.o. female with PMH of NICM with previous EF 25% now 55-60% 2019, HLD, chronic hypoxic respiratory failure, chronic diastolic CHF, obesity, gout, DM, OSA, restrictive lung disease, pulmonary HTN with mild bronchiolitis who presents today for annual follow-up of nonischemic cardiomyopathy.  Past Medical History    Past Medical History:  Diagnosis Date   Allergic rhinitis    never tested, fall and spring   Arthritis    hands,knees, feet   Cataracts, bilateral    not surgical yet   CHOLECYSTECTOMY, HX OF 10/08/2007   Qualifier: Diagnosis of  By: Council Mechanic MD, [name]    Chronic diastolic CHF (congestive heart failure) (HCC)    Chronic respiratory failure ([place])    Diabetes mellitus without complication ([place])    Diverticulosis    problems with frequent gas, burping   Glucose intolerance (impaired glucose tolerance)    Gout    History of chicken pox    Hyperlipidemia    Hypertension    NICM (nonischemic cardiomyopathy) ([place]) 2002   EF 25%; improved to normal - echo 4/08: EF 50-55%, mild MR, mild LAE, mild TR;    cath 3/03: normal cors, EF 40%   Obesity    Oxygen deficiency 2017   at night   PONV (postoperative nausea and vomiting)    after wisdom tooth extraction   Restrictive lung disease    Skin cancer    skin cancers only   Sleep apnea    cpap settings 4   Past Surgical History:  Procedure Laterality Date   BILATERAL VATS ABLATION Right    biopsy done 2015- [facility]   BREAST EXCISIONAL BIOPSY Left 80's   NEG   CARDIAC CATHETERIZATION     CATARACT EXTRACTION W/PHACO Right 06/21/2020   Procedure: CATARACT EXTRACTION PHACO AND INTRAOCULAR LENS PLACEMENT ([place]) RIGHT DIABETIC;  Surgeon: Birder Robson, MD;  Location:  [facility];  Service: Ophthalmology;  Laterality: Right;  4.06 0:32.8   CATARACT EXTRACTION W/PHACO Left 07/12/2020   Procedure: CATARACT EXTRACTION PHACO AND INTRAOCULAR LENS PLACEMENT ([facility]) LEFT DIABETIC 6.96 00:46.3;  Surgeon: Birder Robson, MD;  Location: [employer] [facility];  Service: Ophthalmology;  Laterality: Left;  Diabetic - oral meds   CHOLECYSTECTOMY     choleystectomy  02/2002   COLONOSCOPY WITH PROPOFOL N/A 12/20/2014   Procedure: COLONOSCOPY WITH PROPOFOL;  Surgeon: [name], MD;  Location: [facility];  Service: Gastroenterology;  Laterality: N/A;   COLONOSCOPY WITH PROPOFOL N/A 03/22/2020   Procedure: COLONOSCOPY WITH PROPOFOL;  Surgeon: [name], MD;  Location: [facility];  Service: Gastroenterology;  Laterality: N/A;   CYSTECTOMY  1996   l breast   DILATION AND CURETTAGE OF UTERUS  2011   Dr. Hulan Fray at [place]     x 2   POLYPECTOMY  03/22/2020   Procedure: POLYPECTOMY;  Surgeon: [name], MD;  Location: [facility];  Service: Gastroenterology;;   TUBAL LIGATION  1975   VAGINAL DELIVERY     x2    Allergies  Allergies  Allergen Reactions   Amoxicillin Rash    REACTION: RASH   Etodolac Other (See Comments)    Bloody stool    Hydrochlorothiazide Other (See  Comments)    Increases gout flare   Meloxicam Other (See Comments)    constipation   Sulfa Antibiotics Other (See Comments)    rash   Allopurinol    Metformin And Related     diarrhea   Rosuvastatin Other (See Comments)    "extreme joint pain"   Sulfonamide Derivatives     REACTION: RASH   Jardiance [Empagliflozin] Other (See Comments)    Yeast infection   Lasix [Furosemide] Other (See Comments)    Pt states "it dried everything out of me"    History of Present Illness    Betty [name] is a 76 year old female with the above past medical history who presents today for annual follow-up for NICM.  She was initially seen by Dr. Harrington Challenger for  management of nonischemic cardiomyopathy and LV dysfunction.  LHC completed 2003 with normal coronaries and patient had desatted on ETT and was referred to pulmonary.  Right heart cath was performed on 12/2012 with normal heart pressures.  She was referred to [facility] and repeat [place] completed with exercise that demonstrated significant HTN and desat.  Lung biopsy was performed with no clear lung pathology.  CT completed that was negative for PE but did show upper lobe groundglass abnormalities PFTs completed that showed depressed DLCO at 35% and  2 echo was completed that showed no evidence of PHT and patient completed home sleep study and initiated CPAP therapy.  She was last seen by Dr. Radford Pax on 11/2020 for annual follow-up of NICM.  She was found to be doing well and was stable on home O2 at 3L.  She reported weaning herself off O2 during the day and chronic LE edema was also stable but present on examination.  She reported tolerating all medications at that time.  Patient had recent pulmonology follow-up and was found to have excellent compliance with her CPAP.  Since last being seen in the office patient reports she has been doing well with no cardiac complaints or breathing concerns at this time.  She states that her husband is currently going through treatment for prostate cancer and this has been very stressful for her.  She is currently not participating in an exercise routine but did recently complete her cardiac rehab program at [facility].  She is compliant with her fluid intake and abstains from excess salt in her diet.  She is currently in the process of adjusting a new CPAP machine.  Patient denies chest pain, palpitations, dyspnea, PND, orthopnea, nausea, vomiting, dizziness, syncope, edema, weight gain, or early satiety.  Home Medications    Current Outpatient Medications  Medication Sig Dispense Refill   acetic acid 2 % otic solution Insert saturated wick of cotton; keep moist  24 hours by adding 3 to 5 drops every 4 to 6 hours; remove wick after 24 hours and instill 5 drops 3 to 4 times daily 15 mL 0   albuterol (VENTOLIN HFA) 108 (90 Base) MCG/ACT inhaler Inhale 2 puffs into the lungs every 4 (four) hours as needed for wheezing or shortness of breath. 8 g 6   aspirin 81 MG EC tablet Take 1 tablet (81 mg total) by mouth daily. 90 tablet 2   CALCIUM-MAGNESIUM-VITAMIN D PO Take 1 tablet by mouth daily.     clobetasol (TEMOVATE) 0.05 % GEL Apply topically once a week.     Coenzyme Q10 (CO Q 10 PO) Take 200 mg by mouth at bedtime.      Colchicine (MITIGARE) 0.6 MG  CAPS Day 1: take 1.2 mg (2 tablets) by mouth at the first sign of flare, followed by 0.6 mg (1 tablet) by mouth after 1 hour. Day 2 and thereafter: take 0.6 mg (1 tablet) by mouth daily until flare has resolved. 30 capsule 0   ezetimibe (ZETIA) 10 MG tablet Take 1 tablet (10 mg total) by mouth daily. 90 tablet 1   Fish Oil OIL Take 1,200 mg by mouth 2 (two) times daily. GUMMIES     fluticasone (FLONASE) 50 MCG/ACT nasal spray SPRAY 2 SPRAYS INTO EACH NOSTRIL EVERY DAY 48 mL 2   losartan (COZAAR) 50 MG tablet TAKE 1/2 TABLET BY MOUTH EVERY DAY 45 tablet 3   metFORMIN (GLUCOPHAGE-XR) 500 MG 24 hr tablet TAKE 2 TABLETS (1,000 MG TOTAL) BY MOUTH 2 (TWO) TIMES DAILY WITH A MEAL. 360 tablet 1   multivitamin (THERAGRAN) per tablet Take 1 tablet by mouth daily.     nystatin cream (MYCOSTATIN) Apply 1 application topically 2 (two) times daily. 30 g 0   polyvinyl alcohol (LIQUIFILM TEARS) 1.4 % ophthalmic solution Place 1 drop into both eyes every morning.     pravastatin (PRAVACHOL) 80 MG tablet Take 1 tablet (80 mg total) by mouth daily. 90 tablet 3   carvedilol (COREG) 12.5 MG tablet TAKE 1 TABLET (12.'5MG'$  TOTAL) BY MOUTH TWICE A DAY WITH MEALS 30 tablet 0   No current facility-administered medications for this visit.     Review of Systems  Please see the history of present illness.    (+) Chronic shortness of  breath (+) Chronic edema in left extremity  All other systems reviewed and are otherwise negative except as noted above.  Physical Exam    Wt Readings from Last 3 Encounters:  03/13/22 200 lb (90.7 kg)  02/05/22 201 lb (91.2 kg)  11/14/21 201 lb (91.2 kg)   VS: Vitals:   03/13/22 1333  BP: 120/70  Pulse: 86  ,Body mass index is 33.28 kg/m.  Constitutional:      Appearance: Healthy appearance. Not in distress.  Neck:     Vascular: JVD normal.  Pulmonary:     Effort: Pulmonary effort is normal.     Breath sounds: No wheezing. No rales. Diminished in the bases Cardiovascular:     Normal rate left anterior fascicular block with regular rhythm. Normal S1. Normal S2.      Murmurs: There is no murmur.  Edema:    Peripheral edema present in the left lower extremity that is chronic Abdominal:     Palpations: Abdomen is soft non tender. There is no hepatomegaly.  Skin:    General: Skin is warm and dry.  Neurological:     General: No focal deficit present.     Mental Status: Alert and oriented to person, place and time.     Cranial Nerves: Cranial nerves are intact.  EKG/LABS/Other Studies Reviewed    ECG personally reviewed by me today -sinus rhythm with sinus arrhythmia and occasional PVC, left anterior fascicular block with no acute changes compared to previous EKG   Lab Results  Component Value Date   WBC 6.7 11/14/2021   HGB 13.9 11/14/2021   HCT 41.0 11/14/2021   MCV 98.5 11/14/2021   PLT 237.0 11/14/2021   Lab Results  Component Value Date   CREATININE 0.61 11/14/2021   BUN 15 11/14/2021   NA 141 11/14/2021   K 4.0 11/14/2021   CL 98 11/14/2021   CO2 32 11/14/2021   Lab Results  Component Value Date   ALT 15 02/19/2022   AST 16 02/19/2022   ALKPHOS 74 02/19/2022   BILITOT 0.4 02/19/2022   Lab Results  Component Value Date   CHOL 190 11/14/2021   HDL 70.40 11/14/2021   LDLCALC 58 04/30/2017   LDLDIRECT 73.0 02/19/2022   TRIG 366.0 (H) 11/14/2021    CHOLHDL 3 11/14/2021    Lab Results  Component Value Date   HGBA1C 6.9 (H) 11/14/2021    Assessment & Plan    1.  Chronic diastolic CHF: -Last 2D echo was completed 12/2017 with normal EF of 55-60% and structurally normal valves. -Current GDMT consist of carvedilol 12.5 mg twice daily, losartan 25 mg daily -Patient is euvolemic on examination today -She is compliant with her fluid and salt intake Low sodium diet, fluid restriction <2L, and daily weights encouraged. Educated to contact our office for weight gain of 2 lbs overnight or 5 lbs in one week.   2.  Nonobstructive CAD: -LHC/RHC 2003 with normal coronaries, EF=25%; 07/10/12 Echo:EF=55% Cardiac Risk Factors: Family History - CAD, Hypertension, Lipids and Obesity  Symptoms: DOE and Fatigue -Current GDMT consist of carvedilol 12.5 mg twice daily, losartan 25 mg daily  3.  HTN: -Blood pressure today was well controlled at 120/70 -Continue carvedilol 12.5 mg twice daily and losartan 25 mg daily -Continue low-sodium heart healthy diet  4.  DM type II: -Last HG A1c was 6.9 -Continue current antidiabetic regimen per PCP  5.  Hyperlipidemia: -Most recent lipid panel with LDL of 73 which is slightly above goal of 70 -PCP recently added Zetia 10 mg with plan to recheck in 6 weeks -Continue pravastatin 80 mg daily  6.  Chronic shortness of breath/restrictive lung disease: -SOB chronic secondary to pulmonary fibrosis and diastolic CHF -Patient is currently followed by pulmonology for ILD and CPAP -Continue supplemental oxygen of 3 L with activity and at nighttime  Disposition: Follow-up with Fransico Him, MD or APP in 12 months  Medication Adjustments/Labs and Tests Ordered: Current medicines are reviewed at length with the patient today.  Concerns regarding medicines are outlined above.   Signed, Mable Fill, Marissa Nestle, NP 03/13/2022, 2:03 PM [place]

## 2022-03-12 ENCOUNTER — Ambulatory Visit
Admission: RE | Admit: 2022-03-12 | Discharge: 2022-03-12 | Disposition: A | Discharge status: Home / Self Care | Payer: Medicare PPO | Source: Ambulatory Visit | Attending: Family Medicine | Admitting: Family Medicine

## 2022-03-12 DIAGNOSIS — Z1231 Encounter for screening mammogram for malignant neoplasm of breast: Secondary | ICD-10-CM | POA: Insufficient documentation

## 2022-03-13 ENCOUNTER — Encounter: Payer: Self-pay | Admitting: Nurse Practitioner

## 2022-03-13 ENCOUNTER — Telehealth: Payer: Self-pay | Admitting: Emergency Medicine

## 2022-03-13 ENCOUNTER — Ambulatory Visit: Payer: Medicare PPO | Admitting: Nurse Practitioner

## 2022-03-13 VITALS — BP 120/70 | HR 86 | Ht 65.0 in | Wt 200.0 lb

## 2022-03-13 DIAGNOSIS — E782 Mixed hyperlipidemia: Secondary | ICD-10-CM

## 2022-03-13 DIAGNOSIS — J984 Other disorders of lung: Secondary | ICD-10-CM | POA: Diagnosis not present

## 2022-03-13 DIAGNOSIS — E1121 Type 2 diabetes mellitus with diabetic nephropathy: Secondary | ICD-10-CM

## 2022-03-13 DIAGNOSIS — R0602 Shortness of breath: Secondary | ICD-10-CM | POA: Diagnosis not present

## 2022-03-13 DIAGNOSIS — I10 Essential (primary) hypertension: Secondary | ICD-10-CM

## 2022-03-13 DIAGNOSIS — I5032 Chronic diastolic (congestive) heart failure: Secondary | ICD-10-CM

## 2022-03-13 DIAGNOSIS — G4733 Obstructive sleep apnea (adult) (pediatric): Secondary | ICD-10-CM

## 2022-03-13 MED ORDER — CARVEDILOL 12.5 MG PO TABS: ORAL_TABLET | ORAL | 0 refills | Status: DC

## 2022-03-13 NOTE — Telephone Encounter (Signed)
Patient got a new CPAP last week and the air is coming out too fast. [facility] is needing a written order to turn the pressure down. Patient had to use her old machine in order to get sleep.  Call back number is [phone].

## 2022-03-13 NOTE — Patient Instructions (Signed)
Medication Instructions:  Your physician has requested that you have cardiac CT. Cardiac computed tomography (CT) is a painless test that uses an x-ray machine to take clear, detailed pictures of your heart. For further information please visit [email or web address]. Please follow instruction sheet as given.   *If you need a refill on your cardiac medications before your next appointment, please call your pharmacy*    Follow-Up: At [facility], you and your health needs are our priority.  As part of our continuing mission to provide you with exceptional heart care, we have created designated Provider Care Teams.  These Care Teams include your primary Cardiologist (physician) and Advanced Practice Providers (APPs -  Physician Assistants and Nurse Practitioners) who all work together to provide you with the care you need, when you need it.  We recommend signing up for the patient portal called "MyChart".  Sign up information is provided on this After Visit Summary.  MyChart is used to connect with patients for Virtual Visits (Telemedicine).  Patients are able to view lab/test results, encounter notes, upcoming appointments, etc.  Non-urgent messages can be sent to your provider as well.   To learn more about what you can do with MyChart, go to [email or web address].    Your next appointment:   12 year(s)  The format for your next appointment:   In Person  Provider:   Fransico Him, MD {

## 2022-03-13 NOTE — Telephone Encounter (Signed)
ATC patient. LVMTCB. 

## 2022-03-14 NOTE — Telephone Encounter (Signed)
Called patient and she states that she got her cpap last week and she feels like the pressure is too high, she is wanting to know if we can send in an order to turn down the pressure.   Please advise sir

## 2022-03-14 NOTE — Telephone Encounter (Signed)
Set CPAP 5-15 cmH2O was ordered.  Does she know what her prior CPAP settings were?  Her most recent PSG was 2014.  I could try to guess an appropriate pressure and then we could check her download at follow-up.  If she does not know what her prior CPAP pressures were then put her on 8 cmH2O

## 2022-03-14 NOTE — Telephone Encounter (Signed)
Called and left voicemail for patient to call office back in regards to cpap settings.

## 2022-03-15 ENCOUNTER — Other Ambulatory Visit (HOSPITAL_COMMUNITY): Payer: Self-pay | Admitting: Cardiology

## 2022-03-19 NOTE — Telephone Encounter (Signed)
Called patient and went over the orders from Dr Lamonte Sakai. Patient voiced understanding. Nothing further needed

## 2022-03-19 NOTE — Telephone Encounter (Signed)
Patient is returning phone call. Patient phone number is [phone].

## 2022-04-03 DIAGNOSIS — J9610 Chronic respiratory failure, unspecified whether with hypoxia or hypercapnia: Secondary | ICD-10-CM | POA: Diagnosis not present

## 2022-04-09 ENCOUNTER — Other Ambulatory Visit (INDEPENDENT_AMBULATORY_CARE_PROVIDER_SITE_OTHER): Payer: Medicare PPO

## 2022-04-09 DIAGNOSIS — E785 Hyperlipidemia, unspecified: Secondary | ICD-10-CM

## 2022-04-09 LAB — LDL CHOLESTEROL, DIRECT: Direct LDL: 51 mg/dL

## 2022-04-17 ENCOUNTER — Ambulatory Visit (INDEPENDENT_AMBULATORY_CARE_PROVIDER_SITE_OTHER): Payer: Medicare PPO

## 2022-04-17 VITALS — Ht 65.0 in | Wt 200.0 lb

## 2022-04-17 DIAGNOSIS — Z0000 Encounter for general adult medical examination without abnormal findings: Secondary | ICD-10-CM | POA: Diagnosis not present

## 2022-04-17 NOTE — Progress Notes (Addendum)
Subjective:   Betty [name] is a 76 y.o. female who presents for Medicare Annual (Subsequent) preventive examination.  Review of Systems    No ROS.  Medicare Wellness Virtual Visit.  Visual/audio telehealth visit, UTA vital signs.   See social history for additional risk factors.   Cardiac Risk Factors include: advanced age (>67mn, >>48women);diabetes mellitus     Objective:    Today's Vitals   04/17/22 1404  Weight: 200 lb (90.7 kg)  Height: '5\' 5"'$  (1.651 m)   Body mass index is 33.28 kg/m.     04/17/2022    2:02 PM 05/04/2021    2:46 PM 04/13/2021   10:19 AM 07/12/2020    7:34 AM 06/21/2020    8:40 AM 04/12/2020    9:43 AM 03/22/2020    9:40 AM  Advanced Directives  Does Patient Have a Medical Advance Directive? No No No  No No No  Would patient like information on creating a medical advance directive? No - Patient declined No - Patient declined No - Patient declined No - Patient declined No - Patient declined No - Patient declined No - Patient declined    Current Medications (verified) Outpatient Encounter Medications as of 04/17/2022  Medication Sig   acetic acid 2 % otic solution Insert saturated wick of cotton; keep moist 24 hours by adding 3 to 5 drops every 4 to 6 hours; remove wick after 24 hours and instill 5 drops 3 to 4 times daily   albuterol (VENTOLIN HFA) 108 (90 Base) MCG/ACT inhaler Inhale 2 puffs into the lungs every 4 (four) hours as needed for wheezing or shortness of breath.   aspirin 81 MG EC tablet Take 1 tablet (81 mg total) by mouth daily.   CALCIUM-MAGNESIUM-VITAMIN D PO Take 1 tablet by mouth daily.   carvedilol (COREG) 12.5 MG tablet TAKE 1 TABLET (12.'5MG'$  TOTAL) BY MOUTH TWICE A DAY WITH MEALS   clobetasol (TEMOVATE) 0.05 % GEL Apply topically once a week.   Coenzyme Q10 (CO Q 10 PO) Take 200 mg by mouth at bedtime.    Colchicine (MITIGARE) 0.6 MG CAPS Day 1: take 1.2 mg (2 tablets) by mouth at the first sign of flare, followed by 0.6 mg (1  tablet) by mouth after 1 hour. Day 2 and thereafter: take 0.6 mg (1 tablet) by mouth daily until flare has resolved.   ezetimibe (ZETIA) 10 MG tablet Take 1 tablet (10 mg total) by mouth daily.   Fish Oil OIL Take 1,200 mg by mouth 2 (two) times daily. GUMMIES   fluticasone (FLONASE) 50 MCG/ACT nasal spray SPRAY 2 SPRAYS INTO EACH NOSTRIL EVERY DAY   losartan (COZAAR) 50 MG tablet TAKE 1/2 TABLET BY MOUTH EVERY DAY   metFORMIN (GLUCOPHAGE-XR) 500 MG 24 hr tablet TAKE 2 TABLETS (1,000 MG TOTAL) BY MOUTH 2 (TWO) TIMES DAILY WITH A MEAL.   multivitamin (THERAGRAN) per tablet Take 1 tablet by mouth daily.   nystatin cream (MYCOSTATIN) Apply 1 application topically 2 (two) times daily.   polyvinyl alcohol (LIQUIFILM TEARS) 1.4 % ophthalmic solution Place 1 drop into both eyes every morning.   pravastatin (PRAVACHOL) 80 MG tablet Take 1 tablet (80 mg total) by mouth daily.   No facility-administered encounter medications on file as of 04/17/2022.    Allergies (verified) Amoxicillin, Etodolac, Hydrochlorothiazide, Meloxicam, Sulfa antibiotics, Allopurinol, Metformin and related, Rosuvastatin, Sulfonamide derivatives, Jardiance [empagliflozin], and Lasix [furosemide]   History: Past Medical History:  Diagnosis Date   Allergic rhinitis  never tested, fall and spring   Arthritis    hands,knees, feet   Cataracts, bilateral    not surgical yet   CHOLECYSTECTOMY, HX OF 10/08/2007   Qualifier: Diagnosis of  By: Council Mechanic MD, [name]    Chronic diastolic CHF (congestive heart failure) (HCC)    Chronic respiratory failure ([facility])    Diabetes mellitus without complication ([place])    Diverticulosis    problems with frequent gas, burping   Glucose intolerance (impaired glucose tolerance)    Gout    History of chicken pox    Hyperlipidemia    Hypertension    NICM (nonischemic cardiomyopathy) ([place]) 2002   EF 25%; improved to normal - echo 4/08: EF 50-55%, mild MR, mild LAE, mild TR;    cath 3/03:  normal cors, EF 40%   Obesity    Oxygen deficiency 2017   at night   PONV (postoperative nausea and vomiting)    after wisdom tooth extraction   Restrictive lung disease    Skin cancer    skin cancers only   Sleep apnea    cpap settings 4   Past Surgical History:  Procedure Laterality Date   BILATERAL VATS ABLATION Right    biopsy done 2015- [facility]   BREAST EXCISIONAL BIOPSY Left 80's   NEG   CARDIAC CATHETERIZATION     CATARACT EXTRACTION W/PHACO Right 06/21/2020   Procedure: CATARACT EXTRACTION PHACO AND INTRAOCULAR LENS PLACEMENT ([place]) RIGHT DIABETIC;  Surgeon: Birder Robson, MD;  Location: [facility];  Service: Ophthalmology;  Laterality: Right;  4.06 0:32.8   CATARACT EXTRACTION W/PHACO Left 07/12/2020   Procedure: CATARACT EXTRACTION PHACO AND INTRAOCULAR LENS PLACEMENT ([facility]) LEFT DIABETIC 6.96 00:46.3;  Surgeon: Birder Robson, MD;  Location: [facility];  Service: Ophthalmology;  Laterality: Left;  Diabetic - oral meds   CHOLECYSTECTOMY     choleystectomy  02/2002   COLONOSCOPY WITH PROPOFOL N/A 12/20/2014   Procedure: COLONOSCOPY WITH PROPOFOL;  Surgeon: [name], MD;  Location: [facility];  Service: Gastroenterology;  Laterality: N/A;   COLONOSCOPY WITH PROPOFOL N/A 03/22/2020   Procedure: COLONOSCOPY WITH PROPOFOL;  Surgeon: [name], MD;  Location: [facility];  Service: Gastroenterology;  Laterality: N/A;   CYSTECTOMY  1996   l breast   DILATION AND CURETTAGE OF UTERUS  2011   Dr. Hulan Fray at [facility]     x 2   POLYPECTOMY  03/22/2020   Procedure: POLYPECTOMY;  Surgeon: [name], MD;  Location: [facility];  Service: Gastroenterology;;   [place]     x2   Family History  Problem Relation Age of Onset   Lymphoma Mother 73   COPD Father        was a smoker   Stroke Father    Pneumonia Father    Diabetes Father    Hyperlipidemia Father    Heart disease  Father    Heart attack Father    Hypertension Father    Diabetes Sister    Depression Sister    Liver disease Sister    Meniere's disease Brother    Hypertension Brother    Schizophrenia Daughter    Bladder Cancer Maternal Grandmother    Leukemia Maternal Grandfather    Diabetes Paternal Grandfather    Heart disease Paternal Grandfather    Colon cancer Neg Hx    Stomach cancer Neg Hx    Breast cancer  Neg Hx    Colon polyps Neg Hx    Esophageal cancer Neg Hx    Rectal cancer Neg Hx    Social History   Socioeconomic History   Marital status: Married    Spouse name: Not on file   Number of children: 2   Years of education: Not on file   Highest education level: Not on file  Occupational History   Occupation: Retired Product manager: retired   Occupation: Works at [employer] Use   Smoking status: Never   Smokeless tobacco: Never  Vaping Use   Vaping Use: Never used  Substance and Sexual Activity   Alcohol use: No    Alcohol/week: 0.0 standard drinks of alcohol   Drug use: No   Sexual activity: Not Currently  Other Topics Concern   Not on file  Social History Narrative   Lives in [place] with husband. 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 No pets.   Social Determinants of Health   Financial Resource Strain: Low Risk  (04/17/2022)   Overall Financial Resource Strain (CARDIA)    Difficulty of Paying Living Expenses: Not hard at all  Food Insecurity: No Food Insecurity (04/17/2022)   Hunger Vital Sign    Worried About Running Out of Food in the Last Year: Never true    Ran Out of Food in the Last Year: Never true  Transportation Needs: No Transportation Needs (04/17/2022)   PRAPARE - Hydrologist (Medical): No    Lack of Transportation (Non-Medical): No  Physical Activity: Inactive (04/08/2018)   Exercise Vital Sign    Days of Exercise per Week: 0 days    Minutes of Exercise per Session: 0 min  Stress: No  Stress Concern Present (04/17/2022)   Uncertain    Feeling of Stress : Not at all  Social Connections: [place] (04/17/2022)   Social Connection and Isolation Panel [NHANES]    Frequency of Communication with Friends and Family: More than three times a week    Frequency of Social Gatherings with Friends and Family: More than three times a week    Attends Religious Services: More than 4 times per year    Active Member of [employer] or Organizations: Yes    Attends Music therapist: More than 4 times per year    Marital Status: Married    Tobacco Counseling Counseling given: Not Answered   Clinical Intake:  Pre-visit preparation completed: Yes        Diabetes: Yes (Followed by PCP)  How often do you need to have someone help you when you read instructions, pamphlets, or other written materials from your doctor or pharmacy?: 1 - Never   Interpreter Needed?: No    Activities of Daily Living    04/17/2022    2:10 PM  In your present state of health, do you have any difficulty performing the following activities:  Hearing? 0  Vision? 0  Difficulty concentrating or making decisions? 0  Walking or climbing stairs? 1  Comment Paces self with activity  Dressing or bathing? 0  Doing errands, shopping? 0  Preparing Food and eating ? N  Using the Toilet? N  In the past six months, have you accidently leaked urine? N  Do you have problems with loss of bowel control? N  Managing your Medications? N  Managing your Finances? N  Housekeeping or managing your Housekeeping?  N    Patient Care Team: Leone Haven, MD as PCP - General (Family Medicine) [name], MD as PCP - Sleep Medicine (Sleep Medicine) [name], MD as PCP - Cardiology (Cardiology) Dasher, Rayvon Char, MD (Dermatology) Juanito Doom, MD as Referring Physician (Internal Medicine)  Indicate any recent Medical  Services you may have received from other than [facility] providers in the past year (date may be approximate).     Assessment:   This is a routine wellness examination for [place].  Virtual Visit via Telephone Note  I connected with  Brendia Sacks on 04/17/22 at  2:00 PM EDT by telephone and verified that I am speaking with the correct person using two identifiers.  Location: Patient: home Provider: office Persons participating in the virtual visit: patient/Nurse Health Advisor   I discussed the limitations of performing an evaluation and management service by telehealth. We continued and completed visit with audio only. Some vital signs may be absent or patient reported.   Hearing/Vision screen Hearing Screening - Comments:: Patient is able to hear conversational tones without difficulty.  No issues reported. Vision Screening - Comments:: Follow[facility] Wears corrective lenses  They have seen their ophthalmologist in the last 12 months.   Dietary issues and exercise activities discussed: Current Exercise Habits: The patient does not participate in regular exercise at present   Goals Addressed             This Visit's Progress    Healthy Lifestyle       Stay active; chair exercises. Healthy diet.           Depression Screen    04/17/2022    2:09 PM 11/14/2021   10:34 AM 10/02/2021   10:05 AM 09/01/2021   10:37 AM 07/17/2021   10:31 AM 07/10/2021    9:59 AM 06/26/2021   10:48 AM  PHQ 2/9 Scores  PHQ - 2 Score 0 0 2 1 0 1 2  PHQ- 9 Score   '7 7 4 9 8    '$ Fall Risk    04/17/2022    2:09 PM 11/14/2021   10:34 AM 05/04/2021    2:45 PM 04/13/2021   10:34 AM 11/14/2020   10:06 AM  Fall Risk   Falls in the past year? 0 0 0 0 0  Number falls in past yr: 0 1  0 0  Injury with Fall?  0  0   Risk for fall due to :  No Fall Risks     Follow up Falls evaluation completed Falls evaluation completed Falls evaluation completed;Education provided;Falls prevention  discussed Falls evaluation completed Falls evaluation completed    FALL RISK PREVENTION PERTAINING TO THE HOME: Home free of loose throw rugs in walkways, pet beds, electrical cords, etc? Yes  Adequate lighting in your home to reduce risk of falls? Yes   ASSISTIVE DEVICES UTILIZED TO PREVENT FALLS: Life alert? No  Use of a cane, [place] or w/c? No  Grab bars in the bathroom? Yes  Shower chair or bench in shower? No  Comfort chair height toilet? Yes   TIMED UP AND GO: Was the test performed? No .   Cognitive Function: Patient is alert and oriented x3.      04/08/2018   11:27 AM 03/28/2017    3:23 PM 03/28/2016    3:33 PM  MMSE - Mini Mental State Exam  Orientation to time '5 5 5  '$ Orientation to Place 5 5  5  Registration '3 3 3  '$ Attention/ Calculation '5 5 5  '$ Recall '3 3 3  '$ Language- name 2 objects '2 2 2  '$ Language- repeat '1 1 1  '$ Language- follow 3 step command '3 3 3  '$ Language- read & follow direction '1 1 1  '$ Write a sentence '1 1 1  '$ Copy design '1 1 1  '$ Total score '30 30 30        '$ 04/17/2022    2:36 PM 04/12/2020    9:47 AM 04/10/2019    9:51 AM  6CIT Screen  What Year? 0 points 0 points 0 points  What month? 0 points 0 points 0 points  What time? 0 points  0 points  Count back from 20   0 points  Months in reverse 0 points 0 points 0 points  Repeat phrase  0 points 0 points  Total Score   0 points    Immunizations Immunization History  Administered Date(s) Administered   Fluad Quad(high Dose 65+) 04/30/2019, 05/17/2021   H1N1 09/08/2008   Influenza Split 06/28/2011, 05/01/2012   Influenza Whole 07/11/2007, 06/07/2008, 07/12/2009   Influenza, High Dose Seasonal PF 05/12/2018, 05/19/2020   Influenza,inj,Quad PF,6+ Mos 04/23/2014, 04/21/2015   Influenza-Unspecified 04/27/2013, 05/04/2016, 04/24/2017   PFIZER(Purple Top)SARS-COV-2 Vaccination 09/10/2019, 10/01/2019, 05/30/2020, 12/23/2020   Pneumococcal Conjugate-13 01/21/2014   Pneumococcal Polysaccharide-23 08/28/2011,  03/06/2012   Td 10/03/2007, 10/14/2013   Tdap 10/11/2013   Zoster Recombinat (Shingrix) 04/13/2019, 05/18/2019, 07/16/2019   Zoster, Live 06/21/2010   Screening Tests Health Maintenance  Topic Date Due   INFLUENZA VACCINE  03/27/2022   FOOT EXAM  04/13/2022   COVID-19 Vaccine (5 - Pfizer risk series) 05/03/2022 (Originally 02/17/2021)   HEMOGLOBIN A1C  05/17/2022   OPHTHALMOLOGY EXAM  12/29/2022   TETANUS/TDAP  10/15/2023   COLONOSCOPY (Pts 45-87yr Insurance coverage will need to be confirmed)  03/22/2025   Pneumonia Vaccine 75 Years old  Completed   DEXA SCAN  Completed   Hepatitis C Screening  Completed   Zoster Vaccines- Shingrix  Completed   HPV VACCINES  Aged Out   Health Maintenance Health Maintenance Due  Topic Date Due   INFLUENZA VACCINE  03/27/2022   FOOT EXAM  04/13/2022   Lung Cancer Screening: (Low Dose CT Chest recommended if Age 75-80years, 30 pack-year currently smoking OR have quit w/in 15years.) does not qualify.   Vision Screening: Recommended annual ophthalmology exams for early detection of glaucoma and other disorders of the eye.  Dental Screening: Recommended annual dental exams for proper oral hygiene  Community Resource Referral / Chronic Care Management: CRR required this visit?  No   CCM required this visit?  No      Plan:     I have personally reviewed and noted the following in the patient's chart:   Medical and social history Use of alcohol, tobacco or illicit drugs  Current medications and supplements including opioid prescriptions. Patient is not currently taking opioid prescriptions. Functional ability and status Nutritional status Physical activity Advanced directives List of other physicians Hospitalizations, surgeries, and ER visits in previous 12 months Vitals Screenings to include cognitive, depression, and falls Referrals and appointments  In addition, I have reviewed and discussed with patient certain preventive  protocols, quality metrics, and best practice recommendations. A written personalized care plan for preventive services as well as general preventive health recommendations were provided to patient.     OVarney Biles LPN   [id number]

## 2022-04-17 NOTE — Patient Instructions (Addendum)
  Betty [name] , Thank you for taking time to come for your Medicare Wellness Visit. I appreciate your ongoing commitment to your health goals. Please review the following plan we discussed and let me know if I can assist you in the future.   These are the goals we discussed:  Goals      Healthy Lifestyle     Stay active; chair exercises. Healthy diet.            This is a list of the screening recommended for you and due dates:  Health Maintenance  Topic Date Due   Flu Shot  03/27/2022   Complete foot exam   04/13/2022   COVID-19 Vaccine (5 - Pfizer risk series) 05/03/2022*   Hemoglobin A1C  05/17/2022   Eye exam for diabetics  12/29/2022   Tetanus Vaccine  10/15/2023   Colon Cancer Screening  03/22/2025   Pneumonia Vaccine  Completed   DEXA scan (bone density measurement)  Completed   Hepatitis C Screening: USPSTF Recommendation to screen - Ages 26-79 yo.  Completed   Zoster (Shingles) Vaccine  Completed   HPV Vaccine  Aged Out  *Topic was postponed. The date shown is not the original due date.

## 2022-05-04 DIAGNOSIS — J9610 Chronic respiratory failure, unspecified whether with hypoxia or hypercapnia: Secondary | ICD-10-CM | POA: Diagnosis not present

## 2022-05-16 DIAGNOSIS — D2261 Melanocytic nevi of right upper limb, including shoulder: Secondary | ICD-10-CM | POA: Diagnosis not present

## 2022-05-16 DIAGNOSIS — X32XXXA Exposure to sunlight, initial encounter: Secondary | ICD-10-CM | POA: Diagnosis not present

## 2022-05-16 DIAGNOSIS — Z85828 Personal history of other malignant neoplasm of skin: Secondary | ICD-10-CM | POA: Diagnosis not present

## 2022-05-16 DIAGNOSIS — D2262 Melanocytic nevi of left upper limb, including shoulder: Secondary | ICD-10-CM | POA: Diagnosis not present

## 2022-05-16 DIAGNOSIS — D2272 Melanocytic nevi of left lower limb, including hip: Secondary | ICD-10-CM | POA: Diagnosis not present

## 2022-05-16 DIAGNOSIS — L570 Actinic keratosis: Secondary | ICD-10-CM | POA: Diagnosis not present

## 2022-05-18 ENCOUNTER — Ambulatory Visit: Payer: Medicare PPO | Admitting: Family Medicine

## 2022-05-21 ENCOUNTER — Encounter: Payer: Self-pay | Admitting: Family Medicine

## 2022-05-21 ENCOUNTER — Ambulatory Visit: Payer: Medicare PPO | Admitting: Family Medicine

## 2022-05-21 VITALS — BP 122/78 | HR 86 | Temp 98.0°F | Ht 65.0 in | Wt 201.2 lb

## 2022-05-21 DIAGNOSIS — E1121 Type 2 diabetes mellitus with diabetic nephropathy: Secondary | ICD-10-CM | POA: Diagnosis not present

## 2022-05-21 DIAGNOSIS — E782 Mixed hyperlipidemia: Secondary | ICD-10-CM | POA: Diagnosis not present

## 2022-05-21 DIAGNOSIS — J984 Other disorders of lung: Secondary | ICD-10-CM

## 2022-05-21 DIAGNOSIS — I5032 Chronic diastolic (congestive) heart failure: Secondary | ICD-10-CM | POA: Diagnosis not present

## 2022-05-21 DIAGNOSIS — Z23 Encounter for immunization: Secondary | ICD-10-CM | POA: Diagnosis not present

## 2022-05-21 LAB — COMPREHENSIVE METABOLIC PANEL
ALT: 15 U/L (ref 0–35)
AST: 16 U/L (ref 0–37)
Albumin: 4 g/dL (ref 3.5–5.2)
Alkaline Phosphatase: 73 U/L (ref 39–117)
BUN: 16 mg/dL (ref 6–23)
CO2: 34 mEq/L — ABNORMAL HIGH (ref 19–32)
Calcium: 9.5 mg/dL (ref 8.4–10.5)
Chloride: 97 mEq/L (ref 96–112)
Creatinine, Ser: 0.61 mg/dL (ref 0.40–1.20)
GFR: 87.54 mL/min (ref >60.00)
Glucose, Bld: 102 mg/dL — ABNORMAL HIGH (ref 70–99)
Potassium: 4.3 mEq/L (ref 3.5–5.1)
Sodium: 140 mEq/L (ref 135–145)
Total Bilirubin: 0.4 mg/dL (ref 0.2–1.2)
Total Protein: 7.5 g/dL (ref 6.0–8.3)

## 2022-05-21 LAB — MICROALBUMIN / CREATININE URINE RATIO
Creatinine,U: 68.8 mg/dL
Microalb Creat Ratio: 1 mg/g (ref 0.0–30.0)
Microalb, Ur: <0.7 mg/dL (ref 0.0–1.9)

## 2022-05-21 MED ORDER — PRAVASTATIN SODIUM 40 MG PO TABS: 40.0000 mg | ORAL_TABLET | Freq: Every day | ORAL | 3 refills | Status: DC

## 2022-05-21 NOTE — Assessment & Plan Note (Signed)
Stable.  She will continue to see cardiology.  She will continue carvedilol 12.5 mg twice daily and losartan 25 mg daily.  Check labs.

## 2022-05-21 NOTE — Assessment & Plan Note (Signed)
She will go back to pravastatin 40 mg daily.

## 2022-05-21 NOTE — Assessment & Plan Note (Signed)
Check A1c.  Discussed we would switch her metformin to something different though I want to see what her kidney and liver function are before making a decision on medication management.

## 2022-05-21 NOTE — Patient Instructions (Signed)
Nice to see you. Please go back to taking pravastatin 40 mg daily. We will contact you with your lab results.

## 2022-05-21 NOTE — Progress Notes (Signed)
Tommi Rumps, MD Phone: [phone]  Betty [name] is a 76 y.o. female who presents today for f/u.  Diastolic heart failure Disease Monitoring: Blood pressure range-not checking Chest pain- no      Dyspnea- chronic and stable Medications: Compliance- taking coreg, losartan    Edema- chronic and stable  DIABETES Disease Monitoring: Blood Sugar ranges-not checking Polyuria/phagia/dipsia- no      Optho- UTD Medications: Compliance- taking metformin XR 500 mg BID, has GI issues with this dosing Hypoglycemic symptoms- no  HYPERLIPIDEMIA Disease Monitoring: See symptoms for Hypertension Medications: Compliance- stopped pravastatin 80 mg due to myalgias, was able to tolerate 40 mg dose Right upper quadrant pain- no  Muscle aches- yes  Restrictive lung disease: Patient continues to follow with pulmonology.  She notes her breathing is stable.    Social History   Tobacco Use  Smoking Status Never  Smokeless Tobacco Never    Current Outpatient Medications on File Prior to Visit  Medication Sig Dispense Refill   acetic acid 2 % otic solution Insert saturated wick of cotton; keep moist 24 hours by adding 3 to 5 drops every 4 to 6 hours; remove wick after 24 hours and instill 5 drops 3 to 4 times daily 15 mL 0   albuterol (VENTOLIN HFA) 108 (90 Base) MCG/ACT inhaler Inhale 2 puffs into the lungs every 4 (four) hours as needed for wheezing or shortness of breath. 8 g 6   aspirin 81 MG EC tablet Take 1 tablet (81 mg total) by mouth daily. 90 tablet 2   CALCIUM-MAGNESIUM-VITAMIN D PO Take 1 tablet by mouth daily.     carvedilol (COREG) 12.5 MG tablet TAKE 1 TABLET (12.5MG TOTAL) BY MOUTH TWICE A DAY WITH MEALS 180 tablet 3   clobetasol (TEMOVATE) 0.05 % GEL Apply topically once a week.     Coenzyme Q10 (CO Q 10 PO) Take 200 mg by mouth at bedtime.      Colchicine (MITIGARE) 0.6 MG CAPS Day 1: take 1.2 mg (2 tablets) by mouth at the first sign of flare, followed by 0.6 mg (1 tablet)  by mouth after 1 hour. Day 2 and thereafter: take 0.6 mg (1 tablet) by mouth daily until flare has resolved. 30 capsule 0   ezetimibe (ZETIA) 10 MG tablet Take 1 tablet (10 mg total) by mouth daily. 90 tablet 1   Fish Oil OIL Take 1,200 mg by mouth 2 (two) times daily. GUMMIES     fluticasone (FLONASE) 50 MCG/ACT nasal spray SPRAY 2 SPRAYS INTO EACH NOSTRIL EVERY DAY 48 mL 2   losartan (COZAAR) 50 MG tablet TAKE 1/2 TABLET BY MOUTH EVERY DAY 45 tablet 3   metFORMIN (GLUCOPHAGE-XR) 500 MG 24 hr tablet TAKE 2 TABLETS (1,000 MG TOTAL) BY MOUTH 2 (TWO) TIMES DAILY WITH A MEAL. 360 tablet 1   multivitamin (THERAGRAN) per tablet Take 1 tablet by mouth daily.     nystatin cream (MYCOSTATIN) Apply 1 application topically 2 (two) times daily. 30 g 0   polyvinyl alcohol (LIQUIFILM TEARS) 1.4 % ophthalmic solution Place 1 drop into both eyes every morning.     No current facility-administered medications on file prior to visit.     ROS see history of present illness  Objective  Physical Exam Vitals:   05/21/22 1040  BP: 122/78  Pulse: 86  Temp: 98 F (36.7 C)  SpO2: 90%    BP Readings from Last 3 Encounters:  05/21/22 122/78  03/13/22 120/70  02/05/22 120/76  Wt Readings from Last 3 Encounters:  05/21/22 201 lb 3.2 oz (91.3 kg)  04/17/22 200 lb (90.7 kg)  03/13/22 200 lb (90.7 kg)    Physical Exam Constitutional:      General: She is not in acute distress.    Appearance: She is not diaphoretic.  Cardiovascular:     Rate and Rhythm: Normal rate and regular rhythm.     Heart sounds: Normal heart sounds.  Pulmonary:     Effort: Pulmonary effort is normal.     Breath sounds: Normal breath sounds.  Skin:    General: Skin is warm and dry.  Neurological:     Mental Status: She is alert.    Diabetic Foot Exam - Simple   Simple Foot Form Visual Inspection No deformities, no ulcerations, no other skin breakdown bilaterally: Yes Sensation Testing See comments: Yes Pulse  Check Posterior Tibialis and Dorsalis pulse intact bilaterally: Yes Comments Absent monofilament sensation bilaterally, sensation to light touch intact bilaterally      Assessment/Plan: Please see individual problem list.  Problem List Items Addressed This Visit     Chronic diastolic heart failure (HCC) - Primary (Chronic)    Stable.  She will continue to see cardiology.  She will continue carvedilol 12.5 mg twice daily and losartan 25 mg daily.  Check labs.      Relevant Medications   pravastatin (PRAVACHOL) 40 MG tablet   Other Relevant Orders   Comp Met (CMET)   Diabetes mellitus type 2, controlled (HCC) (Chronic)    Check A1c.  Discussed we would switch her metformin to something different though I want to see what her kidney and liver function are before making a decision on medication management.      Relevant Medications   pravastatin (PRAVACHOL) 40 MG tablet   Other Relevant Orders   Comp Met (CMET)   HgB A1c   Urine Microalbumin w/creat. ratio   HYPERLIPIDEMIA, MIXED (Chronic)    She will go back to pravastatin 40 mg daily.      Relevant Medications   pravastatin (PRAVACHOL) 40 MG tablet   Restrictive lung disease (Chronic)    She will continue management through pulmonology.      Other Visit Diagnoses     Need for immunization against influenza       Relevant Orders   Flu Vaccine QUAD High Dose(Fluad) (Completed)       Return in about 6 months (around 11/19/2022).   Tommi Rumps, MD [place]

## 2022-05-21 NOTE — Assessment & Plan Note (Signed)
She will continue management through pulmonology.

## 2022-05-22 LAB — HEMOGLOBIN A1C: Hgb A1c MFr Bld: 7.2 % — ABNORMAL HIGH (ref 4.6–6.5)

## 2022-05-23 ENCOUNTER — Other Ambulatory Visit: Payer: Self-pay | Admitting: Family Medicine

## 2022-05-29 ENCOUNTER — Telehealth (INDEPENDENT_AMBULATORY_CARE_PROVIDER_SITE_OTHER): Payer: Medicare PPO | Admitting: Family Medicine

## 2022-05-29 DIAGNOSIS — R059 Cough, unspecified: Secondary | ICD-10-CM

## 2022-05-29 DIAGNOSIS — Z20822 Contact with and (suspected) exposure to covid-19: Secondary | ICD-10-CM

## 2022-05-29 DIAGNOSIS — R0982 Postnasal drip: Secondary | ICD-10-CM

## 2022-05-29 MED ORDER — NIRMATRELVIR/RITONAVIR (PAXLOVID)TABLET: 3.0000 | ORAL_TABLET | Freq: Two times a day (BID) | ORAL | 0 refills | Status: AC

## 2022-05-29 NOTE — Patient Instructions (Addendum)
HOME CARE TIPS:   -I sent the medication(s) we discussed to your pharmacy: Meds ordered this encounter  Medications   nirmatrelvir/ritonavir EUA (PAXLOVID) 20 x 150 MG & 10 x '100MG'$  TABS    Sig: Take 3 tablets by mouth 2 (two) times daily for 5 days. (Take nirmatrelvir 150 mg two tablets twice daily for 5 days and ritonavir 100 mg one tablet twice daily for 5 days) Patient GFR is >60    Dispense:  30 tablet    Refill:  0     -I sent in the [facility] treatment or referral you requested per our discussion. Please see the information provided below and discuss further with the pharmacist/treatment team.  -If taking Paxlovid, please review all medications, supplement and over the counter drugs with your pharmacist and ask them to check for any interactions. Please make the following changes to your regular medications while taking Paxlovid: *HOLD you cholesterol medication, pravastatin/Pravachol, during treatment with Paxlovid and restart 3 days after finishing treatment *Do not take colchicine/Mitagare while taking paxlovid *hold vitamins and supplements while taking Paxlovid  -there is a chance of rebound illness with covid after improving. This can happen whether or not you take an antiviral treatment. If you become sick again with covid after getting better, please schedule a follow up virtual visit and isolate again.  -can use tylenol if needed for fevers, aches and pains per instructions  -nasal saline sinus rinses twice daily  -can take 1000 IU (46mg) Vit D3 and 100-500 mg of Vit C daily per instructions  -follow up with your doctor in 2-3 days unless improving and feeling better  -stay home while sick, except to seek medical care. If you have COVID19, you will likely be contagious for 7-10 days. Wear a good mask that fits snugly (such as N95 or KN95) if around others to reduce the risk of transmission.  It was nice to meet you today, and I really hope you are feeling better soon. I  help [facility] out with telemedicine visits on Tuesdays and Thursdays and am happy to help if you need a follow up virtual visit on those days. Otherwise, if you have any concerns or questions following this visit please schedule a follow up visit with your Primary Care doctor or seek care at a local urgent care clinic to avoid delays in care.    Seek in person care or schedule a follow up video visit promptly if your symptoms worsen, new concerns arise or you are not improving with treatment. Call 911 and/or seek emergency care if your symptoms are severe or life threatening.   See the following link for the most recent information regarding Paxlovid:  [email or web address]   Nirmatrelvir; Ritonavir Tablets What is this medication? NIRMATRELVIR; RITONAVIR (NIR ma TREL vir; ri TOE na veer) treats mild to moderate COVID-19. It may help people who are at high risk of developing severe illness. This medication works by limiting the spread of the virus in your body. The FDA has allowed the emergency use of this medication. This medicine may be used for other purposes; ask your health care provider or pharmacist if you have questions. COMMON BRAND NAME(S): PAXLOVID What should I tell my care team before I take this medication? They need to know if you have any of these conditions: Any allergies Any serious illness Kidney disease Liver disease An unusual or allergic reaction to nirmatrelvir, ritonavir, other medications, foods, dyes, or preservatives Pregnant or trying to get pregnant Breast-feeding How should  I use this medication? This product contains 2 different medications that are packaged together. For the standard dose, take 2 pink tablets of nirmatrelvir with 1 white tablet of ritonavir (3 tablets total) by mouth with water twice daily. Talk to your care team if you have kidney disease. You may need a different dose. Swallow the tablets whole. You can take it with or without food. If it  upsets your stomach, take it with food. Take all of this medication unless your care team tells you to stop it early. Keep taking it even if you think you are better. Talk to your care team about the use of this medication in children. While it may be prescribed for children as young as 12 years for selected conditions, precautions do apply. Overdosage: If you think you have taken too much of this medicine contact a poison control center or emergency room at once. NOTE: This medicine is only for you. Do not share this medicine with others. What if I miss a dose? If you miss a dose, take it as soon as you can unless it is more than 8 hours late. If it is more than 8 hours late, skip the missed dose. Take the next dose at the normal time. Do not take extra or 2 doses at the same time to make up for the missed dose. What may interact with this medication? Do not take this medication with any of the following medications: Alfuzosin Certain medications for anxiety or sleep like midazolam, triazolam Certain medications for cancer like apalutamide, enzalutamide Certain medications for cholesterol like lovastatin, simvastatin Certain medications for irregular heart beat like amiodarone, dronedarone, flecainide, propafenone, quinidine Certain medications for pain like meperidine, piroxicam Certain medications for psychotic disorders like clozapine, lurasidone, pimozide Certain medications for seizures like carbamazepine, phenobarbital, phenytoin Colchicine Eletriptan Eplerenone Ergot alkaloids like dihydroergotamine, ergonovine, ergotamine, methylergonovine Finerenone Flibanserin Ivabradine Lomitapide Naloxegol Ranolazine Rifampin Sildenafil Silodosin St. John's Wort Tolvaptan Ubrogepant Voclosporin This medication may also interact with the following medications: Bedaquiline Birth control pills Bosentan Certain antibiotics like erythromycin or clarithromycin Certain medications for  blood pressure like amlodipine, diltiazem, felodipine, nicardipine, nifedipine Certain medications for cancer like abemaciclib, ceritinib, dasatinib, encorafenib, ibrutinib, ivosidenib, neratinib, nilotinib, venetoclax, vinblastine, vincristine Certain medications for cholesterol like atorvastatin, rosuvastatin Certain medications for depression like bupropion, trazodone Certain medications for fungal infections like isavuconazonium, itraconazole, ketoconazole, voriconazole Certain medications for hepatitis C like elbasvir; grazoprevir, dasabuvir; ombitasvir; paritaprevir; ritonavir, glecaprevir; pibrentasvir, sofosbuvir; velpatasvir; voxilaprevir Certain medications for HIV or AIDS Certain medications for irregular heartbeat like lidocaine Certain medications that treat or prevent blood clots like rivaroxaban, warfarin Digoxin Fentanyl Medications that lower your chance of fighting infection like cyclosporine, sirolimus, tacrolimus Methadone Quetiapine Rifabutin Salmeterol Steroid medications like betamethasone, budesonide, ciclesonide, dexamethasone, fluticasone, methylprednisone, mometasone, triamcinolone This list may not describe all possible interactions. Give your health care provider a list of all the medicines, herbs, non-prescription drugs, or dietary supplements you use. Also tell them if you smoke, drink alcohol, or use illegal drugs. Some items may interact with your medicine. What should I watch for while using this medication? Your condition will be monitored carefully while you are receiving this medication. Visit your care team for regular checkups. Tell your care team if your symptoms do not start to get better or if they get worse. If you have untreated HIV infection, this medication may lead to some HIV medications not working as well in the future. Birth control may not work properly while you are taking  this medication. Talk to your care team about using an extra method of  birth control. What side effects may I notice from receiving this medication? Side effects that you should report to your care team as soon as possible: Allergic reactions--skin rash, itching, hives, swelling of the face, lips, tongue, or throat Liver injury--right upper belly pain, loss of appetite, nausea, light-colored stool, dark yellow or brown urine, yellowing skin or eyes, unusual weakness or fatigue Redness, blistering, peeling, or loosening of the skin, including inside the mouth Side effects that usually do not require medical attention (report these to your care team if they continue or are bothersome): Change in taste Diarrhea General discomfort and fatigue Increase in blood pressure Muscle pain Nausea Stomach pain This list may not describe all possible side effects. Call your doctor for medical advice about side effects. You may report side effects to FDA at 1-800-FDA-1088. Where should I keep my medication? Keep out of the reach of children and pets. Store at room temperature between 20 and 25 degrees C (68 and 77 degrees F). Get rid of any unused medication after the expiration date. To get rid of medications that are no longer needed or have expired: Take the medication to a medication take-back program. Check with your pharmacy or law enforcement to find a location. If you cannot return the medication, check the label or package insert to see if the medication should be thrown out in the garbage or flushed down the toilet. If you are not sure, ask your care team. If it is safe to put it in the trash, take the medication out of the container. Mix the medication with cat litter, dirt, coffee grounds, or other unwanted substance. Seal the mixture in a bag or container. Put it in the trash. NOTE: This sheet is a summary. It may not cover all possible information. If you have questions about this medicine, talk to your doctor, pharmacist, or health care provider.  2022  Elsevier/Gold Standard (2021-05-15 00:00:00)

## 2022-05-29 NOTE — Progress Notes (Signed)
Virtual Visit via Video Note  I connected with Betty [name]  on 05/29/22 at  4:40 PM EDT by a video enabled telemedicine application and verified that I am speaking with the correct person using two identifiers.  Location patient: [place] Location provider:work or home office Persons participating in the virtual visit: patient, provider, pt husband  I discussed the limitations and requested verbal permission for telemedicine visit. The patient expressed understanding and agreed to proceed.   HPI:  Acute telemedicine visit for ? Of Covid19: -Onset: 3 days ago -Symptoms include: cough, drainage in throat, slight headache -husband has covid currently and tested positive, she tested negative x 1 but she and husband feels she likely has covid as well -Denies:CP, SOB above baseline, NVD, inability to tol oral intake or get up out of bed -Pertinent past medical history: see below, GFR > 87 last week -Pertinent medication allergies: Allergies  Allergen Reactions   Amoxicillin Rash    REACTION: RASH   Etodolac Other (See Comments)    Bloody stool    Hydrochlorothiazide Other (See Comments)    Increases gout flare   Meloxicam Other (See Comments)    constipation   Sulfa Antibiotics Other (See Comments)    rash   Allopurinol    Metformin And Related     diarrhea   Rosuvastatin Other (See Comments)    "extreme joint pain"   Sulfonamide Derivatives     REACTION: RASH   Jardiance [Empagliflozin] Other (See Comments)    Yeast infection   Lasix [Furosemide] Other (See Comments)    Pt states "it dried everything out of me"   -COVID-19 vaccine status:  Immunization History  Administered Date(s) Administered   Fluad Quad(high Dose 65+) 04/30/2019, 05/17/2021, 05/21/2022   H1N1 09/08/2008   Influenza Split 06/28/2011, 05/01/2012   Influenza Whole 07/11/2007, 06/07/2008, 07/12/2009   Influenza, High Dose Seasonal PF 05/12/2018, 05/19/2020   Influenza,inj,Quad PF,6+ Mos 04/23/2014, 04/21/2015    Influenza-Unspecified 04/27/2013, 05/04/2016, 04/24/2017   PFIZER(Purple Top)SARS-COV-2 Vaccination 09/10/2019, 10/01/2019, 05/30/2020, 12/23/2020   Pneumococcal Conjugate-13 01/21/2014   Pneumococcal Polysaccharide-23 08/28/2011, 03/06/2012   Td 10/03/2007, 10/14/2013   Tdap 10/11/2013   Zoster Recombinat (Shingrix) 04/13/2019, 05/18/2019, 07/16/2019   Zoster, Live 06/21/2010     ROS: See pertinent positives and negatives per HPI.  Past Medical History:  Diagnosis Date   Allergic rhinitis    never tested, fall and spring   Arthritis    hands,knees, feet   Cataracts, bilateral    not surgical yet   CHOLECYSTECTOMY, HX OF 10/08/2007   Qualifier: Diagnosis of  By: Council Mechanic MD, [name]    Chronic diastolic CHF (congestive heart failure) (HCC)    Chronic respiratory failure ([facility])    Diabetes mellitus without complication ([facility])    Diverticulosis    problems with frequent gas, burping   Glucose intolerance (impaired glucose tolerance)    Gout    History of chicken pox    Hyperlipidemia    Hypertension    NICM (nonischemic cardiomyopathy) ([facility]) 2002   EF 25%; improved to normal - echo 4/08: EF 50-55%, mild MR, mild LAE, mild TR;    cath 3/03: normal cors, EF 40%   Obesity    Oxygen deficiency 2017   at night   PONV (postoperative nausea and vomiting)    after wisdom tooth extraction   Restrictive lung disease    Skin cancer    skin cancers only   Sleep apnea    cpap settings 4    Past  Surgical History:  Procedure Laterality Date   BILATERAL VATS ABLATION Right    biopsy done 2015- [facility]   BREAST EXCISIONAL BIOPSY Left 80's   NEG   CARDIAC CATHETERIZATION     CATARACT EXTRACTION W/PHACO Right 06/21/2020   Procedure: CATARACT EXTRACTION PHACO AND INTRAOCULAR LENS PLACEMENT ([place]) RIGHT DIABETIC;  Surgeon: Birder Robson, MD;  Location: [place] [facility];  Service: Ophthalmology;  Laterality: Right;  4.06 0:32.8   CATARACT EXTRACTION W/PHACO Left 07/12/2020    Procedure: CATARACT EXTRACTION PHACO AND INTRAOCULAR LENS PLACEMENT ([facility]) LEFT DIABETIC 6.96 00:46.3;  Surgeon: Birder Robson, MD;  Location: [facility];  Service: Ophthalmology;  Laterality: Left;  Diabetic - oral meds   CHOLECYSTECTOMY     choleystectomy  02/2002   COLONOSCOPY WITH PROPOFOL N/A 12/20/2014   Procedure: COLONOSCOPY WITH PROPOFOL;  Surgeon: [name], MD;  Location: [facility];  Service: Gastroenterology;  Laterality: N/A;   COLONOSCOPY WITH PROPOFOL N/A 03/22/2020   Procedure: COLONOSCOPY WITH PROPOFOL;  Surgeon: [name], MD;  Location: [facility];  Service: Gastroenterology;  Laterality: N/A;   CYSTECTOMY  1996   l breast   DILATION AND CURETTAGE OF UTERUS  2011   Dr. Hulan Fray at [facility]     x 2   POLYPECTOMY  03/22/2020   Procedure: POLYPECTOMY;  Surgeon: [name], MD;  Location: [facility];  Service: Gastroenterology;;   [place]     x2     Current Outpatient Medications:    nirmatrelvir/ritonavir EUA (PAXLOVID) 20 x 150 MG & 10 x '100MG'$  TABS, Take 3 tablets by mouth 2 (two) times daily for 5 days. (Take nirmatrelvir 150 mg two tablets twice daily for 5 days and ritonavir 100 mg one tablet twice daily for 5 days) Patient GFR is >60, Disp: 30 tablet, Rfl: 0   acetic acid 2 % otic solution, Insert saturated wick of cotton; keep moist 24 hours by adding 3 to 5 drops every 4 to 6 hours; remove wick after 24 hours and instill 5 drops 3 to 4 times daily, Disp: 15 mL, Rfl: 0   albuterol (VENTOLIN HFA) 108 (90 Base) MCG/ACT inhaler, Inhale 2 puffs into the lungs every 4 (four) hours as needed for wheezing or shortness of breath., Disp: 8 g, Rfl: 6   aspirin 81 MG EC tablet, Take 1 tablet (81 mg total) by mouth daily., Disp: 90 tablet, Rfl: 2   CALCIUM-MAGNESIUM-VITAMIN D PO, Take 1 tablet by mouth daily., Disp: , Rfl:    carvedilol (COREG) 12.5 MG tablet, TAKE 1 TABLET (12.'5MG'$  TOTAL) BY  MOUTH TWICE A DAY WITH MEALS, Disp: 180 tablet, Rfl: 3   clobetasol (TEMOVATE) 0.05 % GEL, Apply topically once a week., Disp: , Rfl:    Coenzyme Q10 (CO Q 10 PO), Take 200 mg by mouth at bedtime. , Disp: , Rfl:    Colchicine (MITIGARE) 0.6 MG CAPS, Day 1: take 1.2 mg (2 tablets) by mouth at the first sign of flare, followed by 0.6 mg (1 tablet) by mouth after 1 hour. Day 2 and thereafter: take 0.6 mg (1 tablet) by mouth daily until flare has resolved., Disp: 30 capsule, Rfl: 0   ezetimibe (ZETIA) 10 MG tablet, Take 1 tablet (10 mg total) by mouth daily., Disp: 90 tablet, Rfl: 1   Fish Oil OIL, Take 1,200 mg by mouth 2 (two) times daily. GUMMIES, Disp: , Rfl:  fluticasone (FLONASE) 50 MCG/ACT nasal spray, SPRAY 2 SPRAYS INTO EACH NOSTRIL EVERY DAY, Disp: 48 mL, Rfl: 2   losartan (COZAAR) 50 MG tablet, TAKE 1/2 TABLET BY MOUTH EVERY DAY, Disp: 45 tablet, Rfl: 3   multivitamin (THERAGRAN) per tablet, Take 1 tablet by mouth daily., Disp: , Rfl:    nystatin cream (MYCOSTATIN), Apply 1 application topically 2 (two) times daily., Disp: 30 g, Rfl: 0   polyvinyl alcohol (LIQUIFILM TEARS) 1.4 % ophthalmic solution, Place 1 drop into both eyes every morning., Disp: , Rfl:    pravastatin (PRAVACHOL) 40 MG tablet, Take 1 tablet (40 mg total) by mouth daily., Disp: 90 tablet, Rfl: 3 Colchicine prn and not currently taking EXAM:  VITALS per patient if applicable:  GENERAL: alert, oriented, appears well and in no acute distress  HEENT: atraumatic, conjunttiva clear, no obvious abnormalities on inspection of external nose and ears  NECK: normal movements of the head and neck  LUNGS: on inspection no signs of respiratory distress, breathing rate appears normal, no obvious gross SOB, gasping or wheezing  CV: no obvious cyanosis  MS: moves all visible extremities without noticeable abnormality  PSYCH/NEURO: pleasant and cooperative, no obvious depression or anxiety, speech and thought processing grossly  intact  ASSESSMENT AND PLAN:  Discussed the following assessment and plan:  Close exposure to COVID-19 virus  Cough, unspecified type  PND (post-nasal drip)  -we discussed possible serious and likely etiologies, options for evaluation and workup, limitations of telemedicine visit vs in person visit, treatment, treatment risks and precautions. Pt is agreeable to treatment via telemedicine at this moment. Query covid most likely with false negative testing vs other.Advised could do serial testing and treat if positive if within 5 days onset or do empiric tx for suspected covid. She prefers empiric tx.   Discussed treatment options, side effect and risk of drug interactions, ideal treatment window, potential complications, isolation and precautions for COVID-19.  Discussed possibility of rebound with or without antivirals. Checked for/reviewed last GFR - listed in HPI if available.  After lengthy discussion, the patient opted for treatment with Paxlovod due to being higher risk for complications of covid or severe disease and other factors. Discussed EUA status of this drug and the fact that there is preliminary limited knowledge of risks/interactions/side effects per EUA document vs possible benefits and precautions. She agrees to hold her statin per recommendations, avoid colchicine use during tx and monitor BP if any symptoms of hypotension to hold arb if low during tx.  This information was shared with patient during the visit and also was provided in patient instructions. Other symptomatic care measures summarized in patient instructions.  Of Note: this patient was checked in by the front office and clinical staff under the wrong patient chart. I did catch this and had them reschedule to the correct pt chart and delete/cancel visit in incorrect chart. Confirm current chart with DOB, name and address.   Advised to seek prompt virtual visit or in person care if worsening, new symptoms arise, or if  is not improving with treatment as expected per our conversation of expected course. Discussed options for follow up care. Did let this patient know that I do telemedicine on Tuesdays and Thursdays for  and those are the days I am logged into the system. Advised to schedule follow up visit with PCP, [facility] virtual visits or [facility] if any further questions or concerns to avoid delays in care.   I discussed the assessment and treatment plan  with the patient. The patient was provided an opportunity to ask questions and all were answered. The patient agreed with the plan and demonstrated an understanding of the instructions.     Lucretia Kern, DO

## 2022-06-01 ENCOUNTER — Encounter: Payer: Self-pay | Admitting: Family Medicine

## 2022-06-01 DIAGNOSIS — E1121 Type 2 diabetes mellitus with diabetic nephropathy: Secondary | ICD-10-CM

## 2022-06-03 DIAGNOSIS — J9610 Chronic respiratory failure, unspecified whether with hypoxia or hypercapnia: Secondary | ICD-10-CM | POA: Diagnosis not present

## 2022-06-18 MED ORDER — SEMAGLUTIDE(0.25 OR 0.5MG/DOS) 2 MG/3ML ~~LOC~~ SOPN: PEN_INJECTOR | SUBCUTANEOUS | 1 refills | Status: DC

## 2022-06-19 MED ORDER — TRULICITY 0.75 MG/0.5ML ~~LOC~~ SOAJ: 0.7500 mg | SUBCUTANEOUS | 1 refills | Status: DC

## 2022-07-04 DIAGNOSIS — J9610 Chronic respiratory failure, unspecified whether with hypoxia or hypercapnia: Secondary | ICD-10-CM | POA: Diagnosis not present

## 2022-08-03 DIAGNOSIS — J9610 Chronic respiratory failure, unspecified whether with hypoxia or hypercapnia: Secondary | ICD-10-CM | POA: Diagnosis not present

## 2022-08-17 ENCOUNTER — Other Ambulatory Visit: Payer: Self-pay | Admitting: Family Medicine

## 2022-08-17 DIAGNOSIS — E782 Mixed hyperlipidemia: Secondary | ICD-10-CM

## 2022-09-04 DIAGNOSIS — J9610 Chronic respiratory failure, unspecified whether with hypoxia or hypercapnia: Secondary | ICD-10-CM | POA: Diagnosis not present

## 2022-09-04 DIAGNOSIS — G4733 Obstructive sleep apnea (adult) (pediatric): Secondary | ICD-10-CM | POA: Diagnosis not present

## 2022-09-06 DIAGNOSIS — G4733 Obstructive sleep apnea (adult) (pediatric): Secondary | ICD-10-CM | POA: Diagnosis not present

## 2022-10-07 DIAGNOSIS — G4733 Obstructive sleep apnea (adult) (pediatric): Secondary | ICD-10-CM | POA: Diagnosis not present

## 2022-11-02 ENCOUNTER — Ambulatory Visit (INDEPENDENT_AMBULATORY_CARE_PROVIDER_SITE_OTHER): Payer: Medicare PPO

## 2022-11-02 ENCOUNTER — Ambulatory Visit: Payer: Medicare PPO | Admitting: Emergency Medicine

## 2022-11-02 ENCOUNTER — Encounter: Payer: Self-pay | Admitting: Emergency Medicine

## 2022-11-02 VITALS — BP 122/74 | HR 81 | Temp 97.9°F | Ht 65.0 in | Wt 199.0 lb

## 2022-11-02 DIAGNOSIS — J9611 Chronic respiratory failure with hypoxia: Secondary | ICD-10-CM | POA: Diagnosis not present

## 2022-11-02 DIAGNOSIS — J849 Interstitial pulmonary disease, unspecified: Secondary | ICD-10-CM

## 2022-11-02 DIAGNOSIS — J984 Other disorders of lung: Secondary | ICD-10-CM

## 2022-11-02 DIAGNOSIS — G4733 Obstructive sleep apnea (adult) (pediatric): Secondary | ICD-10-CM

## 2022-11-02 NOTE — Assessment & Plan Note (Signed)
Documented exertional hypoxemia.  She did have to use oxygen when she was doing pulmonary rehab but states that she does not see any desaturations with most of her activity at home.  I did stress to her that we want to keep her saturations 90% and that she needs to wear 3 L/min with most exertion.

## 2022-11-02 NOTE — Assessment & Plan Note (Signed)
Good compliance with her CPAP 8 cmH2O.  Plan to continue with 3 L/min bled in.  Good clinical response, less dyspnea, better sleep hygiene and quality

## 2022-11-02 NOTE — Progress Notes (Signed)
Subjective:    Patient ID: Betty [name], female    DOB: 10-Dec-1945, 77 y.o.   MRN: [id number]  HPI  ROV 11/02/2022 --77 year old woman with a history of restrictive lung disease and exertional hypoxemia, obstructive sleep apnea on CPAP.  She has undergone surgical lung biopsy that showed mild parabronchial fibrotic change without inflammation with some arteriopathic PAH.  An exercise right heart catheterization showed some diastolic dysfunction but no exercise related PAH. She does not wear her O2 w all exertion. She has noticed some slow progression of her dyspnea since last visit. She did not do any maintenance exercise after pulm rehab. She has albuterol, has not used it.   CPAP compliance report from 2/7-11/01/2022 reviewed by me.  She has used the device for greater than 4 hours a night on 100% of those days.  Her set pressure is 8 cmH2O.  Minimal leak, no apneic events noted.  She reports that she does take naps. She feels that her night time sleep quality is better, helps with dyspnea as well.     Review of Systems As per HPI  Past Medical History:  Diagnosis Date   Allergic rhinitis    never tested, fall and spring   Arthritis    hands,knees, feet   Cataracts, bilateral    not surgical yet   CHOLECYSTECTOMY, HX OF 10/08/2007   Qualifier: Diagnosis of  By: Council Mechanic MD, [name]    Chronic diastolic CHF (congestive heart failure) ([place])    Chronic respiratory failure ([place])    Diabetes mellitus without complication ([place])    Diverticulosis    problems with frequent gas, burping   Glucose intolerance (impaired glucose tolerance)    Gout    History of chicken pox    Hyperlipidemia    Hypertension    NICM (nonischemic cardiomyopathy) ([place]) 2002   EF 25%; improved to normal - echo 4/08: EF 50-55%, mild MR, mild LAE, mild TR;    cath 3/03: normal cors, EF 40%   Obesity    Oxygen deficiency 2017   at night   PONV (postoperative nausea and vomiting)    after wisdom tooth  extraction   Restrictive lung disease    Skin cancer    skin cancers only   Sleep apnea    cpap settings 4     Family History  Problem Relation Age of Onset   Lymphoma Mother 79   COPD Father        was a smoker   Stroke Father    Pneumonia Father    Diabetes Father    Hyperlipidemia Father    Heart disease Father    Heart attack Father    Hypertension Father    Diabetes Sister    Depression Sister    Liver disease Sister    Meniere's disease Brother    Hypertension Brother    Schizophrenia Daughter    Bladder Cancer Maternal Grandmother    Leukemia Maternal Grandfather    Diabetes Paternal Grandfather    Heart disease Paternal Grandfather    Colon cancer Neg Hx    Stomach cancer Neg Hx    Breast cancer Neg Hx    Colon polyps Neg Hx    Esophageal cancer Neg Hx    Rectal cancer Neg Hx      Social History   Socioeconomic History   Marital status: Married    Spouse name: Not on file   Number of children: 2   Years of education: Not  on file   Highest education level: Not on file  Occupational History   Occupation: Retired Product manager: retired   Occupation: Works at [employer] Use   Smoking status: Never   Smokeless tobacco: Never  Vaping Use   Vaping Use: Never used  Substance and Sexual Activity   Alcohol use: No    Alcohol/week: 0.0 standard drinks of alcohol   Drug use: No   Sexual activity: Not Currently  Other Topics Concern   Not on file  Social History Narrative   Lives in [place] with husband. 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 No pets.   Social Determinants of Health   Financial Resource Strain: Low Risk  (04/17/2022)   Overall Financial Resource Strain (CARDIA)    Difficulty of Paying Living Expenses: Not hard at all  Food Insecurity: No Food Insecurity (04/17/2022)   Hunger Vital Sign    Worried About Running Out of Food in the Last Year: Never true    Ran Out of Food in the Last Year: Never true   Transportation Needs: No Transportation Needs (04/17/2022)   PRAPARE - Hydrologist (Medical): No    Lack of Transportation (Non-Medical): No  Physical Activity: Inactive (04/08/2018)   Exercise Vital Sign    Days of Exercise per Week: 0 days    Minutes of Exercise per Session: 0 min  Stress: No Stress Concern Present (04/17/2022)   [place]    Feeling of Stress : Not at all  Social Connections: [place] (04/17/2022)   Social Connection and Isolation Panel [NHANES]    Frequency of Communication with Friends and Family: More than three times a week    Frequency of Social Gatherings with Friends and Family: More than three times a week    Attends Religious Services: More than 4 times per year    Active Member of [employer] or Organizations: Yes    Attends Music therapist: More than 4 times per year    Marital Status: Married  Human resources officer Violence: Not At Risk (04/17/2022)   Humiliation, Afraid, Rape, and Kick questionnaire    Fear of Current or Ex-Partner: No    Emotionally Abused: No    Physically Abused: No    Sexually Abused: No     Allergies  Allergen Reactions   Amoxicillin Rash    REACTION: RASH   Etodolac Other (See Comments)    Bloody stool    Hydrochlorothiazide Other (See Comments)    Increases gout flare   Meloxicam Other (See Comments)    constipation   Sulfa Antibiotics Other (See Comments)    rash   Allopurinol    Metformin And Related     diarrhea   Rosuvastatin Other (See Comments)    "extreme joint pain"   Sulfonamide Derivatives     REACTION: RASH   Jardiance [Empagliflozin] Other (See Comments)    Yeast infection   Lasix [Furosemide] Other (See Comments)    Pt states "it dried everything out of me"     Outpatient Medications Prior to Visit  Medication Sig Dispense Refill   aspirin 81 MG EC tablet Take 1 tablet (81 mg total) by  mouth daily. 90 tablet 2   CALCIUM-MAGNESIUM-VITAMIN D PO Take 1 tablet by mouth daily.     carvedilol (COREG) 12.5 MG tablet TAKE 1 TABLET (12.'5MG'$  TOTAL) BY MOUTH TWICE A DAY WITH MEALS 180  tablet 3   clobetasol (TEMOVATE) 0.05 % GEL Apply topically once a week.     Coenzyme Q10 (CO Q 10 PO) Take 200 mg by mouth at bedtime.      Colchicine (MITIGARE) 0.6 MG CAPS Day 1: take 1.2 mg (2 tablets) by mouth at the first sign of flare, followed by 0.6 mg (1 tablet) by mouth after 1 hour. Day 2 and thereafter: take 0.6 mg (1 tablet) by mouth daily until flare has resolved. 30 capsule 0   Dulaglutide (TRULICITY) A999333 0000000 SOPN Inject 0.75 mg into the skin once a week. 6 mL 1   ezetimibe (ZETIA) 10 MG tablet TAKE 1 TABLET BY MOUTH EVERY DAY 90 tablet 1   Fish Oil OIL Take 1,200 mg by mouth 2 (two) times daily. GUMMIES     losartan (COZAAR) 50 MG tablet TAKE 1/2 TABLET BY MOUTH EVERY DAY 45 tablet 3   multivitamin (THERAGRAN) per tablet Take 1 tablet by mouth daily.     polyvinyl alcohol (LIQUIFILM TEARS) 1.4 % ophthalmic solution Place 1 drop into both eyes every morning.     pravastatin (PRAVACHOL) 40 MG tablet Take 1 tablet (40 mg total) by mouth daily. 90 tablet 3   acetic acid 2 % otic solution Insert saturated wick of cotton; keep moist 24 hours by adding 3 to 5 drops every 4 to 6 hours; remove wick after 24 hours and instill 5 drops 3 to 4 times daily 15 mL 0   fluticasone (FLONASE) 50 MCG/ACT nasal spray SPRAY 2 SPRAYS INTO EACH NOSTRIL EVERY DAY (Patient not taking: Reported on 11/02/2022) 48 mL 2   nystatin cream (MYCOSTATIN) Apply 1 application topically 2 (two) times daily. 30 g 0   albuterol (VENTOLIN HFA) 108 (90 Base) MCG/ACT inhaler Inhale 2 puffs into the lungs every 4 (four) hours as needed for wheezing or shortness of breath. 8 g 6   No facility-administered medications prior to visit.           Objective:   Physical Exam Vitals:   11/02/22 1555  BP: 122/74  Pulse: 81   Temp: 97.9 F (36.6 C)  TempSrc: Oral  SpO2: 92%  Weight: 199 lb (90.3 kg)  Height: '5\' 5"'$  (1.651 m)   Gen: Pleasant, obese woman, in no distress,  normal affect  ENT: No lesions,  mouth clear,  oropharynx clear, no postnasal drip  Neck: No JVD, no stridor  Lungs: No use of accessory muscles, distant, no crackles or wheezing on normal respiration, no wheeze on forced expiration  Cardiovascular: RRR, heart sounds normal, no murmur or gallops, no peripheral edema  Musculoskeletal: No deformities, no cyanosis or clubbing  Neuro: alert, awake, non focal  Skin: Warm, no lesions or rash       Assessment & Plan:  Restrictive lung disease With interstitial lung disease on lung biopsy, no evidence for progression on follow-up imaging.  We will repeat her chest x-ray today.  Chronic hypoxemic respiratory failure (HCC) Documented exertional hypoxemia.  She did have to use oxygen when she was doing pulmonary rehab but states that she does not see any desaturations with most of her activity at home.  I did stress to her that we want to keep her saturations 90% and that she needs to wear 3 L/min with most exertion.  Obstructive sleep apnea Good compliance with her CPAP 8 cmH2O.  Plan to continue with 3 L/min bled in.  Good clinical response, less dyspnea, better sleep hygiene and quality  Baltazar Apo, MD, PhD 11/02/2022, 4:32 PM [facility] [phone] or if no answer before 7:00PM call [phone] For any issues after 7:00PM please call eLink [phone]

## 2022-11-02 NOTE — Assessment & Plan Note (Signed)
With interstitial lung disease on lung biopsy, no evidence for progression on follow-up imaging.  We will repeat her chest x-ray today.

## 2022-11-02 NOTE — Patient Instructions (Signed)
Please continue to use your CPAP every night with oxygen bled in as you have been doing. You should wear your oxygen 3 L/min with significant exertion.  Our goal is to keep your saturations > 90%. Chest x-ray today to compare with your priors. Keep your albuterol available to use 2 puffs if needed for shortness of breath, chest tightness, wheezing. Follow with Dr. Lamonte Sakai in 12 months or sooner if you have any problems.

## 2022-11-05 DIAGNOSIS — G4733 Obstructive sleep apnea (adult) (pediatric): Secondary | ICD-10-CM | POA: Diagnosis not present

## 2022-11-19 ENCOUNTER — Encounter: Payer: Self-pay | Admitting: Family Medicine

## 2022-11-19 ENCOUNTER — Ambulatory Visit: Payer: Medicare PPO | Admitting: Family Medicine

## 2022-11-19 VITALS — BP 126/78 | HR 83 | Temp 98.0°F | Ht 65.0 in | Wt 198.6 lb

## 2022-11-19 DIAGNOSIS — B351 Tinea unguium: Secondary | ICD-10-CM | POA: Insufficient documentation

## 2022-11-19 DIAGNOSIS — E782 Mixed hyperlipidemia: Secondary | ICD-10-CM

## 2022-11-19 DIAGNOSIS — E1121 Type 2 diabetes mellitus with diabetic nephropathy: Secondary | ICD-10-CM | POA: Diagnosis not present

## 2022-11-19 DIAGNOSIS — I5032 Chronic diastolic (congestive) heart failure: Secondary | ICD-10-CM | POA: Diagnosis not present

## 2022-11-19 LAB — COMPREHENSIVE METABOLIC PANEL
ALT: 23 U/L (ref 0–35)
AST: 23 U/L (ref 0–37)
Albumin: 4 g/dL (ref 3.5–5.2)
Alkaline Phosphatase: 76 U/L (ref 39–117)
BUN: 14 mg/dL (ref 6–23)
CO2: 35 mEq/L — ABNORMAL HIGH (ref 19–32)
Calcium: 9.6 mg/dL (ref 8.4–10.5)
Chloride: 96 mEq/L (ref 96–112)
Creatinine, Ser: 0.63 mg/dL (ref 0.40–1.20)
GFR: 86.56 mL/min (ref >60.00)
Glucose, Bld: 116 mg/dL — ABNORMAL HIGH (ref 70–99)
Potassium: 4.1 mEq/L (ref 3.5–5.1)
Sodium: 140 mEq/L (ref 135–145)
Total Bilirubin: 0.4 mg/dL (ref 0.2–1.2)
Total Protein: 7.3 g/dL (ref 6.0–8.3)

## 2022-11-19 LAB — LIPID PANEL
Cholesterol: 162 mg/dL (ref 0–200)
HDL: 67.3 mg/dL (ref >39.00)
NonHDL: 94.9
Total CHOL/HDL Ratio: 2
Triglycerides: 238 mg/dL — ABNORMAL HIGH (ref 0.0–149.0)
VLDL: 47.6 mg/dL — ABNORMAL HIGH (ref 0.0–40.0)

## 2022-11-19 LAB — POCT GLYCOSYLATED HEMOGLOBIN (HGB A1C): Hemoglobin A1C: 6.4 % — AB (ref 4.0–5.6)

## 2022-11-19 LAB — LDL CHOLESTEROL, DIRECT: Direct LDL: 58 mg/dL

## 2022-11-19 MED ORDER — CICLOPIROX 8 % EX SOLN: Freq: Every day | CUTANEOUS | 0 refills | Status: DC

## 2022-11-19 NOTE — Assessment & Plan Note (Signed)
Chronic issue.  Well-controlled.  She will continue Trulicity A999333 mg weekly.

## 2022-11-19 NOTE — Assessment & Plan Note (Signed)
Chronic issue.  She will continue pravastatin 40 mg daily.  Check labs today.

## 2022-11-19 NOTE — Progress Notes (Signed)
Betty Rumps, MD Phone: [phone]  Jayciana Lanning Haith is a 77 y.o. female who presents today for f/u.  DIABETES Disease Monitoring: Blood Sugar ranges-not checking  Polyuria/phagia/dipsia- no      Optho- UTD Medications: Compliance- taking trulicity (patient notes no side effects) hypoglycemic symptoms- no  CHF: Taking carvedilol and losartan.  No chest pain.  No change to chronic dyspnea.  Notes her swelling is better.  She has chronic orthopnea that has not changed.  No PND.  HYPERLIPIDEMIA Symptoms Chest pain on exertion:  no  Medications: Compliance- taking pravastatin Right upper quadrant pain- no  Muscle aches- no Lipid Panel     Component Value Date/Time   CHOL 190 11/14/2021 1046   TRIG 366.0 (H) 11/14/2021 1046   HDL 70.40 11/14/2021 1046   CHOLHDL 3 11/14/2021 1046   VLDL 73.2 (H) 11/14/2021 1046   LDLCALC 58 04/30/2017 1048   LDLDIRECT 51.0 04/09/2022 0948   Onychomycosis: Patient notes left pinky fingernail thickening for 6 to 8 months.  Notes bilateral great toenails with thickening and discoloration for much longer.  She wonders if there is anything to do for these things.   Social History   Tobacco Use  Smoking Status Never  Smokeless Tobacco Never    Current Outpatient Medications on File Prior to Visit  Medication Sig Dispense Refill   aspirin 81 MG EC tablet Take 1 tablet (81 mg total) by mouth daily. 90 tablet 2   CALCIUM-MAGNESIUM-VITAMIN D PO Take 1 tablet by mouth daily.     carvedilol (COREG) 12.5 MG tablet TAKE 1 TABLET (12.5MG  TOTAL) BY MOUTH TWICE A DAY WITH MEALS 180 tablet 3   clobetasol (TEMOVATE) 0.05 % GEL Apply topically once a week.     Coenzyme Q10 (CO Q 10 PO) Take 200 mg by mouth at bedtime.      Colchicine (MITIGARE) 0.6 MG CAPS Day 1: take 1.2 mg (2 tablets) by mouth at the first sign of flare, followed by 0.6 mg (1 tablet) by mouth after 1 hour. Day 2 and thereafter: take 0.6 mg (1 tablet) by mouth daily until flare has  resolved. 30 capsule 0   Dulaglutide (TRULICITY) A999333 0000000 SOPN Inject 0.75 mg into the skin once a week. 6 mL 1   ezetimibe (ZETIA) 10 MG tablet TAKE 1 TABLET BY MOUTH EVERY DAY 90 tablet 1   Fish Oil OIL Take 1,200 mg by mouth 2 (two) times daily. GUMMIES     fluticasone (FLONASE) 50 MCG/ACT nasal spray SPRAY 2 SPRAYS INTO EACH NOSTRIL EVERY DAY 48 mL 2   losartan (COZAAR) 50 MG tablet TAKE 1/2 TABLET BY MOUTH EVERY DAY 45 tablet 3   multivitamin (THERAGRAN) per tablet Take 1 tablet by mouth daily.     polyvinyl alcohol (LIQUIFILM TEARS) 1.4 % ophthalmic solution Place 1 drop into both eyes every morning.     pravastatin (PRAVACHOL) 40 MG tablet Take 1 tablet (40 mg total) by mouth daily. 90 tablet 3   No current facility-administered medications on file prior to visit.     ROS see history of present illness  Objective  Physical Exam Vitals:   11/19/22 1032  BP: 126/78  Pulse: 83  Temp: 98 F (36.7 C)  SpO2: 95%    BP Readings from Last 3 Encounters:  11/19/22 126/78  11/02/22 122/74  05/21/22 122/78   Wt Readings from Last 3 Encounters:  11/19/22 198 lb 9.6 oz (90.1 kg)  11/02/22 199 lb (90.3 kg)  05/21/22 201 lb  3.2 oz (91.3 kg)    Physical Exam Constitutional:      General: She is not in acute distress.    Appearance: She is not diaphoretic.  Cardiovascular:     Rate and Rhythm: Normal rate and regular rhythm.     Heart sounds: Normal heart sounds.  Pulmonary:     Effort: Pulmonary effort is normal.     Breath sounds: Normal breath sounds.  Musculoskeletal:     Comments: Slightly thickened and discolored left great toenail, no thickening of the left pinky fingernail though there is some separation of the nail from the underlying nailbed  Skin:    General: Skin is warm and dry.  Neurological:     Mental Status: She is alert.      Assessment/Plan: Please see individual problem list.  Chronic diastolic heart failure ([place]) Assessment & Plan: Chronic  issue.  Seems to be euvolemic.  She will continue losartan 25 mg daily and carvedilol 12.5 mg twice daily.   Controlled type 2 diabetes mellitus with diabetic nephropathy, without long-term current use of insulin ([place]) Assessment & Plan: Chronic issue.  Well-controlled.  She will continue Trulicity A999333 mg weekly.  Orders: -     POCT glycosylated hemoglobin (Hb A1C)  HYPERLIPIDEMIA, MIXED Assessment & Plan: Chronic issue.  She will continue pravastatin 40 mg daily.  Check labs today.  Orders: -     Comprehensive metabolic panel -     Lipid panel  Onychomycosis Assessment & Plan: Chronic issue.  Appears to be relatively mild.  We will trial ciclopirox topically once daily to see if that is beneficial.  Discussed this may take up to a year to be beneficial for her.  We did briefly discuss Lamisil though I noted the potential hepatic risk and we opted against using this.  Advised to monitor for skin irritation with the ciclopirox.  Orders: -     Ciclopirox; Apply topically at bedtime. Apply over nail and surrounding skin. Apply daily over previous coat. After seven (7) days, may remove with alcohol and continue cycle.  Dispense: 6.6 mL; Refill: 0    Return in about 6 months (around 05/22/2023).   Betty Rumps, MD [place]

## 2022-11-19 NOTE — Assessment & Plan Note (Signed)
Chronic issue.  Appears to be relatively mild.  We will trial ciclopirox topically once daily to see if that is beneficial.  Discussed this may take up to a year to be beneficial for her.  We did briefly discuss Lamisil though I noted the potential hepatic risk and we opted against using this.  Advised to monitor for skin irritation with the ciclopirox.

## 2022-11-19 NOTE — Assessment & Plan Note (Signed)
Chronic issue.  Seems to be euvolemic.  She will continue losartan 25 mg daily and carvedilol 12.5 mg twice daily.

## 2022-11-23 DIAGNOSIS — J9610 Chronic respiratory failure, unspecified whether with hypoxia or hypercapnia: Secondary | ICD-10-CM | POA: Diagnosis not present

## 2022-11-23 DIAGNOSIS — G4733 Obstructive sleep apnea (adult) (pediatric): Secondary | ICD-10-CM | POA: Diagnosis not present

## 2022-11-30 ENCOUNTER — Other Ambulatory Visit: Payer: Self-pay | Admitting: Family Medicine

## 2022-12-06 DIAGNOSIS — G4733 Obstructive sleep apnea (adult) (pediatric): Secondary | ICD-10-CM | POA: Diagnosis not present

## 2022-12-13 ENCOUNTER — Encounter: Payer: Self-pay | Admitting: Obstetrics and Gynecology

## 2022-12-13 ENCOUNTER — Ambulatory Visit: Payer: Medicare PPO | Admitting: Obstetrics and Gynecology

## 2022-12-13 VITALS — BP 101/68 | HR 79 | Ht 65.0 in | Wt 200.0 lb

## 2022-12-13 DIAGNOSIS — N952 Postmenopausal atrophic vaginitis: Secondary | ICD-10-CM

## 2022-12-13 DIAGNOSIS — N950 Postmenopausal bleeding: Secondary | ICD-10-CM

## 2022-12-13 DIAGNOSIS — L900 Lichen sclerosus et atrophicus: Secondary | ICD-10-CM | POA: Diagnosis not present

## 2022-12-13 MED ORDER — CLOBETASOL PROPIONATE 0.05 % EX OINT: 1.0000 | TOPICAL_OINTMENT | CUTANEOUS | 0 refills | Status: DC

## 2022-12-13 MED ORDER — ESTRADIOL 0.1 MG/GM VA CREA
0.2500 | TOPICAL_CREAM | VAGINAL | 3 refills | Status: AC
Start: 2022-12-14 — End: 2023-12-14

## 2022-12-13 NOTE — Progress Notes (Signed)
Patient presents today due to PMB. She reports 18 days of light bleeding over the last month. Continuing to use clobetasol for lichen sclerous once weekly.

## 2022-12-13 NOTE — Progress Notes (Signed)
HPI:      [name] is a 77 y.o. No obstetric history on file. who LMP was No LMP recorded. Patient is postmenopausal.  Subjective:   She presents today stating that she has had several days of postmenopausal bleeding.  It is not as heavy as it.  But not just spotting.  She states that this happened 3 and half years ago.  She is not currently sexually active. She continues to use clobetasol on her vulva for lichen sclerosus and says it is working.  She uses it 1 time per week with good effect.    Hx: The following portions of the patient's history were reviewed and updated as appropriate:             She  has a past medical history of Allergic rhinitis, Arthritis, Cataracts, bilateral, CHOLECYSTECTOMY, HX OF (10/08/2007), Chronic diastolic CHF (congestive heart failure), Chronic respiratory failure, Diabetes mellitus without complication, Diverticulosis, Glucose intolerance (impaired glucose tolerance), Gout, History of chicken pox, Hyperlipidemia, Hypertension, NICM (nonischemic cardiomyopathy) (2002), Obesity, Oxygen deficiency (2017), PONV (postoperative nausea and vomiting), Restrictive lung disease, Skin cancer, and Sleep apnea. She does not have any pertinent problems on file. She  has a past surgical history that includes nsvd; Cystectomy (1996); choleystectomy (02/2002); Vaginal delivery; Tubal ligation (1975); Dilation and curettage of uterus (2011); Induced abortion; Cardiac catheterization; Bilateral VATS ablation (Right); Colonoscopy with propofol (N/A, 12/20/2014); Breast excisional biopsy (Left, 80's); Cholecystectomy; Colonoscopy with propofol (N/A, 03/22/2020); polypectomy (03/22/2020); Cataract extraction w/PHACO (Right, 06/21/2020); and Cataract extraction w/PHACO (Left, 07/12/2020). Her family history includes Bladder Cancer in her maternal grandmother; COPD in her father; Depression in her sister; Diabetes in her father, paternal grandfather, and sister; Heart attack in her  father; Heart disease in her father and paternal grandfather; Hyperlipidemia in her father; Hypertension in her brother and father; Leukemia in her maternal grandfather; Liver disease in her sister; Lymphoma (age of onset: 44) in her mother; Meniere's disease in her brother; Pneumonia in her father; Schizophrenia in her daughter; Stroke in her father. She  reports that she has never smoked. She has never used smokeless tobacco. She reports that she does not drink alcohol and does not use drugs. She has a current medication list which includes the following prescription(s): aspirin ec, calcium-magnesium-vitamin d, carvedilol, ciclopirox, clobetasol, clobetasol ointment, coenzyme q10, colchicine, trulicity, ezetimibe, fish oil, fluticasone, losartan, multivitamin, polyvinyl alcohol, pravastatin, and [START ON 12/14/2022] estradiol. She is allergic to amoxicillin, etodolac, hydrochlorothiazide, meloxicam, sulfa antibiotics, allopurinol, metformin and related, rosuvastatin, sulfonamide derivatives, jardiance [empagliflozin], and lasix [furosemide].       Review of Systems:  Review of Systems  Constitutional: Denied constitutional symptoms, night sweats, recent illness, fatigue, fever, insomnia and weight loss.  Eyes: Denied eye symptoms, eye pain, photophobia, vision change and visual disturbance.  Ears/Nose/Throat/Neck: Denied ear, nose, throat or neck symptoms, hearing loss, nasal discharge, sinus congestion and sore throat.  Cardiovascular: Denied cardiovascular symptoms, arrhythmia, chest pain/pressure, edema, exercise intolerance, orthopnea and palpitations.  Respiratory: Denied pulmonary symptoms, asthma, pleuritic pain, productive sputum, cough, dyspnea and wheezing.  Gastrointestinal: Denied, gastro-esophageal reflux, melena, nausea and vomiting.  Genitourinary: See HPI for additional information.  Musculoskeletal: Denied musculoskeletal symptoms, stiffness, swelling, muscle weakness and myalgia.   Dermatologic: Denied dermatology symptoms, rash and scar.  Neurologic: Denied neurology symptoms, dizziness, headache, neck pain and syncope.  Psychiatric: Denied psychiatric symptoms, anxiety and depression.  Endocrine: Denied endocrine symptoms including hot flashes and night sweats.   Meds:   Current Outpatient Medications  on File Prior to Visit  Medication Sig Dispense Refill   aspirin 81 MG EC tablet Take 1 tablet (81 mg total) by mouth daily. 90 tablet 2   CALCIUM-MAGNESIUM-VITAMIN D PO Take 1 tablet by mouth daily.     carvedilol (COREG) 12.5 MG tablet TAKE 1 TABLET (12.5MG  TOTAL) BY MOUTH TWICE A DAY WITH MEALS 180 tablet 3   ciclopirox (PENLAC) 8 % solution Apply topically at bedtime. Apply over nail and surrounding skin. Apply daily over previous coat. After seven (7) days, may remove with alcohol and continue cycle. 6.6 mL 0   clobetasol (TEMOVATE) 0.05 % GEL Apply topically once a week.     Coenzyme Q10 (CO Q 10 PO) Take 200 mg by mouth at bedtime.      Colchicine (MITIGARE) 0.6 MG CAPS Day 1: take 1.2 mg (2 tablets) by mouth at the first sign of flare, followed by 0.6 mg (1 tablet) by mouth after 1 hour. Day 2 and thereafter: take 0.6 mg (1 tablet) by mouth daily until flare has resolved. 30 capsule 0   Dulaglutide (TRULICITY) 0.75 MG/0.5ML SOPN INJECT 0.75 MG SUBCUTANEOUSLY ONE TIME PER WEEK 6 mL 1   ezetimibe (ZETIA) 10 MG tablet TAKE 1 TABLET BY MOUTH EVERY DAY 90 tablet 1   Fish Oil OIL Take 1,200 mg by mouth 2 (two) times daily. GUMMIES     fluticasone (FLONASE) 50 MCG/ACT nasal spray SPRAY 2 SPRAYS INTO EACH NOSTRIL EVERY DAY 48 mL 2   losartan (COZAAR) 50 MG tablet TAKE 1/2 TABLET BY MOUTH EVERY DAY 45 tablet 3   multivitamin (THERAGRAN) per tablet Take 1 tablet by mouth daily.     polyvinyl alcohol (LIQUIFILM TEARS) 1.4 % ophthalmic solution Place 1 drop into both eyes every morning.     pravastatin (PRAVACHOL) 40 MG tablet Take 1 tablet (40 mg total) by mouth daily. 90  tablet 3   No current facility-administered medications on file prior to visit.      Objective:     Vitals:   12/13/22 0843  BP: 101/68  Pulse: 79   Filed Weights   12/13/22 0843  Weight: 200 lb (90.7 kg)              Physical examination   Pelvic:   Vulva: Normal appearance.  No lesions.  Vagina: No lesions or abnormalities noted.  Vaginal atrophy  Support: Normal pelvic support.  Urethra No masses tenderness or scarring.  Meatus Normal size without lesions or prolapse.  Cervix: Normal appearance.  No lesions.  Anus: Normal exam.  No lesions.  Perineum: Normal exam.  No lesions.             Assessment:    No obstetric history on file. Patient Active Problem List   Diagnosis Date Noted   Onychomycosis 11/19/2022   Elevated TSH 11/14/2021   Abdominal discomfort in right upper quadrant 11/14/2021   Venous insufficiency 05/17/2021   Benign neoplasm of sigmoid colon    Pulmonary HTN 10/27/2019   Restrictive lung disease 08/06/2016   Left-sided low back pain with left-sided sciatica 05/01/2016   Benign neoplasm of ascending colon    Benign neoplasm of transverse colon    Chronic diastolic heart failure 04/08/2014   Obesity (BMI 30-39.9) 04/09/2013   Diabetes mellitus type 2, controlled 01/07/2013   Edema 01/07/2013   Chronic hypoxemic respiratory failure 11/11/2012   Obstructive sleep apnea 11/11/2012   GOUT 12/26/2009   ALLERGIC RHINITIS, SEASONAL 10/08/2007   HYPERLIPIDEMIA, MIXED 05/02/2007  1. PMB (postmenopausal bleeding)   2. Lichen sclerosus   3. Vaginal atrophy     Patient has postmenopausal bleeding.  This occurred 3 and half years ago.  At that time she underwent ultrasound and endometrial biopsy endometrial biopsy was negative for hyperplasia or malignancy.  She has had no bleeding since that time.   Plan:            1.  We have discussed multiple options for this postmenopausal bleeding.  Ultrasound, endometrial biopsy, or use of vaginal  estrogen assuming that the problem is atrophic vaginitis.  Because she so recently had an endometrial biopsy I believe her absolute endometrial cancer risk is low.  I explained to her that it is not impossible but she does have a low risk.  I have offered her both endometrial biopsy and ultrasound today.  She has chosen use of estrogen vaginal cream and the expectation that her bleeding will resolve.  If she has any further vaginal bleeding she will inform us and she knows that that time she must use between ultrasound or endometrial biopsy.  Orders No orders of the defined types were placed in this encounter.    Meds ordered this encounter  Medications   clobetasol ointment (TEMOVATE) 0.05 %    Sig: Apply 1 Application topically once a week.    Dispense:  30 g    Refill:  0   estradiol (ESTRACE) 0.1 MG/GM vaginal cream    Sig: Place 0.25 Applicatorfuls vaginally 3 (three) times a week.    Dispense:  90 g    Refill:  3      F/U  Return in about 6 months (around 06/14/2023) for Pt to contact us if symptoms worsen. I spent 22 minutes involved in the care of this patient preparing to see the patient by obtaining and reviewing her medical history (including labs, imaging tests and prior procedures), documenting clinical information in the electronic health record (EHR), counseling and coordinating care plans, writing and sending prescriptions, ordering tests or procedures and in direct communicating with the patient and medical staff discussing pertinent items from her history and physical exam.  Elonda Husky, M.D. 12/13/2022 9:11 AM

## 2022-12-22 ENCOUNTER — Other Ambulatory Visit: Payer: Self-pay | Admitting: Family Medicine

## 2022-12-22 DIAGNOSIS — E782 Mixed hyperlipidemia: Secondary | ICD-10-CM

## 2023-01-05 DIAGNOSIS — G4733 Obstructive sleep apnea (adult) (pediatric): Secondary | ICD-10-CM | POA: Diagnosis not present

## 2023-02-05 DIAGNOSIS — G4733 Obstructive sleep apnea (adult) (pediatric): Secondary | ICD-10-CM | POA: Diagnosis not present

## 2023-02-11 ENCOUNTER — Other Ambulatory Visit: Payer: Self-pay | Admitting: Family Medicine

## 2023-02-11 DIAGNOSIS — Z1231 Encounter for screening mammogram for malignant neoplasm of breast: Secondary | ICD-10-CM

## 2023-02-18 DIAGNOSIS — G4733 Obstructive sleep apnea (adult) (pediatric): Secondary | ICD-10-CM | POA: Diagnosis not present

## 2023-02-18 DIAGNOSIS — J9610 Chronic respiratory failure, unspecified whether with hypoxia or hypercapnia: Secondary | ICD-10-CM | POA: Diagnosis not present

## 2023-03-02 ENCOUNTER — Other Ambulatory Visit: Payer: Self-pay | Admitting: Obstetrics and Gynecology

## 2023-03-02 ENCOUNTER — Other Ambulatory Visit (HOSPITAL_COMMUNITY): Payer: Self-pay | Admitting: Cardiology

## 2023-03-02 DIAGNOSIS — L900 Lichen sclerosus et atrophicus: Secondary | ICD-10-CM

## 2023-03-07 ENCOUNTER — Telehealth: Payer: Self-pay | Admitting: Cardiology

## 2023-03-07 DIAGNOSIS — G4733 Obstructive sleep apnea (adult) (pediatric): Secondary | ICD-10-CM | POA: Diagnosis not present

## 2023-03-07 NOTE — Telephone Encounter (Signed)
Pt requesting Provider Switch to Dr. Myriam Forehand being that the [facility] office is closer for her. Please advise

## 2023-03-15 ENCOUNTER — Ambulatory Visit
Admission: RE | Admit: 2023-03-15 | Discharge: 2023-03-15 | Disposition: A | Discharge status: Home / Self Care | Payer: Medicare PPO | Source: Ambulatory Visit | Attending: Family Medicine | Admitting: Family Medicine

## 2023-03-15 DIAGNOSIS — Z1231 Encounter for screening mammogram for malignant neoplasm of breast: Secondary | ICD-10-CM | POA: Insufficient documentation

## 2023-03-29 ENCOUNTER — Encounter: Payer: Self-pay | Admitting: Cardiology

## 2023-03-29 ENCOUNTER — Ambulatory Visit: Payer: Medicare PPO | Admitting: Cardiology

## 2023-03-29 VITALS — BP 118/76 | HR 93 | Ht 65.0 in | Wt 202.1 lb

## 2023-03-29 DIAGNOSIS — I428 Other cardiomyopathies: Secondary | ICD-10-CM

## 2023-03-29 DIAGNOSIS — R600 Localized edema: Secondary | ICD-10-CM

## 2023-03-29 DIAGNOSIS — I10 Essential (primary) hypertension: Secondary | ICD-10-CM

## 2023-03-29 DIAGNOSIS — R0609 Other forms of dyspnea: Secondary | ICD-10-CM | POA: Diagnosis not present

## 2023-03-29 MED ORDER — FUROSEMIDE 20 MG PO TABS: 20.0000 mg | ORAL_TABLET | Freq: Every day | ORAL | 3 refills | Status: DC

## 2023-03-29 NOTE — Progress Notes (Signed)
Cardiology Office Note:    Date:  03/29/2023   ID:  Betty, [name] June 22, 1946, MRN [id number]  PCP:  Glori Luis, MD    [facility] Providers Cardiologist:  Debbe Odea, MD Sleep Medicine:  Armanda Magic, MD     Referring MD: Glori Luis, MD   Chief Complaint  Patient presents with   Follow-up    12 month f/u c/o sob and edema ankles. Meds reviewed verbally with pt.    History of Present Illness:    Betty [name] is a 77 y.o. female with a hx of NICM (initial EF 25%--->normalized 55-60%), HFpEF, LHC 2013 nl coronary arteries, OSA on CPAP, ILD, chronic hypoxemic respiratory failure on O2 nightly who presents with leg edema and shortness of breath.  Patient states having shortness of breath worsening over the past year or so.  Also endorses leg edema.  Was previously on Lasix 40 mg daily roughly 4 years ago, this was stopped due to dehydration.  She endorses eating lots of salty foods.  She is compliant with medications as prescribed.  He denies chest pain.  Follows up with pulmonary medicine for restrictive lung disease and hypoxemic respiratory failure.  Prior notes/studies Echo 2019 EF 55 to 60% Left heart cath 2013 normal coronary arteries  Past Medical History:  Diagnosis Date   Allergic rhinitis    never tested, fall and spring   Arthritis    hands,knees, feet   Cataracts, bilateral    not surgical yet   CHOLECYSTECTOMY, HX OF 10/08/2007   Qualifier: Diagnosis of  By: Hetty Ely MD, Franne Grip    Chronic diastolic CHF (congestive heart failure) (HCC)    Chronic respiratory failure (HCC)    Diabetes mellitus without complication (HCC)    Diverticulosis    problems with frequent gas, burping   Glucose intolerance (impaired glucose tolerance)    Gout    History of chicken pox    Hyperlipidemia    Hypertension    NICM (nonischemic cardiomyopathy) (HCC) 2002   EF 25%; improved to normal - echo 4/08: EF 50-55%, mild MR, mild LAE, mild  TR;    cath 3/03: normal cors, EF 40%   Obesity    Oxygen deficiency 2017   at night   PONV (postoperative nausea and vomiting)    after wisdom tooth extraction   Restrictive lung disease    Skin cancer    skin cancers only   Sleep apnea    cpap settings 4    Past Surgical History:  Procedure Laterality Date   BILATERAL VATS ABLATION Right    biopsy done 2015- [facility]   BREAST EXCISIONAL BIOPSY Left 80's   NEG   CARDIAC CATHETERIZATION     CATARACT EXTRACTION W/PHACO Right 06/21/2020   Procedure: CATARACT EXTRACTION PHACO AND INTRAOCULAR LENS PLACEMENT (IOC) RIGHT DIABETIC;  Surgeon: Galen Manila, MD;  Location: [facility];  Service: Ophthalmology;  Laterality: Right;  4.06 0:32.8   CATARACT EXTRACTION W/PHACO Left 07/12/2020   Procedure: CATARACT EXTRACTION PHACO AND INTRAOCULAR LENS PLACEMENT (IOC) LEFT DIABETIC 6.96 00:46.3;  Surgeon: Galen Manila, MD;  Location: [facility];  Service: Ophthalmology;  Laterality: Left;  Diabetic - oral meds   CHOLECYSTECTOMY     choleystectomy  02/2002   COLONOSCOPY WITH PROPOFOL N/A 12/20/2014   Procedure: COLONOSCOPY WITH PROPOFOL;  Surgeon: Beverley Fiedler, MD;  Location: [facility];  Service: Gastroenterology;  Laterality: N/A;   COLONOSCOPY WITH PROPOFOL N/A 03/22/2020   Procedure: COLONOSCOPY WITH  PROPOFOL;  Surgeon: Beverley Fiedler, MD;  Location: [facility];  Service: Gastroenterology;  Laterality: N/A;   CYSTECTOMY  1996   l breast   DILATION AND CURETTAGE OF UTERUS  2011   [facility] Marice [facility]   INDUCED ABORTION     nsvd     x 2   POLYPECTOMY  03/22/2020   Procedure: POLYPECTOMY;  Surgeon: Beverley Fiedler, MD;  Location: [facility];  Service: Gastroenterology;;   TUBAL LIGATION  1975   VAGINAL DELIVERY     x2    Current Medications: Current Meds  Medication Sig   aspirin 81 MG EC tablet Take 1 tablet (81 mg total) by mouth daily.   CALCIUM-MAGNESIUM-VITAMIN D PO Take 1 tablet by mouth daily.    carvedilol (COREG) 12.5 MG tablet TAKE 1 TABLET (12.5MG  TOTAL) BY MOUTH TWICE A DAY WITH MEALS   Coenzyme Q10 (CO Q 10 PO) Take 200 mg by mouth at bedtime.    Colchicine (MITIGARE) 0.6 MG CAPS Day 1: take 1.2 mg (2 tablets) by mouth at the first sign of flare, followed by 0.6 mg (1 tablet) by mouth after 1 hour. Day 2 and thereafter: take 0.6 mg (1 tablet) by mouth daily until flare has resolved. (Patient taking differently: daily as needed. Day 1: take 1.2 mg (2 tablets) by mouth at the first sign of flare, followed by 0.6 mg (1 tablet) by mouth after 1 hour. Day 2 and thereafter: take 0.6 mg (1 tablet) by mouth daily until flare has resolved.)   Dulaglutide (TRULICITY) 0.75 MG/0.5ML SOPN INJECT 0.75 MG SUBCUTANEOUSLY ONE TIME PER WEEK   estradiol (ESTRACE) 0.1 MG/GM vaginal cream Place 0.25 Applicatorfuls vaginally 3 (three) times a week.   ezetimibe (ZETIA) 10 MG tablet TAKE 1 TABLET BY MOUTH EVERY DAY   Fish Oil OIL Take 1,200 mg by mouth 2 (two) times daily. GUMMIES   fluticasone (FLONASE) 50 MCG/ACT nasal spray SPRAY 2 SPRAYS INTO EACH NOSTRIL EVERY DAY   furosemide (LASIX) 20 MG tablet Take 1 tablet (20 mg total) by mouth daily.   losartan (COZAAR) 50 MG tablet TAKE 1/2 TABLET BY MOUTH EVERY DAY   multivitamin (THERAGRAN) per tablet Take 1 tablet by mouth daily.   OXYGEN Inhale 3 L into the lungs at bedtime.   polyvinyl alcohol (LIQUIFILM TEARS) 1.4 % ophthalmic solution Place 1 drop into both eyes every morning.   pravastatin (PRAVACHOL) 40 MG tablet Take 1 tablet (40 mg total) by mouth daily.     Allergies:   Amoxicillin, Etodolac, Hydrochlorothiazide, Meloxicam, Sulfa antibiotics, Allopurinol, Metformin and related, Rosuvastatin, Sulfonamide derivatives, Jardiance [empagliflozin], and Lasix [furosemide]   Social History   Socioeconomic History   Marital status: Married    Spouse name: Not on file   Number of children: 2   Years of education: Not on file   Highest education level:  Bachelor's degree (e.g., BA, AB, BS)  Occupational History   Occupation: Retired Magazine features editor: retired   Occupation: Works at [employer]  Tobacco Use   Smoking status: Never   Smokeless tobacco: Never  Vaping Use   Vaping status: Never Used  Substance and Sexual Activity   Alcohol use: No    Alcohol/week: 0.0 standard drinks of alcohol   Drug use: No   Sexual activity: Not Currently    Birth control/protection: Post-menopausal  Other Topics Concern   Not on file  Social History Narrative   Lives in [place] with husband. Worked at  teacher at [employer]. 2 children. No pets.   Social Determinants of Health   Financial Resource Strain: Low Risk  (11/18/2022)   Overall Financial Resource Strain (CARDIA)    Difficulty of Paying Living Expenses: Not hard at all  Food Insecurity: No Food Insecurity (11/18/2022)   Hunger Vital Sign    Worried About Running Out of Food in the Last Year: Never true    Ran Out of Food in the Last Year: Never true  Transportation Needs: No Transportation Needs (11/18/2022)   PRAPARE - Administrator, Civil Service (Medical): No    Lack of Transportation (Non-Medical): No  Physical Activity: Unknown (11/18/2022)   Exercise Vital Sign    Days of Exercise per Week: 0 days    Minutes of Exercise per Session: Not on file  Stress: No Stress Concern Present (11/18/2022)   [employer] of Occupational Health - Occupational Stress Questionnaire    Feeling of Stress : Not at all  Social Connections: Socially Integrated (11/18/2022)   Social Connection and Isolation Panel [NHANES]    Frequency of Communication with Friends and Family: More than three times a week    Frequency of Social Gatherings with Friends and Family: More than three times a week    Attends Religious Services: More than 4 times per year    Active Member of [employer] or Organizations: Yes    Attends Engineer, structural: More than 4 times per year     Marital Status: Married     Family History: The patient's family history includes Bladder Cancer in her maternal grandmother; COPD in her father; Depression in her sister; Diabetes in her father, paternal grandfather, and sister; Heart attack in her father; Heart disease in her father and paternal grandfather; Hyperlipidemia in her father; Hypertension in her brother and father; Leukemia in her maternal grandfather; Liver disease in her sister; Lymphoma (age of onset: 79) in her mother; Meniere's disease in her brother; Pneumonia in her father; Schizophrenia in her daughter; Stroke in her father. There is no history of Colon cancer, Stomach cancer, Breast cancer, Colon polyps, Esophageal cancer, or Rectal cancer.  ROS:   Please see the history of present illness.     All other systems reviewed and are negative.  EKGs/Labs/Other Studies Reviewed:    The following studies were reviewed today:  EKG Interpretation Date/Time:  Friday March 29 2023 14:43:47 EDT Ventricular Rate:  93 PR Interval:  174 QRS Duration:  100 QT Interval:  352 QTC Calculation: 437 R Axis:   -58  Text Interpretation: Normal sinus rhythm Left anterior fascicular block Minimal voltage criteria for LVH, may be normal variant ( Cornell product ) Confirmed by Debbe Odea ([id number]) on 03/29/2023 3:21:59 PM    Recent Labs: 11/19/2022: ALT 23; BUN 14; Creatinine, Ser 0.63; Potassium 4.1; Sodium 140  Recent Lipid Panel    Component Value Date/Time   CHOL 162 11/19/2022 1049   TRIG 238.0 (H) 11/19/2022 1049   HDL 67.30 11/19/2022 1049   CHOLHDL 2 11/19/2022 1049   VLDL 47.6 (H) 11/19/2022 1049   LDLCALC 58 04/30/2017 1048   LDLDIRECT 58.0 11/19/2022 1049     Risk Assessment/Calculations:             Physical Exam:    VS:  BP 118/76 (BP Location: Left Arm, Patient Position: Sitting, Cuff Size: Large)   Pulse 93   Ht 5\' 5"  (1.651 m)   Wt 202 lb 2 oz (91.7 kg)  SpO2 (!) 80%   BMI 33.64 kg/m     Wt  Readings from Last 3 Encounters:  03/29/23 202 lb 2 oz (91.7 kg)  12/13/22 200 lb (90.7 kg)  11/19/22 198 lb 9.6 oz (90.1 kg)     GEN:  Well nourished, well developed in no acute distress HEENT: Normal NECK: No JVD; No carotid bruits CARDIAC: RRR, no murmurs, rubs, gallops RESPIRATORY:  Clear to auscultation without rales, wheezing or rhonchi  ABDOMEN: Soft, non-tender, non-distended MUSCULOSKELETAL:  2+ edema; No deformity  SKIN: Warm and dry NEUROLOGIC:  Alert and oriented x 3 PSYCHIATRIC:  Normal affect   ASSESSMENT:    1. NICM (nonischemic cardiomyopathy) (HCC)   2. Dyspnea on exertion   3. Primary hypertension   4. Bilateral leg edema    PLAN:    In order of problems listed above:  Nonischemic cardiomyopathy, last EF 2019 55 to 60%.  Patient appears volume overloaded with 2+ leg edema.  Start Lasix 20 mg daily, check BMP in 10 days.  Repeat echocardiogram.  Continue Coreg 12.5 mg twice daily, losartan 25 mg daily. Dyspnea on exertion, history of ILD, chronic respiratory failure.  Also with diastolic dysfunction and volume overload as noted above.  Lasix and echo as above.  Pulm etiology may be contributing.  Previous left heart cath with no CAD. Hypertension, BP controlled.  Continue Coreg, losartan. Bilateral leg edema, left greater than right.  Start Lasix 20 mg daily.  Obtain lower extremity ultrasound to rule out DVT.      Follow-up after echo and lower extremity ultrasound  Medication Adjustments/Labs and Tests Ordered: Current medicines are reviewed at length with the patient today.  Concerns regarding medicines are outlined above.  Orders Placed This Encounter  Procedures   Basic Metabolic Panel (BMET)   EKG 12-Lead   ECHOCARDIOGRAM COMPLETE   VAS [place] LOWER EXTREMITY VENOUS (DVT)   Meds ordered this encounter  Medications   furosemide (LASIX) 20 MG tablet    Sig: Take 1 tablet (20 mg total) by mouth daily.    Dispense:  90 tablet    Refill:  3     Patient Instructions  Medication Instructions:   START Furosemide - Take one tablet (20mg ) by mouth daily.   *If you need a refill on your cardiac medications before your next appointment, please call your pharmacy*   Lab Work:  Your provider would like for you to return in 1 week ( week of 04/08/2023) to have the following labs drawn: BMP.   Please go to [facility] [address] ([facility]) [address], [place] [address] You do not need an appointment.  They are open from 7:30 am-4 pm.  Lunch from 1:00 pm- 2:00 pm You WILL NOT need to be fasting.   If you have labs (blood work) drawn today and your tests are completely normal, you will receive your results only by: MyChart Message (if you have MyChart) OR A paper copy in the mail If you have any lab test that is abnormal or we need to change your treatment, we will call you to review the results.   Testing/Procedures:  Your physician has requested that you have an echocardiogram. Echocardiography is a painless test that uses sound waves to create images of your heart. It provides your doctor with information about the size and shape of your heart and how well your heart's chambers and valves are working. This procedure takes approximately one hour. There are no restrictions for this  procedure. Please do NOT wear cologne, perfume, aftershave, or lotions (deodorant is allowed). Please arrive 15 minutes prior to your appointment time.  Your physician has requested that you have a Lower extremity venous duplex. This test is an ultrasound of the veins in the legs or arms. It looks at venous blood flow that carries blood from the heart to the legs or arms. Allow one hour for a Lower Venous exam. Allow thirty minutes for an Upper Venous exam. There are no restrictions or special instructions.    Follow-Up: At [facility], you and your health needs are our priority.  As part of our continuing mission  to provide you with exceptional heart care, we have created designated Provider Care Teams.  These Care Teams include your primary Cardiologist (physician) and Advanced Practice Providers (APPs -  Physician Assistants and Nurse Practitioners) who all work together to provide you with the care you need, when you need it.  We recommend signing up for the patient portal called "MyChart".  Sign up information is provided on this After Visit Summary.  MyChart is used to connect with patients for Virtual Visits (Telemedicine).  Patients are able to view lab/test results, encounter notes, upcoming appointments, etc.  Non-urgent messages can be sent to your provider as well.   To learn more about what you can do with MyChart, go to [email or web address].    Your next appointment:    After Echocardiogram  Provider:   You may see Debbe Odea, MD or one of the following Advanced Practice Providers on your designated Care Team:   Nicolasa Ducking, NP Eula Listen, PA-C Cadence Fransico Michael, PA-C Charlsie Quest, NP    Signed, Debbe Odea, MD  03/29/2023 3:44 PM    [facility]

## 2023-03-29 NOTE — Patient Instructions (Addendum)
Medication Instructions:   START Furosemide - Take one tablet (20mg ) by mouth daily.   *If you need a refill on your cardiac medications before your next appointment, please call your pharmacy*   Lab Work:  Your provider would like for you to return in 1 week ( week of 04/08/2023) to have the following labs drawn: BMP.   Please go to [facility] [address] ([facility]) [address], [place] [address] You do not need an appointment.  They are open from 7:30 am-4 pm.  Lunch from 1:00 pm- 2:00 pm You WILL NOT need to be fasting.   If you have labs (blood work) drawn today and your tests are completely normal, you will receive your results only by: MyChart Message (if you have MyChart) OR A paper copy in the mail If you have any lab test that is abnormal or we need to change your treatment, we will call you to review the results.   Testing/Procedures:  Your physician has requested that you have an echocardiogram. Echocardiography is a painless test that uses sound waves to create images of your heart. It provides your doctor with information about the size and shape of your heart and how well your heart's chambers and valves are working. This procedure takes approximately one hour. There are no restrictions for this procedure. Please do NOT wear cologne, perfume, aftershave, or lotions (deodorant is allowed). Please arrive 15 minutes prior to your appointment time.  Your physician has requested that you have a Lower extremity venous duplex. This test is an ultrasound of the veins in the legs or arms. It looks at venous blood flow that carries blood from the heart to the legs or arms. Allow one hour for a Lower Venous exam. Allow thirty minutes for an Upper Venous exam. There are no restrictions or special instructions.    Follow-Up: At [facility], you and your health needs are our priority.  As part of our continuing mission to provide you with  exceptional heart care, we have created designated Provider Care Teams.  These Care Teams include your primary Cardiologist (physician) and Advanced Practice Providers (APPs -  Physician Assistants and Nurse Practitioners) who all work together to provide you with the care you need, when you need it.  We recommend signing up for the patient portal called "MyChart".  Sign up information is provided on this After Visit Summary.  MyChart is used to connect with patients for Virtual Visits (Telemedicine).  Patients are able to view lab/test results, encounter notes, upcoming appointments, etc.  Non-urgent messages can be sent to your provider as well.   To learn more about what you can do with MyChart, go to [email or web address].    Your next appointment:    After Echocardiogram  Provider:   You may see Debbe Odea, MD or one of the following Advanced Practice Providers on your designated Care Team:   Nicolasa Ducking, NP Eula Listen, PA-C Cadence Fransico Michael, PA-C Charlsie Quest, NP

## 2023-04-05 ENCOUNTER — Other Ambulatory Visit (HOSPITAL_COMMUNITY): Payer: Self-pay | Admitting: Nurse Practitioner

## 2023-04-05 NOTE — Telephone Encounter (Signed)
This is a Educational psychologist pt now. Switch provider

## 2023-04-09 DIAGNOSIS — I428 Other cardiomyopathies: Secondary | ICD-10-CM | POA: Diagnosis not present

## 2023-04-11 ENCOUNTER — Encounter (INDEPENDENT_AMBULATORY_CARE_PROVIDER_SITE_OTHER): Payer: Self-pay

## 2023-04-15 ENCOUNTER — Ambulatory Visit: Payer: Self-pay

## 2023-04-15 NOTE — Patient Outreach (Signed)
  Care Coordination   04/15/2023 Name: Betty [name] MRN: [id number] DOB: [birth year]   Care Coordination Outreach Attempts:  An unsuccessful telephone outreach was attempted today to offer the patient information about available care coordination services.  Follow Up Plan:  Additional outreach attempts will be made to offer the patient care coordination information and services.   Encounter Outcome:  No Answer   Care Coordination Interventions:  No, not indicated    Lysle Morales, BSW Social Worker [facility] Care Management  [phone]

## 2023-04-19 ENCOUNTER — Ambulatory Visit (INDEPENDENT_AMBULATORY_CARE_PROVIDER_SITE_OTHER): Payer: Medicare PPO | Admitting: *Deleted

## 2023-04-19 ENCOUNTER — Telehealth: Payer: Self-pay | Admitting: *Deleted

## 2023-04-19 VITALS — Ht 65.0 in | Wt 196.0 lb

## 2023-04-19 DIAGNOSIS — Z0000 Encounter for general adult medical examination without abnormal findings: Secondary | ICD-10-CM | POA: Diagnosis not present

## 2023-04-19 DIAGNOSIS — Z780 Asymptomatic menopausal state: Secondary | ICD-10-CM

## 2023-04-19 NOTE — Telephone Encounter (Signed)
FYI Called patient to perform her AWV. Patient complained of pain around her cheekbone area. Patient stated that she feels that it is her allergies because this is the worse time of the year for her. Patient stated that she always has sinus drainage and takes medication for this. Patient denies a fever but has a slight headache. Patient stated that she has taken Zyrtec and will use her Flonase nasal spray. Patient was advised if the allergy medication does not improve her symptoms to call the office for an appointment. Patient was given Urgent Care precautions if the office is closed and she verbally understood.

## 2023-04-19 NOTE — Telephone Encounter (Signed)
Noted  

## 2023-04-19 NOTE — Progress Notes (Signed)
Subjective:   Betty [name] is a 77 y.o. female who presents for Medicare Annual (Subsequent) preventive examination.  Visit Complete: Virtual  I connected with  Betty [name] on 04/19/23 by a audio enabled telemedicine application and verified that I am speaking with the correct person using two identifiers.  Patient Location: Home  Provider Location: Home Office  I discussed the limitations of evaluation and management by telemedicine. The patient expressed understanding and agreed to proceed.  Vital Signs: Unable to obtain new vitals due to this being a telehealth visit.   Review of Systems     Cardiac Risk Factors include: advanced age (>27men, >75 women);diabetes mellitus;hypertension;dyslipidemia     Objective:    Today's Vitals   04/19/23 0935 04/19/23 0936  Weight: 196 lb (88.9 kg)   Height: 5\' 5"  (1.651 m)   PainSc:  3    Body mass index is 32.62 kg/m.     04/19/2023    9:51 AM 04/17/2022    2:02 PM 05/04/2021    2:46 PM 04/13/2021   10:19 AM 07/12/2020    7:34 AM 06/21/2020    8:40 AM 04/12/2020    9:43 AM  Advanced Directives  Does Patient Have a Medical Advance Directive? No No No No  No No  Would patient like information on creating a medical advance directive? No - Patient declined No - Patient declined No - Patient declined No - Patient declined No - Patient declined No - Patient declined No - Patient declined    Current Medications (verified) Outpatient Encounter Medications as of 04/19/2023  Medication Sig   aspirin 81 MG EC tablet Take 1 tablet (81 mg total) by mouth daily.   CALCIUM-MAGNESIUM-VITAMIN D PO Take 1 tablet by mouth daily.   carvedilol (COREG) 12.5 MG tablet TAKE 1 TABLET (12.5MG  TOTAL) BY MOUTH TWICE A DAY WITH MEALS   Coenzyme Q10 (CO Q 10 PO) Take 200 mg by mouth at bedtime.    Colchicine (MITIGARE) 0.6 MG CAPS Day 1: take 1.2 mg (2 tablets) by mouth at the first sign of flare, followed by 0.6 mg (1 tablet) by mouth after 1 hour.  Day 2 and thereafter: take 0.6 mg (1 tablet) by mouth daily until flare has resolved. (Patient taking differently: daily as needed. Day 1: take 1.2 mg (2 tablets) by mouth at the first sign of flare, followed by 0.6 mg (1 tablet) by mouth after 1 hour. Day 2 and thereafter: take 0.6 mg (1 tablet) by mouth daily until flare has resolved.)   Dulaglutide (TRULICITY) 0.75 MG/0.5ML SOPN INJECT 0.75 MG SUBCUTANEOUSLY ONE TIME PER WEEK   estradiol (ESTRACE) 0.1 MG/GM vaginal cream Place 0.25 Applicatorfuls vaginally 3 (three) times a week.   ezetimibe (ZETIA) 10 MG tablet TAKE 1 TABLET BY MOUTH EVERY DAY   Fish Oil OIL Take 1,200 mg by mouth 2 (two) times daily. GUMMIES   fluticasone (FLONASE) 50 MCG/ACT nasal spray SPRAY 2 SPRAYS INTO EACH NOSTRIL EVERY DAY   furosemide (LASIX) 20 MG tablet Take 1 tablet (20 mg total) by mouth daily.   losartan (COZAAR) 50 MG tablet TAKE 1/2 TABLET BY MOUTH EVERY DAY   multivitamin (THERAGRAN) per tablet Take 1 tablet by mouth daily.   OXYGEN Inhale 3 L into the lungs at bedtime.   polyvinyl alcohol (LIQUIFILM TEARS) 1.4 % ophthalmic solution Place 1 drop into both eyes every morning.   pravastatin (PRAVACHOL) 40 MG tablet Take 1 tablet (40 mg total) by mouth daily.  No facility-administered encounter medications on file as of 04/19/2023.    Allergies (verified) Amoxicillin, Etodolac, Hydrochlorothiazide, Meloxicam, Sulfa antibiotics, Allopurinol, Metformin and related, Rosuvastatin, Sulfonamide derivatives, Jardiance [empagliflozin], and Lasix [furosemide]   History: Past Medical History:  Diagnosis Date   Allergic rhinitis    never tested, fall and spring   Arthritis    hands,knees, feet   Cataracts, bilateral    not surgical yet   CHOLECYSTECTOMY, HX OF 10/08/2007   Qualifier: Diagnosis of  By: Hetty Ely MD, Franne Grip    Chronic diastolic CHF (congestive heart failure) (HCC)    Chronic respiratory failure (HCC)    Diabetes mellitus without complication  (HCC)    Diverticulosis    problems with frequent gas, burping   Glucose intolerance (impaired glucose tolerance)    Gout    History of chicken pox    Hyperlipidemia    Hypertension    NICM (nonischemic cardiomyopathy) (HCC) 2002   EF 25%; improved to normal - echo 4/08: EF 50-55%, mild MR, mild LAE, mild TR;    cath 3/03: normal cors, EF 40%   Obesity    Oxygen deficiency 2017   at night   PONV (postoperative nausea and vomiting)    after wisdom tooth extraction   Restrictive lung disease    Skin cancer    skin cancers only   Sleep apnea    cpap settings 4   Past Surgical History:  Procedure Laterality Date   BILATERAL VATS ABLATION Right    biopsy done 2015- [facility]   BREAST EXCISIONAL BIOPSY Left 80's   NEG   CARDIAC CATHETERIZATION     CATARACT EXTRACTION W/PHACO Right 06/21/2020   Procedure: CATARACT EXTRACTION PHACO AND INTRAOCULAR LENS PLACEMENT (IOC) RIGHT DIABETIC;  Surgeon: Galen Manila, MD;  Location: [facility];  Service: Ophthalmology;  Laterality: Right;  4.06 0:32.8   CATARACT EXTRACTION W/PHACO Left 07/12/2020   Procedure: CATARACT EXTRACTION PHACO AND INTRAOCULAR LENS PLACEMENT (IOC) LEFT DIABETIC 6.96 00:46.3;  Surgeon: Galen Manila, MD;  Location: [facility];  Service: Ophthalmology;  Laterality: Left;  Diabetic - oral meds   CHOLECYSTECTOMY     choleystectomy  02/2002   COLONOSCOPY WITH PROPOFOL N/A 12/20/2014   Procedure: COLONOSCOPY WITH PROPOFOL;  Surgeon: Beverley Fiedler, MD;  Location: [facility];  Service: Gastroenterology;  Laterality: N/A;   COLONOSCOPY WITH PROPOFOL N/A 03/22/2020   Procedure: COLONOSCOPY WITH PROPOFOL;  Surgeon: Beverley Fiedler, MD;  Location: [facility];  Service: Gastroenterology;  Laterality: N/A;   CYSTECTOMY  1996   l breast   DILATION AND CURETTAGE OF UTERUS  2011   [facility] Mar[facility]   INDUCED ABORTION     nsvd     x 2   POLYPECTOMY  03/22/2020   Procedure: POLYPECTOMY;  Surgeon: Beverley Fiedler, MD;  Location: [facility];  Service: Gastroenterology;;   TUBAL LIGATION  1975   VAGINAL DELIVERY     x2   Family History  Problem Relation Age of Onset   Lymphoma Mother 45   COPD Father        was a smoker   Stroke Father    Pneumonia Father    Diabetes Father    Hyperlipidemia Father    Heart disease Father    Heart attack Father    Hypertension Father    Diabetes Sister    Depression Sister    Liver disease Sister    Meniere's disease Brother    Hypertension Brother  Schizophrenia Daughter    Bladder Cancer Maternal Grandmother    Leukemia Maternal Grandfather    Diabetes Paternal Grandfather    Heart disease Paternal Grandfather    Colon cancer Neg Hx    Stomach cancer Neg Hx    Breast cancer Neg Hx    Colon polyps Neg Hx    Esophageal cancer Neg Hx    Rectal cancer Neg Hx    Social History   Socioeconomic History   Marital status: Married    Spouse name: Not on file   Number of children: 2   Years of education: Not on file   Highest education level: Bachelor's degree (e.g., BA, AB, BS)  Occupational History   Occupation: Retired Magazine features editor: retired   Occupation: Works at [employer]  Tobacco Use   Smoking status: Never   Smokeless tobacco: Never  Vaping Use   Vaping status: Never Used  Substance and Sexual Activity   Alcohol use: No    Alcohol/week: 0.0 standard drinks of alcohol   Drug use: No   Sexual activity: Not Currently    Birth control/protection: Post-menopausal  Other Topics Concern   Not on file  Social History Narrative   Lives in [place] with husband. Worked at Runner, broadcasting/film/video at [employer]. 2 children. No pets.   Social Determinants of Health   Financial Resource Strain: Low Risk  (04/19/2023)   Overall Financial Resource Strain (CARDIA)    Difficulty of Paying Living Expenses: Not hard at all  Food Insecurity: No Food Insecurity (04/19/2023)   Hunger Vital Sign    Worried About Running Out of Food in the  Last Year: Never true    Ran Out of Food in the Last Year: Never true  Transportation Needs: No Transportation Needs (04/19/2023)   PRAPARE - Administrator, Civil Service (Medical): No    Lack of Transportation (Non-Medical): No  Physical Activity: Inactive (04/19/2023)   Exercise Vital Sign    Days of Exercise per Week: 0 days    Minutes of Exercise per Session: 0 min  Stress: No Stress Concern Present (04/19/2023)   [employer] of Occupational Health - Occupational Stress Questionnaire    Feeling of Stress : Only a little  Social Connections: Socially Integrated (04/19/2023)   Social Connection and Isolation Panel [NHANES]    Frequency of Communication with Friends and Family: More than three times a week    Frequency of Social Gatherings with Friends and Family: More than three times a week    Attends Religious Services: More than 4 times per year    Active Member of [employer] or Organizations: Yes    Attends Engineer, structural: More than 4 times per year    Marital Status: Married    Tobacco Counseling Counseling given: Not Answered   Clinical Intake:  Pre-visit preparation completed: Yes  Pain : 0-10 Pain Score: 3  Pain Type: Acute pain Pain Location: Eye (cheekbone and nose, thinks it is allergies) Pain Descriptors / Indicators: Aching (feels like allergies) Pain Onset: Yesterday Pain Frequency: Intermittent Pain Relieving Factors: has taken some allergy medication  Pain Relieving Factors: has taken some allergy medication  BMI - recorded: 32.62 Nutritional Status: BMI > 30  Obese Nutritional Risks: None Diabetes: Yes CBG done?: No Did pt. bring in CBG monitor from home?: No  How often do you need to have someone help you when you read instructions, pamphlets, or other written materials from your doctor or pharmacy?:  1 - Never  Interpreter Needed?: No  Information entered by :: R. Vlasta Baskin LPN   Activities of Daily Living    04/19/2023     9:40 AM  In your present state of health, do you have any difficulty performing the following activities:  Hearing? 0  Vision? 0  Comment glasses  Difficulty concentrating or making decisions? 1  Comment remembering things at times  Walking or climbing stairs? 1  Comment stairs  Dressing or bathing? 0  Doing errands, shopping? 0  Preparing Food and eating ? N  Using the Toilet? N  In the past six months, have you accidently leaked urine? Y  Comment wears a pad  Do you have problems with loss of bowel control? N  Managing your Medications? N  Managing your Finances? N  Housekeeping or managing your Housekeeping? N    Patient Care Team: Glori Luis, MD as PCP - General (Family Medicine) Quintella Reichert, MD as PCP - Sleep Medicine (Sleep Medicine) Debbe Odea, MD as PCP - Cardiology (Cardiology) Dasher, Cliffton Asters, MD (Dermatology) Lupita Leash, MD (Inactive) as Referring Physician (Internal Medicine)  Indicate any recent Medical Services you may have received from other than [facility] providers in the past year (date may be approximate).     Assessment:   This is a routine wellness examination for Betty [name].  Hearing/Vision screen Hearing Screening - Comments:: No issues Vision Screening - Comments:: glasses  Dietary issues and exercise activities discussed:     Goals Addressed             This Visit's Progress    Patient Stated       Wants to lose weight       Depression Screen    04/19/2023    9:46 AM 11/19/2022   10:35 AM 04/17/2022    2:09 PM 11/14/2021   10:34 AM 10/02/2021   10:05 AM 09/01/2021   10:37 AM 07/17/2021   10:31 AM  PHQ 2/9 Scores  PHQ - 2 Score 0 2 0 0 2 1 0  PHQ- 9 Score 6 5   7 7 4     Fall Risk    04/19/2023    9:43 AM 11/19/2022   10:35 AM 04/17/2022    2:09 PM 11/14/2021   10:34 AM 05/04/2021    2:45 PM  Fall Risk   Falls in the past year? 0 0 0 0 0  Number falls in past yr: 0 0 0 1   Injury with Fall? 0 0  0   Risk  for fall due to : No Fall Risks No Fall Risks  No Fall Risks   Follow up Falls prevention discussed;Falls evaluation completed Falls evaluation completed Falls evaluation completed Falls evaluation completed Falls evaluation completed;Education provided;Falls prevention discussed    MEDICARE RISK AT HOME: Medicare Risk at Home Any stairs in or around the home?: Yes If so, are there any without handrails?: No Home free of loose throw rugs in walkways, pet beds, electrical cords, etc?: Yes Adequate lighting in your home to reduce risk of falls?: Yes Life alert?: No Use of a cane, Face or w/c?: No Grab bars in the bathroom?: Yes Shower chair or bench in shower?: No Elevated toilet seat or a handicapped toilet?: No    Cognitive Function:    04/08/2018   11:27 AM 03/28/2017    3:23 PM 03/28/2016    3:33 PM  MMSE - Mini Mental State Exam  Orientation to time 5 5  5  Orientation to Place 5 5 5   Registration 3 3 3   Attention/ Calculation 5 5 5   Recall 3 3 3   Language- name 2 objects 2 2 2   Language- repeat 1 1 1   Language- follow 3 step command 3 3 3   Language- read & follow direction 1 1 1   Write a sentence 1 1 1   Copy design 1 1 1   Total score 30 30 30         04/19/2023    9:51 AM 04/17/2022    2:36 PM 04/12/2020    9:47 AM 04/10/2019    9:51 AM  6CIT Screen  What Year? 0 points 0 points 0 points 0 points  What month? 0 points 0 points 0 points 0 points  What time? 0 points 0 points  0 points  Count back from 20 0 points   0 points  Months in reverse 0 points 0 points 0 points 0 points  Repeat phrase 0 points  0 points 0 points  Total Score 0 points   0 points    Immunizations Immunization History  Administered Date(s) Administered   Covid-19, Mrna,Vaccine(Spikevax)80yrs and older 08/03/2022   Fluad Quad(high Dose 65+) 04/30/2019, 05/17/2021, 05/21/2022   H1N1 09/08/2008   Influenza Split 06/28/2011, 05/01/2012   Influenza Whole 07/11/2007, 06/07/2008, 07/12/2009    Influenza, High Dose Seasonal PF 05/12/2018, 05/19/2020   Influenza,inj,Quad PF,6+ Mos 04/23/2014, 04/21/2015   Influenza-Unspecified 04/27/2013, 05/04/2016, 04/24/2017   PFIZER(Purple Top)SARS-COV-2 Vaccination 09/10/2019, 10/01/2019, 05/30/2020, 12/23/2020   Pneumococcal Conjugate-13 01/21/2014   Pneumococcal Polysaccharide-23 08/28/2011, 03/06/2012   Respiratory Syncytial Virus Vaccine,Recomb Aduvanted(Arexvy) 05/23/2022   Td 10/03/2007, 10/14/2013   Tdap 10/11/2013   Zoster Recombinant(Shingrix) 04/13/2019, 05/18/2019, 07/16/2019   Zoster, Live 06/21/2010    TDAP status: Up to date  Flu Vaccine status: Up to date  Pneumococcal vaccine status: Up to date  Covid-19 vaccine status: Completed vaccines  Qualifies for Shingles Vaccine? Yes   Zostavax completed Yes   Shingrix Completed?: Yes  Screening Tests Health Maintenance  Topic Date Due   COVID-19 Vaccine (6 - 2023-24 season) 12/03/2022   OPHTHALMOLOGY EXAM  12/29/2022   Medicare Annual Wellness (AWV)  04/18/2023   INFLUENZA VACCINE  03/28/2023   Diabetic kidney evaluation - Urine ACR  05/22/2023   FOOT EXAM  05/22/2023   HEMOGLOBIN A1C  05/22/2023   DTaP/Tdap/Td (4 - Td or Tdap) 10/15/2023   Diabetic kidney evaluation - eGFR measurement  04/08/2024   Colonoscopy  03/22/2025   Pneumonia Vaccine 6+ Years old  Completed   DEXA SCAN  Completed   Hepatitis C Screening  Completed   Zoster Vaccines- Shingrix  Completed   HPV VACCINES  Aged Out    Health Maintenance  Health Maintenance Due  Topic Date Due   COVID-19 Vaccine (6 - 2023-24 season) 12/03/2022   OPHTHALMOLOGY EXAM  12/29/2022   Medicare Annual Wellness (AWV)  04/18/2023   INFLUENZA VACCINE  03/28/2023    Colorectal cancer screening: Type of screening: Colonoscopy. Completed 7/21. Repeat every 5 years  Mammogram status: Completed 7/24. Repeat every year  Bone Density status: Completed 2/16. Results reflect: Bone density results: NORMAL. Repeat every  2 years.   Lung Cancer Screening: (Low Dose CT Chest recommended if Age 8-80 years, 20 pack-year currently smoking OR have quit w/in 15years.) does not qualify.    Additional Screening:  Hepatitis C Screening: does qualify; Completed 8/16  Vision Screening: Recommended annual ophthalmology exams for early detection of glaucoma and other disorders of  the eye. Is the patient up to date with their annual eye exam?  Yes  Who is the provider or what is the name of the office in which the patient attends annual eye exams? [facility] If pt is not established with a provider, would they like to be referred to a provider to establish care? No .   Dental Screening: Recommended annual dental exams for proper oral hygiene  Diabetic Foot Exam: Diabetic Foot Exam: Completed 9/23  Community Resource Referral / Chronic Care Management: CRR required this visit?  No   CCM required this visit?  No     Plan:     I have personally reviewed and noted the following in the patient's chart:   Medical and social history Use of alcohol, tobacco or illicit drugs  Current medications and supplements including opioid prescriptions. Patient is not currently taking opioid prescriptions. Functional ability and status Nutritional status Physical activity Advanced directives List of other physicians Hospitalizations, surgeries, and ER visits in previous 12 months Vitals Screenings to include cognitive, depression, and falls Referrals and appointments  In addition, I have reviewed and discussed with patient certain preventive protocols, quality metrics, and best practice recommendations. A written personalized care plan for preventive services as well as general preventive health recommendations were provided to patient.     Sydell Axon, LPN   5/62/1308   After Visit Summary: (MyChart) Due to this being a telephonic visit, the after visit summary with patients personalized plan was offered to patient  via MyChart   Nurse Notes:Sending phone note to PCP regarding pain.

## 2023-04-19 NOTE — Patient Instructions (Signed)
Ms. Dangelo , Thank you for taking time to come for your Medicare Wellness Visit. I appreciate your ongoing commitment to your health goals. Please review the following plan we discussed and let me know if I can assist you in the future.   Referrals/Orders/Follow-Ups/Clinician Recommendations: Dexa Scan  This is a list of the screening recommended for you and due dates:  Health Maintenance  Topic Date Due   COVID-19 Vaccine (6 - 2023-24 season) 12/03/2022   Eye exam for diabetics  12/29/2022   Flu Shot  03/28/2023   Yearly kidney health urinalysis for diabetes  05/22/2023   Complete foot exam   05/22/2023   Hemoglobin A1C  05/22/2023   DTaP/Tdap/Td vaccine (4 - Td or Tdap) 10/15/2023   Mammogram  03/14/2024   Yearly kidney function blood test for diabetes  04/08/2024   Medicare Annual Wellness Visit  04/18/2024   Colon Cancer Screening  03/22/2025   Pneumonia Vaccine  Completed   DEXA scan (bone density measurement)  Completed   Hepatitis C Screening  Completed   Zoster (Shingles) Vaccine  Completed   HPV Vaccine  Aged Out    Advanced directives: (Declined) Advance directive discussed with you today. Even though you declined this today, please call our office should you change your mind, and we can give you the proper paperwork for you to fill out. Will pick paperwork up at the office to complete  Next Medicare Annual Wellness Visit scheduled for next year: Yes

## 2023-04-23 NOTE — Addendum Note (Signed)
Addended by: Binnie Kand on: 04/23/2023 08:45 AM   Modules accepted: Orders

## 2023-04-25 ENCOUNTER — Ambulatory Visit
Admission: RE | Admit: 2023-04-25 | Discharge: 2023-04-25 | Disposition: A | Discharge status: Home / Self Care | Payer: Medicare PPO | Source: Ambulatory Visit | Attending: Family Medicine | Admitting: Family Medicine

## 2023-04-25 DIAGNOSIS — Z780 Asymptomatic menopausal state: Secondary | ICD-10-CM | POA: Diagnosis not present

## 2023-05-01 ENCOUNTER — Ambulatory Visit: Payer: Medicare PPO | Attending: Cardiology

## 2023-05-01 ENCOUNTER — Ambulatory Visit (INDEPENDENT_AMBULATORY_CARE_PROVIDER_SITE_OTHER): Payer: Medicare PPO

## 2023-05-01 DIAGNOSIS — I428 Other cardiomyopathies: Secondary | ICD-10-CM

## 2023-05-01 DIAGNOSIS — R600 Localized edema: Secondary | ICD-10-CM

## 2023-05-01 LAB — ECHOCARDIOGRAM COMPLETE
AR max vel: 2.68 cm2
AV Area VTI: 2.77 cm2
AV Area mean vel: 2.63 cm2
AV Mean grad: 4.4 mmHg
AV Peak grad: 7.8 mmHg
Ao pk vel: 1.4 m/s
S' Lateral: 3.8 cm

## 2023-05-01 MED ORDER — PERFLUTREN LIPID MICROSPHERE
1.0000 mL | INTRAVENOUS | Status: AC | PRN
  Administered 2023-05-01: 2 mL via INTRAVENOUS

## 2023-05-08 ENCOUNTER — Other Ambulatory Visit: Payer: Self-pay | Admitting: Family Medicine

## 2023-05-10 ENCOUNTER — Ambulatory Visit: Payer: Medicare PPO | Admitting: Cardiology

## 2023-05-14 ENCOUNTER — Ambulatory Visit
Admission: EM | Admit: 2023-05-14 | Discharge: 2023-05-14 | Disposition: A | Discharge status: Home / Self Care | Payer: Medicare PPO | Attending: Emergency Medicine | Admitting: Emergency Medicine

## 2023-05-14 DIAGNOSIS — Z20822 Contact with and (suspected) exposure to covid-19: Secondary | ICD-10-CM | POA: Diagnosis not present

## 2023-05-14 DIAGNOSIS — J0140 Acute pansinusitis, unspecified: Secondary | ICD-10-CM | POA: Insufficient documentation

## 2023-05-14 LAB — SARS CORONAVIRUS 2 BY RT PCR: SARS Coronavirus 2 by RT PCR: NEGATIVE

## 2023-05-14 MED ORDER — DOXYCYCLINE HYCLATE 100 MG PO CAPS: 100.0000 mg | ORAL_CAPSULE | Freq: Two times a day (BID) | ORAL | 0 refills | Status: AC

## 2023-05-14 NOTE — Discharge Instructions (Signed)
We will contact you if your COVID test comes back positive and we will discuss our treatment options.  In the meantime, finish doxycycline, even if you feel better.  Start Mucinex to keep the mucous thin and to decongest you.   Stop Zyrtec.  You may take 1000 mg of tylenol up to 3 times a day as needed for pain.  Start Flonase.  Use a NeilMed sinus rinse with distilled water as often as you want to to reduce nasal congestion. Follow the directions on the box.   Follow-up with your dentist if you are still having dental pain after being on the antibiotics for 48 hours . Go to www.goodrx.com to look up your medications. This will give you a list of where you can find your prescriptions at the most affordable prices. Or you can ask the pharmacist what the cash price is. This is frequently cheaper than going through insurance.

## 2023-05-14 NOTE — ED Provider Notes (Signed)
HPI  SUBJECTIVE:  Betty [name] is a 77 y.o. female who presents with 3 days of bilateral sinus pain and pressure, worse on the left, painful left upper tooth where she has a crown, facial swelling, nasal congestion, clear rhinorrhea, mild left-sided sore throat, postnasal drip, left ear pain.  States that she has felt feverish, but has had no documented fevers.  No change in hearing, otorrhea.  No change in her baseline cough or shortness of breath.  She denies gingival swelling.  No body aches, headaches, wheezing, nausea, vomiting, diarrhea, abdominal pain.  She has tried Extra strength Tylenol and Zyrtec without improvement in her symptoms.  No aggravating factors.  No known COVID exposure.  She had 5 doses of the COVID-vaccine.  Patient has a past medical history of diabetes, chronic CHF, chronic respiratory failure on supplemental O2 at night, baseline oxygen saturation around 86%, hyperlipidemia, hypertension, pulmonary hypertension, diabetes.  No history of chronic kidney disease, frequent sinusitis.  PCP: [facility]  Past Medical History:  Diagnosis Date   Allergic rhinitis    never tested, fall and spring   Arthritis    hands,knees, feet   Cataracts, bilateral    not surgical yet   CHOLECYSTECTOMY, HX OF 10/08/2007   Qualifier: Diagnosis of  By: Hetty Ely MD, Franne Grip    Chronic diastolic CHF (congestive heart failure) (HCC)    Chronic respiratory failure (HCC)    Diabetes mellitus without complication (HCC)    Diverticulosis    problems with frequent gas, burping   Glucose intolerance (impaired glucose tolerance)    Gout    History of chicken pox    Hyperlipidemia    Hypertension    NICM (nonischemic cardiomyopathy) (HCC) 2002   EF 25%; improved to normal - echo 4/08: EF 50-55%, mild MR, mild LAE, mild TR;    cath 3/03: normal cors, EF 40%   Obesity    Oxygen deficiency 2017   at night   PONV (postoperative nausea and vomiting)    after wisdom tooth  extraction   Restrictive lung disease    Skin cancer    skin cancers only   Sleep apnea    cpap settings 4    Past Surgical History:  Procedure Laterality Date   BILATERAL VATS ABLATION Right    biopsy done 2015- [facility]   BREAST EXCISIONAL BIOPSY Left 80's   NEG   CARDIAC CATHETERIZATION     CATARACT EXTRACTION W/PHACO Right 06/21/2020   Procedure: CATARACT EXTRACTION PHACO AND INTRAOCULAR LENS PLACEMENT (IOC) RIGHT DIABETIC;  Surgeon: Galen Manila, MD;  Location: [facility];  Service: Ophthalmology;  Laterality: Right;  4.06 0:32.8   CATARACT EXTRACTION W/PHACO Left 07/12/2020   Procedure: CATARACT EXTRACTION PHACO AND INTRAOCULAR LENS PLACEMENT (IOC) LEFT DIABETIC 6.96 00:46.3;  Surgeon: Galen Manila, MD;  Location: [facility];  Service: Ophthalmology;  Laterality: Left;  Diabetic - oral meds   CHOLECYSTECTOMY     choleystectomy  02/2002   COLONOSCOPY WITH PROPOFOL N/A 12/20/2014   Procedure: COLONOSCOPY WITH PROPOFOL;  Surgeon: Beverley Fiedler, MD;  Location: [facility];  Service: Gastroenterology;  Laterality: N/A;   COLONOSCOPY WITH PROPOFOL N/A 03/22/2020   Procedure: COLONOSCOPY WITH PROPOFOL;  Surgeon: Beverley Fiedler, MD;  Location: [facility];  Service: Gastroenterology;  Laterality: N/A;   CYSTECTOMY  1996   l breast   DILATION AND CURETTAGE OF UTERUS  2011   Dr. Marice Potter at [facility]   INDUCED ABORTION     nsvd  x 2   POLYPECTOMY  03/22/2020   Procedure: POLYPECTOMY;  Surgeon: Beverley Fiedler, MD;  Location: [facility];  Service: Gastroenterology;;   TUBAL LIGATION  1975   VAGINAL DELIVERY     x2    Family History  Problem Relation Age of Onset   Lymphoma Mother 41   COPD Father        was a smoker   Stroke Father    Pneumonia Father    Diabetes Father    Hyperlipidemia Father    Heart disease Father    Heart attack Father    Hypertension Father    Diabetes Sister    Depression Sister    Liver disease Sister    Meniere's  disease Brother    Hypertension Brother    Schizophrenia Daughter    Bladder Cancer Maternal Grandmother    Leukemia Maternal Grandfather    Diabetes Paternal Grandfather    Heart disease Paternal Grandfather    Colon cancer Neg Hx    Stomach cancer Neg Hx    Breast cancer Neg Hx    Colon polyps Neg Hx    Esophageal cancer Neg Hx    Rectal cancer Neg Hx     Social History   Tobacco Use   Smoking status: Never   Smokeless tobacco: Never  Vaping Use   Vaping status: Never Used  Substance Use Topics   Alcohol use: No    Alcohol/week: 0.0 standard drinks of alcohol   Drug use: No    No current facility-administered medications for this encounter.  Current Outpatient Medications:    doxycycline (VIBRAMYCIN) 100 MG capsule, Take 1 capsule (100 mg total) by mouth 2 (two) times daily for 10 days., Disp: 20 capsule, Rfl: 0   aspirin 81 MG EC tablet, Take 1 tablet (81 mg total) by mouth daily., Disp: 90 tablet, Rfl: 2   CALCIUM-MAGNESIUM-VITAMIN D PO, Take 1 tablet by mouth daily., Disp: , Rfl:    carvedilol (COREG) 12.5 MG tablet, TAKE 1 TABLET (12.5MG  TOTAL) BY MOUTH TWICE A DAY WITH MEALS, Disp: 180 tablet, Rfl: 0   Coenzyme Q10 (CO Q 10 PO), Take 200 mg by mouth at bedtime. , Disp: , Rfl:    Colchicine (MITIGARE) 0.6 MG CAPS, Day 1: take 1.2 mg (2 tablets) by mouth at the first sign of flare, followed by 0.6 mg (1 tablet) by mouth after 1 hour. Day 2 and thereafter: take 0.6 mg (1 tablet) by mouth daily until flare has resolved. (Patient taking differently: daily as needed. Day 1: take 1.2 mg (2 tablets) by mouth at the first sign of flare, followed by 0.6 mg (1 tablet) by mouth after 1 hour. Day 2 and thereafter: take 0.6 mg (1 tablet) by mouth daily until flare has resolved.), Disp: 30 capsule, Rfl: 0   Dulaglutide (TRULICITY) 0.75 MG/0.5ML SOPN, INJECT 0.75 MG SUBCUTANEOUSLY ONE TIME PER WEEK, Disp: 6 mL, Rfl: 1   estradiol (ESTRACE) 0.1 MG/GM vaginal cream, Place 0.25  Applicatorfuls vaginally 3 (three) times a week., Disp: 90 g, Rfl: 3   ezetimibe (ZETIA) 10 MG tablet, TAKE 1 TABLET BY MOUTH EVERY DAY, Disp: 90 tablet, Rfl: 1   Fish Oil OIL, Take 1,200 mg by mouth 2 (two) times daily. GUMMIES, Disp: , Rfl:    fluticasone (FLONASE) 50 MCG/ACT nasal spray, SPRAY 2 SPRAYS INTO EACH NOSTRIL EVERY DAY, Disp: 48 mL, Rfl: 2   furosemide (LASIX) 20 MG tablet, Take 1 tablet (20 mg total) by mouth daily.,  Disp: 90 tablet, Rfl: 3   losartan (COZAAR) 50 MG tablet, TAKE 1/2 TABLET BY MOUTH EVERY DAY, Disp: 45 tablet, Rfl: 3   multivitamin (THERAGRAN) per tablet, Take 1 tablet by mouth daily., Disp: , Rfl:    OXYGEN, Inhale 3 L into the lungs at bedtime., Disp: , Rfl:    polyvinyl alcohol (LIQUIFILM TEARS) 1.4 % ophthalmic solution, Place 1 drop into both eyes every morning., Disp: , Rfl:    pravastatin (PRAVACHOL) 40 MG tablet, Take 1 tablet (40 mg total) by mouth daily., Disp: 90 tablet, Rfl: 3  Allergies  Allergen Reactions   Amoxicillin Rash    REACTION: RASH   Etodolac Other (See Comments)    Bloody stool    Hydrochlorothiazide Other (See Comments)    Increases gout flare   Meloxicam Other (See Comments)    constipation   Sulfa Antibiotics Other (See Comments)    rash   Allopurinol    Metformin And Related     diarrhea   Rosuvastatin Other (See Comments)    "extreme joint pain"   Sulfonamide Derivatives     REACTION: RASH   Jardiance [Empagliflozin] Other (See Comments)    Yeast infection   Lasix [Furosemide] Other (See Comments)    Pt states "it dried everything out of me"     ROS  As noted in HPI.   Physical Exam  BP (!) 154/89 (BP Location: Left Arm)   Pulse 77   Temp 99.1 F (37.3 C) (Oral)   Resp 16   SpO2 (!) 85%   Constitutional: Well developed, well nourished, no acute distress Eyes:  EOMI, conjunctiva normal bilaterally HENT: Normocephalic, atraumatic,mucus membranes moist.  No obvious facial swelling.  Exquisite left-sided  frontal, maxillary sinus tenderness.  Purulent nasal drainage with erythematous, swollen turbinates on the left.  Normal oropharynx.  Upper left teeth intact.  Positive tenderness at the canine tooth.  No appreciable gingival swelling, expressible purulent drainage.  Left EAC, external ear, TM normal. Neck: No cervical lymphadenopathy Respiratory: Normal inspiratory effort, lungs clear bilaterally. Cardiovascular: Normal rate GI: nondistended skin: No rash, skin intact Musculoskeletal: no deformities Neurologic: Alert & oriented x 3, no focal neuro deficits Psychiatric: Speech and behavior appropriate   ED Course   Medications - No data to display  Orders Placed This Encounter  Procedures   SARS Coronavirus 2 by RT PCR (hospital order, performed in [facility] hospital lab) *cepheid single result test* Anterior Nasal Swab    Standing Status:   Standing    Number of Occurrences:   1   Recheck vitals    O2 sat    Standing Status:   Standing    Number of Occurrences:   1    Results for orders placed or performed during the hospital encounter of 05/14/23 (from the past 24 hour(s))  SARS Coronavirus 2 by RT PCR (hospital order, performed in [facility] hospital lab) *cepheid single result test* Anterior Nasal Swab     Status: None   Collection Time: 05/14/23  2:50 PM   Specimen: Anterior Nasal Swab  Result Value Ref Range   SARS Coronavirus 2 by RT PCR NEGATIVE NEGATIVE   No results found.  ED Clinical Impression  1. Acute non-recurrent pansinusitis   2. Encounter for laboratory testing for COVID-19 virus      ED Assessment/Plan      Checking COVID.  Patient is a candidate for antivirals if COVID is positive.  Will contact patient or her husband Ronaldo Miyamoto if  positive.  In the meantime, she qualifies for antibiotics for sinusitis given severity of symptoms.  Will send home with doxycycline.  She reports a rash with amoxicillin..  She is to start Mucinex, discontinue Zyrtec, start  saline nasal irrigation, and continue extra strength Tylenol 1000 mg 3 times a day.  Follow-up with dentist in 48 hours if still having dental pain.  Repeat O2 sat: 85%.  Patient is having no respiratory issues.-Denies a change in her baseline shortness of breath or cough and her lungs are clear.  She did just get a recent manicure, I suspect that the low O2 sat reading is partially due to the opaque nail polish she has on.  She does not have increased work of breathing.  COVID negative.  Plan as above.  Discussed labs, MDM, treatment plan, and plan for follow-up with patient. Discussed sn/sx that should prompt return to the ED. patient agrees with plan.   Meds ordered this encounter  Medications   doxycycline (VIBRAMYCIN) 100 MG capsule    Sig: Take 1 capsule (100 mg total) by mouth 2 (two) times daily for 10 days.    Dispense:  20 capsule    Refill:  0      *This clinic note was created using Scientist, clinical (histocompatibility and immunogenetics). Therefore, there may be occasional mistakes despite careful proofreading.  ?    Domenick Gong, MD 05/17/23 [phone]

## 2023-05-14 NOTE — ED Triage Notes (Addendum)
Patient presents to [facility] for possible sinus infection. She reports facial pressure and low grade fever since sat. Treating with zyrtec and tylenol. States she has a hx of HF so has SOB but not any different then her baseline. Per spouse states baseline 86%.  Denies chest pain.

## 2023-05-20 DIAGNOSIS — D2262 Melanocytic nevi of left upper limb, including shoulder: Secondary | ICD-10-CM | POA: Diagnosis not present

## 2023-05-20 DIAGNOSIS — D2272 Melanocytic nevi of left lower limb, including hip: Secondary | ICD-10-CM | POA: Diagnosis not present

## 2023-05-20 DIAGNOSIS — D225 Melanocytic nevi of trunk: Secondary | ICD-10-CM | POA: Diagnosis not present

## 2023-05-20 DIAGNOSIS — L570 Actinic keratosis: Secondary | ICD-10-CM | POA: Diagnosis not present

## 2023-05-20 DIAGNOSIS — X32XXXA Exposure to sunlight, initial encounter: Secondary | ICD-10-CM | POA: Diagnosis not present

## 2023-05-20 DIAGNOSIS — Z85828 Personal history of other malignant neoplasm of skin: Secondary | ICD-10-CM | POA: Diagnosis not present

## 2023-05-20 DIAGNOSIS — D2271 Melanocytic nevi of right lower limb, including hip: Secondary | ICD-10-CM | POA: Diagnosis not present

## 2023-05-20 DIAGNOSIS — D2261 Melanocytic nevi of right upper limb, including shoulder: Secondary | ICD-10-CM | POA: Diagnosis not present

## 2023-05-22 DIAGNOSIS — J9610 Chronic respiratory failure, unspecified whether with hypoxia or hypercapnia: Secondary | ICD-10-CM | POA: Diagnosis not present

## 2023-05-22 DIAGNOSIS — G4733 Obstructive sleep apnea (adult) (pediatric): Secondary | ICD-10-CM | POA: Diagnosis not present

## 2023-05-27 ENCOUNTER — Ambulatory Visit: Payer: Medicare PPO | Admitting: Cardiology

## 2023-05-27 ENCOUNTER — Telehealth: Payer: Self-pay | Admitting: Family Medicine

## 2023-05-27 ENCOUNTER — Encounter: Payer: Self-pay | Admitting: Family Medicine

## 2023-05-27 ENCOUNTER — Ambulatory Visit (INDEPENDENT_AMBULATORY_CARE_PROVIDER_SITE_OTHER): Payer: Medicare PPO | Admitting: Family Medicine

## 2023-05-27 VITALS — BP 122/76 | HR 83 | Temp 98.3°F | Ht 65.0 in | Wt 198.8 lb

## 2023-05-27 DIAGNOSIS — Z23 Encounter for immunization: Secondary | ICD-10-CM | POA: Diagnosis not present

## 2023-05-27 DIAGNOSIS — Z0000 Encounter for general adult medical examination without abnormal findings: Secondary | ICD-10-CM

## 2023-05-27 DIAGNOSIS — E1121 Type 2 diabetes mellitus with diabetic nephropathy: Secondary | ICD-10-CM

## 2023-05-27 LAB — POCT GLYCOSYLATED HEMOGLOBIN (HGB A1C): Hemoglobin A1C: 7.1 % — AB (ref 4.0–5.6)

## 2023-05-27 LAB — MICROALBUMIN / CREATININE URINE RATIO
Creatinine,U: 51.2 mg/dL
Microalb Creat Ratio: 1.4 mg/g (ref 0.0–30.0)
Microalb, Ur: <0.7 mg/dL (ref 0.0–1.9)

## 2023-05-27 NOTE — Assessment & Plan Note (Signed)
Physical exam completed.  Encouraged healthy diet.  Discussed reducing sugary foods and drinks.  Discussed reducing salt intake.  Encouraged her to stay as active as she is able to given her chronic medical issues.  Mammogram and colonoscopy are up-to-date.  She was given flu vaccine today.  She will get her updated COVID-vaccine at the pharmacy.  DEXA scan is up-to-date.  A1c completed today.  Other lab work is up-to-date.

## 2023-05-27 NOTE — Telephone Encounter (Signed)
Can you call [employer] and request a compliance report for this patient CPAP?  Thanks.

## 2023-05-27 NOTE — Telephone Encounter (Signed)
[employer] states they are faxing the CPAP compliance report over.

## 2023-05-27 NOTE — Progress Notes (Signed)
Marikay Alar, MD Phone: [phone]  Betty [name] is a 77 y.o. female who presents today for CPE.  Diet: Too much salt, too many sweets, 3 cans of soda a day, does get fruits and vegetables most days, husband reports that he about 2 meals a day out Exercise: None given chronic respiratory issues Colonoscopy: 03/22/2020 with no recall given age Mammogram: 03/15/2023 negative Family history-  Colon cancer: no  Breast cancer: no  Ovarian cancer: no Menses: postmenopausal Vaccines-   Flu: due  Tetanus: UTD  Shingles: UTD  COVID19: due  Pneumonia: UTD  RSV: UTD Hep C Screening: UTD Tobacco use: no Alcohol use: no Illicit Drug use: no Dentist: yes Ophthalmology: yes Bilateral leg edema has improved with Lasix.  Notes she has been off Lasix recently while she was being treated for tooth abscess that she is restarted Lasix since finishing treatment.   Active Ambulatory Problems    Diagnosis Date Noted   HYPERLIPIDEMIA, MIXED 05/02/2007   GOUT 12/26/2009   ALLERGIC RHINITIS, SEASONAL 10/08/2007   Chronic hypoxemic respiratory failure (HCC) 11/11/2012   Obstructive sleep apnea 11/11/2012   Diabetes mellitus type 2, controlled (HCC) 01/07/2013   Edema 01/07/2013   Obesity (BMI 30-39.9) 04/09/2013   Chronic diastolic heart failure (HCC) 04/08/2014   Benign neoplasm of ascending colon    Benign neoplasm of transverse colon    Routine general medical examination at a health care facility 01/27/2016   Left-sided low back pain with left-sided sciatica 05/01/2016   Restrictive lung disease 08/06/2016   Pulmonary HTN (HCC) 10/27/2019   Benign neoplasm of sigmoid colon    Venous insufficiency 05/17/2021   Elevated TSH 11/14/2021   Abdominal discomfort in right upper quadrant 11/14/2021   Onychomycosis 11/19/2022   Resolved Ambulatory Problems    Diagnosis Date Noted   CARDIOMYOPATHY 10/08/2007   HYPERGLYCEMIA 09/08/2008   CHOLECYSTECTOMY, HX OF 10/08/2007   Leg pain,  lateral 04/12/2011   Myalgia 05/03/2011   Meralgia paresthetica of left side 08/20/2011   Right shoulder pain 02/25/2012   Right ear pain 05/01/2012   DOE (dyspnea on exertion) 08/18/2012   Fatigue 08/18/2012   Diaphragm dysfunction 10/21/2012   Medicare annual wellness visit, subsequent 11/28/2012   Left Achilles tendinitis 04/23/2014   Middle ear effusion 07/19/2014   Rash and nonspecific skin eruption 08/30/2014   Postmenopausal estrogen deficiency 08/30/2014   History of colonic polyps    Need for hepatitis C screening test 04/21/2015   Excessive cerumen in right ear canal 05/01/2016   Flatulence 04/10/2019   Cervical radiculopathy 10/14/2019   Elevated LDL cholesterol level 10/27/2019   Fall 04/20/2020   Candidal intertrigo 08/15/2020   Vaginal discharge 08/15/2020   Ear itching 11/14/2020   COVID-19 06/14/2021   Otitis externa 11/14/2021   Past Medical History:  Diagnosis Date   Allergic rhinitis    Arthritis    Cataracts, bilateral    Chronic diastolic CHF (congestive heart failure) (HCC)    Chronic respiratory failure (HCC)    Diabetes mellitus without complication (HCC)    Diverticulosis    Glucose intolerance (impaired glucose tolerance)    Gout    History of chicken pox    Hyperlipidemia    Hypertension    NICM (nonischemic cardiomyopathy) (HCC) 2002   Obesity    Oxygen deficiency 2017   PONV (postoperative nausea and vomiting)    Skin cancer    Sleep apnea     Family History  Problem Relation Age of Onset   Lymphoma  Mother 66   COPD Father        was a smoker   Stroke Father    Pneumonia Father    Diabetes Father    Hyperlipidemia Father    Heart disease Father    Heart attack Father    Hypertension Father    Diabetes Sister    Depression Sister    Liver disease Sister    Meniere's disease Brother    Hypertension Brother    Schizophrenia Daughter    Bladder Cancer Maternal Grandmother    Leukemia Maternal Grandfather    Diabetes Paternal  Grandfather    Heart disease Paternal Grandfather    Colon cancer Neg Hx    Stomach cancer Neg Hx    Breast cancer Neg Hx    Colon polyps Neg Hx    Esophageal cancer Neg Hx    Rectal cancer Neg Hx     Social History   Socioeconomic History   Marital status: Married    Spouse name: Not on file   Number of children: 2   Years of education: Not on file   Highest education level: Bachelor's degree (e.g., BA, AB, BS)  Occupational History   Occupation: Retired Magazine features editor: retired   Occupation: Works at [employer]  Tobacco Use   Smoking status: Never   Smokeless tobacco: Never  Vaping Use   Vaping status: Never Used  Substance and Sexual Activity   Alcohol use: No    Alcohol/week: 0.0 standard drinks of alcohol   Drug use: No   Sexual activity: Not Currently    Birth control/protection: Post-menopausal  Other Topics Concern   Not on file  Social History Narrative   Lives in [place] with husband. Worked at Runner, broadcasting/film/video at [employer]. 2 children. No pets.   Social Determinants of Health   Financial Resource Strain: Low Risk  (04/19/2023)   Overall Financial Resource Strain (CARDIA)    Difficulty of Paying Living Expenses: Not hard at all  Food Insecurity: No Food Insecurity (04/19/2023)   Hunger Vital Sign    Worried About Running Out of Food in the Last Year: Never true    Ran Out of Food in the Last Year: Never true  Transportation Needs: No Transportation Needs (04/19/2023)   PRAPARE - Administrator, Civil Service (Medical): No    Lack of Transportation (Non-Medical): No  Physical Activity: Inactive (04/19/2023)   Exercise Vital Sign    Days of Exercise per Week: 0 days    Minutes of Exercise per Session: 0 min  Stress: No Stress Concern Present (04/19/2023)   [employer] of Occupational Health - Occupational Stress Questionnaire    Feeling of Stress : Only a little  Social Connections: Socially Integrated (04/19/2023)   Social  Connection and Isolation Panel [NHANES]    Frequency of Communication with Friends and Family: More than three times a week    Frequency of Social Gatherings with Friends and Family: More than three times a week    Attends Religious Services: More than 4 times per year    Active Member of [employer] or Organizations: Yes    Attends Engineer, structural: More than 4 times per year    Marital Status: Married  Catering manager Violence: Not At Risk (04/19/2023)   Humiliation, Afraid, Rape, and Kick questionnaire    Fear of Current or Ex-Partner: No    Emotionally Abused: No    Physically Abused: No    Sexually  Abused: No    ROS  General:  Negative for nexplained weight loss, fever Skin: Negative for new or changing mole, sore that won't heal HEENT: Negative for trouble hearing, trouble seeing, ringing in ears, mouth sores, hoarseness, change in voice, dysphagia. CV: Positive for dyspnea (chronic), edema, negative for chest pain, palpitations Resp: Negative for cough, dyspnea, hemoptysis GI: Negative for nausea, vomiting, diarrhea, constipation, abdominal pain, melena, hematochezia. GU: Negative for dysuria, incontinence, urinary hesitance, hematuria, vaginal or penile discharge, polyuria, sexual difficulty, lumps in testicle or breasts MSK: Negative for muscle cramps or aches, joint pain or swelling Neuro: Negative for headaches, weakness, numbness, dizziness, passing out/fainting Psych: Negative for depression, anxiety, memory problems  Objective  Physical Exam Vitals:   05/27/23 1048  BP: 122/76  Pulse: 83  Temp: 98.3 F (36.8 C)  SpO2: 99%    BP Readings from Last 3 Encounters:  05/27/23 122/76  05/14/23 (!) 154/89  03/29/23 118/76   Wt Readings from Last 3 Encounters:  05/27/23 198 lb 12.8 oz (90.2 kg)  04/19/23 196 lb (88.9 kg)  03/29/23 202 lb 2 oz (91.7 kg)    Physical Exam Constitutional:      General: She is not in acute distress.    Appearance: She is  not diaphoretic.  HENT:     Head: Normocephalic and atraumatic.  Cardiovascular:     Rate and Rhythm: Normal rate and regular rhythm.     Heart sounds: Normal heart sounds.  Pulmonary:     Effort: Pulmonary effort is normal.     Breath sounds: Normal breath sounds.  Abdominal:     General: Bowel sounds are normal. There is no distension.     Palpations: Abdomen is soft.     Tenderness: There is no abdominal tenderness.  Musculoskeletal:     Comments: 1+ pitting edema left lower extremity, trace pitting edema right lower extremity  Skin:    General: Skin is warm and dry.  Neurological:     Mental Status: She is alert.  Psychiatric:        Mood and Affect: Mood normal.      Assessment/Plan:   Routine general medical examination at a health care facility Assessment & Plan: Physical exam completed.  Encouraged healthy diet.  Discussed reducing sugary foods and drinks.  Discussed reducing salt intake.  Encouraged her to stay as active as she is able to given her chronic medical issues.  Mammogram and colonoscopy are up-to-date.  She was given flu vaccine today.  She will get her updated COVID-vaccine at the pharmacy.  DEXA scan is up-to-date.  A1c completed today.  Other lab work is up-to-date.   Controlled type 2 diabetes mellitus with diabetic nephropathy, without long-term current use of insulin (HCC) -     Microalbumin / creatinine urine ratio -     POCT glycosylated hemoglobin (Hb A1C)  Encounter for administration of vaccine -     Flu Vaccine Trivalent High Dose (Fluad)    Return in about 6 months (around 11/24/2023) for DM.   Marikay Alar, MD [facility]

## 2023-05-29 ENCOUNTER — Ambulatory Visit: Payer: Medicare PPO | Attending: Cardiology | Admitting: Cardiology

## 2023-05-29 ENCOUNTER — Encounter: Payer: Self-pay | Admitting: Cardiology

## 2023-05-29 VITALS — BP 114/64 | HR 100 | Ht 65.0 in | Wt 197.8 lb

## 2023-05-29 DIAGNOSIS — R0609 Other forms of dyspnea: Secondary | ICD-10-CM

## 2023-05-29 DIAGNOSIS — R600 Localized edema: Secondary | ICD-10-CM | POA: Diagnosis not present

## 2023-05-29 DIAGNOSIS — I428 Other cardiomyopathies: Secondary | ICD-10-CM | POA: Diagnosis not present

## 2023-05-29 DIAGNOSIS — I10 Essential (primary) hypertension: Secondary | ICD-10-CM | POA: Diagnosis not present

## 2023-05-29 NOTE — Patient Instructions (Signed)
Medication Instructions:   Your physician recommends that you continue on your current medications as directed. Please refer to the Current Medication list given to you today.  *If you need a refill on your cardiac medications before your next appointment, please call your pharmacy*   Lab Work:  None Ordered  If you have labs (blood work) drawn today and your tests are completely normal, you will receive your results only by: MyChart Message (if you have MyChart) OR A paper copy in the mail If you have any lab test that is abnormal or we need to change your treatment, we will call you to review the results.   Testing/Procedures:  None Ordered    Follow-Up: At [facility], you and your health needs are our priority.  As part of our continuing mission to provide you with exceptional heart care, we have created designated Provider Care Teams.  These Care Teams include your primary Cardiologist (physician) and Advanced Practice Providers (APPs -  Physician Assistants and Nurse Practitioners) who all work together to provide you with the care you need, when you need it.  We recommend signing up for the patient portal called "MyChart".  Sign up information is provided on this After Visit Summary.  MyChart is used to connect with patients for Virtual Visits (Telemedicine).  Patients are able to view lab/test results, encounter notes, upcoming appointments, etc.  Non-urgent messages can be sent to your provider as well.   To learn more about what you can do with MyChart, go to [email or web address].    Your next appointment:   5 month(s)  Provider:   You may see Debbe Odea, MD or one of the following Advanced Practice Providers on your designated Care Team:   Nicolasa Ducking, NP Eula Listen, PA-C Cadence Fransico Michael, PA-C Charlsie Quest, NP

## 2023-05-29 NOTE — Progress Notes (Signed)
Cardiology Office Note:    Date:  05/29/2023   ID:  Betty [name], [place] March 13, 1946, MRN [id number]  PCP:  Glori Luis, MD   [facility] Providers Cardiologist:  Debbe Odea, MD Sleep Medicine:  Armanda Magic, MD     Referring MD: Glori Luis, MD   Chief Complaint  Patient presents with   Follow-up    Discuss cardiac testing results.  Patient denies new or acute cardiac problems/concerns today.  Pulse O2 reading of 83% but is wearing fingernail polish that could alter reading    History of Present Illness:    Betty [name] is a 77 y.o. female with a hx of NICM (initial EF 25%--->normalized 55-60%), HFpEF, LHC 2013 nl coronary arteries, OSA on CPAP, ILD, chronic hypoxemic respiratory failure on O2 nightly who presents for follow-up.    Previously started on Lasix 20 mg daily due to leg edema.  Edema did improve initially, but Lasix therapy was interrupted due to a sinus infection.  Recently restarted Lasix about 3 days ago.  Endorses adequate urination with Lasix intake.  No new concerns or complaints today.  Repeat echocardiogram was ordered showing EF 55 to 60% which was unchanged from prior.  Prior notes/studies Echo 9/24 EF 55 to 60% Lower extremity ultrasound venous 9/24 no DVT. Echo 2019 EF 55 to 60% Left heart cath 2013 normal coronary arteries  Past Medical History:  Diagnosis Date   Allergic rhinitis    never tested, fall and spring   Arthritis    hands,knees, feet   Cataracts, bilateral    not surgical yet   CHOLECYSTECTOMY, HX OF 10/08/2007   Qualifier: Diagnosis of  By: Hetty Ely MD, Franne Grip    Chronic diastolic CHF (congestive heart failure) (HCC)    Chronic respiratory failure (HCC)    Diabetes mellitus without complication (HCC)    Diverticulosis    problems with frequent gas, burping   Glucose intolerance (impaired glucose tolerance)    Gout    History of chicken pox    Hyperlipidemia    Hypertension    NICM  (nonischemic cardiomyopathy) (HCC) 2002   EF 25%; improved to normal - echo 4/08: EF 50-55%, mild MR, mild LAE, mild TR;    cath 3/03: normal cors, EF 40%   Obesity    Oxygen deficiency 2017   at night   PONV (postoperative nausea and vomiting)    after wisdom tooth extraction   Restrictive lung disease    Skin cancer    skin cancers only   Sleep apnea    cpap settings 4    Past Surgical History:  Procedure Laterality Date   BILATERAL VATS ABLATION Right    biopsy done 2015- [facility]   BREAST EXCISIONAL BIOPSY Left 80's   NEG   CARDIAC CATHETERIZATION     CATARACT EXTRACTION W/PHACO Right 06/21/2020   Procedure: CATARACT EXTRACTION PHACO AND INTRAOCULAR LENS PLACEMENT (IOC) RIGHT DIABETIC;  Surgeon: Galen Manila, MD;  Location: [facility];  Service: Ophthalmology;  Laterality: Right;  4.06 0:32.8   CATARACT EXTRACTION W/PHACO Left 07/12/2020   Procedure: CATARACT EXTRACTION PHACO AND INTRAOCULAR LENS PLACEMENT (IOC) LEFT DIABETIC 6.96 00:46.3;  Surgeon: Galen Manila, MD;  Location: [facility];  Service: Ophthalmology;  Laterality: Left;  Diabetic - oral meds   CHOLECYSTECTOMY     choleystectomy  02/2002   COLONOSCOPY WITH PROPOFOL N/A 12/20/2014   Procedure: COLONOSCOPY WITH PROPOFOL;  Surgeon: Beverley Fiedler, MD;  Location: [facility];  Service: Gastroenterology;  Laterality: N/A;   COLONOSCOPY WITH PROPOFOL N/A 03/22/2020   Procedure: COLONOSCOPY WITH PROPOFOL;  Surgeon: Beverley Fiedler, MD;  Location: [facility];  Service: Gastroenterology;  Laterality: N/A;   CYSTECTOMY  1996   l breast   DILATION AND CURETTAGE OF UTERUS  2011   Dr. Marice Potter at [facility]   INDUCED ABORTION     nsvd     x 2   POLYPECTOMY  03/22/2020   Procedure: POLYPECTOMY;  Surgeon: Beverley Fiedler, MD;  Location: [facility];  Service: Gastroenterology;;   TUBAL LIGATION  1975   VAGINAL DELIVERY     x2    Current Medications: Current Meds  Medication Sig   aspirin 81 MG EC  tablet Take 1 tablet (81 mg total) by mouth daily.   CALCIUM-MAGNESIUM-VITAMIN D PO Take 1 tablet by mouth daily.   carvedilol (COREG) 12.5 MG tablet TAKE 1 TABLET (12.5MG  TOTAL) BY MOUTH TWICE A DAY WITH MEALS   Coenzyme Q10 (CO Q 10 PO) Take 200 mg by mouth at bedtime.    Colchicine (MITIGARE) 0.6 MG CAPS Day 1: take 1.2 mg (2 tablets) by mouth at the first sign of flare, followed by 0.6 mg (1 tablet) by mouth after 1 hour. Day 2 and thereafter: take 0.6 mg (1 tablet) by mouth daily until flare has resolved. (Patient taking differently: daily as needed. Day 1: take 1.2 mg (2 tablets) by mouth at the first sign of flare, followed by 0.6 mg (1 tablet) by mouth after 1 hour. Day 2 and thereafter: take 0.6 mg (1 tablet) by mouth daily until flare has resolved.)   Dulaglutide (TRULICITY) 0.75 MG/0.5ML SOPN INJECT 0.75 MG SUBCUTANEOUSLY ONE TIME PER WEEK   estradiol (ESTRACE) 0.1 MG/GM vaginal cream Place 0.25 Applicatorfuls vaginally 3 (three) times a week.   ezetimibe (ZETIA) 10 MG tablet TAKE 1 TABLET BY MOUTH EVERY DAY   Fish Oil OIL Take 1,200 mg by mouth 2 (two) times daily. GUMMIES   fluticasone (FLONASE) 50 MCG/ACT nasal spray SPRAY 2 SPRAYS INTO EACH NOSTRIL EVERY DAY   furosemide (LASIX) 20 MG tablet Take 1 tablet (20 mg total) by mouth daily.   losartan (COZAAR) 50 MG tablet TAKE 1/2 TABLET BY MOUTH EVERY DAY   multivitamin (THERAGRAN) per tablet Take 1 tablet by mouth daily.   OXYGEN Inhale 3 L into the lungs at bedtime.   polyvinyl alcohol (LIQUIFILM TEARS) 1.4 % ophthalmic solution Place 1 drop into both eyes every morning.   pravastatin (PRAVACHOL) 40 MG tablet Take 1 tablet (40 mg total) by mouth daily.     Allergies:   Amoxicillin, Etodolac, Hydrochlorothiazide, Meloxicam, Sulfa antibiotics, Allopurinol, Metformin and related, Rosuvastatin, Sulfonamide derivatives, Jardiance [empagliflozin], and Lasix [furosemide]   Social History   Socioeconomic History   Marital status: Married     Spouse name: Not on file   Number of children: 2   Years of education: Not on file   Highest education level: Bachelor's degree (e.g., BA, AB, BS)  Occupational History   Occupation: Retired Magazine features editor: retired   Occupation: Works at [employer]  Tobacco Use   Smoking status: Never   Smokeless tobacco: Never  Vaping Use   Vaping status: Never Used  Substance and Sexual Activity   Alcohol use: No    Alcohol/week: 0.0 standard drinks of alcohol   Drug use: No   Sexual activity: Not Currently    Birth control/protection: Post-menopausal  Other Topics Concern  Not on file  Social History Narrative   Lives in [place] with husband. Worked at Runner, broadcasting/film/video at [employer]. 2 children. No pets.   Social Determinants of Health   Financial Resource Strain: Low Risk  (04/19/2023)   Overall Financial Resource Strain (CARDIA)    Difficulty of Paying Living Expenses: Not hard at all  Food Insecurity: No Food Insecurity (04/19/2023)   Hunger Vital Sign    Worried About Running Out of Food in the Last Year: Never true    Ran Out of Food in the Last Year: Never true  Transportation Needs: No Transportation Needs (04/19/2023)   PRAPARE - Administrator, Civil Service (Medical): No    Lack of Transportation (Non-Medical): No  Physical Activity: Inactive (04/19/2023)   Exercise Vital Sign    Days of Exercise per Week: 0 days    Minutes of Exercise per Session: 0 min  Stress: No Stress Concern Present (04/19/2023)   [employer] of Occupational Health - Occupational Stress Questionnaire    Feeling of Stress : Only a little  Social Connections: Socially Integrated (04/19/2023)   Social Connection and Isolation Panel [NHANES]    Frequency of Communication with Friends and Family: More than three times a week    Frequency of Social Gatherings with Friends and Family: More than three times a week    Attends Religious Services: More than 4 times per year    Active  Member of [employer] or Organizations: Yes    Attends Engineer, structural: More than 4 times per year    Marital Status: Married     Family History: The patient's family history includes Bladder Cancer in her maternal grandmother; COPD in her father; Depression in her sister; Diabetes in her father, paternal grandfather, and sister; Heart attack in her father; Heart disease in her father and paternal grandfather; Hyperlipidemia in her father; Hypertension in her brother and father; Leukemia in her maternal grandfather; Liver disease in her sister; Lymphoma (age of onset: 43) in her mother; Meniere's disease in her brother; Pneumonia in her father; Schizophrenia in her daughter; Stroke in her father. There is no history of Colon cancer, Stomach cancer, Breast cancer, Colon polyps, Esophageal cancer, or Rectal cancer.  ROS:   Please see the history of present illness.     All other systems reviewed and are negative.  EKGs/Labs/Other Studies Reviewed:    The following studies were reviewed today:     Recent Labs: 11/19/2022: ALT 23 04/09/2023: BUN 16; Creatinine, Ser 0.57; Potassium 4.1; Sodium 143  Recent Lipid Panel    Component Value Date/Time   CHOL 162 11/19/2022 1049   TRIG 238.0 (H) 11/19/2022 1049   HDL 67.30 11/19/2022 1049   CHOLHDL 2 11/19/2022 1049   VLDL 47.6 (H) 11/19/2022 1049   LDLCALC 58 04/30/2017 1048   LDLDIRECT 58.0 11/19/2022 1049     Risk Assessment/Calculations:             Physical Exam:    VS:  BP 114/64 (BP Location: Left Arm, Patient Position: Sitting, Cuff Size: Large)   Pulse 100   Ht 5\' 5"  (1.651 m)   Wt 197 lb 12.8 oz (89.7 kg)   SpO2 (!) 83% Comment: wearing fingernail polish.  BMI 32.92 kg/m     Wt Readings from Last 3 Encounters:  05/29/23 197 lb 12.8 oz (89.7 kg)  05/27/23 198 lb 12.8 oz (90.2 kg)  04/19/23 196 lb (88.9 kg)     GEN:  Well  nourished, well developed in no acute distress HEENT: Normal NECK: No JVD; No carotid  bruits CARDIAC: RRR, no murmurs, rubs, gallops RESPIRATORY:  Clear to auscultation without rales, wheezing or rhonchi  ABDOMEN: Soft, non-tender, non-distended MUSCULOSKELETAL:  1-2+ edema; No deformity  SKIN: Warm and dry NEUROLOGIC:  Alert and oriented x 3 PSYCHIATRIC:  Normal affect   ASSESSMENT:    1. NICM (nonischemic cardiomyopathy) (HCC)   2. Dyspnea on exertion   3. Primary hypertension   4. Bilateral leg edema    PLAN:    In order of problems listed above:  Nonischemic cardiomyopathy, last EF 2019 55 to 60%.  Echo 9/24 EF 55 to 60%.  Continue Lasix 20 mg daily. Continue Coreg 12.5 mg twice daily, losartan 25 mg daily. Dyspnea on exertion, history of ILD, chronic respiratory failure.  Continue Lasix as above.  ILD management as per pulmonary medicine. Hypertension, BP controlled.  Continue Coreg, losartan. Bilateral leg edema, left greater than right.  Advised to take Lasix as prescribed.  Continue Lasix 20 mg daily.  No DVT on lower extremity ultrasound.    Follow-up in 5 months.  Consider titration of Lasix if edema is persistent.  Medication Adjustments/Labs and Tests Ordered: Current medicines are reviewed at length with the patient today.  Concerns regarding medicines are outlined above.  No orders of the defined types were placed in this encounter.  No orders of the defined types were placed in this encounter.   Patient Instructions  Medication Instructions:   Your physician recommends that you continue on your current medications as directed. Please refer to the Current Medication list given to you today.  *If you need a refill on your cardiac medications before your next appointment, please call your pharmacy*   Lab Work:  None Ordered  If you have labs (blood work) drawn today and your tests are completely normal, you will receive your results only by: MyChart Message (if you have MyChart) OR A paper copy in the mail If you have any lab test that is  abnormal or we need to change your treatment, we will call you to review the results.   Testing/Procedures:  None Ordered   Follow-Up: At [facility], you and your health needs are our priority.  As part of our continuing mission to provide you with exceptional heart care, we have created designated Provider Care Teams.  These Care Teams include your primary Cardiologist (physician) and Advanced Practice Providers (APPs -  Physician Assistants and Nurse Practitioners) who all work together to provide you with the care you need, when you need it.  We recommend signing up for the patient portal called "MyChart".  Sign up information is provided on this After Visit Summary.  MyChart is used to connect with patients for Virtual Visits (Telemedicine).  Patients are able to view lab/test results, encounter notes, upcoming appointments, etc.  Non-urgent messages can be sent to your provider as well.   To learn more about what you can do with MyChart, go to [email or web address].    Your next appointment:   5 month(s)  Provider:   You may see Debbe Odea, MD or one of the following Advanced Practice Providers on your designated Care Team:   Nicolasa Ducking, NP Eula Listen, PA-C Cadence Fransico Michael, PA-C Charlsie Quest, NP   Signed, Debbe Odea, MD  05/29/2023 12:12 PM    [facility]

## 2023-07-04 ENCOUNTER — Ambulatory Visit: Payer: Medicare PPO | Admitting: Cardiology

## 2023-07-11 ENCOUNTER — Other Ambulatory Visit: Payer: Self-pay

## 2023-07-11 MED ORDER — CARVEDILOL 12.5 MG PO TABS: ORAL_TABLET | ORAL | 1 refills | Status: DC

## 2023-08-08 ENCOUNTER — Other Ambulatory Visit: Payer: Self-pay | Admitting: Family Medicine

## 2023-08-08 DIAGNOSIS — E782 Mixed hyperlipidemia: Secondary | ICD-10-CM

## 2023-08-14 DIAGNOSIS — J9610 Chronic respiratory failure, unspecified whether with hypoxia or hypercapnia: Secondary | ICD-10-CM | POA: Diagnosis not present

## 2023-08-14 DIAGNOSIS — G4733 Obstructive sleep apnea (adult) (pediatric): Secondary | ICD-10-CM | POA: Diagnosis not present

## 2023-10-01 ENCOUNTER — Other Ambulatory Visit: Payer: Self-pay | Admitting: Family Medicine

## 2023-10-01 DIAGNOSIS — E782 Mixed hyperlipidemia: Secondary | ICD-10-CM

## 2023-10-28 ENCOUNTER — Encounter: Payer: Self-pay | Admitting: Cardiology

## 2023-10-28 ENCOUNTER — Ambulatory Visit: Payer: Medicare PPO | Attending: Cardiology | Admitting: Cardiology

## 2023-10-28 ENCOUNTER — Ambulatory Visit: Payer: Medicare PPO | Admitting: Cardiology

## 2023-10-28 VITALS — Ht 65.0 in | Wt 197.2 lb

## 2023-10-28 DIAGNOSIS — R0609 Other forms of dyspnea: Secondary | ICD-10-CM | POA: Diagnosis not present

## 2023-10-28 DIAGNOSIS — R600 Localized edema: Secondary | ICD-10-CM

## 2023-10-28 DIAGNOSIS — I428 Other cardiomyopathies: Secondary | ICD-10-CM

## 2023-10-28 DIAGNOSIS — I10 Essential (primary) hypertension: Secondary | ICD-10-CM | POA: Diagnosis not present

## 2023-10-28 NOTE — Progress Notes (Signed)
 Cardiology Office Note:    Date:  10/28/2023   ID:  [name], Betty 1945-08-28, MRN [id number]  PCP:  Bethanie Dicker, NP   [facility] Providers Cardiologist:  Debbe Odea, MD Sleep Medicine:  Armanda Magic, MD     Referring MD: Glori Luis, MD   Chief Complaint  Patient presents with   Follow-up    5 month follow up visit. Patient states that she experiences shortness of breath always and swelling . Meds reviewed.     History of Present Illness:    Betty [name] is a 78 y.o. female with a hx of NICM (initial EF 25%--->normalized 55-60%), HFpEF, LHC 2013 nl coronary arteries, OSA on CPAP, ILD, chronic hypoxemic respiratory failure on O2 nightly who presents for follow-up.    Doing okay, edema improved since taking Lasix 20 mg daily.  Left leg edema greater than right.  Complaints/worried of dry eyes with over diuresing.  BP adequately controlled.  Feels well, has no concerns today.   Prior notes/studies Echo 9/24 EF 55 to 60% Lower extremity ultrasound venous 9/24 no DVT. Echo 2019 EF 55 to 60% Left heart cath 2013 normal coronary arteries  Past Medical History:  Diagnosis Date   Allergic rhinitis    never tested, fall and spring   Arthritis    hands,knees, feet   Cataracts, bilateral    not surgical yet   CHOLECYSTECTOMY, HX OF 10/08/2007   Qualifier: Diagnosis of  By: Hetty Ely MD, Franne Grip    Chronic diastolic CHF (congestive heart failure) (HCC)    Chronic respiratory failure (HCC)    Diabetes mellitus without complication (HCC)    Diverticulosis    problems with frequent gas, burping   Glucose intolerance (impaired glucose tolerance)    Gout    History of chicken pox    Hyperlipidemia    Hypertension    NICM (nonischemic cardiomyopathy) (HCC) 2002   EF 25%; improved to normal - echo 4/08: EF 50-55%, mild MR, mild LAE, mild TR;    cath 3/03: normal cors, EF 40%   Obesity    Oxygen deficiency 2017   at night   PONV  (postoperative nausea and vomiting)    after wisdom tooth extraction   Restrictive lung disease    Skin cancer    skin cancers only   Sleep apnea    cpap settings 4    Past Surgical History:  Procedure Laterality Date   BILATERAL VATS ABLATION Right    biopsy done 2015- [facility]   BREAST EXCISIONAL BIOPSY Left 80's   NEG   CARDIAC CATHETERIZATION     CATARACT EXTRACTION W/PHACO Right 06/21/2020   Procedure: CATARACT EXTRACTION PHACO AND INTRAOCULAR LENS PLACEMENT (IOC) RIGHT DIABETIC;  Surgeon: Galen Manila, MD;  Location: [facility];  Service: Ophthalmology;  Laterality: Right;  4.06 0:32.8   CATARACT EXTRACTION W/PHACO Left 07/12/2020   Procedure: CATARACT EXTRACTION PHACO AND INTRAOCULAR LENS PLACEMENT (IOC) LEFT DIABETIC 6.96 00:46.3;  Surgeon: Galen Manila, MD;  Location: [facility];  Service: Ophthalmology;  Laterality: Left;  Diabetic - oral meds   CHOLECYSTECTOMY     choleystectomy  02/2002   COLONOSCOPY WITH PROPOFOL N/A 12/20/2014   Procedure: COLONOSCOPY WITH PROPOFOL;  Surgeon: Beverley Fiedler, MD;  Location: [facility];  Service: Gastroenterology;  Laterality: N/A;   COLONOSCOPY WITH PROPOFOL N/A 03/22/2020   Procedure: COLONOSCOPY WITH PROPOFOL;  Surgeon: Beverley Fiedler, MD;  Location: [facility];  Service: Gastroenterology;  Laterality: N/A;  CYSTECTOMY  1996   l breast   DILATION AND CURETTAGE OF UTERUS  2011   [facility] Marice Potter at [facility]   INDUCED ABORTION     nsvd     x 2   POLYPECTOMY  03/22/2020   Procedure: POLYPECTOMY;  Surgeon: Beverley Fiedler, MD;  Location: [facility];  Service: Gastroenterology;;   TUBAL LIGATION  1975   VAGINAL DELIVERY     x2    Current Medications: Current Meds  Medication Sig   aspirin 81 MG EC tablet Take 1 tablet (81 mg total) by mouth daily.   CALCIUM-MAGNESIUM-VITAMIN D PO Take 1 tablet by mouth daily.   carvedilol (COREG) 12.5 MG tablet TAKE 1 TABLET (12.5MG  TOTAL) BY MOUTH TWICE A DAY WITH MEALS    Coenzyme Q10 (CO Q 10 PO) Take 200 mg by mouth at bedtime.    Colchicine (MITIGARE) 0.6 MG CAPS Day 1: take 1.2 mg (2 tablets) by mouth at the first sign of flare, followed by 0.6 mg (1 tablet) by mouth after 1 hour. Day 2 and thereafter: take 0.6 mg (1 tablet) by mouth daily until flare has resolved. (Patient taking differently: daily as needed. Day 1: take 1.2 mg (2 tablets) by mouth at the first sign of flare, followed by 0.6 mg (1 tablet) by mouth after 1 hour. Day 2 and thereafter: take 0.6 mg (1 tablet) by mouth daily until flare has resolved.)   Dulaglutide (TRULICITY) 0.75 MG/0.5ML SOAJ INJECT 0.75 MG SUBCUTANEOUSLY ONE TIME PER WEEK   estradiol (ESTRACE) 0.1 MG/GM vaginal cream Place 0.25 Applicatorfuls vaginally 3 (three) times a week.   ezetimibe (ZETIA) 10 MG tablet TAKE 1 TABLET BY MOUTH EVERY DAY   Fish Oil OIL Take 1,200 mg by mouth 2 (two) times daily. GUMMIES   fluticasone (FLONASE) 50 MCG/ACT nasal spray SPRAY 2 SPRAYS INTO EACH NOSTRIL EVERY DAY   furosemide (LASIX) 20 MG tablet Take 1 tablet (20 mg total) by mouth daily.   losartan (COZAAR) 50 MG tablet TAKE 1/2 TABLET BY MOUTH EVERY DAY   multivitamin (THERAGRAN) per tablet Take 1 tablet by mouth daily.   OXYGEN Inhale 3 L into the lungs at bedtime.   polyvinyl alcohol (LIQUIFILM TEARS) 1.4 % ophthalmic solution Place 1 drop into both eyes every morning.   pravastatin (PRAVACHOL) 40 MG tablet TAKE 1 TABLET BY MOUTH EVERY DAY     Allergies:   Amoxicillin, Etodolac, Hydrochlorothiazide, Meloxicam, Sulfa antibiotics, Allopurinol, Metformin and related, Rosuvastatin, Sulfonamide derivatives, Jardiance [empagliflozin], and Lasix [furosemide]   Social History   Socioeconomic History   Marital status: Married    Spouse name: Not on file   Number of children: 2   Years of education: Not on file   Highest education level: Bachelor's degree (e.g., BA, AB, BS)  Occupational History   Occupation: Retired Magazine features editor:  retired   Occupation: Works at [employer]  Tobacco Use   Smoking status: Never   Smokeless tobacco: Never  Vaping Use   Vaping status: Never Used  Substance and Sexual Activity   Alcohol use: No    Alcohol/week: 0.0 standard drinks of alcohol   Drug use: No   Sexual activity: Not Currently    Birth control/protection: Post-menopausal  Other Topics Concern   Not on file  Social History Narrative   Lives in [place] with husband. Worked at Runner, broadcasting/film/video at [employer]. 2 children. No pets.   Social Drivers of Corporate investment banker Strain: Low Risk  (  04/19/2023)   Overall Financial Resource Strain (CARDIA)    Difficulty of Paying Living Expenses: Not hard at all  Food Insecurity: No Food Insecurity (04/19/2023)   Hunger Vital Sign    Worried About Running Out of Food in the Last Year: Never true    Ran Out of Food in the Last Year: Never true  Transportation Needs: No Transportation Needs (04/19/2023)   PRAPARE - Administrator, Civil Service (Medical): No    Lack of Transportation (Non-Medical): No  Physical Activity: Inactive (04/19/2023)   Exercise Vital Sign    Days of Exercise per Week: 0 days    Minutes of Exercise per Session: 0 min  Stress: No Stress Concern Present (04/19/2023)   [employer] of Occupational Health - Occupational Stress Questionnaire    Feeling of Stress : Only a little  Social Connections: Socially Integrated (04/19/2023)   Social Connection and Isolation Panel [NHANES]    Frequency of Communication with Friends and Family: More than three times a week    Frequency of Social Gatherings with Friends and Family: More than three times a week    Attends Religious Services: More than 4 times per year    Active Member of [employer] or Organizations: Yes    Attends Engineer, structural: More than 4 times per year    Marital Status: Married     Family History: The patient's family history includes Bladder Cancer in her maternal  grandmother; COPD in her father; Depression in her sister; Diabetes in her father, paternal grandfather, and sister; Heart attack in her father; Heart disease in her father and paternal grandfather; Hyperlipidemia in her father; Hypertension in her brother and father; Leukemia in her maternal grandfather; Liver disease in her sister; Lymphoma (age of onset: 1) in her mother; Meniere's disease in her brother; Pneumonia in her father; Schizophrenia in her daughter; Stroke in her father. There is no history of Colon cancer, Stomach cancer, Breast cancer, Colon polyps, Esophageal cancer, or Rectal cancer.  ROS:   Please see the history of present illness.     All other systems reviewed and are negative.  EKGs/Labs/Other Studies Reviewed:    The following studies were reviewed today:  EKG Interpretation Date/Time:  Monday October 28 2023 11:35:47 EST Ventricular Rate:  87 PR Interval:  174 QRS Duration:  102 QT Interval:  364 QTC Calculation: 438 R Axis:   -61  Text Interpretation: Sinus rhythm with occasional Premature ventricular complexes Left anterior fascicular block Moderate voltage criteria for LVH, may be normal variant ( R in aVL , Cornell product ) Confirmed by Debbe Odea ([id number]) on 10/28/2023 12:03:24 PM  Recent Labs: 11/19/2022: ALT 23 04/09/2023: BUN 16; Creatinine, Ser 0.57; Potassium 4.1; Sodium 143  Recent Lipid Panel    Component Value Date/Time   CHOL 162 11/19/2022 1049   TRIG 238.0 (H) 11/19/2022 1049   HDL 67.30 11/19/2022 1049   CHOLHDL 2 11/19/2022 1049   VLDL 47.6 (H) 11/19/2022 1049   LDLCALC 58 04/30/2017 1048   LDLDIRECT 58.0 11/19/2022 1049     Risk Assessment/Calculations:        Physical Exam:    VS:  Ht 5\' 5"  (1.651 m)   Wt 197 lb 3.2 oz (89.4 kg)   BMI 32.82 kg/m     Wt Readings from Last 3 Encounters:  10/28/23 197 lb 3.2 oz (89.4 kg)  05/29/23 197 lb 12.8 oz (89.7 kg)  05/27/23 198 lb 12.8 oz (90.2 kg)  GEN:  Well nourished, well  developed in no acute distress HEENT: Normal NECK: No JVD; No carotid bruits CARDIAC: RRR, no murmurs, rubs, gallops RESPIRATORY:  Clear to auscultation without rales, wheezing or rhonchi  ABDOMEN: Soft, non-tender, non-distended MUSCULOSKELETAL:  1-2+ edema; No deformity  SKIN: Warm and dry NEUROLOGIC:  Alert and oriented x 3 PSYCHIATRIC:  Normal affect   ASSESSMENT:    1. NICM (nonischemic cardiomyopathy) (HCC)   2. Dyspnea on exertion   3. Primary hypertension   4. Bilateral leg edema    PLAN:    In order of problems listed above:  Nonischemic cardiomyopathy, last EF 2019 55 to 60%.  Echo 9/24 EF 55 to 60%.  Leg edema improved but still present, continue Lasix 20 mg daily, okay to take an extra dose of Lasix for persistent edema. continue Coreg 12.5 mg twice daily, losartan 25 mg daily. Dyspnea on exertion, history of ILD, chronic respiratory failure.  Continue Lasix as above.  ILD management as per pulmonary medicine. Hypertension, BP controlled.  Continue Coreg 12.5 mg twice daily, losartan 25 mg daily. Bilateral leg edema, left greater than right overall improved on standing Lasix dose.  Continue Lasix 20 mg daily.    Follow-up in 6 months.    Medication Adjustments/Labs and Tests Ordered: Current medicines are reviewed at length with the patient today.  Concerns regarding medicines are outlined above.  Orders Placed This Encounter  Procedures   EKG 12-Lead   No orders of the defined types were placed in this encounter.   Patient Instructions  Medication Instructions:  No changes at this time.   *If you need a refill on your cardiac medications before your next appointment, please call your pharmacy*   Lab Work: None  If you have labs (blood work) drawn today and your tests are completely normal, you will receive your results only by: MyChart Message (if you have MyChart) OR A paper copy in the mail If you have any lab test that is abnormal or we need to  change your treatment, we will call you to review the results.   Testing/Procedures: None   Follow-Up: At [facility], you and your health needs are our priority.  As part of our continuing mission to provide you with exceptional heart care, we have created designated Provider Care Teams.  These Care Teams include your primary Cardiologist (physician) and Advanced Practice Providers (APPs -  Physician Assistants and Nurse Practitioners) who all work together to provide you with the care you need, when you need it.   Your next appointment:   6 month(s)  Provider:   Debbe Odea, MD      Signed, Debbe Odea, MD  10/28/2023 12:21 PM    [facility]

## 2023-10-28 NOTE — Patient Instructions (Signed)
Medication Instructions:  No changes at this time.   *If you need a refill on your cardiac medications before your next appointment, please call your pharmacy*   Lab Work: None  If you have labs (blood work) drawn today and your tests are completely normal, you will receive your results only by: [place] (if you have MyChart) OR A paper copy in the mail If you have any lab test that is abnormal or we need to change your treatment, we will call you to review the results.   Testing/Procedures: None   Follow-Up: At [facility], you and your health needs are our priority.  As part of our continuing mission to provide you with exceptional heart care, we have created designated Provider Care Teams.  These Care Teams include your primary Cardiologist (physician) and Advanced Practice Providers (APPs -  Physician Assistants and Nurse Practitioners) who all work together to provide you with the care you need, when you need it.   Your next appointment:   6 month(s)  Provider:   Kate Sable, MD

## 2023-10-30 ENCOUNTER — Encounter: Payer: Self-pay | Admitting: Nurse Practitioner

## 2023-10-30 ENCOUNTER — Ambulatory Visit: Payer: Medicare PPO | Admitting: Nurse Practitioner

## 2023-10-30 VITALS — BP 120/72 | HR 94 | Temp 98.1°F | Ht 65.0 in | Wt 199.4 lb

## 2023-10-30 DIAGNOSIS — R7989 Other specified abnormal findings of blood chemistry: Secondary | ICD-10-CM | POA: Diagnosis not present

## 2023-10-30 DIAGNOSIS — I5032 Chronic diastolic (congestive) heart failure: Secondary | ICD-10-CM

## 2023-10-30 DIAGNOSIS — G4733 Obstructive sleep apnea (adult) (pediatric): Secondary | ICD-10-CM

## 2023-10-30 DIAGNOSIS — E782 Mixed hyperlipidemia: Secondary | ICD-10-CM | POA: Diagnosis not present

## 2023-10-30 DIAGNOSIS — J9611 Chronic respiratory failure with hypoxia: Secondary | ICD-10-CM

## 2023-10-30 DIAGNOSIS — Z7985 Long-term (current) use of injectable non-insulin antidiabetic drugs: Secondary | ICD-10-CM

## 2023-10-30 DIAGNOSIS — I10 Essential (primary) hypertension: Secondary | ICD-10-CM

## 2023-10-30 DIAGNOSIS — E1121 Type 2 diabetes mellitus with diabetic nephropathy: Secondary | ICD-10-CM | POA: Diagnosis not present

## 2023-10-30 NOTE — Assessment & Plan Note (Signed)
 Blood pressure is well controlled with Losartan 25 mg daily and Carvedilol 12.5 mg twice daily with no new symptoms of chest pain or dizziness. Continue current regimen.

## 2023-10-30 NOTE — Assessment & Plan Note (Signed)
 Her condition is well controlled with Losartan 25 mg daily, Carvedilol 12.5 mg twice daily, and Lasix 20 mg daily, with no new symptoms of chest pain or dizziness. Continue current regimen. Euvolemic on exam. Follow up with Cardiology as scheduled.

## 2023-10-30 NOTE — Assessment & Plan Note (Signed)
 She is on Pravastatin 40 mg daily and Zetia 10 mg daily, experiences chronic muscle aches but tolerating the regimen. Continue current regimen. We will check lipid panel today.

## 2023-10-30 NOTE — Assessment & Plan Note (Signed)
 A1c was 7.1 in September, controlled with Trulicity once weekly. She has no symptoms of hyperglycemia or hypoglycemia and maintains regular ophthalmology follow-ups. Continue Trulicity. Check A1c today.

## 2023-10-30 NOTE — Progress Notes (Signed)
 Betty Dicker, NP-C Phone: [phone]  Betty [name] is a 78 y.o. female who presents today for transfer of care.   Discussed the use of AI scribe software for clinical note transcription with the patient, who gave verbal consent to proceed.  History of Present Illness   Betty [name] is a 78 year old female with diabetes and congestive heart failure who presents for transfer of care.  She is focused on diabetes management. Her last A1c in September was 7.1, indicating adequately controlled diabetes. She is on Trulicity once a week and does not regularly check her blood glucose. No symptoms of polydipsia, polyuria, or hypoglycemia. She regularly sees an ophthalmologist.  She has a history of congestive heart failure and is under the care of a cardiologist. Her medications include losartan, carvedilol, and Lasix. She experiences edema and dyspnea, which she describes as consistent with her baseline. No angina or vertigo.  She has a history of restrictive lung disease, pulmonary hypertension and obstructive sleep apnea. She uses a CPAP machine and supplemental oxygen every night. Her oxygen saturation today was 94%, though it is usually around 91%. She has a pulse oximeter at home but only uses it when she feels very short of breath.  She has hyperlipidemia and is on pravastatin and Zetia for cholesterol management. She experiences myalgia, which she attributes to the statin, but she tolerates it.      Social History   Tobacco Use  Smoking Status Never  Smokeless Tobacco Never    Current Outpatient Medications on File Prior to Visit  Medication Sig Dispense Refill   aspirin 81 MG EC tablet Take 1 tablet (81 mg total) by mouth daily. 90 tablet 2   CALCIUM-MAGNESIUM-VITAMIN D PO Take 1 tablet by mouth daily.     carvedilol (COREG) 12.5 MG tablet TAKE 1 TABLET (12.5MG  TOTAL) BY MOUTH TWICE A DAY WITH MEALS 180 tablet 1   Coenzyme Q10 (CO Q 10 PO) Take 200 mg by mouth at bedtime.       Colchicine (MITIGARE) 0.6 MG CAPS Day 1: take 1.2 mg (2 tablets) by mouth at the first sign of flare, followed by 0.6 mg (1 tablet) by mouth after 1 hour. Day 2 and thereafter: take 0.6 mg (1 tablet) by mouth daily until flare has resolved. (Patient taking differently: daily as needed. Day 1: take 1.2 mg (2 tablets) by mouth at the first sign of flare, followed by 0.6 mg (1 tablet) by mouth after 1 hour. Day 2 and thereafter: take 0.6 mg (1 tablet) by mouth daily until flare has resolved.) 30 capsule 0   Dulaglutide (TRULICITY) 0.75 MG/0.5ML SOAJ INJECT 0.75 MG SUBCUTANEOUSLY ONE TIME PER WEEK 6 mL 1   estradiol (ESTRACE) 0.1 MG/GM vaginal cream Place 0.25 Applicatorfuls vaginally 3 (three) times a week. 90 g 3   ezetimibe (ZETIA) 10 MG tablet TAKE 1 TABLET BY MOUTH EVERY DAY 90 tablet 1   Fish Oil OIL Take 1,200 mg by mouth 2 (two) times daily. GUMMIES     fluticasone (FLONASE) 50 MCG/ACT nasal spray SPRAY 2 SPRAYS INTO EACH NOSTRIL EVERY DAY 48 mL 2   furosemide (LASIX) 20 MG tablet Take 1 tablet (20 mg total) by mouth daily. 90 tablet 3   losartan (COZAAR) 50 MG tablet TAKE 1/2 TABLET BY MOUTH EVERY DAY 45 tablet 3   multivitamin (THERAGRAN) per tablet Take 1 tablet by mouth daily.     OXYGEN Inhale 3 L into the lungs at bedtime.  polyvinyl alcohol (LIQUIFILM TEARS) 1.4 % ophthalmic solution Place 1 drop into both eyes every morning.     pravastatin (PRAVACHOL) 40 MG tablet TAKE 1 TABLET BY MOUTH EVERY DAY 90 tablet 3   No current facility-administered medications on file prior to visit.    ROS see history of present illness  Objective  Physical Exam Vitals:   10/30/23 1403  BP: 120/72  Pulse: 94  Temp: 98.1 F (36.7 C)  SpO2: 94%    BP Readings from Last 3 Encounters:  10/30/23 120/72  05/29/23 114/64  05/27/23 122/76   Wt Readings from Last 3 Encounters:  10/30/23 199 lb 6.4 oz (90.4 kg)  10/28/23 197 lb 3.2 oz (89.4 kg)  05/29/23 197 lb 12.8 oz (89.7 kg)     Physical Exam Constitutional:      General: She is not in acute distress.    Appearance: Normal appearance.  HENT:     Head: Normocephalic.  Cardiovascular:     Rate and Rhythm: Normal rate and regular rhythm.     Heart sounds: Normal heart sounds.  Pulmonary:     Effort: Pulmonary effort is normal.     Breath sounds: Normal breath sounds.  Skin:    General: Skin is warm and dry.  Neurological:     General: No focal deficit present.     Mental Status: She is alert.  Psychiatric:        Mood and Affect: Mood normal.        Behavior: Behavior normal.     Assessment/Plan: Please see individual problem list.  Controlled type 2 diabetes mellitus with diabetic nephropathy, without long-term current use of insulin (HCC) Assessment & Plan: A1c was 7.1 in September, controlled with Trulicity once weekly. She has no symptoms of hyperglycemia or hypoglycemia and maintains regular ophthalmology follow-ups. Continue Trulicity. Check A1c today.  Orders: -     Hemoglobin A1c  Chronic diastolic heart failure (HCC) Assessment & Plan: Her condition is well controlled with Losartan 25 mg daily, Carvedilol 12.5 mg twice daily, and Lasix 20 mg daily, with no new symptoms of chest pain or dizziness. Continue current regimen. Euvolemic on exam. Follow up with Cardiology as scheduled.    Primary hypertension Assessment & Plan: Blood pressure is well controlled with Losartan 25 mg daily and Carvedilol 12.5 mg twice daily with no new symptoms of chest pain or dizziness. Continue current regimen.   Chronic hypoxemic respiratory failure (HCC) Assessment & Plan: She experiences constant shortness of breath without worsening from baseline, using oxygen at night and occasionally during the day. Continue current regimen. O2 in office on room air 94% today. Follow up with Pulmonology as scheduled.   Orders: -     CBC with Differential/Platelet  HYPERLIPIDEMIA, MIXED Assessment & Plan: She is  on Pravastatin 40 mg daily and Zetia 10 mg daily, experiences chronic muscle aches but tolerating the regimen. Continue current regimen. We will check lipid panel today.   Orders: -     Comprehensive metabolic panel -     Lipid panel  Obstructive sleep apnea Assessment & Plan: She uses CPAP every night. Good response. Continue CPAP. Follow up with Pulmonology as scheduled.    Elevated TSH -     TSH   Return in about 7 months (around 05/26/2024) for Annual Exam, sooner as needed.   Betty Dicker, NP-C [facility]

## 2023-10-30 NOTE — Assessment & Plan Note (Signed)
 She experiences constant shortness of breath without worsening from baseline, using oxygen at night and occasionally during the day. Continue current regimen. O2 in office on room air 94% today. Follow up with Pulmonology as scheduled.

## 2023-10-30 NOTE — Assessment & Plan Note (Signed)
 She uses CPAP every night. Good response. Continue CPAP. Follow up with Pulmonology as scheduled.

## 2023-10-31 LAB — CBC WITH DIFFERENTIAL/PLATELET
Basophils Absolute: 0.1 10*3/uL (ref 0.0–0.1)
Basophils Relative: 1.1 % (ref 0.0–3.0)
Eosinophils Absolute: 0.1 10*3/uL (ref 0.0–0.7)
Eosinophils Relative: 1.3 % (ref 0.0–5.0)
HCT: 44.7 % (ref 36.0–46.0)
Hemoglobin: 14.9 g/dL (ref 12.0–15.0)
Lymphocytes Relative: 30.5 % (ref 12.0–46.0)
Lymphs Abs: 2.2 10*3/uL (ref 0.7–4.0)
MCHC: 33.4 g/dL (ref 30.0–36.0)
MCV: 100.9 fl — ABNORMAL HIGH (ref 78.0–100.0)
Monocytes Absolute: 0.8 10*3/uL (ref 0.1–1.0)
Monocytes Relative: 11.1 % (ref 3.0–12.0)
Neutro Abs: 4.1 10*3/uL (ref 1.4–7.7)
Neutrophils Relative %: 56 % (ref 43.0–77.0)
Platelets: 217 10*3/uL (ref 150.0–400.0)
RBC: 4.43 Mil/uL (ref 3.87–5.11)
RDW: 12.6 % (ref 11.5–15.5)
WBC: 7.3 10*3/uL (ref 4.0–10.5)

## 2023-10-31 LAB — TSH: TSH: 3.75 u[IU]/mL (ref 0.35–5.50)

## 2023-10-31 LAB — COMPREHENSIVE METABOLIC PANEL
ALT: 22 U/L (ref 0–35)
AST: 24 U/L (ref 0–37)
Albumin: 3.9 g/dL (ref 3.5–5.2)
Alkaline Phosphatase: 68 U/L (ref 39–117)
BUN: 12 mg/dL (ref 6–23)
CO2: 35 meq/L — ABNORMAL HIGH (ref 19–32)
Calcium: 9.7 mg/dL (ref 8.4–10.5)
Chloride: 98 meq/L (ref 96–112)
Creatinine, Ser: 0.56 mg/dL (ref 0.40–1.20)
GFR: 88.46 mL/min (ref >60.00)
Glucose, Bld: 94 mg/dL (ref 70–99)
Potassium: 4.1 meq/L (ref 3.5–5.1)
Sodium: 140 meq/L (ref 135–145)
Total Bilirubin: 0.4 mg/dL (ref 0.2–1.2)
Total Protein: 7.5 g/dL (ref 6.0–8.3)

## 2023-10-31 LAB — HEMOGLOBIN A1C: Hgb A1c MFr Bld: 7.2 % — ABNORMAL HIGH (ref 4.6–6.5)

## 2023-10-31 LAB — LIPID PANEL
Cholesterol: 153 mg/dL (ref 0–200)
HDL: 62.4 mg/dL
LDL Cholesterol: 44 mg/dL (ref 0–99)
NonHDL: 91.07
Total CHOL/HDL Ratio: 2
Triglycerides: 236 mg/dL — ABNORMAL HIGH (ref 0.0–149.0)
VLDL: 47.2 mg/dL — ABNORMAL HIGH (ref 0.0–40.0)

## 2023-11-18 ENCOUNTER — Encounter: Payer: Self-pay | Admitting: Nurse Practitioner

## 2023-11-25 ENCOUNTER — Ambulatory Visit: Payer: Medicare PPO | Admitting: Family Medicine

## 2023-12-16 ENCOUNTER — Encounter: Payer: Self-pay | Admitting: Nurse Practitioner

## 2023-12-25 ENCOUNTER — Other Ambulatory Visit: Payer: Self-pay

## 2023-12-25 MED ORDER — LOSARTAN POTASSIUM 50 MG PO TABS: 25.0000 mg | ORAL_TABLET | Freq: Every day | ORAL | 3 refills | Status: DC

## 2024-01-06 ENCOUNTER — Other Ambulatory Visit: Payer: Self-pay

## 2024-01-06 MED ORDER — CARVEDILOL 12.5 MG PO TABS: ORAL_TABLET | ORAL | 1 refills | Status: DC

## 2024-01-07 ENCOUNTER — Other Ambulatory Visit: Payer: Self-pay

## 2024-01-07 DIAGNOSIS — E782 Mixed hyperlipidemia: Secondary | ICD-10-CM

## 2024-01-07 MED ORDER — EZETIMIBE 10 MG PO TABS: 10.0000 mg | ORAL_TABLET | Freq: Every day | ORAL | 1 refills | Status: DC

## 2024-02-07 ENCOUNTER — Encounter: Payer: Self-pay | Admitting: Nurse Practitioner

## 2024-02-07 ENCOUNTER — Ambulatory Visit: Payer: Self-pay

## 2024-02-07 NOTE — Telephone Encounter (Signed)
 FYI Only or Action Required?: FYI only for provider  Patient was last seen in primary care on 10/30/2023 by Bluford Burkitt, NP. Called Nurse Triage reporting Joint Swelling. Symptoms began several days ago. Interventions attempted: Nothing. Symptoms are: unchanged.  Triage Disposition: Home Care  Patient/caregiver understands and will follow disposition?: Yes        Copied from CRM [id number]. Topic: Clinical - Red Word Triage >> Feb 07, 2024  1:38 PM Betty [name] wrote: [facility] that prompted transfer to Nurse Triage: Left ankle and foot swelling and pain. Patient stated this has been going on for 2 days, also experiencing pin points of pain on the top of her left foot. Reason for Disposition  [1] Localized swelling ([name].g., small area of puffy or swollen skin) AND [2] itchy  Answer Assessment - Initial Assessment Questions 1. LOCATION: Which ankle is swollen? Where is the swelling?     Bilateral top of foot and ankle 2. ONSET: When did the swelling start?     X 2 day 3. SWELLING: How bad is the swelling? Or, How large is it? ([name].g., mild, moderate, severe; size of localized swelling)    - NONE: No joint swelling.   - LOCALIZED: Localized; small area of puffy or swollen skin ([name].g., insect bite, skin irritation).   - MILD: Joint looks or feels mildly swollen or puffy.   - MODERATE: Swollen; interferes with normal activities ([name].g., work or school); decreased range of movement; may be limping.   - SEVERE: Very swollen; can't move swollen joint at all; limping a lot or unable to walk.     mild 4. PAIN: Is there any pain? If Yes, ask: How bad is it? (Scale 1-10; or mild, moderate, severe)   - NONE (0): no pain.   - MILD (1-3): doesn't interfere with normal activities.    - MODERATE (4-7): interferes with normal activities ([name].g., work or school) or awakens from sleep, limping.    - SEVERE (8-10): excruciating pain, unable to do any normal activities, unable to walk.      4/10 5.  CAUSE: What do you think caused the ankle swelling?     unknown 6. OTHER SYMPTOMS: Do you have any other symptoms? ([name].g., fever, chest pain, difficulty breathing, calf pain)    Tenderness on top of left foot-  Husband states that she had two little brown knots about an inch apart he felt on yesterday but no redness or warmth  Protocols used: Ankle Swelling-A-AH

## 2024-02-10 NOTE — Telephone Encounter (Signed)
 Vm left asking pt to CB

## 2024-02-11 ENCOUNTER — Other Ambulatory Visit: Payer: Self-pay

## 2024-02-11 DIAGNOSIS — N952 Postmenopausal atrophic vaginitis: Secondary | ICD-10-CM

## 2024-02-11 MED ORDER — ESTRADIOL 0.1 MG/GM VA CREA: 0.2500 | TOPICAL_CREAM | VAGINAL | 0 refills | Status: DC

## 2024-02-12 ENCOUNTER — Ambulatory Visit: Admitting: Obstetrics and Gynecology

## 2024-02-13 ENCOUNTER — Other Ambulatory Visit: Payer: Self-pay | Admitting: Nurse Practitioner

## 2024-02-13 DIAGNOSIS — Z1231 Encounter for screening mammogram for malignant neoplasm of breast: Secondary | ICD-10-CM

## 2024-03-10 ENCOUNTER — Ambulatory Visit: Admitting: Obstetrics and Gynecology

## 2024-03-10 ENCOUNTER — Encounter: Payer: Self-pay | Admitting: Obstetrics and Gynecology

## 2024-03-10 VITALS — BP 122/73 | HR 91 | Ht 65.0 in | Wt 201.2 lb

## 2024-03-10 DIAGNOSIS — L900 Lichen sclerosus et atrophicus: Secondary | ICD-10-CM | POA: Diagnosis not present

## 2024-03-10 DIAGNOSIS — Z01419 Encounter for gynecological examination (general) (routine) without abnormal findings: Secondary | ICD-10-CM

## 2024-03-10 DIAGNOSIS — Z0000 Encounter for general adult medical examination without abnormal findings: Secondary | ICD-10-CM

## 2024-03-10 DIAGNOSIS — N952 Postmenopausal atrophic vaginitis: Secondary | ICD-10-CM | POA: Diagnosis not present

## 2024-03-10 MED ORDER — ESTRADIOL 0.1 MG/GM VA CREA: 0.2500 | TOPICAL_CREAM | VAGINAL | 3 refills | Status: AC

## 2024-03-10 MED ORDER — CLOBETASOL PROPIONATE 0.05 % EX OINT: TOPICAL_OINTMENT | CUTANEOUS | 5 refills | Status: AC

## 2024-03-10 NOTE — Progress Notes (Signed)
 HPI:      Betty [name] is a 78 y.o. G2P2 who LMP was No LMP recorded. Patient is postmenopausal.  Subjective:   She presents today for her annual examination.  She has no complaints at this time.  She is using clobetasol  and estrogen vaginal cream alternating.  She reports that she has no further vaginal bleeding and is not having any issues with itching or burning of the vulva.  She is very happy on her current regimen.    Hx: The following portions of the patient's history were reviewed and updated as appropriate:             She  has a past medical history of Allergic rhinitis, Arthritis, Cataracts, bilateral, CHOLECYSTECTOMY, HX OF (10/08/2007), Chronic diastolic CHF (congestive heart failure) (HCC), Chronic respiratory failure (HCC), Diabetes mellitus without complication (HCC), Diverticulosis, Glucose intolerance (impaired glucose tolerance), Gout, History of chicken pox, Hyperlipidemia, Hypertension, NICM (nonischemic cardiomyopathy) (HCC) (2002), Obesity, Oxygen  deficiency (2017), PONV (postoperative nausea and vomiting), Restrictive lung disease, Skin cancer, and Sleep apnea. She does not have any pertinent problems on file. She  has a past surgical history that includes nsvd; Cystectomy (1996); choleystectomy (02/2002); Vaginal delivery; Tubal ligation (1975); Dilation and curettage of uterus (2011); Induced abortion; Cardiac catheterization; Bilateral VATS ablation (Right); Colonoscopy with propofol  (N/A, 12/20/2014); Breast excisional biopsy (Left, 80's); Cholecystectomy; Colonoscopy with propofol  (N/A, 03/22/2020); polypectomy (03/22/2020); Cataract extraction w/PHACO (Right, 06/21/2020); and Cataract extraction w/PHACO (Left, 07/12/2020). Her family history includes Bladder Cancer in her maternal grandmother; COPD in her father; Depression in her sister; Diabetes in her father, paternal grandfather, and sister; Heart attack in her father; Heart disease in her father and paternal  grandfather; Hyperlipidemia in her father; Hypertension in her brother and father; Leukemia in her maternal grandfather; Liver disease in her sister; Lymphoma (age of onset: 72) in her mother; Meniere's disease in her brother; Pneumonia in her father; Schizophrenia in her daughter; Stroke in her father. She  reports that she has never smoked. She has never used smokeless tobacco. She reports that she does not drink alcohol and does not use drugs. She has a current medication list which includes the following prescription(s): aspirin  ec, calcium -magnesium-vitamin d, carvedilol , clobetasol  ointment, coenzyme q10, colchicine , trulicity , ezetimibe , fish oil, fluticasone , furosemide , losartan , multivitamin, oxygen -helium, artificial tears, pravastatin , and [START ON 03/11/2024] estradiol . She is allergic to amoxicillin, etodolac, hydrochlorothiazide , meloxicam, sulfa antibiotics, allopurinol , metformin  and related, rosuvastatin , sulfonamide derivatives, jardiance  [empagliflozin ], and lasix  [furosemide ].       Review of Systems:  Review of Systems  Constitutional: Denied constitutional symptoms, night sweats, recent illness, fatigue, fever, insomnia and weight loss.  Eyes: Denied eye symptoms, eye pain, photophobia, vision change and visual disturbance.  Ears/Nose/Throat/Neck: Denied ear, nose, throat or neck symptoms, hearing loss, nasal discharge, sinus congestion and sore throat.  Cardiovascular: Denied cardiovascular symptoms, arrhythmia, chest pain/pressure, edema, exercise intolerance, orthopnea and palpitations.  Respiratory: Denied pulmonary symptoms, asthma, pleuritic pain, productive sputum, cough, dyspnea and wheezing.  Gastrointestinal: Denied, gastro-esophageal reflux, melena, nausea and vomiting.  Genitourinary: Denied genitourinary symptoms including symptomatic vaginal discharge, pelvic relaxation issues, and urinary complaints.  Musculoskeletal: Denied musculoskeletal symptoms, stiffness,  swelling, muscle weakness and myalgia.  Dermatologic: Denied dermatology symptoms, rash and scar.  Neurologic: Denied neurology symptoms, dizziness, headache, neck pain and syncope.  Psychiatric: Denied psychiatric symptoms, anxiety and depression.  Endocrine: Denied endocrine symptoms including hot flashes and night sweats.   Meds:   Current Outpatient Medications on File Prior to Visit  Medication Sig Dispense Refill   aspirin  81 MG EC tablet Take 1 tablet (81 mg total) by mouth daily. 90 tablet 2   CALCIUM -MAGNESIUM-VITAMIN D PO Take 1 tablet by mouth daily.     carvedilol  (COREG ) 12.5 MG tablet TAKE 1 TABLET (12.5MG  TOTAL) BY MOUTH TWICE A DAY WITH MEALS 180 tablet 1   Coenzyme Q10 (CO Q 10 PO) Take 200 mg by mouth at bedtime.      Colchicine  (MITIGARE ) 0.6 MG CAPS Day 1: take 1.2 mg (2 tablets) by mouth at the first sign of flare, followed by 0.6 mg (1 tablet) by mouth after 1 hour. Day 2 and thereafter: take 0.6 mg (1 tablet) by mouth daily until flare has resolved. (Patient taking differently: daily as needed. Day 1: take 1.2 mg (2 tablets) by mouth at the first sign of flare, followed by 0.6 mg (1 tablet) by mouth after 1 hour. Day 2 and thereafter: take 0.6 mg (1 tablet) by mouth daily until flare has resolved.) 30 capsule 0   Dulaglutide  (TRULICITY ) 0.75 MG/0.5ML SOAJ INJECT 0.75 MG SUBCUTANEOUSLY ONE TIME PER WEEK 6 mL 1   ezetimibe  (ZETIA ) 10 MG tablet Take 1 tablet (10 mg total) by mouth daily. 90 tablet 1   Fish Oil OIL Take 1,200 mg by mouth 2 (two) times daily. GUMMIES     fluticasone  (FLONASE ) 50 MCG/ACT nasal spray SPRAY 2 SPRAYS INTO EACH NOSTRIL EVERY DAY 48 mL 2   furosemide  (LASIX ) 20 MG tablet Take 1 tablet (20 mg total) by mouth daily. 90 tablet 3   losartan  (COZAAR ) 50 MG tablet Take 0.5 tablets (25 mg total) by mouth daily. 45 tablet 3   multivitamin (THERAGRAN) per tablet Take 1 tablet by mouth daily.     OXYGEN  Inhale 3 L into the lungs at bedtime.     polyvinyl  alcohol (LIQUIFILM TEARS) 1.4 % ophthalmic solution Place 1 drop into both eyes every morning.     pravastatin  (PRAVACHOL ) 40 MG tablet TAKE 1 TABLET BY MOUTH EVERY DAY 90 tablet 3   No current facility-administered medications on file prior to visit.     Objective:     Vitals:   03/10/24 0933  BP: 122/73  Pulse: 91    Filed Weights   03/10/24 0933  Weight: 201 lb 3.2 oz (91.3 kg)              Physical examination General NAD, Conversant  HEENT Atraumatic; Op clear with mmm.  Normo-cephalic.  Anicteric sclerae  Thyroid /Neck Smooth without nodularity or enlargement. Normal ROM.  Neck Supple.  Skin No rashes, lesions or ulceration. Normal palpated skin turgor. No nodularity.  Breasts: No masses or discharge.  Symmetric.  No axillary adenopathy.  Lungs: Clear to auscultation.No rales or wheezes. Normal Respiratory effort, no retractions.  Heart: NSR.  No murmurs or rubs appreciated. No peripheral edema  Abdomen: Soft.  Non-tender.  No masses.  No HSM. No hernia  Extremities: Moves all appropriately.  Normal ROM for age. No lymphadenopathy.  Neuro: Oriented to PPT.  Normal mood. Normal affect.     Pelvic: Deferred by patient    Assessment:    G2P2 Patient Active Problem List   Diagnosis Date Noted   Primary hypertension 10/30/2023   Onychomycosis 11/19/2022   Elevated TSH 11/14/2021   Abdominal discomfort in right upper quadrant 11/14/2021   Venous insufficiency 05/17/2021   Benign neoplasm of sigmoid colon    Pulmonary HTN (HCC) 10/27/2019   Restrictive lung disease 08/06/2016  Left-sided low back pain with left-sided sciatica 05/01/2016   Routine general medical examination at a health care facility 01/27/2016   Benign neoplasm of ascending colon    Benign neoplasm of transverse colon    Chronic diastolic heart failure (HCC) 04/08/2014   Obesity (BMI 30-39.9) 04/09/2013   Diabetes mellitus type 2, controlled (HCC) 01/07/2013   Edema 01/07/2013   Chronic  hypoxemic respiratory failure (HCC) 11/11/2012   Obstructive sleep apnea 11/11/2012   GOUT 12/26/2009   ALLERGIC RHINITIS, SEASONAL 10/08/2007   HYPERLIPIDEMIA, MIXED 05/02/2007     1. Lichen sclerosus   2. Vaginal atrophy   3. Encounter for annual physical exam     Doing well with vaginal atrophy and lichen sclerosus.   Plan:            1.  Basic Screening Recommendations The basic screening recommendations for asymptomatic women were discussed with the patient during her visit.  The age-appropriate recommendations were discussed with her and the rational for the tests reviewed.  When I am informed by the patient that another primary care physician has previously obtained the age-appropriate tests and they are up-to-date, only outstanding tests are ordered and referrals given as necessary.  Abnormal results of tests will be discussed with her when all of her results are completed.  Routine preventative health maintenance measures emphasized: Exercise/Diet/Weight control, Tobacco Warnings, Alcohol/Substance use risks and Stress Management 2.  Continue vaginal estrogen and clobetasol . Orders No orders of the defined types were placed in this encounter.    Meds ordered this encounter  Medications   estradiol  (ESTRACE ) 0.1 MG/GM vaginal cream    Sig: Place 0.25 Applicatorfuls vaginally 3 (three) times a week.    Dispense:  90 g    Refill:  3   clobetasol  ointment (TEMOVATE ) 0.05 %    Sig: Place 0.25 Applicator vaginally once weekly.    Dispense:  30 g    Refill:  5          F/U  Return in about 1 year (around 03/10/2025) for Annual Physical.  Alm DOROTHA Sar, M.D. 03/10/2024 9:59 AM

## 2024-03-10 NOTE — Progress Notes (Signed)
 Patients presents for annual exam today. She states doing well with current treatment plan for lichen sclerous. Currently alternates between vaginal estradiol  twice weekly and clobetasol  once weekly. She states no other questions or concerns at this time.

## 2024-03-17 ENCOUNTER — Ambulatory Visit
Admission: RE | Admit: 2024-03-17 | Discharge: 2024-03-17 | Disposition: A | Discharge status: Home / Self Care | Source: Ambulatory Visit | Attending: Nurse Practitioner | Admitting: Nurse Practitioner

## 2024-03-17 DIAGNOSIS — Z1231 Encounter for screening mammogram for malignant neoplasm of breast: Secondary | ICD-10-CM | POA: Diagnosis present

## 2024-03-19 ENCOUNTER — Ambulatory Visit: Payer: Self-pay | Admitting: Nurse Practitioner

## 2024-03-25 ENCOUNTER — Other Ambulatory Visit: Payer: Self-pay | Admitting: Cardiology

## 2024-04-22 ENCOUNTER — Other Ambulatory Visit: Payer: Self-pay | Admitting: Cardiology

## 2024-04-22 ENCOUNTER — Ambulatory Visit (INDEPENDENT_AMBULATORY_CARE_PROVIDER_SITE_OTHER): Payer: Medicare PPO | Admitting: *Deleted

## 2024-04-22 ENCOUNTER — Other Ambulatory Visit: Payer: Self-pay | Admitting: Nurse Practitioner

## 2024-04-22 VITALS — Ht 65.0 in | Wt 200.0 lb

## 2024-04-22 DIAGNOSIS — E782 Mixed hyperlipidemia: Secondary | ICD-10-CM

## 2024-04-22 DIAGNOSIS — Z0000 Encounter for general adult medical examination without abnormal findings: Secondary | ICD-10-CM | POA: Diagnosis not present

## 2024-04-22 MED ORDER — TRULICITY 0.75 MG/0.5ML ~~LOC~~ SOAJ: 0.7500 mg | SUBCUTANEOUS | 1 refills | Status: DC

## 2024-04-22 NOTE — Progress Notes (Signed)
 Subjective:   Betty [name] is a 78 y.o. who presents for a Medicare Wellness preventive visit.  As a reminder, Annual Wellness Visits don't include a physical exam, and some assessments may be limited, especially if this visit is performed virtually. We may recommend an in-person follow-up visit with your provider if needed.  Visit Complete: Virtual I connected with  Betty [name] on 04/22/24 by a audio enabled telemedicine application and verified that I am speaking with the correct person using two identifiers.  Patient Location: Home  Provider Location: Home Office  I discussed the limitations of evaluation and management by telemedicine. The patient expressed understanding and agreed to proceed.  Vital Signs: Because this visit was a virtual/telehealth visit, some criteria may be missing or patient reported. Any vitals not documented were not able to be obtained and vitals that have been documented are patient reported.  VideoDeclined- This patient declined Librarian, academic. Therefore the visit was completed with audio only.  Persons Participating in Visit: Patient.  AWV Questionnaire: Yes: Patient Medicare AWV questionnaire was completed by the patient on 04/21/24; I have confirmed that all information answered by patient is correct and no changes since this date.  Cardiac Risk Factors include: advanced age (>36men, >4 women);diabetes mellitus;dyslipidemia;hypertension;obesity (BMI >30kg/m2)     Objective:    Today's Vitals   04/22/24 0927  Weight: 200 lb (90.7 kg)  Height: 5' 5 (1.651 m)   Body mass index is 33.28 kg/m.     04/22/2024    9:41 AM 04/19/2023    9:51 AM 04/17/2022    2:02 PM 05/04/2021    2:46 PM 04/13/2021   10:19 AM 07/12/2020    7:34 AM 06/21/2020    8:40 AM  Advanced Directives  Does Patient Have a Medical Advance Directive? No No No No No  No  Would patient like information on creating a medical advance  directive? No - Patient declined No - Patient declined No - Patient declined No - Patient declined No - Patient declined No - Patient declined No - Patient declined    Current Medications (verified) Outpatient Encounter Medications as of 04/22/2024  Medication Sig   acetaminophen  (TYLENOL ) 500 MG tablet Take 500 mg by mouth every 8 (eight) hours as needed.   aspirin  81 MG EC tablet Take 1 tablet (81 mg total) by mouth daily.   CALCIUM -MAGNESIUM-VITAMIN D PO Take 1 tablet by mouth daily.   carvedilol  (COREG ) 12.5 MG tablet TAKE 1 TABLET (12.5MG  TOTAL) BY MOUTH TWICE A DAY WITH MEALS   clobetasol  ointment (TEMOVATE ) 0.05 % Place 0.25 Applicator vaginally once weekly.   Coenzyme Q10 (CO Q 10 PO) Take 200 mg by mouth at bedtime.    Colchicine  (MITIGARE ) 0.6 MG CAPS Day 1: take 1.2 mg (2 tablets) by mouth at the first sign of flare, followed by 0.6 mg (1 tablet) by mouth after 1 hour. Day 2 and thereafter: take 0.6 mg (1 tablet) by mouth daily until flare has resolved.   Dulaglutide  (TRULICITY ) 0.75 MG/0.5ML SOAJ INJECT 0.75 MG SUBCUTANEOUSLY ONE TIME PER WEEK   estradiol  (ESTRACE ) 0.1 MG/GM vaginal cream Place 0.25 Applicatorfuls vaginally 3 (three) times a week.   ezetimibe  (ZETIA ) 10 MG tablet Take 1 tablet (10 mg total) by mouth daily.   Fish Oil OIL Take 1,200 mg by mouth 2 (two) times daily. GUMMIES   fluticasone  (FLONASE ) 50 MCG/ACT nasal spray SPRAY 2 SPRAYS INTO EACH NOSTRIL EVERY DAY   furosemide  (LASIX ) 20  MG tablet TAKE 1 TABLET BY MOUTH EVERY DAY (Patient taking differently: Take 20 mg by mouth daily. As needed)   losartan  (COZAAR ) 50 MG tablet Take 0.5 tablets (25 mg total) by mouth daily.   multivitamin (THERAGRAN) per tablet Take 1 tablet by mouth daily.   OXYGEN  Inhale 3 L into the lungs at bedtime.   polyvinyl alcohol (LIQUIFILM TEARS) 1.4 % ophthalmic solution Place 1 drop into both eyes every morning.   pravastatin  (PRAVACHOL ) 40 MG tablet TAKE 1 TABLET BY MOUTH EVERY DAY  (Patient taking differently: Take 40 mg by mouth every other day.)   No facility-administered encounter medications on file as of 04/22/2024.    Allergies (verified) Amoxicillin, Etodolac, Hydrochlorothiazide , Meloxicam, Sulfa antibiotics, Allopurinol , Metformin  and related, Rosuvastatin , Sulfonamide derivatives, Jardiance  [empagliflozin ], and Lasix  [furosemide ]   History: Past Medical History:  Diagnosis Date   Allergic rhinitis    never tested, fall and spring   Arthritis    hands,knees, feet   Cataracts, bilateral    not surgical yet   CHOLECYSTECTOMY, HX OF 10/08/2007   Qualifier: Diagnosis of  By: Bartley MD, Lamar Mulch    Chronic diastolic CHF (congestive heart failure) (HCC)    Chronic respiratory failure (HCC)    Diabetes mellitus without complication (HCC)    Diverticulosis    problems with frequent gas, burping   Glucose intolerance (impaired glucose tolerance)    Gout    History of chicken pox    Hyperlipidemia    Hypertension    NICM (nonischemic cardiomyopathy) (HCC) 2002   EF 25%; improved to normal - echo 4/08: EF 50-55%, mild MR, mild LAE, mild TR;    cath 3/03: normal cors, EF 40%   Obesity    Oxygen  deficiency 2017   at night   PONV (postoperative nausea and vomiting)    after wisdom tooth extraction   Restrictive lung disease    Skin cancer    skin cancers only   Sleep apnea    cpap settings 4   Past Surgical History:  Procedure Laterality Date   BILATERAL VATS ABLATION Right    biopsy done 2015- [facility]   BREAST EXCISIONAL BIOPSY Left 80's   NEG   CARDIAC CATHETERIZATION     CATARACT EXTRACTION W/PHACO Right 06/21/2020   Procedure: CATARACT EXTRACTION PHACO AND INTRAOCULAR LENS PLACEMENT (IOC) RIGHT DIABETIC;  Surgeon: Jaye Fallow, MD;  Location: [facility];  Service: Ophthalmology;  Laterality: Right;  4.06 0:32.8   CATARACT EXTRACTION W/PHACO Left 07/12/2020   Procedure: CATARACT EXTRACTION PHACO AND INTRAOCULAR LENS PLACEMENT  (IOC) LEFT DIABETIC 6.96 00:46.3;  Surgeon: Jaye Fallow, MD;  Location: [facility];  Service: Ophthalmology;  Laterality: Left;  Diabetic - oral meds   CHOLECYSTECTOMY     choleystectomy  02/2002   COLONOSCOPY WITH PROPOFOL  N/A 12/20/2014   Procedure: COLONOSCOPY WITH PROPOFOL ;  Surgeon: Gordy CHRISTELLA Starch, MD;  Location: [facility];  Service: Gastroenterology;  Laterality: N/A;   COLONOSCOPY WITH PROPOFOL  N/A 03/22/2020   Procedure: COLONOSCOPY WITH PROPOFOL ;  Surgeon: Starch Gordy CHRISTELLA, MD;  Location: [facility];  Service: Gastroenterology;  Laterality: N/A;   CYSTECTOMY  1996   l breast   DILATION AND CURETTAGE OF UTERUS  2011   [facility]   INDUCED ABORTION     nsvd     x 2   POLYPECTOMY  03/22/2020   Procedure: POLYPECTOMY;  Surgeon: Starch Gordy CHRISTELLA, MD;  Location: [facility];  Service: Gastroenterology;;   TUBAL LIGATION  1975   VAGINAL DELIVERY     x2   Family History  Problem Relation Age of Onset   Lymphoma Mother 55   COPD Father        was a smoker   Stroke Father    Pneumonia Father    Diabetes Father    Hyperlipidemia Father    Heart disease Father    Heart attack Father    Hypertension Father    Diabetes Sister    Depression Sister    Liver disease Sister    Meniere's disease Brother    Hypertension Brother    Schizophrenia Daughter    Bladder Cancer Maternal Grandmother    Leukemia Maternal Grandfather    Diabetes Paternal Grandfather    Heart disease Paternal Grandfather    Colon cancer Neg Hx    Stomach cancer Neg Hx    Breast cancer Neg Hx    Colon polyps Neg Hx    Esophageal cancer Neg Hx    Rectal cancer Neg Hx    Social History   Socioeconomic History   Marital status: Married    Spouse name: Not on file   Number of children: 2   Years of education: Not on file   Highest education level: Bachelor's degree (e.g., BA, AB, BS)  Occupational History   Occupation: Retired Magazine features editor: retired   Occupation:  Works at [employer]  Tobacco Use   Smoking status: Never   Smokeless tobacco: Never  Vaping Use   Vaping status: Never Used  Substance and Sexual Activity   Alcohol use: No    Alcohol/week: 0.0 standard drinks of alcohol   Drug use: No   Sexual activity: Not Currently    Birth control/protection: Post-menopausal, Surgical  Other Topics Concern   Not on file  Social History Narrative   Lives in [place] with husband. Worked at Runner, broadcasting/film/video at [employer]. 2 children. No pets.   Social Drivers of Corporate investment banker Strain: Low Risk  (04/21/2024)   Overall Financial Resource Strain (CARDIA)    Difficulty of Paying Living Expenses: Not hard at all  Food Insecurity: No Food Insecurity (04/21/2024)   Hunger Vital Sign    Worried About Running Out of Food in the Last Year: Never true    Ran Out of Food in the Last Year: Never true  Transportation Needs: No Transportation Needs (04/21/2024)   PRAPARE - Administrator, Civil Service (Medical): No    Lack of Transportation (Non-Medical): No  Physical Activity: Inactive (04/21/2024)   Exercise Vital Sign    Days of Exercise per Week: 0 days    Minutes of Exercise per Session: 0 min  Stress: No Stress Concern Present (04/21/2024)   [employer] of Occupational Health - Occupational Stress Questionnaire    Feeling of Stress: Not at all  Social Connections: Socially Integrated (04/21/2024)   Social Connection and Isolation Panel    Frequency of Communication with Friends and Family: More than three times a week    Frequency of Social Gatherings with Friends and Family: More than three times a week    Attends Religious Services: More than 4 times per year    Active Member of [employer] or Organizations: Yes    Attends Banker Meetings: 1 to 4 times per year    Marital Status: Married    Tobacco Counseling Counseling given: Not Answered    Clinical Intake:  Pre-visit preparation completed:  Yes  Pain : No/denies pain     BMI - recorded: 33.28 Nutritional Status: BMI > 30  Obese Nutritional Risks: None Diabetes: Yes CBG done?: No  Lab Results  Component Value Date   HGBA1C 7.2 (H) 10/30/2023   HGBA1C 7.1 (A) 05/27/2023   HGBA1C 6.4 (A) 11/19/2022     How often do you need to have someone help you when you read instructions, pamphlets, or other written materials from your doctor or pharmacy?: 1 - Never  Interpreter Needed?: No  Information entered by :: R. Jonesha Tsuchiya LPN   Activities of Daily Living     04/21/2024    7:59 PM  In your present state of health, do you have any difficulty performing the following activities:  Hearing? 0  Vision? 0  Difficulty concentrating or making decisions? 0  Walking or climbing stairs? 1  Dressing or bathing? 0  Doing errands, shopping? 0  Preparing Food and eating ? N  Using the Toilet? N  In the past six months, have you accidently leaked urine? Y  Do you have problems with loss of bowel control? N  Managing your Medications? N  Managing your Finances? N  Housekeeping or managing your Housekeeping? Y    Patient Care Team: Gretel App, NP as PCP - General (Nurse Practitioner) Shlomo Wilbert SAUNDERS, MD as PCP - Sleep Medicine (Sleep Medicine) Darliss Rogue, MD as PCP - Cardiology (Cardiology) Dasher, Alm LABOR, MD (Dermatology) Alaine Vicenta NOVAK, MD as Referring Physician (Internal Medicine)  I have updated your Care Teams any recent Medical Services you may have received from other providers in the past year.     Assessment:   This is a routine wellness examination for Daleysa.  Hearing/Vision screen Hearing Screening - Comments:: No issues Vision Screening - Comments:: glasses   Goals Addressed             This Visit's Progress    Patient Stated       Wants to try to get up and move more       Depression Screen     04/22/2024    9:35 AM 10/30/2023    2:14 PM 05/27/2023   10:50 AM 04/19/2023    9:46  AM 11/19/2022   10:35 AM 04/17/2022    2:09 PM 11/14/2021   10:34 AM  PHQ 2/9 Scores  PHQ - 2 Score 0 2 0 0 2 0 0  PHQ- 9 Score 7 8 0 6 5      Fall Risk     04/21/2024    7:59 PM 10/30/2023    2:13 PM 05/27/2023   10:50 AM 04/19/2023    9:43 AM 11/19/2022   10:35 AM  Fall Risk   Falls in the past year? 0 0 0 0 0  Number falls in past yr: 0 0 0 0 0  Injury with Fall? 0 0 0 0 0  Risk for fall due to : No Fall Risks No Fall Risks No Fall Risks No Fall Risks No Fall Risks  Follow up Falls evaluation completed;Falls prevention discussed Falls evaluation completed Falls evaluation completed Falls prevention discussed;Falls evaluation completed Falls evaluation completed    MEDICARE RISK AT HOME:  Medicare Risk at Home Any stairs in or around the home?: (Patient-Rptd) Yes If so, are there any without handrails?: (Patient-Rptd) No Home free of loose throw rugs in walkways, pet beds, electrical cords, etc?: (Patient-Rptd) Yes Adequate lighting in your home to reduce risk of falls?: (Patient-Rptd) Yes Life alert?: (  Patient-Rptd) No Use of a cane, Segundo or w/c?: (Patient-Rptd) No Grab bars in the bathroom?: (Patient-Rptd) Yes Shower chair or bench in shower?: (Patient-Rptd) No Elevated toilet seat or a handicapped toilet?: (Patient-Rptd) No  TIMED UP AND GO:  Was the test performed?  No  Cognitive Function: 6CIT completed    04/08/2018   11:27 AM 03/28/2017    3:23 PM 03/28/2016    3:33 PM  MMSE - Mini Mental State Exam  Orientation to time 5 5  5    Orientation to Place 5 5  5    Registration 3 3  3    Attention/ Calculation 5 5  5    Recall 3 3  3    Language- name 2 objects 2 2  2    Language- repeat 1 1 1   Language- follow 3 step command 3 3  3    Language- read & follow direction 1 1  1    Write a sentence 1 1  1    Copy design 1 1  1    Total score 30 30  30       Data saved with a previous flowsheet row definition        04/22/2024    9:41 AM 04/19/2023    9:51 AM 04/17/2022     2:36 PM 04/12/2020    9:47 AM 04/10/2019    9:51 AM  6CIT Screen  What Year? 0 points 0 points 0 points 0 points 0 points  What month? 0 points 0 points 0 points 0 points 0 points  What time? 0 points 0 points 0 points  0 points  Count back from 20 0 points 0 points   0 points  Months in reverse 0 points 0 points 0 points 0 points 0 points  Repeat phrase 0 points 0 points  0 points 0 points  Total Score 0 points 0 points   0 points    Immunizations Immunization History  Administered Date(s) Administered   Fluad Quad(high Dose 65+) 04/30/2019, 05/17/2021, 05/21/2022   Fluad Trivalent(High Dose 65+) 05/27/2023   H1N1 09/08/2008   INFLUENZA, HIGH DOSE SEASONAL PF 05/12/2018, 05/19/2020   Influenza Split 06/28/2011, 05/01/2012   Influenza Whole 07/11/2007, 06/07/2008, 07/12/2009   Influenza,inj,Quad PF,6+ Mos 04/23/2014, 04/21/2015   Influenza-Unspecified 04/27/2013, 05/04/2016, 04/24/2017   Moderna Covid-19 Fall Seasonal Vaccine 35yrs & older 08/03/2022, 05/31/2023   PFIZER Comirnaty(Gray Top)Covid-19 Tri-Sucrose Vaccine 12/23/2020   PFIZER(Purple Top)SARS-COV-2 Vaccination 09/10/2019, 10/01/2019, 05/30/2020, 12/23/2020   Pfizer Covid-19 Vaccine Bivalent Booster 29yrs & up 09/22/2021   Pneumococcal Conjugate-13 01/21/2014   Pneumococcal Polysaccharide-23 08/28/2011, 03/06/2012   Respiratory Syncytial Virus Vaccine,Recomb Aduvanted(Arexvy) 05/23/2022   Td 10/03/2007, 10/14/2013   Tdap 10/11/2013   Zoster Recombinant(Shingrix) 04/13/2019, 05/18/2019, 07/16/2019   Zoster, Live 06/21/2010    Screening Tests Health Maintenance  Topic Date Due   Diabetic kidney evaluation - Urine ACR  12/15/2010   OPHTHALMOLOGY EXAM  12/29/2022   FOOT EXAM  05/22/2023   DTaP/Tdap/Td (4 - Td or Tdap) 10/15/2023   COVID-19 Vaccine (9 - Pfizer risk 2024-25 season) 11/29/2023   INFLUENZA VACCINE  03/27/2024   HEMOGLOBIN A1C  05/01/2024   Diabetic kidney evaluation - eGFR measurement  10/29/2024    MAMMOGRAM  03/17/2025   Colonoscopy  03/22/2025   Medicare Annual Wellness (AWV)  04/22/2025   Pneumococcal Vaccine: 50+ Years  Completed   DEXA SCAN  Completed   Hepatitis C Screening  Completed   Zoster Vaccines- Shingrix  Completed   HPV VACCINES  Aged Out   Meningococcal  B Vaccine  Aged Out    Health Maintenance  Health Maintenance Due  Topic Date Due   Diabetic kidney evaluation - Urine ACR  12/15/2010   OPHTHALMOLOGY EXAM  12/29/2022   FOOT EXAM  05/22/2023   DTaP/Tdap/Td (4 - Td or Tdap) 10/15/2023   COVID-19 Vaccine (9 - Pfizer risk 2024-25 season) 11/29/2023   INFLUENZA VACCINE  03/27/2024   Health Maintenance Items Addressed: Discussed the need to update tetanus, flu and covid vaccines.  Patient needs a diabetic foot exam completed and documented at next office visit.  Additional Screening:  Vision Screening: Recommended annual ophthalmology exams for early detection of glaucoma and other disorders of the eye.  [facility]  Patient stated she had an eye exam in the past month but not sure that It was a diabetic eye exam.  Patient stated that she will call her eye doctor to see if it was a diabetic exam and if so will have not send notes. Patient will let her eye doctor know when she has her annual exam it should be a diabetic exam. Would you like a referral to an eye doctor? No    Dental Screening: Recommended annual dental exams for proper oral hygiene  Community Resource Referral / Chronic Care Management: CRR required this visit?  No   CCM required this visit?  No   Plan:    I have personally reviewed and noted the following in the patient's chart:   Medical and social history Use of alcohol, tobacco or illicit drugs  Current medications and supplements including opioid prescriptions. Patient is not currently taking opioid prescriptions. Functional ability and status Nutritional status Physical activity Advanced directives List of other  physicians Hospitalizations, surgeries, and ER visits in previous 12 months Vitals Screenings to include cognitive, depression, and falls Referrals and appointments  In addition, I have reviewed and discussed with patient certain preventive protocols, quality metrics, and best practice recommendations. A written personalized care plan for preventive services as well as general preventive health recommendations were provided to patient.   Angeline Fredericks, LPN   [id number]   After Visit Summary: (MyChart) Due to this being a telephonic visit, the after visit summary with patients personalized plan was offered to patient via MyChart   Notes: Nothing significant to report at this time.

## 2024-04-22 NOTE — Patient Instructions (Signed)
 Betty [name] , Thank you for taking time out of your busy schedule to complete your Annual Wellness Visit with me. I enjoyed our conversation and look forward to speaking with you again next year. I, as well as your care team,  appreciate your ongoing commitment to your health goals. Please review the following plan we discussed and let me know if I can assist you in the future. Your Game plan/ To Do List    Referrals: If you haven't heard from the office you've been referred to, please reach out to them at the phone provided.  Remember to update your tetanus, flu and covid vaccines.  Please contact your eye doctor about a diabetic eye exam.  Follow up Visits: We will see or speak with you next year for your Next Medicare AWV with our clinical staff 04/27/25 @ 1:40 Have you seen your provider in the last 6 months (3 months if uncontrolled diabetes)? Yes  Clinician Recommendations:  Aim for 30 minutes of exercise or brisk walking, 6-8 glasses of water, and 5 servings of fruits and vegetables each day.       This is a list of the screenings recommended for you:  Health Maintenance  Topic Date Due   Yearly kidney health urinalysis for diabetes  12/15/2010   Eye exam for diabetics  12/29/2022   Complete foot exam   05/22/2023   DTaP/Tdap/Td vaccine (4 - Td or Tdap) 10/15/2023   COVID-19 Vaccine (9 - Pfizer risk 2024-25 season) 11/29/2023   Flu Shot  03/27/2024   Hemoglobin A1C  05/01/2024   Yearly kidney function blood test for diabetes  10/29/2024   Mammogram  03/17/2025   Colon Cancer Screening  03/22/2025   Medicare Annual Wellness Visit  04/22/2025   Pneumococcal Vaccine for age over 42  Completed   DEXA scan (bone density measurement)  Completed   Hepatitis C Screening  Completed   Zoster (Shingles) Vaccine  Completed   HPV Vaccine  Aged Out   Meningitis B Vaccine  Aged Out    Advanced directives: (ACP Link)Information on Advanced Care Planning can be found at [place]   Secretary of Operating Room Services Advance Health Care Directives Advance Health Care Directives. [email or web address]  Advance Care Planning is important because it:  [x]  Makes sure you receive the medical care that is consistent with your values, goals, and preferences  [x]  It provides guidance to your family and loved ones and reduces their decisional burden about whether or not they are making the right decisions based on your wishes.  Follow the link provided in your after visit summary or read over the paperwork we have mailed to you to help you started getting your Advance Directives in place. If you need assistance in completing these, please reach out to us  so that we can help you!

## 2024-04-23 ENCOUNTER — Encounter: Payer: Self-pay | Admitting: Nurse Practitioner

## 2024-05-07 ENCOUNTER — Other Ambulatory Visit: Payer: Self-pay | Admitting: Nurse Practitioner

## 2024-05-07 ENCOUNTER — Other Ambulatory Visit: Payer: Self-pay

## 2024-05-07 DIAGNOSIS — Z23 Encounter for immunization: Secondary | ICD-10-CM

## 2024-05-07 MED ORDER — COVID-19 MRNA VAC-TRIS(PFIZER) 30 MCG/0.3ML IM SUSY: 0.3000 mL | PREFILLED_SYRINGE | Freq: Once | INTRAMUSCULAR | 0 refills | Status: DC

## 2024-05-07 MED ORDER — COVID-19 MRNA VACC (MODERNA) 50 MCG/0.5ML IM SUSY: 0.5000 mL | PREFILLED_SYRINGE | Freq: Once | INTRAMUSCULAR | 0 refills | Status: AC

## 2024-05-14 ENCOUNTER — Ambulatory Visit: Attending: Cardiology | Admitting: Cardiology

## 2024-05-14 ENCOUNTER — Encounter: Payer: Self-pay | Admitting: Cardiology

## 2024-05-14 VITALS — BP 122/62 | HR 100 | Ht 65.0 in | Wt 202.0 lb

## 2024-05-14 DIAGNOSIS — E782 Mixed hyperlipidemia: Secondary | ICD-10-CM

## 2024-05-14 DIAGNOSIS — I428 Other cardiomyopathies: Secondary | ICD-10-CM | POA: Diagnosis not present

## 2024-05-14 DIAGNOSIS — I10 Essential (primary) hypertension: Secondary | ICD-10-CM | POA: Diagnosis not present

## 2024-05-14 MED ORDER — FUROSEMIDE 40 MG PO TABS: 40.0000 mg | ORAL_TABLET | ORAL | 3 refills | Status: AC

## 2024-05-14 NOTE — Progress Notes (Signed)
 Cardiology Office Note:    Date:  05/14/2024   ID:  [name], Betty [birth year], MRN [id number]  PCP:  Gretel App, NP   [facility] Providers Cardiologist:  Redell Cave, MD Sleep Medicine:  Wilbert Bihari, MD     Referring MD: Gretel App, NP   Chief Complaint  Patient presents with   Follow-up    6  month follow up pt has been doing well with no complaints of chest pain, chest pressure or SOB, medciation reviewed verbally with patient    History of Present Illness:    Betty [name] is a 78 y.o. female with a hx of NICM (initial EF 25%--->normalized 55-60%), HFpEF, LHC 2013 nl coronary arteries, OSA on CPAP, ILD, chronic hypoxemic respiratory failure on O2 nightly who presents for follow-up.    Previously seen due to leg edema, Lasix  20 mg daily prescribed but patient only takes Lasix  2-3 times weekly due to inconvenience when running errands.  Still has edema, endorses liking salty foods, chips.  Denies chest pain or shortness of breath.  Takes Pravachol  every other day, did not tolerate Crestor  in the past, Pravachol  DJD causes back pains.   Prior notes/studies Echo 9/24 EF 55 to 60% Lower extremity ultrasound venous 9/24 no DVT. Echo 2019 EF 55 to 60% Left heart cath 2013 normal coronary arteries  Past Medical History:  Diagnosis Date   Allergic rhinitis    never tested, fall and spring   Arthritis    hands,knees, feet   Cataracts, bilateral    not surgical yet   CHOLECYSTECTOMY, HX OF 10/08/2007   Qualifier: Diagnosis of  By: Bartley MD, Lamar Mulch    Chronic diastolic CHF (congestive heart failure) (HCC)    Chronic respiratory failure (HCC)    Diabetes mellitus without complication (HCC)    Diverticulosis    problems with frequent gas, burping   Glucose intolerance (impaired glucose tolerance)    Gout    History of chicken pox    Hyperlipidemia    Hypertension    NICM (nonischemic cardiomyopathy) (HCC) 2002   EF 25%; improved to  normal - echo 4/08: EF 50-55%, mild MR, mild LAE, mild TR;    cath 3/03: normal cors, EF 40%   Obesity    Oxygen  deficiency 2017   at night   PONV (postoperative nausea and vomiting)    after wisdom tooth extraction   Restrictive lung disease    Skin cancer    skin cancers only   Sleep apnea    cpap settings 4    Past Surgical History:  Procedure Laterality Date   BILATERAL VATS ABLATION Right    biopsy done 2015- [facility]   BREAST EXCISIONAL BIOPSY Left 80's   NEG   CARDIAC CATHETERIZATION     CATARACT EXTRACTION W/PHACO Right 06/21/2020   Procedure: CATARACT EXTRACTION PHACO AND INTRAOCULAR LENS PLACEMENT (IOC) RIGHT DIABETIC;  Surgeon: Jaye Fallow, MD;  Location: [facility];  Service: Ophthalmology;  Laterality: Right;  4.06 0:32.8   CATARACT EXTRACTION W/PHACO Left 07/12/2020   Procedure: CATARACT EXTRACTION PHACO AND INTRAOCULAR LENS PLACEMENT (IOC) LEFT DIABETIC 6.96 00:46.3;  Surgeon: Jaye Fallow, MD;  Location: [facility];  Service: Ophthalmology;  Laterality: Left;  Diabetic - oral meds   CHOLECYSTECTOMY     choleystectomy  02/2002   COLONOSCOPY WITH PROPOFOL  N/A 12/20/2014   Procedure: COLONOSCOPY WITH PROPOFOL ;  Surgeon: Gordy CHRISTELLA Starch, MD;  Location: [facility];  Service: Gastroenterology;  Laterality: N/A;  COLONOSCOPY WITH PROPOFOL  N/A 03/22/2020   Procedure: COLONOSCOPY WITH PROPOFOL ;  Surgeon: Albertus Gordy HERO, MD;  Location: [facility];  Service: Gastroenterology;  Laterality: N/A;   CYSTECTOMY  1996   l breast   DILATION AND CURETTAGE OF UTERUS  2011   Dr. Starla at [facility]   INDUCED ABORTION     nsvd     x 2   POLYPECTOMY  03/22/2020   Procedure: POLYPECTOMY;  Surgeon: Albertus Gordy HERO, MD;  Location: [facility];  Service: Gastroenterology;;   TUBAL LIGATION  1975   VAGINAL DELIVERY     x2    Current Medications: Current Meds  Medication Sig   acetaminophen  (TYLENOL ) 500 MG tablet Take 500 mg by mouth every 8 (eight) hours  as needed.   aspirin  81 MG EC tablet Take 1 tablet (81 mg total) by mouth daily.   CALCIUM -MAGNESIUM-VITAMIN D PO Take 1 tablet by mouth daily.   carvedilol  (COREG ) 12.5 MG tablet TAKE 1 TABLET (12.5MG  TOTAL) BY MOUTH TWICE A DAY WITH MEALS   clobetasol  ointment (TEMOVATE ) 0.05 % Place 0.25 Applicator vaginally once weekly.   Coenzyme Q10 (CO Q 10 PO) Take 200 mg by mouth at bedtime.    Colchicine  (MITIGARE ) 0.6 MG CAPS Day 1: take 1.2 mg (2 tablets) by mouth at the first sign of flare, followed by 0.6 mg (1 tablet) by mouth after 1 hour. Day 2 and thereafter: take 0.6 mg (1 tablet) by mouth daily until flare has resolved.   Dulaglutide  (TRULICITY ) 0.75 MG/0.5ML SOAJ Inject 0.75 mg into the skin once a week.   estradiol  (ESTRACE ) 0.1 MG/GM vaginal cream Place 0.25 Applicatorfuls vaginally 3 (three) times a week.   ezetimibe  (ZETIA ) 10 MG tablet TAKE 1 TABLET BY MOUTH EVERY DAY   Fish Oil OIL Take 1,200 mg by mouth 2 (two) times daily. GUMMIES   fluticasone  (FLONASE ) 50 MCG/ACT nasal spray SPRAY 2 SPRAYS INTO EACH NOSTRIL EVERY DAY   losartan  (COZAAR ) 50 MG tablet Take 0.5 tablets (25 mg total) by mouth daily.   multivitamin (THERAGRAN) per tablet Take 1 tablet by mouth daily.   OXYGEN  Inhale 3 L into the lungs at bedtime.   polyvinyl alcohol (LIQUIFILM TEARS) 1.4 % ophthalmic solution Place 1 drop into both eyes every morning.   pravastatin  (PRAVACHOL ) 40 MG tablet TAKE 1 TABLET BY MOUTH EVERY DAY (Patient taking differently: Take 40 mg by mouth every other day.)   [DISCONTINUED] furosemide  (LASIX ) 20 MG tablet TAKE 1 TABLET BY MOUTH EVERY DAY (Patient taking differently: Take 20 mg by mouth daily. As needed)     Allergies:   Amoxicillin, Etodolac, Hydrochlorothiazide , Meloxicam, Sulfa antibiotics, Allopurinol , Metformin  and related, Rosuvastatin , Sulfonamide derivatives, Jardiance  [empagliflozin ], and Lasix  [furosemide ]   Social History   Socioeconomic History   Marital status: Married     Spouse name: Not on file   Number of children: 2   Years of education: Not on file   Highest education level: Bachelor's degree (e.g., BA, AB, BS)  Occupational History   Occupation: Retired Magazine features editor: retired   Occupation: Works at [employer]  Tobacco Use   Smoking status: Never   Smokeless tobacco: Never  Vaping Use   Vaping status: Never Used  Substance and Sexual Activity   Alcohol use: No    Alcohol/week: 0.0 standard drinks of alcohol   Drug use: No   Sexual activity: Not Currently    Birth control/protection: Post-menopausal, Surgical  Other Topics Concern  Not on file  Social History Narrative   Lives in [place] with husband. Worked at Runner, broadcasting/film/video at [employer]. 2 children. No pets.   Social Drivers of Corporate investment banker Strain: Low Risk  (04/21/2024)   Overall Financial Resource Strain (CARDIA)    Difficulty of Paying Living Expenses: Not hard at all  Food Insecurity: No Food Insecurity (04/21/2024)   Hunger Vital Sign    Worried About Running Out of Food in the Last Year: Never true    Ran Out of Food in the Last Year: Never true  Transportation Needs: No Transportation Needs (04/21/2024)   PRAPARE - Administrator, Civil Service (Medical): No    Lack of Transportation (Non-Medical): No  Physical Activity: Inactive (04/21/2024)   Exercise Vital Sign    Days of Exercise per Week: 0 days    Minutes of Exercise per Session: 0 min  Stress: No Stress Concern Present (04/21/2024)   [employer] of Occupational Health - Occupational Stress Questionnaire    Feeling of Stress: Not at all  Social Connections: Socially Integrated (04/21/2024)   Social Connection and Isolation Panel    Frequency of Communication with Friends and Family: More than three times a week    Frequency of Social Gatherings with Friends and Family: More than three times a week    Attends Religious Services: More than 4 times per year    Active Member of  [employer] or Organizations: Yes    Attends Banker Meetings: 1 to 4 times per year    Marital Status: Married     Family History: The patient's family history includes Bladder Cancer in her maternal grandmother; COPD in her father; Depression in her sister; Diabetes in her father, paternal grandfather, and sister; Heart attack in her father; Heart disease in her father and paternal grandfather; Hyperlipidemia in her father; Hypertension in her brother and father; Leukemia in her maternal grandfather; Liver disease in her sister; Lymphoma (age of onset: 75) in her mother; Meniere's disease in her brother; Pneumonia in her father; Schizophrenia in her daughter; Stroke in her father. There is no history of Colon cancer, Stomach cancer, Breast cancer, Colon polyps, Esophageal cancer, or Rectal cancer.  ROS:   Please see the history of present illness.     All other systems reviewed and are negative.  EKGs/Labs/Other Studies Reviewed:    The following studies were reviewed today:  EKG Interpretation Date/Time:  Thursday May 14 2024 11:19:30 EDT Ventricular Rate:  100 PR Interval:  192 QRS Duration:  100 QT Interval:  342 QTC Calculation: 441 R Axis:   -60  Text Interpretation: Normal sinus rhythm Left anterior fascicular block Minimal voltage criteria for LVH, may be normal variant ( Cornell product ) Confirmed by Darliss Rogue ([id number]) on 05/14/2024 11:30:24 AM  Recent Labs: 10/30/2023: ALT 22; BUN 12; Creatinine, Ser 0.56; Hemoglobin 14.9; Platelets 217.0; Potassium 4.1; Sodium 140; TSH 3.75  Recent Lipid Panel    Component Value Date/Time   CHOL 153 10/30/2023 1435   TRIG 236.0 (H) 10/30/2023 1435   HDL 62.40 10/30/2023 1435   CHOLHDL 2 10/30/2023 1435   VLDL 47.2 (H) 10/30/2023 1435   LDLCALC 44 10/30/2023 1435   LDLDIRECT 58.0 11/19/2022 1049     Risk Assessment/Calculations:        Physical Exam:    VS:  BP 122/62 (BP Location: Left Arm, Patient  Position: Sitting)   Pulse 100   Ht 5' 5 (1.651 m)  Wt 202 lb (91.6 kg)   SpO2 (!) 84%   BMI 33.61 kg/m     Wt Readings from Last 3 Encounters:  05/14/24 202 lb (91.6 kg)  04/22/24 200 lb (90.7 kg)  03/10/24 201 lb 3.2 oz (91.3 kg)     GEN:  Well nourished, well developed in no acute distress HEENT: Normal NECK: No JVD; No carotid bruits CARDIAC: RRR, no murmurs, rubs, gallops RESPIRATORY: Diminished breath sounds, no wheezing. ABDOMEN: Soft, non-tender, non-distended MUSCULOSKELETAL:  1-2+ edema; worse on left.  No deformity  SKIN: Warm and dry NEUROLOGIC:  Alert and oriented x 3 PSYCHIATRIC:  Normal affect   ASSESSMENT:    1. NICM (nonischemic cardiomyopathy) (HCC)   2. Mixed hyperlipidemia   3. Primary hypertension    PLAN:    In order of problems listed above:  Nonischemic cardiomyopathy, initial EF 25%, last  Echo 9/24 EF 55 to 60%.  Leg edema improved but still present, advised to take Lasix  as 40 mg 3 times weekly.  Low-salt diet also emphasized.  Continue Coreg  12.5 mg twice daily, losartan  25 mg daily. Hyperlipidemia, continue Pravachol  40 mg every other day, Zetia  10 mg daily. Hypertension, BP controlled.  Continue Coreg  12.5 mg twice daily, losartan  25 mg daily.    Follow-up in 6 months.    Medication Adjustments/Labs and Tests Ordered: Current medicines are reviewed at length with the patient today.  Concerns regarding medicines are outlined above.  Orders Placed This Encounter  Procedures   EKG 12-Lead   Meds ordered this encounter  Medications   furosemide  (LASIX ) 40 MG tablet    Sig: Take 1 tablet (40 mg total) by mouth 3 (three) times a week.    Dispense:  30 tablet    Refill:  3    Patient Instructions  Medication Instructions:  - INCREASE lasix  to 40 mg three times a week  *If you need a refill on your cardiac medications before your next appointment, please call your pharmacy*  Lab Work: No labs ordered today  If you have labs (blood  work) drawn today and your tests are completely normal, you will receive your results only by: MyChart Message (if you have MyChart) OR A paper copy in the mail If you have any lab test that is abnormal or we need to change your treatment, we will call you to review the results.  Testing/Procedures: No test ordered today   Follow-Up: At [facility], you and your health needs are our priority.  As part of our continuing mission to provide you with exceptional heart care, our providers are all part of one team.  This team includes your primary Cardiologist (physician) and Advanced Practice Providers or APPs (Physician Assistants and Nurse Practitioners) who all work together to provide you with the care you need, when you need it.  Your next appointment:   6 month(s)  Provider:   You may see Redell Cave, MD or one of the following Advanced Practice Providers on your designated Care Team:   Lonni Meager, NP Lesley Maffucci, PA-C Bernardino Bring, PA-C Cadence [place] [facility], PA-C Tylene Lunch, NP Barnie Hila, NP    We recommend signing up for the patient portal called MyChart.  Sign up information is provided on this After Visit Summary.  MyChart is used to connect with patients for Virtual Visits (Telemedicine).  Patients are able to view lab/test results, encounter notes, upcoming appointments, etc.  Non-urgent messages can be sent to your provider as well.   To  learn more about what you can do with MyChart, go to [email or web address].         Signed, Redell Cave, MD  05/14/2024 12:05 PM    [facility]

## 2024-05-14 NOTE — Patient Instructions (Signed)
 Medication Instructions:  - INCREASE lasix  to 40 mg three times a week  *If you need a refill on your cardiac medications before your next appointment, please call your pharmacy*  Lab Work: No labs ordered today  If you have labs (blood work) drawn today and your tests are completely normal, you will receive your results only by: MyChart Message (if you have MyChart) OR A paper copy in the mail If you have any lab test that is abnormal or we need to change your treatment, we will call you to review the results.  Testing/Procedures: No test ordered today   Follow-Up: At [facility], you and your health needs are our priority.  As part of our continuing mission to provide you with exceptional heart care, our providers are all part of one team.  This team includes your primary Cardiologist (physician) and Advanced Practice Providers or APPs (Physician Assistants and Nurse Practitioners) who all work together to provide you with the care you need, when you need it.  Your next appointment:   6 month(s)  Provider:   You may see Redell Cave, MD or one of the following Advanced Practice Providers on your designated Care Team:   Lonni Meager, NP Lesley Maffucci, PA-C Bernardino Bring, PA-C Cadence [place], PA-C Tylene Lunch, NP Barnie Hila, NP    We recommend signing up for the patient portal called MyChart.  Sign up information is provided on this After Visit Summary.  MyChart is used to connect with patients for Virtual Visits (Telemedicine).  Patients are able to view lab/test results, encounter notes, upcoming appointments, etc.  Non-urgent messages can be sent to your provider as well.   To learn more about what you can do with MyChart, go to [email or web address].

## 2024-05-21 DIAGNOSIS — D2271 Melanocytic nevi of right lower limb, including hip: Secondary | ICD-10-CM | POA: Diagnosis not present

## 2024-05-21 DIAGNOSIS — Z85828 Personal history of other malignant neoplasm of skin: Secondary | ICD-10-CM | POA: Diagnosis not present

## 2024-05-21 DIAGNOSIS — D2261 Melanocytic nevi of right upper limb, including shoulder: Secondary | ICD-10-CM | POA: Diagnosis not present

## 2024-05-21 DIAGNOSIS — L821 Other seborrheic keratosis: Secondary | ICD-10-CM | POA: Diagnosis not present

## 2024-05-21 DIAGNOSIS — L570 Actinic keratosis: Secondary | ICD-10-CM | POA: Diagnosis not present

## 2024-05-21 DIAGNOSIS — D2272 Melanocytic nevi of left lower limb, including hip: Secondary | ICD-10-CM | POA: Diagnosis not present

## 2024-05-21 DIAGNOSIS — D225 Melanocytic nevi of trunk: Secondary | ICD-10-CM | POA: Diagnosis not present

## 2024-05-21 DIAGNOSIS — D2262 Melanocytic nevi of left upper limb, including shoulder: Secondary | ICD-10-CM | POA: Diagnosis not present

## 2024-05-21 DIAGNOSIS — Z08 Encounter for follow-up examination after completed treatment for malignant neoplasm: Secondary | ICD-10-CM | POA: Diagnosis not present

## 2024-06-04 ENCOUNTER — Ambulatory Visit (INDEPENDENT_AMBULATORY_CARE_PROVIDER_SITE_OTHER)

## 2024-06-04 ENCOUNTER — Encounter: Admitting: Nurse Practitioner

## 2024-06-04 ENCOUNTER — Ambulatory Visit: Payer: Self-pay

## 2024-06-04 VITALS — BP 100/80 | HR 87 | Temp 97.8°F | Ht 65.0 in | Wt 203.0 lb

## 2024-06-04 DIAGNOSIS — L84 Corns and callosities: Secondary | ICD-10-CM

## 2024-06-04 DIAGNOSIS — Z7985 Long-term (current) use of injectable non-insulin antidiabetic drugs: Secondary | ICD-10-CM | POA: Diagnosis not present

## 2024-06-04 DIAGNOSIS — Z0001 Encounter for general adult medical examination with abnormal findings: Secondary | ICD-10-CM

## 2024-06-04 DIAGNOSIS — E1121 Type 2 diabetes mellitus with diabetic nephropathy: Secondary | ICD-10-CM | POA: Diagnosis not present

## 2024-06-04 DIAGNOSIS — E782 Mixed hyperlipidemia: Secondary | ICD-10-CM

## 2024-06-04 DIAGNOSIS — R2689 Other abnormalities of gait and mobility: Secondary | ICD-10-CM

## 2024-06-04 DIAGNOSIS — I872 Venous insufficiency (chronic) (peripheral): Secondary | ICD-10-CM | POA: Diagnosis not present

## 2024-06-04 LAB — HEMOGLOBIN A1C: Hgb A1c MFr Bld: 7.3 % — ABNORMAL HIGH (ref 4.6–6.5)

## 2024-06-04 LAB — MICROALBUMIN / CREATININE URINE RATIO
Creatinine,U: 30.3 mg/dL
Microalb Creat Ratio: UNDETERMINED mg/g (ref 0.0–30.0)
Microalb, Ur: <0.7 mg/dL

## 2024-06-04 MED ORDER — LOSARTAN POTASSIUM 50 MG PO TABS: 25.0000 mg | ORAL_TABLET | Freq: Every day | ORAL | 3 refills | Status: AC

## 2024-06-04 MED ORDER — TRULICITY 0.75 MG/0.5ML ~~LOC~~ SOAJ
0.7500 mg | SUBCUTANEOUS | 1 refills | Status: AC
Start: 2024-06-04 — End: ?

## 2024-06-04 MED ORDER — PRAVASTATIN SODIUM 40 MG PO TABS: ORAL_TABLET | ORAL | 3 refills | Status: AC

## 2024-06-04 MED ORDER — PRAVASTATIN SODIUM 40 MG PO TABS: 40.0000 mg | ORAL_TABLET | Freq: Every day | ORAL | 3 refills | Status: DC

## 2024-06-04 MED ORDER — EZETIMIBE 10 MG PO TABS: 10.0000 mg | ORAL_TABLET | Freq: Every day | ORAL | 1 refills | Status: AC

## 2024-06-04 NOTE — Assessment & Plan Note (Signed)
 Chronic foot calluses and dry skin with decreased sensation in some areas of the feet on monofilament test.  Refer to podiatrist for foot care.

## 2024-06-04 NOTE — Assessment & Plan Note (Signed)
 Chronic lower extremity edema, more pronounced in left lower legs. She uses Lasix  40 mg every other day constraints (needing to pee) as recommended by cardiologist. Recommend evaluation if edema worsens from baseline, pain, shortness of breath. Only wearing compression socks in winter, encourage wearing it daily.

## 2024-06-04 NOTE — Patient Instructions (Signed)
 Referral to podiatrist and physical therapy was done today. You will get a phone call to schedule an appointment.  Once I have the lab results I will update you on that.  I recommend follow up with PCP in about 2 months for reevaluation into balance concerns.

## 2024-06-04 NOTE — Assessment & Plan Note (Signed)
 Physical exam completed.  Encouraged healthy diet.  Discussed reducing carbohydrate rich diet. DEXA, breast cancer screening UDT.  Has eye exam at [facility] annually, most recent visit was earlier this year, will get documentation from he last eye exam.

## 2024-06-04 NOTE — Progress Notes (Signed)
 Annual Physical Exam    Subjective  Patient ID: Betty [name], female    DOB: [birth year]  Age: 78 y.o. MRN: [id number]  Chief Complaint  Patient presents with   Annual Exam    She  has a past medical history of Allergic rhinitis, Arthritis, Cataracts, bilateral, CHOLECYSTECTOMY, HX OF (10/08/2007), Chronic diastolic CHF (congestive heart failure) (HCC), Chronic respiratory failure (HCC), Diabetes mellitus without complication (HCC), Diverticulosis, Glucose intolerance (impaired glucose tolerance), Gout, History of chicken pox, Hyperlipidemia, Hypertension, NICM (nonischemic cardiomyopathy) (HCC) (2002), Obesity, Oxygen  deficiency (2017), PONV (postoperative nausea and vomiting), Restrictive lung disease, Skin cancer, and Sleep apnea.  HPI Discussed the use of AI scribe software for clinical note transcription with the patient, who gave verbal consent to proceed.  History of Present Illness Betty [name] is a 78 year old female with diabetes and neuropathy who presents for an annual physical exam. She is accompanied by her husband, Betty [name].  She is up to date on diabetic eye exam. Breast cancer screening, DEXA UDT at well.   She has a history of diabetes and is currently on Trulicity  0.75 mg, administered on Monday nights, with no nausea or vomiting from the medication. Her last A1c was 7.2% in March, and she is due for a repeat test. She has diabetic neuropathy in her feet for over five years. She wears protective footwear at home and when out to prevent skin ulcers.   She experiences balance issues, which have worsened over the past year, but has not had any falls. She has not undergone physical therapy for balance issues.   She takes pravastatin  every other day due to previous issues with statins causing joint pain. This regimen is manageable, although she experiences some backaches.    ROS As per HPI    Objective:     BP 100/80 (BP Location: Right Arm, Patient Position:  Sitting, Cuff Size: Normal)   Pulse 87   Temp 97.8 F (36.6 C) (Oral)   Ht 5' 5 (1.651 m)   Wt 203 lb (92.1 kg)   SpO2 (!) 84%   BMI 33.78 kg/m      06/04/2024   11:37 AM 04/22/2024    9:35 AM 10/30/2023    2:14 PM  Depression screen PHQ 2/9  Decreased Interest 1 0 1  Down, Depressed, Hopeless 1 0 1  PHQ - 2 Score 2 0 2  Altered sleeping 3 3 2   Tired, decreased energy 3 3 2   Change in appetite 1 1 1   Feeling bad or failure about yourself  0 0 0  Trouble concentrating 1 0 1  Moving slowly or fidgety/restless 0 0 0  Suicidal thoughts 0 0 0  PHQ-9 Score 10 7 8   Difficult doing work/chores Somewhat difficult Somewhat difficult Somewhat difficult      06/04/2024   11:37 AM 10/30/2023    2:14 PM 05/27/2023   10:50 AM 11/19/2022   10:35 AM  GAD 7 : Generalized Anxiety Score  Nervous, Anxious, on Edge 0 0 0 0  Control/stop worrying 0 0 0 0  Worry too much - different things 0 0 0 0  Trouble relaxing 0 0 0 0  Restless 0 0 0 0  Easily annoyed or irritable 0 0 0 0  Afraid - awful might happen 0 0 0 0  Total GAD 7 Score 0 0 0 0  Anxiety Difficulty Somewhat difficult Not difficult at all Not difficult at all Not difficult at all  06/04/2024   11:37 AM 04/22/2024    9:35 AM 10/30/2023    2:14 PM  Depression screen PHQ 2/9  Decreased Interest 1 0 1  Down, Depressed, Hopeless 1 0 1  PHQ - 2 Score 2 0 2  Altered sleeping 3 3 2   Tired, decreased energy 3 3 2   Change in appetite 1 1 1   Feeling bad or failure about yourself  0 0 0  Trouble concentrating 1 0 1  Moving slowly or fidgety/restless 0 0 0  Suicidal thoughts 0 0 0  PHQ-9 Score 10 7 8   Difficult doing work/chores Somewhat difficult Somewhat difficult Somewhat difficult      06/04/2024   11:37 AM 10/30/2023    2:14 PM 05/27/2023   10:50 AM 11/19/2022   10:35 AM  GAD 7 : Generalized Anxiety Score  Nervous, Anxious, on Edge 0 0 0 0  Control/stop worrying 0 0 0 0  Worry too much - different things 0 0 0 0  Trouble  relaxing 0 0 0 0  Restless 0 0 0 0  Easily annoyed or irritable 0 0 0 0  Afraid - awful might happen 0 0 0 0  Total GAD 7 Score 0 0 0 0  Anxiety Difficulty Somewhat difficult Not difficult at all Not difficult at all Not difficult at all   SDOH Screenings   Food Insecurity: No Food Insecurity (04/21/2024)  Housing: Low Risk  (04/21/2024)  Transportation Needs: No Transportation Needs (04/21/2024)  Utilities: Not At Risk (04/22/2024)  Alcohol Screen: Low Risk  (04/21/2024)  Depression (PHQ2-9): Medium Risk (06/04/2024)  Financial Resource Strain: Low Risk  (04/21/2024)  Physical Activity: Inactive (04/21/2024)  Social Connections: Socially Integrated (04/21/2024)  Stress: No Stress Concern Present (04/21/2024)  Tobacco Use: Low Risk  (06/04/2024)  Health Literacy: Adequate Health Literacy (04/22/2024)     Physical Exam Constitutional:      Appearance: She is obese.  HENT:     Head: Normocephalic and atraumatic.     Right Ear: Tympanic membrane normal.     Left Ear: Tympanic membrane normal.  Cardiovascular:     Rate and Rhythm: Normal rate.  Pulmonary:     Effort: Pulmonary effort is normal.     Breath sounds: Normal breath sounds.  Abdominal:     General: Abdomen is protuberant.     Tenderness: There is no guarding.  Musculoskeletal:     Cervical back: Neck supple.     Comments: Mild b/l pitting edema with left slightly worse than right, chronic in nature. No pain on palpation of b/l calf.   Neurological:     Mental Status: She is alert and oriented to person, place, and time.     Gait: Gait abnormal (slow, careful gait).  Psychiatric:        Mood and Affect: Mood normal.        No results found for any visits on 06/04/24.  The 10-year ASCVD risk score (Arnett DK, et al., 2019) is: 29.7%     Assessment & Plan:   Encounter for general adult medical examination with abnormal findings Assessment & Plan: Physical exam completed.  Encouraged healthy diet.  Discussed  reducing carbohydrate rich diet. DEXA, breast cancer screening UDT.  Has eye exam at [facility] annually, most recent visit was earlier this year, will get documentation from he last eye exam.    Mixed hyperlipidemia -     Ezetimibe ; Take 1 tablet (10 mg total) by mouth daily.  Dispense: 90  tablet; Refill: 1 -     Pravastatin  Sodium; Take it every other day  Dispense: 90 tablet; Refill: 3  Controlled type 2 diabetes mellitus with diabetic nephropathy, without long-term current use of insulin (HCC) Assessment & Plan: Diabetic foot exam: callus b/l under first metatarsal, monofilament test with multiple sites with reduced sensation. Podiatry referral made today.  May be contributing to balance concerns, PT referral made today.  Will check urine microalbumin, A1c today.  Continue Trulicity  0/75 mg weekly, tolerating well. Continue Pravastatin  40 mg every other day. On Losartan  25 mg daily, BP within goal, continue.   Orders: -     Trulicity ; Inject 0.75 mg into the skin once a week.  Dispense: 6 mL; Refill: 1 -     Losartan  Potassium; Take 0.5 tablets (25 mg total) by mouth daily.  Dispense: 45 tablet; Refill: 3 -     Hemoglobin A1c -     Microalbumin / creatinine urine ratio -     Ambulatory referral to Podiatry  Callus Assessment & Plan: Chronic foot calluses and dry skin with decreased sensation in some areas of the feet on monofilament test.  Refer to podiatrist for foot care.   Orders: -     Ambulatory referral to Podiatry  Balance problem Assessment & Plan: Balance impairment worsening over the past year. No falls reported.Refer to physical therapy for strengthening exercise, reduce risk of fall and balance improvement.  Follow up with PCP in two months to evaluate balance improvement.  Orders: -     Ambulatory referral to Physical Therapy  Venous insufficiency Assessment & Plan: Chronic lower extremity edema, more pronounced in left lower legs. She uses Lasix  40 mg  every other day constraints (needing to pee) as recommended by cardiologist. Recommend evaluation if edema worsens from baseline, pain, shortness of breath. Only wearing compression socks in winter, encourage wearing it daily.      Return in about 2 months (around 08/04/2024) for With PCP for medication check, chronic f/u.   Luke Shade, MD

## 2024-06-04 NOTE — Assessment & Plan Note (Signed)
 Balance impairment worsening over the past year. No falls reported.Refer to physical therapy for strengthening exercise, reduce risk of fall and balance improvement.  Follow up with PCP in two months to evaluate balance improvement.

## 2024-06-04 NOTE — Assessment & Plan Note (Signed)
 Diabetic foot exam: callus b/l under first metatarsal, monofilament test with multiple sites with reduced sensation. Podiatry referral made today.  May be contributing to balance concerns, PT referral made today.  Will check urine microalbumin, A1c today.  Continue Trulicity  0/75 mg weekly, tolerating well. Continue Pravastatin  40 mg every other day. On Losartan  25 mg daily, BP within goal, continue.

## 2024-06-05 ENCOUNTER — Ambulatory Visit: Payer: Self-pay | Admitting: Emergency Medicine

## 2024-06-05 NOTE — Telephone Encounter (Signed)
 FYI Only or Action Required?: FYI only for provider.  Patient is followed in Pulmonology for sleep apnea and lung disease, last seen on 11/02/2022 by Betty Lamar RAMAN, MD.  Called Nurse Triage reporting Shortness of Breath.  Symptoms began about a month ago.  Interventions attempted: Nothing.  Symptoms are: stable.  Triage Disposition: See Within 3 Days in Office (overriding See HCP Within 4 Hours (Or PCP Triage))  Patient/caregiver understands and will follow disposition?: Yes                             Copied from CRM [id number]. Topic: Clinical - Red Word Triage >> Jun 05, 2024  4:30 PM Betty [name] wrote: Red Word that prompted transfer to Nurse Triage: Pt had a visit with PCP NP Leron Glance on 06/04/24. She was told to see Dr. Shelah ASAP due to her breathing issues worsening.  Pt has SOB, difficulty breathing, an oxygen  saturation of 84. Pt would like to be seen as soon as possible. Reason for Disposition  [1] MILD difficulty breathing (e.g., minimal/no SOB at rest, SOB with walking, pulse < 100) AND [2] NEW-onset or WORSE than normal  Answer Assessment - Initial Assessment Questions E2C2 Pulmonary Triage - Initial Assessment Questions  1. Chief Complaint (e.g., cough, sob, wheezing, fever, chills, sweat or additional symptoms) *Go to specific symptom protocol after initial questions. Worsening SOB and low pulse oximeter reading at recent PCP appointment  2. Have you used your inhalers/maintenance medication? If yes, What medications? Denies  3. Do you wear supplemental oxygen ? If yes, How many liters are you supposed to use? States she is supposed to use oxygen  during the day, but does not comply, states she always uses oxygen  at night   4. Do you monitor your oxygen  levels? If yes, What is your reading (oxygen  level) today? 85% while on phone with this RN, states she has a manicure, so reading may not be accurate    5. What is your usual  oxygen  saturation reading?  (Note: Pulmonary O2 sats should be 90% or greater) Baseline is 90%     1. RESPIRATORY STATUS: Describe your breathing? (e.g., wheezing, shortness of breath, unable to speak, severe coughing)      Mild SOB, patient is able to speak in clear and complete sentences while on phone with this RN, no labored breathing detected, no wheezing or coughing detected 2. ONSET: When did this breathing problem begin?      States she has been SOB for years, but SOB has worsened within past month 3. PATTERN Does the difficult breathing come and go, or has it been constant since it started?      Comes upon exertion 4. SEVERITY: How bad is your breathing? (e.g., mild, moderate, severe)      Mild, denies SOB at rest 5. RECURRENT SYMPTOM: Have you had difficulty breathing before? If Yes, ask: When was the last time? and What happened that time?      Chronic 6. CARDIAC HISTORY: Do you have any history of heart disease? (e.g., heart attack, angina, bypass surgery, angioplasty)      CHF 7. LUNG HISTORY: Do you have any history of lung disease?  (e.g., pulmonary embolus, asthma, emphysema)     Sleep apnea and lung disease, per chart 8. CAUSE: What do you think is causing the breathing problem?      Unsure 9. OTHER SYMPTOMS: Do you have any other symptoms? (e.g., chest pain,  cough, dizziness, fever, runny nose)     Nasal drainage for a long time, denies chest pain, occasional wheezing 10. O2 SATURATION MONITOR:  Do you use an oxygen  saturation monitor (pulse oximeter) at home? If Yes, ask: What is your reading (oxygen  level) today? What is your usual oxygen  saturation reading? (e.g., 95%)     85% and HR 102    Patient called in because she was advised to follow-up with her pulmonologist by her PCP. Patient was seen by her PCP on 06/04/24 and O2 was 84%. Patient was able to speak in clear and complete sentences while on phone with this RN. No labored breathing  detected. Patient stated she is supposed to be using oxygen  at home all the time, but does not comply. This RN scheduled first available appointment in office on Monday. This RN encouraged patient to utilize home oxygen . This RN advised patient to go to ED if symptoms worsen over the weekend. Patient verbalized understanding.  Protocols used: Breathing Difficulty-A-AH

## 2024-06-08 ENCOUNTER — Ambulatory Visit: Payer: Self-pay | Admitting: Nurse Practitioner

## 2024-06-08 ENCOUNTER — Ambulatory Visit: Admitting: Nurse Practitioner

## 2024-06-08 ENCOUNTER — Telehealth: Payer: Self-pay | Admitting: Nurse Practitioner

## 2024-06-08 ENCOUNTER — Ambulatory Visit (INDEPENDENT_AMBULATORY_CARE_PROVIDER_SITE_OTHER)

## 2024-06-08 ENCOUNTER — Encounter: Payer: Self-pay | Admitting: Nurse Practitioner

## 2024-06-08 VITALS — BP 102/62 | HR 80 | Temp 98.3°F | Ht 65.0 in | Wt 202.4 lb

## 2024-06-08 DIAGNOSIS — J849 Interstitial pulmonary disease, unspecified: Secondary | ICD-10-CM

## 2024-06-08 DIAGNOSIS — G4733 Obstructive sleep apnea (adult) (pediatric): Secondary | ICD-10-CM | POA: Diagnosis not present

## 2024-06-08 DIAGNOSIS — J984 Other disorders of lung: Secondary | ICD-10-CM | POA: Diagnosis not present

## 2024-06-08 DIAGNOSIS — J9611 Chronic respiratory failure with hypoxia: Secondary | ICD-10-CM

## 2024-06-08 DIAGNOSIS — R0609 Other forms of dyspnea: Secondary | ICD-10-CM

## 2024-06-08 DIAGNOSIS — I5032 Chronic diastolic (congestive) heart failure: Secondary | ICD-10-CM

## 2024-06-08 DIAGNOSIS — I288 Other diseases of pulmonary vessels: Secondary | ICD-10-CM

## 2024-06-08 DIAGNOSIS — R9389 Abnormal findings on diagnostic imaging of other specified body structures: Secondary | ICD-10-CM | POA: Diagnosis not present

## 2024-06-08 DIAGNOSIS — J9621 Acute and chronic respiratory failure with hypoxia: Secondary | ICD-10-CM | POA: Diagnosis not present

## 2024-06-08 DIAGNOSIS — R0989 Other specified symptoms and signs involving the circulatory and respiratory systems: Secondary | ICD-10-CM | POA: Diagnosis not present

## 2024-06-08 DIAGNOSIS — I272 Pulmonary hypertension, unspecified: Secondary | ICD-10-CM | POA: Diagnosis not present

## 2024-06-08 LAB — CBC WITH DIFFERENTIAL/PLATELET
Basophils Absolute: 0 K/uL (ref 0.0–0.1)
Basophils Relative: 0.6 % (ref 0.0–3.0)
Eosinophils Absolute: 0.1 K/uL (ref 0.0–0.7)
Eosinophils Relative: 1.3 % (ref 0.0–5.0)
HCT: 43.2 % (ref 36.0–46.0)
Hemoglobin: 14.5 g/dL (ref 12.0–15.0)
Lymphocytes Relative: 27.2 % (ref 12.0–46.0)
Lymphs Abs: 1.7 K/uL (ref 0.7–4.0)
MCHC: 33.5 g/dL (ref 30.0–36.0)
MCV: 98.6 fl (ref 78.0–100.0)
Monocytes Absolute: 0.7 K/uL (ref 0.1–1.0)
Monocytes Relative: 10.9 % (ref 3.0–12.0)
Neutro Abs: 3.7 K/uL (ref 1.4–7.7)
Neutrophils Relative %: 60 % (ref 43.0–77.0)
Platelets: 202 K/uL (ref 150.0–400.0)
RBC: 4.39 Mil/uL (ref 3.87–5.11)
RDW: 12.8 % (ref 11.5–15.5)
WBC: 6.2 K/uL (ref 4.0–10.5)

## 2024-06-08 LAB — BRAIN NATRIURETIC PEPTIDE: Pro B Natriuretic peptide (BNP): 11 pg/mL (ref 0.0–100.0)

## 2024-06-08 LAB — BASIC METABOLIC PANEL WITH GFR
BUN: 14 mg/dL (ref 6–23)
CO2: 35 meq/L — ABNORMAL HIGH (ref 19–32)
Calcium: 9.1 mg/dL (ref 8.4–10.5)
Chloride: 97 meq/L (ref 96–112)
Creatinine, Ser: 0.59 mg/dL (ref 0.40–1.20)
GFR: 86.98 mL/min (ref >60.00)
Glucose, Bld: 165 mg/dL — ABNORMAL HIGH (ref 70–99)
Potassium: 4 meq/L (ref 3.5–5.1)
Sodium: 139 meq/L (ref 135–145)

## 2024-06-08 LAB — D-DIMER, QUANTITATIVE: D-Dimer, Quant: 0.32 ug{FEU}/mL (ref <0.50)

## 2024-06-08 MED ORDER — PREDNISONE 20 MG PO TABS: 40.0000 mg | ORAL_TABLET | Freq: Every day | ORAL | 0 refills | Status: AC

## 2024-06-08 MED ORDER — ALBUTEROL SULFATE HFA 108 (90 BASE) MCG/ACT IN AERS: 2.0000 | INHALATION_SPRAY | Freq: Four times a day (QID) | RESPIRATORY_TRACT | 3 refills | Status: AC | PRN

## 2024-06-08 NOTE — Assessment & Plan Note (Signed)
 No significant PAH on prior exercise RHC. May need to consider repeat echo pending workup and response to therapies.

## 2024-06-08 NOTE — Assessment & Plan Note (Signed)
 See above.

## 2024-06-08 NOTE — Patient Instructions (Addendum)
 Trial your albuterol  inhaler 2 puffs every 6 hours as needed for shortness of breath or wheezing.   You need to be consistent with your oxygen  use when you are doing any form of activity  You need to wear 3 lpm continuous oxygen  when up moving and 2 lpm at rest. We will get you some portable tanks from your medical supply company. When you come back in, we can reassess and see if you can use the Innogen again   Continue to use CPAP every night, minimum of 4-6 hours a night.  Change equipment as directed. Wash your tubing with warm soap and water daily, hang to dry. Wash humidifier portion weekly. Use bottled, distilled water and change daily Be aware of reduced alertness and do not drive or operate heavy machinery if experiencing this or drowsiness.   Prednisone  40 mg daily for 5 days. Take in AM with food  Check labs and chest x ray today  Ordered HRCT chest scan and repeat lung function testing; I may move up the timing of your CT chest scan based on your labs   We may repeat your echocardiogram of your heart  Follow up in 7-10 days with Katie Keatyn Luck,NP then in 6 weeks after PFT and CT scan with Dr. Shelah (1st). If symptoms do not improve or worsen, please contact office for sooner follow up or seek emergency care.

## 2024-06-08 NOTE — Assessment & Plan Note (Signed)
 Continue CPAP nightly. Receives benefit from use. Aware of risks of untreated OSA

## 2024-06-08 NOTE — Telephone Encounter (Signed)
 Since Betty [name] does not have any openings during that time frame, you can schedule the patient with another App.  Betty [name] has an opening on 10/30, you can use that.  Thank you.

## 2024-06-08 NOTE — Progress Notes (Signed)
 Called and spoke to pt - advised of lab results per [employer]. Pt verbalized understanding, NFN.

## 2024-06-08 NOTE — Assessment & Plan Note (Signed)
 No significant edema on exam. Slight dependent edema in LLE; baseline per  her report. See above plan. Follow up with cardiology as scheduled. Continued lasix  as prescribed.

## 2024-06-08 NOTE — Telephone Encounter (Signed)
 Katie, Do you want to be double booked to see this patient?  If not, Tammy has an opening on 10/30.  Ok to schedule with another NP?  please advise.  Thank you.

## 2024-06-08 NOTE — Assessment & Plan Note (Signed)
 Progressive DOE over the last several months. Concern for progression of ILD vs acute process. Will obtain CXR today and lab work including d dimer to rule out blood clot, BNP to assess for cardiac component and CBC to rule out anemia. She has increased oxygen  requirements and unable to maintain saturations on pulsed dosed therapy. Lung exam today is unremarkable. Euvolemic on exam. Will challenge her with prednisone  burst and albuterol  PRN; reassess response. Repeat HRCT chest and PFT ordered. Action plan in place. Strict return/ED precautions.   Patient Instructions  Trial your albuterol  inhaler 2 puffs every 6 hours as needed for shortness of breath or wheezing.   You need to be consistent with your oxygen  use when you are doing any form of activity  You need to wear 3 lpm continuous oxygen  when up moving and 2 lpm at rest. We will get you some portable tanks from your medical supply company. When you come back in, we can reassess and see if you can use the Innogen again   Continue to use CPAP every night, minimum of 4-6 hours a night.  Change equipment as directed. Wash your tubing with warm soap and water daily, hang to dry. Wash humidifier portion weekly. Use bottled, distilled water and change daily Be aware of reduced alertness and do not drive or operate heavy machinery if experiencing this or drowsiness.   Prednisone  40 mg daily for 5 days. Take in AM with food  Check labs and chest x ray today  Ordered HRCT chest scan and repeat lung function testing; I may move up the timing of your CT chest scan based on your labs   We may repeat your echocardiogram of your heart  Follow up in 7-10 days with Katie Rodd Heft,NP then in 6 weeks after PFT and CT scan with Dr. Shelah (1st). If symptoms do not improve or worsen, please contact office for sooner follow up or seek emergency care.

## 2024-06-08 NOTE — Progress Notes (Signed)
 Labs unremarkable. D dimer negative, which means very low likelihood of PE. Will keep the plan to obtain HRCT chest in the next 4 weeks; does not need a sooner CT. Nothing on labs to correlate to worsening dyspnea. Thanks.

## 2024-06-08 NOTE — Progress Notes (Signed)
 CXR without acute process. Stable elevation of right diaphragm and some decrease aeration

## 2024-06-08 NOTE — Telephone Encounter (Signed)
 Can use my 1 pm slot, if available; otherwise, Fine to see other app if I don't have any openings.

## 2024-06-08 NOTE — Assessment & Plan Note (Signed)
 Acute on chronic respiratory failure. See above. Will send urgent order to DME to provide her with portable O2 tanks given she is unable to maintain on POC at this time. Again reviewed risks of untreated hypoxia and importance of consistent O2 therapy. Verbalized understanding. Will reassess at follow up. Goal >88-90%

## 2024-06-08 NOTE — Progress Notes (Signed)
 Called and spoke to pt - advised of CXR results per [facility]. Pt verbalized understanding, NFN.

## 2024-06-08 NOTE — Progress Notes (Signed)
 @Patient  ID: Betty [name], female    DOB: 1945-11-21, 78 y.o.   MRN: [id number]  Chief Complaint  Patient presents with   Acute Visit    Increased SOB over the last 2 months, only with exertion. Pt denies coughing    Referring provider: Gretel App, NP  HPI: 78 year old female, never smoker followed for OSA on CPAP, restrictive lung disease/ILD, chronic respiratory failure.  She is a patient Dr. Shelah and last seen in office 11/02/2022.  Past medical history significant for cardiomyopathy, pulmonary hypertension, CHF, allergic rhinitis, DM2, HLD, obesity.  TEST/EVENTS:  01/07/2020 PFTs: FVC 49, FEV1 55, ratio 86, TLC 88, DLCOcor 96 10/10/2021 CXR 2 view: Diffuse interstitial coarsening is present.  No acute process noted.  11/02/2022: OV with Dr. Shelah. Undergone surgical lung bx that showed mild parabronchial fibrotic change without inflammation with some arteriopathic PAH. Exercise RHC showed some diastolic dysfunction but no exercise related PAH. Does not wear O2 consistently. Slow progression of her dyspnea since last OV. Did not do any maintenance exercise after pulm rehab. Compliant with CPAP.   06/08/2024: Today - acute Discussed the use of AI scribe software for clinical note transcription with the patient, who gave verbal consent to proceed.  History of Present Illness Betty [name] is a 78 year old female with interstitial lung disease who presents with increased shortness of breath. She is accompanied by her husband, Rockey.  She has experienced increased shortness of breath over the past two months, and noticed lower oxygen  levels with lowest being 84%. She has oxygen  at home but admits to not using it. She denies any chronic cough, wheezing, chest congestion, hemoptysis. The only time she coughs is if she swallows something wrong.   She reports swelling in her legs, with the left leg being more swollen than the right, consistent with her baseline over the past several  years. She takes furosemide  (Lasix ) three times a week, as prescribed. No recent weight gain. There is no correlation between her breathing difficulties and the days she takes furosemide . No orthopnea, PND, CP, palpitations.   She has been prescribed albuterol  in the past but has not used it consistently and does not currently use an inhaler.  No life events or illnesses to coincide with worsening dyspnea.     Allergies  Allergen Reactions   Amoxicillin Rash    REACTION: RASH   Etodolac Other (See Comments)    Bloody stool    Hydrochlorothiazide  Other (See Comments)    Increases gout flare   Meloxicam Other (See Comments)    constipation   Sulfa Antibiotics Other (See Comments)    rash   Allopurinol     Metformin  And Related     diarrhea   Rosuvastatin  Other (See Comments)    extreme joint pain   Sulfonamide Derivatives     REACTION: RASH   Jardiance  [Empagliflozin ] Other (See Comments)    Yeast infection   Lasix  [Furosemide ] Other (See Comments)    Pt states it dried everything out of me    Immunization History  Administered Date(s) Administered   Fluad Quad(high Dose 65+) 04/30/2019, 05/17/2021, 05/21/2022   Fluad Trivalent(High Dose 65+) 05/27/2023   H1N1 09/08/2008   INFLUENZA, HIGH DOSE SEASONAL PF 05/12/2018, 05/19/2020, 05/05/2024   Influenza Split 06/28/2011, 05/01/2012   Influenza Whole 07/11/2007, 06/07/2008, 07/12/2009   Influenza,inj,Quad PF,6+ Mos 04/23/2014, 04/21/2015   Influenza-Unspecified 04/27/2013, 05/04/2016, 04/24/2017   Moderna Covid-19 Fall Seasonal Vaccine 49yrs & older 08/03/2022, 05/31/2023, 05/07/2024   PFIZER  Comirnaty(Gray Top)Covid-19 Tri-Sucrose Vaccine 12/23/2020   PFIZER(Purple Top)SARS-COV-2 Vaccination 09/17/2019, 10/08/2019, 05/30/2020, 12/23/2020   Pfizer Covid-19 Vaccine Bivalent Booster 68yrs & up 09/22/2021   Pneumococcal Conjugate-13 01/21/2014   Pneumococcal Polysaccharide-23 08/28/2011, 03/06/2012   Respiratory Syncytial  Virus Vaccine,Recomb Aduvanted(Arexvy) 05/23/2022   Td 10/03/2007, 10/14/2013   Tdap 10/11/2013, 04/22/2024   Zoster Recombinant(Shingrix) 04/13/2019, 05/18/2019, 07/16/2019   Zoster, Live 06/21/2010    Past Medical History:  Diagnosis Date   Allergic rhinitis    never tested, fall and spring   Arthritis    hands,knees, feet   Cataracts, bilateral    not surgical yet   CHOLECYSTECTOMY, HX OF 10/08/2007   Qualifier: Diagnosis of  By: Bartley MD, Lamar Mulch    Chronic diastolic CHF (congestive heart failure) (HCC)    Chronic respiratory failure (HCC)    Diabetes mellitus without complication (HCC)    Diverticulosis    problems with frequent gas, burping   Glucose intolerance (impaired glucose tolerance)    Gout    History of chicken pox    Hyperlipidemia    Hypertension    NICM (nonischemic cardiomyopathy) (HCC) 2002   EF 25%; improved to normal - echo 4/08: EF 50-55%, mild MR, mild LAE, mild TR;    cath 3/03: normal cors, EF 40%   Obesity    Oxygen  deficiency 2017   at night   PONV (postoperative nausea and vomiting)    after wisdom tooth extraction   Restrictive lung disease    Skin cancer    skin cancers only   Sleep apnea    cpap settings 4    Tobacco History: Social History   Tobacco Use  Smoking Status Never  Smokeless Tobacco Never   Counseling given: Not Answered   Outpatient Medications Prior to Visit  Medication Sig Dispense Refill   acetaminophen  (TYLENOL ) 500 MG tablet Take 500 mg by mouth every 8 (eight) hours as needed.     aspirin  81 MG EC tablet Take 1 tablet (81 mg total) by mouth daily. 90 tablet 2   CALCIUM -MAGNESIUM-VITAMIN D PO Take 1 tablet by mouth daily.     carvedilol  (COREG ) 12.5 MG tablet TAKE 1 TABLET (12.5MG  TOTAL) BY MOUTH TWICE A DAY WITH MEALS 180 tablet 1   clobetasol  ointment (TEMOVATE ) 0.05 % Place 0.25 Applicator vaginally once weekly. 30 g 5   Coenzyme Q10 (CO Q 10 PO) Take 200 mg by mouth at bedtime.      Colchicine   (MITIGARE ) 0.6 MG CAPS Day 1: take 1.2 mg (2 tablets) by mouth at the first sign of flare, followed by 0.6 mg (1 tablet) by mouth after 1 hour. Day 2 and thereafter: take 0.6 mg (1 tablet) by mouth daily until flare has resolved. 30 capsule 0   Dulaglutide  (TRULICITY ) 0.75 MG/0.5ML SOAJ Inject 0.75 mg into the skin once a week. 6 mL 1   estradiol  (ESTRACE ) 0.1 MG/GM vaginal cream Place 0.25 Applicatorfuls vaginally 3 (three) times a week. 90 g 3   ezetimibe  (ZETIA ) 10 MG tablet Take 1 tablet (10 mg total) by mouth daily. 90 tablet 1   Fish Oil OIL Take 1,200 mg by mouth 2 (two) times daily. GUMMIES     fluticasone  (FLONASE ) 50 MCG/ACT nasal spray SPRAY 2 SPRAYS INTO EACH NOSTRIL EVERY DAY 48 mL 2   furosemide  (LASIX ) 40 MG tablet Take 1 tablet (40 mg total) by mouth 3 (three) times a week. 30 tablet 3   losartan  (COZAAR ) 50 MG tablet Take 0.5 tablets (25  mg total) by mouth daily. 45 tablet 3   multivitamin (THERAGRAN) per tablet Take 1 tablet by mouth daily.     OXYGEN  Inhale 3 L into the lungs at bedtime.     polyvinyl alcohol (LIQUIFILM TEARS) 1.4 % ophthalmic solution Place 1 drop into both eyes every morning.     pravastatin  (PRAVACHOL ) 40 MG tablet Take it every other day 90 tablet 3   No facility-administered medications prior to visit.     Review of Systems: as above    Physical Exam:  BP 102/62   Pulse 80   Temp 98.3 F (36.8 C)   Ht 5' 5 (1.651 m)   Wt 202 lb 6.4 oz (91.8 kg)   SpO2 90% Comment: RA  BMI 33.68 kg/m   GEN: Pleasant, interactive, well-kempt; in no acute distress. HEENT:  Normocephalic and atraumatic. PERRLA. Sclera white. Nasal turbinates pink, moist and patent bilaterally. No rhinorrhea present. Oropharynx pink and moist, without exudate or edema. No lesions, ulcerations, or postnasal drip.  NECK:  Supple w/ fair ROM. No JVD present. No lymphadenopathy.   CV: RRR, no m/r/g, no peripheral edema. Pulses intact, +2 bilaterally. No cyanosis, pallor or  clubbing. PULMONARY:  Unlabored, regular breathing. Clear bilaterally A&P w/o wheezes/rales/rhonchi. No accessory muscle use. No dullness to percussion. GI: BS present and normoactive. Soft, non-tender to palpation.  MSK: No erythema, warmth or tenderness. Cap refil <2 sec all extrem.  Neuro: A/Ox3. No focal deficits noted.   Skin: Warm, no lesions or rashe Psych: Normal affect and behavior. Judgement and thought content appropriate.     Lab Results:  CBC    Component Value Date/Time   WBC 6.2 06/08/2024 1213   RBC 4.39 06/08/2024 1213   HGB 14.5 06/08/2024 1213   HCT 43.2 06/08/2024 1213   PLT 202.0 06/08/2024 1213   MCV 98.6 06/08/2024 1213   MCH 32.5 10/19/2019 1214   MCHC 33.5 06/08/2024 1213   RDW 12.8 06/08/2024 1213   LYMPHSABS 1.7 06/08/2024 1213   MONOABS 0.7 06/08/2024 1213   EOSABS 0.1 06/08/2024 1213   BASOSABS 0.0 06/08/2024 1213    BMET    Component Value Date/Time   NA 139 06/08/2024 1213   NA 143 04/09/2023 1155   K 4.0 06/08/2024 1213   CL 97 06/08/2024 1213   CO2 35 (H) 06/08/2024 1213   GLUCOSE 165 (H) 06/08/2024 1213   BUN 14 06/08/2024 1213   BUN 16 04/09/2023 1155   CREATININE 0.59 06/08/2024 1213   CREATININE 0.64 01/08/2014 1657   CALCIUM  9.1 06/08/2024 1213   GFRNONAA >60 10/19/2019 1214   GFRNONAA >89 01/08/2014 1657   GFRAA >60 10/19/2019 1214   GFRAA >89 01/08/2014 1657    BNP    Component Value Date/Time   BNP 28.0 10/19/2019 1214     Imaging:  DG Chest 2 View Result Date: 06/08/2024 CLINICAL DATA:  Increased dyspnea on exertion. EXAM: CHEST - 2 VIEW COMPARISON:  November 02, 2022 FINDINGS: The heart size and mediastinal contours are within normal limits. Low lung volumes are noted with mild, stable elevation of the right hemidiaphragm. Surgical sutures are seen overlying the right upper lobe. No acute infiltrate, pleural effusion or pneumothorax is identified. Radiopaque surgical clips are seen within the right upper quadrant. The  visualized skeletal structures are unremarkable. IMPRESSION: Low lung volumes without acute or active cardiopulmonary disease. Electronically Signed   By: Suzen Dials M.D.   On: 06/08/2024 12:49    Administration History  None          Latest Ref Rng & Units 01/07/2020    2:46 PM 11/21/2016    9:52 AM  PFT Results  FVC-Pre L 1.50  1.74   FVC-Predicted Pre % 49  55   FVC-Post L 1.33  1.76   FVC-Predicted Post % 43  56   Pre FEV1/FVC % % 86  84   Post FEV1/FCV % % 86  85   FEV1-Pre L 1.28  1.45   FEV1-Predicted Pre % 55  61   FEV1-Post L 1.14  1.49   DLCO uncorrected ml/min/mmHg 19.28  16.09   DLCO UNC% % 96  62   DLCO corrected ml/min/mmHg 19.28  15.89   DLCO COR %Predicted % 96  62   DLVA Predicted % 176  104   TLC L 4.61  3.19   TLC % Predicted % 88  61   RV % Predicted % 137  83     No results found for: NITRICOXIDE      Assessment & Plan:   ILD (interstitial lung disease) (HCC) Progressive DOE over the last several months. Concern for progression of ILD vs acute process. Will obtain CXR today and lab work including d dimer to rule out blood clot, BNP to assess for cardiac component and CBC to rule out anemia. She has increased oxygen  requirements and unable to maintain saturations on pulsed dosed therapy. Lung exam today is unremarkable. Euvolemic on exam. Will challenge her with prednisone  burst and albuterol  PRN; reassess response. Repeat HRCT chest and PFT ordered. Action plan in place. Strict return/ED precautions.   Patient Instructions  Trial your albuterol  inhaler 2 puffs every 6 hours as needed for shortness of breath or wheezing.   You need to be consistent with your oxygen  use when you are doing any form of activity  You need to wear 3 lpm continuous oxygen  when up moving and 2 lpm at rest. We will get you some portable tanks from your medical supply company. When you come back in, we can reassess and see if you can use the [employer] again    Continue to use CPAP every night, minimum of 4-6 hours a night.  Change equipment as directed. Wash your tubing with warm soap and water daily, hang to dry. Wash humidifier portion weekly. Use bottled, distilled water and change daily Be aware of reduced alertness and do not drive or operate heavy machinery if experiencing this or drowsiness.   Prednisone  40 mg daily for 5 days. Take in AM with food  Check labs and chest x ray today  Ordered HRCT chest scan and repeat lung function testing; I may move up the timing of your CT chest scan based on your labs   We may repeat your echocardiogram of your heart  Follow up in 7-10 days with Katie Jabre Heo,NP then in 6 weeks after PFT and CT scan with Dr. Shelah (1st). If symptoms do not improve or worsen, please contact office for sooner follow up or seek emergency care.    Chronic hypoxemic respiratory failure (HCC) Acute on chronic respiratory failure. See above. Will send urgent order to DME to provide her with portable O2 tanks given she is unable to maintain on POC at this time. Again reviewed risks of untreated hypoxia and importance of consistent O2 therapy. Verbalized understanding. Will reassess at follow up. Goal >88-90%  Pulmonary HTN (HCC) No significant PAH on prior exercise RHC. May need to consider repeat echo pending  workup and response to therapies.   Chronic diastolic heart failure (HCC) No significant edema on exam. Slight dependent edema in LLE; baseline per  her report. See above plan. Follow up with cardiology as scheduled. Continued lasix  as prescribed.   DOE (dyspnea on exertion) See above  Obstructive sleep apnea Continue CPAP nightly. Receives benefit from use. Aware of risks of untreated OSA    I spent 45 minutes of dedicated to the care of this patient on the date of this encounter to include pre-visit review of records, face-to-face time with the patient discussing conditions above, post visit ordering of testing,  clinical documentation with the electronic health record, making appropriate referrals as documented, and communicating necessary findings to members of the patients care team.  Comer LULLA Rouleau, NP 06/08/2024  Pt aware and understands NP's role.

## 2024-06-08 NOTE — Telephone Encounter (Signed)
 Follow up in 7-10 days with Izetta Cobb,NP then in 6 weeks after PFT and CT scan with Dr. Shelah (1st). If symptoms do not improve or worsen, please contact office for sooner follow up or seek emergency care.   PT needs to be seen In 7-10 day with cobb. She has no slots available. Please advise.

## 2024-06-09 ENCOUNTER — Telehealth: Payer: Self-pay | Admitting: Nurse Practitioner

## 2024-06-09 ENCOUNTER — Ambulatory Visit

## 2024-06-09 DIAGNOSIS — R2689 Other abnormalities of gait and mobility: Secondary | ICD-10-CM | POA: Diagnosis not present

## 2024-06-09 DIAGNOSIS — R2681 Unsteadiness on feet: Secondary | ICD-10-CM | POA: Insufficient documentation

## 2024-06-09 NOTE — Therapy (Unsigned)
 OUTPATIENT PHYSICAL THERAPY EVALUATION   Patient Name: Betty [name] MRN: [id number] DOB:[birth year], 78 y.o., female Today's Date: 06/09/2024  PCP: Gretel App, NP REFERRING PROVIDER: Luke Shade, MD   END OF SESSION:  PT End of Session - 06/09/24 1537     Visit Number 1    Number of Visits 16    Date for Recertification  08/04/24    Authorization Type [employer]    Authorization Time Period 06/09/24-08/04/24    Progress Note Due on Visit 10    PT Start Time 1533    PT Stop Time 1613    PT Time Calculation (min) 40 min    Activity Tolerance Patient tolerated treatment well;No increased pain    Behavior During Therapy WFL for tasks assessed/performed          Past Medical History:  Diagnosis Date   Allergic rhinitis    never tested, fall and spring   Arthritis    hands,knees, feet   Cataracts, bilateral    not surgical yet   CHOLECYSTECTOMY, HX OF 10/08/2007   Qualifier: Diagnosis of  By: Bartley MD, Lamar Mulch    Chronic diastolic CHF (congestive heart failure) (HCC)    Chronic respiratory failure (HCC)    Diabetes mellitus without complication (HCC)    Diverticulosis    problems with frequent gas, burping   Glucose intolerance (impaired glucose tolerance)    Gout    History of chicken pox    Hyperlipidemia    Hypertension    NICM (nonischemic cardiomyopathy) (HCC) 2002   EF 25%; improved to normal - echo 4/08: EF 50-55%, mild MR, mild LAE, mild TR;    cath 3/03: normal cors, EF 40%   Obesity    Oxygen  deficiency 2017   at night   PONV (postoperative nausea and vomiting)    after wisdom tooth extraction   Restrictive lung disease    Skin cancer    skin cancers only   Sleep apnea    cpap settings 4   Past Surgical History:  Procedure Laterality Date   BILATERAL VATS ABLATION Right    biopsy done 2015- [facility]   BREAST EXCISIONAL BIOPSY Left 80's   NEG   BREAST SURGERY  80's   small cyst- non-cancerous   CARDIAC CATHETERIZATION     CATARACT  EXTRACTION W/PHACO Right 06/21/2020   Procedure: CATARACT EXTRACTION PHACO AND INTRAOCULAR LENS PLACEMENT (IOC) RIGHT DIABETIC;  Surgeon: Jaye Fallow, MD;  Location: [facility];  Service: Ophthalmology;  Laterality: Right;  4.06 0:32.8   CATARACT EXTRACTION W/PHACO Left 07/12/2020   Procedure: CATARACT EXTRACTION PHACO AND INTRAOCULAR LENS PLACEMENT (IOC) LEFT DIABETIC 6.96 00:46.3;  Surgeon: Jaye Fallow, MD;  Location: [facility];  Service: Ophthalmology;  Laterality: Left;  Diabetic - oral meds   CHOLECYSTECTOMY     choleystectomy  02/2002   COLONOSCOPY WITH PROPOFOL  N/A 12/20/2014   Procedure: COLONOSCOPY WITH PROPOFOL ;  Surgeon: Gordy CHRISTELLA Starch, MD;  Location: [facility];  Service: Gastroenterology;  Laterality: N/A;   COLONOSCOPY WITH PROPOFOL  N/A 03/22/2020   Procedure: COLONOSCOPY WITH PROPOFOL ;  Surgeon: Starch Gordy CHRISTELLA, MD;  Location: [facility];  Service: Gastroenterology;  Laterality: N/A;   CYSTECTOMY  1996   l breast   DILATION AND CURETTAGE OF UTERUS  2011   Dr. Starla at [facility]   INDUCED ABORTION     nsvd     x 2   POLYPECTOMY  03/22/2020   Procedure: POLYPECTOMY;  Surgeon: Starch Gordy CHRISTELLA, MD;  Location: [facility];  Service: Gastroenterology;;   TUBAL LIGATION  1975   VAGINAL DELIVERY     x2   Patient Active Problem List   Diagnosis Date Noted   ILD (interstitial lung disease) (HCC) 06/08/2024   Controlled type 2 diabetes mellitus with diabetic nephropathy, without long-term current use of insulin (HCC) 06/04/2024   Callus 06/04/2024   Balance problem 06/04/2024   Primary hypertension 10/30/2023   Onychomycosis 11/19/2022   Venous insufficiency 05/17/2021   Benign neoplasm of sigmoid colon    Pulmonary HTN (HCC) 10/27/2019   Restrictive lung disease 08/06/2016   Left-sided low back pain with left-sided sciatica 05/01/2016   Encounter for general adult medical examination with abnormal findings 01/27/2016   Benign neoplasm of  ascending colon    Benign neoplasm of transverse colon    Chronic diastolic heart failure (HCC) 04/08/2014   Obesity (BMI 30-39.9) 04/09/2013   Diabetes mellitus type 2, controlled (HCC) 01/07/2013   Chronic hypoxemic respiratory failure (HCC) 11/11/2012   Obstructive sleep apnea 11/11/2012   DOE (dyspnea on exertion) 08/18/2012   GOUT 12/26/2009   ALLERGIC RHINITIS, SEASONAL 10/08/2007   HYPERLIPIDEMIA, MIXED 05/02/2007    ONSET DATE: >1 year, chronic progressive REFERRING DIAG: imbalance  THERAPY DIAG:  Unsteadiness on feet  Rationale for Evaluation and Treatment: Rehabilitation  SUBJECTIVE:                                                                                                                                                                                             SUBJECTIVE STATEMENT: Pt needs help with her balance.  PERTINENT HISTORY:   78yoF who is referred by PCP for progressive changes in balance. Pt reports >2 years, Pt reports history of neuropathy. Pt also functionally limited in exertion by diastolic heart failure.   PAIN:  Are you having pain? No; not typically a problem.   PRECAUTIONS: None  WEIGHT BEARING RESTRICTIONS: No  FALLS: Has patient fallen in last 6 months? No (1 fall in shower 3 years, ago, pt says not related to current issues)   LIVING ENVIRONMENT: Lives with: husband Lives in: house  Stairs: 3 step to enter, 1 railing (having a a second installed soon)  Has following equipment at home: [facility] for heart failure reasons, longer diagnoses  PATIENT GOALS:  not sure  OBJECTIVE:  Note: Objective measures were completed at Evaluation unless otherwise noted.  SENSATION: Has known neuropathy, will see podiatry on 06/10/24 to become established;   PATIENT SURVEYS:  ABC 58% 5xSTS: hands free: 19 15.64sec : 10.41sec, no device, lateral deviation 0.85m/s   30sec eyes closed narrow firm (30s);  narrow foam (30s); narrow incline (30sec)                                                                                                                               TREATMENT DATE 06/09/24 :        PATIENT EDUCATION: Education details: Pt will need  Person educated:  Education method: Explanation Education comprehension: verbalized understanding  HOME EXERCISE PROGRAM: ***  GOALS: Goals reviewed with patient? {yes/no:20286}  SHORT TERM GOALS: Target date: ***  *** Baseline: Goal status: INITIAL  2.  *** Baseline:  Goal status: INITIAL  3.  *** Baseline:  Goal status: INITIAL  4.  *** Baseline:  Goal status: INITIAL  5.  *** Baseline:  Goal status: INITIAL  6.  *** Baseline:  Goal status: INITIAL  LONG TERM GOALS: Target date: ***  *** Baseline:  Goal status: INITIAL  2.  *** Baseline:  Goal status: INITIAL  3.  *** Baseline:  Goal status: INITIAL  4.  *** Baseline:  Goal status: INITIAL  5.  *** Baseline:  Goal status: INITIAL  6.  *** Baseline:  Goal status: INITIAL  ASSESSMENT:  CLINICAL IMPRESSION: Patient is a *** y.o. *** who was seen today for physical therapy evaluation and treatment for ***.   OBJECTIVE IMPAIRMENTS: {opptimpairments:25111}.   ACTIVITY LIMITATIONS: {activitylimitations:27494}  PARTICIPATION LIMITATIONS: {participationrestrictions:25113}  PERSONAL FACTORS: {Personal factors:25162} are also affecting patient's functional outcome.   REHAB POTENTIAL: {rehabpotential:25112}  CLINICAL DECISION MAKING: {clinical decision making:25114}  EVALUATION COMPLEXITY: {Evaluation complexity:25115}  PLAN:  PT FREQUENCY: {rehab frequency:25116}  PT DURATION: {rehab duration:25117}  PLANNED INTERVENTIONS: {rehab planned interventions:25118::97110-Therapeutic exercises,97530- Therapeutic [phone]- Neuromuscular re-education,97535- Self Rjmz,[phone]- Manual therapy,Patient/Family education}  PLAN FOR NEXT SESSION: ***   Uziel Covault C,  PT 06/09/2024, 3:40 PM

## 2024-06-09 NOTE — Telephone Encounter (Signed)
 Per Terri at [facility]- The confusion is that the order states continuous needs portable O2 tanks, then it states she can use a conserving device. If she is continuous flow, then she cannot use a conserving device. But if it is just a matter of the patient is wanting portable tanks and can use pulse dose, we can give a conserving device regulator. Patient can use POC at 3LPM is that okay or would you prefer her to be on tanks?   Please advise

## 2024-06-09 NOTE — Telephone Encounter (Signed)
 I have informed [employer]. NFN

## 2024-06-09 NOTE — Telephone Encounter (Signed)
 No, she needs continuous flow portable tanks as stated on 3 lpm. It was likely just part of the dot phrase. Thanks

## 2024-06-10 DIAGNOSIS — E1140 Type 2 diabetes mellitus with diabetic neuropathy, unspecified: Secondary | ICD-10-CM | POA: Diagnosis not present

## 2024-06-10 DIAGNOSIS — B351 Tinea unguium: Secondary | ICD-10-CM | POA: Diagnosis not present

## 2024-06-15 ENCOUNTER — Ambulatory Visit: Admitting: Adult Health

## 2024-06-15 ENCOUNTER — Encounter: Payer: Self-pay | Admitting: Adult Health

## 2024-06-15 VITALS — BP 124/72 | HR 86 | Ht 65.0 in | Wt 201.5 lb

## 2024-06-15 DIAGNOSIS — R0609 Other forms of dyspnea: Secondary | ICD-10-CM

## 2024-06-15 DIAGNOSIS — J849 Interstitial pulmonary disease, unspecified: Secondary | ICD-10-CM

## 2024-06-15 DIAGNOSIS — R5381 Other malaise: Secondary | ICD-10-CM | POA: Diagnosis not present

## 2024-06-15 DIAGNOSIS — J9611 Chronic respiratory failure with hypoxia: Secondary | ICD-10-CM

## 2024-06-15 NOTE — Progress Notes (Signed)
 @Patient  ID: Betty [name], female    DOB: [birth year], 78 y.o.   MRN: [id number]  Chief Complaint  Patient presents with   Medical Management of Chronic Issues    Referring provider: Gretel App, NP  HPI: 78 year old female never smoker followed for obstructive sleep apnea on nocturnal CPAP, restrictive lung disease with mild interstitial lung changes, chronic respiratory failure on oxygen  Medical history significant for cardiomyopathy, pulmonary hypertension, congestive heart failure    TEST/EVENTS :  01/07/2020 PFTs: FVC 49, FEV1 55, ratio 86, TLC 88, DLCOcor 96  ILD: [facility] workup  surgical lung bx that showed mild parabronchial fibrotic change without inflammation with some arteriopathic PAH.   Exercise RHC showed some diastolic dysfunction but no exercise related PAH   Discussed the use of AI scribe software for clinical note transcription with the patient, who gave verbal consent to proceed.  History of Present Illness Betty [name] is a 78 year old female with restrictive lung disease and chronic respiratory failure who presents for follow-up after a possible flare of interstitial lung disease.   She has experienced progressive shortness of breath over the last several months. A recent chest x-ray showed chronic interstitial changes. Pulmonary function test and high-resolution CT chest are pending. Lab work including CBC, D-dimer, and BNP were normal.  Last visit was recommended to use Oxygen  3l/m continuous flow with activity.   She has not used her albuterol  inhaler. Since last visit she really has not noticed much difference in her breathing.  Walk test today in the office shows that she is able to use a pulsed oxygen  device at 3 L to maintain O2 saturations greater than 90%. Previously had POC device but was changed to tanks due to continuous flow requirement. Can't not carry these as they are too heavy. Wants to get POC device back.    She has not been on  medications like amiodarone, methotrexate, or Macrobid for chronic urinary tract infections. S  She uses a CPAP machine with oxygen  at night . Feels she benefits from this. CPAP download shows 100% compliance with AHI 0.1 /hr    Walk test  06/15/2024    Patient Saturations on Room Air at Rest = 90%   Patient Saturations on Room Air while Ambulating = 86%   Patient Saturations on pulsed 3 Liters of oxygen  while Ambulating = 95%   Please briefly explain why patient needs home oxygen :patient was unable to walk no more then a few steps before O2 sats dropped to 86%, placed on pulsed 3L of O2 sats went back up to 95 %   Allergies  Allergen Reactions   Amoxicillin Rash    REACTION: RASH   Etodolac Other (See Comments)    Bloody stool    Hydrochlorothiazide  Other (See Comments)    Increases gout flare   Meloxicam Other (See Comments)    constipation   Sulfa Antibiotics Other (See Comments)    rash   Allopurinol     Metformin  And Related     diarrhea   Rosuvastatin  Other (See Comments)    extreme joint pain   Sulfonamide Derivatives     REACTION: RASH   Jardiance  [Empagliflozin ] Other (See Comments)    Yeast infection   Lasix  [Furosemide ] Other (See Comments)    Pt states it dried everything out of me    Immunization History  Administered Date(s) Administered   Fluad Quad(high Dose 65+) 04/30/2019, 05/17/2021, 05/21/2022   Fluad Trivalent(High Dose 65+) 05/27/2023   H1N1 09/08/2008  INFLUENZA, HIGH DOSE SEASONAL PF 05/12/2018, 05/19/2020, 05/05/2024   Influenza Split 06/28/2011, 05/01/2012   Influenza Whole 07/11/2007, 06/07/2008, 07/12/2009   Influenza,inj,Quad PF,6+ Mos 04/23/2014, 04/21/2015   Influenza-Unspecified 04/27/2013, 05/04/2016, 04/24/2017   Moderna Covid-19 Fall Seasonal Vaccine 28yrs & older 08/03/2022, 05/31/2023, 05/07/2024   PFIZER Comirnaty(Gray Top)Covid-19 Tri-Sucrose Vaccine 12/23/2020   PFIZER(Purple Top)SARS-COV-2 Vaccination 09/17/2019,  10/08/2019, 05/30/2020, 12/23/2020   Pfizer Covid-19 Vaccine Bivalent Booster 43yrs & up 09/22/2021   Pneumococcal Conjugate-13 01/21/2014   Pneumococcal Polysaccharide-23 08/28/2011, 03/06/2012   Respiratory Syncytial Virus Vaccine,Recomb Aduvanted(Arexvy) 05/23/2022   Td 10/03/2007, 10/14/2013   Tdap 10/11/2013, 04/22/2024   Zoster Recombinant(Shingrix) 04/13/2019, 05/18/2019, 07/16/2019   Zoster, Live 06/21/2010    Past Medical History:  Diagnosis Date   Allergic rhinitis    never tested, fall and spring   Arthritis    hands,knees, feet   Cataracts, bilateral    not surgical yet   CHOLECYSTECTOMY, HX OF 10/08/2007   Qualifier: Diagnosis of  By: Bartley MD, Lamar Mulch    Chronic diastolic CHF (congestive heart failure) (HCC)    Chronic respiratory failure (HCC)    Diabetes mellitus without complication (HCC)    Diverticulosis    problems with frequent gas, burping   Glucose intolerance (impaired glucose tolerance)    Gout    History of chicken pox    Hyperlipidemia    Hypertension    NICM (nonischemic cardiomyopathy) (HCC) 2002   EF 25%; improved to normal - echo 4/08: EF 50-55%, mild MR, mild LAE, mild TR;    cath 3/03: normal cors, EF 40%   Obesity    Oxygen  deficiency 2017   at night   PONV (postoperative nausea and vomiting)    after wisdom tooth extraction   Restrictive lung disease    Skin cancer    skin cancers only   Sleep apnea    cpap settings 4    Tobacco History: Social History   Tobacco Use  Smoking Status Never  Smokeless Tobacco Never   Counseling given: Not Answered   Outpatient Medications Prior to Visit  Medication Sig Dispense Refill   acetaminophen  (TYLENOL ) 500 MG tablet Take 500 mg by mouth every 8 (eight) hours as needed.     albuterol  (VENTOLIN  HFA) 108 (90 Base) MCG/ACT inhaler Inhale 2 puffs into the lungs every 6 (six) hours as needed for wheezing or shortness of breath. 8 g 3   aspirin  81 MG EC tablet Take 1 tablet (81 mg  total) by mouth daily. 90 tablet 2   CALCIUM -MAGNESIUM-VITAMIN D PO Take 1 tablet by mouth daily.     carvedilol  (COREG ) 12.5 MG tablet TAKE 1 TABLET (12.5MG  TOTAL) BY MOUTH TWICE A DAY WITH MEALS 180 tablet 1   clobetasol  ointment (TEMOVATE ) 0.05 % Place 0.25 Applicator vaginally once weekly. 30 g 5   Coenzyme Q10 (CO Q 10 PO) Take 200 mg by mouth at bedtime.      Colchicine  (MITIGARE ) 0.6 MG CAPS Day 1: take 1.2 mg (2 tablets) by mouth at the first sign of flare, followed by 0.6 mg (1 tablet) by mouth after 1 hour. Day 2 and thereafter: take 0.6 mg (1 tablet) by mouth daily until flare has resolved. 30 capsule 0   Dulaglutide  (TRULICITY ) 0.75 MG/0.5ML SOAJ Inject 0.75 mg into the skin once a week. 6 mL 1   estradiol  (ESTRACE ) 0.1 MG/GM vaginal cream Place 0.25 Applicatorfuls vaginally 3 (three) times a week. 90 g 3   ezetimibe  (ZETIA ) 10 MG tablet Take  1 tablet (10 mg total) by mouth daily. 90 tablet 1   Fish Oil OIL Take 1,200 mg by mouth 2 (two) times daily. GUMMIES     fluticasone  (FLONASE ) 50 MCG/ACT nasal spray SPRAY 2 SPRAYS INTO EACH NOSTRIL EVERY DAY 48 mL 2   furosemide  (LASIX ) 40 MG tablet Take 1 tablet (40 mg total) by mouth 3 (three) times a week. 30 tablet 3   losartan  (COZAAR ) 50 MG tablet Take 0.5 tablets (25 mg total) by mouth daily. 45 tablet 3   multivitamin (THERAGRAN) per tablet Take 1 tablet by mouth daily.     OXYGEN  Inhale 3 L into the lungs at bedtime.     polyvinyl alcohol (LIQUIFILM TEARS) 1.4 % ophthalmic solution Place 1 drop into both eyes every morning.     pravastatin  (PRAVACHOL ) 40 MG tablet Take it every other day 90 tablet 3   No facility-administered medications prior to visit.     Review of Systems:   Constitutional:   No  weight loss, night sweats,  Fevers, chills, +fatigue, or  lassitude.  HEENT:   No headaches,  Difficulty swallowing,  Tooth/dental problems, or  Sore throat,                No sneezing, itching, ear ache, nasal congestion, post nasal  drip,   CV:  No chest pain,  Orthopnea, PND, swelling in lower extremities, anasarca, dizziness, palpitations, syncope.   GI  No heartburn, indigestion, abdominal pain, nausea, vomiting, diarrhea, change in bowel habits, loss of appetite, bloody stools.   Resp: .  No chest wall deformity  Skin: no rash or lesions.  GU: no dysuria, change in color of urine, no urgency or frequency.  No flank pain, no hematuria   MS:  No joint pain or swelling.  No decreased range of motion.  No back pain.    Physical Exam  BP 124/72   Pulse 86   Ht 5' 5 (1.651 m)   Wt 201 lb 8 oz (91.4 kg)   SpO2 90%   BMI 33.53 kg/m   GEN: A/Ox3; pleasant , NAD, well nourished    HEENT:  [place]/AT,   NOSE-clear, THROAT-clear, no lesions, no postnasal drip or exudate noted.   NECK:  Supple w/ fair ROM; no JVD; normal carotid impulses w/o bruits; no thyromegaly or nodules palpated; no lymphadenopathy.    RESP  Clear  P & A; w/o, wheezes/ rales/ or rhonchi. no accessory muscle use, no dullness to percussion  CARD:  RRR, no m/r/g, no peripheral edema, pulses intact, no cyanosis or clubbing.  GI:   Soft & nt; nml bowel sounds; no organomegaly or masses detected.   Musco: Warm bil, no deformities or joint swelling noted.   Neuro: alert, no focal deficits noted.    Skin: Warm, no lesions or rashes    Lab Results:  CBC    Component Value Date/Time   WBC 6.2 06/08/2024 1213   RBC 4.39 06/08/2024 1213   HGB 14.5 06/08/2024 1213   HCT 43.2 06/08/2024 1213   PLT 202.0 06/08/2024 1213   MCV 98.6 06/08/2024 1213   MCH 32.5 10/19/2019 1214   MCHC 33.5 06/08/2024 1213   RDW 12.8 06/08/2024 1213   LYMPHSABS 1.7 06/08/2024 1213   MONOABS 0.7 06/08/2024 1213   EOSABS 0.1 06/08/2024 1213   BASOSABS 0.0 06/08/2024 1213    BMET    Component Value Date/Time   NA 139 06/08/2024 1213   NA 143 04/09/2023 1155  K 4.0 06/08/2024 1213   CL 97 06/08/2024 1213   CO2 35 (H) 06/08/2024 1213   GLUCOSE 165 (H)  06/08/2024 1213   BUN 14 06/08/2024 1213   BUN 16 04/09/2023 1155   CREATININE 0.59 06/08/2024 1213   CREATININE 0.64 01/08/2014 1657   CALCIUM  9.1 06/08/2024 1213   GFRNONAA >60 10/19/2019 1214   GFRNONAA >89 01/08/2014 1657   GFRAA >60 10/19/2019 1214   GFRAA >89 01/08/2014 1657    BNP    Component Value Date/Time   BNP 28.0 10/19/2019 1214    ProBNP    Component Value Date/Time   PROBNP 11.0 06/08/2024 1213    Imaging: DG Chest 2 View Result Date: 06/08/2024 CLINICAL DATA:  Increased dyspnea on exertion. EXAM: CHEST - 2 VIEW COMPARISON:  November 02, 2022 FINDINGS: The heart size and mediastinal contours are within normal limits. Low lung volumes are noted with mild, stable elevation of the right hemidiaphragm. Surgical sutures are seen overlying the right upper lobe. No acute infiltrate, pleural effusion or pneumothorax is identified. Radiopaque surgical clips are seen within the right upper quadrant. The visualized skeletal structures are unremarkable. IMPRESSION: Low lung volumes without acute or active cardiopulmonary disease. Electronically Signed   By: Suzen Dials M.D.   On: 06/08/2024 12:49    Administration History     None          Latest Ref Rng & Units 01/07/2020    2:46 PM 11/21/2016    9:52 AM  PFT Results  FVC-Pre L 1.50  1.74   FVC-Predicted Pre % 49  55   FVC-Post L 1.33  1.76   FVC-Predicted Post % 43  56   Pre FEV1/FVC % % 86  84   Post FEV1/FCV % % 86  85   FEV1-Pre L 1.28  1.45   FEV1-Predicted Pre % 55  61   FEV1-Post L 1.14  1.49   DLCO uncorrected ml/min/mmHg 19.28  16.09   DLCO UNC% % 96  62   DLCO corrected ml/min/mmHg 19.28  15.89   DLCO COR %Predicted % 96  62   DLVA Predicted % 176  104   TLC L 4.61  3.19   TLC % Predicted % 88  61   RV % Predicted % 137  83     No results found for: NITRICOXIDE      Assessment & Plan:   Assessment and Plan Assessment & Plan Restrictive lung disease with interstitial changes   Previous lung biopsy showing mild fibrosis. She experienced a recent flare with progressive shortness of breath over several months. A chest X-ray  showed stable chronic interstitial changes without an acute process.  HRCT and PFT  pending .  Possible recent flare-received steroid burst.  Oxygenation seems to be improved since last visit.  Will wait CT scan and PFT results.  Previous lab work was unrevealing with BNP and D-dimer and CBC.  Physical Deconditioning -declining .  Deconditioning is secondary to chronic lung disease and reduced activity from shortness of breath. It is important to maintain activity levels to prevent further deconditioning. Encourage the use of supplemental oxygen  during activity to improve exercise tolerance and prevent further deconditioning.  Obstructive sleep apnea on nocturnal CPAP with oxygen   -stable  Obstructive sleep apnea is managed with nocturnal CPAP and supplemental oxygen . Continue CPAP with supplemental oxygen  at 3 liters.  Chronic respiratory failure with exertional hypoxemia.  Walk test today in office shows patient qualifies for pulsing oxygen  order  for POC has been sent to DME.  Continue on oxygen  at 3 to 4 L to maintain O2 saturations greater than 90%.  Patient is encouraged to use oxygen  with activity to maintain O2 saturations greater than 90%  Plan  Patient Instructions  Albuterol  inhaler 2 puffs every 6 hours as needed for shortness of breath or wheezing.   Please use your Oxygen  3-4 l/m pulsed with ambulation.   Continue to use CPAP every night, minimum of 4-6 hours a night with Oxygen  3l/m   CT chest and PFT as planned   Follow up with Cobb NP in [place] in 4 weeks and As needed   Please contact office for sooner follow up if symptoms do not improve or worsen or seek emergency care              Madelin Stank, NP 06/15/2024

## 2024-06-15 NOTE — Patient Instructions (Addendum)
 Albuterol  inhaler 2 puffs every 6 hours as needed for shortness of breath or wheezing.   Please use your Oxygen  3-4 l/m pulsed with ambulation.   Continue to use CPAP every night, minimum of 4-6 hours a night with Oxygen  3l/m   CT chest and PFT as planned   Follow up with Cobb NP in [place] in 4 weeks and As needed   Please contact office for sooner follow up if symptoms do not improve or worsen or seek emergency care

## 2024-06-16 ENCOUNTER — Ambulatory Visit

## 2024-06-17 ENCOUNTER — Encounter (HOSPITAL_BASED_OUTPATIENT_CLINIC_OR_DEPARTMENT_OTHER)

## 2024-06-22 ENCOUNTER — Ambulatory Visit: Admitting: Physical Therapy

## 2024-06-22 DIAGNOSIS — R2681 Unsteadiness on feet: Secondary | ICD-10-CM | POA: Diagnosis not present

## 2024-06-22 DIAGNOSIS — R2689 Other abnormalities of gait and mobility: Secondary | ICD-10-CM | POA: Diagnosis not present

## 2024-06-22 NOTE — Therapy (Signed)
 OUTPATIENT PHYSICAL THERAPY TREATMENT  Patient Name: Betty [name] MRN: [id number] DOB:[birth year], 78 y.o., female Today's Date: 06/22/2024  PCP: Gretel App, NP REFERRING PROVIDER: Luke Shade, MD   END OF SESSION:  PT End of Session - 06/22/24 1331     Visit Number 2    Number of Visits 16    Date for Recertification  08/04/24    Authorization Type [employer]    Authorization Time Period 06/09/24-08/04/24    Progress Note Due on Visit 10    PT Start Time 1019    PT Stop Time 1058    PT Time Calculation (min) 39 min    Activity Tolerance Patient tolerated treatment well;No increased pain    Behavior During Therapy WFL for tasks assessed/performed          Past Medical History:  Diagnosis Date   Allergic rhinitis    never tested, fall and spring   Arthritis    hands,knees, feet   Cataracts, bilateral    not surgical yet   CHOLECYSTECTOMY, HX OF 10/08/2007   Qualifier: Diagnosis of  By: Bartley MD, Lamar Mulch    Chronic diastolic CHF (congestive heart failure) (HCC)    Chronic respiratory failure (HCC)    Diabetes mellitus without complication (HCC)    Diverticulosis    problems with frequent gas, burping   Glucose intolerance (impaired glucose tolerance)    Gout    History of chicken pox    Hyperlipidemia    Hypertension    NICM (nonischemic cardiomyopathy) (HCC) 2002   EF 25%; improved to normal - echo 4/08: EF 50-55%, mild MR, mild LAE, mild TR;    cath 3/03: normal cors, EF 40%   Obesity    Oxygen  deficiency 2017   at night   PONV (postoperative nausea and vomiting)    after wisdom tooth extraction   Restrictive lung disease    Skin cancer    skin cancers only   Sleep apnea    cpap settings 4   Past Surgical History:  Procedure Laterality Date   BILATERAL VATS ABLATION Right    biopsy done 2015- [facility]   BREAST EXCISIONAL BIOPSY Left 80's   NEG   BREAST SURGERY  80's   small cyst- non-cancerous   CARDIAC CATHETERIZATION     CATARACT  EXTRACTION W/PHACO Right 06/21/2020   Procedure: CATARACT EXTRACTION PHACO AND INTRAOCULAR LENS PLACEMENT (IOC) RIGHT DIABETIC;  Surgeon: Jaye Fallow, MD;  Location: [facility];  Service: Ophthalmology;  Laterality: Right;  4.06 0:32.8   CATARACT EXTRACTION W/PHACO Left 07/12/2020   Procedure: CATARACT EXTRACTION PHACO AND INTRAOCULAR LENS PLACEMENT (IOC) LEFT DIABETIC 6.96 00:46.3;  Surgeon: Jaye Fallow, MD;  Location: [facility];  Service: Ophthalmology;  Laterality: Left;  Diabetic - oral meds   CHOLECYSTECTOMY     choleystectomy  02/2002   COLONOSCOPY WITH PROPOFOL  N/A 12/20/2014   Procedure: COLONOSCOPY WITH PROPOFOL ;  Surgeon: Gordy CHRISTELLA Starch, MD;  Location: [facility];  Service: Gastroenterology;  Laterality: N/A;   COLONOSCOPY WITH PROPOFOL  N/A 03/22/2020   Procedure: COLONOSCOPY WITH PROPOFOL ;  Surgeon: Starch Gordy CHRISTELLA, MD;  Location: [facility];  Service: Gastroenterology;  Laterality: N/A;   CYSTECTOMY  1996   l breast   DILATION AND CURETTAGE OF UTERUS  2011   Dr. Starla at [facility]   INDUCED ABORTION     nsvd     x 2   POLYPECTOMY  03/22/2020   Procedure: POLYPECTOMY;  Surgeon: Starch Gordy CHRISTELLA, MD;  Location: [facility];  Service: Gastroenterology;;   TUBAL LIGATION  1975   VAGINAL DELIVERY     x2   Patient Active Problem List   Diagnosis Date Noted   ILD (interstitial lung disease) (HCC) 06/08/2024   Controlled type 2 diabetes mellitus with diabetic nephropathy, without long-term current use of insulin (HCC) 06/04/2024   Callus 06/04/2024   Balance problem 06/04/2024   Primary hypertension 10/30/2023   Onychomycosis 11/19/2022   Venous insufficiency 05/17/2021   Benign neoplasm of sigmoid colon    Pulmonary HTN (HCC) 10/27/2019   Restrictive lung disease 08/06/2016   Left-sided low back pain with left-sided sciatica 05/01/2016   Encounter for general adult medical examination with abnormal findings 01/27/2016   Benign neoplasm of  ascending colon    Benign neoplasm of transverse colon    Chronic diastolic heart failure (HCC) 04/08/2014   Obesity (BMI 30-39.9) 04/09/2013   Diabetes mellitus type 2, controlled (HCC) 01/07/2013   Chronic hypoxemic respiratory failure (HCC) 11/11/2012   Obstructive sleep apnea 11/11/2012   DOE (dyspnea on exertion) 08/18/2012   GOUT 12/26/2009   ALLERGIC RHINITIS, SEASONAL 10/08/2007   HYPERLIPIDEMIA, MIXED 05/02/2007    ONSET DATE: >1 year, chronic progressive REFERRING DIAG: imbalance  THERAPY DIAG:  Unsteadiness on feet  Rationale for Evaluation and Treatment: Rehabilitation  SUBJECTIVE:                                                                                                                                                                                             SUBJECTIVE STATEMENT: Pt reports no falls or LOB over the weekend. Pt worked on some Christmas presents over the weekend.   PERTINENT HISTORY:   78yoF who is referred by PCP for progressive changes in balance. Pt reports >2 years, Pt reports history of neuropathy. Pt also functionally limited in exertion by diastolic heart failure diagnosis, attended [facility] in 2023. Pt has ILD, is starting to have O2 delivery to home October 2025, but hesitant to begin using it.   PAIN:  Are you having pain? No; not typically a problem.   PRECAUTIONS: None  WEIGHT BEARING RESTRICTIONS: No  FALLS: Has patient fallen in last 6 months? No (1 fall in shower 3 years, ago, pt says not related to current issues)   LIVING ENVIRONMENT: Lives with: husband Lives in: house  Stairs: 3 step to enter, 1 railing (having a a second installed soon)  Has following equipment at home: [facility] for heart failure reasons, longer diagnoses  PATIENT GOALS:  not sure  OBJECTIVE:  Note: Objective measures were completed at Evaluation unless otherwise noted.  SENSATION: Has  known neuropathy, will see podiatry on 06/10/24 to become  established;   PATIENT SURVEYS:  ABC 58% 5xSTS: hands free: 19 15.64sec : 10.41sec, no device, lateral deviation on return, sway 0.49m/s   30sec eyes closed narrow firm (30s); narrow foam (30s); narrow incline (30sec)                                                                                                                              TREATMENT DATE 06/22/24 :    NMR: To facilitate reeducation of movement, balance, posture, coordination, and/or proprioception/kinesthetic sense.  Walking with eyes closed x 3 rounds 40 ft ea - retro walking with head turns x 3 rounds ( 40 ft ea)   TE- To improve strength, endurance, mobility, and function of specific targeted muscle groups or improve joint range of motion or improve muscle flexibility  Standing Hip Extension with Counter Support  -2 sets - 10 reps  Leg press for hip extension strength - attempted but posture/ positioning caused SPO2 drop to 89%, could not recover in this posture so deferred this exercise.   TA- To improve functional movements patterns for everyday tasks   - Sit to Stand Without Arm Support   2 sets - 10 reps - after each round SPO2 drops to 90-91%, recovers in 1-2 min with rest and pursed lip breathing  - Sidestepping near counter    2 sets - 10 reps  Self care: handout provided and explained and answered questions regarding this.   Instructed pt in pursed lip breathing to work on improving oxygen  delivery, instructed her to complete these between exercises at home and to keep track of her SP02 during exercise as she has a monitor at home. Pt instructed to rest if SPO2 is below 94%  PATIENT EDUCATION: Education details: Pt will need to consider how she will know that PT is making a functional difference in her life in the ability to fully participate in all meaningful activities.  Person educated:  Education method: Explanation Education comprehension: verbalized understanding  HOME EXERCISE  PROGRAM: Access Code: [email or web address] URL: [email or web address] Date: 06/22/2024 Prepared by: Lonni Gainer  Exercises - Sit to Stand Without Arm Support  - 1 x daily - 5 x weekly - 2 sets - 10 reps - Standing Hip Extension with Counter Support  - 1 x daily - 7 x weekly - 2 sets - 10 reps - Sidestepping near counter   - 1 x daily - 7 x weekly - 2 sets - 10 reps  GOALS: Goals reviewed with patient? No  SHORT TERM GOALS: Target date: 07/10/24  ABC scale score improvement >12% to indicate improved confidence.  Baseline: Goal status: INITIAL  2.  Pt to demonstrate 5xSTS from standard height chair, hands free in <16sec Baseline: <16 sec from 19  Goal status: INITIAL  3.  Pt to report safe and consistent performance of weekly balance program at home.  Baseline:  Goal status: INITIAL  LONG TERM GOALS: Target date: 08/04/24  Pt to report regular performance in advanced home program to maintain current balance performance and gains long term.  Baseline:  Goal status: INITIAL  2.  ABC Scale improvement > 25% to indicate improved confidence.  Baseline:  Goal status: INITIAL  3.  5xSTS <12 sec hands free from standard chair height.  Baseline:  Goal status: INITIAL  4.  <9.5sec without deviation from line of progression.  Baseline:  Goal status: INITIAL  ASSESSMENT:  CLINICAL IMPRESSION:  Patient arrived with good motivation for completion of pt activities. Pt provided with initial home exercise program focusing on hip strength this date. Pt had to be monitored for SPO2 levels throughout session and was instructed to manage and check these at home with activities. Will continue to progress balance and strength at next visit while monitoring SPO2%. Pt will continue to benefit from skilled physical therapy intervention to address impairments, improve QOL, and attain therapy goals.     OBJECTIVE IMPAIRMENTS: Decreased knowledge of condition, decreased use of  DME, decreased mobility, difficulty walking, decreased strength, decreased ROM. ACTIVITY LIMITATIONS: Lifting, standing, walking, squatting, transfers, locomotion level PARTICIPATION LIMITATIONS: Cleaning, laundry, interpersonal relationships, driving, yardwork, community activity.  PERSONAL FACTORS: Age, behavior pattern, education, past/current experiences, transportation, profession  are also affecting patient's functional outcome.  REHAB POTENTIAL: Good CLINICAL DECISION MAKING: Medium  EVALUATION COMPLEXITY: Moderate    PLAN:  PT FREQUENCY: 1-2x/week  PT DURATION: 8 weeks  PLANNED INTERVENTIONS: 97110-Therapeutic exercises, 97530- Therapeutic activity, 97112- Neuromuscular re-education, 97535- Self Care, [id number]- Manual therapy, [phone]- Gait training, Patient/Family education, Balance training, Stair training, Vestibular training, Visual/preceptual remediation/compensation, Cognitive remediation, DME instructions, Cryotherapy, and Moist heat  PLAN FOR NEXT SESSION:   Walking with eyes closed Full MMT assessment of ankles and hips Monitor SPO2% as indicated    1:32 PM, 06/22/24   Lonni KATHEE Gainer, PT 06/22/2024, 1:32 PM

## 2024-06-23 ENCOUNTER — Telehealth: Payer: Self-pay

## 2024-06-23 DIAGNOSIS — J849 Interstitial pulmonary disease, unspecified: Secondary | ICD-10-CM

## 2024-06-23 NOTE — Telephone Encounter (Signed)
 Copied from CRM [id number]. Topic: Clinical - Order For Equipment >> Jun 23, 2024  9:11 AM Isabell A wrote: Reason for CRM: Patient states [employer] didn't receive the order for her oxygen  machine.   Callback number: [phone]   Per 10/13 encounter You need to wear 3 lpm continuous oxygen  when up moving and 2 lpm at rest. We will get you some portable tanks from your medical supply company. When you come back in, we can reassess and see if you can use the [employer] again  Then pt had an ov 10/20 with TP and order was placed for POC.  Referral is closed and notes are Patient had a POC and on 06/08/24 [facility] put her on continuous flow portable tanks.  Pt was re-qualified for POC at ov 10/20 and that order was submitted but looks like they have the notes mixed up.   [facility] do I need to place a new order or can we resend the order from 10/20 with office notes showing the pt did qualify for POC.

## 2024-06-23 NOTE — Telephone Encounter (Signed)
 To avoid any further confusion can we have a new order placed?

## 2024-06-24 ENCOUNTER — Ambulatory Visit: Admitting: Physical Therapy

## 2024-06-24 DIAGNOSIS — R2681 Unsteadiness on feet: Secondary | ICD-10-CM

## 2024-06-24 DIAGNOSIS — R2689 Other abnormalities of gait and mobility: Secondary | ICD-10-CM | POA: Diagnosis not present

## 2024-06-24 NOTE — Telephone Encounter (Signed)
 New order placed.

## 2024-06-24 NOTE — Therapy (Signed)
 OUTPATIENT PHYSICAL THERAPY TREATMENT  Patient Name: Betty [name] MRN: [id number] DOB:[birth year], 78 y.o., female Today's Date: 06/24/2024  PCP: Gretel App, NP REFERRING PROVIDER: Luke Shade, MD   END OF SESSION:  PT End of Session - 06/24/24 1258     Visit Number 3    Number of Visits 16    Date for Recertification  08/04/24    Authorization Type [employer]    Authorization Time Period 06/09/24-08/04/24    Progress Note Due on Visit 10    PT Start Time 1137    PT Stop Time 1217    PT Time Calculation (min) 40 min    Activity Tolerance Patient tolerated treatment well;No increased pain    Behavior During Therapy WFL for tasks assessed/performed           Past Medical History:  Diagnosis Date   Allergic rhinitis    never tested, fall and spring   Arthritis    hands,knees, feet   Cataracts, bilateral    not surgical yet   CHOLECYSTECTOMY, HX OF 10/08/2007   Qualifier: Diagnosis of  By: Bartley MD, Lamar Mulch    Chronic diastolic CHF (congestive heart failure) (HCC)    Chronic respiratory failure (HCC)    Diabetes mellitus without complication (HCC)    Diverticulosis    problems with frequent gas, burping   Glucose intolerance (impaired glucose tolerance)    Gout    History of chicken pox    Hyperlipidemia    Hypertension    NICM (nonischemic cardiomyopathy) (HCC) 2002   EF 25%; improved to normal - echo 4/08: EF 50-55%, mild MR, mild LAE, mild TR;    cath 3/03: normal cors, EF 40%   Obesity    Oxygen  deficiency 2017   at night   PONV (postoperative nausea and vomiting)    after wisdom tooth extraction   Restrictive lung disease    Skin cancer    skin cancers only   Sleep apnea    cpap settings 4   Past Surgical History:  Procedure Laterality Date   BILATERAL VATS ABLATION Right    biopsy done 2015- [facility]   BREAST EXCISIONAL BIOPSY Left 80's   NEG   BREAST SURGERY  80's   small cyst- non-cancerous   CARDIAC CATHETERIZATION     CATARACT  EXTRACTION W/PHACO Right 06/21/2020   Procedure: CATARACT EXTRACTION PHACO AND INTRAOCULAR LENS PLACEMENT (IOC) RIGHT DIABETIC;  Surgeon: Jaye Fallow, MD;  Location: [facility];  Service: Ophthalmology;  Laterality: Right;  4.06 0:32.8   CATARACT EXTRACTION W/PHACO Left 07/12/2020   Procedure: CATARACT EXTRACTION PHACO AND INTRAOCULAR LENS PLACEMENT (IOC) LEFT DIABETIC 6.96 00:46.3;  Surgeon: Jaye Fallow, MD;  Location: [facility];  Service: Ophthalmology;  Laterality: Left;  Diabetic - oral meds   CHOLECYSTECTOMY     choleystectomy  02/2002   COLONOSCOPY WITH PROPOFOL  N/A 12/20/2014   Procedure: COLONOSCOPY WITH PROPOFOL ;  Surgeon: Gordy CHRISTELLA Starch, MD;  Location: [facility];  Service: Gastroenterology;  Laterality: N/A;   COLONOSCOPY WITH PROPOFOL  N/A 03/22/2020   Procedure: COLONOSCOPY WITH PROPOFOL ;  Surgeon: Starch Gordy CHRISTELLA, MD;  Location: [facility];  Service: Gastroenterology;  Laterality: N/A;   CYSTECTOMY  1996   l breast   DILATION AND CURETTAGE OF UTERUS  2011   Dr. Starla at [facility]   INDUCED ABORTION     nsvd     x 2   POLYPECTOMY  03/22/2020   Procedure: POLYPECTOMY;  Surgeon: Starch Gordy CHRISTELLA, MD;  Location: [facility];  Service: Gastroenterology;;   TUBAL LIGATION  1975   VAGINAL DELIVERY     x2   Patient Active Problem List   Diagnosis Date Noted   ILD (interstitial lung disease) (HCC) 06/08/2024   Controlled type 2 diabetes mellitus with diabetic nephropathy, without long-term current use of insulin (HCC) 06/04/2024   Callus 06/04/2024   Balance problem 06/04/2024   Primary hypertension 10/30/2023   Onychomycosis 11/19/2022   Venous insufficiency 05/17/2021   Benign neoplasm of sigmoid colon    Pulmonary HTN (HCC) 10/27/2019   Restrictive lung disease 08/06/2016   Left-sided low back pain with left-sided sciatica 05/01/2016   Encounter for general adult medical examination with abnormal findings 01/27/2016   Benign neoplasm of  ascending colon    Benign neoplasm of transverse colon    Chronic diastolic heart failure (HCC) 04/08/2014   Obesity (BMI 30-39.9) 04/09/2013   Diabetes mellitus type 2, controlled (HCC) 01/07/2013   Chronic hypoxemic respiratory failure (HCC) 11/11/2012   Obstructive sleep apnea 11/11/2012   DOE (dyspnea on exertion) 08/18/2012   GOUT 12/26/2009   ALLERGIC RHINITIS, SEASONAL 10/08/2007   HYPERLIPIDEMIA, MIXED 05/02/2007    ONSET DATE: >1 year, chronic progressive REFERRING DIAG: imbalance  THERAPY DIAG:  No diagnosis found.  Rationale for Evaluation and Treatment: Rehabilitation  SUBJECTIVE:                                                                                                                                                                                             SUBJECTIVE STATEMENT: Pt reports no falls or LOB over the weekend. Pt worked on some Christmas presents over the weekend.   PERTINENT HISTORY:   78yoF who is referred by PCP for progressive changes in balance. Pt reports >2 years, Pt reports history of neuropathy. Pt also functionally limited in exertion by diastolic heart failure diagnosis, attended [facility] in 2023. Pt has ILD, is starting to have O2 delivery to home October 2025, but hesitant to begin using it.   PAIN:  Are you having pain? No; not typically a problem.   PRECAUTIONS: None  WEIGHT BEARING RESTRICTIONS: No  FALLS: Has patient fallen in last 6 months? No (1 fall in shower 3 years, ago, pt says not related to current issues)   LIVING ENVIRONMENT: Lives with: husband Lives in: house  Stairs: 3 step to enter, 1 railing (having a a second installed soon)  Has following equipment at home: [facility] for heart failure reasons, longer diagnoses  PATIENT GOALS:  not sure  OBJECTIVE:  Note: Objective measures were completed at Evaluation unless otherwise noted.  SENSATION: Has  known neuropathy, will see podiatry on 06/10/24 to become  established;   PATIENT SURVEYS:  ABC 58% 5xSTS: hands free: 19 15.64sec : 10.41sec, no device, lateral deviation on return, sway 0.78m/s   30sec eyes closed narrow firm (30s); narrow foam (30s); narrow incline (30sec)                                                                                                                              TREATMENT DATE 06/24/24 :   SPO2 to start was 89%, likely low from walk to clinic, with pursed lip breating improved to 95 to start exercise  -extra time between sets and activities due to low oxygen  sat  TE- To improve strength, endurance, mobility, and function of specific targeted muscle groups or improve joint range of motion or improve muscle flexibility  Nustep for LE and CV training level 2 x 3 min then rest for assessment of SPO2 and 2 min rest then another round of level 2 x 3 min SPO2 drops to 88% after ea 3 min round and recovers to 95 with seated rest and pursed lip breathing   Seated LAQ 3 x 10 with 3# AW - SPO2 monitored and pursep lip breathing between sets as SPO2 89-90 % at end of sets   Seated march x 20 ea side with 3# AW - similar SPO2 response  NMR: To facilitate reeducation of movement, balance, posture, coordination, and/or proprioception/kinesthetic sense.  1 LE on foam pad other on step 3 x 30 sec ea LE   Self care:   Instructed pt in pursed lip breathing to work on improving oxygen  delivery, instructed her to complete these between exercises at home and to keep track of her SP02 during exercise as she has a monitor at home. Pt instructed to rest if SPO2 is below 92%  PATIENT EDUCATION: Education details: Pt will need to consider how she will know that PT is making a functional difference in her life in the ability to fully participate in all meaningful activities.  Person educated:  Education method: Explanation Education comprehension: verbalized understanding  HOME EXERCISE PROGRAM: Access Code: [email or web address] URL:  [email or web address] Date: 06/22/2024 Prepared by: Lonni Gainer  Exercises - Sit to Stand Without Arm Support  - 1 x daily - 5 x weekly - 2 sets - 10 reps - Standing Hip Extension with Counter Support  - 1 x daily - 7 x weekly - 2 sets - 10 reps - Sidestepping near counter   - 1 x daily - 7 x weekly - 2 sets - 10 reps  GOALS: Goals reviewed with patient? No  SHORT TERM GOALS: Target date: 07/10/24  ABC scale score improvement >12% to indicate improved confidence.  Baseline: Goal status: INITIAL  2.  Pt to demonstrate 5xSTS from standard height chair, hands free in <16sec Baseline: <16 sec from 19  Goal status: INITIAL  3.  Pt to report safe and consistent performance of weekly  balance program at home.  Baseline:  Goal status: INITIAL  LONG TERM GOALS: Target date: 08/04/24  Pt to report regular performance in advanced home program to maintain current balance performance and gains long term.  Baseline:  Goal status: INITIAL  2.  ABC Scale improvement > 25% to indicate improved confidence.  Baseline:  Goal status: INITIAL  3.  5xSTS <12 sec hands free from standard chair height.  Baseline:  Goal status: INITIAL  4.  <9.5sec without deviation from line of progression.  Baseline:  Goal status: INITIAL  ASSESSMENT:  CLINICAL IMPRESSION:  Patient arrived with good motivation for completion of pt activities. Pt provided with initial home exercise program focusing on hip strength this date. Pt had to be monitored for SPO2 levels throughout session and was instructed to manage and check these at home with activities. Will continue to progress balance and strength at next visit while monitoring SPO2%. Will provide supplemental oxygen  next session. Pt will continue to benefit from skilled physical therapy intervention to address impairments, improve QOL, and attain therapy goals.   OBJECTIVE IMPAIRMENTS: Decreased knowledge of condition, decreased use  of DME, decreased mobility, difficulty walking, decreased strength, decreased ROM. ACTIVITY LIMITATIONS: Lifting, standing, walking, squatting, transfers, locomotion level PARTICIPATION LIMITATIONS: Cleaning, laundry, interpersonal relationships, driving, yardwork, community activity.  PERSONAL FACTORS: Age, behavior pattern, education, past/current experiences, transportation, profession  are also affecting patient's functional outcome.  REHAB POTENTIAL: Good CLINICAL DECISION MAKING: Medium  EVALUATION COMPLEXITY: Moderate    PLAN:  PT FREQUENCY: 1-2x/week  PT DURATION: 8 weeks  PLANNED INTERVENTIONS: 97110-Therapeutic exercises, 97530- Therapeutic activity, 97112- Neuromuscular re-education, 97535- Self Care, 02859- Manual therapy, [phone]- Gait training, Patient/Family education, Balance training, Stair training, Vestibular training, Visual/preceptual remediation/compensation, Cognitive remediation, DME instructions, Cryotherapy, and Moist heat  PLAN FOR NEXT SESSION:   Walking with eyes closed Full MMT assessment of ankles and hips Monitor SPO2% as indicated  Provide 02 during session, have on standby in case of low SP02.    12:59 PM, 06/24/24   Lonni KATHEE Gainer, PT 06/24/2024, 12:59 PM

## 2024-06-25 NOTE — Telephone Encounter (Signed)
 Order has been sent to [facility]. [facility]N

## 2024-06-29 ENCOUNTER — Ambulatory Visit
Admission: RE | Admit: 2024-06-29 | Discharge: 2024-06-29 | Disposition: A | Discharge status: Home / Self Care | Source: Ambulatory Visit | Attending: Nurse Practitioner | Admitting: Nurse Practitioner

## 2024-06-29 ENCOUNTER — Ambulatory Visit: Admitting: Physical Therapy

## 2024-06-29 DIAGNOSIS — R0609 Other forms of dyspnea: Secondary | ICD-10-CM | POA: Insufficient documentation

## 2024-06-29 DIAGNOSIS — J849 Interstitial pulmonary disease, unspecified: Secondary | ICD-10-CM | POA: Insufficient documentation

## 2024-06-29 DIAGNOSIS — R918 Other nonspecific abnormal finding of lung field: Secondary | ICD-10-CM | POA: Diagnosis not present

## 2024-06-29 DIAGNOSIS — I2510 Atherosclerotic heart disease of native coronary artery without angina pectoris: Secondary | ICD-10-CM | POA: Diagnosis not present

## 2024-06-29 DIAGNOSIS — R2681 Unsteadiness on feet: Secondary | ICD-10-CM | POA: Diagnosis not present

## 2024-06-29 DIAGNOSIS — J984 Other disorders of lung: Secondary | ICD-10-CM | POA: Insufficient documentation

## 2024-06-29 DIAGNOSIS — I700 Atherosclerosis of aorta: Secondary | ICD-10-CM | POA: Diagnosis not present

## 2024-06-29 NOTE — Therapy (Signed)
 OUTPATIENT PHYSICAL THERAPY TREATMENT  Patient Name: Betty [name] MRN: [id number] DOB:July 21, 1946, 78 y.o., female Today's Date: 06/29/2024  PCP: Gretel App, NP REFERRING PROVIDER: Luke Shade, MD   END OF SESSION:   PT End of Session - 06/29/24 0936     Visit Number 4    Number of Visits 16    Date for Recertification  08/04/24    Authorization Type [employer]    Authorization Time Period 06/09/24-08/04/24    Progress Note Due on Visit 10    PT Start Time 0936    PT Stop Time 1018    PT Time Calculation (min) 42 min    Equipment Utilized During Treatment Gait belt    Activity Tolerance Patient tolerated treatment well;No increased pain    Behavior During Therapy WFL for tasks assessed/performed            Past Medical History:  Diagnosis Date   Allergic rhinitis    never tested, fall and spring   Arthritis    hands,knees, feet   Cataracts, bilateral    not surgical yet   CHOLECYSTECTOMY, HX OF 10/08/2007   Qualifier: Diagnosis of  By: Bartley MD, Lamar Mulch    Chronic diastolic CHF (congestive heart failure) (HCC)    Chronic respiratory failure (HCC)    Diabetes mellitus without complication (HCC)    Diverticulosis    problems with frequent gas, burping   Glucose intolerance (impaired glucose tolerance)    Gout    History of chicken pox    Hyperlipidemia    Hypertension    NICM (nonischemic cardiomyopathy) (HCC) 2002   EF 25%; improved to normal - echo 4/08: EF 50-55%, mild MR, mild LAE, mild TR;    cath 3/03: normal cors, EF 40%   Obesity    Oxygen  deficiency 2017   at night   PONV (postoperative nausea and vomiting)    after wisdom tooth extraction   Restrictive lung disease    Skin cancer    skin cancers only   Sleep apnea    cpap settings 4   Past Surgical History:  Procedure Laterality Date   BILATERAL VATS ABLATION Right    biopsy done 2015- [facility]   BREAST EXCISIONAL BIOPSY Left 80's   NEG   BREAST SURGERY  80's   small cyst-  non-cancerous   CARDIAC CATHETERIZATION     CATARACT EXTRACTION W/PHACO Right 06/21/2020   Procedure: CATARACT EXTRACTION PHACO AND INTRAOCULAR LENS PLACEMENT (IOC) RIGHT DIABETIC;  Surgeon: Jaye Fallow, MD;  Location: [facility];  Service: Ophthalmology;  Laterality: Right;  4.06 0:32.8   CATARACT EXTRACTION W/PHACO Left 07/12/2020   Procedure: CATARACT EXTRACTION PHACO AND INTRAOCULAR LENS PLACEMENT (IOC) LEFT DIABETIC 6.96 00:46.3;  Surgeon: Jaye Fallow, MD;  Location: [facility];  Service: Ophthalmology;  Laterality: Left;  Diabetic - oral meds   CHOLECYSTECTOMY     choleystectomy  02/2002   COLONOSCOPY WITH PROPOFOL  N/A 12/20/2014   Procedure: COLONOSCOPY WITH PROPOFOL ;  Surgeon: Gordy CHRISTELLA Starch, MD;  Location: [facility];  Service: Gastroenterology;  Laterality: N/A;   COLONOSCOPY WITH PROPOFOL  N/A 03/22/2020   Procedure: COLONOSCOPY WITH PROPOFOL ;  Surgeon: Starch Gordy CHRISTELLA, MD;  Location: [facility];  Service: Gastroenterology;  Laterality: N/A;   CYSTECTOMY  1996   l breast   DILATION AND CURETTAGE OF UTERUS  2011   Dr. Starla [facility]   INDUCED ABORTION     nsvd     x 2   POLYPECTOMY  03/22/2020   Procedure: POLYPECTOMY;  Surgeon: Albertus Gordy HERO, MD;  Location: [facility];  Service: Gastroenterology;;   TUBAL LIGATION  1975   VAGINAL DELIVERY     x2   Patient Active Problem List   Diagnosis Date Noted   ILD (interstitial lung disease) (HCC) 06/08/2024   Controlled type 2 diabetes mellitus with diabetic nephropathy, without long-term current use of insulin (HCC) 06/04/2024   Callus 06/04/2024   Balance problem 06/04/2024   Primary hypertension 10/30/2023   Onychomycosis 11/19/2022   Venous insufficiency 05/17/2021   Benign neoplasm of sigmoid colon    Pulmonary HTN (HCC) 10/27/2019   Restrictive lung disease 08/06/2016   Left-sided low back pain with left-sided sciatica 05/01/2016   Encounter for general adult medical examination  with abnormal findings 01/27/2016   Benign neoplasm of ascending colon    Benign neoplasm of transverse colon    Chronic diastolic heart failure (HCC) 04/08/2014   Obesity (BMI 30-39.9) 04/09/2013   Diabetes mellitus type 2, controlled (HCC) 01/07/2013   Chronic hypoxemic respiratory failure (HCC) 11/11/2012   Obstructive sleep apnea 11/11/2012   DOE (dyspnea on exertion) 08/18/2012   GOUT 12/26/2009   ALLERGIC RHINITIS, SEASONAL 10/08/2007   HYPERLIPIDEMIA, MIXED 05/02/2007    ONSET DATE: >1 year, chronic progressive REFERRING DIAG: imbalance  THERAPY DIAG:  Unsteadiness on feet  Rationale for Evaluation and Treatment: Rehabilitation  SUBJECTIVE:                                                                                                                                                                                             SUBJECTIVE STATEMENT:  Pt reports she has been doing okay. Pt reports that she has been trying to get Christmas shopping & wrapping taken care of, as her husband is getting a hip replacement on 08/03/24.   Pt reports no stumbles or falls over the weekend. Pt denies any additional updates.   Pt bringing nasal cannula for therapy use.    Pt stating that she has been using 2L at home; using portable tank at home. Encouraged pt to reach out to pulmonologist regarding O2 equipment recommendation.    PERTINENT HISTORY:   78yoF who is referred by PCP for progressive changes in balance. Pt reports >2 years, Pt reports history of neuropathy. Pt also functionally limited in exertion by diastolic heart failure diagnosis, attended [facility] in 2023. Pt has ILD, is starting to have O2 delivery to home October 2025, but hesitant to begin using it.   Per pulmonology note 06/15/24: Chronic respiratory failure with exertional hypoxemia. Walk test today in office shows patient qualifies for pulsing  oxygen  order for POC has been sent to DME. Continue on oxygen  at 3 to 4 L  to maintain O2 saturations greater than 90%. Patient is encouraged to use oxygen  with activity to maintain O2 saturations greater than 90%    PAIN:  Are you having pain? No; not typically a problem.   PRECAUTIONS: Continuous 3L O2 via [place]  WEIGHT BEARING RESTRICTIONS: No  FALLS: Has patient fallen in last 6 months? No (1 fall in shower 3 years, ago, pt says not related to current issues)   LIVING ENVIRONMENT: Lives with: husband Lives in: house  Stairs: 3 step to enter, 1 railing (having a a second installed soon)  Has following equipment at home: [facility] for heart failure reasons, longer diagnoses  PATIENT GOALS:  not sure  OBJECTIVE:  Note: Objective measures were completed at Evaluation unless otherwise noted.  SENSATION: Has known neuropathy, will see podiatry on 06/10/24 to become established;   PATIENT SURVEYS:  ABC 58% 5xSTS: hands free: 19 15.64sec : 10.41sec, no device, lateral deviation on return, sway 0.53m/s   30sec eyes closed narrow firm (30s); narrow foam (30s); narrow incline (30sec)                                                                                                                              TREATMENT DATE 06/29/24 :    Spo2 on 3L following arrival to session: 98%. Continued to provide 3L of O2 throughout session via [place].   10x STS from green chair without UE support; SpO2: 95% Pt reporting that exercise felt challenging from both strength & breathing standpoint verbal cueing for increased anterior lean when coming to stand & sit, as well as improved eccentric control   8x STS from green chair without UE support;SpO2: 97%  Pt continuing to describe as difficult from breathing & strength standpoint Pt with improved anterior lean with decreased verbal cueing    Gait without use of AD, total of 3 laps at increased gait speed Pt describing as challenging from breathing aspect; 95% SpO2  Completed following exercises & added as part of HEP in  order to decrease sedentary time:  Seated march 1x10 each LE Seated LAQ 1x10 each LE Hip AD into towel roll 1x10 Hip AB with RTB 1x10  Pt educated on completing set of exercises as part of routine, I.e. during commercial breaks/at start of new episodes while watching TV for seated exercises, side stepping & hip extension while brushing teeth, doing dishes, etc.  Pt educated taht she could also complete strengthening exercises as part of HEP every other day as needed if increased soreness   Throughout session, also provided education on benefit of continuing constant flow 3L O2 via [place] per pulmonologist recommendation. Pt to follow up with pulmonologist on options of portable unit vs O2 tank vs other options for increased adherence.      PATIENT EDUCATION: Education details: Pt will need to consider how she will  know that PT is making a functional difference in her life in the ability to fully participate in all meaningful activities.  Person educated:  Education method: Explanation Education comprehension: verbalized understanding  HOME EXERCISE PROGRAM: Access Code: [email or web address] URL: [email or web address] Date: 06/29/2024 Prepared by: Chiquita Silvan  Exercises - Sit to Stand Without Arm Support  - 1 x daily - 5 x weekly - 2 sets - 10 reps - Standing Hip Extension with Counter Support  - 1 x daily - 7 x weekly - 2 sets - 10 reps - Sidestepping near counter   - 1 x daily - 7 x weekly - 2 sets - 10 reps - Seated March  - 1 x daily - 7 x weekly - 3 sets - 10 reps - Seated Long Arc Quad  - 1 x daily - 7 x weekly - 3 sets - 10 reps - Seated Hip Adduction Isometrics with Ball  - 1 x daily - 7 x weekly - 3 sets - 10 reps - 2-3 sec hold - Seated Hip Abduction with Resistance  - 1 x daily - 7 x weekly - 3 sets - 10 reps   GOALS: Goals reviewed with patient? No  SHORT TERM GOALS: Target date: 07/10/24  ABC scale score improvement >12% to indicate improved confidence.   Baseline: Goal status: INITIAL  2.  Pt to demonstrate 5xSTS from standard height chair, hands free in <16sec Baseline: <16 sec from 19  Goal status: INITIAL  3.  Pt to report safe and consistent performance of weekly balance program at home.  Baseline:  Goal status: INITIAL  LONG TERM GOALS: Target date: 08/04/24  Pt to report regular performance in advanced home program to maintain current balance performance and gains long term.  Baseline:  Goal status: INITIAL  2.  ABC Scale improvement > 25% to indicate improved confidence.  Baseline:  Goal status: INITIAL  3.  5xSTS <12 sec hands free from standard chair height.  Baseline:  Goal status: INITIAL  4.  <9.5sec without deviation from line of progression.  Baseline:  Goal status: INITIAL  ASSESSMENT:  CLINICAL IMPRESSION: Patient arrived with good motivation for completion of pt activities. Pt educated on pulmonologist recommendation per note on 06/15/24 to utilize 3L continuous O2 via [place]; O2 monitored throughout session. Pt reporting that she is unsure of use of portable tank options vs use of O2 tanks; pt to follow up with pulmonologist for recommendation. Pt reporting that STS felt challenging for both breathing & strengthening aspects. Pt reporting feeling most challenged by breathing during gait with increased speed. Pt reporting that she is not very active throughout her day, so added seated exercises to HEP to break up sedentary time while watching TV. Also provided education on benefit of adding HEP into daily routine. Pt's SpO2 >95% throughout session on 3L O2 via [place]. Pt will continue to benefit from skilled physical therapy intervention to address impairments, improve QOL, and attain therapy goals.   OBJECTIVE IMPAIRMENTS: Decreased knowledge of condition, decreased use of DME, decreased mobility, difficulty walking, decreased strength, decreased ROM. ACTIVITY LIMITATIONS: Lifting, standing, walking, squatting,  transfers, locomotion level PARTICIPATION LIMITATIONS: Cleaning, laundry, interpersonal relationships, driving, yardwork, community activity.  PERSONAL FACTORS: Age, behavior pattern, education, past/current experiences, transportation, profession  are also affecting patient's functional outcome.  REHAB POTENTIAL: Good CLINICAL DECISION MAKING: Medium  EVALUATION COMPLEXITY: Moderate    PLAN:  PT FREQUENCY: 1-2x/week  PT DURATION: 8 weeks  PLANNED INTERVENTIONS: 97110-Therapeutic exercises, 97530- Therapeutic activity,  02887- Neuromuscular re-education, [phone]- Self Care, [id number]- Manual therapy, [phone]- Gait training, Patient/Family education, Balance training, Stair training, Vestibular training, Visual/preceptual remediation/compensation, Cognitive remediation, DME instructions, Cryotherapy, and Moist heat  PLAN FOR NEXT SESSION:  Provide O2 during session - 3L via [place] Walking with eyes closed Full MMT assessment of ankles and hips Monitor SPO2% as indicated     11:45 AM, 06/29/24  Chiquita Silvan, SPT Physical Therapy Student - [facility]  [place], Student-PT 06/29/2024, 11:45 AM

## 2024-06-30 ENCOUNTER — Ambulatory Visit: Admitting: Physical Therapy

## 2024-06-30 ENCOUNTER — Ambulatory Visit (INDEPENDENT_AMBULATORY_CARE_PROVIDER_SITE_OTHER)

## 2024-06-30 DIAGNOSIS — J849 Interstitial pulmonary disease, unspecified: Secondary | ICD-10-CM | POA: Diagnosis not present

## 2024-06-30 DIAGNOSIS — R0609 Other forms of dyspnea: Secondary | ICD-10-CM

## 2024-06-30 DIAGNOSIS — J984 Other disorders of lung: Secondary | ICD-10-CM

## 2024-06-30 LAB — PULMONARY FUNCTION TEST
DL/VA % pred: 124 %
DL/VA: 5.06 ml/min/mmHg/L
DLCO cor % pred: 57 %
DLCO cor: 11.31 ml/min/mmHg
DLCO unc % pred: 59 %
DLCO unc: 11.67 ml/min/mmHg
FEF 25-75 Post: 1.93 L/s
FEF 25-75 Pre: 1.6 L/s
FEF2575-%Change-Post: 21 %
FEF2575-%Pred-Post: 119 %
FEF2575-%Pred-Pre: 98 %
FEV1-%Change-Post: 5 %
FEV1-%Pred-Post: 54 %
FEV1-%Pred-Pre: 51 %
FEV1-Post: 1.16 L
FEV1-Pre: 1.1 L
FEV1FVC-%Change-Post: -1 %
FEV1FVC-%Pred-Pre: 118 %
FEV6-%Change-Post: 6 %
FEV6-%Pred-Post: 48 %
FEV6-%Pred-Pre: 45 %
FEV6-Post: 1.32 L
FEV6-Pre: 1.25 L
FEV6FVC-%Pred-Post: 105 %
FEV6FVC-%Pred-Pre: 105 %
FVC-%Change-Post: 6 %
FVC-%Pred-Post: 46 %
FVC-%Pred-Pre: 43 %
FVC-Post: 1.32 L
FVC-Pre: 1.25 L
Post FEV1/FVC ratio: 87 %
Post FEV6/FVC ratio: 100 %
Pre FEV1/FVC ratio: 88 %
Pre FEV6/FVC Ratio: 100 %
RV % pred: 141 %
RV: 3.37 L
TLC % pred: 88 %
TLC: 4.63 L

## 2024-06-30 NOTE — Patient Instructions (Signed)
 Full PFT performed today.

## 2024-06-30 NOTE — Progress Notes (Signed)
 Full PFT performed today.

## 2024-07-02 ENCOUNTER — Ambulatory Visit: Admitting: Physical Therapy

## 2024-07-02 DIAGNOSIS — R0609 Other forms of dyspnea: Secondary | ICD-10-CM | POA: Diagnosis not present

## 2024-07-02 DIAGNOSIS — R2681 Unsteadiness on feet: Secondary | ICD-10-CM

## 2024-07-02 DIAGNOSIS — J849 Interstitial pulmonary disease, unspecified: Secondary | ICD-10-CM | POA: Diagnosis not present

## 2024-07-02 DIAGNOSIS — I700 Atherosclerosis of aorta: Secondary | ICD-10-CM | POA: Diagnosis not present

## 2024-07-02 DIAGNOSIS — I2510 Atherosclerotic heart disease of native coronary artery without angina pectoris: Secondary | ICD-10-CM | POA: Diagnosis not present

## 2024-07-02 DIAGNOSIS — J984 Other disorders of lung: Secondary | ICD-10-CM | POA: Diagnosis not present

## 2024-07-02 NOTE — Therapy (Signed)
 OUTPATIENT PHYSICAL THERAPY TREATMENT  Patient Name: Betty [name] MRN: [id number] DOB:August 18, 1946, 78 y.o., female Today's Date: 07/02/2024  PCP: Gretel App, NP REFERRING PROVIDER: Luke Shade, MD   END OF SESSION:   PT End of Session - 07/02/24 0920     Visit Number 5    Number of Visits 16    Date for Recertification  08/04/24    Authorization Type [employer]    Authorization Time Period 06/09/24-08/04/24    Progress Note Due on Visit 10    PT Start Time 0932    PT Stop Time 1012    PT Time Calculation (min) 40 min    Equipment Utilized During Treatment Gait belt    Activity Tolerance Patient tolerated treatment well;No increased pain    Behavior During Therapy WFL for tasks assessed/performed             Past Medical History:  Diagnosis Date   Allergic rhinitis    never tested, fall and spring   Arthritis    hands,knees, feet   Cataracts, bilateral    not surgical yet   CHOLECYSTECTOMY, HX OF 10/08/2007   Qualifier: Diagnosis of  By: Bartley MD, Lamar Mulch    Chronic diastolic CHF (congestive heart failure) (HCC)    Chronic respiratory failure (HCC)    Diabetes mellitus without complication (HCC)    Diverticulosis    problems with frequent gas, burping   Glucose intolerance (impaired glucose tolerance)    Gout    History of chicken pox    Hyperlipidemia    Hypertension    NICM (nonischemic cardiomyopathy) (HCC) 2002   EF 25%; improved to normal - echo 4/08: EF 50-55%, mild MR, mild LAE, mild TR;    cath 3/03: normal cors, EF 40%   Obesity    Oxygen  deficiency 2017   at night   PONV (postoperative nausea and vomiting)    after wisdom tooth extraction   Restrictive lung disease    Skin cancer    skin cancers only   Sleep apnea    cpap settings 4   Past Surgical History:  Procedure Laterality Date   BILATERAL VATS ABLATION Right    biopsy done 2015- [facility]   BREAST EXCISIONAL BIOPSY Left 80's   NEG   BREAST SURGERY  80's   small cyst-  non-cancerous   CARDIAC CATHETERIZATION     CATARACT EXTRACTION W/PHACO Right 06/21/2020   Procedure: CATARACT EXTRACTION PHACO AND INTRAOCULAR LENS PLACEMENT (IOC) RIGHT DIABETIC;  Surgeon: Jaye Fallow, MD;  Location: [facility];  Service: Ophthalmology;  Laterality: Right;  4.06 0:32.8   CATARACT EXTRACTION W/PHACO Left 07/12/2020   Procedure: CATARACT EXTRACTION PHACO AND INTRAOCULAR LENS PLACEMENT (IOC) LEFT DIABETIC 6.96 00:46.3;  Surgeon: Jaye Fallow, MD;  Location: [facility];  Service: Ophthalmology;  Laterality: Left;  Diabetic - oral meds   CHOLECYSTECTOMY     choleystectomy  02/2002   COLONOSCOPY WITH PROPOFOL  N/A 12/20/2014   Procedure: COLONOSCOPY WITH PROPOFOL ;  Surgeon: Gordy CHRISTELLA Starch, MD;  Location: [facility];  Service: Gastroenterology;  Laterality: N/A;   COLONOSCOPY WITH PROPOFOL  N/A 03/22/2020   Procedure: COLONOSCOPY WITH PROPOFOL ;  Surgeon: Starch Gordy CHRISTELLA, MD;  Location: [facility];  Service: Gastroenterology;  Laterality: N/A;   CYSTECTOMY  1996   l breast   DILATION AND CURETTAGE OF UTERUS  2011   Dr. Starla at [facility]   INDUCED ABORTION     nsvd     x 2   POLYPECTOMY  03/22/2020   Procedure: POLYPECTOMY;  Surgeon: Albertus Gordy HERO, MD;  Location: [facility];  Service: Gastroenterology;;   TUBAL LIGATION  1975   VAGINAL DELIVERY     x2   Patient Active Problem List   Diagnosis Date Noted   ILD (interstitial lung disease) (HCC) 06/08/2024   Controlled type 2 diabetes mellitus with diabetic nephropathy, without long-term current use of insulin (HCC) 06/04/2024   Callus 06/04/2024   Balance problem 06/04/2024   Primary hypertension 10/30/2023   Onychomycosis 11/19/2022   Venous insufficiency 05/17/2021   Benign neoplasm of sigmoid colon    Pulmonary HTN (HCC) 10/27/2019   Restrictive lung disease 08/06/2016   Left-sided low back pain with left-sided sciatica 05/01/2016   Encounter for general adult medical examination  with abnormal findings 01/27/2016   Benign neoplasm of ascending colon    Benign neoplasm of transverse colon    Chronic diastolic heart failure (HCC) 04/08/2014   Obesity (BMI 30-39.9) 04/09/2013   Diabetes mellitus type 2, controlled (HCC) 01/07/2013   Chronic hypoxemic respiratory failure (HCC) 11/11/2012   Obstructive sleep apnea 11/11/2012   DOE (dyspnea on exertion) 08/18/2012   GOUT 12/26/2009   ALLERGIC RHINITIS, SEASONAL 10/08/2007   HYPERLIPIDEMIA, MIXED 05/02/2007    ONSET DATE: >1 year, chronic progressive REFERRING DIAG: imbalance  THERAPY DIAG:  No diagnosis found.  Rationale for Evaluation and Treatment: Rehabilitation  SUBJECTIVE:                                                                                                                                                                                             SUBJECTIVE STATEMENT:   Pt reports no changes since last session.    PERTINENT HISTORY:   78yoF who is referred by PCP for progressive changes in balance. Pt reports >2 years, Pt reports history of neuropathy. Pt also functionally limited in exertion by diastolic heart failure diagnosis, attended [facility] in 2023. Pt has ILD, is starting to have O2 delivery to home October 2025, but hesitant to begin using it.   Per pulmonology note 06/15/24: Chronic respiratory failure with exertional hypoxemia. Walk test today in office shows patient qualifies for pulsing oxygen  order for POC has been sent to DME. Continue on oxygen  at 3 to 4 L to maintain O2 saturations greater than 90%. Patient is encouraged to use oxygen  with activity to maintain O2 saturations greater than 90%    PAIN:  Are you having pain? No; not typically a problem.   PRECAUTIONS: Continuous 3L O2 via [place]  WEIGHT BEARING RESTRICTIONS: No  FALLS: Has patient fallen in last 6 months? No (1 fall in  shower 3 years, ago, pt says not related to current issues)   LIVING ENVIRONMENT: Lives  with: husband Lives in: house  Stairs: 3 step to enter, 1 railing (having a a second installed soon)  Has following equipment at home: [facility] for heart failure reasons, longer diagnoses  PATIENT GOALS:  not sure  OBJECTIVE:  Note: Objective measures were completed at Evaluation unless otherwise noted.  SENSATION: Has known neuropathy, will see podiatry on 06/10/24 to become established;   PATIENT SURVEYS:  ABC 58% 5xSTS: hands free: 19 15.64sec : 10.41sec, no device, lateral deviation on return, sway 0.71m/s   30sec eyes closed narrow firm (30s); narrow foam (30s); narrow incline (30sec)                                                                                                                              TREATMENT DATE 07/02/24 :    Spo2 on 3L following arrival to session. Continued to provide 3L of O2 throughout session via [place].   TA- To improve functional movements patterns for everyday tasks    Nustep level 3-7 x 8 min - step at 4 min SPO2 at 92-93 so kept going with min rest   Heel raises on incline 3 x 12 reps   NMR: To facilitate reeducation of movement, balance, posture, coordination, and/or proprioception/kinesthetic sense.  Side step over 1/2 foam x 10 ea   -easy so added airex pad for stance LE 2 x 10 ea SPO2 93% and seated rest provided up to 95% in 2 min  TE- To improve strength, endurance, mobility, and function of specific targeted muscle groups or improve joint range of motion or improve muscle flexibility  Seated LAQ 2 x 10 ea LE with 4# AW   TA-   10x STS from green chair without UE support  NMR: To facilitate reeducation of movement, balance, posture, coordination, and/or proprioception/kinesthetic sense.' Step forward on airex over 1/2 foam roller x 10 ea with UE assist due to low back discomfort on rep 1   Throughout session, also provided education on benefit of continuing constant flow 3L O2 via [place] per pulmonologist recommendation. Pt to  follow up with pulmonologist on options of portable unit vs O2 tank vs other options for increased adherence.      PATIENT EDUCATION: Education details: Pt will need to consider how she will know that PT is making a functional difference in her life in the ability to fully participate in all meaningful activities.  Person educated:  Education method: Explanation Education comprehension: verbalized understanding  HOME EXERCISE PROGRAM: Access Code: [email or web address] URL: [email or web address] Date: 06/29/2024 Prepared by: Chiquita Silvan  Exercises - Sit to Stand Without Arm Support  - 1 x daily - 5 x weekly - 2 sets - 10 reps - Standing Hip Extension with Counter Support  - 1 x daily - 7 x weekly - 2 sets - 10 reps - Sidestepping near counter   -  1 x daily - 7 x weekly - 2 sets - 10 reps - Seated March  - 1 x daily - 7 x weekly - 3 sets - 10 reps - Seated Long Arc Quad  - 1 x daily - 7 x weekly - 3 sets - 10 reps - Seated Hip Adduction Isometrics with Ball  - 1 x daily - 7 x weekly - 3 sets - 10 reps - 2-3 sec hold - Seated Hip Abduction with Resistance  - 1 x daily - 7 x weekly - 3 sets - 10 reps   GOALS: Goals reviewed with patient? No  SHORT TERM GOALS: Target date: 07/10/24  ABC scale score improvement >12% to indicate improved confidence.  Baseline: Goal status: INITIAL  2.  Pt to demonstrate 5xSTS from standard height chair, hands free in <16sec Baseline: <16 sec from 19  Goal status: INITIAL  3.  Pt to report safe and consistent performance of weekly balance program at home.  Baseline:  Goal status: INITIAL  LONG TERM GOALS: Target date: 08/04/24  Pt to report regular performance in advanced home program to maintain current balance performance and gains long term.  Baseline:  Goal status: INITIAL  2.  ABC Scale improvement > 25% to indicate improved confidence.  Baseline:  Goal status: INITIAL  3.  5xSTS <12 sec hands free from standard chair height.   Baseline:  Goal status: INITIAL  4.  <9.5sec without deviation from line of progression.  Baseline:  Goal status: INITIAL  ASSESSMENT:  CLINICAL IMPRESSION:  Patient arrived with good motivation for completion of pt activities.  Pt shows marked improvement with all activities using oxygen  supplementally compared to last visit with this PT. Encouraged to use oxygen  for safety and because it makes her more functional. Will continue to progress with future sessions. Pt will continue to benefit from skilled physical therapy intervention to address impairments, improve QOL, and attain therapy goals.     OBJECTIVE IMPAIRMENTS: Decreased knowledge of condition, decreased use of DME, decreased mobility, difficulty walking, decreased strength, decreased ROM. ACTIVITY LIMITATIONS: Lifting, standing, walking, squatting, transfers, locomotion level PARTICIPATION LIMITATIONS: Cleaning, laundry, interpersonal relationships, driving, yardwork, community activity.  PERSONAL FACTORS: Age, behavior pattern, education, past/current experiences, transportation, profession  are also affecting patient's functional outcome.  REHAB POTENTIAL: Good CLINICAL DECISION MAKING: Medium  EVALUATION COMPLEXITY: Moderate    PLAN:  PT FREQUENCY: 1-2x/week  PT DURATION: 8 weeks  PLANNED INTERVENTIONS: 97110-Therapeutic exercises, 97530- Therapeutic activity, 97112- Neuromuscular re-education, 97535- Self Care, [id number]- Manual therapy, [phone]- Gait training, Patient/Family education, Balance training, Stair training, Vestibular training, Visual/preceptual remediation/compensation, Cognitive remediation, DME instructions, Cryotherapy, and Moist heat  PLAN FOR NEXT SESSION:  Provide O2 during session - 3L via [place] Walking with eyes closed Full MMT assessment of ankles and hips Monitor SPO2% as indicated     9:39 AM, 07/02/24   Lonni KATHEE Gainer, PT 07/02/2024, 9:39 AM

## 2024-07-03 NOTE — Progress Notes (Signed)
 HRCT chest without significant ILD/IPF. Very mild ILA. Chronic elevation of the right hemidiaphragm. She does have an enlarged pulmonary artery. She also has a reduced gas exchange on her lung function testing. Given this, I am concerned she could have pulmonary hypertension contributing to her chronic respiratory failure. I have ordered an echocardiogram for further evaluation. This is an estimate and sometimes, we end up having to do a cardiac catheterization procedure instead, but we will start with the echo to see if they can measure her PA pressures. Keep appt with Dr. Fayetta. Thanks.

## 2024-07-06 ENCOUNTER — Ambulatory Visit

## 2024-07-06 ENCOUNTER — Telehealth: Payer: Self-pay | Admitting: Emergency Medicine

## 2024-07-06 DIAGNOSIS — J849 Interstitial pulmonary disease, unspecified: Secondary | ICD-10-CM | POA: Diagnosis not present

## 2024-07-06 DIAGNOSIS — R2681 Unsteadiness on feet: Secondary | ICD-10-CM | POA: Diagnosis not present

## 2024-07-06 DIAGNOSIS — J984 Other disorders of lung: Secondary | ICD-10-CM | POA: Diagnosis not present

## 2024-07-06 DIAGNOSIS — I2510 Atherosclerotic heart disease of native coronary artery without angina pectoris: Secondary | ICD-10-CM | POA: Diagnosis not present

## 2024-07-06 DIAGNOSIS — I700 Atherosclerosis of aorta: Secondary | ICD-10-CM | POA: Diagnosis not present

## 2024-07-06 DIAGNOSIS — R0609 Other forms of dyspnea: Secondary | ICD-10-CM | POA: Diagnosis not present

## 2024-07-06 NOTE — Telephone Encounter (Signed)
 I found slot Dec 5 at 9:30 and blocked- you can use this one. Thanks.

## 2024-07-06 NOTE — Telephone Encounter (Signed)
 Copied from CRM [id number]. Topic: Appointments - Scheduling Inquiry for Clinic >> Jul 03, 2024  4:02 PM Celestine FALCON wrote: Reason for CRM: Pt is scheduled with Dr. Shelah for 09/05/2023, but she prefers to be seen sooner if possible. Pt's appt is on the wait list, and she state she received a call regarding her appt with a brief vm.   I tried to help reschedule for a sooner appt, but there's no sooner appts available. Pt's phone number is [phone] ok to leave a vm or [phone] ok to leave a vm.  PT wants a APT before the year ends. Dr.[name] has no open slots as of now. Is there a slot I can offer the PT. The PT is on the waiting list as well. Pleas advise.

## 2024-07-06 NOTE — Therapy (Signed)
 OUTPATIENT PHYSICAL THERAPY TREATMENT  Patient Name: Betty [name] MRN: [id number] DOB:[birth year], 78 y.o., female Today's Date: 07/06/2024  PCP: Gretel App, NP REFERRING PROVIDER: Luke Shade, MD   END OF SESSION:   PT End of Session - 07/06/24 1027     Visit Number 6    Number of Visits 16    Date for Recertification  08/04/24    Authorization Type [employer]    Authorization Time Period 06/09/24-08/04/24    Progress Note Due on Visit 10    PT Start Time 1018    PT Stop Time 1058    PT Time Calculation (min) 40 min    Equipment Utilized During Treatment Gait belt    Activity Tolerance Patient tolerated treatment well;No increased pain    Behavior During Therapy WFL for tasks assessed/performed             Past Medical History:  Diagnosis Date   Allergic rhinitis    never tested, fall and spring   Arthritis    hands,knees, feet   Cataracts, bilateral    not surgical yet   CHOLECYSTECTOMY, HX OF 10/08/2007   Qualifier: Diagnosis of  By: Bartley MD, Lamar Mulch    Chronic diastolic CHF (congestive heart failure) (HCC)    Chronic respiratory failure (HCC)    Diabetes mellitus without complication (HCC)    Diverticulosis    problems with frequent gas, burping   Glucose intolerance (impaired glucose tolerance)    Gout    History of chicken pox    Hyperlipidemia    Hypertension    NICM (nonischemic cardiomyopathy) (HCC) 2002   EF 25%; improved to normal - echo 4/08: EF 50-55%, mild MR, mild LAE, mild TR;    cath 3/03: normal cors, EF 40%   Obesity    Oxygen  deficiency 2017   at night   PONV (postoperative nausea and vomiting)    after wisdom tooth extraction   Restrictive lung disease    Skin cancer    skin cancers only   Sleep apnea    cpap settings 4   Past Surgical History:  Procedure Laterality Date   BILATERAL VATS ABLATION Right    biopsy done 2015- [facility]   BREAST EXCISIONAL BIOPSY Left 80's   NEG   BREAST SURGERY  80's   small cyst-  non-cancerous   CARDIAC CATHETERIZATION     CATARACT EXTRACTION W/PHACO Right 06/21/2020   Procedure: CATARACT EXTRACTION PHACO AND INTRAOCULAR LENS PLACEMENT (IOC) RIGHT DIABETIC;  Surgeon: Jaye Fallow, MD;  Location: [facility];  Service: Ophthalmology;  Laterality: Right;  4.06 0:32.8   CATARACT EXTRACTION W/PHACO Left 07/12/2020   Procedure: CATARACT EXTRACTION PHACO AND INTRAOCULAR LENS PLACEMENT (IOC) LEFT DIABETIC 6.96 00:46.3;  Surgeon: Jaye Fallow, MD;  Location: [facility];  Service: Ophthalmology;  Laterality: Left;  Diabetic - oral meds   CHOLECYSTECTOMY     choleystectomy  02/2002   COLONOSCOPY WITH PROPOFOL  N/A 12/20/2014   Procedure: COLONOSCOPY WITH PROPOFOL ;  Surgeon: Gordy CHRISTELLA Starch, MD;  Location: [facility];  Service: Gastroenterology;  Laterality: N/A;   COLONOSCOPY WITH PROPOFOL  N/A 03/22/2020   Procedure: COLONOSCOPY WITH PROPOFOL ;  Surgeon: Starch Gordy CHRISTELLA, MD;  Location: [facility];  Service: Gastroenterology;  Laterality: N/A;   CYSTECTOMY  1996   l breast   DILATION AND CURETTAGE OF UTERUS  2011   Dr. Starla at [facility]   INDUCED ABORTION     nsvd     x 2   POLYPECTOMY  03/22/2020   Procedure: POLYPECTOMY;  Surgeon: Albertus Gordy HERO, MD;  Location: [facility];  Service: Gastroenterology;;   TUBAL LIGATION  1975   VAGINAL DELIVERY     x2   Patient Active Problem List   Diagnosis Date Noted   ILD (interstitial lung disease) (HCC) 06/08/2024   Controlled type 2 diabetes mellitus with diabetic nephropathy, without long-term current use of insulin (HCC) 06/04/2024   Callus 06/04/2024   Balance problem 06/04/2024   Primary hypertension 10/30/2023   Onychomycosis 11/19/2022   Venous insufficiency 05/17/2021   Benign neoplasm of sigmoid colon    Pulmonary HTN (HCC) 10/27/2019   Restrictive lung disease 08/06/2016   Left-sided low back pain with left-sided sciatica 05/01/2016   Encounter for general adult medical examination  with abnormal findings 01/27/2016   Benign neoplasm of ascending colon    Benign neoplasm of transverse colon    Chronic diastolic heart failure (HCC) 04/08/2014   Obesity (BMI 30-39.9) 04/09/2013   Diabetes mellitus type 2, controlled (HCC) 01/07/2013   Chronic hypoxemic respiratory failure (HCC) 11/11/2012   Obstructive sleep apnea 11/11/2012   DOE (dyspnea on exertion) 08/18/2012   GOUT 12/26/2009   ALLERGIC RHINITIS, SEASONAL 10/08/2007   HYPERLIPIDEMIA, MIXED 05/02/2007    ONSET DATE: >1 year, chronic progressive REFERRING DIAG: imbalance  THERAPY DIAG:  Unsteadiness on feet  Rationale for Evaluation and Treatment: Rehabilitation  SUBJECTIVE:                                                                                                                                                                                             SUBJECTIVE STATEMENT:   Pt reports no changes since last session.    PERTINENT HISTORY:   78yoF who is referred by PCP for progressive changes in balance. Pt reports >2 years, Pt reports history of neuropathy. Pt also functionally limited in exertion by diastolic heart failure diagnosis, attended [facility] in 2023. Pt has ILD, is starting to have O2 delivery to home October 2025, but hesitant to begin using it.   Per pulmonology note 06/15/24: Chronic respiratory failure with exertional hypoxemia. Walk test today in office shows patient qualifies for pulsing oxygen  order for POC has been sent to DME. Continue on oxygen  at 3 to 4 L to maintain O2 saturations greater than 90%. Patient is encouraged to use oxygen  with activity to maintain O2 saturations greater than 90%    PAIN:  Are you having pain? No; not typically a problem.   PRECAUTIONS: Continuous 3L O2 via   WEIGHT BEARING RESTRICTIONS: No  FALLS: Has patient fallen in last 6 months? No (1 fall in  shower 3 years, ago, pt says not related to current issues)   LIVING ENVIRONMENT: Lives  with: husband Lives in: house  Stairs: 3 step to enter, 1 railing (having a a second installed soon)  Has following equipment at home: [facility] for heart failure reasons, longer diagnoses  PATIENT GOALS:  not sure  OBJECTIVE:  Note: Objective measures were completed at Evaluation unless otherwise noted.  SENSATION: Has known neuropathy, will see podiatry on 06/10/24 to become established;   PATIENT SURVEYS:  ABC 58% 5xSTS: hands free: 19 15.64sec : 10.41sec, no device, lateral deviation on return, sway 0.57m/s   30sec eyes closed narrow firm (30s); narrow foam (30s); narrow incline (30sec)                                                                                                                              TREATMENT DATE 07/06/24:    -SpO2 on arrival 84% on room air, ten 94% after seated recovery on room air  -standing heel raises x25, then standing BLE ankle DF (UE support); on room air 1 minute recovery is 87%  -standing heel raises x25, then standing BLE ankle DF (UE support)  -STS from chair x10, push off knees (to promote forward trunk weight shift); on 1L/min, recovery O2 at 87%  -STS from chair x10, push off knees (to promote forward trunk weight shift); cues for full upright posture, performed on 2L/min.  -airex pad stance: overhead glance x15, then 15x eyes closed x5secH)  -alternate SLS practice x20, then tandem stance walking, backward waling -lateral hedghog slide out 1x10 bilat, no UE support, minGuard asssit, 2L/minO2  PATIENT EDUCATION: Education details: Pt will need to consider how she will know that PT is making a functional difference in her life in the ability to fully participate in all meaningful activities.  Person educated:  Education method: Explanation Education comprehension: verbalized understanding  HOME EXERCISE PROGRAM: Access Code: [email or web address] URL: [email or web address] Date: 06/29/2024 Prepared by: Chiquita Silvan  Exercises -  Sit to Stand Without Arm Support  - 1 x daily - 5 x weekly - 2 sets - 10 reps - Standing Hip Extension with Counter Support  - 1 x daily - 7 x weekly - 2 sets - 10 reps - Sidestepping near counter   - 1 x daily - 7 x weekly - 2 sets - 10 reps - Seated March  - 1 x daily - 7 x weekly - 3 sets - 10 reps - Seated Long Arc Quad  - 1 x daily - 7 x weekly - 3 sets - 10 reps - Seated Hip Adduction Isometrics with Ball  - 1 x daily - 7 x weekly - 3 sets - 10 reps - 2-3 sec hold - Seated Hip Abduction with Resistance  - 1 x daily - 7 x weekly - 3 sets - 10 reps  GOALS: Goals reviewed with patient? No  SHORT TERM GOALS: Target date: 07/10/24  ABC  scale score improvement >12% to indicate improved confidence.  Baseline: Goal status: INITIAL  2.  Pt to demonstrate 5xSTS from standard height chair, hands free in <16sec Baseline: <16 sec from 19  Goal status: INITIAL  3.  Pt to report safe and consistent performance of weekly balance program at home.  Baseline:  Goal status: INITIAL  LONG TERM GOALS: Target date: 08/04/24  Pt to report regular performance in advanced home program to maintain current balance performance and gains long term.  Baseline:  Goal status: INITIAL  2.  ABC Scale improvement > 25% to indicate improved confidence.  Baseline:  Goal status: INITIAL  3.  5xSTS <12 sec hands free from standard chair height.  Baseline:  Goal status: INITIAL  4.  <9.5sec without deviation from line of progression.  Baseline:  Goal status: INITIAL  ASSESSMENT:  CLINICAL IMPRESSION: Pt arrives with hypoxia, reports insurance is limited what is available for O2 use. O2 used in session to facilitate recovery saturations within 90sec. Continued with strength and balance interventions. Sat remain 87-96% on 2L/min. Pt will continue to benefit from skilled physical therapy intervention to address impairments, improve QOL, and attain therapy goals.   OBJECTIVE IMPAIRMENTS: Decreased  knowledge of condition, decreased use of DME, decreased mobility, difficulty walking, decreased strength, decreased ROM. ACTIVITY LIMITATIONS: Lifting, standing, walking, squatting, transfers, locomotion level PARTICIPATION LIMITATIONS: Cleaning, laundry, interpersonal relationships, driving, yardwork, community activity.  PERSONAL FACTORS: Age, behavior pattern, education, past/current experiences, transportation, profession  are also affecting patient's functional outcome.  REHAB POTENTIAL: Good CLINICAL DECISION MAKING: Medium  EVALUATION COMPLEXITY: Moderate    PLAN:  PT FREQUENCY: 1-2x/week  PT DURATION: 8 weeks  PLANNED INTERVENTIONS: 97110-Therapeutic exercises, 97530- Therapeutic activity, 97112- Neuromuscular re-education, 97535- Self Care, 02859- Manual therapy, [phone]- Gait training, Patient/Family education, Balance training, Stair training, Vestibular training, Visual/preceptual remediation/compensation, Cognitive remediation, DME instructions, Cryotherapy, and Moist heat  PLAN FOR NEXT SESSION:  Provide O2 during session - 3L via [place] Walking with eyes closed Full MMT assessment of ankles and hips Monitor SPO2% as indicated    10:30 AM, 07/06/24 Peggye JAYSON Linear, PT, DPT Physical Therapist - [facility]  Outpatient Physical Therapy- Main Campus [phone]

## 2024-07-08 ENCOUNTER — Ambulatory Visit

## 2024-07-08 DIAGNOSIS — J984 Other disorders of lung: Secondary | ICD-10-CM | POA: Diagnosis not present

## 2024-07-08 DIAGNOSIS — I2510 Atherosclerotic heart disease of native coronary artery without angina pectoris: Secondary | ICD-10-CM | POA: Diagnosis not present

## 2024-07-08 DIAGNOSIS — R2681 Unsteadiness on feet: Secondary | ICD-10-CM | POA: Diagnosis not present

## 2024-07-08 DIAGNOSIS — I700 Atherosclerosis of aorta: Secondary | ICD-10-CM | POA: Diagnosis not present

## 2024-07-08 DIAGNOSIS — R0609 Other forms of dyspnea: Secondary | ICD-10-CM | POA: Diagnosis not present

## 2024-07-08 DIAGNOSIS — J849 Interstitial pulmonary disease, unspecified: Secondary | ICD-10-CM | POA: Diagnosis not present

## 2024-07-08 NOTE — Therapy (Signed)
 OUTPATIENT PHYSICAL THERAPY TREATMENT  Patient Name: Betty [name] MRN: [id number] DOB:July 19, 1946, 78 y.o., female Today's Date: 07/08/2024  PCP: Gretel App, NP REFERRING PROVIDER: Luke Shade, MD   END OF SESSION:   PT End of Session - 07/08/24 0854     Visit Number 7    Number of Visits 16    Date for Recertification  08/04/24    Authorization Type [employer]    Authorization Time Period 06/09/24-08/04/24    Progress Note Due on Visit 10    PT Start Time 0849    PT Stop Time 0929    PT Time Calculation (min) 40 min    Equipment Utilized During Treatment Gait belt    Activity Tolerance Patient tolerated treatment well;No increased pain    Behavior During Therapy WFL for tasks assessed/performed             Past Medical History:  Diagnosis Date   Allergic rhinitis    never tested, fall and spring   Arthritis    hands,knees, feet   Cataracts, bilateral    not surgical yet   CHOLECYSTECTOMY, HX OF 10/08/2007   Qualifier: Diagnosis of  By: Bartley MD, Lamar Mulch    Chronic diastolic CHF (congestive heart failure) (HCC)    Chronic respiratory failure (HCC)    Diabetes mellitus without complication (HCC)    Diverticulosis    problems with frequent gas, burping   Glucose intolerance (impaired glucose tolerance)    Gout    History of chicken pox    Hyperlipidemia    Hypertension    NICM (nonischemic cardiomyopathy) (HCC) 2002   EF 25%; improved to normal - echo 4/08: EF 50-55%, mild MR, mild LAE, mild TR;    cath 3/03: normal cors, EF 40%   Obesity    Oxygen  deficiency 2017   at night   PONV (postoperative nausea and vomiting)    after wisdom tooth extraction   Restrictive lung disease    Skin cancer    skin cancers only   Sleep apnea    cpap settings 4   Past Surgical History:  Procedure Laterality Date   BILATERAL VATS ABLATION Right    biopsy done 2015- [facility]   BREAST EXCISIONAL BIOPSY Left 80's   NEG   BREAST SURGERY  80's   small cyst-  non-cancerous   CARDIAC CATHETERIZATION     CATARACT EXTRACTION W/PHACO Right 06/21/2020   Procedure: CATARACT EXTRACTION PHACO AND INTRAOCULAR LENS PLACEMENT (IOC) RIGHT DIABETIC;  Surgeon: Jaye Fallow, MD;  Location: [facility];  Service: Ophthalmology;  Laterality: Right;  4.06 0:32.8   CATARACT EXTRACTION W/PHACO Left 07/12/2020   Procedure: CATARACT EXTRACTION PHACO AND INTRAOCULAR LENS PLACEMENT (IOC) LEFT DIABETIC 6.96 00:46.3;  Surgeon: Jaye Fallow, MD;  Location: [facility];  Service: Ophthalmology;  Laterality: Left;  Diabetic - oral meds   CHOLECYSTECTOMY     choleystectomy  02/2002   COLONOSCOPY WITH PROPOFOL  N/A 12/20/2014   Procedure: COLONOSCOPY WITH PROPOFOL ;  Surgeon: Gordy CHRISTELLA Starch, MD;  Location: [facility];  Service: Gastroenterology;  Laterality: N/A;   COLONOSCOPY WITH PROPOFOL  N/A 03/22/2020   Procedure: COLONOSCOPY WITH PROPOFOL ;  Surgeon: Starch Gordy CHRISTELLA, MD;  Location: [facility];  Service: Gastroenterology;  Laterality: N/A;   CYSTECTOMY  1996   l breast   DILATION AND CURETTAGE OF UTERUS  2011   Dr. Starla at [facility]   INDUCED ABORTION     nsvd     x 2   POLYPECTOMY  03/22/2020   Procedure: POLYPECTOMY;  Surgeon: Albertus Gordy HERO, MD;  Location: [facility];  Service: Gastroenterology;;   TUBAL LIGATION  1975   VAGINAL DELIVERY     x2   Patient Active Problem List   Diagnosis Date Noted   ILD (interstitial lung disease) (HCC) 06/08/2024   Controlled type 2 diabetes mellitus with diabetic nephropathy, without long-term current use of insulin (HCC) 06/04/2024   Callus 06/04/2024   Balance problem 06/04/2024   Primary hypertension 10/30/2023   Onychomycosis 11/19/2022   Venous insufficiency 05/17/2021   Benign neoplasm of sigmoid colon    Pulmonary HTN (HCC) 10/27/2019   Restrictive lung disease 08/06/2016   Left-sided low back pain with left-sided sciatica 05/01/2016   Encounter for general adult medical examination  with abnormal findings 01/27/2016   Benign neoplasm of ascending colon    Benign neoplasm of transverse colon    Chronic diastolic heart failure (HCC) 04/08/2014   Obesity (BMI 30-39.9) 04/09/2013   Diabetes mellitus type 2, controlled (HCC) 01/07/2013   Chronic hypoxemic respiratory failure (HCC) 11/11/2012   Obstructive sleep apnea 11/11/2012   DOE (dyspnea on exertion) 08/18/2012   GOUT 12/26/2009   ALLERGIC RHINITIS, SEASONAL 10/08/2007   HYPERLIPIDEMIA, MIXED 05/02/2007    ONSET DATE: >1 year, chronic progressive REFERRING DIAG: imbalance  THERAPY DIAG:  Unsteadiness on feet  Rationale for Evaluation and Treatment: Rehabilitation  SUBJECTIVE:                                                                                                                                                                                             SUBJECTIVE STATEMENT:   Pt reports no changes since last session.    PERTINENT HISTORY:   78yoF who is referred by PCP for progressive changes in balance. Pt reports >2 years, Pt reports history of neuropathy. Pt also functionally limited in exertion by diastolic heart failure diagnosis, attended [facility] in 2023. Pt has ILD, is starting to have O2 delivery to home October 2025, but hesitant to begin using it.   Per pulmonology note 06/15/24: Chronic respiratory failure with exertional hypoxemia. Walk test today in office shows patient qualifies for pulsing oxygen  order for POC has been sent to DME. Continue on oxygen  at 3 to 4 L to maintain O2 saturations greater than 90%. Patient is encouraged to use oxygen  with activity to maintain O2 saturations greater than 90%    PAIN:  Are you having pain? No; not typically a problem.   PRECAUTIONS: Continuous 3L O2 via [place], per pulmonology; still working on getting portable O2 unit.   WEIGHT BEARING RESTRICTIONS: No  FALLS: Has  patient fallen in last 6 months? No (1 fall in shower 3 years, ago, pt says not  related to current issues)   LIVING ENVIRONMENT: Lives with: husband Lives in: house  Stairs: 3 step to enter, 1 railing (having a a second installed soon)  Has following equipment at home: [facility] for heart failure reasons, longer diagnoses  PATIENT GOALS:  not sure  OBJECTIVE:  Note: Objective measures were completed at Evaluation unless otherwise noted.  SENSATION: Has known neuropathy, will see podiatry on 06/10/24 to become established;   PATIENT SURVEYS:  ABC 58% 5xSTS: hands free: 19 15.64sec : 10.41sec, no device, lateral deviation on return, sway 0.61m/s   30sec eyes closed narrow firm (30s); narrow foam (30s); narrow incline (30sec)   BLE Strength Assessment 07/08/24     Right  Left  Hip Flexion 4+/5 4+/5  Hip Horizontal ABDCT     Hip Horizontal ADD    Hip IR (seated)  4+/5 4+/5  Hip ER (seated) 5/5 4+/5  Knee Extension 5/5 5/5  Knee Flexion (seated) 5/5 5/5  Ankle Dorsiflexion 4-/5 4-/5  Ankle Plantarflexion (seated) N/T N/T  Ankle Plantarflexion (SLS)  N/T N/T                                                                                                                              TREATMENT DATE 07/08/24:    -SpO2 89% on room air -moved to [place] @ 3L per pulmonogist recommendation  -education on community mobility to come here: pt still has no portable O2: asked pt to take transportation with volunteer down to clinic. - AMB 488ft on 3L/min [place]: pt pulling O2 tank on roller (minimal SOB, SPO2 89%) -STS from chair x5, hands on knees -STS from chair + airex x12 -MMT BLE -STS from chair + airex x12 -standing bilat dorsum lift on half roll x12 -lateral side stepping in // bars 4 laps, RTB at knees, no hands  -alternate forward, backward in // bars, 4 laps, hands free, RTB at knees  Sit break -standing bilat dorsum lift on half roll x12 -lateral side stepping in // bars 4 laps, RTB at knees, no hands  -alternate forward, backward in // bars, 4 laps, hands free,  RTB at knees   PATIENT EDUCATION: Education details: Pt will need to consider how she will know that PT is making a functional difference in her life in the ability to fully participate in all meaningful activities.  Person educated:  Education method: Explanation Education comprehension: verbalized understanding  HOME EXERCISE PROGRAM: Access Code: [email or web address] URL: [email or web address] Date: 06/29/2024 Prepared by: Chiquita Silvan  Exercises - Sit to Stand Without Arm Support  - 1 x daily - 5 x weekly - 2 sets - 10 reps - Standing Hip Extension with Counter Support  - 1 x daily - 7 x weekly - 2 sets - 10 reps - Sidestepping near counter   - 1 x daily - 7 x weekly -  2 sets - 10 reps - Seated March  - 1 x daily - 7 x weekly - 3 sets - 10 reps - Seated Long Arc Quad  - 1 x daily - 7 x weekly - 3 sets - 10 reps - Seated Hip Adduction Isometrics with Ball  - 1 x daily - 7 x weekly - 3 sets - 10 reps - 2-3 sec hold - Seated Hip Abduction with Resistance  - 1 x daily - 7 x weekly - 3 sets - 10 reps  GOALS: Goals reviewed with patient? No  SHORT TERM GOALS: Target date: 07/10/24  ABC scale score improvement >12% to indicate improved confidence.  Baseline: Goal status: INITIAL  2.  Pt to demonstrate 5xSTS from standard height chair, hands free in <16sec Baseline: <16 sec from 19  Goal status: INITIAL  3.  Pt to report safe and consistent performance of weekly balance program at home.  Baseline: 07/08/24 current Goal status: ACHIEVED   LONG TERM GOALS: Target date: 08/04/24  Pt to report regular performance in advanced home program to maintain current balance performance and gains long term.  Baseline:  Goal status: INITIAL  2.  ABC Scale improvement > 25% to indicate improved confidence.  Baseline:  Goal status: INITIAL  3.  5xSTS <12 sec hands free from standard chair height.  Baseline:  Goal status: INITIAL  4.  <9.5sec without deviation from line of  progression.  Baseline:  Goal status: INITIAL  ASSESSMENT:  CLINICAL IMPRESSION: Maintained O2 during session. Asked pt to use transport down until she has pulsed O2 available for mobility. Completed MMT formally, noted biggest weakness in ankle DF bilat, added to program here. Pt will continue to benefit from skilled physical therapy intervention to address impairments, improve QOL, and attain therapy goals.   OBJECTIVE IMPAIRMENTS: Decreased knowledge of condition, decreased use of DME, decreased mobility, difficulty walking, decreased strength, decreased ROM. ACTIVITY LIMITATIONS: Lifting, standing, walking, squatting, transfers, locomotion level PARTICIPATION LIMITATIONS: Cleaning, laundry, interpersonal relationships, driving, yardwork, community activity.  PERSONAL FACTORS: Age, behavior pattern, education, past/current experiences, transportation, profession  are also affecting patient's functional outcome.  REHAB POTENTIAL: Good CLINICAL DECISION MAKING: Medium  EVALUATION COMPLEXITY: Moderate    PLAN:  PT FREQUENCY: 1-2x/week  PT DURATION: 8 weeks  PLANNED INTERVENTIONS: 97110-Therapeutic exercises, 97530- Therapeutic activity, 97112- Neuromuscular re-education, 97535- Self Care, 02859- Manual therapy, [phone]- Gait training, Patient/Family education, Balance training, Stair training, Vestibular training, Visual/preceptual remediation/compensation, Cognitive remediation, DME instructions, Cryotherapy, and Moist heat  PLAN FOR NEXT SESSION:  Provide O2 during session - 3L via [place] Walking with eyes closed Full MMT assessment of ankles and hips Monitor SPO2% as indicated    9:06 AM, 07/08/24 Peggye JAYSON Linear, PT, DPT Physical Therapist - [facility]  Outpatient Physical Therapy- Main Campus [phone]

## 2024-07-13 ENCOUNTER — Ambulatory Visit: Admitting: Nurse Practitioner

## 2024-07-13 ENCOUNTER — Ambulatory Visit: Admitting: Physical Therapy

## 2024-07-13 DIAGNOSIS — R2681 Unsteadiness on feet: Secondary | ICD-10-CM

## 2024-07-13 DIAGNOSIS — J849 Interstitial pulmonary disease, unspecified: Secondary | ICD-10-CM | POA: Diagnosis not present

## 2024-07-13 DIAGNOSIS — I700 Atherosclerosis of aorta: Secondary | ICD-10-CM | POA: Diagnosis not present

## 2024-07-13 DIAGNOSIS — J984 Other disorders of lung: Secondary | ICD-10-CM | POA: Diagnosis not present

## 2024-07-13 DIAGNOSIS — R0609 Other forms of dyspnea: Secondary | ICD-10-CM | POA: Diagnosis not present

## 2024-07-13 DIAGNOSIS — I2510 Atherosclerotic heart disease of native coronary artery without angina pectoris: Secondary | ICD-10-CM | POA: Diagnosis not present

## 2024-07-13 NOTE — Therapy (Unsigned)
 OUTPATIENT PHYSICAL THERAPY TREATMENT  Patient Name: Betty [name] MRN: [id number] DOB:December 18, 1945, 78 y.o., female Today's Date: 07/13/2024  PCP: Gretel App, NP REFERRING PROVIDER: Luke Shade, MD   END OF SESSION:   PT End of Session - 07/13/24 1536     Visit Number 8    Number of Visits 16    Date for Recertification  08/04/24    Authorization Type [employer]    Authorization Time Period 06/09/24-08/04/24    Progress Note Due on Visit 10    PT Start Time 1533    PT Stop Time 1613    PT Time Calculation (min) 40 min    Equipment Utilized During Treatment Gait belt    Activity Tolerance Patient tolerated treatment well;No increased pain    Behavior During Therapy WFL for tasks assessed/performed             Past Medical History:  Diagnosis Date   Allergic rhinitis    never tested, fall and spring   Arthritis    hands,knees, feet   Cataracts, bilateral    not surgical yet   CHOLECYSTECTOMY, HX OF 10/08/2007   Qualifier: Diagnosis of  By: Bartley MD, Lamar Mulch    Chronic diastolic CHF (congestive heart failure) (HCC)    Chronic respiratory failure (HCC)    Diabetes mellitus without complication (HCC)    Diverticulosis    problems with frequent gas, burping   Glucose intolerance (impaired glucose tolerance)    Gout    History of chicken pox    Hyperlipidemia    Hypertension    NICM (nonischemic cardiomyopathy) (HCC) 2002   EF 25%; improved to normal - echo 4/08: EF 50-55%, mild MR, mild LAE, mild TR;    cath 3/03: normal cors, EF 40%   Obesity    Oxygen  deficiency 2017   at night   PONV (postoperative nausea and vomiting)    after wisdom tooth extraction   Restrictive lung disease    Skin cancer    skin cancers only   Sleep apnea    cpap settings 4   Past Surgical History:  Procedure Laterality Date   BILATERAL VATS ABLATION Right    biopsy done 2015- [facility]   BREAST EXCISIONAL BIOPSY Left 80's   NEG   BREAST SURGERY  80's   small cyst-  non-cancerous   CARDIAC CATHETERIZATION     CATARACT EXTRACTION W/PHACO Right 06/21/2020   Procedure: CATARACT EXTRACTION PHACO AND INTRAOCULAR LENS PLACEMENT (IOC) RIGHT DIABETIC;  Surgeon: Jaye Fallow, MD;  Location: [facility];  Service: Ophthalmology;  Laterality: Right;  4.06 0:32.8   CATARACT EXTRACTION W/PHACO Left 07/12/2020   Procedure: CATARACT EXTRACTION PHACO AND INTRAOCULAR LENS PLACEMENT (IOC) LEFT DIABETIC 6.96 00:46.3;  Surgeon: Jaye Fallow, MD;  Location: [facility];  Service: Ophthalmology;  Laterality: Left;  Diabetic - oral meds   CHOLECYSTECTOMY     choleystectomy  02/2002   COLONOSCOPY WITH PROPOFOL  N/A 12/20/2014   Procedure: COLONOSCOPY WITH PROPOFOL ;  Surgeon: Gordy CHRISTELLA Starch, MD;  Location: [facility];  Service: Gastroenterology;  Laterality: N/A;   COLONOSCOPY WITH PROPOFOL  N/A 03/22/2020   Procedure: COLONOSCOPY WITH PROPOFOL ;  Surgeon: Starch Gordy CHRISTELLA, MD;  Location: [facility];  Service: Gastroenterology;  Laterality: N/A;   CYSTECTOMY  1996   l breast   DILATION AND CURETTAGE OF UTERUS  2011   Dr. Starla at [facility]   INDUCED ABORTION     nsvd     x 2   POLYPECTOMY  03/22/2020   Procedure: POLYPECTOMY;  Surgeon: Albertus Gordy HERO, MD;  Location: [facility];  Service: Gastroenterology;;   TUBAL LIGATION  1975   VAGINAL DELIVERY     x2   Patient Active Problem List   Diagnosis Date Noted   ILD (interstitial lung disease) (HCC) 06/08/2024   Controlled type 2 diabetes mellitus with diabetic nephropathy, without long-term current use of insulin (HCC) 06/04/2024   Callus 06/04/2024   Balance problem 06/04/2024   Primary hypertension 10/30/2023   Onychomycosis 11/19/2022   Venous insufficiency 05/17/2021   Benign neoplasm of sigmoid colon    Pulmonary HTN (HCC) 10/27/2019   Restrictive lung disease 08/06/2016   Left-sided low back pain with left-sided sciatica 05/01/2016   Encounter for general adult medical examination  with abnormal findings 01/27/2016   Benign neoplasm of ascending colon    Benign neoplasm of transverse colon    Chronic diastolic heart failure (HCC) 04/08/2014   Obesity (BMI 30-39.9) 04/09/2013   Diabetes mellitus type 2, controlled (HCC) 01/07/2013   Chronic hypoxemic respiratory failure (HCC) 11/11/2012   Obstructive sleep apnea 11/11/2012   DOE (dyspnea on exertion) 08/18/2012   GOUT 12/26/2009   ALLERGIC RHINITIS, SEASONAL 10/08/2007   HYPERLIPIDEMIA, MIXED 05/02/2007    ONSET DATE: >1 year, chronic progressive REFERRING DIAG: imbalance  THERAPY DIAG:  No diagnosis found.  Rationale for Evaluation and Treatment: Rehabilitation  SUBJECTIVE:                                                                                                                                                                                             SUBJECTIVE STATEMENT:   Pt reports no changes since last session.    PERTINENT HISTORY:   78yoF who is referred by PCP for progressive changes in balance. Pt reports >2 years, Pt reports history of neuropathy. Pt also functionally limited in exertion by diastolic heart failure diagnosis, attended [facility] in 2023. Pt has ILD, is starting to have O2 delivery to home October 2025, but hesitant to begin using it.   Per pulmonology note 06/15/24: Chronic respiratory failure with exertional hypoxemia. Walk test today in office shows patient qualifies for pulsing oxygen  order for POC has been sent to DME. Continue on oxygen  at 3 to 4 L to maintain O2 saturations greater than 90%. Patient is encouraged to use oxygen  with activity to maintain O2 saturations greater than 90%    PAIN:  Are you having pain? No; not typically a problem.   PRECAUTIONS: Continuous 3L O2 via [place], per pulmonology; still working on getting portable O2 unit.   WEIGHT BEARING RESTRICTIONS: No  FALLS: Has  patient fallen in last 6 months? No (1 fall in shower 3 years, ago, pt says not  related to current issues)   LIVING ENVIRONMENT: Lives with: husband Lives in: house  Stairs: 3 step to enter, 1 railing (having a a second installed soon)  Has following equipment at home: [facility] for heart failure reasons, longer diagnoses  PATIENT GOALS:  not sure  OBJECTIVE:  Note: Objective measures were completed at Evaluation unless otherwise noted.  SENSATION: Has known neuropathy, will see podiatry on 06/10/24 to become established;   PATIENT SURVEYS:  ABC 58% 5xSTS: hands free: 19 15.64sec : 10.41sec, no device, lateral deviation on return, sway 0.41m/s   30sec eyes closed narrow firm (30s); narrow foam (30s); narrow incline (30sec)   BLE Strength Assessment 07/08/24     Right  Left  Hip Flexion 4+/5 4+/5  Hip Horizontal ABDCT     Hip Horizontal ADD    Hip IR (seated)  4+/5 4+/5  Hip ER (seated) 5/5 4+/5  Knee Extension 5/5 5/5  Knee Flexion (seated) 5/5 5/5  Ankle Dorsiflexion 4-/5 4-/5  Ankle Plantarflexion (seated) N/T N/T  Ankle Plantarflexion (SLS)  N/T N/T                                                                                                                              TREATMENT DATE 07/13/24:   TA- To improve functional movements patterns for everyday tasks  / TE- To improve strength, endurance, mobility, and function of specific targeted muscle groups or improve joint range of motion or improve muscle flexibility   -Pt transported to clinic in transport chair -SpO2 89% on room air -donned to [place] @ 3L per pulmonogist recommendation, oxygen  tank from [employer]   Nustep double hill setting level 4-9, B UE and LE reciprocal movement training SPO2 94 at 4 min and 92 at 8 min STS from chair x8 SPO2 96% following -STS from chair x8, hands on knees for 2 reps then x 3 ea UE with 1 hand on chair arm  Standing march 2 x 10 with 4# AW  Standing HS curls 2 x 8 reps  Standing heel raises 3x 15 reps on incline with 4#AW  Sidestep to airex and down x 10  ea  Forward step to airex and retro step to floor x 5 ea  To transport chair then O2 checked to end session at 89, within 1 min up to 93%, removed canula and oxygen  to end session and encouraged regular oxygen  use   PATIENT EDUCATION: Education details: Pt will need to consider how she will know that PT is making a functional difference in her life in the ability to fully participate in all meaningful activities.  Person educated:  Education method: Explanation Education comprehension: verbalized understanding  HOME EXERCISE PROGRAM: Access Code: [email or web address] URL: [email or web address] Date: 06/29/2024 Prepared by: Chiquita Silvan  Exercises - Sit to Stand Without Arm Support  - 1 x daily -  5 x weekly - 2 sets - 10 reps - Standing Hip Extension with Counter Support  - 1 x daily - 7 x weekly - 2 sets - 10 reps - Sidestepping near counter   - 1 x daily - 7 x weekly - 2 sets - 10 reps - Seated March  - 1 x daily - 7 x weekly - 3 sets - 10 reps - Seated Long Arc Quad  - 1 x daily - 7 x weekly - 3 sets - 10 reps - Seated Hip Adduction Isometrics with Ball  - 1 x daily - 7 x weekly - 3 sets - 10 reps - 2-3 sec hold - Seated Hip Abduction with Resistance  - 1 x daily - 7 x weekly - 3 sets - 10 reps  GOALS: Goals reviewed with patient? No  SHORT TERM GOALS: Target date: 07/10/24  ABC scale score improvement >12% to indicate improved confidence.  Baseline: Goal status: INITIAL  2.  Pt to demonstrate 5xSTS from standard height chair, hands free in <16sec Baseline: <16 sec from 19  Goal status: INITIAL  3.  Pt to report safe and consistent performance of weekly balance program at home.  Baseline: 07/08/24 current Goal status: ACHIEVED   LONG TERM GOALS: Target date: 08/04/24  Pt to report regular performance in advanced home program to maintain current balance performance and gains long term.  Baseline:  Goal status: INITIAL  2.  ABC Scale improvement > 25% to indicate  improved confidence.  Baseline:  Goal status: INITIAL  3.  5xSTS <12 sec hands free from standard chair height.  Baseline:  Goal status: INITIAL  4.  <9.5sec without deviation from line of progression.  Baseline:  Goal status: INITIAL  ASSESSMENT:  CLINICAL IMPRESSION: Maintained O2 during session. Pt still doing well with supplemental oxygen . Encouraged to use supplemental oxygen  as frequently as able and as referred by physician due to her resting saturation rates on room air. Pt will continue to benefit from skilled physical therapy intervention to address impairments, improve QOL, and attain therapy goals.   OBJECTIVE IMPAIRMENTS: Decreased knowledge of condition, decreased use of DME, decreased mobility, difficulty walking, decreased strength, decreased ROM. ACTIVITY LIMITATIONS: Lifting, standing, walking, squatting, transfers, locomotion level PARTICIPATION LIMITATIONS: Cleaning, laundry, interpersonal relationships, driving, yardwork, community activity.  PERSONAL FACTORS: Age, behavior pattern, education, past/current experiences, transportation, profession  are also affecting patient's functional outcome.  REHAB POTENTIAL: Good CLINICAL DECISION MAKING: Medium  EVALUATION COMPLEXITY: Moderate    PLAN:  PT FREQUENCY: 1-2x/week  PT DURATION: 8 weeks  PLANNED INTERVENTIONS: 97110-Therapeutic exercises, 97530- Therapeutic activity, 97112- Neuromuscular re-education, 97535- Self Care, [id number]- Manual therapy, [phone]- Gait training, Patient/Family education, Balance training, Stair training, Vestibular training, Visual/preceptual remediation/compensation, Cognitive remediation, DME instructions, Cryotherapy, and Moist heat  PLAN FOR NEXT SESSION:  Provide O2 during session - 3L via [place] Walking with eyes closed Full MMT assessment of ankles and hips Monitor SPO2% as indicated    3:37 PM, 07/13/24 Note: Portions of this document were prepared using Dragon voice  recognition software and although reviewed may contain unintentional dictation errors in syntax, grammar, or spelling.  Lonni KATHEE Gainer PT ,DPT Physical Therapist-   [facility]

## 2024-07-15 ENCOUNTER — Ambulatory Visit: Admitting: Physical Therapy

## 2024-07-15 DIAGNOSIS — J984 Other disorders of lung: Secondary | ICD-10-CM | POA: Diagnosis not present

## 2024-07-15 DIAGNOSIS — R2681 Unsteadiness on feet: Secondary | ICD-10-CM

## 2024-07-15 DIAGNOSIS — I2510 Atherosclerotic heart disease of native coronary artery without angina pectoris: Secondary | ICD-10-CM | POA: Diagnosis not present

## 2024-07-15 DIAGNOSIS — J849 Interstitial pulmonary disease, unspecified: Secondary | ICD-10-CM | POA: Diagnosis not present

## 2024-07-15 DIAGNOSIS — I700 Atherosclerosis of aorta: Secondary | ICD-10-CM | POA: Diagnosis not present

## 2024-07-15 DIAGNOSIS — R0609 Other forms of dyspnea: Secondary | ICD-10-CM | POA: Diagnosis not present

## 2024-07-15 NOTE — Therapy (Signed)
 OUTPATIENT PHYSICAL THERAPY TREATMENT  Patient Name: Betty [name] MRN: [id number] DOB:06/08/1946, 78 y.o., female Today's Date: 07/15/2024  PCP: Gretel App, NP REFERRING PROVIDER: Luke Shade, MD   END OF SESSION:   PT End of Session - 07/15/24 1109     Visit Number 9    Number of Visits 16    Date for Recertification  08/04/24    Authorization Type [employer]    Authorization Time Period 06/09/24-08/04/24    Progress Note Due on Visit 10    PT Start Time 1015    PT Stop Time 1055    PT Time Calculation (min) 40 min    Equipment Utilized During Treatment Gait belt    Activity Tolerance Patient tolerated treatment well;No increased pain    Behavior During Therapy WFL for tasks assessed/performed           Past Medical History:  Diagnosis Date   Allergic rhinitis    never tested, fall and spring   Arthritis    hands,knees, feet   Cataracts, bilateral    not surgical yet   CHOLECYSTECTOMY, HX OF 10/08/2007   Qualifier: Diagnosis of  By: Bartley MD, Lamar Mulch    Chronic diastolic CHF (congestive heart failure) (HCC)    Chronic respiratory failure (HCC)    Diabetes mellitus without complication (HCC)    Diverticulosis    problems with frequent gas, burping   Glucose intolerance (impaired glucose tolerance)    Gout    History of chicken pox    Hyperlipidemia    Hypertension    NICM (nonischemic cardiomyopathy) (HCC) 2002   EF 25%; improved to normal - echo 4/08: EF 50-55%, mild MR, mild LAE, mild TR;    cath 3/03: normal cors, EF 40%   Obesity    Oxygen  deficiency 2017   at night   PONV (postoperative nausea and vomiting)    after wisdom tooth extraction   Restrictive lung disease    Skin cancer    skin cancers only   Sleep apnea    cpap settings 4   Past Surgical History:  Procedure Laterality Date   BILATERAL VATS ABLATION Right    biopsy done 2015- [facility]   BREAST EXCISIONAL BIOPSY Left 80's   NEG   BREAST SURGERY  80's   small cyst-  non-cancerous   CARDIAC CATHETERIZATION     CATARACT EXTRACTION W/PHACO Right 06/21/2020   Procedure: CATARACT EXTRACTION PHACO AND INTRAOCULAR LENS PLACEMENT (IOC) RIGHT DIABETIC;  Surgeon: Jaye Fallow, MD;  Location: [facility];  Service: Ophthalmology;  Laterality: Right;  4.06 0:32.8   CATARACT EXTRACTION W/PHACO Left 07/12/2020   Procedure: CATARACT EXTRACTION PHACO AND INTRAOCULAR LENS PLACEMENT (IOC) LEFT DIABETIC 6.96 00:46.3;  Surgeon: Jaye Fallow, MD;  Location: [facility];  Service: Ophthalmology;  Laterality: Left;  Diabetic - oral meds   CHOLECYSTECTOMY     choleystectomy  02/2002   COLONOSCOPY WITH PROPOFOL  N/A 12/20/2014   Procedure: COLONOSCOPY WITH PROPOFOL ;  Surgeon: Gordy CHRISTELLA Starch, MD;  Location: [facility];  Service: Gastroenterology;  Laterality: N/A;   COLONOSCOPY WITH PROPOFOL  N/A 03/22/2020   Procedure: COLONOSCOPY WITH PROPOFOL ;  Surgeon: Starch Gordy CHRISTELLA, MD;  Location: [facility];  Service: Gastroenterology;  Laterality: N/A;   CYSTECTOMY  1996   l breast   DILATION AND CURETTAGE OF UTERUS  2011   Dr. Starla at [facility]   INDUCED ABORTION     nsvd     x 2   POLYPECTOMY  03/22/2020  Procedure: POLYPECTOMY;  Surgeon: Albertus Gordy HERO, MD;  Location: [facility];  Service: Gastroenterology;;   TUBAL LIGATION  1975   VAGINAL DELIVERY     x2   Patient Active Problem List   Diagnosis Date Noted   ILD (interstitial lung disease) (HCC) 06/08/2024   Controlled type 2 diabetes mellitus with diabetic nephropathy, without long-term current use of insulin (HCC) 06/04/2024   Callus 06/04/2024   Balance problem 06/04/2024   Primary hypertension 10/30/2023   Onychomycosis 11/19/2022   Venous insufficiency 05/17/2021   Benign neoplasm of sigmoid colon    Pulmonary HTN (HCC) 10/27/2019   Restrictive lung disease 08/06/2016   Left-sided low back pain with left-sided sciatica 05/01/2016   Encounter for general adult medical examination  with abnormal findings 01/27/2016   Benign neoplasm of ascending colon    Benign neoplasm of transverse colon    Chronic diastolic heart failure (HCC) 04/08/2014   Obesity (BMI 30-39.9) 04/09/2013   Diabetes mellitus type 2, controlled (HCC) 01/07/2013   Chronic hypoxemic respiratory failure (HCC) 11/11/2012   Obstructive sleep apnea 11/11/2012   DOE (dyspnea on exertion) 08/18/2012   GOUT 12/26/2009   ALLERGIC RHINITIS, SEASONAL 10/08/2007   HYPERLIPIDEMIA, MIXED 05/02/2007    ONSET DATE: >1 year, chronic progressive REFERRING DIAG: imbalance  THERAPY DIAG:  Unsteadiness on feet  Rationale for Evaluation and Treatment: Rehabilitation  SUBJECTIVE:                                                                                                                                                                                             SUBJECTIVE STATEMENT:   Pt reports no changes since last session. Pt explained that insurance will not cover both oxygen  tank and portable oxygen  concentrator. She plans to look into the cost of purchasing the device.    PERTINENT HISTORY:   78yoF who is referred by PCP for progressive changes in balance. Pt reports >2 years, Pt reports history of neuropathy. Pt also functionally limited in exertion by diastolic heart failure diagnosis, attended [facility] in 2023. Pt has ILD, is starting to have O2 delivery to home October 2025, but hesitant to begin using it.   Per pulmonology note 06/15/24: Chronic respiratory failure with exertional hypoxemia. Walk test today in office shows patient qualifies for pulsing oxygen  order for POC has been sent to DME. Continue on oxygen  at 3 to 4 L to maintain O2 saturations greater than 90%. Patient is encouraged to use oxygen  with activity to maintain O2 saturations greater than 90%    PAIN:  Are you having pain? No; not typically a problem.   PRECAUTIONS: Continuous  3L O2 via [place], per pulmonology; still working on  getting portable O2 unit.   WEIGHT BEARING RESTRICTIONS: No  FALLS: Has patient fallen in last 6 months? No (1 fall in shower 3 years, ago, pt says not related to current issues)   LIVING ENVIRONMENT: Lives with: husband Lives in: house  Stairs: 3 step to enter, 1 railing (having a a second installed soon)  Has following equipment at home: [facility] for heart failure reasons, longer diagnoses  PATIENT GOALS:  not sure  OBJECTIVE:  Note: Objective measures were completed at Evaluation unless otherwise noted.  SENSATION: Has known neuropathy, will see podiatry on 06/10/24 to become established;   PATIENT SURVEYS:  ABC 58% 5xSTS: hands free: 19 15.64sec : 10.41sec, no device, lateral deviation on return, sway 0.36m/s   30sec eyes closed narrow firm (30s); narrow foam (30s); narrow incline (30sec)   BLE Strength Assessment 07/08/24     Right  Left  Hip Flexion 4+/5 4+/5  Hip Horizontal ABDCT     Hip Horizontal ADD    Hip IR (seated)  4+/5 4+/5  Hip ER (seated) 5/5 4+/5  Knee Extension 5/5 5/5  Knee Flexion (seated) 5/5 5/5  Ankle Dorsiflexion 4-/5 4-/5  Ankle Plantarflexion (seated) N/T N/T  Ankle Plantarflexion (SLS)  N/T N/T                                                                                                                              TREATMENT DATE 07/15/24:   TA- To improve functional movements patterns for everyday tasks  / TE- To improve strength, endurance, mobility, and function of specific targeted muscle groups or improve joint range of motion or improve muscle flexibility  Pt transported to clinic in transport chair -SpO2 86% on room air -donned to [place] @ 3L per pulmonogist recommendation, oxygen  tank from [employer]  , SpO2 @  94% on 3 liters  Nustep rolling hill setting level 4-9, B UE and LE reciprocal movement training -SPO2 94% at 4 min and 96% at 8 min  STS from chair hand on knees  2 x 10 -SPO2 96% following  Standing march 2 x 10 with 4# AW   Standing HS curls 2 x 10 reps 4# AW  Sidestep over hurdle 2 x 10    Forward step to airex and retro step to floor x 2x10 ea   -VC to place entire foot on mat to prevent posterior LOB  End of session SpO2 94% - transferred to transport chair  PATIENT EDUCATION: Education details: Pt will need to consider how she will know that PT is making a functional difference in her life in the ability to fully participate in all meaningful activities.  Person educated:  Education method: Explanation Education comprehension: verbalized understanding  HOME EXERCISE PROGRAM: Access Code: [email or web address] URL: [email or web address] Date: 06/29/2024 Prepared by: Chiquita Silvan  Exercises - Sit to Stand Without Arm Support  - 1 x daily - 5  x weekly - 2 sets - 10 reps - Standing Hip Extension with Counter Support  - 1 x daily - 7 x weekly - 2 sets - 10 reps - Sidestepping near counter   - 1 x daily - 7 x weekly - 2 sets - 10 reps - Seated March  - 1 x daily - 7 x weekly - 3 sets - 10 reps - Seated Long Arc Quad  - 1 x daily - 7 x weekly - 3 sets - 10 reps - Seated Hip Adduction Isometrics with Ball  - 1 x daily - 7 x weekly - 3 sets - 10 reps - 2-3 sec hold - Seated Hip Abduction with Resistance  - 1 x daily - 7 x weekly - 3 sets - 10 reps  GOALS: Goals reviewed with patient? No  SHORT TERM GOALS: Target date: 07/10/24  ABC scale score improvement >12% to indicate improved confidence.  Baseline: Goal status: INITIAL  2.  Pt to demonstrate 5xSTS from standard height chair, hands free in <16sec Baseline: <16 sec from 19  Goal status: INITIAL  3.  Pt to report safe and consistent performance of weekly balance program at home.  Baseline: 07/08/24 current Goal status: ACHIEVED   LONG TERM GOALS: Target date: 08/04/24  Pt to report regular performance in advanced home program to maintain current balance performance and gains long term.  Baseline:  Goal status: INITIAL  2.  ABC Scale  improvement > 25% to indicate improved confidence.  Baseline:  Goal status: INITIAL  3.  5xSTS <12 sec hands free from standard chair height.  Baseline:  Goal status: INITIAL  4.  <9.5sec without deviation from line of progression.  Baseline:  Goal status: INITIAL  ASSESSMENT:  CLINICAL IMPRESSION: Pt arrived with good motivation for PT activities. Pt continues to demonstrate increased endurance with supplemental oxygen , and encouraged to utilize this when home. Pt able to maintain oxygen  above 94% throughout session. Today's session focused on therapeutic exercise and activities to help improve overall strength and functional activities. Pt was able to complete sit to stand with more ease this session. She still utilizes hands on knees but completed more repetitions with minimal rest between sets. Pt challenged with balance stepping onto airex pad. VC to place entire foot on mat was helpful in preventing posterior LOB. Pt will continue to benefit from skilled physical therapy intervention to address impairments, improve QOL, and attain therapy goals.   OBJECTIVE IMPAIRMENTS: Decreased knowledge of condition, decreased use of DME, decreased mobility, difficulty walking, decreased strength, decreased ROM. ACTIVITY LIMITATIONS: Lifting, standing, walking, squatting, transfers, locomotion level PARTICIPATION LIMITATIONS: Cleaning, laundry, interpersonal relationships, driving, yardwork, community activity.  PERSONAL FACTORS: Age, behavior pattern, education, past/current experiences, transportation, profession  are also affecting patient's functional outcome.  REHAB POTENTIAL: Good CLINICAL DECISION MAKING: Medium  EVALUATION COMPLEXITY: Moderate    PLAN:  PT FREQUENCY: 1-2x/week  PT DURATION: 8 weeks  PLANNED INTERVENTIONS: 97110-Therapeutic exercises, 97530- Therapeutic activity, 97112- Neuromuscular re-education, 97535- Self Care, [id number]- Manual therapy, [phone]- Gait training,  Patient/Family education, Balance training, Stair training, Vestibular training, Visual/preceptual remediation/compensation, Cognitive remediation, DME instructions, Cryotherapy, and Moist heat  PLAN FOR NEXT SESSION:  Provide O2 during session - 3L via [place] Dynamic gait: horizontal head turns, retro gait, directional change LE strengthening as indicated Monitor SPO2% as indicated    Leonor Rode, SPT

## 2024-07-20 ENCOUNTER — Ambulatory Visit: Admitting: Physical Therapy

## 2024-07-20 ENCOUNTER — Ambulatory Visit
Admission: RE | Admit: 2024-07-20 | Discharge: 2024-07-20 | Disposition: A | Discharge status: Home / Self Care | Source: Ambulatory Visit | Attending: Nurse Practitioner | Admitting: Nurse Practitioner

## 2024-07-20 DIAGNOSIS — R0609 Other forms of dyspnea: Secondary | ICD-10-CM | POA: Diagnosis not present

## 2024-07-20 DIAGNOSIS — I2510 Atherosclerotic heart disease of native coronary artery without angina pectoris: Secondary | ICD-10-CM | POA: Diagnosis not present

## 2024-07-20 DIAGNOSIS — J849 Interstitial pulmonary disease, unspecified: Secondary | ICD-10-CM | POA: Diagnosis not present

## 2024-07-20 DIAGNOSIS — R2681 Unsteadiness on feet: Secondary | ICD-10-CM | POA: Diagnosis not present

## 2024-07-20 DIAGNOSIS — I700 Atherosclerosis of aorta: Secondary | ICD-10-CM | POA: Diagnosis not present

## 2024-07-20 DIAGNOSIS — I288 Other diseases of pulmonary vessels: Secondary | ICD-10-CM | POA: Insufficient documentation

## 2024-07-20 DIAGNOSIS — J9610 Chronic respiratory failure, unspecified whether with hypoxia or hypercapnia: Secondary | ICD-10-CM | POA: Diagnosis not present

## 2024-07-20 DIAGNOSIS — J984 Other disorders of lung: Secondary | ICD-10-CM | POA: Diagnosis not present

## 2024-07-20 DIAGNOSIS — J9611 Chronic respiratory failure with hypoxia: Secondary | ICD-10-CM | POA: Insufficient documentation

## 2024-07-20 DIAGNOSIS — R0600 Dyspnea, unspecified: Secondary | ICD-10-CM | POA: Diagnosis present

## 2024-07-20 DIAGNOSIS — I272 Pulmonary hypertension, unspecified: Secondary | ICD-10-CM | POA: Insufficient documentation

## 2024-07-20 LAB — ECHOCARDIOGRAM COMPLETE
AR max vel: 1.95 cm2
AV Area VTI: 2.59 cm2
AV Area mean vel: 2.03 cm2
AV Mean grad: 4 mmHg
AV Peak grad: 7.2 mmHg
Ao pk vel: 1.34 m/s
Area-P 1/2: 4.6 cm2
Calc EF: 50.2 %
MV VTI: 2.91 cm2
S' Lateral: 3.7 cm
Single Plane A2C EF: 36.9 %
Single Plane A4C EF: 58.1 %

## 2024-07-20 NOTE — Therapy (Signed)
 OUTPATIENT PHYSICAL THERAPY TREATMENT/ PROGRESS NOTE  Patient Name: NIMRAT WOOLWORTH MRN: [id number] DOB:[birth year], 78 y.o., female Today's Date: 07/20/2024  PCP: Gretel App, NP REFERRING PROVIDER: Luke Shade, MD   Physical Therapy Progress Note   Dates of reporting period  06/09/2024   to   07/20/2024  END OF SESSION:   PT End of Session - 07/20/24 1008     Visit Number 10    Number of Visits 16    Date for Recertification  08/04/24    Authorization Type [employer]    Authorization Time Period 06/09/24-08/04/24    Progress Note Due on Visit 10    PT Start Time 1015    PT Stop Time 1055    PT Time Calculation (min) 40 min    Equipment Utilized During Treatment Gait belt    Activity Tolerance Patient tolerated treatment well;No increased pain    Behavior During Therapy WFL for tasks assessed/performed          Past Medical History:  Diagnosis Date   Allergic rhinitis    never tested, fall and spring   Arthritis    hands,knees, feet   Cataracts, bilateral    not surgical yet   CHOLECYSTECTOMY, HX OF 10/08/2007   Qualifier: Diagnosis of  By: Bartley MD, Lamar Mulch    Chronic diastolic CHF (congestive heart failure) (HCC)    Chronic respiratory failure (HCC)    Diabetes mellitus without complication (HCC)    Diverticulosis    problems with frequent gas, burping   Glucose intolerance (impaired glucose tolerance)    Gout    History of chicken pox    Hyperlipidemia    Hypertension    NICM (nonischemic cardiomyopathy) (HCC) 2002   EF 25%; improved to normal - echo 4/08: EF 50-55%, mild MR, mild LAE, mild TR;    cath 3/03: normal cors, EF 40%   Obesity    Oxygen  deficiency 2017   at night   PONV (postoperative nausea and vomiting)    after wisdom tooth extraction   Restrictive lung disease    Skin cancer    skin cancers only   Sleep apnea    cpap settings 4   Past Surgical History:  Procedure Laterality Date   BILATERAL VATS ABLATION Right     biopsy done 2015- [facility]   BREAST EXCISIONAL BIOPSY Left 80's   NEG   BREAST SURGERY  80's   small cyst- non-cancerous   CARDIAC CATHETERIZATION     CATARACT EXTRACTION W/PHACO Right 06/21/2020   Procedure: CATARACT EXTRACTION PHACO AND INTRAOCULAR LENS PLACEMENT (IOC) RIGHT DIABETIC;  Surgeon: Jaye Fallow, MD;  Location: [facility];  Service: Ophthalmology;  Laterality: Right;  4.06 0:32.8   CATARACT EXTRACTION W/PHACO Left 07/12/2020   Procedure: CATARACT EXTRACTION PHACO AND INTRAOCULAR LENS PLACEMENT (IOC) LEFT DIABETIC 6.96 00:46.3;  Surgeon: Jaye Fallow, MD;  Location: [facility];  Service: Ophthalmology;  Laterality: Left;  Diabetic - oral meds   CHOLECYSTECTOMY     choleystectomy  02/2002   COLONOSCOPY WITH PROPOFOL  N/A 12/20/2014   Procedure: COLONOSCOPY WITH PROPOFOL ;  Surgeon: Gordy CHRISTELLA Starch, MD;  Location: [facility];  Service: Gastroenterology;  Laterality: N/A;   COLONOSCOPY WITH PROPOFOL  N/A 03/22/2020   Procedure: COLONOSCOPY WITH PROPOFOL ;  Surgeon: Starch Gordy CHRISTELLA, MD;  Location: [facility];  Service: Gastroenterology;  Laterality: N/A;   CYSTECTOMY  1996   l breast   DILATION AND CURETTAGE OF UTERUS  2011   Dr. Starla at [facility]  INDUCED ABORTION     nsvd     x 2   POLYPECTOMY  03/22/2020   Procedure: POLYPECTOMY;  Surgeon: Albertus Gordy HERO, MD;  Location: [facility];  Service: Gastroenterology;;   TUBAL LIGATION  1975   VAGINAL DELIVERY     x2   Patient Active Problem List   Diagnosis Date Noted   ILD (interstitial lung disease) (HCC) 06/08/2024   Controlled type 2 diabetes mellitus with diabetic nephropathy, without long-term current use of insulin (HCC) 06/04/2024   Callus 06/04/2024   Balance problem 06/04/2024   Primary hypertension 10/30/2023   Onychomycosis 11/19/2022   Venous insufficiency 05/17/2021   Benign neoplasm of sigmoid colon    Pulmonary HTN (HCC) 10/27/2019   Restrictive lung disease 08/06/2016    Left-sided low back pain with left-sided sciatica 05/01/2016   Encounter for general adult medical examination with abnormal findings 01/27/2016   Benign neoplasm of ascending colon    Benign neoplasm of transverse colon    Chronic diastolic heart failure (HCC) 04/08/2014   Obesity (BMI 30-39.9) 04/09/2013   Diabetes mellitus type 2, controlled (HCC) 01/07/2013   Chronic hypoxemic respiratory failure (HCC) 11/11/2012   Obstructive sleep apnea 11/11/2012   DOE (dyspnea on exertion) 08/18/2012   GOUT 12/26/2009   ALLERGIC RHINITIS, SEASONAL 10/08/2007   HYPERLIPIDEMIA, MIXED 05/02/2007    ONSET DATE: >1 year, chronic progressive REFERRING DIAG: imbalance  THERAPY DIAG:  Unsteadiness on feet  Rationale for Evaluation and Treatment: Rehabilitation  SUBJECTIVE:                                                                                                                                                                                             SUBJECTIVE STATEMENT:   Pt is doing well, states she was up earlier for an MD appt and echocardiogram.    PERTINENT HISTORY:   78yoF who is referred by PCP for progressive changes in balance. Pt reports >2 years, Pt reports history of neuropathy. Pt also functionally limited in exertion by diastolic heart failure diagnosis, attended [facility] in 2023. Pt has ILD, is starting to have O2 delivery to home October 2025, but hesitant to begin using it.   Per pulmonology note 06/15/24: Chronic respiratory failure with exertional hypoxemia. Walk test today in office shows patient qualifies for pulsing oxygen  order for POC has been sent to DME. Continue on oxygen  at 3 to 4 L to maintain O2 saturations greater than 90%. Patient is encouraged to use oxygen  with activity to maintain O2 saturations greater than 90%    PAIN:  Are you having pain? No; not typically a problem.  PRECAUTIONS: Continuous 3L O2 via [place], per pulmonology; still working on  getting portable O2 unit.   WEIGHT BEARING RESTRICTIONS: No  FALLS: Has patient fallen in last 6 months? No (1 fall in shower 3 years, ago, pt says not related to current issues)   LIVING ENVIRONMENT: Lives with: husband Lives in: house  Stairs: 3 step to enter, 1 railing (having a a second installed soon)  Has following equipment at home: [facility] for heart failure reasons, longer diagnoses  PATIENT GOALS:  not sure  OBJECTIVE:  Note: Objective measures were completed at Evaluation unless otherwise noted.  SENSATION: Has known neuropathy, will see podiatry on 06/10/24 to become established;   PATIENT SURVEYS:  ABC 58% 5xSTS: hands free: 19 15.64sec : 10.41sec, no device, lateral deviation on return, sway 0.86m/s   30sec eyes closed narrow firm (30s); narrow foam (30s); narrow incline (30sec)   BLE Strength Assessment 07/08/24     Right  Left  Hip Flexion 4+/5 4+/5  Hip Horizontal ABDCT     Hip Horizontal ADD    Hip IR (seated)  4+/5 4+/5  Hip ER (seated) 5/5 4+/5  Knee Extension 5/5 5/5  Knee Flexion (seated) 5/5 5/5  Ankle Dorsiflexion 4-/5 4-/5  Ankle Plantarflexion (seated) N/T N/T  Ankle Plantarflexion (SLS)  N/T N/T                                                                                                                              TREATMENT DATE 07/20/24:    Pt transported to clinic in transport chair: SpO2: 85% on arrival -On 3 liters oxygen  SpO2 94%  Physical Performance Test or Measurement: a  physical performance test(s) or measurement (eg,  musculoskeletal, functional capacity), with written report,  each 15 mins    Five times Sit to Stand Test (FTSS)  TIME: 13.96 sec performed with hands on knees  Cut off scores indicative of increased fall risk: >12 sec CVA, >16 sec PD, >13 sec vestibular (ANPTA Core Set of Outcome Measures for Adults with Neurologic Conditions, 2018)   10 Meter Walk Test: Patient instructed to walk 10 meters (32.8 ft) as  quickly and as safely as possible at their normal speed Results: 1.18 m/s (8.48 seconds)  Cut off scores:   Household Ambulator  < 0.4 m/s  Limited Community Ambulator  0.4 - 0.8 m/s  [employer]  > 0.8 m/s  Increased fall risk  < 1.83m/s  Crossing a Street  >1.102m/s  MCID 0.05 m/s (small), 0.13 m/s (moderate), 0.06 m/s (significant)  (ANPTA Core Set of Outcome Measures for Adults with Neurologic Conditions, 2018)    ABC: 51% Date tested 07/20/2024   1: Walk around the house 90   2. Walk up or down stairs 20   3. Bend over and pick up a slipper from in front of a closet floor 70   4. Reach for a small can off a shelf at eye level  90   5. Stand on tip toes and reach for something above your head 10   6. Stand on a chair and reach for something 10   7. Sweep the floor 50   8. Walk outside the house to a car parked in the driveway 80   9. Get into or out of a car 90   10. Walk across a parking lot to the mall 70   11. Walk up or down a ramp 70   12. Walk in a crowded mall where people rapidly walk past you 60   13. Are bumped into by people as you walk through the mall 40   14. Step onto or off of an escalator while you are holding onto the railing 70   15. Step onto or off an escalator while holding onto parcels such that you cannot hold onto the railing 10   16. Walk outside on icy sidewalks 0   Total: #/16 [phone]    TA- To improve functional movements patterns for everyday tasks  / TE- To improve strength, endurance, mobility, and function of specific targeted muscle groups or improve joint range of motion or improve muscle flexibility  STS from chair hand on knees  1 x 10 STS from chair holding small ball 1 x 10 (rest break after 5) -SPO2 95% following  Standing march 2 x 10 with 3# AW without UE support Standing hip extension 2 x 10 reps 3# AW  Standing on airex bouncing physioball fwd and rotational x 1 min repeated 2x  -SPT assist stepping on and off  airex  Forward step over hurdle to airex and retro step to floor x 2x10 ea  -Encouraged to complete without UE support but tendency to reach for bar with retro step  End of session SpO2 97% - transferred to transport chair  PATIENT EDUCATION: Education details: Pt will need to consider how she will know that PT is making a functional difference in her life in the ability to fully participate in all meaningful activities.  Person educated:  Education method: Explanation Education comprehension: verbalized understanding  HOME EXERCISE PROGRAM: Access Code: [email or web address] URL: [email or web address] Date: 06/29/2024 Prepared by: Chiquita Silvan  Exercises - Sit to Stand Without Arm Support  - 1 x daily - 5 x weekly - 2 sets - 10 reps - Standing Hip Extension with Counter Support  - 1 x daily - 7 x weekly - 2 sets - 10 reps - Sidestepping near counter   - 1 x daily - 7 x weekly - 2 sets - 10 reps - Seated March  - 1 x daily - 7 x weekly - 3 sets - 10 reps - Seated Long Arc Quad  - 1 x daily - 7 x weekly - 3 sets - 10 reps - Seated Hip Adduction Isometrics with Ball  - 1 x daily - 7 x weekly - 3 sets - 10 reps - 2-3 sec hold - Seated Hip Abduction with Resistance  - 1 x daily - 7 x weekly - 3 sets - 10 reps  GOALS: Goals reviewed with patient? No  SHORT TERM GOALS: Target date: 08/03/2024    ABC scale score improvement >12% to indicate improved confidence.  Baseline: 58%, 11/24 52% Goal status: ONGOING  2.  Pt to demonstrate 5xSTS from standard height chair, hands free in <16sec Baseline: <16 sec from 19 , 11/24: 13.96 sec Goal status: MET  3.  Pt to report safe and consistent performance  of weekly balance program at home.  Baseline: 07/08/24 current Goal status: ACHIEVED   LONG TERM GOALS: Target date: 08/17/2024  Pt to report regular performance in advanced home program to maintain current balance performance and gains long term.  Baseline: 07/08/24 Goal status:  Ongoing performed 1 x per week  2.  ABC Scale improvement > 25% to indicate improved confidence.  Baseline: 58%, 11/24 51% Goal status: ONGOING  3.  5xSTS <12 sec hands free from standard chair height.  Baseline:  11/24: 13.96 sec Goal status: ONGOING  4.  <9.5sec without deviation from line of progression.  Baseline:10.41 sec, 0.96 m/s, 11/24, 8.48 sec 1.18 m/s Goal status: MET  ASSESSMENT:  CLINICAL IMPRESSION: Pt arrived with good motivation for PT activities. This session consisted of progress note assessment, and therapeutic activities/exercises. Pt completed 5xSTS, ABC, and . Pt met short term goal for 5xSTS, and has made good progress toward long term goal. Pt is within 1 second of reaching it indicating improved strength and decreased fall risk. Pt met goal for indicating decreased fall risk. Pts ABC score continues to indicate decreased confidence in balance. To continue working toward 5xSTS goal with decreased UE support sit to stands were performed holding a ball. Pt was challenged but able to complete 10 with a short rest half way through. Balance was challenged with physioball bounces which provided chances for patient to adjust and regain balance. Pt required assistance stepping onto airex, but maintained balance with CGA throughout. Patient's condition has the potential to improve in response to therapy. Maximum improvement is yet to be obtained. The anticipated improvement is attainable and reasonable in a generally predictable time.     OBJECTIVE IMPAIRMENTS: Decreased knowledge of condition, decreased use of DME, decreased mobility, difficulty walking, decreased strength, decreased ROM. ACTIVITY LIMITATIONS: Lifting, standing, walking, squatting, transfers, locomotion level PARTICIPATION LIMITATIONS: Cleaning, laundry, interpersonal relationships, driving, yardwork, community activity.  PERSONAL FACTORS: Age, behavior pattern, education, past/current  experiences, transportation, profession  are also affecting patient's functional outcome.  REHAB POTENTIAL: Good CLINICAL DECISION MAKING: Medium  EVALUATION COMPLEXITY: Moderate    PLAN:  PT FREQUENCY: 1-2x/week  PT DURATION: 8 weeks  PLANNED INTERVENTIONS: 97110-Therapeutic exercises, 97530- Therapeutic activity, 97112- Neuromuscular re-education, 97535- Self Care, [id number]- Manual therapy, [id number]- Gait training, Patient/Family education, Balance training, Stair training, Vestibular training, Visual/preceptual remediation/compensation, Cognitive remediation, DME instructions, Cryotherapy, and Moist heat  PLAN FOR NEXT SESSION:  Provide O2 during session - 3L via [place] Dynamic gait: horizontal head turns, retro gait, directional change LE strengthening as indicated Dynamic balance activities  Monitor SPO2% as indicated    Leonor Rode, SPT

## 2024-07-22 ENCOUNTER — Ambulatory Visit: Admitting: Physical Therapy

## 2024-07-22 DIAGNOSIS — R2681 Unsteadiness on feet: Secondary | ICD-10-CM | POA: Diagnosis not present

## 2024-07-22 DIAGNOSIS — I700 Atherosclerosis of aorta: Secondary | ICD-10-CM | POA: Diagnosis not present

## 2024-07-22 DIAGNOSIS — R0609 Other forms of dyspnea: Secondary | ICD-10-CM | POA: Diagnosis not present

## 2024-07-22 DIAGNOSIS — J849 Interstitial pulmonary disease, unspecified: Secondary | ICD-10-CM | POA: Diagnosis not present

## 2024-07-22 DIAGNOSIS — I2510 Atherosclerotic heart disease of native coronary artery without angina pectoris: Secondary | ICD-10-CM | POA: Diagnosis not present

## 2024-07-22 DIAGNOSIS — J984 Other disorders of lung: Secondary | ICD-10-CM | POA: Diagnosis not present

## 2024-07-22 NOTE — Therapy (Signed)
 OUTPATIENT PHYSICAL THERAPY TREATMENT  Patient Name: Betty [name] MRN: [id number] DOB:04-Jan-1946, 78 y.o., female Today's Date: 07/22/2024  PCP: Gretel App, NP REFERRING PROVIDER: Luke Shade, MD    END OF SESSION:   PT End of Session - 07/22/24 1114     Visit Number 11    Number of Visits 16    Date for Recertification  08/04/24    Authorization Type [employer]    Authorization Time Period 06/09/24-08/04/24    Progress Note Due on Visit 10    PT Start Time 1015    PT Stop Time 1055    PT Time Calculation (min) 40 min    Equipment Utilized During Treatment Gait belt    Activity Tolerance Patient tolerated treatment well;No increased pain    Behavior During Therapy WFL for tasks assessed/performed           Past Medical History:  Diagnosis Date   Allergic rhinitis    never tested, fall and spring   Arthritis    hands,knees, feet   Cataracts, bilateral    not surgical yet   CHOLECYSTECTOMY, HX OF 10/08/2007   Qualifier: Diagnosis of  By: Bartley MD, Lamar Mulch    Chronic diastolic CHF (congestive heart failure) (HCC)    Chronic respiratory failure (HCC)    Diabetes mellitus without complication (HCC)    Diverticulosis    problems with frequent gas, burping   Glucose intolerance (impaired glucose tolerance)    Gout    History of chicken pox    Hyperlipidemia    Hypertension    NICM (nonischemic cardiomyopathy) (HCC) 2002   EF 25%; improved to normal - echo 4/08: EF 50-55%, mild MR, mild LAE, mild TR;    cath 3/03: normal cors, EF 40%   Obesity    Oxygen  deficiency 2017   at night   PONV (postoperative nausea and vomiting)    after wisdom tooth extraction   Restrictive lung disease    Skin cancer    skin cancers only   Sleep apnea    cpap settings 4   Past Surgical History:  Procedure Laterality Date   BILATERAL VATS ABLATION Right    biopsy done 2015- [facility]   BREAST EXCISIONAL BIOPSY Left 80's   NEG   BREAST SURGERY  80's   small cyst-  non-cancerous   CARDIAC CATHETERIZATION     CATARACT EXTRACTION W/PHACO Right 06/21/2020   Procedure: CATARACT EXTRACTION PHACO AND INTRAOCULAR LENS PLACEMENT (IOC) RIGHT DIABETIC;  Surgeon: Jaye Fallow, MD;  Location: [facility];  Service: Ophthalmology;  Laterality: Right;  4.06 0:32.8   CATARACT EXTRACTION W/PHACO Left 07/12/2020   Procedure: CATARACT EXTRACTION PHACO AND INTRAOCULAR LENS PLACEMENT (IOC) LEFT DIABETIC 6.96 00:46.3;  Surgeon: Jaye Fallow, MD;  Location: [facility];  Service: Ophthalmology;  Laterality: Left;  Diabetic - oral meds   CHOLECYSTECTOMY     choleystectomy  02/2002   COLONOSCOPY WITH PROPOFOL  N/A 12/20/2014   Procedure: COLONOSCOPY WITH PROPOFOL ;  Surgeon: Gordy CHRISTELLA Starch, MD;  Location: [facility];  Service: Gastroenterology;  Laterality: N/A;   COLONOSCOPY WITH PROPOFOL  N/A 03/22/2020   Procedure: COLONOSCOPY WITH PROPOFOL ;  Surgeon: Starch Gordy CHRISTELLA, MD;  Location: [facility];  Service: Gastroenterology;  Laterality: N/A;   CYSTECTOMY  1996   l breast   DILATION AND CURETTAGE OF UTERUS  2011   Dr. Starla at [facility]   INDUCED ABORTION     nsvd     x 2   POLYPECTOMY  03/22/2020   Procedure: POLYPECTOMY;  Surgeon: Albertus Gordy HERO, MD;  Location: [facility];  Service: Gastroenterology;;   TUBAL LIGATION  1975   VAGINAL DELIVERY     x2   Patient Active Problem List   Diagnosis Date Noted   ILD (interstitial lung disease) (HCC) 06/08/2024   Controlled type 2 diabetes mellitus with diabetic nephropathy, without long-term current use of insulin (HCC) 06/04/2024   Callus 06/04/2024   Balance problem 06/04/2024   Primary hypertension 10/30/2023   Onychomycosis 11/19/2022   Venous insufficiency 05/17/2021   Benign neoplasm of sigmoid colon    Pulmonary HTN (HCC) 10/27/2019   Restrictive lung disease 08/06/2016   Left-sided low back pain with left-sided sciatica 05/01/2016   Encounter for general adult medical examination  with abnormal findings 01/27/2016   Benign neoplasm of ascending colon    Benign neoplasm of transverse colon    Chronic diastolic heart failure (HCC) 04/08/2014   Obesity (BMI 30-39.9) 04/09/2013   Diabetes mellitus type 2, controlled (HCC) 01/07/2013   Chronic hypoxemic respiratory failure (HCC) 11/11/2012   Obstructive sleep apnea 11/11/2012   DOE (dyspnea on exertion) 08/18/2012   GOUT 12/26/2009   ALLERGIC RHINITIS, SEASONAL 10/08/2007   HYPERLIPIDEMIA, MIXED 05/02/2007    ONSET DATE: >1 year, chronic progressive REFERRING DIAG: imbalance  THERAPY DIAG:  Unsteadiness on feet  Rationale for Evaluation and Treatment: Rehabilitation  SUBJECTIVE:                                                                                                                                                                                             SUBJECTIVE STATEMENT:   Pt is doing well, busy getting ready for Thanksgiving. No other updates or recent falls.    PERTINENT HISTORY:   78yoF who is referred by PCP for progressive changes in balance. Pt reports >2 years, Pt reports history of neuropathy. Pt also functionally limited in exertion by diastolic heart failure diagnosis, attended [facility] in 2023. Pt has ILD, is starting to have O2 delivery to home October 2025, but hesitant to begin using it.   Per pulmonology note 06/15/24: Chronic respiratory failure with exertional hypoxemia. Walk test today in office shows patient qualifies for pulsing oxygen  order for POC has been sent to DME. Continue on oxygen  at 3 to 4 L to maintain O2 saturations greater than 90%. Patient is encouraged to use oxygen  with activity to maintain O2 saturations greater than 90%    PAIN:  Are you having pain? No; not typically a problem.   PRECAUTIONS: Continuous 3L O2 via [place], per pulmonology; still working on getting portable O2 unit.  WEIGHT BEARING RESTRICTIONS: No  FALLS: Has patient fallen in last 6 months?  No (1 fall in shower 3 years, ago, pt says not related to current issues)   LIVING ENVIRONMENT: Lives with: husband Lives in: house  Stairs: 3 step to enter, 1 railing (having a a second installed soon)  Has following equipment at home: [facility] for heart failure reasons, longer diagnoses  PATIENT GOALS:  not sure  OBJECTIVE:  Note: Objective measures were completed at Evaluation unless otherwise noted.  SENSATION: Has known neuropathy, will see podiatry on 06/10/24 to become established;   PATIENT SURVEYS:  ABC 58% 5xSTS: hands free: 19 15.64sec : 10.41sec, no device, lateral deviation on return, sway 0.85m/s   30sec eyes closed narrow firm (30s); narrow foam (30s); narrow incline (30sec)   BLE Strength Assessment 07/08/24     Right  Left  Hip Flexion 4+/5 4+/5  Hip Horizontal ABDCT     Hip Horizontal ADD    Hip IR (seated)  4+/5 4+/5  Hip ER (seated) 5/5 4+/5  Knee Extension 5/5 5/5  Knee Flexion (seated) 5/5 5/5  Ankle Dorsiflexion 4-/5 4-/5  Ankle Plantarflexion (seated) N/T N/T  Ankle Plantarflexion (SLS)  N/T N/T                                                                                                                              TREATMENT DATE 07/22/24:    Pt transported to clinic in transport chair: SpO2: 86% on arrival -On 3 liters oxygen  SpO2 94%  TA- To improve functional movements patterns for everyday tasks  / TE- To improve strength, endurance, mobility, and function of specific targeted muscle groups or improve joint range of motion or improve muscle flexibility  Nustep rolling hills level 3-7, x8 min  -Able to maintain conversation throughout 8 min without decrease in O2  -SpO2 after nustep: 96%  STS from chair holding 3kg ball 2 x 10   Side step GTB 2 x 6 laps  NMR: To facilitate reeducation of movement, balance, posture, coordination, and/or proprioception/kinesthetic sense.  SLS with foot elevated on dina-disc 1 x 30 sec Progressed above  to include ball hold with rotation 1 x 15 ea side  TA- To improve functional movements patterns for everyday tasks   Gait in hallway down and back with horizontal head turns, dual task naming letters/numbers  -decreased gait speed with dual task component   -SPT transporting O2 tank  Gait in hallway down and back with vertical head turns  -Pt able to maintain gait speed throughout  -SPT transporting O2 tank  End of session SpO2 94% - transferred to transport chair  PATIENT EDUCATION: Education details: Pt will need to consider how she will know that PT is making a functional difference in her life in the ability to fully participate in all meaningful activities.  Person educated:  Education method: Explanation Education comprehension: verbalized understanding  HOME EXERCISE PROGRAM: Access Code: [email or web address] URL:  [email or web address] Date: 06/29/2024 Prepared by: Chiquita Silvan  Exercises - Sit to Stand Without Arm Support  - 1 x daily - 5 x weekly - 2 sets - 10 reps - Standing Hip Extension with Counter Support  - 1 x daily - 7 x weekly - 2 sets - 10 reps - Sidestepping near counter   - 1 x daily - 7 x weekly - 2 sets - 10 reps - Seated March  - 1 x daily - 7 x weekly - 3 sets - 10 reps - Seated Long Arc Quad  - 1 x daily - 7 x weekly - 3 sets - 10 reps - Seated Hip Adduction Isometrics with Ball  - 1 x daily - 7 x weekly - 3 sets - 10 reps - 2-3 sec hold - Seated Hip Abduction with Resistance  - 1 x daily - 7 x weekly - 3 sets - 10 reps  GOALS: Goals reviewed with patient? No  SHORT TERM GOALS: Target date: 08/03/2024    ABC scale score improvement >12% to indicate improved confidence.  Baseline: 58%, 11/24 52% Goal status: ONGOING  2.  Pt to demonstrate 5xSTS from standard height chair, hands free in <16sec Baseline: <16 sec from 19 , 11/24: 13.96 sec Goal status: MET  3.  Pt to report safe and consistent performance of weekly balance program at home.   Baseline: 07/08/24 current Goal status: ACHIEVED   LONG TERM GOALS: Target date: 08/17/2024  Pt to report regular performance in advanced home program to maintain current balance performance and gains long term.  Baseline: 07/08/24 Goal status: Ongoing performed 1 x per week  2.  ABC Scale improvement > 25% to indicate improved confidence.  Baseline: 58%, 11/24 51% Goal status: ONGOING  3.  5xSTS <12 sec hands free from standard chair height.  Baseline:  11/24: 13.96 sec Goal status: ONGOING  4.  <9.5sec without deviation from line of progression.  Baseline:10.41 sec, 0.96 m/s, 11/24, 8.48 sec 1.18 m/s Goal status: MET  ASSESSMENT:  CLINICAL IMPRESSION: Pt arrived with good motivation for PT activities. Today's session focused on endurance, LE strength, and dynamic balance/gait. Pt was encouraged by ability to hold a conversation for the length of nustep training. SPT encouraged pt that endurance will only improve with continued use of oxygen . Pt progressed sit to stands to include increased weight with 3kg ball. Pt also did well with static balance on dina-disc and was able to maintain balance for 30 sec. Exercise was modified to include rotational component which was completed with no LOB. Horizontal head turns with dual task of naming letters/ numbers provided a moderate challenge noted by decreased gait speed. Pt will continue to benefit from skilled physical therapy intervention to address impairments, improve QOL, and attain therapy goals.       OBJECTIVE IMPAIRMENTS: Decreased knowledge of condition, decreased use of DME, decreased mobility, difficulty walking, decreased strength, decreased ROM. ACTIVITY LIMITATIONS: Lifting, standing, walking, squatting, transfers, locomotion level PARTICIPATION LIMITATIONS: Cleaning, laundry, interpersonal relationships, driving, yardwork, community activity.  PERSONAL FACTORS: Age, behavior pattern, education, past/current experiences,  transportation, profession  are also affecting patient's functional outcome.  REHAB POTENTIAL: Good CLINICAL DECISION MAKING: Medium  EVALUATION COMPLEXITY: Moderate    PLAN:  PT FREQUENCY: 1-2x/week  PT DURATION: 8 weeks  PLANNED INTERVENTIONS: 97110-Therapeutic exercises, 97530- Therapeutic activity, 97112- Neuromuscular re-education, 97535- Self Care, [id number]- Manual therapy, [phone]- Gait training, Patient/Family education, Balance training, Stair training, Vestibular training, Visual/preceptual remediation/compensation, Cognitive remediation, DME instructions, Cryotherapy,  and Moist heat  PLAN FOR NEXT SESSION:  Provide O2 during session - 3L via [place] (Monitor SPO2% as indicated) Dynamic gait: focusing on directional changes Dynamic balance activities     Leonor Rode, SPT

## 2024-07-27 ENCOUNTER — Ambulatory Visit: Admitting: Physical Therapy

## 2024-07-27 DIAGNOSIS — R2681 Unsteadiness on feet: Secondary | ICD-10-CM | POA: Insufficient documentation

## 2024-07-27 NOTE — Therapy (Signed)
 OUTPATIENT PHYSICAL THERAPY TREATMENT  Patient Name: Betty [name] MRN: [id number] DOB:[birth year], 78 y.o., female Today's Date: 07/27/2024  PCP: Gretel App, NP REFERRING PROVIDER: Luke Shade, MD    END OF SESSION:   PT End of Session - 07/27/24 1001     Visit Number 12    Number of Visits 16    Date for Recertification  08/04/24    Authorization Type [employer]    Authorization Time Period 06/09/24-08/04/24    Progress Note Due on Visit 10    PT Start Time 1015    PT Stop Time 1055    PT Time Calculation (min) 40 min    Equipment Utilized During Treatment Gait belt    Activity Tolerance Patient tolerated treatment well    Behavior During Therapy WFL for tasks assessed/performed          Past Medical History:  Diagnosis Date   Allergic rhinitis    never tested, fall and spring   Arthritis    hands,knees, feet   Cataracts, bilateral    not surgical yet   CHOLECYSTECTOMY, HX OF 10/08/2007   Qualifier: Diagnosis of  By: Bartley MD, Lamar Mulch    Chronic diastolic CHF (congestive heart failure) (HCC)    Chronic respiratory failure (HCC)    Diabetes mellitus without complication (HCC)    Diverticulosis    problems with frequent gas, burping   Glucose intolerance (impaired glucose tolerance)    Gout    History of chicken pox    Hyperlipidemia    Hypertension    NICM (nonischemic cardiomyopathy) (HCC) 2002   EF 25%; improved to normal - echo 4/08: EF 50-55%, mild MR, mild LAE, mild TR;    cath 3/03: normal cors, EF 40%   Obesity    Oxygen  deficiency 2017   at night   PONV (postoperative nausea and vomiting)    after wisdom tooth extraction   Restrictive lung disease    Skin cancer    skin cancers only   Sleep apnea    cpap settings 4   Past Surgical History:  Procedure Laterality Date   BILATERAL VATS ABLATION Right    biopsy done 2015- [facility]   BREAST EXCISIONAL BIOPSY Left 80's   NEG   BREAST SURGERY  80's   small cyst- non-cancerous    CARDIAC CATHETERIZATION     CATARACT EXTRACTION W/PHACO Right 06/21/2020   Procedure: CATARACT EXTRACTION PHACO AND INTRAOCULAR LENS PLACEMENT (IOC) RIGHT DIABETIC;  Surgeon: Jaye Fallow, MD;  Location: [facility];  Service: Ophthalmology;  Laterality: Right;  4.06 0:32.8   CATARACT EXTRACTION W/PHACO Left 07/12/2020   Procedure: CATARACT EXTRACTION PHACO AND INTRAOCULAR LENS PLACEMENT (IOC) LEFT DIABETIC 6.96 00:46.3;  Surgeon: Jaye Fallow, MD;  Location: [facility];  Service: Ophthalmology;  Laterality: Left;  Diabetic - oral meds   CHOLECYSTECTOMY     choleystectomy  02/2002   COLONOSCOPY WITH PROPOFOL  N/A 12/20/2014   Procedure: COLONOSCOPY WITH PROPOFOL ;  Surgeon: Gordy CHRISTELLA Starch, MD;  Location: [facility];  Service: Gastroenterology;  Laterality: N/A;   COLONOSCOPY WITH PROPOFOL  N/A 03/22/2020   Procedure: COLONOSCOPY WITH PROPOFOL ;  Surgeon: Starch Gordy CHRISTELLA, MD;  Location: [facility];  Service: Gastroenterology;  Laterality: N/A;   CYSTECTOMY  1996   l breast   DILATION AND CURETTAGE OF UTERUS  2011   Dr. Starla at [facility]   INDUCED ABORTION     nsvd     x 2   POLYPECTOMY  03/22/2020  Procedure: POLYPECTOMY;  Surgeon: Albertus Gordy HERO, MD;  Location: [facility];  Service: Gastroenterology;;   TUBAL LIGATION  1975   VAGINAL DELIVERY     x2   Patient Active Problem List   Diagnosis Date Noted   ILD (interstitial lung disease) (HCC) 06/08/2024   Controlled type 2 diabetes mellitus with diabetic nephropathy, without long-term current use of insulin (HCC) 06/04/2024   Callus 06/04/2024   Balance problem 06/04/2024   Primary hypertension 10/30/2023   Onychomycosis 11/19/2022   Venous insufficiency 05/17/2021   Benign neoplasm of sigmoid colon    Pulmonary HTN (HCC) 10/27/2019   Restrictive lung disease 08/06/2016   Left-sided low back pain with left-sided sciatica 05/01/2016   Encounter for general adult medical examination with abnormal  findings 01/27/2016   Benign neoplasm of ascending colon    Benign neoplasm of transverse colon    Chronic diastolic heart failure (HCC) 04/08/2014   Obesity (BMI 30-39.9) 04/09/2013   Diabetes mellitus type 2, controlled (HCC) 01/07/2013   Chronic hypoxemic respiratory failure (HCC) 11/11/2012   Obstructive sleep apnea 11/11/2012   DOE (dyspnea on exertion) 08/18/2012   GOUT 12/26/2009   ALLERGIC RHINITIS, SEASONAL 10/08/2007   HYPERLIPIDEMIA, MIXED 05/02/2007    ONSET DATE: >1 year, chronic progressive REFERRING DIAG: imbalance  THERAPY DIAG:  Unsteadiness on feet  Rationale for Evaluation and Treatment: Rehabilitation  SUBJECTIVE:                                                                                                                                                                                             SUBJECTIVE STATEMENT:   Pt hosted Thanksgiving and set up Christmas this weekend. Pt states she is feeling worn out.   PERTINENT HISTORY:   78yoF who is referred by PCP for progressive changes in balance. Pt reports >2 years, Pt reports history of neuropathy. Pt also functionally limited in exertion by diastolic heart failure diagnosis, attended [facility] in 2023. Pt has ILD, is starting to have O2 delivery to home October 2025, but hesitant to begin using it.   Per pulmonology note 06/15/24: Chronic respiratory failure with exertional hypoxemia. Walk test today in office shows patient qualifies for pulsing oxygen  order for POC has been sent to DME. Continue on oxygen  at 3 to 4 L to maintain O2 saturations greater than 90%. Patient is encouraged to use oxygen  with activity to maintain O2 saturations greater than 90%    PAIN:  Are you having pain? No; not typically a problem.   PRECAUTIONS: Continuous 3L O2 via [place], per pulmonology; still working on getting portable O2 unit.   WEIGHT BEARING  RESTRICTIONS: No  FALLS: Has patient fallen in last 6 months? No (1  fall in shower 3 years, ago, pt says not related to current issues)   LIVING ENVIRONMENT: Lives with: husband Lives in: house  Stairs: 3 step to enter, 1 railing (having a a second installed soon)  Has following equipment at home: [facility] for heart failure reasons, longer diagnoses  PATIENT GOALS:  not sure  OBJECTIVE:  Note: Objective measures were completed at Evaluation unless otherwise noted.  SENSATION: Has known neuropathy, will see podiatry on 06/10/24 to become established;   PATIENT SURVEYS:  ABC 58% 5xSTS: hands free: 19 15.64sec : 10.41sec, no device, lateral deviation on return, sway 0.71m/s   30sec eyes closed narrow firm (30s); narrow foam (30s); narrow incline (30sec)   BLE Strength Assessment 07/08/24     Right  Left  Hip Flexion 4+/5 4+/5  Hip Horizontal ABDCT     Hip Horizontal ADD    Hip IR (seated)  4+/5 4+/5  Hip ER (seated) 5/5 4+/5  Knee Extension 5/5 5/5  Knee Flexion (seated) 5/5 5/5  Ankle Dorsiflexion 4-/5 4-/5  Ankle Plantarflexion (seated) N/T N/T  Ankle Plantarflexion (SLS)  N/T N/T                                                                                                                              TREATMENT DATE 07/27/24:    Pt transported to clinic in transport chair: SpO2: 85% on arrival -On 3 liters oxygen  SpO2 96%  TA- To improve functional movements patterns for everyday tasks   Nustep double hills level 3-7, x8 min   -SpO2 after nustep: 94%  STS from chair holding 3kg ball 2 x 12   Lateral Step up to step trainer without UE support 2 x 10 ea LE  NMR: To facilitate reeducation of movement, balance, posture, coordination, and/or proprioception/kinesthetic sense.  Activity Description: Lateral facing on airex, semi tandem stance with rotational and fwd reach to blaze pods Activity Setting:  Home Base Number of Pods:  4 Cycles/Sets:  1 set ea side Duration (Time or Hit Count):  1:00  Lateral facing at bar, fwd step over  hurdle on to airex followed by retro step 1 x 8 ea LE  -Occasional UE support, more so when leading with left LE  -Standing breaks provided between ea LE  End of session SpO2 94% - transferred to transport chair  Pt required occasional rest breaks due fatigue, PT was attentive to when pt appeared to be tired or winded in order to prevent excessive fatigue.   Unless otherwise stated, CGA was provided and gait belt donned in order to ensure pt safety   PATIENT EDUCATION: Education details: Pt will need to consider how she will know that PT is making a functional difference in her life in the ability to fully participate in all meaningful activities.  Person educated:  Education method: Explanation Education comprehension: verbalized understanding  HOME EXERCISE PROGRAM: Access Code: [email or web address] URL: [email or web address] Date: 06/29/2024 Prepared by: Chiquita Silvan  Exercises - Sit to Stand Without Arm Support  - 1 x daily - 5 x weekly - 2 sets - 10 reps - Standing Hip Extension with Counter Support  - 1 x daily - 7 x weekly - 2 sets - 10 reps - Sidestepping near counter   - 1 x daily - 7 x weekly - 2 sets - 10 reps - Seated March  - 1 x daily - 7 x weekly - 3 sets - 10 reps - Seated Long Arc Quad  - 1 x daily - 7 x weekly - 3 sets - 10 reps - Seated Hip Adduction Isometrics with Ball  - 1 x daily - 7 x weekly - 3 sets - 10 reps - 2-3 sec hold - Seated Hip Abduction with Resistance  - 1 x daily - 7 x weekly - 3 sets - 10 reps  GOALS: Goals reviewed with patient? No  SHORT TERM GOALS: Target date: 08/03/2024    ABC scale score improvement >12% to indicate improved confidence.  Baseline: 58%, 11/24 52% Goal status: ONGOING  2.  Pt to demonstrate 5xSTS from standard height chair, hands free in <16sec Baseline: <16 sec from 19 , 11/24: 13.96 sec Goal status: MET  3.  Pt to report safe and consistent performance of weekly balance program at home.  Baseline: 07/08/24  current Goal status: ACHIEVED   LONG TERM GOALS: Target date: 08/17/2024  Pt to report regular performance in advanced home program to maintain current balance performance and gains long term.  Baseline: 07/08/24 Goal status: Ongoing performed 1 x per week  2.  ABC Scale improvement > 25% to indicate improved confidence.  Baseline: 58%, 11/24 51% Goal status: ONGOING  3.  5xSTS <12 sec hands free from standard chair height.  Baseline:  11/24: 13.96 sec Goal status: ONGOING  4.  <9.5sec without deviation from line of progression.  Baseline:10.41 sec, 0.96 m/s, 11/24, 8.48 sec 1.18 m/s Goal status: MET  ASSESSMENT:  CLINICAL IMPRESSION:  Pt arrived with good motivation for PT activities, but presented with holiday activity related fatigue. Prolonged sitting and standing rest provided throughout session to promote recovery between activities. Despite fatigue pt was able to tolerate increased reps for sit to stand. Balance was challenged with blaze pod activities, but patient recovered well when balance was lost. Pt was most challenged this session with stepping over hurdle onto airex. Occasional UE support was required to maintain balance. Pt had difficulty clearing left LE on retro step, with a tendency to circumduct LE. Pt will continue to benefit from skilled physical therapy intervention to address impairments, improve QOL, and attain therapy goals.       OBJECTIVE IMPAIRMENTS: Decreased knowledge of condition, decreased use of DME, decreased mobility, difficulty walking, decreased strength, decreased ROM. ACTIVITY LIMITATIONS: Lifting, standing, walking, squatting, transfers, locomotion level PARTICIPATION LIMITATIONS: Cleaning, laundry, interpersonal relationships, driving, yardwork, community activity.  PERSONAL FACTORS: Age, behavior pattern, education, past/current experiences, transportation, profession  are also affecting patient's functional outcome.  REHAB POTENTIAL:  Good CLINICAL DECISION MAKING: Medium  EVALUATION COMPLEXITY: Moderate    PLAN:  PT FREQUENCY: 1-2x/week  PT DURATION: 8 weeks  PLANNED INTERVENTIONS: 97110-Therapeutic exercises, 97530- Therapeutic activity, 97112- Neuromuscular re-education, 97535- Self Care, [id number]- Manual therapy, [phone]- Gait training, Patient/Family education, Balance training, Stair training, Vestibular training, Visual/preceptual remediation/compensation, Cognitive remediation, DME instructions, Cryotherapy, and Moist heat  PLAN FOR NEXT SESSION:  Provide O2 during session - 3L via [place] (Monitor SPO2% as indicated) Dynamic gait: focusing on directional changes Dynamic balance activities     Leonor Rode, SPT   This entire session was performed under direct supervision and direction of a licensed therapist/therapist assistant . I have personally read, edited and approve of the note as written.    This licensed clinician was present and actively directing care throughout the session at all times.  Lonni KATHEE Gainer PT ,DPT Physical Therapist- [place]  [facility]

## 2024-07-28 NOTE — Telephone Encounter (Signed)
 ATCP x1, left VM to call back

## 2024-07-29 ENCOUNTER — Ambulatory Visit: Admitting: Physical Therapy

## 2024-07-29 DIAGNOSIS — R2681 Unsteadiness on feet: Secondary | ICD-10-CM

## 2024-07-29 NOTE — Therapy (Unsigned)
 OUTPATIENT PHYSICAL THERAPY TREATMENT  Patient Name: Betty [name] MRN: [id number] DOB:[birth year], 78 y.o., female Today's Date: 07/29/2024  PCP: Gretel App, NP REFERRING PROVIDER: Luke Shade, MD    END OF SESSION:   PT End of Session - 07/29/24 1026     Visit Number 13    Number of Visits 16    Date for Recertification  08/04/24    Authorization Type [employer]    Authorization Time Period 06/09/24-08/04/24    Progress Note Due on Visit 10    PT Start Time 1015    PT Stop Time 1055    PT Time Calculation (min) 40 min    Equipment Utilized During Treatment Gait belt    Activity Tolerance Patient tolerated treatment well    Behavior During Therapy WFL for tasks assessed/performed          Past Medical History:  Diagnosis Date   Allergic rhinitis    never tested, fall and spring   Arthritis    hands,knees, feet   Cataracts, bilateral    not surgical yet   CHOLECYSTECTOMY, HX OF 10/08/2007   Qualifier: Diagnosis of  By: Bartley MD, Lamar Mulch    Chronic diastolic CHF (congestive heart failure) (HCC)    Chronic respiratory failure (HCC)    Diabetes mellitus without complication (HCC)    Diverticulosis    problems with frequent gas, burping   Glucose intolerance (impaired glucose tolerance)    Gout    History of chicken pox    Hyperlipidemia    Hypertension    NICM (nonischemic cardiomyopathy) (HCC) 2002   EF 25%; improved to normal - echo 4/08: EF 50-55%, mild MR, mild LAE, mild TR;    cath 3/03: normal cors, EF 40%   Obesity    Oxygen  deficiency 2017   at night   PONV (postoperative nausea and vomiting)    after wisdom tooth extraction   Restrictive lung disease    Skin cancer    skin cancers only   Sleep apnea    cpap settings 4   Past Surgical History:  Procedure Laterality Date   BILATERAL VATS ABLATION Right    biopsy done 2015- [facility]   BREAST EXCISIONAL BIOPSY Left 80's   NEG   BREAST SURGERY  80's   small cyst- non-cancerous    CARDIAC CATHETERIZATION     CATARACT EXTRACTION W/PHACO Right 06/21/2020   Procedure: CATARACT EXTRACTION PHACO AND INTRAOCULAR LENS PLACEMENT (IOC) RIGHT DIABETIC;  Surgeon: Jaye Fallow, MD;  Location: [facility];  Service: Ophthalmology;  Laterality: Right;  4.06 0:32.8   CATARACT EXTRACTION W/PHACO Left 07/12/2020   Procedure: CATARACT EXTRACTION PHACO AND INTRAOCULAR LENS PLACEMENT (IOC) LEFT DIABETIC 6.96 00:46.3;  Surgeon: Jaye Fallow, MD;  Location: [facility];  Service: Ophthalmology;  Laterality: Left;  Diabetic - oral meds   CHOLECYSTECTOMY     choleystectomy  02/2002   COLONOSCOPY WITH PROPOFOL  N/A 12/20/2014   Procedure: COLONOSCOPY WITH PROPOFOL ;  Surgeon: Gordy CHRISTELLA Starch, MD;  Location: [facility];  Service: Gastroenterology;  Laterality: N/A;   COLONOSCOPY WITH PROPOFOL  N/A 03/22/2020   Procedure: COLONOSCOPY WITH PROPOFOL ;  Surgeon: Starch Gordy CHRISTELLA, MD;  Location: [facility];  Service: Gastroenterology;  Laterality: N/A;   CYSTECTOMY  1996   l breast   DILATION AND CURETTAGE OF UTERUS  2011   Dr. Starla at [facility]   INDUCED ABORTION     nsvd     x 2   POLYPECTOMY  03/22/2020  Procedure: POLYPECTOMY;  Surgeon: Albertus Gordy HERO, MD;  Location: [facility];  Service: Gastroenterology;;   TUBAL LIGATION  1975   VAGINAL DELIVERY     x2   Patient Active Problem List   Diagnosis Date Noted   ILD (interstitial lung disease) (HCC) 06/08/2024   Controlled type 2 diabetes mellitus with diabetic nephropathy, without long-term current use of insulin (HCC) 06/04/2024   Callus 06/04/2024   Balance problem 06/04/2024   Primary hypertension 10/30/2023   Onychomycosis 11/19/2022   Venous insufficiency 05/17/2021   Benign neoplasm of sigmoid colon    Pulmonary HTN (HCC) 10/27/2019   Restrictive lung disease 08/06/2016   Left-sided low back pain with left-sided sciatica 05/01/2016   Encounter for general adult medical examination with abnormal  findings 01/27/2016   Benign neoplasm of ascending colon    Benign neoplasm of transverse colon    Chronic diastolic heart failure (HCC) 04/08/2014   Obesity (BMI 30-39.9) 04/09/2013   Diabetes mellitus type 2, controlled (HCC) 01/07/2013   Chronic hypoxemic respiratory failure (HCC) 11/11/2012   Obstructive sleep apnea 11/11/2012   DOE (dyspnea on exertion) 08/18/2012   GOUT 12/26/2009   ALLERGIC RHINITIS, SEASONAL 10/08/2007   HYPERLIPIDEMIA, MIXED 05/02/2007    ONSET DATE: >1 year, chronic progressive REFERRING DIAG: imbalance  THERAPY DIAG:  Unsteadiness on feet  Rationale for Evaluation and Treatment: Rehabilitation  SUBJECTIVE:                                                                                                                                                                                             SUBJECTIVE STATEMENT:   Pt reports depending on weather, Friday she will be having a pulmonology appt. No other updates.    PERTINENT HISTORY:   78yoF who is referred by PCP for progressive changes in balance. Pt reports >2 years, Pt reports history of neuropathy. Pt also functionally limited in exertion by diastolic heart failure diagnosis, attended [facility] in 2023. Pt has ILD, is starting to have O2 delivery to home October 2025, but hesitant to begin using it.   Per pulmonology note 06/15/24: Chronic respiratory failure with exertional hypoxemia. Walk test today in office shows patient qualifies for pulsing oxygen  order for POC has been sent to DME. Continue on oxygen  at 3 to 4 L to maintain O2 saturations greater than 90%. Patient is encouraged to use oxygen  with activity to maintain O2 saturations greater than 90%    PAIN:  Are you having pain? No; not typically a problem.   PRECAUTIONS: Continuous 3L O2 via [place], per pulmonology; still working on getting portable O2 unit.   WEIGHT  BEARING RESTRICTIONS: No  FALLS: Has patient fallen in last 6 months? No (1  fall in shower 3 years, ago, pt says not related to current issues)   LIVING ENVIRONMENT: Lives with: husband Lives in: house  Stairs: 3 step to enter, 1 railing (having a a second installed soon)  Has following equipment at home: [facility] for heart failure reasons, longer diagnoses  PATIENT GOALS:  not sure  OBJECTIVE:  Note: Objective measures were completed at Evaluation unless otherwise noted.  SENSATION: Has known neuropathy, will see podiatry on 06/10/24 to become established;   PATIENT SURVEYS:  ABC 58% 5xSTS: hands free: 19 15.64sec : 10.41sec, no device, lateral deviation on return, sway 0.68m/s   30sec eyes closed narrow firm (30s); narrow foam (30s); narrow incline (30sec)   BLE Strength Assessment 07/08/24     Right  Left  Hip Flexion 4+/5 4+/5  Hip Horizontal ABDCT     Hip Horizontal ADD    Hip IR (seated)  4+/5 4+/5  Hip ER (seated) 5/5 4+/5  Knee Extension 5/5 5/5  Knee Flexion (seated) 5/5 5/5  Ankle Dorsiflexion 4-/5 4-/5  Ankle Plantarflexion (seated) N/T N/T  Ankle Plantarflexion (SLS)  N/T N/T                                                                                                                              TREATMENT DATE 07/29/24:    Pt transported to clinic in transport chair: SpO2: 88% on arrival -On 3 liters oxygen  SpO2 94%  TA- To improve functional movements patterns for everyday tasks   Nustep double hills level 3-8, x8 min   -SpO2 after nustep: 94%  Fwd step up on 6 in step without UE support 1 x 8 ea LE  Fwd step up with knee march 1 x 10 ea LE (Single UE support)  Lateral steps along airex balance beam 2 x 6 lap (down and back length of beam= 1 lap)  NMR: To facilitate reeducation of movement, balance, posture, coordination, and/or proprioception/kinesthetic sense.  Activity Description: Standing on airex, two chairs on either side with blaze pods, and two blaze pods overhead. Overhead reaching and mini squat to tap  pod Activity Setting:  Random Number of Pods:  4 Cycles/Sets:  1x  Duration (Time or Hit Count):  2:00 min   End of session SpO2 96% - transferred to transport chair  Pt required occasional rest breaks due fatigue, PT was attentive to when pt appeared to be tired or winded in order to prevent excessive fatigue.   Unless otherwise stated, CGA was provided and gait belt donned in order to ensure pt safety   PATIENT EDUCATION: Education details: Pt will need to consider how she will know that PT is making a functional difference in her life in the ability to fully participate in all meaningful activities.  Person educated:  Education method: Explanation Education comprehension: verbalized understanding  HOME EXERCISE PROGRAM: Access Code: [email or web address]  URL: [email or web address] Date: 06/29/2024 Prepared by: Chiquita Silvan  Exercises - Sit to Stand Without Arm Support  - 1 x daily - 5 x weekly - 2 sets - 10 reps - Standing Hip Extension with Counter Support  - 1 x daily - 7 x weekly - 2 sets - 10 reps - Sidestepping near counter   - 1 x daily - 7 x weekly - 2 sets - 10 reps - Seated March  - 1 x daily - 7 x weekly - 3 sets - 10 reps - Seated Long Arc Quad  - 1 x daily - 7 x weekly - 3 sets - 10 reps - Seated Hip Adduction Isometrics with Ball  - 1 x daily - 7 x weekly - 3 sets - 10 reps - 2-3 sec hold - Seated Hip Abduction with Resistance  - 1 x daily - 7 x weekly - 3 sets - 10 reps  GOALS: Goals reviewed with patient? No  SHORT TERM GOALS: Target date: 08/03/2024    ABC scale score improvement >12% to indicate improved confidence.  Baseline: 58%, 11/24 52% Goal status: ONGOING  2.  Pt to demonstrate 5xSTS from standard height chair, hands free in <16sec Baseline: <16 sec from 19 , 11/24: 13.96 sec Goal status: MET  3.  Pt to report safe and consistent performance of weekly balance program at home.  Baseline: 07/08/24 current Goal status: ACHIEVED   LONG TERM  GOALS: Target date: 08/17/2024  Pt to report regular performance in advanced home program to maintain current balance performance and gains long term.  Baseline: 07/08/24 Goal status: Ongoing performed 1 x per week  2.  ABC Scale improvement > 25% to indicate improved confidence.  Baseline: 58%, 11/24 51% Goal status: ONGOING  3.  5xSTS <12 sec hands free from standard chair height.  Baseline:  11/24: 13.96 sec Goal status: ONGOING  4.  <9.5sec without deviation from line of progression.  Baseline:10.41 sec, 0.96 m/s, 11/24, 8.48 sec 1.18 m/s Goal status: MET  ASSESSMENT:  CLINICAL IMPRESSION:  Pt arrived with good motivation for PT activities. Today's session focused on functional balance activities, including step ups, side stepping on uneven surface, and reaching overhead. Step ups were a challenge without UE support with pt occasionally reaching for bar to prevent LOB. UE support was provided for following step ups with march to encourage single leg support. Pt continues to do well with blaze pod activities, maintaining balance with overhead reaching and mini squats. Pt will continue to benefit from skilled physical therapy intervention to address impairments, improve QOL, and attain therapy goals.       OBJECTIVE IMPAIRMENTS: Decreased knowledge of condition, decreased use of DME, decreased mobility, difficulty walking, decreased strength, decreased ROM. ACTIVITY LIMITATIONS: Lifting, standing, walking, squatting, transfers, locomotion level PARTICIPATION LIMITATIONS: Cleaning, laundry, interpersonal relationships, driving, yardwork, community activity.  PERSONAL FACTORS: Age, behavior pattern, education, past/current experiences, transportation, profession  are also affecting patient's functional outcome.  REHAB POTENTIAL: Good CLINICAL DECISION MAKING: Medium  EVALUATION COMPLEXITY: Moderate    PLAN:  PT FREQUENCY: 1-2x/week  PT DURATION: 8 weeks  PLANNED  INTERVENTIONS: 97110-Therapeutic exercises, 97530- Therapeutic activity, 97112- Neuromuscular re-education, 97535- Self Care, [id number]- Manual therapy, [phone]- Gait training, Patient/Family education, Balance training, Stair training, Vestibular training, Visual/preceptual remediation/compensation, Cognitive remediation, DME instructions, Cryotherapy, and Moist heat  PLAN FOR NEXT SESSION:  Provide O2 during session - 3L via [place] (Monitor SPO2% as indicated) Dynamic gait: focusing on directional changes  -fwd/bwd gait Dynamic  balance activities    Leonor Rode, SPT

## 2024-07-30 ENCOUNTER — Ambulatory Visit: Admitting: Nurse Practitioner

## 2024-07-31 ENCOUNTER — Ambulatory Visit: Payer: Self-pay | Admitting: Nurse Practitioner

## 2024-07-31 ENCOUNTER — Ambulatory Visit: Admitting: Emergency Medicine

## 2024-08-03 ENCOUNTER — Ambulatory Visit

## 2024-08-05 ENCOUNTER — Ambulatory Visit

## 2024-08-11 ENCOUNTER — Ambulatory Visit: Admitting: Physical Therapy

## 2024-08-11 DIAGNOSIS — R2681 Unsteadiness on feet: Secondary | ICD-10-CM | POA: Diagnosis not present

## 2024-08-11 NOTE — Therapy (Unsigned)
 OUTPATIENT PHYSICAL THERAPY TREATMENT/DISCHARGE/ RECERT  Patient Name: Betty [name] MRN: [id number] DOB:08/04/1946, 78 y.o., female Today's Date: 08/12/2024  PCP: Gretel App, NP REFERRING PROVIDER: Luke Shade, MD   Dates of reporting period  07/20/2024  to 08/11/2024  END OF SESSION:   PT End of Session - 08/12/24 0934     Visit Number 14    Number of Visits 16    Date for Recertification  08/12/24    Authorization Type [employer]    Authorization Time Period 06/09/24-08/04/24    Progress Note Due on Visit 10    PT Start Time 1100    PT Stop Time 1140    PT Time Calculation (min) 40 min    Equipment Utilized During Treatment Gait belt    Activity Tolerance Patient tolerated treatment well    Behavior During Therapy WFL for tasks assessed/performed          Past Medical History:  Diagnosis Date   Allergic rhinitis    never tested, fall and spring   Arthritis    hands,knees, feet   Cataracts, bilateral    not surgical yet   CHOLECYSTECTOMY, HX OF 10/08/2007   Qualifier: Diagnosis of  By: Bartley MD, Lamar Mulch    Chronic diastolic CHF (congestive heart failure) (HCC)    Chronic respiratory failure (HCC)    Diabetes mellitus without complication (HCC)    Diverticulosis    problems with frequent gas, burping   Glucose intolerance (impaired glucose tolerance)    Gout    History of chicken pox    Hyperlipidemia    Hypertension    NICM (nonischemic cardiomyopathy) (HCC) 2002   EF 25%; improved to normal - echo 4/08: EF 50-55%, mild MR, mild LAE, mild TR;    cath 3/03: normal cors, EF 40%   Obesity    Oxygen  deficiency 2017   at night   PONV (postoperative nausea and vomiting)    after wisdom tooth extraction   Restrictive lung disease    Skin cancer    skin cancers only   Sleep apnea    cpap settings 4   Past Surgical History:  Procedure Laterality Date   BILATERAL VATS ABLATION Right    biopsy done 2015- [facility]   BREAST EXCISIONAL BIOPSY Left  80's   NEG   BREAST SURGERY  80's   small cyst- non-cancerous   CARDIAC CATHETERIZATION     CATARACT EXTRACTION W/PHACO Right 06/21/2020   Procedure: CATARACT EXTRACTION PHACO AND INTRAOCULAR LENS PLACEMENT (IOC) RIGHT DIABETIC;  Surgeon: Jaye Fallow, MD;  Location: [facility];  Service: Ophthalmology;  Laterality: Right;  4.06 0:32.8   CATARACT EXTRACTION W/PHACO Left 07/12/2020   Procedure: CATARACT EXTRACTION PHACO AND INTRAOCULAR LENS PLACEMENT (IOC) LEFT DIABETIC 6.96 00:46.3;  Surgeon: Jaye Fallow, MD;  Location: [facility];  Service: Ophthalmology;  Laterality: Left;  Diabetic - oral meds   CHOLECYSTECTOMY     choleystectomy  02/2002   COLONOSCOPY WITH PROPOFOL  N/A 12/20/2014   Procedure: COLONOSCOPY WITH PROPOFOL ;  Surgeon: Gordy CHRISTELLA Starch, MD;  Location: [facility];  Service: Gastroenterology;  Laterality: N/A;   COLONOSCOPY WITH PROPOFOL  N/A 03/22/2020   Procedure: COLONOSCOPY WITH PROPOFOL ;  Surgeon: Starch Gordy CHRISTELLA, MD;  Location: [facility];  Service: Gastroenterology;  Laterality: N/A;   CYSTECTOMY  1996   l breast   DILATION AND CURETTAGE OF UTERUS  2011   Dr[facility] Starla [facility]   INDUCED ABORTION     nsvd  x 2   POLYPECTOMY  03/22/2020   Procedure: POLYPECTOMY;  Surgeon: Albertus Gordy HERO, MD;  Location: [facility];  Service: Gastroenterology;;   TUBAL LIGATION  1975   VAGINAL DELIVERY     x2   Patient Active Problem List   Diagnosis Date Noted   ILD (interstitial lung disease) (HCC) 06/08/2024   Controlled type 2 diabetes mellitus with diabetic nephropathy, without long-term current use of insulin (HCC) 06/04/2024   Callus 06/04/2024   Balance problem 06/04/2024   Primary hypertension 10/30/2023   Onychomycosis 11/19/2022   Venous insufficiency 05/17/2021   Benign neoplasm of sigmoid colon    Pulmonary HTN (HCC) 10/27/2019   Restrictive lung disease 08/06/2016   Left-sided low back pain with left-sided sciatica 05/01/2016    Encounter for general adult medical examination with abnormal findings 01/27/2016   Benign neoplasm of ascending colon    Benign neoplasm of transverse colon    Chronic diastolic heart failure (HCC) 04/08/2014   Obesity (BMI 30-39.9) 04/09/2013   Diabetes mellitus type 2, controlled (HCC) 01/07/2013   Chronic hypoxemic respiratory failure (HCC) 11/11/2012   Obstructive sleep apnea 11/11/2012   DOE (dyspnea on exertion) 08/18/2012   GOUT 12/26/2009   ALLERGIC RHINITIS, SEASONAL 10/08/2007   HYPERLIPIDEMIA, MIXED 05/02/2007    ONSET DATE: >1 year, chronic progressive REFERRING DIAG: imbalance  THERAPY DIAG:  Unsteadiness on feet  Rationale for Evaluation and Treatment: Rehabilitation  SUBJECTIVE:                                                                                                                                                                                             SUBJECTIVE STATEMENT:   Pt has been doing well. She has been busy helping her husband who has been recovering from a hip replacement.    PERTINENT HISTORY:   78yoF who is referred by PCP for progressive changes in balance. Pt reports >2 years, Pt reports history of neuropathy. Pt also functionally limited in exertion by diastolic heart failure diagnosis, attended [facility] in 2023. Pt has ILD, is starting to have O2 delivery to home October 2025, but hesitant to begin using it.   Per pulmonology note 06/15/24: Chronic respiratory failure with exertional hypoxemia. Walk test today in office shows patient qualifies for pulsing oxygen  order for POC has been sent to DME. Continue on oxygen  at 3 to 4 L to maintain O2 saturations greater than 90%. Patient is encouraged to use oxygen  with activity to maintain O2 saturations greater than 90%    PAIN:  Are you having pain? No; not typically a problem.   PRECAUTIONS: Continuous 3L O2 via  [place], per pulmonology; still working on getting portable O2 unit.   WEIGHT  BEARING RESTRICTIONS: No  FALLS: Has patient fallen in last 6 months? No (1 fall in shower 3 years, ago, pt says not related to current issues)   LIVING ENVIRONMENT: Lives with: husband Lives in: house  Stairs: 3 step to enter, 1 railing (having a a second installed soon)  Has following equipment at home: [facility] for heart failure reasons, longer diagnoses  PATIENT GOALS:  not sure  OBJECTIVE:  Note: Objective measures were completed at Evaluation unless otherwise noted.  SENSATION: Has known neuropathy, will see podiatry on 06/10/24 to become established;   PATIENT SURVEYS:  ABC 58% 5xSTS: hands free: 19 15.64sec : 10.41sec, no device, lateral deviation on return, sway 0.67m/s   30sec eyes closed narrow firm (30s); narrow foam (30s); narrow incline (30sec)   BLE Strength Assessment 07/08/24     Right  Left  Hip Flexion 4+/5 4+/5  Hip Horizontal ABDCT     Hip Horizontal ADD    Hip IR (seated)  4+/5 4+/5  Hip ER (seated) 5/5 4+/5  Knee Extension 5/5 5/5  Knee Flexion (seated) 5/5 5/5  Ankle Dorsiflexion 4-/5 4-/5  Ankle Plantarflexion (seated) N/T N/T  Ankle Plantarflexion (SLS)  N/T N/T                                                                                                                              TREATMENT DATE 08/12/2024:    Pt transported to clinic in transport chair: SpO2: 89% on arrival -On 3 liters oxygen  SpO2 95%  Physical Performance Test or Measurement: a  physical performance test(s) or measurement (eg,  musculoskeletal, functional capacity), with written report,  each 15 mins    Five times Sit to Stand Test (FTSS)  TIME: 13.98 sec  Cut off scores indicative of increased fall risk: >12 sec CVA, >16 sec PD, >13 sec vestibular (ANPTA Core Set of Outcome Measures for Adults with Neurologic Conditions, 2018)   ABC: 58% ABC scale: The Activities-Specific Balance Confidence (ABC) Scale    No confidence<->completely confident     How confident  are you that you will not lose your balance or become unsteady when you . . .       Date tested 16-Dec   1: Walk around the house 90   2. Walk up or down stairs 30   3. Bend over and pick up a slipper from in front of a closet floor 70   4. Reach for a small can off a shelf at eye level 90   5. Stand on tip toes and reach for something above your head 50   6. Stand on a chair and reach for something 10   7. Sweep the floor 30   8. Walk outside the house to a car parked in the driveway 90   9. Get into or out of a car 80   10.  Walk across a parking lot to the mall 70   11. Walk up or down a ramp 70   12. Walk in a crowded mall where people rapidly walk past you 80   13. Are bumped into by people as you walk through the mall 60   14. Step onto or off of an escalator while you are holding onto the railing 80   15. Step onto or off an escalator while holding onto parcels such that you cannot hold onto the railing 30   16. Walk outside on icy sidewalks 0   Total: #/16 [phone]    TE- To improve strength, endurance, mobility, and function of specific targeted muscle groups or improve joint range of motion or improve muscle flexibility   Standing hip abduction 1 x 10, progressed to GTB resistance at ankle 1 x 10  Standing hip extension 1x10, progressed to GTB resistance at ankle 1 x 10  Side steps with resistance GTB 2 x 6 laps (down and back length of bar= 1 lap)  NMR: To facilitate reeducation of movement, balance, posture, coordination, and/or proprioception/kinesthetic sense.   Single leg stance 2 x 30 sec  -Intermittent UE support   Marches without UE support 2 x 10 ea LE  TA- To improve functional movements patterns for everyday tasks   Nustep rolling hills level 4-8, x6 min   End of session SpO2 96% - transferred to transport chair   Unless otherwise stated, CGA was provided and gait belt donned in order to ensure pt safety   PATIENT EDUCATION: Education details: Pt  will need to consider how she will know that PT is making a functional difference in her life in the ability to fully participate in all meaningful activities.  Person educated:  Education method: Explanation Education comprehension: verbalized understanding  HOME EXERCISE PROGRAM: Access Code: [email or web address] URL: [email or web address] Date: 08/11/2024  Prepared by: Lonni Gainer  Exercises - Sit to Stand Without Arm Support  - 1 x daily - 5 x weekly - 2 sets - 10 reps - Seated Long Arc Quad  - 1 x daily - 7 x weekly - 3 sets - 10 reps - Single Leg Stance with Support  - 1 x daily - 7 x weekly - 3 sets - 30 sec hold - Standing 3-Way Leg Reach with Resistance at Ankles and Counter Support  - 1 x daily - 7 x weekly - 2 sets - 10 reps - Side Stepping with Resistance at Ankles and Counter Support  - 1 x daily - 7 x weekly - 2 sets - 10 reps - Marching Near Counter  - 1 x daily - 7 x weekly - 2 sets - 10 reps   GOALS: Goals reviewed with patient? No  SHORT TERM GOALS: Target date: 08/03/2024    ABC scale score improvement >12% to indicate improved confidence.  Baseline: 58%, 11/24 52%, 12/16: 58% Goal status: NOT MET  2.  Pt to demonstrate 5xSTS from standard height chair, hands free in <16sec Baseline: <16 sec from 19 , 11/24: 13.96 sec Goal status: MET  3.  Pt to report safe and consistent performance of weekly balance program at home.  Baseline: 07/08/24 current Goal status: ACHIEVED   LONG TERM GOALS: Target date: 08/17/2024  Pt to report regular performance in advanced home program to maintain current balance performance and gains long term.  Baseline: 07/08/24 Goal status: Ongoing performed 1 x per week  2.  ABC Scale improvement > 25% to  indicate improved confidence.  Baseline: 58%, 11/24 51%, 12/16 58% Goal status: NOT MET  3.  5xSTS <12 sec hands free from standard chair height.  Baseline:  11/24: 13.96 sec, 12/16 13.98 sec Goal status: NOT MET  4.   <9.5sec without deviation from line of progression.  Baseline:10.41 sec, 0.96 m/s, 11/24, 8.48 sec 1.18 m/s Goal status: MET  ASSESSMENT:  CLINICAL IMPRESSION:  Pt arrived for recert at this appt, but based on goals met and progress made pt was discharged with updated HEP to continue at home. Pt has made great improvements since start of PT progressing from heavy UE use to none for sit to stands. Pt did not meet goal set but was within 1 sec of reaching. Pts ABC score still reflects low confidence, but pt reports feeling better with daily activities and has been able to assist husband through THA recovery with no reported difficulty. HEP focus is LE strengthening and balance in order to maintain improvements made in PT. Pt encouraged to wear supplemental oxygen  when completing these exercises to ensure safe oxygenation. While patient did not meet all above goals, she has the tools to continue working on balance and strength at home.      OBJECTIVE IMPAIRMENTS: Decreased knowledge of condition, decreased use of DME, decreased mobility, difficulty walking, decreased strength, decreased ROM. ACTIVITY LIMITATIONS: Lifting, standing, walking, squatting, transfers, locomotion level PARTICIPATION LIMITATIONS: Cleaning, laundry, interpersonal relationships, driving, yardwork, community activity.  PERSONAL FACTORS: Age, behavior pattern, education, past/current experiences, transportation, profession  are also affecting patient's functional outcome.  REHAB POTENTIAL: Good CLINICAL DECISION MAKING: Medium  EVALUATION COMPLEXITY: Moderate    PLAN:  PT FREQUENCY: 1-2x/week  PT DURATION: 8 weeks  PLANNED INTERVENTIONS: 97110-Therapeutic exercises, 97530- Therapeutic activity, 97112- Neuromuscular re-education, 97535- Self Care, [id number]- Manual therapy, [phone]- Gait training, Patient/Family education, Balance training, Stair training, Vestibular training, Visual/preceptual remediation/compensation,  Cognitive remediation, DME instructions, Cryotherapy, and Moist heat  PLAN FOR NEXT SESSION:  Provide O2 during session - 3L via [place] (Monitor SPO2% as indicated) Dynamic gait: focusing on directional changes  -fwd/bwd gait Dynamic balance activities    Leonor Rode, SPT   This entire session was performed under direct supervision and direction of a licensed estate agent . I have personally read, edited and approve of the note as written.    This licensed clinician was present and actively directing care throughout the session at all times.  Lonni KATHEE Gainer PT ,DPT Physical Therapist- [place]  [facility]

## 2024-08-12 ENCOUNTER — Encounter: Payer: Self-pay | Admitting: Nurse Practitioner

## 2024-08-12 ENCOUNTER — Ambulatory Visit: Admitting: Nurse Practitioner

## 2024-08-12 VITALS — BP 110/67 | HR 90 | Temp 98.2°F | Ht 65.0 in | Wt 200.2 lb

## 2024-08-12 DIAGNOSIS — E1121 Type 2 diabetes mellitus with diabetic nephropathy: Secondary | ICD-10-CM | POA: Diagnosis not present

## 2024-08-12 DIAGNOSIS — Z7985 Long-term (current) use of injectable non-insulin antidiabetic drugs: Secondary | ICD-10-CM | POA: Diagnosis not present

## 2024-08-12 DIAGNOSIS — J849 Interstitial pulmonary disease, unspecified: Secondary | ICD-10-CM

## 2024-08-12 DIAGNOSIS — E782 Mixed hyperlipidemia: Secondary | ICD-10-CM

## 2024-08-12 DIAGNOSIS — I10 Essential (primary) hypertension: Secondary | ICD-10-CM | POA: Diagnosis not present

## 2024-08-12 DIAGNOSIS — J9611 Chronic respiratory failure with hypoxia: Secondary | ICD-10-CM

## 2024-08-12 NOTE — Assessment & Plan Note (Signed)
 Blood pressure is well controlled with Losartan 25 mg daily and Carvedilol 12.5 mg twice daily with no new symptoms of chest pain or dizziness. Continue current regimen.

## 2024-08-12 NOTE — Progress Notes (Signed)
 Leron Glance, NP-C Phone: [phone]  Betty [name] is a 78 y.o. female who presents today for follow up.   Discussed the use of AI scribe software for clinical note transcription with the patient, who gave verbal consent to proceed.  History of Present Illness   Betty [name] is a 78 year old female with interstitial lung disease who presents for routine follow-up.  She is under regular care with pulmonology for evaluation of possible interstitial lung disease. Her oxygen  levels are generally around 90%, and she uses oxygen  at night with her CPAP machine. She checks her oxygen  levels 10 to 12 times a day and uses oxygen  if levels drop. She was trialed on an inhaler but has not felt the need to use it. She does not use oxygen  when going out, as she finds it cumbersome. She experiences shortness of breath when walking or exerting herself, but not at rest.  She recently completed physical therapy for balance issues and gait problems, which helped her realize the need for higher seating. She has incorporated exercises from PT into her home routine.  She takes Trulicity  for diabetes, and her last A1c was 7.3%. She does not regularly check her blood sugar and has no symptoms of hypoglycemia such as excessive thirst, urination, or dizziness.  She takes carvedilol  and losartan  for blood pressure management. She experiences some swelling in her left ankle and occasionally in her right. She has not been taking Lasix  regularly due to its diuretic effects interfering with her daily activities.  She takes pravastatin  every other day due to muscle aches. She has not reported any new problems or concerns during this visit.      Tobacco Use History[1]  Medications Ordered Prior to Encounter[2]   ROS see history of present illness  Objective  Physical Exam Vitals:   08/12/24 1609  BP: 110/67  Pulse: 90  Temp: 98.2 F (36.8 C)  SpO2: 90%    BP Readings from Last 3 Encounters:   08/12/24 110/67  06/15/24 124/72  06/08/24 102/62   Wt Readings from Last 3 Encounters:  08/12/24 200 lb 3.2 oz (90.8 kg)  06/15/24 201 lb 8 oz (91.4 kg)  06/08/24 202 lb 6.4 oz (91.8 kg)    Physical Exam Constitutional:      General: She is not in acute distress.    Appearance: Normal appearance.  HENT:     Head: Normocephalic.  Cardiovascular:     Rate and Rhythm: Normal rate and regular rhythm.     Heart sounds: Normal heart sounds.  Pulmonary:     Effort: Pulmonary effort is normal.     Breath sounds: Normal breath sounds.  Skin:    General: Skin is warm and dry.  Neurological:     General: No focal deficit present.     Mental Status: She is alert.  Psychiatric:        Mood and Affect: Mood normal.        Behavior: Behavior normal.      Assessment/Plan: Please see individual problem list.  Chronic hypoxemic respiratory failure (HCC) Assessment & Plan: Chronic hypoxemic respiratory failure secondary to interstitial lung disease. Oxygen  saturation remains stable at 90%. Pulmonology follow-up is ongoing. Continue pulmonology follow-up and counseled on consistent oxygen  use. Regularly monitor oxygen  levels.   ILD (interstitial lung disease) (HCC) Assessment & Plan: Encouraged consistent oxygen  use. Albuterol  inhaler as needed. Follow up with Pulmonology as scheduled.   Primary hypertension Assessment & Plan: Blood pressure is well  controlled with Losartan  25 mg daily and Carvedilol  12.5 mg twice daily with no new symptoms of chest pain or dizziness. Continue current regimen.   Controlled type 2 diabetes mellitus with diabetic nephropathy, without long-term current use of insulin (HCC) Assessment & Plan: Type 2 diabetes with nephropathy is present. A1c was 7.3% in October, within target. She reports no symptoms of hyperglycemia or hypoglycemia. Continue Trulicity .   HYPERLIPIDEMIA, MIXED Assessment & Plan: She is on Pravastatin  40 mg every other day and  Zetia  10 mg daily, experiences chronic muscle aches but tolerating the regimen. Continue current regimen.       Return in about 6 months (around 02/10/2025) for Follow up.   Leron Glance, NP-C [facility] [place]    [1]  Social History Tobacco Use  Smoking Status Never  Smokeless Tobacco Never  [2]  Current Outpatient Medications on File Prior to Visit  Medication Sig Dispense Refill   acetaminophen  (TYLENOL ) 500 MG tablet Take 500 mg by mouth every 8 (eight) hours as needed.     albuterol  (VENTOLIN  HFA) 108 (90 Base) MCG/ACT inhaler Inhale 2 puffs into the lungs every 6 (six) hours as needed for wheezing or shortness of breath. 8 g 3   aspirin  81 MG EC tablet Take 1 tablet (81 mg total) by mouth daily. 90 tablet 2   CALCIUM -MAGNESIUM-VITAMIN D PO Take 1 tablet by mouth daily.     carvedilol  (COREG ) 12.5 MG tablet TAKE 1 TABLET (12.5MG  TOTAL) BY MOUTH TWICE A DAY WITH MEALS 180 tablet 1   clobetasol  ointment (TEMOVATE ) 0.05 % Place 0.25 Applicator vaginally once weekly. 30 g 5   Coenzyme Q10 (CO Q 10 PO) Take 200 mg by mouth at bedtime.      Colchicine  (MITIGARE ) 0.6 MG CAPS Day 1: take 1.2 mg (2 tablets) by mouth at the first sign of flare, followed by 0.6 mg (1 tablet) by mouth after 1 hour. Day 2 and thereafter: take 0.6 mg (1 tablet) by mouth daily until flare has resolved. 30 capsule 0   Dulaglutide  (TRULICITY ) 0.75 MG/0.5ML SOAJ Inject 0.75 mg into the skin once a week. 6 mL 1   estradiol  (ESTRACE ) 0.1 MG/GM vaginal cream Place 0.25 Applicatorfuls vaginally 3 (three) times a week. 90 g 3   ezetimibe  (ZETIA ) 10 MG tablet Take 1 tablet (10 mg total) by mouth daily. 90 tablet 1   Fish Oil OIL Take 1,200 mg by mouth 2 (two) times daily. GUMMIES     fluticasone  (FLONASE ) 50 MCG/ACT nasal spray SPRAY 2 SPRAYS INTO EACH NOSTRIL EVERY DAY 48 mL 2   furosemide  (LASIX ) 40 MG tablet Take 1 tablet (40 mg total) by mouth 3 (three) times a week. 30 tablet 3   losartan   (COZAAR ) 50 MG tablet Take 0.5 tablets (25 mg total) by mouth daily. 45 tablet 3   multivitamin (THERAGRAN) per tablet Take 1 tablet by mouth daily.     OXYGEN  Inhale 3 L into the lungs at bedtime.     polyvinyl alcohol (LIQUIFILM TEARS) 1.4 % ophthalmic solution Place 1 drop into both eyes every morning.     pravastatin  (PRAVACHOL ) 40 MG tablet Take it every other day 90 tablet 3   No current facility-administered medications on file prior to visit.

## 2024-08-12 NOTE — Assessment & Plan Note (Signed)
 Type 2 diabetes with nephropathy is present. A1c was 7.3% in October, within target. She reports no symptoms of hyperglycemia or hypoglycemia. Continue Trulicity .

## 2024-08-12 NOTE — Assessment & Plan Note (Signed)
 She is on Pravastatin  40 mg every other day and Zetia  10 mg daily, experiences chronic muscle aches but tolerating the regimen. Continue current regimen.

## 2024-08-12 NOTE — Assessment & Plan Note (Signed)
 Encouraged consistent oxygen  use. Albuterol  inhaler as needed. Follow up with Pulmonology as scheduled.

## 2024-08-12 NOTE — Assessment & Plan Note (Signed)
 Chronic hypoxemic respiratory failure secondary to interstitial lung disease. Oxygen  saturation remains stable at 90%. Pulmonology follow-up is ongoing. Continue pulmonology follow-up and counseled on consistent oxygen  use. Regularly monitor oxygen  levels.

## 2024-08-13 ENCOUNTER — Ambulatory Visit: Admitting: Physical Therapy

## 2024-08-17 ENCOUNTER — Ambulatory Visit

## 2024-08-19 ENCOUNTER — Ambulatory Visit

## 2024-08-24 ENCOUNTER — Ambulatory Visit

## 2024-08-26 ENCOUNTER — Ambulatory Visit: Admitting: Physical Therapy

## 2024-08-31 ENCOUNTER — Ambulatory Visit

## 2024-09-02 ENCOUNTER — Ambulatory Visit

## 2024-09-04 ENCOUNTER — Ambulatory Visit: Admitting: Emergency Medicine

## 2024-09-04 ENCOUNTER — Encounter: Payer: Self-pay | Admitting: Emergency Medicine

## 2024-09-04 VITALS — BP 110/60 | HR 94 | Ht 65.0 in | Wt 197.2 lb

## 2024-09-04 DIAGNOSIS — J849 Interstitial pulmonary disease, unspecified: Secondary | ICD-10-CM

## 2024-09-04 DIAGNOSIS — J9611 Chronic respiratory failure with hypoxia: Secondary | ICD-10-CM

## 2024-09-04 DIAGNOSIS — G4733 Obstructive sleep apnea (adult) (pediatric): Secondary | ICD-10-CM | POA: Diagnosis not present

## 2024-09-04 NOTE — Assessment & Plan Note (Signed)
 Continue her CPAP nightly.  Good clinical benefit and good compliance confirmed today.

## 2024-09-04 NOTE — Patient Instructions (Signed)
 We reviewed your CT scan of the chest from November.  This is stable compared with your priors.  Good news. We reviewed your pulmonary function testing from October.  There is evidence for some restriction on your breathing that is overall stable going back for 3 years. Your echocardiogram from November does not show any evidence of pulmonary hypertension or lung related changes.  This is good news. Please continue to use your oxygen  at 3 L/min with exertion.  Our goal is to keep your SpO2 > 90%. Continue your CPAP every night with oxygen  bled in as you have been using it. We will plan to repeat your CT scan of the chest in November 2026. Follow Dr. Shelah in November so we can review that scan.  Please call sooner if you have any problems.

## 2024-09-04 NOTE — Assessment & Plan Note (Signed)
 Longstanding and stable on imaging, stable clinically.  Associated with hypoxemia but no PAH on echocardiogram (although there was some vascular remodeling on her biopsy).  Plan to follow her clinically and radiographically.  I will get a repeat CT in November.  Hold off on repeat PFT and echocardiogram for now.

## 2024-09-04 NOTE — Progress Notes (Signed)
 "  Subjective:    Patient ID: Betty [name], female    DOB: 1946/06/25, 79 y.o.   MRN: [id number]  HPI  ROV 09/04/2024 --Ms. Horgan is a 41 with a history of interstitial lung disease and surgical lung biopsy consistent with parabronchial fibrotic change without active inflammation and some arteriopathic PAH.  She has documented exertional hypoxemia, restrictive lung disease on pulmonary function testing.  She has OSA on CPAP. Since I have seen her she has undergone repeat pulmonary function testing, echocardiogram, high-resolution CT scan of the chest. She uses oxygen  at 3 L/min, she checks her SpO2, has desats w activity, sometimes at rest. She is pretty sedentary. Had a URI in late December, saw more desats then  Compliant with CPAP 8 cmH2O +3 L/min; gets good clinical benefit.   Pulmonary function testing 06/08/2024 reviewed by me showed evidence for restriction on Spiriva without a bronchodilator response, normal lung volumes that would suggest pseudonormalization, possible coexisting obstruction, decreased diffusion capacity that corrects to the normal range when adjusted for albuterol  volume  Echocardiogram done 07/16/2024 reviewed, shows LVEF 55-60% without any regional wall motion abnormalities.  Grade 1 diastolic dysfunction, normal RV size and function.  The TR jet was inadequate for assessing PA pressures  High-resolution CT scan of the chest 06/29/2024 reviewed by me shows no mediastinal adenopathy, postsurgical scarring in the right upper and right lower lobes, trace pleural reticular densities and bronchiolectasis in the posterior lateral aspect of the lower lobes without any ground glass, architectural distortion or honeycomb change.    Review of Systems As per HPI  Past Medical History:  Diagnosis Date   Allergic rhinitis    never tested, fall and spring   Arthritis    hands,knees, feet   Cataracts, bilateral    not surgical yet   CHOLECYSTECTOMY, HX OF 10/08/2007    Qualifier: Diagnosis of  By: Bartley MD, Lamar Mulch    Chronic diastolic CHF (congestive heart failure) (HCC)    Chronic respiratory failure (HCC)    Diabetes mellitus without complication (HCC)    Diverticulosis    problems with frequent gas, burping   Glucose intolerance (impaired glucose tolerance)    Gout    History of chicken pox    Hyperlipidemia    Hypertension    NICM (nonischemic cardiomyopathy) (HCC) 2002   EF 25%; improved to normal - echo 4/08: EF 50-55%, mild MR, mild LAE, mild TR;    cath 3/03: normal cors, EF 40%   Obesity    Oxygen  deficiency 2017   at night   PONV (postoperative nausea and vomiting)    after wisdom tooth extraction   Restrictive lung disease    Skin cancer    skin cancers only   Sleep apnea    cpap settings 4     Family History  Problem Relation Age of Onset   Lymphoma Mother 24   Cancer Mother    COPD Father        was a smoker   Stroke Father    Pneumonia Father    Diabetes Father    Hyperlipidemia Father    Heart disease Father    Heart attack Father    Hypertension Father    Diabetes Sister    Depression Sister    Liver disease Sister    Meniere's disease Brother    Hypertension Brother    Schizophrenia Daughter    Bladder Cancer Maternal Grandmother    Cancer Maternal Grandmother    Leukemia Maternal  Grandfather    Cancer Maternal Grandfather    Diabetes Paternal Grandfather    Heart disease Paternal Grandfather    Diabetes Paternal Grandmother    Depression Sister    Diabetes Sister    Hypertension Brother    Hypertension Daughter    Mental illness Daughter        schizophrenia   Colon cancer Neg Hx    Stomach cancer Neg Hx    Breast cancer Neg Hx    Colon polyps Neg Hx    Esophageal cancer Neg Hx    Rectal cancer Neg Hx      Social History   Socioeconomic History   Marital status: Married    Spouse name: Not on file   Number of children: 2   Years of education: Not on file   Highest education level:  Bachelor's degree (e.g., BA, AB, BS)  Occupational History   Occupation: Retired Magazine Features Editor: retired   Occupation: Works at [employer]  Tobacco Use   Smoking status: Never   Smokeless tobacco: Never  Vaping Use   Vaping status: Never Used  Substance and Sexual Activity   Alcohol use: No    Alcohol/week: 0.0 standard drinks of alcohol   Drug use: No   Sexual activity: Not Currently    Birth control/protection: Post-menopausal, Surgical  Other Topics Concern   Not on file  Social History Narrative   Lives in [place] with husband. Worked at runner, broadcasting/film/video at [employer]. 2 children. No pets.   Social Drivers of Health   Tobacco Use: Low Risk (09/04/2024)   Patient History    Smoking Tobacco Use: Never    Smokeless Tobacco Use: Never    Passive Exposure: Not on file  Financial Resource Strain: Low Risk  (06/10/2024)   Received from [facility]   Overall Financial Resource Strain (CARDIA)    Difficulty of Paying Living Expenses: Not very hard  Food Insecurity: No Food Insecurity (06/10/2024)   Received from [facility]   Epic    Within the past 12 months, you worried that your food would run out before you got the money to buy more.: Never true    Within the past 12 months, the food you bought just didn't last and you didn't have money to get more.: Never true  Transportation Needs: No Transportation Needs (06/10/2024)   Received from [facility] - Transportation    In the past 12 months, has lack of transportation kept you from medical appointments or from getting medications?: No    Lack of Transportation (Non-Medical): No  Physical Activity: Inactive (04/21/2024)   Exercise Vital Sign    Days of Exercise per Week: 0 days    Minutes of Exercise per Session: 0 min  Stress: No Stress Concern Present (04/21/2024)   [employer] of Occupational [employer] - Occupational Stress Questionnaire    Feeling  of Stress: Not at all  Social Connections: Socially Integrated (04/21/2024)   Social Connection and Isolation Panel    Frequency of Communication with Friends and Family: More than three times a week    Frequency of Social Gatherings with Friends and Family: More than three times a week    Attends Religious Services: More than 4 times per year    Active Member of [employer] or Organizations: Yes    Attends Banker Meetings: 1 to 4 times per year    Marital Status: Married  Intimate  Partner Violence: Not At Risk (04/22/2024)   Epic    Fear of Current or Ex-Partner: No    Emotionally Abused: No    Physically Abused: No    Sexually Abused: No  Depression (PHQ2-9): Medium Risk (08/12/2024)   Depression (PHQ2-9)    PHQ-2 Score: 7  Alcohol Screen: Low Risk (04/21/2024)   Alcohol Screen    Last Alcohol Screening Score (AUDIT): 0  Housing: Low Risk  (06/10/2024)   Received from [facility]   Epic    In the last 12 months, was there a time when you were not able to pay the mortgage or rent on time?: No    In the past 12 months, how many times have you moved where you were living?: 0    At any time in the past 12 months, were you homeless or living in a shelter (including now)?: No  Utilities: Not At Risk (06/10/2024)   Received from [facility]   Epic    In the past 12 months has the electric, gas, oil, or water company threatened to shut off services in your home?: No  Health Literacy: Adequate Health Literacy (04/22/2024)   B1300 Health Literacy    Frequency of need for help with medical instructions: Never     Allergies  Allergen Reactions   Amoxicillin Rash    REACTION: RASH   Etodolac Other (See Comments)    Bloody stool    Hydrochlorothiazide  Other (See Comments)    Increases gout flare   Meloxicam Other (See Comments)    constipation   Sulfa Antibiotics Other (See Comments)    rash   Allopurinol     Metformin  And Related      diarrhea   Rosuvastatin  Other (See Comments)    extreme joint pain   Sulfonamide Derivatives     REACTION: RASH   Jardiance  [Empagliflozin ] Other (See Comments)    Yeast infection   Lasix  [Furosemide ] Other (See Comments)    Pt states it dried everything out of me     Outpatient Medications Prior to Visit  Medication Sig Dispense Refill   acetaminophen  (TYLENOL ) 500 MG tablet Take 500 mg by mouth every 8 (eight) hours as needed.     albuterol  (VENTOLIN  HFA) 108 (90 Base) MCG/ACT inhaler Inhale 2 puffs into the lungs every 6 (six) hours as needed for wheezing or shortness of breath. 8 g 3   aspirin  81 MG EC tablet Take 1 tablet (81 mg total) by mouth daily. 90 tablet 2   CALCIUM -MAGNESIUM-VITAMIN D PO Take 1 tablet by mouth daily.     carvedilol  (COREG ) 12.5 MG tablet TAKE 1 TABLET (12.5MG  TOTAL) BY MOUTH TWICE A DAY WITH MEALS 180 tablet 1   clobetasol  ointment (TEMOVATE ) 0.05 % Place 0.25 Applicator vaginally once weekly. 30 g 5   Coenzyme Q10 (CO Q 10 PO) Take 200 mg by mouth at bedtime.      Colchicine  (MITIGARE ) 0.6 MG CAPS Day 1: take 1.2 mg (2 tablets) by mouth at the first sign of flare, followed by 0.6 mg (1 tablet) by mouth after 1 hour. Day 2 and thereafter: take 0.6 mg (1 tablet) by mouth daily until flare has resolved. 30 capsule 0   Dulaglutide  (TRULICITY ) 0.75 MG/0.5ML SOAJ Inject 0.75 mg into the skin once a week. 6 mL 1   estradiol  (ESTRACE ) 0.1 MG/GM vaginal cream Place 0.25 Applicatorfuls vaginally 3 (three) times a week. 90 g 3   ezetimibe  (ZETIA ) 10 MG  tablet Take 1 tablet (10 mg total) by mouth daily. 90 tablet 1   Fish Oil OIL Take 1,200 mg by mouth 2 (two) times daily. GUMMIES     fluticasone  (FLONASE ) 50 MCG/ACT nasal spray SPRAY 2 SPRAYS INTO EACH NOSTRIL EVERY DAY 48 mL 2   furosemide  (LASIX ) 40 MG tablet Take 1 tablet (40 mg total) by mouth 3 (three) times a week. 30 tablet 3   losartan  (COZAAR ) 50 MG tablet Take 0.5 tablets (25 mg total) by mouth daily. 45  tablet 3   multivitamin (THERAGRAN) per tablet Take 1 tablet by mouth daily.     OXYGEN  Inhale 3 L into the lungs at bedtime.     polyvinyl alcohol (LIQUIFILM TEARS) 1.4 % ophthalmic solution Place 1 drop into both eyes every morning.     pravastatin  (PRAVACHOL ) 40 MG tablet Take it every other day 90 tablet 3   No facility-administered medications prior to visit.           Objective:   Physical Exam Vitals:   09/04/24 0907  BP: 110/60  Pulse: 94  SpO2: 92%  Weight: 197 lb 3.2 oz (89.4 kg)  Height: 5' 5 (1.651 m)   Gen: Pleasant, obese woman, in no distress,  normal affect  ENT: No lesions,  mouth clear,  oropharynx clear, no postnasal drip  Neck: No JVD, no stridor  Lungs: No use of accessory muscles, distant, no crackles or wheezing on normal respiration, no wheeze on forced expiration  Cardiovascular: RRR, heart sounds normal, no murmur or gallops, no peripheral edema  Musculoskeletal: No deformities, no cyanosis or clubbing  Neuro: alert, awake, non focal  Skin: Warm, no lesions or rash       Assessment & Plan:  ILD (interstitial lung disease) (HCC) Longstanding and stable on imaging, stable clinically.  Associated with hypoxemia but no PAH on echocardiogram (although there was some vascular remodeling on her biopsy).  Plan to follow her clinically and radiographically.  I will get a repeat CT in November.  Hold off on repeat PFT and echocardiogram for now.  Chronic hypoxemic respiratory failure (HCC) Discussed the importance of good oxygen  compliance with her.  She is not wearing proactively with exertion and we talked about the importance of this.  Obstructive sleep apnea Continue her CPAP nightly.  Good clinical benefit and good compliance confirmed today.  I personally spent a total of 33 minutes in the care of the patient today including preparing to see the patient, getting/reviewing separately obtained history, performing a medically appropriate  exam/evaluation, counseling and educating, placing orders, documenting clinical information in the EHR, independently interpreting results, and communicating results.   Lamar Chris, MD, PhD 09/04/2024, 9:39 AM [facility] [phone] or if no answer before 7:00PM call [phone] For any issues after 7:00PM please call eLink [phone]   "

## 2024-09-04 NOTE — Assessment & Plan Note (Signed)
 Discussed the importance of good oxygen  compliance with her.  She is not wearing proactively with exertion and we talked about the importance of this.

## 2024-09-07 ENCOUNTER — Ambulatory Visit

## 2024-09-09 ENCOUNTER — Ambulatory Visit

## 2024-09-14 ENCOUNTER — Ambulatory Visit

## 2024-09-16 ENCOUNTER — Ambulatory Visit

## 2024-09-21 ENCOUNTER — Ambulatory Visit

## 2024-09-23 ENCOUNTER — Ambulatory Visit

## 2024-09-28 ENCOUNTER — Ambulatory Visit

## 2024-09-30 ENCOUNTER — Ambulatory Visit

## 2024-09-30 ENCOUNTER — Ambulatory Visit: Admitting: Emergency Medicine

## 2024-10-05 ENCOUNTER — Ambulatory Visit

## 2024-10-07 ENCOUNTER — Ambulatory Visit

## 2024-10-12 ENCOUNTER — Ambulatory Visit

## 2024-10-14 ENCOUNTER — Ambulatory Visit

## 2025-02-10 ENCOUNTER — Ambulatory Visit: Admitting: Nurse Practitioner

## 2025-04-27 ENCOUNTER — Ambulatory Visit
